# Patient Record
Sex: Male | Born: 1985 | Race: Asian | Hispanic: No | Marital: Single | Smoking: Former smoker
Health system: Southern US, Community
[De-identification: ages and names within clinical notes are randomized; demographics above are authoritative.]

## PROBLEM LIST (undated history)

## (undated) DIAGNOSIS — F32A Depression, unspecified: Secondary | ICD-10-CM

## (undated) DIAGNOSIS — R4182 Altered mental status, unspecified: Secondary | ICD-10-CM

## (undated) DIAGNOSIS — F329 Major depressive disorder, single episode, unspecified: Secondary | ICD-10-CM

## (undated) DIAGNOSIS — E119 Type 2 diabetes mellitus without complications: Secondary | ICD-10-CM

## (undated) DIAGNOSIS — F333 Major depressive disorder, recurrent, severe with psychotic symptoms: Secondary | ICD-10-CM

## (undated) DIAGNOSIS — R5381 Other malaise: Secondary | ICD-10-CM

## (undated) HISTORY — PX: EXPLORATORY LAPAROTOMY: SUR591

## (undated) HISTORY — PX: APPENDECTOMY: SHX54

---

## 2008-12-11 ENCOUNTER — Emergency Department (HOSPITAL_COMMUNITY): Admission: EM | Admit: 2008-12-11 | Discharge: 2008-12-11 | Payer: Self-pay | Admitting: Emergency Medicine

## 2008-12-11 ENCOUNTER — Emergency Department (HOSPITAL_COMMUNITY): Admission: EM | Admit: 2008-12-11 | Discharge: 2008-12-12 | Payer: Self-pay | Admitting: Emergency Medicine

## 2008-12-12 ENCOUNTER — Inpatient Hospital Stay (HOSPITAL_COMMUNITY): Admission: AD | Admit: 2008-12-12 | Discharge: 2008-12-31 | Payer: Self-pay | Admitting: *Deleted

## 2008-12-12 ENCOUNTER — Ambulatory Visit: Payer: Self-pay | Admitting: *Deleted

## 2010-07-08 LAB — DIFFERENTIAL
Eosinophils Relative: 2 % (ref 0–5)
Lymphocytes Relative: 16 % (ref 12–46)
Monocytes Absolute: 0.6 10*3/uL (ref 0.1–1.0)
Monocytes Relative: 6 % (ref 3–12)
Neutro Abs: 8 10*3/uL — ABNORMAL HIGH (ref 1.7–7.7)

## 2010-07-08 LAB — CBC
HCT: 36.3 % — ABNORMAL LOW (ref 39.0–52.0)
HCT: 42.4 % (ref 39.0–52.0)
Hemoglobin: 12.4 g/dL — ABNORMAL LOW (ref 13.0–17.0)
Hemoglobin: 14.4 g/dL (ref 13.0–17.0)
MCHC: 33.9 g/dL (ref 30.0–36.0)
RBC: 3.97 MIL/uL — ABNORMAL LOW (ref 4.22–5.81)
RBC: 4.64 MIL/uL (ref 4.22–5.81)
RDW: 12 % (ref 11.5–15.5)
RDW: 12.4 % (ref 11.5–15.5)
WBC: 7.2 10*3/uL (ref 4.0–10.5)

## 2010-07-08 LAB — POCT I-STAT, CHEM 8
BUN: 11 mg/dL (ref 6–23)
Calcium, Ion: 1.21 mmol/L (ref 1.12–1.32)
Chloride: 104 mEq/L (ref 96–112)
Glucose, Bld: 68 mg/dL — ABNORMAL LOW (ref 70–99)
TCO2: 21 mmol/L (ref 0–100)

## 2010-07-08 LAB — RAPID URINE DRUG SCREEN, HOSP PERFORMED
Barbiturates: NOT DETECTED
Benzodiazepines: NOT DETECTED
Cocaine: NOT DETECTED

## 2010-07-08 LAB — TRICYCLICS SCREEN, URINE: TCA Scrn: NOT DETECTED

## 2010-07-08 LAB — TSH: TSH: 1.999 u[IU]/mL (ref 0.350–4.500)

## 2010-07-08 LAB — BASIC METABOLIC PANEL
Calcium: 8.6 mg/dL (ref 8.4–10.5)
GFR calc Af Amer: >60 mL/min (ref >60)
GFR calc non Af Amer: >60 mL/min (ref >60)
Glucose, Bld: 96 mg/dL (ref 70–99)
Potassium: 3.6 mEq/L (ref 3.5–5.1)
Sodium: 136 mEq/L (ref 135–145)

## 2010-07-08 LAB — T4, FREE: Free T4: 1.18 ng/dL (ref 0.80–1.80)

## 2010-07-08 LAB — ETHANOL: Alcohol, Ethyl (B): <5 mg/dL (ref 0–10)

## 2017-06-18 ENCOUNTER — Emergency Department (HOSPITAL_COMMUNITY)
Admission: EM | Admit: 2017-06-18 | Discharge: 2017-06-20 | Disposition: A | Discharge status: Other Acute Inpatient Hospital | Payer: Self-pay | Attending: Physician Assistant | Admitting: Physician Assistant

## 2017-06-18 ENCOUNTER — Encounter (HOSPITAL_COMMUNITY): Payer: Self-pay | Admitting: Emergency Medicine

## 2017-06-18 ENCOUNTER — Ambulatory Visit (HOSPITAL_COMMUNITY)
Admission: RE | Admit: 2017-06-18 | Discharge: 2017-06-18 | Disposition: A | Discharge status: Home / Self Care | Payer: Self-pay | Attending: Psychiatry | Admitting: Psychiatry

## 2017-06-18 ENCOUNTER — Inpatient Hospital Stay
Admission: EM | Admit: 2017-06-18 | Discharge: 2017-06-18 | Discharge status: Absent without leave | Payer: Self-pay | Source: Home / Self Care

## 2017-06-18 DIAGNOSIS — F333 Major depressive disorder, recurrent, severe with psychotic symptoms: Secondary | ICD-10-CM | POA: Diagnosis present

## 2017-06-18 DIAGNOSIS — F332 Major depressive disorder, recurrent severe without psychotic features: Secondary | ICD-10-CM | POA: Insufficient documentation

## 2017-06-18 DIAGNOSIS — R45851 Suicidal ideations: Secondary | ICD-10-CM | POA: Insufficient documentation

## 2017-06-18 DIAGNOSIS — Z008 Encounter for other general examination: Secondary | ICD-10-CM

## 2017-06-18 HISTORY — DX: Major depressive disorder, single episode, unspecified: F32.9

## 2017-06-18 HISTORY — DX: Depression, unspecified: F32.A

## 2017-06-18 LAB — CBC
HCT: 36.8 % — ABNORMAL LOW (ref 39.0–52.0)
Hemoglobin: 11.9 g/dL — ABNORMAL LOW (ref 13.0–17.0)
MCH: 30.9 pg (ref 26.0–34.0)
MCHC: 32.3 g/dL (ref 30.0–36.0)
MCV: 95.6 fL (ref 78.0–100.0)
Platelets: 203 10*3/uL (ref 150–400)
RBC: 3.85 MIL/uL — ABNORMAL LOW (ref 4.22–5.81)
RDW: 12 % (ref 11.5–15.5)
WBC: 6 10*3/uL (ref 4.0–10.5)

## 2017-06-18 LAB — COMPREHENSIVE METABOLIC PANEL
ALK PHOS: 69 U/L (ref 38–126)
ALT: 57 U/L (ref 17–63)
ANION GAP: 9 (ref 5–15)
AST: 39 U/L (ref 15–41)
Albumin: 3.7 g/dL (ref 3.5–5.0)
BILIRUBIN TOTAL: 0.2 mg/dL — AB (ref 0.3–1.2)
BUN: 9 mg/dL (ref 6–20)
CALCIUM: 9.3 mg/dL (ref 8.9–10.3)
CO2: 29 mmol/L (ref 22–32)
Chloride: 102 mmol/L (ref 101–111)
Creatinine, Ser: 0.77 mg/dL (ref 0.61–1.24)
GFR calc non Af Amer: >60 mL/min (ref >60)
Glucose, Bld: 95 mg/dL (ref 65–99)
Potassium: 3.7 mmol/L (ref 3.5–5.1)
Sodium: 140 mmol/L (ref 135–145)
TOTAL PROTEIN: 7.8 g/dL (ref 6.5–8.1)

## 2017-06-18 LAB — SALICYLATE LEVEL

## 2017-06-18 LAB — RAPID URINE DRUG SCREEN, HOSP PERFORMED
Amphetamines: NOT DETECTED
BARBITURATES: NOT DETECTED
Benzodiazepines: NOT DETECTED
COCAINE: NOT DETECTED
OPIATES: NOT DETECTED
Tetrahydrocannabinol: NOT DETECTED

## 2017-06-18 LAB — ACETAMINOPHEN LEVEL

## 2017-06-18 LAB — ETHANOL

## 2017-06-18 NOTE — ED Notes (Signed)
Patient requested fluid/food via English speaking family member. Also provided requested urine for labs.  Report to Vivi FernsAshley Strader RN.

## 2017-06-18 NOTE — BH Assessment (Signed)
Assessment Note  Garrett [name] is an 32 y.o. male present to [facility] as a walk-in accompanied by his pastor for worsening depression and auditory hallucinations. Due to language barrier information obtained is limited, patient primary language Garrett [name]. Patient spoke soft and with limited understanding of English and limited responses to questions. Tele-machine translator used during assessment. Patient terminated from his job with Garrett [name] Feb. 21, 2019. Since that time patient has displayed worsening depressive symptoms such as not decreased self care (not showering, body odor present) and increased sleep. Patient report "hearing voices in my head." On the assessment sheet patient circled suicidal thoughts and anxiety, however, when asked if he was suicidal patient denied. Patient denies homicidal ideation and visual hallucinations.   Per Garrett Kaufmann, NP, patient meets criteria for inpatient admission due to severe depressive symptoms and decreased ability to function normally.    Patient admitted inpatient [facility] 11/2008 with similar complaints. Patient is not on medications.   Diagnosis:  F33.2     Major depressive disorder, Recurrent episode, Severe  Past Medical History: No past medical history on file.   Family History: No family history on file.  Social History:  has no tobacco, alcohol, and drug history on file.  Additional Social History:  Alcohol / Drug Use Pain Medications: see MAR Prescriptions: see MAR Over the Counter: see MAR History of alcohol / drug use?: No history of alcohol / drug abuse  CIWA: CIWA-Ar BP: 106/78 Pulse Rate: 88 COWS:    Allergies: Allergies not on file  Home Medications:  (Not in a hospital admission)  OB/GYN Status:  No LMP for male patient.  General Assessment Data Location of Assessment: [facility] TTS Assessment: In system Is this a Tele or Face-to-Face Assessment?: Face-to-Face Is this an Initial Assessment or a Re-assessment for this  encounter?: Initial Assessment Marital status: Single Living Arrangements: Non-relatives/Friends(live with friends) Can pt return to current living arrangement?: Yes Admission Status: Voluntary Is patient capable of signing voluntary admission?: Yes Referral Source: Self/Family/Friend Insurance type: self-pay  Medical Screening Exam [facility] Walk-in ONLY) Medical Exam completed: Yes  Crisis Care Plan Living Arrangements: Non-relatives/Friends(live with friends) Legal Guardian: Other:(self) Name of Psychiatrist: none report Name of Therapist: none report  Education Status Is patient currently in school?: No Is the patient employed, unemployed or receiving disability?: Unemployed(pt terminated from employment Feb. 21, 2019 ([employer]))  Risk to self with the past 6 months Suicidal Ideation: Yes-Currently Present Has patient been a risk to self within the past 6 months prior to admission? : No Suicidal Intent: No Has patient had any suicidal intent within the past 6 months prior to admission? : No Is patient at risk for suicide?: No Suicidal Plan?: No Has patient had any suicidal plan within the past 6 months prior to admission? : No Access to Means: No What has been your use of drugs/alcohol within the last 12 months?: denies Previous Attempts/Gestures: No How many times?: 0 Other Self Harm Risks: none report Triggers for Past Attempts: Unknown Intentional Self Injurious Behavior: None Family Suicide History: Unable to assess Recent stressful life event(s): (terminated from employment 05/24/2017) Persecutory voices/beliefs?: Yes Depression: Yes Depression Symptoms: Despondent, Loss of interest in usual pleasures, Insomnia Substance abuse history and/or treatment for substance abuse?: Yes Suicide prevention information given to non-admitted patients: Not applicable  Risk to Others within the past 6 months Homicidal Ideation: No Does patient have any lifetime risk of violence  toward others beyond the six months prior to admission? :  No Thoughts of Harm to Others: No Current Homicidal Intent: No Current Homicidal Plan: No Access to Homicidal Means: No Identified Victim: n/ History of harm to others?: No Assessment of Violence: None Noted Violent Behavior Description: none noted Does patient have access to weapons?: No Criminal Charges Pending?: No Does patient have a court date: No Is patient on probation?: No  Psychosis Hallucinations: Auditory Delusions: None noted  Mental Status Report Appearance/Hygiene: Disheveled Eye Contact: Poor Motor Activity: Freedom of movement Speech: Soft(language barrier) Level of Consciousness: Alert Mood: Depressed(sad affect) Affect: Depressed Anxiety Level: None Thought Processes: Unable to Assess Judgement: Unable to Assess Orientation: Person, Place Obsessive Compulsive Thoughts/Behaviors: Unable to Assess  Cognitive Functioning Concentration: Unable to Assess Memory: Unable to Assess Is patient IDD: No Is patient DD?: No Insight: Poor Impulse Control: Unable to Assess Appetite: Poor Have you had any weight changes? : No Change Sleep: Increased Vegetative Symptoms: Staying in bed  ADLScreening [facility] Assessment Services) Patient's cognitive ability adequate to safely complete daily activities?: Yes Patient able to express need for assistance with ADLs?: Yes Independently performs ADLs?: Yes (appropriate for developmental age)  Prior Inpatient Therapy Prior Inpatient Therapy: Yes Prior Therapy Dates: 11/2008 [facility]  Reason for Treatment: depression, SI   Prior Outpatient Therapy Prior Outpatient Therapy: No Does patient have an ACCT team?: No Does patient have Intensive In-House Services?  : No Does patient have [facility] services? : No Does patient have P4CC services?: No  ADL Screening (condition at time of admission) Patient's cognitive ability  adequate to safely complete daily activities?: Yes Is the patient deaf or have difficulty hearing?: No Does the patient have difficulty seeing, even when wearing glasses/contacts?: No Does the patient have difficulty concentrating, remembering, or making decisions?: No Patient able to express need for assistance with ADLs?: Yes Does the patient have difficulty dressing or bathing?: No Independently performs ADLs?: Yes (appropriate for developmental age) Does the patient have difficulty walking or climbing stairs?: No       Abuse/Neglect Assessment (Assessment to be complete while patient is alone) Abuse/Neglect Assessment Can Be Completed: Unable to assess, patient is non-responsive or altered mental status     Advance Directives (For Healthcare) Does Patient Have a Medical Advance Directive?: No Would patient like information on creating a medical advance directive?: No - Patient declined    Additional Information 1:1 In Past 12 Months?: No CIRT Risk: No Elopement Risk: No Does patient have medical clearance?: No     Disposition:  Disposition Initial Assessment Completed for this Encounter: Yes Disposition of Patient: Admit, Transfer(Davis, NP, [facility] tx[facility] Type of inpatient treatment program: Adult Patient refused recommended treatment: No Mode of transportation if patient is discharged?: Car  On Site Evaluation by:   Reviewed with Physician:    Dian Situelvondria Twila Rappa 06/18/2017 12:21 PM

## 2017-06-18 NOTE — ED Triage Notes (Signed)
**  Must use [employer] [name] language interpreter. Pt brought in by EMS, pt here for medical clearance, states he wants medication but unable to state which meds he is seeking. Pt unable to recall last time he has eaten, unsure of meds, unsure why he is here and difficulty obtaining information.

## 2017-06-18 NOTE — ED Notes (Addendum)
Per chart review, [facility]  Psych NP determined Pt meets IP criteria.  Notes state Pt has increasing depression since getting fired in February and reports visual and auditory hallucinations.

## 2017-06-18 NOTE — ED Notes (Signed)
Pt admitted to room #40. Wallee Burmese interpretor used for assessment. Pt reports "I'm not well, I'm very anxious." Pt denies SI/HI. Pt reports hearing voices "sometimes" pt reports he currently is not prescribed medication. Pt informs this nurse food preference is rice.  Encouragement and support provided. Special checks q 15 mins in place for safety, video monitoring in place. Will continue to monitor.

## 2017-06-18 NOTE — ED Notes (Signed)
SBAR Report received from previous nurse that pt speaks some english but when he is having an episode, he regresses back to [place] . Pt received calm and awake in bed conversing with beings unseen. Pt gave no meaningful answers to assessment questions related to current SI/ HI, A/V H, depression, anxiety, or pain at this time, but appears otherwise stable and free of distress. Pt reminded of camera surveillance, q 15 min rounds, and rules of the milieu. Will continue to assess.

## 2017-06-18 NOTE — H&P (Signed)
Behavioral Health Medical Screening Exam  Garrett [name] is an 32 y.o. male who presented as a walk in with his Garrett [name] due to severe depressive symptoms. Patient quit his job a month ago. He has not been showering or taking care of his hygiene. Patient reports that he has been "hearing voices in my head." Patient appears severely depressed with body odor that is present. He meets criteria for inpatient admission due to severe depressive symptoms and decreased ability to function normally. He was sent to the ED for medical clearance.   Total Time spent with patient: 20 minutes  Psychiatric Specialty Exam: Physical Exam  ROS  Blood pressure 106/78, pulse 88, temperature 98.3 F (36.8 C), SpO2 100 %.There is no height or weight on file to calculate BMI.  General Appearance: Disheveled  Eye Contact:  Minimal  Speech:  Blocked  Volume:  Decreased  Mood:  Dysphoric  Affect:  Constricted  Thought Process:  Coherent  Orientation:  Full (Time, Place, and Person)  Thought Content:  Hallucinations: Auditory  Suicidal Thoughts:  Yes.  with intent/plan  Homicidal Thoughts:  No  Memory:  Immediate;   Fair Recent;   Poor Remote;   Poor  Judgement:  Poor  Insight:  Lacking  Psychomotor Activity:  Decreased  Concentration: Concentration: Poor and Attention Span: Poor  Recall:  Poor  Fund of Knowledge:Fair  Language: Fair  Akathisia:  No  Handed:  Right  AIMS (if indicated):     Assets:  Communication Skills Desire for Improvement Physical Health Resilience Social Support  Sleep:       Musculoskeletal: Strength & Muscle Tone: within normal limits Gait & Station: normal Patient leans: N/A  Blood pressure 106/78, pulse 88, temperature 98.3 F (36.8 C), SpO2 100 %.  Recommendations:  Based on my evaluation the patient does not appear to have an emergency medical condition.  Fransisca KaufmannAVIS, Diego Delancey, NP 06/18/2017, 12:06 PM

## 2017-06-18 NOTE — ED Provider Notes (Signed)
[facility]-EMERGENCY DEPT Provider Note   CSN: [id number] Arrival date & time: 06/18/17  1308     History   Chief Complaint Chief Complaint  Patient presents with  . Medical Clearance   History is obtained using [employer] interpreter in [name] language  HPI Garrett [name] is a 32 y.o. male with a past medical history significant for depression who was sent over to the emergency department today for medical clearance.  Patient is very soft-spoken and does not provide thorough history.  He presented to behavioral health today for depression and auditory hallucinations.  He notes that he has had depressive symptoms over the last 1 month as well as decreased self-care and increased sleep.  He reports that he has not showered and is very self-conscious of his body odor.  Patient reported the circle of suicidal thoughts and anxiety on the assessment sheet of behavioral health denies these verbally to myself.  He denies any homicidal ideation.  Patient denies any daily medications, alcohol or drug use.  Patient was admitted to behavioral health in 2010 with similar complaints.  He denies any physical complaints at this time.  HPI  Past Medical History:  Diagnosis Date  . Depression     There are no active problems to display for this patient.   History reviewed. No pertinent surgical history.     Home Medications    Prior to Admission medications   Not on File    Family History History reviewed. No pertinent family history.  Social History Social History   Tobacco Use  . Smoking status: Not on file  Substance Use Topics  . Alcohol use: Not on file  . Drug use: Not on file     Allergies   Patient has no known allergies.   Review of Systems Review of Systems  All other systems reviewed and are negative.    Physical Exam Updated Vital Signs BP 117/89 (BP Location: Right Arm)   Pulse 86   Temp 98.8 F (37.1 C) (Oral)   Resp 16   Ht 5\' 2"  (1.575  m)   Wt 59 kg (130 lb)   SpO2 100%   BMI 23.78 kg/m   Physical Exam  Constitutional: He appears well-developed and well-nourished.  HENT:  Head: Normocephalic and atraumatic.  Right Ear: External ear normal.  Left Ear: External ear normal.  Nose: Nose normal.  Mouth/Throat: Uvula is midline, oropharynx is clear and moist and mucous membranes are normal. No tonsillar exudate.  Eyes: Pupils are equal, round, and reactive to light. Right eye exhibits no discharge. Left eye exhibits no discharge. No scleral icterus.  Neck: Trachea normal. Neck supple. No spinous process tenderness present. No neck rigidity. Normal range of motion present.  Cardiovascular: Normal rate, regular rhythm and intact distal pulses.  No murmur heard. Pulses:      Radial pulses are 2+ on the right side, and 2+ on the left side.       Dorsalis pedis pulses are 2+ on the right side, and 2+ on the left side.       Posterior tibial pulses are 2+ on the right side, and 2+ on the left side.  No lower extremity swelling or edema. Calves symmetric in size bilaterally.  Pulmonary/Chest: Effort normal and breath sounds normal. He exhibits no tenderness.  Abdominal: Soft. Bowel sounds are normal. He exhibits no distension. There is no tenderness. There is no rebound and no guarding.  Musculoskeletal: He exhibits no edema.  Lymphadenopathy:  He has no cervical adenopathy.  Neurological: He is alert.  Skin: Skin is warm and dry. No rash noted. He is not diaphoretic.  Psychiatric: He has a normal mood and affect.  Nursing note and vitals reviewed.    ED Treatments / Results  Labs (all labs ordered are listed, but only abnormal results are displayed) Labs Reviewed  COMPREHENSIVE METABOLIC PANEL - Abnormal; Notable for the following components:      Result Value   Total Bilirubin 0.2 (*)    All other components within normal limits  CBC - Abnormal; Notable for the following components:   RBC 3.85 (*)    Hemoglobin  11.9 (*)    HCT 36.8 (*)    All other components within normal limits  ETHANOL  SALICYLATE LEVEL  ACETAMINOPHEN LEVEL  RAPID URINE DRUG SCREEN, HOSP PERFORMED    EKG  EKG Interpretation None       Radiology No results found.  Procedures Procedures (including critical care time)  Medications Ordered in ED Medications - No data to display   Initial Impression / Assessment and Plan / ED Course  I have reviewed the triage vital signs and the nursing notes.  Pertinent labs & imaging results that were available during my care of the patient were reviewed by me and considered in my medical decision making (see chart for details).     Pt presents to the ED for medical clearance.  He was seen at behavioral health today with reported suicidal thoughts circled on assessment she.  He denies this to myself.  No homicidal ideation.  Patient has not only daily medications, and denies alcohol or drug use.  The patients demeanor is dysphoric. Admits difficulty sleeping, loss of interests, and increased sadness.  The patient currently does not have any acute physical complaints and is in no acute distress. Patient will be placed in psych hold. Screening labs ordered. They are reassuring. He is medically cleared. No home medications to re-order.   Final Clinical Impressions(s) / ED Diagnoses   Final diagnoses:  Encounter for medical clearance for patient hold    ED Discharge Orders    None       Jacinto HalimMaczis, Zacheriah Stumpe M, Cordelia Poche-C 06/18/17 1710    Abelino DerrickMackuen, Courteney Lyn, MD 06/24/17 [phone]

## 2017-06-18 NOTE — ED Notes (Signed)
Bed: W[facility]40 Expected date:  Expected time:  Means of arrival:  Comments: Hold for triage 4 

## 2017-06-19 DIAGNOSIS — F333 Major depressive disorder, recurrent, severe with psychotic symptoms: Secondary | ICD-10-CM | POA: Diagnosis present

## 2017-06-19 NOTE — Consult Note (Addendum)
[facility] Face-to-Face Psychiatry Consult   Reason for Consult:  Severe depression and psychosis Referring Physician:  EDP Patient Identification: Zyren Sevigny MRN:  [id number] Principal Diagnosis: MDD (major depressive disorder), recurrent, severe, with psychosis ([place]) Diagnosis:   Patient Active Problem List   Diagnosis Date Noted  . MDD (major depressive disorder), recurrent, severe, with psychosis ([place]) [F33.3] 06/19/2017    Total Time spent with patient: 45 minutes  Subjective:   Georgio Nordby is a 32 y.o. male patient admitted with severe depression and psychosis.   HPI: Assessment performed with Wall-E, Pt speaks [name] and is from [place]. Pt was seen and chart reviewed with treatment team and Dr Mariea Clonts. Pt denies suicidal/homicidal ideation, denies auditory/visual hallucinations. Pt is responding to internal stimuli. Pt presented to [facility] as a walk in due to increased depression and anxiety since losing his job on 05-24-17. Pt answers most questions with "I don't remember." Pt is guarded with thought blocking. Pt has been unable to take care of his personal hygiene and is hypersomnolent. Pt would benefit from an inpatient psychiatric admission for crisis stabilization and medication management.   Past Psychiatric History: As above  Risk to Self: Is patient at risk for suicide?: No, but patient needs Medical Clearance Risk to Others:  None. Denies HI.  Prior Inpatient Therapy:  Reports that he is unsure if previously hospitalized.  Prior Outpatient Therapy:  Denies   Past Medical History:  Past Medical History:  Diagnosis Date  . Depression    History reviewed. No pertinent surgical history. Family History: History reviewed. No pertinent family history. Family Psychiatric  History: Denies Social History:  Social History   Substance and Sexual Activity  Alcohol Use Not on file     Social History   Substance and Sexual Activity  Drug Use Not on file    Social History   Socioeconomic  History  . Marital status: Single    Spouse name: None  . Number of children: None  . Years of education: None  . Highest education level: None  Social Needs  . Financial resource strain: None  . Food insecurity - worry: None  . Food insecurity - inability: None  . Transportation needs - medical: None  . Transportation needs - non-medical: None  Occupational History  . None  Tobacco Use  . Smoking status: None  Substance and Sexual Activity  . Alcohol use: None  . Drug use: None  . Sexual activity: None  Other Topics Concern  . None  Social History Narrative  . None   Additional Social History: N/A    Allergies:  No Known Allergies  Labs:  Results for orders placed or performed during the hospital encounter of 06/18/17 (from the past 48 hour(s))  Comprehensive metabolic panel     Status: Abnormal   Collection Time: 06/18/17  2:30 PM  Result Value Ref Range   Sodium 140 135 - 145 mmol/L   Potassium 3.7 3.5 - 5.1 mmol/L   Chloride 102 101 - 111 mmol/L   CO2 29 22 - 32 mmol/L   Glucose, Bld 95 65 - 99 mg/dL   BUN 9 6 - 20 mg/dL   Creatinine, Ser 0.77 0.61 - 1.24 mg/dL   Calcium 9.3 8.9 - 10.3 mg/dL   Total Protein 7.8 6.5 - 8.1 g/dL   Albumin 3.7 3.5 - 5.0 g/dL   AST 39 15 - 41 U/L   ALT 57 17 - 63 U/L   Alkaline Phosphatase 69 38 - 126 U/L  Total Bilirubin 0.2 (L) 0.3 - 1.2 mg/dL   GFR calc non Af Amer >60 >60 mL/min   GFR calc Af Amer >60 >60 mL/min    Comment: (NOTE) The eGFR has been calculated using the CKD EPI equation. This calculation has not been validated in all clinical situations. eGFR's persistently <60 mL/min signify possible Chronic Kidney Disease.    Anion gap 9 5 - 15    Comment: Performed at [facility], [place] [address] [place], [place] [address]  Ethanol     Status: None   Collection Time: 06/18/17  2:30 PM  Result Value Ref Range   Alcohol, Ethyl (B) <10 <10 mg/dL    Comment:        LOWEST DETECTABLE LIMIT  FOR SERUM ALCOHOL IS 10 mg/dL FOR MEDICAL PURPOSES ONLY Performed at [facility], [place] [address], [place], [place] [address]   Salicylate level     Status: None   Collection Time: 06/18/17  2:30 PM  Result Value Ref Range   Salicylate Lvl <6.5 2.8 - 30.0 mg/dL    Comment: Performed at [facility], [place] [address] [place], [place] [address]  Acetaminophen level     Status: Abnormal   Collection Time: 06/18/17  2:30 PM  Result Value Ref Range   Acetaminophen (Tylenol), Serum <10 (L) 10 - 30 ug/mL    Comment:        THERAPEUTIC CONCENTRATIONS VARY SIGNIFICANTLY. A RANGE OF 10-30 ug/mL MAY BE AN EFFECTIVE CONCENTRATION FOR MANY PATIENTS. HOWEVER, SOME ARE BEST TREATED AT CONCENTRATIONS OUTSIDE THIS RANGE. ACETAMINOPHEN CONCENTRATIONS >150 ug/mL AT 4 HOURS AFTER INGESTION AND >50 ug/mL AT 12 HOURS AFTER INGESTION ARE OFTEN ASSOCIATED WITH TOXIC REACTIONS. Performed at [facility], [place] [address] [place], [place] [address]   cbc     Status: Abnormal   Collection Time: 06/18/17  2:30 PM  Result Value Ref Range   WBC 6.0 4.0 - 10.5 K/uL   RBC 3.85 (L) 4.22 - 5.81 MIL/uL   Hemoglobin 11.9 (L) 13.0 - 17.0 g/dL   HCT 36.8 (L) 39.0 - 52.0 %   MCV 95.6 78.0 - 100.0 fL   MCH 30.9 26.0 - 34.0 pg   MCHC 32.3 30.0 - 36.0 g/dL   RDW 12.0 11.5 - 15.5 %   Platelets 203 150 - 400 K/uL    Comment: Performed at [facility], [place] [address] [place], [place] [address]  Rapid urine drug screen (hospital performed)     Status: None   Collection Time: 06/18/17  4:43 PM  Result Value Ref Range   Opiates NONE DETECTED NONE DETECTED   Cocaine NONE DETECTED NONE DETECTED   Benzodiazepines NONE DETECTED NONE DETECTED   Amphetamines NONE DETECTED NONE DETECTED   Tetrahydrocannabinol NONE DETECTED NONE DETECTED   Barbiturates NONE DETECTED NONE DETECTED    Comment: (NOTE) DRUG SCREEN FOR MEDICAL PURPOSES ONLY.  IF  CONFIRMATION IS NEEDED FOR ANY PURPOSE, NOTIFY LAB WITHIN 5 DAYS. LOWEST DETECTABLE LIMITS FOR URINE DRUG SCREEN Drug Class                     Cutoff (ng/mL) Amphetamine and metabolites    1000 Barbiturate and metabolites    200 Benzodiazepine                 920 Tricyclics and metabolites     300 Opiates and metabolites        300 Cocaine  and metabolites        300 THC                            50 Performed at [facility], [place] [address] [place], [place] [address]     No current facility-administered medications for this encounter.    No current outpatient medications on file.    Musculoskeletal: Strength & Muscle Tone: within normal limits Gait & Station: normal Patient leans: N/A  Psychiatric Specialty Exam: Physical Exam  Nursing note and vitals reviewed. Constitutional: He appears well-developed and well-nourished.  HENT:  Head: Normocephalic.  Neck: Normal range of motion.  Respiratory: Effort normal.  Musculoskeletal: Normal range of motion.  Neurological: He is alert.  Psychiatric: His speech is normal and behavior is normal. Judgment and thought content normal. Cognition and memory are impaired. He exhibits a depressed mood.    Review of Systems  Psychiatric/Behavioral: Positive for depression. Negative for hallucinations, substance abuse and suicidal ideas.  All other systems reviewed and are negative.   Blood pressure 94/63, pulse 76, temperature 97.6 F (36.4 C), resp. rate 16, height _0  (1.575 m), weight 59 kg (130 lb), SpO2 100 %.Body mass index is 23.78 kg/m.  General Appearance: Casual  Eye Contact:  Fair  Speech:  speaks [name] Burmese  Volume:  Decreased  Mood:  Depressed and Dysphoric  Affect:  Congruent, Depressed and Flat  Thought Process:  Coherent  Orientation:  Full (Time, Place, and Person)  Thought Content:  Rumination  Suicidal Thoughts:  No  Homicidal Thoughts:  No  Memory:  Immediate;   Fair Recent;    Fair Remote;   Poor  Judgement:  Impaired  Insight:  Shallow  Psychomotor Activity:  Decreased  Concentration:  Concentration: Fair and Attention Span: Fair  Recall:  [employer] of Knowledge:  Fair  Language:  speaks Burmese  Akathisia:  No  Handed:  Right  AIMS (if indicated):   N/A  Assets:  Chief Executive Officer Social Support  ADL's:  Intact  Cognition:  WNL  Sleep:   Okay     Treatment Plan Summary: Daily contact with patient to assess and evaluate symptoms and progress in treatment and Medication management (see MAR)  Disposition: Recommend psychiatric Inpatient admission when medically cleared. TTS to seek placement  Ethelene Hal, NP 06/19/2017 2:42 PM   Patient seen face-to-face for psychiatric evaluation, chart reviewed and case discussed with the physician extender and developed treatment plan. Reviewed the information documented and agree with the treatment plan.  Buford Dresser, DO 06/19/17 10:42 PM

## 2017-06-19 NOTE — ED Notes (Signed)
Patient ate 40% of lunch.

## 2017-06-19 NOTE — ED Notes (Signed)
Informed by Tarri Glennheressa MHT pt ate 75% of breakfast

## 2017-06-19 NOTE — Progress Notes (Signed)
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 Pt affect is sad. Pt asked if he neded anything or if there was anything the nurse can do for him. Pt responded, "No." Pt's dinner a bedside at 1800, but pt has not eaten anything at the entry of this note. Pt denies any pain, SI, HI and AVH. Q15 minute safety checks in effect for safety of pt.

## 2017-06-19 NOTE — Progress Notes (Signed)
Wall-E language interpreter used (Burmese). Pt reported, "I'm getting better." Pt denied SI, and endorsed auditory hallucinations. Odor noted from patient in room. Pt offered a shower and clean scrubs. Pt stated that he is not sure if he wants to take a shower yet. 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 Orange juice provided for pt. Encouragement provided. Waiting for Doctor and Psych team to talk with pt. Q15 minute safety checks in effect for pt's safety.

## 2017-06-19 NOTE — Progress Notes (Signed)
Student nurse in room with pt, doctor, and psych team. Pt's affect is sad. Wall-E interpretor used (Burmese). Pt responded, "I don't know," and "I don't remember" to majority of the questions asked by the doctor. Q15 minute safety checks in effect for pt's safety.

## 2017-06-20 ENCOUNTER — Encounter (HOSPITAL_COMMUNITY): Payer: Self-pay | Admitting: *Deleted

## 2017-06-20 ENCOUNTER — Inpatient Hospital Stay (HOSPITAL_COMMUNITY)
Admission: AD | Admit: 2017-06-20 | Discharge: 2017-07-05 | DRG: 885 | Disposition: A | Discharge status: Home / Self Care | Payer: No Typology Code available for payment source | Source: Intra-hospital | Attending: Psychiatry | Admitting: Psychiatry

## 2017-06-20 ENCOUNTER — Other Ambulatory Visit: Payer: Self-pay

## 2017-06-20 DIAGNOSIS — G4700 Insomnia, unspecified: Secondary | ICD-10-CM | POA: Diagnosis not present

## 2017-06-20 DIAGNOSIS — R51 Headache: Secondary | ICD-10-CM | POA: Diagnosis not present

## 2017-06-20 DIAGNOSIS — F332 Major depressive disorder, recurrent severe without psychotic features: Secondary | ICD-10-CM

## 2017-06-20 DIAGNOSIS — R45851 Suicidal ideations: Secondary | ICD-10-CM

## 2017-06-20 DIAGNOSIS — R450 Nervousness: Secondary | ICD-10-CM | POA: Diagnosis not present

## 2017-06-20 DIAGNOSIS — F333 Major depressive disorder, recurrent, severe with psychotic symptoms: Secondary | ICD-10-CM | POA: Diagnosis present

## 2017-06-20 DIAGNOSIS — M545 Low back pain: Secondary | ICD-10-CM | POA: Diagnosis not present

## 2017-06-20 DIAGNOSIS — M549 Dorsalgia, unspecified: Secondary | ICD-10-CM | POA: Diagnosis not present

## 2017-06-20 DIAGNOSIS — F419 Anxiety disorder, unspecified: Secondary | ICD-10-CM | POA: Diagnosis present

## 2017-06-20 HISTORY — DX: Major depressive disorder, recurrent severe without psychotic features: F33.2

## 2017-06-20 MED ORDER — RISPERIDONE 0.25 MG PO TABS
0.2500 mg | ORAL_TABLET | Freq: Two times a day (BID) | ORAL | Status: DC
  Administered 2017-06-20 – 2017-06-21 (×2): 0.25 mg via ORAL
  Filled 2017-06-20 (×6): qty 1

## 2017-06-20 MED ORDER — ACETAMINOPHEN 325 MG PO TABS
650.0000 mg | ORAL_TABLET | Freq: Four times a day (QID) | ORAL | Status: DC | PRN
  Administered 2017-06-22: 650 mg via ORAL
  Filled 2017-06-20: qty 2

## 2017-06-20 MED ORDER — LORAZEPAM 1 MG PO TABS
1.0000 mg | ORAL_TABLET | Freq: Once | ORAL | Status: AC
  Administered 2017-06-20: 1 mg via ORAL
  Filled 2017-06-20: qty 1

## 2017-06-20 MED ORDER — RISPERIDONE 0.5 MG PO TABS
0.2500 mg | ORAL_TABLET | Freq: Two times a day (BID) | ORAL | Status: DC
  Administered 2017-06-20: 0.25 mg via ORAL
  Filled 2017-06-20: qty 1

## 2017-06-20 MED ORDER — ALUM & MAG HYDROXIDE-SIMETH 200-200-20 MG/5ML PO SUSP: 30.0000 mL | ORAL | Status: DC | PRN

## 2017-06-20 MED ORDER — MAGNESIUM HYDROXIDE 400 MG/5ML PO SUSP: 30.0000 mL | Freq: Every day | ORAL | Status: DC | PRN

## 2017-06-20 MED ORDER — ESCITALOPRAM OXALATE 5 MG PO TABS
5.0000 mg | ORAL_TABLET | Freq: Every day | ORAL | Status: DC
  Administered 2017-06-21 – 2017-06-23 (×3): 5 mg via ORAL
  Filled 2017-06-20 (×5): qty 1

## 2017-06-20 MED ORDER — ESCITALOPRAM OXALATE 10 MG PO TABS
5.0000 mg | ORAL_TABLET | Freq: Every day | ORAL | Status: DC
  Administered 2017-06-20: 5 mg via ORAL
  Filled 2017-06-20: qty 1

## 2017-06-20 NOTE — ED Notes (Signed)
Patient continues to be minimally verbal and isolative to room.  Compliant with scheduled medications and too fluids with encouragement.  Support offered and 15' checks cont for safety.

## 2017-06-20 NOTE — ED Notes (Signed)
Patient has not interacted with staff during the entire shift. Patient has slept the entire shift. Patient has only be awake during initial shift assessment and during the collection of vital signs.

## 2017-06-20 NOTE — Tx Team (Signed)
Initial Treatment Plan 06/20/2017 8:33 PM Garrett [name] ZOX:[id number]    PATIENT STRESSORS: Occupational concerns   PATIENT STRENGTHS: Capable of independent living Motivation for treatment/growth Supportive family/friends (pastor)   PATIENT IDENTIFIED PROBLEMS: Alteration in thought process "I hear the voices".  Alteration in mood (depression)  Alteration in sleep pattern (increased sleep)                 DISCHARGE CRITERIA:  Improved stabilization in mood, thinking, and/or behavior Motivation to continue treatment in a less acute level of care Safe-care adequate arrangements made  PRELIMINARY DISCHARGE PLAN: Outpatient therapy Return to previous living arrangement  PATIENT/FAMILY INVOLVEMENT: This treatment plan has been presented to and reviewed with the patient, Garrett [name]. The patient have been given the opportunity to ask questions and make suggestions.  Sherryl MangesWesseh, [name], RN 06/20/2017, 8:33 PM

## 2017-06-20 NOTE — Consult Note (Addendum)
[facility] Face-to-Face Psychiatry Consult   Reason for Consult:  Depression with psychosis Referring Physician:  EDP Patient Identification: Garrett [name] MRN:  [id number] Principal Diagnosis: MDD (major depressive disorder), recurrent, severe, with psychosis ([place]) Diagnosis:   Patient Active Problem List   Diagnosis Date Noted  . MDD (major depressive disorder), recurrent, severe, with psychosis ([place]) [F33.3] 06/19/2017    Priority: High    Total Time spent with patient: 30 minutes  Subjective:   Garrett [name] is a 32 y.o. male patient admitted with depression with psychosis.  HPI:  32 yo male presented with depression and hallucinations. He recently lost his job and his depression started.  He has not been bathing or eating much, increase in sleep. Initially he was hearing voices but today he denies this but does continue to endorse suicidal ideations. No homicidal ideations or substance abuse.  Past Psychiatric History: depression  Risk to Self: Is patient at risk for suicide?: No, but patient needs Medical Clearance Risk to Others:  None. Denies HI.  Prior Inpatient Therapy:  Patient unsure if he has been previously hospitalized.  Prior Outpatient Therapy:  Denies   Past Medical History:  Past Medical History:  Diagnosis Date  . Depression    History reviewed. No pertinent surgical history. Family History: History reviewed. No pertinent family history. Family Psychiatric  History: none Social History:  Social History   Substance and Sexual Activity  Alcohol Use Not on file     Social History   Substance and Sexual Activity  Drug Use Not on file    Social History   Socioeconomic History  . Marital status: Single    Spouse name: None  . Number of children: None  . Years of education: None  . Highest education level: None  Social Needs  . Financial resource strain: None  . Food insecurity - worry: None  . Food insecurity - inability: None  . Transportation needs - medical:  None  . Transportation needs - non-medical: None  Occupational History  . None  Tobacco Use  . Smoking status: None  Substance and Sexual Activity  . Alcohol use: None  . Drug use: None  . Sexual activity: None  Other Topics Concern  . None  Social History Narrative  . None   Additional Social History: N/A    Allergies:  No Known Allergies  Labs:  Results for orders placed or performed during the hospital encounter of 06/18/17 (from the past 48 hour(s))  Comprehensive metabolic panel     Status: Abnormal   Collection Time: 06/18/17  2:30 PM  Result Value Ref Range   Sodium 140 135 - 145 mmol/L   Potassium 3.7 3.5 - 5.1 mmol/L   Chloride 102 101 - 111 mmol/L   CO2 29 22 - 32 mmol/L   Glucose, Bld 95 65 - 99 mg/dL   BUN 9 6 - 20 mg/dL   Creatinine, Ser 0.77 0.61 - 1.24 mg/dL   Calcium 9.3 8.9 - 10.3 mg/dL   Total Protein 7.8 6.5 - 8.1 g/dL   Albumin 3.7 3.5 - 5.0 g/dL   AST 39 15 - 41 U/L   ALT 57 17 - 63 U/L   Alkaline Phosphatase 69 38 - 126 U/L   Total Bilirubin 0.2 (L) 0.3 - 1.2 mg/dL   GFR calc non Af Amer >60 >60 mL/min   GFR calc Af Amer >60 >60 mL/min    Comment: (NOTE) The eGFR has been calculated using the CKD EPI equation. This  calculation has not been validated in all clinical situations. eGFR's persistently <60 mL/min signify possible Chronic Kidney Disease.    Anion gap 9 5 - 15    Comment: Performed at [facility], [place] [address], [place], [place] [address]  Ethanol     Status: None   Collection Time: 06/18/17  2:30 PM  Result Value Ref Range   Alcohol, Ethyl (B) <10 <10 mg/dL    Comment:        LOWEST DETECTABLE LIMIT FOR SERUM ALCOHOL IS 10 mg/dL FOR MEDICAL PURPOSES ONLY Performed at [facility], [place] [address] [place], [place] [address]   Salicylate level     Status: None   Collection Time: 06/18/17  2:30 PM  Result Value Ref Range   Salicylate Lvl <0.3 2.8 - 30.0 mg/dL    Comment: Performed at  [facility], [place] [address], [place], [place] [address]  Acetaminophen level     Status: Abnormal   Collection Time: 06/18/17  2:30 PM  Result Value Ref Range   Acetaminophen (Tylenol), Serum <10 (L) 10 - 30 ug/mL    Comment:        THERAPEUTIC CONCENTRATIONS VARY SIGNIFICANTLY. A RANGE OF 10-30 ug/mL MAY BE AN EFFECTIVE CONCENTRATION FOR MANY PATIENTS. HOWEVER, SOME ARE BEST TREATED AT CONCENTRATIONS OUTSIDE THIS RANGE. ACETAMINOPHEN CONCENTRATIONS >150 ug/mL AT 4 HOURS AFTER INGESTION AND >50 ug/mL AT 12 HOURS AFTER INGESTION ARE OFTEN ASSOCIATED WITH TOXIC REACTIONS. Performed at [facility], [place] [address], [place], [place] [address]   cbc     Status: Abnormal   Collection Time: 06/18/17  2:30 PM  Result Value Ref Range   WBC 6.0 4.0 - 10.5 K/uL   RBC 3.85 (L) 4.22 - 5.81 MIL/uL   Hemoglobin 11.9 (L) 13.0 - 17.0 g/dL   HCT 36.8 (L) 39.0 - 52.0 %   MCV 95.6 78.0 - 100.0 fL   MCH 30.9 26.0 - 34.0 pg   MCHC 32.3 30.0 - 36.0 g/dL   RDW 12.0 11.5 - 15.5 %   Platelets 203 150 - 400 K/uL    Comment: Performed at [facility], [place] [address], [place], [place] [address]  Rapid urine drug screen (hospital performed)     Status: None   Collection Time: 06/18/17  4:43 PM  Result Value Ref Range   Opiates NONE DETECTED NONE DETECTED   Cocaine NONE DETECTED NONE DETECTED   Benzodiazepines NONE DETECTED NONE DETECTED   Amphetamines NONE DETECTED NONE DETECTED   Tetrahydrocannabinol NONE DETECTED NONE DETECTED   Barbiturates NONE DETECTED NONE DETECTED    Comment: (NOTE) DRUG SCREEN FOR MEDICAL PURPOSES ONLY.  IF CONFIRMATION IS NEEDED FOR ANY PURPOSE, NOTIFY LAB WITHIN 5 DAYS. LOWEST DETECTABLE LIMITS FOR URINE DRUG SCREEN Drug Class                     Cutoff (ng/mL) Amphetamine and metabolites    1000 Barbiturate and metabolites    200 Benzodiazepine                 967 Tricyclics and metabolites     300 Opiates  and metabolites        300 Cocaine and metabolites        300 THC                            50 Performed at [facility]  [facility], [place] [address] [place], [place] [address]     Current Facility-Administered Medications  Medication Dose Route Frequency Provider Last Rate Last Dose  . escitalopram (LEXAPRO) tablet 5 mg  5 mg Oral Daily Faythe Dingwall, DO   5 mg at 06/20/17 1135  . risperiDONE (RISPERDAL) tablet 0.25 mg  0.25 mg Oral BID Faythe Dingwall, DO   0.25 mg at 06/20/17 1134   No current outpatient medications on file.    Musculoskeletal: Strength & Muscle Tone: within normal limits Gait & Station: normal Patient leans: N/A  Psychiatric Specialty Exam: Physical Exam  Nursing note and vitals reviewed. Constitutional: He is oriented to person, place, and time. He appears well-developed and well-nourished.  HENT:  Head: Normocephalic and atraumatic.  Neck: Normal range of motion.  Respiratory: Effort normal.  Musculoskeletal: Normal range of motion.  Neurological: He is alert and oriented to person, place, and time.  Psychiatric: His speech is normal and behavior is normal. Judgment normal. Cognition and memory are impaired. He exhibits a depressed mood. He expresses suicidal ideation. He expresses suicidal plans.    Review of Systems  Psychiatric/Behavioral: Positive for depression and suicidal ideas.  All other systems reviewed and are negative.   Blood pressure 100/62, pulse 79, temperature (!) 97.5 F (36.4 C), temperature source Oral, resp. rate 16, height 5' 2"  (1.575 m), weight 59 kg (130 lb), SpO2 99 %.Body mass index is 23.78 kg/m.  General Appearance: Disheveled  Eye Contact:  Minimal  Speech:  Normal Rate  Volume:  Decreased  Mood:  Depressed  Affect:  Congruent  Thought Process:  Coherent and Descriptions of Associations: Intact  Orientation:  Full (Time, Place, and Person)  Thought Content:  Rumination  Suicidal Thoughts:  Yes.   with intent/plan  Homicidal Thoughts:  No  Memory:  Immediate;   Fair Recent;   Fair Remote;   Fair  Judgement:  Impaired  Insight:  Fair  Psychomotor Activity:  Decreased  Concentration:  Concentration: Fair and Attention Span: Fair  Recall:  [employer] of Knowledge:  Fair  Language:  Fair  Akathisia:  No  Handed:  Right  AIMS (if indicated):   N/A  Assets:  Leisure Time Physical Health Resilience Social Support  ADL's:  Impaired  Cognition:  Impaired,  Mild  Sleep:   N/A     Treatment Plan Summary: Daily contact with patient to assess and evaluate symptoms and progress in treatment, Medication management and Plan major depressive disorder, recurrent, severe without psychosis:  -Crisis stabilization -Medication management:  Started Lexapro 5 mg daily for depression and Risperdal 0.25 mg BID for psychosis -Individual counseling  Disposition: Recommend psychiatric Inpatient admission when medically cleared.  Waylan Boga, NP 06/20/2017 12:28 PM   Patient seen face-to-face for psychiatric evaluation, chart reviewed and case discussed with the physician extender and developed treatment plan. Reviewed the information documented and agree with the treatment plan.  Buford Dresser, DO 06/20/17 4:01 PM

## 2017-06-20 NOTE — BH Assessment (Signed)
[facility]  Per Juanetta BeetsJacqueline Norman, DO, this pt requires psychiatric hospitalization at this time.  Malva LimesLinsey Strader, RN, [facility]; [facility] will be ready to receive pt at 17:00.  This Clinical research associatewriter spoke to pt about disposition, using the Wall-E interpreter device.  Pt has signed Voluntary Admission and Consent for Treatment, but did not respond to questions regarding Consent to Release Information.  [facility].  Pt's nurse, Carlisle BeersLuann, has been notified, and agrees to send original paperwork along with pt via Juel [place], and to call report to [phone].  Doylene Canninghomas Anab Vivar, [place] Behavioral Health Coordinator [phone]

## 2017-06-20 NOTE — Progress Notes (Addendum)
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. Per nursing report, pt recently lost his job at [employer], became depressed with increased sleep and +AH. Pt resents with flat affect, blank stare and thought blocking on interaction. Answered via head node or one word "yes, no" even with interpreter video. Denies SI, HI, VH, h/o abuse (sexual, physical & emotional) and pain "no, no". Endorsed +AH "yes" and would not elaborate. Introduced self to pt. Skin assessessment done and belongings searched as per protocol. Pt's skin is dry and intact. No contraband found in pt's belongings. Emotional support and availability provided to pt. Encouraged pt to voice concerns. Unit orientation done, routines discussed and care plan reviewed with pt. Writer unable to complete admission assessment due to pt being electively mute. Q 15 minutes safety checks initiated without self harm gestures or outburst thus far this shift.

## 2017-06-20 NOTE — ED Notes (Signed)
Patient given bottle water and encouraged to drink. Patient took a few sips of water and laid back down.

## 2017-06-21 MED ORDER — RISPERIDONE 1 MG PO TABS
1.0000 mg | ORAL_TABLET | Freq: Every day | ORAL | Status: DC
  Administered 2017-06-21: 1 mg via ORAL
  Filled 2017-06-21 (×3): qty 1

## 2017-06-21 MED ORDER — ENSURE ENLIVE PO LIQD
237.0000 mL | Freq: Two times a day (BID) | ORAL | Status: DC
  Administered 2017-06-21 – 2017-07-05 (×26): 237 mL via ORAL

## 2017-06-21 MED ORDER — LORAZEPAM 1 MG PO TABS
1.0000 mg | ORAL_TABLET | Freq: Three times a day (TID) | ORAL | Status: DC
  Administered 2017-06-21 – 2017-06-25 (×12): 1 mg via ORAL
  Filled 2017-06-21 (×12): qty 1

## 2017-06-21 MED ORDER — ADULT MULTIVITAMIN W/MINERALS CH
1.0000 | ORAL_TABLET | Freq: Every day | ORAL | Status: DC
  Administered 2017-06-21 – 2017-07-05 (×15): 1 via ORAL
  Filled 2017-06-21 (×18): qty 1

## 2017-06-21 NOTE — BHH Counselor (Signed)
Adult Comprehensive Assessment  Patient ID: Shirl HarrisHtee Aho, male   DOB: June 04, 1985, 32 y.o.   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, quantityinancial / Lack of resources (include bankruptcy): No income currently Housing / Lack of housing: lives with roomates  Living/Environment/Situation:  How long has patient lived in current situation?: many years What is atmosphere in current home: Comfortable  Family History:  Does patient have children?: No  Childhood History:  Additional childhood history information: Spent undetermined amount of time in refugee camp in [place] before coming to the [place]  Education:     Employment/Work Situation:   Employment situation: Unemployed Patient's job has been impacted by current illness: Yes Describe how patient's job has been impacted: was fired when he did not show up for work due to mental health symptoms What is the longest time patient has a held a job?: has had three jobs since his last hospitalization-has a high level of functionality when on meds Where was the patient employed at that time?: 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 was most recent Has patient ever been in the [employer] and Companymilitary?: No Are There Guns or Other Weapons in Your Home?: No  Financial Resources:   Financial resources: No income Does patient have a Lawyerrepresentative payee or guardian?: No  Alcohol/Substance Abuse:   What has been your use of drugs/alcohol within the last 12 months?: "Word on the street is that he does drink alcohol" Alcohol/Substance Abuse Treatment Hx: Denies past history Has alcohol/substance abuse ever caused legal problems?: No  Social Support System:   Conservation officer, natureatient's Community Support System: Good Describe Community Support System: Clydie BraunKaren community, including pastor, church family Type of  faith/religion: Ephriam KnucklesChristian How does patient's faith help to cope with current illness?: N/A  Leisure/Recreation:      Strengths/Needs:      Discharge Plan:   Does patient have access to transportation?: Yes Will patient be returning to same living situation after discharge?: Yes Currently receiving community mental health services: No If no, would patient like referral for services when discharged?: Yes (What county?)([place])  Summary/Recommendations:   Summary and Recommendations (to be completed by the evaluator): Phuc Magdalene PatriciaShee is a 32 YO [place] immigrant diagnosed with MDD, severe, recurrent with psychosis.  He presents voluntarily with disorganization, paranoia and selective mutism.  Leopold was hospitalized here 9 years ago with a similar presentation.  At d/c. he will return home and [facility]  While here, Khyrin can benefit from crises stabilization, medication management, therapeutic milieu and referral for services.  Ida Rogueodney B Gizzelle Lacomb. 06/21/2017

## 2017-06-21 NOTE — [facility] Suicide Risk Assessment (Signed)
[facility]   Nursing information obtained from:    Demographic factors:    Current Mental Status:    Loss Factors:    Historical Factors:    Risk Reduction Factors:     Total Time spent with patient: 1 hour Principal Problem: MDD (major depressive disorder), recurrent, severe, with psychosis (HCC) Diagnosis:   Patient Active Problem List   Diagnosis Date Noted  . Major depressive disorder, recurrent severe without psychotic features (HCC) [F33.2] 06/20/2017  . MDD (major depressive disorder), recurrent, severe, with psychosis (HCC) [F33.3] 06/19/2017   Subjective Data: See H&P for details  Continued Clinical Symptoms:  Alcohol Use Disorder Identification Test Final Score (AUDIT): 0 The "Alcohol Use Disorders Identification Test", Guidelines for Use in Primary Care, Second Edition.  World Science writerHealth Organization [facility](WHO). Score between 0-7:  no or low risk or alcohol related problems. Score between 8-15:  moderate risk of alcohol related problems. Score between 16-19:  high risk of alcohol related problems. Score 20 or above:  warrants further diagnostic evaluation for alcohol dependence and treatment.   CLINICAL FACTORS:   Severe Anxiety and/or Agitation Depression:   Severe     Psychiatric Specialty Exam: Physical Exam  Nursing note and vitals reviewed.   ROS - see H&P for details  Blood pressure 108/61, pulse 91, temperature 98.2 F (36.8 C), temperature source Oral, resp. rate 16, height 5\' 2"  (1.575 m), weight 53.1 kg (117 lb), SpO2 98 %.Body mass index is 21.4 kg/m.     COGNITIVE FEATURES THAT CONTRIBUTE TO RISK:  None    SUICIDE RISK:   Minimal: No identifiable suicidal ideation.  Patients presenting with no risk factors but with morbid ruminations; may be classified as minimal risk based on the severity of the depressive symptoms  PLAN OF CARE: See H&P for details  I certify that inpatient services furnished can reasonably be expected  to improve the patient's condition.   Micheal Likenshristopher T Treylin Burtch, MD 06/21/2017, 12:58 PM

## 2017-06-21 NOTE — Progress Notes (Signed)
D: Pt awake, visible in hall on initial presentation. Assessment done with via interpreter. Pt denies SI, HI, AVH and pain. Appears to be thought blocking, delayed responses. Gait is steady but slow. Pt went to group briefly but left. A: Introduced self and reviewed unit routines with pt. Emotional support provided. Encouraged pt to voice concerns. Scheduled medications given per MD's orders with verbal education and effects monitor. Safety checks continues at Q 15 minutes intervals without self harm gestures or outburst at this time.  R: Pt is compliant with medications, denies adverse drug reactions. Off unit for meals and activities; return to unit without issues. Tolerates all PO intake well. POC remains effective for mood stability and safety.

## 2017-06-21 NOTE — Plan of Care (Signed)
  Problem: Nutritional: Goal: Ability to achieve adequate nutritional intake will improve Outcome: Progressing Note:  Pt reports he has been eating well today and he ate several snacks tonight.

## 2017-06-21 NOTE — Plan of Care (Signed)
  Problem: Health Behavior/Discharge Planning: Goal: Compliance with prescribed medication regimen will improve Outcome: Progressing Note:  Pt was compliant with medications tonight.

## 2017-06-21 NOTE — [facility] Group Notes (Signed)
[facility] LCSW Group Therapy 06/21/2017 1:15pm  Type of Therapy: Group Therapy- Feelings Around Discharge & Establishing a Supportive Framework  Participation Level:  Minimal  Description of Group:   What is a supportive framework? What does it look like feel like and how do I discern it from and unhealthy non-supportive network? Learn how to cope when supports are not helpful and don't support you. Discuss what to do when your family/friends are not supportive.  Summary of Patient Progress  Garrett [name] came to group but slept in his chair for the first half of the group.  The doctor woke him up and called him out of the room.  When he returned to group he sat in the back of the room and listened quietly.  When asked to share about his recovery story he chose not to share.   Therapeutic Modalities:   Cognitive Behavioral Therapy Person-Centered Therapy Motivational Interviewing   Aram Beechamngel M Jacqulene Huntley, Student-Social Work 06/21/2017 2:01 PM

## 2017-06-21 NOTE — Progress Notes (Signed)
NUTRITION ASSESSMENT  Pt identified as at risk on the Malnutrition Screen Tool  INTERVENTION: 1. Educated patient on the importance of nutrition and encouraged intake of food and beverages. 2. Discussed weight goals. 3. Supplements:  - will order Ensure Enlive BID, each supplement provides 350 kcal and 20 grams of protein. - will order daily multivitamin with minerals.   NUTRITION DIAGNOSIS: Unintentional weight loss related to sub-optimal intake as evidenced by pt report.   Goal: Pt to meet >/= 90% of their estimated nutrition needs.  Monitor:  PO intake  Assessment:  Pt admitted for recurrent, severe MDD. He has been depressed and experiencing hallucinations. Notes indicate pt recently lost his jobs and has not been bathing or eating much and has been sleeping more.   No PTA weight hx available. Will order items as outlined above. Continue to encourage PO intakes of meals, supplements, and snacks.     32 y.o. male  Height: Ht Readings from Last 1 Encounters:  06/20/17 5\' 2"  (1.575 m)    Weight: Wt Readings from Last 1 Encounters:  06/20/17 117 lb (53.1 kg)    Weight Hx: Wt Readings from Last 10 Encounters:  06/20/17 117 lb (53.1 kg)  06/18/17 130 lb (59 kg)    BMI:  Body mass index is 21.4 kg/m. Pt meets criteria for normal weight based on current BMI.  Estimated Nutritional Needs: Kcal: 25-30 kcal/kg Protein: > 1 gram protein/kg Fluid: 1 ml/kcal  Diet Order: No Roommate Pt is also offered choice of unit snacks mid-morning and mid-afternoon.  Pt is eating as desired.   Lab results and medications reviewed.      Trenton GammonJessica Jazir Newey, MS, RD, LDN, [facility] Inpatient Clinical Dietitian Pager # [phone] After hours/weekend pager # [phone]

## 2017-06-21 NOTE — Progress Notes (Addendum)
D: Pt was in bed in his room upon initial approach.  Pt presents with preoccupied, anxious affect and mood.  He is electively mute and shakes or nods his head or provides one word responses to writer's questions during assessment.  Pt denies SI/HI, denies hallucinations, denies pain.  Pt has been isolative to his room for the majority of the night.  He did not attend evening group.  A: Introduced self to pt.  Support and encouragement offered. Medications administered per order.  On-site provider notified of pt's behavior and Ativan 1 mg POx1 was ordered and administered.  Q15 minute safety checks maintained.  R: Pt is safe on the unit.  Pt is compliant with medications.  Pt nods his head when asked if he will notify staff of any thoughts of SI or HI.  Will continue to monitor and assess.

## 2017-06-21 NOTE — Progress Notes (Signed)
D: Pt was in bed in his room upon initial approach.  Pt presents with depressed affect and mood.  He states his day was "good."  When asked what his goal was today, he nodded and then looked around the room.  Communicated with Clinical research associatewriter using interpreter and pt was more verbal.  He denies SI/HI, hallucinations, and pain.  He reports he has been eating well today and he has had several snacks tonight.  Pt has been visible in milieu at times with no peer interaction.  He did not attend evening group.  A: Introduced self to pt.  Educated pt related to his HS medication via interpreter. Medication administered per order.  Q15 minute safety checks maintained.  R: Pt is safe on the unit.  Pt is compliant with medication.  Pt verbally contracts for safety.  Will continue to monitor and assess.

## 2017-06-21 NOTE — Progress Notes (Signed)
Recreation Therapy Notes   Date: 3.21.19 Time: 10:00 am  Location: [address] Dayroom   Group Topic: Decision Making   Goal Area(s) Addresses:  Goal 1.1 To improve decision making  - Group will identify the importance of decision making  - Group will identify how choices influence decision making - Group will understand the importance of communication   Behavioral Response: Appropriate   Intervention: Game  Activity: Patients were given a scenario where they had to decide who will receive a new heart from a transplant list. Patients had to individually decided their rankings as well as make a decision as a group   Education: Universal HealthDecison Making, Communication, Critical Thinking   Education Outcome: Acknowledges Education  Clinical Observations/Feedback: Patient arrived towards the end of Recreation Therapy group tx. Upon arrival, patient actively listened to group discussion with the help of interpreter    Sheryle HailDarian Maddalyn Lutze, Recreation Therapy Intern  Sheryle HailDarian Rush Salce 06/21/2017 9:09 AM

## 2017-06-21 NOTE — Progress Notes (Signed)
Adult Psychoeducational Group Note  Date:  06/21/2017 Time:  8:21 PM  Group Topic/Focus:  Wrap-Up Group:   The focus of this group is to help patients review their daily goal of treatment and discuss progress on daily workbooks.  Participation Level:  Did Not Attend  Participation Quality:  Did not attend  Affect:  Did not attend  Cognitive:  Did not attend  Insight: None  Engagement in Group:  Did not attend  Modes of Intervention:  Did not attend  Additional Comments:  Patient did not attend wrap up group tonight.  Machelle Raybon L Julyan Gales 06/21/2017, 8:21 PM

## 2017-06-21 NOTE — H&P (Signed)
Psychiatric Admission Assessment Adult  Patient Identification: Garrett HarrisHtee [name] MRN:  [id number] Date of Evaluation:  06/21/2017 Chief Complaint:  MDD, SEVERE WITH PSYCHOSIS Principal Diagnosis: MDD (major depressive disorder), recurrent, severe, with psychosis (HCC) Diagnosis:   Patient Active Problem List   Diagnosis Date Noted  . Major depressive disorder, recurrent severe without psychotic features (HCC) [F33.2] 06/20/2017  . MDD (major depressive disorder), recurrent, severe, with psychosis (HCC) [F33.3] 06/19/2017   History of Present Illness:   Garrett [name] (Prounounced "Garrett [name]") is a 32 y/o M originally from [place] (primary language is Garrett [name]) with previous psychiatric history of MDD with psychotic features who presented as a voluntary walk in to [facility] accompanied by his pastor with worsening symptoms of psychosis and disorganization. Pt was responding to internal stimuli and he reported increased depression and anxiety in context of losing his job on 05/24/17. In ED pt was guarded with thought blocking, but he was agreeable to inpatient hospitalization. He was started on low dose of risperdal and [facility]  Upon initial presentation, pt was interviewed with assistance of Zada FindersKaren Interpreter [facility](Garrett [name]). Pt provides vague responses and is guarded with his history despite multiple prompts. When asked why he came to the hospital, pt shares, "I'm sick." Pt was asked to provide details of what was bothering him and he replied, "I have head aches and I'm dizzy." Pt responds "I don't know" to majority of questions. He is able to note that he went to his pastor to ask for help, but he does not know what was bothering him at the time he went to his pastor. He denies SI/HI/AH/VH. He denies depression and anxiety. He was asked directly about his job, and pt does not indicate that he lost his job, but rather suggests that he is still employed working as a Garment/textile technologistchicken processor. He reports that he has been eating  well and his sleep is normal. He denies symptoms of depression, mania, OCD, and PTSD. He endorses use of alcohol but he cannot quantify how much, and he denies all other illicit substance use.  Discussed with patient about treatment options. He indicates he has not been taking any medications at home recently. [facility] with [facility] presentation at which time he was stabilized on haldol and celexa after a 20-day admission. In ED pt was started on low dose of risperdal and lexapro, and he is in agreement to continue those medications with increase in dose of risperdal. 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 He had no further questions, comments, or concerns.   Associated Signs/Symptoms: Depression Symptoms:  depressed mood, anxiety, (Hypo) Manic Symptoms:  NA Anxiety Symptoms:  Excessive Worry, Psychotic Symptoms:  responding to internal stimuli, catatonic features PTSD Symptoms: NA Total Time spent with patient: 1 hour  Past Psychiatric History:  - Previous dx of MDD with psychotic features vs psychotic disorder unspecified - Pt is unaware of previous inpatient stays, but as per chart review [facility] - He has no current outpatient provider - Denies history of suicide attempt  Is the patient at risk to self? Yes.    Has the patient been a risk to self in the past 6 months? Yes.    Has the patient been a risk  to self within the distant past? Yes.    Is the patient a risk to others? No.  Has the patient been a risk to others in the past 6 months? No.  Has the patient been a risk to others within the distant past? No.   Prior Inpatient Therapy:   Prior Outpatient Therapy:    Alcohol Screening: Patient refused  Alcohol Screening Tool: Yes 1. How often do you have a drink containing alcohol?: Never 2. How many drinks containing alcohol do you have on a typical day when you are drinking?: 1 or 2 3. How often do you have six or more drinks on one occasion?: Never AUDIT-C Score: 0 4. How often during the last year have you found that you were not able to stop drinking once you had started?: Never 5. How often during the last year have you failed to do what was normally expected from you becasue of drinking?: Never 6. How often during the last year have you needed a first drink in the morning to get yourself going after a heavy drinking session?: Never 7. How often during the last year have you had a feeling of guilt of remorse after drinking?: Never 8. How often during the last year have you been unable to remember what happened the night before because you had been drinking?: Never 9. Have you or someone else been injured as a result of your drinking?: No 10. Has a relative or friend or a doctor or another health worker been concerned about your drinking or suggested you cut down?: No Alcohol Use Disorder Identification Test Final Score (AUDIT): 0 Intervention/Follow-up: AUDIT Score <7 follow-up not indicated Substance Abuse History in the last 12 months:  Yes.   Consequences of Substance Abuse: NA Previous Psychotropic Medications: Yes  Psychological Evaluations: Yes  Past Medical History:  Past Medical History:  Diagnosis Date  . Depression    History reviewed. No pertinent surgical history. Family History: History reviewed. No pertinent family history. Family Psychiatric  History: pt denies family psychiatric history Tobacco Screening: Have you used any form of tobacco in the last 30 days? (Cigarettes, Smokeless Tobacco, Cigars, and/or Pipes): No Social History: Pt was born in [place] and he has been living in [place] since 2010. He lives with a few roommates. He is not married. He has no  children. 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 He denies legal and trauma history. Social History   Substance and Sexual Activity  Alcohol Use Not on file     Social History   Substance and Sexual Activity  Drug Use Not on file    Additional Social History: Does patient have children?: No                         Allergies:  No Known Allergies Lab Results: No results found for this or any previous visit (from the past 48 hour(s)).  Blood Alcohol level:  Lab Results  Component Value Date   ETH <10 06/18/2017   [facility]  12/11/2008    <5        LOWEST DETECTABLE LIMIT FOR SERUM ALCOHOL IS 5 mg/dL FOR MEDICAL PURPOSES ONLY    Metabolic Disorder Labs:  No results found for: HGBA1C, MPG No results found for: PROLACTIN No results found for: CHOL, TRIG, HDL, CHOLHDL, VLDL, LDLCALC  Current Medications: Current Facility-Administered Medications  Medication Dose Route Frequency Provider Last Rate Last Dose  . acetaminophen (TYLENOL) tablet  650 mg  650 mg Oral Q6H PRN Charm Rings, NP      . alum & mag hydroxide-simeth (MAALOX/MYLANTA) 200-200-20 MG/5ML suspension 30 mL  30 mL Oral Q4H PRN Charm Rings, NP      . escitalopram (LEXAPRO) tablet 5 mg  5 mg Oral Daily Charm Rings, NP   5 mg at 06/21/17 1610  . feeding supplement (ENSURE ENLIVE) (ENSURE ENLIVE) liquid 237 mL  237 mL Oral BID BM Micheal Likens, MD   237 mL at 06/21/17 1016  . LORazepam (ATIVAN) tablet 1 mg  1 mg Oral TID Micheal Likens, MD      . magnesium hydroxide (MILK OF MAGNESIA) suspension 30 mL  30 mL Oral Daily PRN Charm Rings, NP      . multivitamin with minerals tablet 1 tablet  1 tablet Oral Daily Micheal Likens, MD   1 tablet at 06/21/17 1017  . risperiDONE (RISPERDAL) tablet 1 mg  1 mg Oral QHS Micheal Likens, MD       PTA Medications: No medications prior to admission.    Musculoskeletal: Strength & Muscle Tone: within  normal limits Gait & Station: normal, slowed Patient leans: N/A  Psychiatric Specialty Exam: Physical Exam  Nursing note and vitals reviewed.   Review of Systems  Constitutional: Negative for chills and fever.  Respiratory: Negative for cough and shortness of breath.   Cardiovascular: Negative for chest pain and palpitations.  Gastrointestinal: Negative for heartburn and nausea.  Neurological: Positive for headaches.  Psychiatric/Behavioral: Negative for depression, hallucinations and suicidal ideas. The patient is nervous/anxious. The patient does not have insomnia.     Blood pressure 108/61, pulse 91, temperature 98.2 F (36.8 C), temperature source Oral, resp. rate 16, height 5\' 2"  (1.575 m), weight 53.1 kg (117 lb), SpO2 98 %.Body mass index is 21.4 kg/m.  General Appearance: Casual and Fairly Groomed  Eye Contact:  Fair  Speech:  Blocked and Clear and Coherent  Volume:  Normal  Mood:  Anxious and Depressed  Affect:  Flat  Thought Process:  Goal Directed and Descriptions of Associations: Tangential  Orientation:  Full (Time, Place, and Person)  Thought Content:  Hallucinations: responding to internal stimuli  Suicidal Thoughts:  No  Homicidal Thoughts:  No  Memory:  Immediate;   Fair Recent;   Fair Remote;   Fair  Judgement:  Fair  Insight:  Lacking  Psychomotor Activity:  Psychomotor Retardation  Concentration:  Concentration: Fair  Recall:  Poor  Fund of Knowledge:  Fair  Language:  Fair  Akathisia:  No  Handed:    AIMS (if indicated):     Assets:  Desire for Improvement Resilience  ADL's:  Intact  Cognition:  WNL  Sleep:  Number of Hours: 6.25    Treatment Plan Summary: Daily contact with patient to assess and evaluate symptoms and progress in treatment and Medication management  Observation Level/Precautions:  15 minute checks  Laboratory:  CBC Chemistry Profile HbAIC UDS UA  Psychotherapy:  Encourage participation in groups and therapeutic milieu    Medications:  Change risperdal 0.25 bid to risperdal 1mg  qhs, continue lexapro 5mg  po qday, start ativan 1mg  po TID  Consultations:  none  Discharge Concerns:    Estimated LOS: 5-7 days  Other:     Physician Treatment Plan for Primary Diagnosis: MDD (major depressive disorder), recurrent, severe, with psychosis (HCC) Long Term Goal(s): Improvement in symptoms so as ready for discharge  Short Term Goals: Ability  to identify changes in lifestyle to reduce recurrence of condition will improve  Physician Treatment Plan for Secondary Diagnosis: Principal Problem:   MDD (major depressive disorder), recurrent, severe, with psychosis (HCC)  Long Term Goal(s): Improvement in symptoms so as ready for discharge  Short Term Goals: Ability to identify and develop effective coping behaviors will improve  I certify that inpatient services furnished can reasonably be expected to improve the patient's condition.    Micheal Likens, MD 3/21/201912:40 PM

## 2017-06-22 DIAGNOSIS — G4700 Insomnia, unspecified: Secondary | ICD-10-CM

## 2017-06-22 DIAGNOSIS — M549 Dorsalgia, unspecified: Secondary | ICD-10-CM

## 2017-06-22 DIAGNOSIS — R51 Headache: Secondary | ICD-10-CM

## 2017-06-22 DIAGNOSIS — F333 Major depressive disorder, recurrent, severe with psychotic symptoms: Principal | ICD-10-CM

## 2017-06-22 DIAGNOSIS — F061 Catatonic disorder due to known physiological condition: Secondary | ICD-10-CM

## 2017-06-22 LAB — LIPID PANEL
CHOL/HDL RATIO: 4.6 ratio
Cholesterol: 199 mg/dL (ref 0–200)
HDL: 43 mg/dL (ref >40)
LDL CALC: 135 mg/dL — AB (ref 0–99)
Triglycerides: 107 mg/dL (ref <150)
VLDL: 21 mg/dL (ref 0–40)

## 2017-06-22 LAB — HEMOGLOBIN A1C
HEMOGLOBIN A1C: 5 % (ref 4.8–5.6)
Mean Plasma Glucose: 96.8 mg/dL

## 2017-06-22 LAB — TSH: TSH: 3.252 u[IU]/mL (ref 0.350–4.500)

## 2017-06-22 MED ORDER — TRAZODONE HCL 100 MG PO TABS
100.0000 mg | ORAL_TABLET | Freq: Every evening | ORAL | Status: DC | PRN
  Administered 2017-06-22 – 2017-07-04 (×12): 100 mg via ORAL
  Filled 2017-06-22 (×14): qty 1

## 2017-06-22 MED ORDER — HYDROXYZINE HCL 50 MG PO TABS
50.0000 mg | ORAL_TABLET | Freq: Once | ORAL | Status: AC
  Administered 2017-06-22: 50 mg via ORAL
  Filled 2017-06-22 (×2): qty 1

## 2017-06-22 MED ORDER — TRAZODONE HCL 50 MG PO TABS
50.0000 mg | ORAL_TABLET | Freq: Every evening | ORAL | Status: DC | PRN
  Filled 2017-06-22: qty 1

## 2017-06-22 NOTE — [facility] Suicide Risk Assessment (Signed)
[facility] INPATIENT:  Family/Significant Other Suicide Prevention Education  Suicide Prevention Education:  Education Completed; No one has been identified by the patient as the family member/significant other with whom the patient will be residing, and identified as the person(s) who will aid the patient in the event of a mental health crisis (suicidal ideations/suicide attempt).  With written consent from the patient, the family member/significant other has been provided the following suicide prevention education, prior to the and/or following the discharge of the patient.  The suicide prevention education provided includes the following:  Suicide risk factors  Suicide prevention and interventions  National Suicide Hotline telephone number  [facility] assessment telephone number  [facility] Emergency Assistance 911  [facility] and/or [facility]idential Mobile Crisis [facility] telephone number  Request made of family/significant other to:  Remove weapons (e.g., guns, rifles, knives), all items previously/currently identified as safety concern.    Remove drugs/medications (over-the-counter, prescriptions, illicit drugs), all items previously/currently identified as a safety concern.  The family member/significant other verbalizes understanding of the suicide prevention education information provided.  The family member/significant other agrees to remove the items of safety concern listed above. The patient did not endorse SI at the time of admission, nor did the patient c/o SI during the stay here.  SPE not required.   Garrett [name] 06/22/2017, 3:25 PM

## 2017-06-22 NOTE — Progress Notes (Signed)
Pt information per interpretor D: Pt denies SI/HI/VH. +ve AH- better Pt is pleasant and cooperative. Pt stated he felt alone on the unit. Pt does not feel like he has anything to do, this has to do with language/ cultural barriers. Pt stated he wanted to know if there was a way to change his brain, pt was informed the medications should help pt to start feeling better.   A: Pt was offered support and encouragement. Pt was given scheduled medications. Pt was encourage to attend groups. Q 15 minute checks were done for safety.   R: Pt is taking medication. Pt has no complaints.Pt receptive to treatment and safety maintained on unit.

## 2017-06-22 NOTE — [facility] Group Notes (Signed)
[facility] LCSW Group Therapy  06/22/2017  1:05 PM  Type of Therapy:  Group therapy  Participation Level:  Active  Participation Quality:  Attentive  Affect:  Flat  Cognitive:  Oriented  Insight:  Limited  Engagement in Therapy:  Limited  Modes of Intervention:  Discussion, Socialization  Summary of Progress/Problems:  Chaplain was here to lead a group on themes of hope and courage. Declined to share when invited.  Later,  Selected a nature scene for his picture and stated it gave him hope because it reminded him of home in [place]. Garrett [name], Garrett [name] 06/22/2017 10:59 AM

## 2017-06-22 NOTE — Plan of Care (Signed)
  Problem: Nutritional: Goal: Ability to achieve adequate nutritional intake will improve Outcome: Progressing Note:  Pt reports he has been eating well today and he ate several snacks tonight.    

## 2017-06-22 NOTE — Progress Notes (Signed)
Adult Psychoeducational Group Note  Date:  06/22/2017 Time:  8:21 PM  Group Topic/Focus:  Wrap-Up Group:   The focus of this group is to help patients review their daily goal of treatment and discuss progress on daily workbooks.  Participation Level:  Active  Participation Quality:  Appropriate  Affect:  Appropriate  Cognitive:  Appropriate  Insight: Appropriate  Engagement in Group:  Engaged  Modes of Intervention:  Discussion  Additional Comments: The patient expressed that he rates today a 9.The patient also said that he attended group.  [name], Keilen Kahl Lee 06/22/2017, 8:21 PM

## 2017-06-22 NOTE — Progress Notes (Signed)
[facility] MD Progress Note  06/22/2017 1:39 PM Garrett [name]  MRN:  [id number] Subjective:    Garrett [name] (Prounounced "Tee See") is a 32 y/o M originally from [place] (primary language is Garrett [name]) with previous psychiatric history of MDD with psychotic features who presented as a voluntary walk in to [facility] accompanied by his pastor with worsening symptoms of psychosis and disorganization. Pt was responding to internal stimuli and he reported increased depression and anxiety in context of losing his job on 05/24/17. In ED pt was guarded with thought blocking, but he was agreeable to inpatient hospitalization. He was started on low dose of risperdal and transferred to [facility]. Pt was started on scheduled ativan due to symptoms of catatonia and poor intake, and he had some improvement of his presenting symptoms.   Today upon interview, pt was interviewed with assistance of Garrett [name] Interpreter (Garrett [name]). When asked if he has any concerns today, pt replies, "Headache and back pain." Pt was reminded that he has PRN of acetaminophen available which he had not utilized at time of interview. He reports that he slept well, but RN staff only recorded 1.5 hours of sleep. He endorses AH of "hearing somebody arguing" and he denies SI/HI/VH. He is tolerating his medications well, and he thinks they have been helpful. He is vague in reporting what has improved referring to feeling that his "health" is better. Pt asks if he will be able to return to work after his discharge, and discussed with patient that he should be able to work once stabilized in the hospital. Pt will be started on PRN trazodone to help with insomnia. He was in agreement with the above plan, and he had no further questions, comments, or concerns.  Principal Problem: MDD (major depressive disorder), recurrent, severe, with psychosis (HCC) Diagnosis:   Patient Active Problem List   Diagnosis Date Noted  . Major depressive disorder, recurrent severe without psychotic  features (HCC) [F33.2] 06/20/2017  . MDD (major depressive disorder), recurrent, severe, with psychosis (HCC) [F33.3] 06/19/2017   Total Time spent with patient: 30 minutes  Past Psychiatric History: see H&P  Past Medical History:  Past Medical History:  Diagnosis Date  . Depression    History reviewed. No pertinent surgical history. Family History: History reviewed. No pertinent family history. Family Psychiatric  History: see H&P Social History:  Social History   Substance and Sexual Activity  Alcohol Use Not on file     Social History   Substance and Sexual Activity  Drug Use Not on file    Social History   Socioeconomic History  . Marital status: Single    Spouse name: Not on file  . Number of children: Not on file  . Years of education: Not on file  . Highest education level: Not on file  Occupational History  . Not on file  Social Needs  . Financial resource strain: Not on file  . Food insecurity:    Worry: Not on file    Inability: Not on file  . Transportation needs:    Medical: Not on file    Non-medical: Not on file  Tobacco Use  . Smoking status: Never Smoker  . Smokeless tobacco: Never Used  Substance and Sexual Activity  . Alcohol use: Not on file  . Drug use: Not on file  . Sexual activity: Not on file  Lifestyle  . Physical activity:    Days per week: Not on file    Minutes per session: Not on  file  . Stress: Not on file  Relationships  . Social connections:    Talks on phone: Not on file    Gets together: Not on file    Attends religious service: Not on file    Active member of club or organization: Not on file    Attends meetings of clubs or organizations: Not on file    Relationship status: Not on file  Other Topics Concern  . Not on file  Social History Narrative  . Not on file   Additional Social History:                         Sleep: Poor  Appetite:  Good  Current Medications: Current Facility-Administered  Medications  Medication Dose Route Frequency Provider Last Rate Last Dose  . acetaminophen (TYLENOL) tablet 650 mg  650 mg Oral Q6H PRN Charm RingsLord, Jamison Y, NP      . alum & mag hydroxide-simeth (MAALOX/MYLANTA) 200-200-20 MG/5ML suspension 30 mL  30 mL Oral Q4H PRN Charm RingsLord, Jamison Y, NP      . escitalopram (LEXAPRO) tablet 5 mg  5 mg Oral Daily Charm RingsLord, Jamison Y, NP   5 mg at 06/22/17 [id number]  . feeding supplement (ENSURE ENLIVE) (ENSURE ENLIVE) liquid 237 mL  237 mL Oral BID BM Micheal Likensainville, Jhalil Silvera T, MD   237 mL at 06/22/17 1316  . LORazepam (ATIVAN) tablet 1 mg  1 mg Oral TID Micheal Likens[place], Anavi Branscum T, MD   1 mg at 06/22/17 1317  . magnesium hydroxide (MILK OF MAGNESIA) suspension 30 mL  30 mL Oral Daily PRN Charm RingsLord, Jamison Y, NP      . multivitamin with minerals tablet 1 tablet  1 tablet Oral Daily Micheal Likensainville, Atley Scarboro T, MD   1 tablet at 06/22/17 [phone]  . risperiDONE (RISPERDAL) tablet 1 mg  1 mg Oral QHS Micheal Likensainville, Natasia Sanko T, MD   1 mg at 06/21/17 2103    Lab Results:  Results for orders placed or performed during the hospital encounter of 06/20/17 (from the past 48 hour(s))  TSH     Status: None   Collection Time: 06/22/17  6:41 AM  Result Value Ref Range   TSH 3.252 0.350 - 4.500 uIU/mL    Comment: Performed by a 3rd Generation assay with a functional sensitivity of <=0.01 uIU/mL. Performed at [facility], [address] [place], [place] [address]   Lipid panel     Status: Abnormal   Collection Time: 06/22/17  6:41 AM  Result Value Ref Range   Cholesterol 199 0 - 200 mg/dL   Triglycerides 119107 <147<150 mg/dL   HDL 43 >82>40 mg/dL   Total CHOL/HDL Ratio 4.6 RATIO   VLDL 21 0 - 40 mg/dL   LDL Cholesterol 956135 (H) 0 - 99 mg/dL    Comment:        Total Cholesterol/HDL:CHD Risk Coronary Heart Disease Risk Table                     Men   Women  1/2 Average Risk   3.4   3.3  Average Risk       5.0   4.4  2 X Average Risk   9.6   7.1  3 X Average Risk  23.4   11.0         Use the calculated Patient Ratio above and the CHD Risk Table to determine the patient's CHD Risk.  ATP III CLASSIFICATION (LDL):  <100     mg/dL   Optimal  409-811  mg/dL   Near or Above                    Optimal  130-159  mg/dL   Borderline  914-782  mg/dL   High  >956     mg/dL   Very High Performed at [facility], [address] [place], [place] [address]   Hemoglobin A1c     Status: None   Collection Time: 06/22/17  6:41 AM  Result Value Ref Range   Hgb A1c MFr Bld 5.0 4.8 - 5.6 %    Comment: (NOTE) Pre diabetes:          5.7%-6.4% Diabetes:              >6.4% Glycemic control for   <7.0% adults with diabetes    Mean Plasma Glucose 96.8 mg/dL    Comment: Performed at [facility], [address] [place], [place] [address]    Blood Alcohol level:  Lab Results  Component Value Date   ETH <10 06/18/2017   ETH  12/11/2008    <5        LOWEST DETECTABLE LIMIT FOR SERUM ALCOHOL IS 5 mg/dL FOR MEDICAL PURPOSES ONLY    Metabolic Disorder Labs: Lab Results  Component Value Date   HGBA1C 5.0 06/22/2017   MPG 96.8 06/22/2017   No results found for: PROLACTIN Lab Results  Component Value Date   CHOL 199 06/22/2017   TRIG 107 06/22/2017   HDL 43 06/22/2017   CHOLHDL 4.6 06/22/2017   VLDL 21 06/22/2017   LDLCALC 135 (H) 06/22/2017    Physical Findings: AIMS: Facial and Oral Movements Muscles of Facial Expression: None, normal Lips and Perioral Area: None, normal Jaw: None, normal Tongue: None, normal,Extremity Movements Upper (arms, wrists, hands, fingers): None, normal Lower (legs, knees, ankles, toes): None, normal, Trunk Movements Neck, shoulders, hips: None, normal, Overall Severity Severity of abnormal movements (highest score from questions above): None, normal Incapacitation due to abnormal movements: None, normal Patient's awareness of abnormal movements (rate only patient's report): No Awareness, Dental  Status Current problems with teeth and/or dentures?: No Does patient usually wear dentures?: No  CIWA:    COWS:     Musculoskeletal: Strength & Muscle Tone: within normal limits Gait & Station: normal Patient leans: N/A  Psychiatric Specialty Exam: Physical Exam  Nursing note and vitals reviewed.   Review of Systems  Constitutional: Negative for chills and fever.  Respiratory: Negative for cough and shortness of breath.   Cardiovascular: Negative for chest pain.  Gastrointestinal: Negative for abdominal pain, heartburn, nausea and vomiting.  Musculoskeletal: Positive for back pain.  Neurological: Positive for headaches.  Psychiatric/Behavioral: Positive for hallucinations. Negative for depression and suicidal ideas. The patient has insomnia. The patient is not nervous/anxious.     Blood pressure 113/74, pulse 90, temperature 97.9 F (36.6 C), temperature source Oral, resp. rate 16, height 5\' 2"  (1.575 m), weight 53.1 kg (117 lb), SpO2 98 %.Body mass index is 21.4 kg/m.  General Appearance: Casual and Fairly Groomed  Eye Contact:  Good  Speech:  Clear and Coherent and Normal Rate (as per interpreter's perspective)  Volume:  Normal  Mood:  Euthymic  Affect:  Flat  Thought Process:  Coherent and Goal Directed  Orientation:  Full (Time, Place, and Person)  Thought Content:  Hallucinations: Auditory  Suicidal Thoughts:  No  Homicidal Thoughts:  No  Memory:  Immediate;   Fair Recent;   Fair Remote;   Fair  Judgement:  Fair  Insight:  Fair  Psychomotor Activity:  Normal  Concentration:  Concentration: Fair  Recall:  [employer] of Knowledge:  Fair  Language:  Fair  Akathisia:  No  Handed:    AIMS (if indicated):     Assets:  Resilience Social Support  ADL's:  Intact  Cognition:  WNL  Sleep:  Number of Hours: 1.5   Treatment Plan Summary: Daily contact with patient to assess and evaluate symptoms and progress in treatment and Medication management   -Continue  inpatient hospitalization  - MDD with psychotic features   - Continue lexapro 5mg  po qDay   - Continue risperdal 1mg  po qhs  - Catatonic features   - Continue ativan 1mg  po TID  - Poor nutritional intake   - Continue Ensure BID between meals  - Insomnia   - Start trazodone 50mg  po qhs prn insomnia (may repeat x1)  -Encourage participation in groups and therapeutic milieu  -Disposition planning will be ongoing  Micheal Likens, MD 06/22/2017, 1:39 PM

## 2017-06-22 NOTE — Plan of Care (Signed)
  Problem: Safety: Goal: Ability to disclose and discuss suicidal ideas will improve Outcome: Progressing Note:  Pt safe on the unit at this time

## 2017-06-22 NOTE — Plan of Care (Signed)
  Problem: Activity: Goal: Sleeping patterns will improve Outcome: Not Progressing Note:  Pt slept 1.5 hours last night.

## 2017-06-22 NOTE — Progress Notes (Signed)
Pt has been awake for the majority of the night.  He has asked multiple staff members for coffee multiple times and has been told that coffee will be available at 0600 when the dayroom is open.

## 2017-06-22 NOTE — Tx Team (Signed)
Interdisciplinary Treatment and Diagnostic Plan Update  06/22/2017 Time of Session: 9:47 AM  Randolf Sansoucie MRN: [id number]  Principal Diagnosis: MDD (major depressive disorder), recurrent, severe, with psychosis ([place])  Secondary Diagnoses: Principal Problem:   MDD (major depressive disorder), recurrent, severe, with psychosis ([place])   Current Medications:  Current Facility-Administered Medications  Medication Dose Route Frequency Provider Last Rate Last Dose  . acetaminophen (TYLENOL) tablet 650 mg  650 mg Oral Q6H PRN Patrecia Pour, NP      . alum & mag hydroxide-simeth (MAALOX/MYLANTA) 200-200-20 MG/5ML suspension 30 mL  30 mL Oral Q4H PRN Patrecia Pour, NP      . escitalopram (LEXAPRO) tablet 5 mg  5 mg Oral Daily Patrecia Pour, NP   5 mg at 06/22/17 0086  . feeding supplement (ENSURE ENLIVE) (ENSURE ENLIVE) liquid 237 mL  237 mL Oral BID BM Pennelope Bracken, MD   237 mL at 06/21/17 1408  . LORazepam (ATIVAN) tablet 1 mg  1 mg Oral TID Pennelope Bracken, MD   1 mg at 06/22/17 7619  . magnesium hydroxide (MILK OF MAGNESIA) suspension 30 mL  30 mL Oral Daily PRN Patrecia Pour, NP      . multivitamin with minerals tablet 1 tablet  1 tablet Oral Daily Pennelope Bracken, MD   1 tablet at 06/22/17 [phone]  . risperiDONE (RISPERDAL) tablet 1 mg  1 mg Oral QHS Pennelope Bracken, MD   1 mg at 06/21/17 2103    PTA Medications: No medications prior to admission.    Patient Stressors: Occupational concerns  Patient Strengths: Capable of independent living Motivation for treatment/growth Supportive family/friends  Treatment Modalities: Medication Management, Group therapy, Case management,  1 to 1 session with clinician, Psychoeducation, Recreational therapy.   Physician Treatment Plan for Primary Diagnosis: MDD (major depressive disorder), recurrent, severe, with psychosis ([place]) Long Term Goal(s): Improvement in symptoms so as ready for discharge  Short  Term Goals: Ability to identify changes in lifestyle to reduce recurrence of condition will improve Ability to identify and develop effective coping behaviors will improve  Medication Management: Evaluate patient's response, side effects, and tolerance of medication regimen.  Therapeutic Interventions: 1 to 1 sessions, Unit Group sessions and Medication administration.  Evaluation of Outcomes: Progressing  Physician Treatment Plan for Secondary Diagnosis: Principal Problem:   MDD (major depressive disorder), recurrent, severe, with psychosis ([place])   Long Term Goal(s): Improvement in symptoms so as ready for discharge  Short Term Goals: Ability to identify changes in lifestyle to reduce recurrence of condition will improve Ability to identify and develop effective coping behaviors will improve  Medication Management: Evaluate patient's response, side effects, and tolerance of medication regimen.  Therapeutic Interventions: 1 to 1 sessions, Unit Group sessions and Medication administration.  Evaluation of Outcomes: Progressing   RN Treatment Plan for Primary Diagnosis: MDD (major depressive disorder), recurrent, severe, with psychosis ([place]) Long Term Goal(s): Knowledge of disease and therapeutic regimen to maintain health will improve  Short Term Goals: Ability to identify and develop effective coping behaviors will improve and Compliance with prescribed medications will improve  Medication Management: RN will administer medications as ordered by provider, will assess and evaluate patient's response and provide education to patient for prescribed medication. RN will report any adverse and/or side effects to prescribing provider.  Therapeutic Interventions: 1 on 1 counseling sessions, Psychoeducation, Medication administration, Evaluate responses to treatment, Monitor vital signs and CBGs as ordered, Perform/monitor CIWA, COWS, AIMS and Fall Risk screenings  as ordered, Perform wound care  treatments as ordered.  Evaluation of Outcomes: Progressing   LCSW Treatment Plan for Primary Diagnosis: MDD (major depressive disorder), recurrent, severe, with psychosis ([place]) Long Term Goal(s): Safe transition to appropriate next level of care at discharge, Engage patient in therapeutic group addressing interpersonal concerns.  Short Term Goals: Engage patient in aftercare planning with referrals and resources  Therapeutic Interventions: Assess for all discharge needs, 1 to 1 time with Social worker, Explore available resources and support systems, Assess for adequacy in community support network, Educate family and significant other(s) on suicide prevention, Complete Psychosocial Assessment, Interpersonal group therapy.  Evaluation of Outcomes: Met  Return home, follow up outp at [facility].   Progress in Treatment: Attending groups: Yes Participating in groups:Minimally Taking medication as prescribed: Yes Toleration medication: Yes, no side effects reported at this time Family/Significant other contact made: Yes-pastor Patient understands diagnosis: No  Limited insight Discussing patient identified problems/goals with staff: Yes Medical problems stabilized or resolved: Yes Denies suicidal/homicidal ideation: Yes Issues/concerns per patient self-inventory: None Other: N/A  New problem(s) identified: None identified at this time.   New Short Term/Long Term Goal(s): "I want to get better, and take care of my health."  Discharge Plan or Barriers:   Reason for Continuation of Hospitalization: Depression Psychosis Medication stabilization   Estimated Length of Stay: 3/25  Attendees: Patient: Garrett [name] 06/22/2017  9:47 AM  Physician: Maris Berger, MD 06/22/2017  9:47 AM  Nursing: Elesa Massed RN 06/22/2017  9:47 AM  RN Care Manager: Lars Pinks, RN 06/22/2017  9:47 AM  Social Worker: Ripley Fraise 06/22/2017  9:47 AM  Recreational Therapist: Winfield Cunas 06/22/2017   9:47 AM  Other: Norberto Sorenson 06/22/2017  9:47 AM  Other:  06/22/2017  9:47 AM    Scribe for Treatment Team:  Roque Lias LCSW 06/22/2017 9:47 AM

## 2017-06-22 NOTE — Progress Notes (Addendum)
Recreation Therapy Notes   Date: 3.22.19 Time: 10:00 a.m. Location: [address] Dayroom   Group Topic: Healthy Proofreaderupport Systems   Goal Area(s) Addresses:  Goal 1.1: To increase awareness of a healthy support system - Group will identify the importance of a healthy support system - Group will identify their own support system  - Group will identify ways on how to improve their support system Goal 2.1: To improve communication  - Group will participate in opening discussion  - Group will communicate with peers during team building activity  - Group will participate in final discussion   Behavioral Response: Appropriate, Engaged   Intervention: STEM  Activity: Straw Tower: Patients must construct a tower using twenty-five straws and masking tape. Recreation Therapy Intern tested the stability of their tower by placing a book on top of their tower. Afterwards, Recreation Therapy Intern begun processing with group about building a healthy support system, and how communication is needed throughout the process.   Education: PharmacologistHealthy Support Systems, Communication   Education Outcome: Acknowledges Education  Clinical Observations/Feedback: Patient attended and participated appropriately during Recreation Therapy group session. Patient actively listened during opening and closing discussion with the help of interpreter.   Sheryle Hailarian Eann Cleland, Recreation Therapy Intern   Sheryle HailDarian Ventura H[place]beck 06/22/2017 12:36 PM

## 2017-06-22 NOTE — Progress Notes (Signed)
Patient ID: Garrett HarrisHtee [name], male   DOB: 03-24-1986, 32 y.o.   MRN: [id number]  D: Patient is alert, primary speaks the Clydie Braun[place]aren" language.  Nursing assessments completed with the assistance of this language interpreter at pt's bedside. Patient reports +AH-states that he hears nonspecific voices which say random things, and denies SI/HI/VH.  Pt reports that his sleep quality last night was good, reports that his appetite is good, reports his energy level as good, reports his concentration level as good.  Pt appears guarded, seems preoccupied as if responding to some kind of internal stimuli, but is calm and cooperative with care.  A: All meds being given as ordered, pt maintained on Q15 minute staff monitoring for safety, and language line interpreter available on unit to assist with language translation.    R: Pt denies any current concerns, will continue to monitor for safety.

## 2017-06-22 NOTE — Progress Notes (Signed)
Recreation Therapy Notes  INPATIENT RECREATION THERAPY ASSESSMENT  Patient Details Name: Garrett HarrisHtee Bee MRN: [id number] DOB: 1985/12/19 Today's Date: 06/22/2017       Information Obtained From: Use of Interpreter  Able to Participate in Assessment/Interview: Yes  Patient Presentation: Responsive  Reason for Admission (Per Patient): Patient reports being admitted into the hospital because he was feeling unwell   Patient Stressors: Friends  Coping Skills:   TV, Music, Substance Abuse, Read, Dance  Leisure Interests (2+):  Playing the guitar, Resting   Frequency of Recreation/Participation: Weekly  Awareness of Community Resources:  Yes  Community Resources:  [employer], Research scientist (physical sciences)Movie Theaters  Current Use: No  If no, Barriers?: Transportation  Expressed Interest in [employer] CorporationCommunity Resource Information: No  [place] of Residence[place]  [place]   Patient Main Form of Transportation: Car-Friends   Patient Strengths:  Patient was not able to identify any strengths   Patient Identified Areas of Improvement:  Patient was not able to identify any areas of improvements   Patient Goal for Hospitalization:  Patient was not able to identify a goal  Current SI (including self-harm):  No  Current HI:  No  Current AVH: No  Staff Intervention Plan: Group Attendance, Collaborate with Interdisciplinary Treatment Team  Consent to Intern Participation: Yes  Sheryle Hailarian Jadd Gasior, Recreation Therapy Intern   Sheryle HailDarian Savhanna Sliva 06/22/2017, 1:01 PM

## 2017-06-23 DIAGNOSIS — F419 Anxiety disorder, unspecified: Secondary | ICD-10-CM

## 2017-06-23 DIAGNOSIS — R450 Nervousness: Secondary | ICD-10-CM

## 2017-06-23 LAB — PROLACTIN: PROLACTIN: 32.9 ng/mL — AB (ref 4.0–15.2)

## 2017-06-23 MED ORDER — ESCITALOPRAM OXALATE 10 MG PO TABS
10.0000 mg | ORAL_TABLET | Freq: Every day | ORAL | Status: DC
  Administered 2017-06-24 – 2017-07-05 (×12): 10 mg via ORAL
  Filled 2017-06-23 (×14): qty 1

## 2017-06-23 MED ORDER — RISPERIDONE 2 MG PO TABS
2.0000 mg | ORAL_TABLET | Freq: Every day | ORAL | Status: DC
Start: 2017-06-23 — End: 2017-06-25
  Administered 2017-06-23 – 2017-06-24 (×2): 2 mg via ORAL
  Filled 2017-06-23 (×3): qty 1

## 2017-06-23 NOTE — Progress Notes (Addendum)
[facility]  06/23/2017 10:51 AM Shirl HarrisHtee Boyde  MRN:  [id number]   Subjective: Garrett [name] reports "I have a heaviness all over my body"   Objective: Garrett [name] 32 y.o male presents with a flat guarded affect.  Was seen with a vietnamese translator ( [employer]) for [employer] language.  Patient denies any suicidal or homicidal ideations.  Denies paranoia, visual or auditory hallucinations with short abrupt responses.  Reports he was transported to the hospital via his pastor, due to experiencing a "heaviness" Pointing to legs, chest and back. denies pain or pain radiation.  Patient appears to be thought blocking, fidget  and paranoid during this assessment.  Reports he is taking medications as prescribed and tolerating them well.  Denies medication side effects to include nausea, vomiting diarrhea. Discussed medication adjustment patient was agreeable (headnodding) Reports a good appetite and states he is sleeping well.  Support and encouragement reassurance was provided.   Per HPI: Garrett [name] (Prounounced "Tee See") is a 32 y/o M originally from [place] (primary language is [place]) with previous psychiatric history of MDD with psychotic features who presented as a voluntary walk in to [facility] accompanied by his pastor with worsening symptoms of psychosis and disorganization. Pt was responding to internal stimuli and he reported increased depression and anxiety in context of losing his job on 05/24/17. In ED pt was guarded with thought blocking, but he was agreeable to inpatient hospitalization. He was started on low dose of risperdal and [facility]   Principal Problem: MDD (major depressive disorder), recurrent, severe, with psychosis (HCC) Diagnosis:   Patient Active Problem List   Diagnosis Date Noted  . Major depressive disorder, recurrent severe without psychotic features (HCC) [F33.2] 06/20/2017  . MDD (major depressive disorder), recurrent, severe, with psychosis (HCC) [F33.3] 06/19/2017   Total Time  spent with patient: 30 minutes  Past Psychiatric History:   Past Medical History:  Past Medical History:  Diagnosis Date  . Depression    History reviewed. No pertinent surgical history. Family History: History reviewed. No pertinent family history. Family Psychiatric  History:  Social History:  Social History   Substance and Sexual Activity  Alcohol Use Not on file     Social History   Substance and Sexual Activity  Drug Use Not on file    Social History   Socioeconomic History  . Marital status: Single    Spouse name: Not on file  . Number of children: Not on file  . Years of education: Not on file  . Highest education level: Not on file  Occupational History  . Not on file  Social Needs  . Financial resource strain: Not on file  . Food insecurity:    Worry: Not on file    Inability: Not on file  . Transportation needs:    Medical: Not on file    Non-medical: Not on file  Tobacco Use  . Smoking status: Never Smoker  . Smokeless tobacco: Never Used  Substance and Sexual Activity  . Alcohol use: Not on file  . Drug use: Not on file  . Sexual activity: Not on file  Lifestyle  . Physical activity:    Days per week: Not on file    Minutes per session: Not on file  . Stress: Not on file  Relationships  . Social connections:    Talks on phone: Not on file    Gets together: Not on file    Attends religious service: Not on file  Active member of club or organization: Not on file    Attends meetings of clubs or organizations: Not on file    Relationship status: Not on file  Other Topics Concern  . Not on file  Social History Narrative  . Not on file   Additional Social History:                         Sleep: Fair  Appetite:  Fair  Current Medications: Current Facility-Administered Medications  Medication Dose Route Frequency Provider Last Rate Last Dose  . acetaminophen (TYLENOL) tablet 650 mg  650 mg Oral Q6H PRN Charm Rings, NP    650 mg at 06/22/17 1427  . alum & mag hydroxide-simeth (MAALOX/MYLANTA) 200-200-20 MG/5ML suspension 30 mL  30 mL Oral Q4H PRN Charm Rings, NP      . escitalopram (LEXAPRO) tablet 5 mg  5 mg Oral Daily Charm Rings, NP   5 mg at 06/23/17 0805  . feeding supplement (ENSURE ENLIVE) (ENSURE ENLIVE) liquid 237 mL  237 mL Oral BID BM Micheal Likens, MD   237 mL at 06/23/17 0858  . LORazepam (ATIVAN) tablet 1 mg  1 mg Oral TID Micheal Likens, MD   1 mg at 06/23/17 0805  . magnesium hydroxide (MILK OF MAGNESIA) suspension 30 mL  30 mL Oral Daily PRN Charm Rings, NP      . multivitamin with minerals tablet 1 tablet  1 tablet Oral Daily Micheal Likens, MD   1 tablet at 06/23/17 0805  . risperiDONE (RISPERDAL) tablet 1 mg  1 mg Oral QHS Micheal Likens, MD   1 mg at 06/21/17 2103  . traZODone (DESYREL) tablet 100 mg  100 mg Oral QHS PRN,MR X 1 Nira Conn A, NP   100 mg at 06/22/17 2059    Lab Results:  Results for orders placed or performed during the hospital encounter of 06/20/17 (from the past 48 hour(s))  TSH     Status: None   Collection Time: 06/22/17  6:41 AM  Result Value Ref Range   TSH 3.252 0.350 - 4.500 uIU/mL    Comment: Performed by a 3rd Generation assay with a functional sensitivity of <=0.01 uIU/mL. Performed at [facility], [address] [place], [place] [address]   Prolactin     Status: Abnormal   Collection Time: 06/22/17  6:41 AM  Result Value Ref Range   Prolactin 32.9 (H) 4.0 - 15.2 ng/mL    Comment: (NOTE) Performed At: [facility] [address] [place], [place] [id number] Jolene Schimke MD [id number] Performed at [facility], [address], [place], [place] [address]   Lipid panel     Status: Abnormal   Collection Time: 06/22/17  6:41 AM  Result Value Ref Range   Cholesterol 199 0 - 200 mg/dL   Triglycerides 259 <563 mg/dL   HDL 43 >87 mg/dL   Total CHOL/HDL  Ratio 4.6 RATIO   VLDL 21 0 - 40 mg/dL   LDL Cholesterol 564 (H) 0 - 99 mg/dL    Comment:        Total Cholesterol/HDL:CHD Risk Coronary Heart Disease Risk Table                     Men   Women  1/2 Average Risk   3.4   3.3  Average Risk       5.0  4.4  2 X Average Risk   9.6   7.1  3 X Average Risk  23.4   11.0        Use the calculated Patient Ratio above and the CHD Risk Table to determine the patient's CHD Risk.        ATP III CLASSIFICATION (LDL):  <100     mg/dL   Optimal  956-213  mg/dL   Near or Above                    Optimal  130-159  mg/dL   Borderline  086-578  mg/dL   High  >469     mg/dL   Very High Performed at [facility], [address], [place], [place] [address]   Hemoglobin A1c     Status: None   Collection Time: 06/22/17  6:41 AM  Result Value Ref Range   Hgb A1c MFr Bld 5.0 4.8 - 5.6 %    Comment: (NOTE) Pre diabetes:          5.7%-6.4% Diabetes:              >6.4% Glycemic control for   <7.0% adults with diabetes    Mean Plasma Glucose 96.8 mg/dL    Comment: Performed at [facility], [address] [place], [place] [address]    Blood Alcohol level:  Lab Results  Component Value Date   ETH <10 06/18/2017   ETH  12/11/2008    <5        LOWEST DETECTABLE LIMIT FOR SERUM ALCOHOL IS 5 mg/dL FOR MEDICAL PURPOSES ONLY    Metabolic Disorder Labs: Lab Results  Component Value Date   HGBA1C 5.0 06/22/2017   MPG 96.8 06/22/2017   Lab Results  Component Value Date   PROLACTIN 32.9 (H) 06/22/2017   Lab Results  Component Value Date   CHOL 199 06/22/2017   TRIG 107 06/22/2017   HDL 43 06/22/2017   CHOLHDL 4.6 06/22/2017   VLDL 21 06/22/2017   LDLCALC 135 (H) 06/22/2017    Physical Findings: AIMS: Facial and Oral Movements Muscles of Facial Expression: None, normal Lips and Perioral Area: None, normal Jaw: None, normal Tongue: None, normal,Extremity Movements Upper (arms, wrists, hands, fingers): None,  normal Lower (legs, knees, ankles, toes): None, normal, Trunk Movements Neck, shoulders, hips: None, normal, Overall Severity Severity of abnormal movements (highest score from questions above): None, normal Incapacitation due to abnormal movements: None, normal Patient's awareness of abnormal movements (rate only patient's report): No Awareness, Dental Status Current problems with teeth and/or dentures?: No Does patient usually wear dentures?: No  CIWA:    COWS:     Musculoskeletal: Strength & Muscle Tone: within normal limits Gait & Station: normal Patient leans: N/A  Psychiatric Specialty Exam: Physical Exam  Vitals reviewed. Constitutional: He appears well-developed.  Neurological: He is alert.  Psychiatric: He has a normal mood and affect. His behavior is normal.    Review of Systems  Psychiatric/Behavioral: Negative for depression. The patient is nervous/anxious.   All other systems reviewed and are negative.   Blood pressure 113/74, pulse 90, temperature 97.9 F (36.6 C), temperature source Oral, resp. rate 16, height 5\' 2"  (1.575 m), weight 53.1 kg (117 lb), SpO2 98 %.Body mass index is 21.4 kg/m.  General Appearance: Bizarre and Guarded  Eye Contact:  Minimal  Speech:  Clear and Coherent short and abrupt responses    Volume:  Normal  Mood:  Dysphoric  and Irritable  Affect:  Constricted, Flat and Restricted  Thought Process:    Orientation:  Full (Time, Place, and Person)  Thought Content:  Paranoid Ideation  Suicidal Thoughts:  No  Homicidal Thoughts:  No  Memory:  Immediate;   Fair Recent;   Fair Remote;   Fair  Judgement:  Intact  Insight:  Lacking  Psychomotor Activity:  Restlessness  Concentration:  Concentration: Fair  Recall:  Fair  Fund of Knowledge:  Poor  Language:  Poor  Akathisia:  No  Handed:  Right  AIMS (if indicated):     Assets:  Desire for Improvement Resilience Social Support  ADL's:  Intact  Cognition:  WNL  Sleep:  Number of  Hours: 6.75     Treatment Plan Summary: Daily contact with patient to assess and evaluate symptoms and progress in treatment and Medication management   Continue with current treatment plan on 06/23/2017 expect where noted.  MDD with psychosis:   Increased with Lexapro 5 mg  to 10 mg  Increased Risperdal 1 mg QHS to 2 mg   Insomnia   Continue Trazodone 100 mg    Will continue to monitor vitals ,medication compliance and treatment side effects while patient is here.  Reviewed labs prolactin 32.9 elevated ,BAL - , UDS - CSW will start working on disposition.  Patient to participate in therapeutic milieu   Oneta Rack, NP 06/23/2017, 10:51 AM   Agree with NP Progress Note

## 2017-06-23 NOTE — [facility] Group Notes (Signed)
[facility] Group Notes: (Clinical Social Work)   06/23/2017      Type of Therapy:  Group Therapy   Participation Level:  Did Not Attend despite MHT prompting   Emilija Bohman Grossman-Orr, LCSW 06/23/2017, 1:57 PM     

## 2017-06-23 NOTE — [facility] Group Notes (Signed)
[facility] Group Notes:  (Nursing/MHT/Case Management/Adjunct)  Date:  06/23/2017  Time:  2:21 PM    Type of Therapy:  Psychoeducational Skills  Participation Level:  Active  Participation Quality:  Inattentive  Affect:  Anxious and Resistant  Cognitive:  Disorganized  Insight:  Lacking  Engagement in Group:  Lacking  Modes of Intervention:  Education  Summary of Progress/Problems: Pt attended Psychoeducational group with topic anger management.   [name], Gustin Zobrist Shanta 06/23/2017, 2:21 PM

## 2017-06-23 NOTE — [facility] Group Notes (Signed)
[facility] Group Notes:  (Nursing/MHT/Case Management/Adjunct)  Date:  06/23/2017  Time:  9:10 AM  Type of Therapy:  Orientation/Goals group  Participation Level:  Did Not Attend  Participation Quality:  Did Not Attend  Affect:  Did Not Attend  Cognitive:  Did Not Attend  Insight:  None  Engagement in Group:  Did Not Attend  Modes of Intervention:  Did Not Attend  Summary of Progress/Problems: Pt did not attend patient self inventory group/orientation group.   [name], Nickalous Stingley Shanta 06/23/2017, 9:10 AM

## 2017-06-23 NOTE — Progress Notes (Signed)
Patient ID: Garrett HarrisHtee Cheetham, male   DOB: [birth year], 32 y.o.   MRN: [id number]   D: Pt has been very flat and depressed on the unit today, when asked how he felt he told the interpretor he ws unsure on how he felt. When the interpretor asked was he depressed, hopeless, or anxious he reported that he was unsure. The interpretor helped pt with patient self inventory sheet, he reported that he had no goal for today. Pt also reported that his depression was a 5, his hopelessness was a 5, and his anxiety was a 5. Pt reported being negative SI/HI, no AH/VH noted. A: 15 min checks continued for patient safety. R: Pt safety maintained.

## 2017-06-23 NOTE — Progress Notes (Signed)
Adult Psychoeducational Group Note  Date:  06/23/2017 Time:  8:08 PM  Group Topic/Focus:  Wrap-Up Group:   The focus of this group is to help patients review their daily goal of treatment and discuss progress on daily workbooks.  Participation Level:  Active  Participation Quality:  Appropriate  Affect:  Appropriate  Cognitive:  Appropriate  Insight: Appropriate  Engagement in Group:  Engaged  Modes of Intervention:  Discussion  Additional Comments:  The patient expressed that he rates today as well.The patient also said that he attended group.  [name], Valerye Kobus Lee 06/23/2017, 8:08 PM

## 2017-06-23 NOTE — [facility] Group Notes (Signed)
[facility] Group Notes:  (Nursing/MHT/Case Management/Adjunct)  Date:  06/23/2017  Time:  10:19 AM  Type of Therapy:  Psycho-ED  Participation Level:  Active  Participation Quality:  Inattentive  Affect:  Anxious and Resistant  Cognitive:  Disorganized  Insight:  Lacking  Engagement in Group:  Lacking  Modes of Intervention:  Education  Summary of Progress/Problems: Pt did attend anger management group.   [name], Avarose Mervine Shanta 06/23/2017, 10:19 AM

## 2017-06-23 NOTE — Progress Notes (Signed)
D.  Pt presents with interpreter, denies complaints at this time.  Pt attended group, interpreter present.  Pt appears suspicious, possibly paranoid at times.  Pt did take medications without issue.  Denies SI/HI/AVH at this time.  A.  Support and encouragement offered, instructed interpreter to ask Pt if he has any questions for nurse or staff before she left.  Pt denied questions.  R.  Pt remains safe on the unit, will continue to monitor.

## 2017-06-24 MED ORDER — NICOTINE POLACRILEX 2 MG MT GUM
CHEWING_GUM | OROMUCOSAL | Status: AC
  Filled 2017-06-24: qty 1

## 2017-06-24 NOTE — [facility] Group Notes (Signed)
[facility] Group Notes:  (Nursing/MHT/Case Management/Adjunct)  Date:  06/24/2017  Time:  10:11 AM  Type of Therapy:  Psychoeducational Skills  Participation Level:  Active  Participation Quality:  Appropriate  Affect:  Appropriate  Cognitive:  Appropriate  Insight:  Appropriate  Engagement in Group:  Engaged  Modes of Intervention:  Problem-solving  Summary of Progress/Problems: Pt attended Psychoeducational group with top topic healthy support systems.   [name], Meagon Duskin Shanta 06/24/2017, 10:11 AM

## 2017-06-24 NOTE — Progress Notes (Signed)
D.  Pt with interpreter upon approach, denies complaints or concerns at this time.  Pt has stated that he feels isolated on the unit due to language barrier when interpreter not here.  Pt was positive for evening wrap up group with interpreter present.  Pt denies SI/HI/AVH per interpreter.  A.  Support and encouragement offered, encouraged questions and concerns to be voiced while interpreter was present.  R.  Pt remains safe on the unit, will continue to monitor.

## 2017-06-24 NOTE — [facility] Group Notes (Signed)
[facility]  Date/Time:  06/24/2017  11:00AM-12:00PM  Type of Therapy and Topic:  Group Therapy:  Music and Mood  Participation Level:  Active   Description of Group: In this process group, members listened to a variety of genres of music and identified that different types of music evoke different responses.  Patients were encouraged to identify music that was soothing for them and music that was energizing for them.  Patients discussed how this knowledge can help with wellness and recovery in various ways including managing depression and anxiety as well as encouraging healthy sleep habits.    Therapeutic Goals: 1. Patients will explore the impact of different varieties of music on mood 2. Patients will verbalize the thoughts they have when listening to different types of music 3. Patients will identify music that is soothing to them as well as music that is energizing to them 4. Patients will discuss how to use this knowledge to assist in maintaining wellness and recovery 5. Patients will explore the use of music as a coping skill  Summary of Patient Progress:  At the beginning of group, patient expressed that he felt "good" and at the end felt "good" as well.  He did not talk much during group, even with the assistance of his interpreter.  Therapeutic Modalities: Solution Focused Brief Therapy Activity   Ambrose MantleMareida Grossman-Orr, LCSW

## 2017-06-24 NOTE — Progress Notes (Addendum)
[facility] MD Progress Note  06/24/2017 11:00 AM Garrett [name]  MRN:  [id number]   Subjective: Was seen with a vietnamese translator ( [place]) for [employer]. Reports patient is requesting to be "discharge."    Objective: At[place] 32 y.o male continues to present a flat guarded affect.  During this assessment patient seen watching the doors and starting out the windows. When asked about paranoia ideations, Marte denies thoughts. Reports auditory "whispers" states he is unable to understand the whispers.   Continue to present with  thought blocking, fidget  and paranoid during this assessment.  Reports he is taking medications as prescribed and tolerating them well. Increased Risperdal to 2 mg QHS.and is tolerating medications well. Reports a good appetite and states he is sleeping well with medications. Support and encouragement reassurance was provided.   Per HPI assessment note: Garrett [name] (Pronounced "Tee See") is a 32 y/o M originally from [place] (primary language is [name]) with previous psychiatric history of MDD with psychotic features who presented as a voluntary walk in to [facility] accompanied by his pastor with worsening symptoms of psychosis and disorganization. Pt was responding to internal stimuli and he reported increased depression and anxiety in context of losing his job on 05/24/17. In ED pt was guarded with thought blocking, but he was agreeable to inpatient hospitalization. He was started on low dose of risperdal and transferred to [facility]   Principal Problem: MDD (major depressive disorder), recurrent, severe, with psychosis (HCC) Diagnosis:   Patient Active Problem List   Diagnosis Date Noted  . Major depressive disorder, recurrent severe without psychotic features (HCC) [F33.2] 06/20/2017  . MDD (major depressive disorder), recurrent, severe, with psychosis (HCC) [F33.3] 06/19/2017   Total Time spent with patient: 30 minutes  Past Psychiatric History:   Past Medical History:  Past Medical  History:  Diagnosis Date  . Depression    History reviewed. No pertinent surgical history. Family History: History reviewed. No pertinent family history. Family Psychiatric  History:  Social History:  Social History   Substance and Sexual Activity  Alcohol Use Not on file     Social History   Substance and Sexual Activity  Drug Use Not on file    Social History   Socioeconomic History  . Marital status: Single    Spouse name: Not on file  . Number of children: Not on file  . Years of education: Not on file  . Highest education level: Not on file  Occupational History  . Not on file  Social Needs  . Financial resource strain: Not on file  . Food insecurity:    Worry: Not on file    Inability: Not on file  . Transportation needs:    Medical: Not on file    Non-medical: Not on file  Tobacco Use  . Smoking status: Never Smoker  . Smokeless tobacco: Never Used  Substance and Sexual Activity  . Alcohol use: Not on file  . Drug use: Not on file  . Sexual activity: Not on file  Lifestyle  . Physical activity:    Days per week: Not on file    Minutes per session: Not on file  . Stress: Not on file  Relationships  . Social connections:    Talks on phone: Not on file    Gets together: Not on file    Attends religious service: Not on file    Active member of club or organization: Not on file    Attends meetings of clubs  or organizations: Not on file    Relationship status: Not on file  Other Topics Concern  . Not on file  Social History Narrative  . Not on file   Additional Social History:                         Sleep: Fair  Appetite:  Fair  Current Medications: Current Facility-Administered Medications  Medication Dose Route Frequency Provider Last Rate Last Dose  . nicotine polacrilex (NICORETTE) 2 MG gum           . acetaminophen (TYLENOL) tablet 650 mg  650 mg Oral Q6H PRN Charm Rings, NP   650 mg at 06/22/17 1427  . alum & mag  hydroxide-simeth (MAALOX/MYLANTA) 200-200-20 MG/5ML suspension 30 mL  30 mL Oral Q4H PRN Charm Rings, NP      . escitalopram (LEXAPRO) tablet 10 mg  10 mg Oral Daily Oneta Rack, NP   10 mg at 06/24/17 0803  . feeding supplement (ENSURE ENLIVE) (ENSURE ENLIVE) liquid 237 mL  237 mL Oral BID BM Micheal Likens, MD   237 mL at 06/24/17 0931  . LORazepam (ATIVAN) tablet 1 mg  1 mg Oral TID Micheal Likens, MD   1 mg at 06/24/17 0803  . magnesium hydroxide (MILK OF MAGNESIA) suspension 30 mL  30 mL Oral Daily PRN Charm Rings, NP      . multivitamin with minerals tablet 1 tablet  1 tablet Oral Daily Micheal Likens, MD   1 tablet at 06/24/17 0803  . risperiDONE (RISPERDAL) tablet 2 mg  2 mg Oral QHS Oneta Rack, NP   2 mg at 06/23/17 2101  . traZODone (DESYREL) tablet 100 mg  100 mg Oral QHS PRN,MR X 1 Nira Conn A, NP   100 mg at 06/23/17 2101    Lab Results:  No results found for this or any previous visit (from the past 48 hour(s)).  Blood Alcohol level:  Lab Results  Component Value Date   ETH <10 06/18/2017   ETH  12/11/2008    <5        LOWEST DETECTABLE LIMIT FOR SERUM ALCOHOL IS 5 mg/dL FOR MEDICAL PURPOSES ONLY    Metabolic Disorder Labs: Lab Results  Component Value Date   HGBA1C 5.0 06/22/2017   MPG 96.8 06/22/2017   Lab Results  Component Value Date   PROLACTIN 32.9 (H) 06/22/2017   Lab Results  Component Value Date   CHOL 199 06/22/2017   TRIG 107 06/22/2017   HDL 43 06/22/2017   CHOLHDL 4.6 06/22/2017   VLDL 21 06/22/2017   LDLCALC 135 (H) 06/22/2017    Physical Findings: AIMS: Facial and Oral Movements Muscles of Facial Expression: None, normal Lips and Perioral Area: None, normal Jaw: None, normal Tongue: None, normal,Extremity Movements Upper (arms, wrists, hands, fingers): None, normal Lower (legs, knees, ankles, toes): None, normal, Trunk Movements Neck, shoulders, hips: None, normal, Overall  Severity Severity of abnormal movements (highest score from questions above): None, normal Incapacitation due to abnormal movements: None, normal Patient's awareness of abnormal movements (rate only patient's report): No Awareness, Dental Status Current problems with teeth and/or dentures?: No Does patient usually wear dentures?: No  CIWA:    COWS:     Musculoskeletal: Strength & Muscle Tone: within normal limits Gait & Station: normal Patient leans: N/A  Psychiatric Specialty Exam: Physical Exam  Vitals reviewed. Constitutional: He appears well-developed.  Neurological: He is  alert.  Psychiatric: He has a normal mood and affect. His behavior is normal.    Review of Systems  Psychiatric/Behavioral: Negative for depression. The patient is nervous/anxious.   All other systems reviewed and are negative.   Blood pressure 102/60, pulse (!) 119, temperature 97.9 F (36.6 C), resp. rate 18, height 5\' 2"  (1.575 m), weight 53.1 kg (117 lb), SpO2 98 %.Body mass index is 21.4 kg/m.  General Appearance: Guarded paranoid   Eye Contact:  Minimal  Speech:  Clear and Coherent short and abrupt responses    Volume:  Normal  Mood:  Dysphoric and Irritable  Affect:  Constricted, Flat and Restricted  Thought Process:    Orientation:  Full (Time, Place, and Person)  Thought Content:  Paranoid Ideation  Suicidal Thoughts:  No  Homicidal Thoughts:  No  Memory:  Immediate;   Fair Recent;   Fair Remote;   Fair  Judgement:  Intact  Insight:  Lacking  Psychomotor Activity:  Restlessness  Concentration:  Concentration: Fair  Recall:  Fair  Fund of Knowledge:  Poor  Language:  Poor  Akathisia:  No  Handed:  Right  AIMS (if indicated):     Assets:  Desire for Improvement Resilience Social Support  ADL's:  Intact  Cognition:  WNL  Sleep:  Number of Hours: 6.25     Treatment Plan Summary: Daily contact with patient to assess and evaluate symptoms and progress in treatment and Medication  management   Continue with current treatment plan on 06/24/2017 expect where noted.  MDD with psychosis:   Continue  with Lexapro  to 10 mg  Continue Risperdal 1 mg QHS to 2 mg   Insomnia   Continue Trazodone 100 mg    Will continue to monitor vitals ,medication compliance and treatment side effects while patient is here.  Reviewed labs prolactin 32.9 elevated ,BAL - , UDS - CSW will start working on disposition.  Patient to participate in therapeutic milieu   Oneta Rackanika N Lewis, NP 06/24/2017, 11:00 AM Agree with NP Progress Note

## 2017-06-24 NOTE — Progress Notes (Signed)
Patient ID: Garrett [name], male   DOB: Aug 22, 1985, 32 y.o.   MRN: [id number]   D: Pt has been very flat and depressed on the unit today, when asked how he felt he told the interpretor he was alright. When the interpretor asked was he depressed, hopeless, or anxious he reported that he was alright. The interpretor helped pt with patient self inventory sheet, he reported that he had no goal for today. Pt also reported that his depression was a 0, his hopelessness was a 0, and his anxiety was a 0. Pt did attend all groups today and shared with the group who his healthy support system was. Pt reported being negative SI/HI, no AH/VH noted. A: 15 min checks continued for patient safety. R: Pt safety maintained.

## 2017-06-24 NOTE — [facility] Group Notes (Signed)
[facility] Group Notes:  (Nursing/MHT/Case Management/Adjunct)  Date:  06/24/2017  Time:  9:20 AM  Type of Therapy:  Patient self inventory group and goals group  Participation Level:  Active  Participation Quality:  Inattentive  Affect:  Anxious and Resistant  Cognitive:  Disorganized  Insight:  Lacking  Engagement in Group:  Lacking  Modes of Intervention:  Education  Summary of Progress/Problems: Pt did attend patient self inventory group and goals group.   [name], Madyx Delfin Shanta 06/24/2017, 9:20 AM

## 2017-06-25 MED ORDER — LORAZEPAM 0.5 MG PO TABS
0.5000 mg | ORAL_TABLET | Freq: Three times a day (TID) | ORAL | Status: DC
  Administered 2017-06-25 – 2017-06-26 (×2): 0.5 mg via ORAL
  Filled 2017-06-25 (×2): qty 1

## 2017-06-25 MED ORDER — RISPERIDONE 1 MG PO TABS
1.0000 mg | ORAL_TABLET | ORAL | Status: DC
  Administered 2017-06-26 – 2017-06-29 (×4): 1 mg via ORAL
  Filled 2017-06-25 (×5): qty 1

## 2017-06-25 MED ORDER — RISPERIDONE 2 MG PO TABS
2.0000 mg | ORAL_TABLET | Freq: Every day | ORAL | Status: DC
  Administered 2017-06-25 – 2017-06-28 (×3): 2 mg via ORAL
  Filled 2017-06-25 (×6): qty 1

## 2017-06-25 NOTE — Progress Notes (Addendum)
Recreation Therapy Notes  Date: 3.25.19 Time: 10:00 am Location: [address] Dayroom   Group Topic: Coping Skills   Goal Area(s) Addresses:  Goal 1.1: To improve coping skills  . Group will identify at least one new coping skill  . Group will increase awareness on coping skills  . Group will be able to identify how coping skills can improve their wellness  Behavioral Response: Appropriate   Intervention: Crafts    Activity: Coping Skills Vision Board- Patients created a coping skills vision board using the magazines provided. Patients were expected to find at least three new coping skills as well as identify three coping skills they already have. Once the activity was completed, patients shared their vision board with the group.   Education: PharmacologistCoping skills, Self-Esteem   Education Outcome: Acknowledges education  Clinical Observations/Feedback: Patient attended and participated appropriately during Recreation Therapy group treatment. Patient was able to identify three coping skills with the help of interpreter   Sheryle HailDarian Jhamir [name], Recreation Therapy Intern  Sheryle HailDarian Jakeem [name] 06/25/2017 8:49 AM

## 2017-06-25 NOTE — Progress Notes (Signed)
DAR NOTE: Patient presents with flat affect and depressed mood.  Denies suicidal thoughts, pain, auditory and visual hallucinations.  Rates depression at 0, hopelessness at 0, and anxiety at 0.  Maintained on routine safety checks.  Medications given as prescribed.  Support and encouragement offered as needed.  Attended group and participated. Patient visible in milieu for therapy.  No interaction with staff or peers due to language barrier.  Offered no complaint.

## 2017-06-25 NOTE — Progress Notes (Signed)
Adult Psychoeducational Group Note  Date:  06/25/2017 Time:  9:57 PM  Group Topic/Focus:  Wrap-Up Group:   The focus of this group is to help patients review their daily goal of treatment and discuss progress on daily workbooks.  Participation Level:  Minimal  Participation Quality:  Appropriate  Affect:  Flat  Cognitive:  Lacking  Insight: Limited  Engagement in Group:  Limited  Modes of Intervention:  Socialization and Support  Additional Comments:  Pt attended and participated in group tonight. He reports that today was good and was able to speak with the doctor. He went to meals and attended groups throughout the day. He reported that he interacted with peers.  Lita MainsFrancis, Rechy Bost [facility] LPDacosta 06/25/2017, 9:57 PM

## 2017-06-25 NOTE — Progress Notes (Signed)
[facility] MD Progress Note  06/25/2017 12:37 PM Garrett [name]  MRN:  [id number] Subjective:    Garrett [name] (Prounounced "Tee See") is [name] 32 y/o M originally from [place] (primary language is Garrett [name]) with previous psychiatric history of MDD with psychotic features who presented as [name] voluntary walk in to [facility] accompanied by his pastor with worsening symptoms of psychosis and disorganization. Pt was responding to internal stimuli and he reported increased depression and anxiety in context of losing his job on 05/24/17. In ED pt was guarded with thought blocking, but he was agreeable to inpatient hospitalization. He was started on low dose of risperdal and transferred to [facility], and he dose was titrated up during his stay. Pt was started on scheduled ativan due to symptoms of catatonia and poor intake, and he had some improvement of his presenting symptoms.   Today upon interview, pt was evaluated with assistance of Garrett [name] Interpreter (Garrett [name]). Pt was asked how is doing today, and he replied, "good." He has no specific concerns today. He denies any physical complaints. He is sleeping well and his appetite is good. He continues to endorse AH saying that they occur "sometimes" and he describes them as hearing "conversations of others" in the background. He does not feel they are overly bothersome or intrusive, and he is able to focus on whatever task is at hand. He endorses VH this AM of seeing "spirits passing by." He denies SI/HI. He feels mildly drowsy during the daytime. Discussed with patient about treatment options. He is in agreement to add [name] dose of risperdal in the AM to address symptoms of psychosis and to reduce dose of ativan throughout the day to reduce daytime sedation. He had no further questions, comments, or concerns.   Principal Problem: MDD (major depressive disorder), recurrent, severe, with psychosis (HCC) Diagnosis:   Patient Active Problem List   Diagnosis Date Noted  . Major depressive disorder,  recurrent severe without psychotic features (HCC) [F33.2] 06/20/2017  . MDD (major depressive disorder), recurrent, severe, with psychosis (HCC) [F33.3] 06/19/2017   Total Time spent with patient: 30 minutes  Past Psychiatric History: see H&P  Past Medical History:  Past Medical History:  Diagnosis Date  . Depression    History reviewed. No pertinent surgical history. Family History: History reviewed. No pertinent family history. Family Psychiatric  History: see H&P Social History:  Social History   Substance and Sexual Activity  Alcohol Use Not on file     Social History   Substance and Sexual Activity  Drug Use Not on file    Social History   Socioeconomic History  . Marital status: Single    Spouse name: Not on file  . Number of children: Not on file  . Years of education: Not on file  . Highest education level: Not on file  Occupational History  . Not on file  Social Needs  . Financial resource strain: Not on file  . Food insecurity:    Worry: Not on file    Inability: Not on file  . Transportation needs:    Medical: Not on file    Non-medical: Not on file  Tobacco Use  . Smoking status: Never Smoker  . Smokeless tobacco: Never Used  Substance and Sexual Activity  . Alcohol use: Not on file  . Drug use: Not on file  . Sexual activity: Not on file  Lifestyle  . Physical activity:    Days per week: Not on file    Minutes  per session: Not on file  . Stress: Not on file  Relationships  . Social connections:    Talks on phone: Not on file    Gets together: Not on file    Attends religious service: Not on file    Active member of club or organization: Not on file    Attends meetings of clubs or organizations: Not on file    Relationship status: Not on file  Other Topics Concern  . Not on file  Social History Narrative  . Not on file   Additional Social History:                         Sleep: Good  Appetite:  Good  Current  Medications: Current Facility-Administered Medications  Medication Dose Route Frequency Provider Last Rate Last Dose  . acetaminophen (TYLENOL) tablet 650 mg  650 mg Oral Q6H PRN Garrett Rings, NP   650 mg at 06/22/17 1427  . alum & mag hydroxide-simeth (MAALOX/MYLANTA) 200-200-20 MG/5ML suspension 30 mL  30 mL Oral Q4H PRN Garrett Rings, NP      . escitalopram (LEXAPRO) tablet 10 mg  10 mg Oral Daily Garrett Rack, NP   10 mg at 06/25/17 0931  . feeding supplement (ENSURE ENLIVE) (ENSURE ENLIVE) liquid 237 mL  237 mL Oral BID BM Garrett Likens, MD   237 mL at 06/25/17 0932  . LORazepam (ATIVAN) tablet 0.5 mg  0.5 mg Oral TID Garrett Loa T, MD      . magnesium hydroxide (MILK OF MAGNESIA) suspension 30 mL  30 mL Oral Daily PRN Garrett Rings, NP      . multivitamin with minerals tablet 1 tablet  1 tablet Oral Daily Garrett Likens, MD   1 tablet at 06/25/17 0931  . [START ON 06/26/2017] risperiDONE (RISPERDAL) tablet 1 mg  1 mg Oral BH-q7a Garrett Likens, MD       And  . risperiDONE (RISPERDAL) tablet 2 mg  2 mg Oral QHS Garrett Likens, MD      . traZODone (DESYREL) tablet 100 mg  100 mg Oral QHS PRN,MR X 1 Garrett Conn A, NP   100 mg at 06/24/17 2054    Lab Results: No results found for this or any previous visit (from the past 48 hour(s)).  Blood Alcohol level:  Lab Results  Component Value Date   ETH <10 06/18/2017   ETH  12/11/2008    <5        LOWEST DETECTABLE LIMIT FOR SERUM ALCOHOL IS 5 mg/dL FOR MEDICAL PURPOSES ONLY    Metabolic Disorder Labs: Lab Results  Component Value Date   HGBA1C 5.0 06/22/2017   MPG 96.8 06/22/2017   Lab Results  Component Value Date   PROLACTIN 32.9 (H) 06/22/2017   Lab Results  Component Value Date   CHOL 199 06/22/2017   TRIG 107 06/22/2017   HDL 43 06/22/2017   CHOLHDL 4.6 06/22/2017   VLDL 21 06/22/2017   LDLCALC 135 (H) 06/22/2017    Physical Findings: AIMS: Facial and  Oral Movements Muscles of Facial Expression: None, normal Lips and Perioral Area: None, normal Jaw: None, normal Tongue: None, normal,Extremity Movements Upper (arms, wrists, hands, fingers): None, normal Lower (legs, knees, ankles, toes): None, normal, Trunk Movements Neck, shoulders, hips: None, normal, Overall Severity Severity of abnormal movements (highest score from questions above): None, normal Incapacitation due to abnormal movements: None, normal Patient's awareness of abnormal  movements (rate only patient's report): No Awareness, Dental Status Current problems with teeth and/or dentures?: No Does patient usually wear dentures?: No  CIWA:    COWS:     Musculoskeletal: Strength & Muscle Tone: within normal limits Gait & Station: normal Patient leans: N/[name]  Psychiatric Specialty Exam: Physical Exam  Nursing note and vitals reviewed.   Review of Systems  Constitutional: Negative for chills and fever.  Respiratory: Negative for cough and shortness of breath.   Cardiovascular: Negative for chest pain.  Gastrointestinal: Negative for abdominal pain, heartburn, nausea and vomiting.  Psychiatric/Behavioral: Positive for hallucinations. Negative for depression and suicidal ideas. The patient is not nervous/anxious and does not have insomnia.     Blood pressure 102/60, pulse 90, temperature 97.9 F (36.6 C), resp. rate 18, height 5\' 2"  (1.575 m), weight 53.1 kg (117 lb), SpO2 98 %.Body mass index is 21.4 kg/m.  General Appearance: Casual and Disheveled  Eye Contact:  Good  Speech:  Clear and Coherent and Normal Rate (as per interpreter)  Volume:  Normal  Mood:  Euthymic  Affect:  Appropriate, Congruent and Flat  Thought Process:  Coherent and Goal Directed  Orientation:  Full (Time, Place, and Person)  Thought Content:  Hallucinations: Auditory Visual  Suicidal Thoughts:  No  Homicidal Thoughts:  No  Memory:  Immediate;   Fair Recent;   Fair Remote;   Fair  Judgement:   Fair  Insight:  Fair  Psychomotor Activity:  Normal  Concentration:  Concentration: Fair  Recall:  FiservFair  Fund of Knowledge:  Fair  Language:  Fair  Akathisia:  No  Handed:    AIMS (if indicated):     Assets:  Communication Skills Resilience Social Support  ADL's:  Intact  Cognition:  WNL  Sleep:  Number of Hours: 5.5    Treatment Plan Summary: Daily contact with patient to assess and evaluate symptoms and progress in treatment and Medication management    -Continue inpatient hospitalization  - MDD with psychotic features             - Continue lexapro 10mg  po qDay             - Change risperdal 2mg  po qhs to risperdal 1mg  po qAM + 2mg  po qhs  - Catatonic features             - Change ativan 1mg  po TID to ativan 0.5mg  po TID  - Poor nutritional intake             - Continue Ensure BID between meals  - Insomnia             - Continue trazodone 100mg  po qhs prn insomnia (may repeat x1)  -Encourage participation in groups and therapeutic milieu  -Disposition planning will be ongoing    Garrett Likenshristopher [name] Cloyce Blankenhorn, MD 06/25/2017, 12:37 PM

## 2017-06-25 NOTE — [facility] Group Notes (Signed)
  [facility]  Date/Time: 06/25/2017 2:41 PM  Type of Therapy and Topic:  Group Therapy:  Overcoming Obstacles  Participation Level:  Minimal  Description of Group:    In this group patients will be encouraged to explore what they see as obstacles to their own wellness and recovery. They will be guided to discuss their thoughts, feelings, and behaviors related to these obstacles. The group will process together ways to cope with barriers, with attention given to specific choices patients can make. Each patient will be challenged to identify changes they are motivated to make in order to overcome their obstacles. This group will be process-oriented, with patients participating in exploration of their own experiences as well as giving and receiving support and challenge from other group members.   Therapeutic Goals: 1. Patient will identify personal and current obstacles as they relate to admission. 2. Patient will identify barriers that currently interfere with their wellness or overcoming obstacles.  3. Patient will identify feelings, thought process and behaviors related to these barriers. 4. Patient will identify two changes they are willing to make to overcome these obstacles:    Summary of Patient Progress Group members participated in this activity by defining obstacles and exploring feelings related to obstacles. Group members discussed examples of positive and negative obstacles. Group members identified the obstacle they feel most related to their admission and processed what they could do to overcome and what motivates them to accomplish this goal. Patient stayed for the whole group. Patient participated appropriately, when he was addressed individually. Patient declined to elaborate further, aside from identifying his current obstacle of housing.     Therapeutic Modalities:   Cognitive Behavioral Therapy Solution Focused Therapy Motivational Interviewing Relapse  Prevention Therapy  Magdalene MollyPerri A Azarion Hove, LCSW

## 2017-06-26 NOTE — Progress Notes (Signed)
Psychoeducational Group Note  Date:  06/26/2017 Time:  2121  Group Topic/Focus:  Wrap-Up Group:   The focus of this group is to help patients review their daily goal of treatment and discuss progress on daily workbooks.  Participation Level: Did Not Attend  Participation Quality:  Not Applicable  Affect:  Not Applicable  Cognitive:  Not Applicable  Insight:  Not Applicable  Engagement in Group: Not Applicable  Additional Comments:  The patient did not attend group since he elected to go back to sleep.   Hazle CocaGOODMAN, Yasaman Kolek S 06/26/2017, 9:22 PM

## 2017-06-26 NOTE — Plan of Care (Signed)
  Problem: Safety: Goal: Ability to disclose and discuss suicidal ideas will improve Outcome: Progressing Note:  Pt denies SI at this time

## 2017-06-26 NOTE — Progress Notes (Signed)
DAR NOTE: Patient presents with flat affect and depressed mood.  Denies suicidal thoughts, pain, auditory and visual hallucinations.  Rates depression at 0, hopelessness at 0, and anxiety at 0.  Maintained on routine safety checks.  Medications given as prescribed.  Support and encouragement offered as needed.  Patient remained in his room most of this shift sleeping.  Offered no complaint.

## 2017-06-26 NOTE — Progress Notes (Signed)
Recreation Therapy Notes  Date: 3.26.19 Time: 10:00 a.m.  Location: [address] Dayroom  Group Topic: Social Skills   Goal Area(s) Addresses:  Goal 1.1: To improve social skills  - Patient will participate in Recreation Therapy group tx. - Patient will identify the importance of good social skills  - Patient will communicate with peers during group activity   Intervention: Game   Activity: "I have it": Group were handed all the cards from a deck. The first patient rolled the dice to try to match the number of any of the cards they have. If they have it, they say I've got it and throws in the center, and continues to roll, if they don't have it, then it goes around to the next player that does.   Education: Social Skills   Education Outcome: Acknowledges Education  Clinical Observations/Feedback: Patient did not attend   Sheryle HailDarian Kalil Woessner, Recreation Therapy Intern   Sheryle HailDarian Rome Schlauch 06/26/2017 8:47 AM

## 2017-06-26 NOTE — [facility] Group Notes (Signed)
LCSW Group Therapy Notes 06/26/2017 1:15pm Type of Therapy and Topic:  Group Therapy:  Communication Participation Level:  None  Description of Group: Patients will identify how individuals communicate with one another appropriately and inappropriately.  Patients will be guided to discuss their thoughts, feelings and behaviors related to barriers when communicating.  The group will process together ways to execute positive and appropriate communication with attention given to how one uses behavior, tone and body language.  Patients will be encouraged to reflect on a situation where they were successfully able to communicate and what made this example successful.  Group will identify specific changes they are motivated to make in order to overcome communication barriers with self, peers, authority, and parents.  This group will be process-oriented with patients participating in exploration of their own experiences, giving and receiving support, and challenging self and other group members.   Therapeutic Goals 1. Patient will identify how people communicate (body language, facial expression, and electronics).  Group will also discuss tone, voice and how these impact what is communicated and what is received. 2. Patient will identify feelings (such as fear or worry), thought process and behaviors related to why people internalize feelings rather than express self openly. 3. Patient will identify two changes they are willing to make to overcome communication barriers 4. Members will then practice through role play how to communicate using I statements, I feel statements, and acknowledging feelings rather than displacing feelings on others Summary of Patient Progress: Stayed the enitre time.  Declined to contribute when invited.  Therapeutic Modalities Cognitive Behavioral Therapy Motivational Interviewing Solution Focused Therapy  Garrett [name] B [name] [name], LCSW 06/26/2017 1:00 PM

## 2017-06-26 NOTE — Progress Notes (Signed)
[facility]  06/26/2017 12:24 PM Shirl HarrisHtee Driskill  MRN:  [id number] Subjective:    Garrett [name] (Prounounced "Tee See") is a 32 y/o M originally from [place] (primary language is Clydie BraunKaren) with previous psychiatric history of MDD with psychotic features who presented as a voluntary walk in to [facility] accompanied by his pastor with worsening symptoms of psychosis and disorganization. Pt was responding to internal stimuli and he reported increased depression and anxiety in context of losing his job on 05/24/17. In ED pt was guarded with thought blocking, but he was agreeable to inpatient hospitalization. He was started on low dose of risperdal and [facility], and he dose was titrated up during his stay.Pt was started on scheduled ativan due to symptoms of catatonia and poor intake, and he had some improvement of his presenting symptoms.RN staff have noted that pt has been participating in groups (to the best of his ability) and his oral intake of food/hydration has improved during his stay. Yesterday dose of ativan was reduced to address complaint of daytime sedation.  Today upon interview, pt was evaluated with assistance of Clydie BraunKaren Interpreter ([name]). Pt was asked how is doing today, and he replies, "good." He has no specific complaint, aside from still having some ongoing fatigue during the daytime. Today he denies SI/HI/AH/VH. Yesterday he had reported seeing "spirits" but he notes that has resolved today. He is sleeping well. His appetite is good. He is tolerating his current treatment regimen without difficulty or side effects. Discussed with patient about discontinuing ativan as he has not been demonstrating any signs/symptoms of catatonia in recent days, and pt was in agreement. He agrees to continue risperdal at the current dose. Pt had no further questions, comments, or concerns.  Principal Problem: MDD (major depressive disorder), recurrent, severe, with psychosis (HCC) Diagnosis:   Patient  Active Problem List   Diagnosis Date Noted  . Major depressive disorder, recurrent severe without psychotic features (HCC) [F33.2] 06/20/2017  . MDD (major depressive disorder), recurrent, severe, with psychosis (HCC) [F33.3] 06/19/2017   Total Time spent with patient: 30 minutes  Past Psychiatric History: see H&P  Past Medical History:  Past Medical History:  Diagnosis Date  . Depression    History reviewed. No pertinent surgical history. Family History: History reviewed. No pertinent family history. Family Psychiatric  History: see H&P Social History:  Social History   Substance and Sexual Activity  Alcohol Use Not on file     Social History   Substance and Sexual Activity  Drug Use Not on file    Social History   Socioeconomic History  . Marital status: Single    Spouse name: Not on file  . Number of children: Not on file  . Years of education: Not on file  . Highest education level: Not on file  Occupational History  . Not on file  Social Needs  . Financial resource strain: Not on file  . Food insecurity:    Worry: Not on file    Inability: Not on file  . Transportation needs:    Medical: Not on file    Non-medical: Not on file  Tobacco Use  . Smoking status: Never Smoker  . Smokeless tobacco: Never Used  Substance and Sexual Activity  . Alcohol use: Not on file  . Drug use: Not on file  . Sexual activity: Not on file  Lifestyle  . Physical activity:    Days per week: Not on file    Minutes  per session: Not on file  . Stress: Not on file  Relationships  . Social connections:    Talks on phone: Not on file    Gets together: Not on file    Attends religious service: Not on file    Active member of club or organization: Not on file    Attends meetings of clubs or organizations: Not on file    Relationship status: Not on file  Other Topics Concern  . Not on file  Social History Narrative  . Not on file   Additional Social History:                          Sleep: Good  Appetite:  Good  Current Medications: Current Facility-Administered Medications  Medication Dose Route Frequency Provider Last Rate Last Dose  . acetaminophen (TYLENOL) tablet 650 mg  650 mg Oral Q6H PRN Charm Rings, NP   650 mg at 06/22/17 1427  . alum & mag hydroxide-simeth (MAALOX/MYLANTA) 200-200-20 MG/5ML suspension 30 mL  30 mL Oral Q4H PRN Charm Rings, NP      . escitalopram (LEXAPRO) tablet 10 mg  10 mg Oral Daily Oneta Rack, NP   10 mg at 06/26/17 0729  . feeding supplement (ENSURE ENLIVE) (ENSURE ENLIVE) liquid 237 mL  237 mL Oral BID BM Micheal Likens, MD   237 mL at 06/26/17 1043  . magnesium hydroxide (MILK OF MAGNESIA) suspension 30 mL  30 mL Oral Daily PRN Charm Rings, NP      . multivitamin with minerals tablet 1 tablet  1 tablet Oral Daily Micheal Likens, MD   1 tablet at 06/26/17 0730  . risperiDONE (RISPERDAL) tablet 1 mg  1 mg Oral Veatrice Kells, MD   1 mg at 06/26/17 0615   And  . risperiDONE (RISPERDAL) tablet 2 mg  2 mg Oral QHS Micheal Likens, MD   2 mg at 06/25/17 2149  . traZODone (DESYREL) tablet 100 mg  100 mg Oral QHS PRN,MR X 1 Nira Conn A, NP   100 mg at 06/25/17 2149    Lab Results: No results found for this or any previous visit (from the past 48 hour(s)).  Blood Alcohol level:  Lab Results  Component Value Date   ETH <10 06/18/2017   ETH  12/11/2008    <5        LOWEST DETECTABLE LIMIT FOR SERUM ALCOHOL IS 5 mg/dL FOR MEDICAL PURPOSES ONLY    Metabolic Disorder Labs: Lab Results  Component Value Date   HGBA1C 5.0 06/22/2017   MPG 96.8 06/22/2017   Lab Results  Component Value Date   PROLACTIN 32.9 (H) 06/22/2017   Lab Results  Component Value Date   CHOL 199 06/22/2017   TRIG 107 06/22/2017   HDL 43 06/22/2017   CHOLHDL 4.6 06/22/2017   VLDL 21 06/22/2017   LDLCALC 135 (H) 06/22/2017    Physical Findings: AIMS: Facial and Oral  Movements Muscles of Facial Expression: None, normal Lips and Perioral Area: None, normal Jaw: None, normal Tongue: None, normal,Extremity Movements Upper (arms, wrists, hands, fingers): None, normal Lower (legs, knees, ankles, toes): None, normal, Trunk Movements Neck, shoulders, hips: None, normal, Overall Severity Severity of abnormal movements (highest score from questions above): None, normal Incapacitation due to abnormal movements: None, normal Patient's awareness of abnormal movements (rate only patient's report): No Awareness, Dental Status Current problems with teeth and/or dentures?: No Does  patient usually wear dentures?: No  CIWA:    COWS:     Musculoskeletal: Strength & Muscle Tone: within normal limits Gait & Station: normal Patient leans: N/A  Psychiatric Specialty Exam: Physical Exam  Nursing note and vitals reviewed.   Review of Systems  Constitutional: Negative for chills and fever.  Respiratory: Negative for cough and shortness of breath.   Cardiovascular: Negative for chest pain.  Gastrointestinal: Negative for abdominal pain, heartburn, nausea and vomiting.  Psychiatric/Behavioral: Negative for depression, hallucinations and suicidal ideas. The patient is not nervous/anxious.     Blood pressure (!) 98/56, pulse (!) 120, temperature 98 F (36.7 C), temperature source Oral, resp. rate 18, height 5\' 2"  (1.575 m), weight 53.1 kg (117 lb), SpO2 98 %.Body mass index is 21.4 kg/m.  General Appearance: Casual and Disheveled  Eye Contact:  Good  Speech:  Clear and Coherent and Normal Rate (as per interpreter)  Volume:  Normal  Mood:  Euthymic  Affect:  Appropriate, Congruent and Flat  Thought Process:  Coherent and Goal Directed  Orientation:  Full (Time, Place, and Person)  Thought Content:  Logical  Suicidal Thoughts:  No  Homicidal Thoughts:  No  Memory:  Immediate;   Fair Recent;   Fair Remote;   Fair  Judgement:  Fair  Insight:  Fair  Psychomotor  Activity:  Normal  Concentration:  Concentration: Fair  Recall:  [employer] of Knowledge:  Fair  Language:  Fair  Akathisia:  No  Handed:    AIMS (if indicated):     Assets:  Desire for Improvement Housing Resilience Social Support  ADL's:  Intact  Cognition:  WNL  Sleep:  Number of Hours: 6.75   Treatment Plan Summary: Daily contact with patient to assess and evaluate symptoms and progress in treatment and Medication management    -Continue inpatient hospitalization  - MDD with psychotic features - Continue lexapro 10mg  po qDay - Continue risperdal 1mg  po qAM + 2mg  po qhs  - Catatonic features - Discontinue ativan 0.5mg  po TID  - Poor nutritional intake - Continue Ensure BID between meals  - Insomnia - Continue trazodone 100mg  po qhs prn insomnia (may repeat x1)  -Encourage participation in groups and therapeutic milieu  -Disposition planning will be ongoing  Micheal Likens, MD 06/26/2017, 12:24 PM

## 2017-06-26 NOTE — Progress Notes (Signed)
D: Pt denies SI/HI/AVH. Pt is pleasant and cooperative.   A: Pt was offered support and encouragement. Pt was given scheduled medications. Pt was encourage to attend groups. Q 15 minute checks were done for safety.   R: safety maintained on unit.

## 2017-06-26 NOTE — Plan of Care (Signed)
  Problem: Activity: Goal: Sleeping patterns will improve Outcome: Progressing Note:  Pt slept over 5 hrs last night

## 2017-06-26 NOTE — Progress Notes (Signed)
D: Pt denies SI/HI/AVH. Pt is pleasant and cooperative. Pt stated he was doing better. Pt visible in dayroom and on the unit this evening.   A: Pt was offered support and encouragement. Pt was given scheduled medications. Pt was encourage to attend groups. Q 15 minute checks were done for safety.   R: safety maintained on unit.

## 2017-06-27 MED ORDER — LORAZEPAM 0.5 MG PO TABS
0.5000 mg | ORAL_TABLET | Freq: Three times a day (TID) | ORAL | Status: DC
  Administered 2017-06-27 – 2017-06-29 (×6): 0.5 mg via ORAL
  Filled 2017-06-27 (×6): qty 1

## 2017-06-27 NOTE — Progress Notes (Signed)
D: Pt denies SI/HI/AVH. Pt is pleasant and cooperative. Pt a little more vocal . Pt stated he was less dizzy this evening.   A: Pt was offered support and encouragement. Pt was given scheduled medications. Pt was encourage to attend groups. Q 15 minute checks were done for safety.   R:Pt attends groups and interacts well with peers and staff. Pt is taking medication. Pt receptive to treatment and safety maintained on unit.

## 2017-06-27 NOTE — Progress Notes (Signed)
[facility] MD Progress Note  06/27/2017 12:20 PM Jarret Torre  MRN:  [id number] Subjective:    Garrett [name] (Prounounced "Tee See") is a 32 y/o M originally from [place] (primary language is [name]) with previous psychiatric history of MDD with psychotic features who presented as a voluntary walk in to [facility] accompanied by his pastor with worsening symptoms of psychosis and disorganization. Pt was responding to internal stimuli and he reported increased depression and anxiety in context of losing his job on 05/24/17. In ED pt was guarded with thought blocking, but he was agreeable to inpatient hospitalization. He was started on low dose of risperdal and transferred to [facility], and he dose was titrated up during his stay.Pt was started on scheduled ativan due to symptoms of catatonia and poor intake, and he had some improvement of his presenting symptoms.RN staff have noted that pt has been participating in groups (to the best of his ability) and his oral intake of food/hydration has improved during his stay.Ativan was gradually reduced and then discontinued yesterday to address complaint of daytime sedation and because pt's catatonic features had begun to resolve.   As per RN report today, pt is more flat and withdrawn today. He did not come up to get his medications despite direction from RN staff and his intake of food and hydration have been poor.  Today upon interview, pt was evaluated with assistance of [name] Interpreter ([name]). He is lying in bed and staring blankly at the wall during interview. His responses were generally minimal and he mostly was able to respond with nods and shaking his head. When given open ended questions, pt remained silent. Pt was asked how is doing today, and he remained silent in response. He indicated by shaking his head that he has no specific concerns today and that he is not being bothered by anything. He denies SI/HI/AH/VH. Pt was encouraged to improve his oral intake of food and  water, and he nodded in understanding. Discussed with patient that we will restart previous medication of ativan to address his worsening catatonic features. Pt had no further questions, comments, or concerns.   Principal Problem: MDD (major depressive disorder), recurrent, severe, with psychosis (HCC) Diagnosis:   Patient Active Problem List   Diagnosis Date Noted  . Major depressive disorder, recurrent severe without psychotic features (HCC) [F33.2] 06/20/2017  . MDD (major depressive disorder), recurrent, severe, with psychosis (HCC) [F33.3] 06/19/2017   Total Time spent with patient: 30 minutes  Past Psychiatric History: see H&P  Past Medical History:  Past Medical History:  Diagnosis Date  . Depression    History reviewed. No pertinent surgical history. Family History: History reviewed. No pertinent family history. Family Psychiatric  History: see H&P Social History:  Social History   Substance and Sexual Activity  Alcohol Use Not on file     Social History   Substance and Sexual Activity  Drug Use Not on file    Social History   Socioeconomic History  . Marital status: Single    Spouse name: Not on file  . Number of children: Not on file  . Years of education: Not on file  . Highest education level: Not on file  Occupational History  . Not on file  Social Needs  . Financial resource strain: Not on file  . Food insecurity:    Worry: Not on file    Inability: Not on file  . Transportation needs:    Medical: Not on file  Non-medical: Not on file  Tobacco Use  . Smoking status: Never Smoker  . Smokeless tobacco: Never Used  Substance and Sexual Activity  . Alcohol use: Not on file  . Drug use: Not on file  . Sexual activity: Not on file  Lifestyle  . Physical activity:    Days per week: Not on file    Minutes per session: Not on file  . Stress: Not on file  Relationships  . Social connections:    Talks on phone: Not on file    Gets together: Not on  file    Attends religious service: Not on file    Active member of club or organization: Not on file    Attends meetings of clubs or organizations: Not on file    Relationship status: Not on file  Other Topics Concern  . Not on file  Social History Narrative  . Not on file   Additional Social History:                         Sleep: Good  Appetite:  Poor  Current Medications: Current Facility-Administered Medications  Medication Dose Route Frequency Provider Last Rate Last Dose  . acetaminophen (TYLENOL) tablet 650 mg  650 mg Oral Q6H PRN Charm Rings, NP   650 mg at 06/22/17 1427  . alum & mag hydroxide-simeth (MAALOX/MYLANTA) 200-200-20 MG/5ML suspension 30 mL  30 mL Oral Q4H PRN Charm Rings, NP      . escitalopram (LEXAPRO) tablet 10 mg  10 mg Oral Daily Oneta Rack, NP   10 mg at 06/27/17 0813  . feeding supplement (ENSURE ENLIVE) (ENSURE ENLIVE) liquid 237 mL  237 mL Oral BID BM Micheal Likens, MD   237 mL at 06/26/17 1043  . LORazepam (ATIVAN) tablet 0.5 mg  0.5 mg Oral TID Jolyne Loa T, MD      . magnesium hydroxide (MILK OF MAGNESIA) suspension 30 mL  30 mL Oral Daily PRN Charm Rings, NP      . multivitamin with minerals tablet 1 tablet  1 tablet Oral Daily Micheal Likens, MD   1 tablet at 06/27/17 0815  . risperiDONE (RISPERDAL) tablet 1 mg  1 mg Oral Veatrice Kells, MD   1 mg at 06/27/17 0815   And  . risperiDONE (RISPERDAL) tablet 2 mg  2 mg Oral QHS Micheal Likens, MD   2 mg at 06/25/17 2149  . traZODone (DESYREL) tablet 100 mg  100 mg Oral QHS PRN,MR X 1 Nira Conn A, NP   100 mg at 06/25/17 2149    Lab Results: No results found for this or any previous visit (from the past 48 hour(s)).  Blood Alcohol level:  Lab Results  Component Value Date   ETH <10 06/18/2017   ETH  12/11/2008    <5        LOWEST DETECTABLE LIMIT FOR SERUM ALCOHOL IS 5 mg/dL FOR MEDICAL PURPOSES ONLY     Metabolic Disorder Labs: Lab Results  Component Value Date   HGBA1C 5.0 06/22/2017   MPG 96.8 06/22/2017   Lab Results  Component Value Date   PROLACTIN 32.9 (H) 06/22/2017   Lab Results  Component Value Date   CHOL 199 06/22/2017   TRIG 107 06/22/2017   HDL 43 06/22/2017   CHOLHDL 4.6 06/22/2017   VLDL 21 06/22/2017   LDLCALC 135 (H) 06/22/2017    Physical Findings: AIMS: Facial  and Oral Movements Muscles of Facial Expression: None, normal Lips and Perioral Area: None, normal Jaw: None, normal Tongue: None, normal,Extremity Movements Upper (arms, wrists, hands, fingers): None, normal Lower (legs, knees, ankles, toes): None, normal, Trunk Movements Neck, shoulders, hips: None, normal, Overall Severity Severity of abnormal movements (highest score from questions above): None, normal Incapacitation due to abnormal movements: None, normal Patient's awareness of abnormal movements (rate only patient's report): No Awareness, Dental Status Current problems with teeth and/or dentures?: No Does patient usually wear dentures?: No  CIWA:    COWS:     Musculoskeletal: Strength & Muscle Tone: within normal limits Gait & Station: normal Patient leans: N/A  Psychiatric Specialty Exam: Physical Exam  Nursing note and vitals reviewed.   Review of Systems  Constitutional: Negative for chills and fever.  Respiratory: Negative for cough and shortness of breath.   Cardiovascular: Negative for chest pain.  Gastrointestinal: Negative for abdominal pain, heartburn, nausea and vomiting.  Psychiatric/Behavioral: Negative for depression, hallucinations and suicidal ideas. The patient is not nervous/anxious and does not have insomnia.     Blood pressure 107/63, pulse 90, temperature 98.6 F (37 C), temperature source Oral, resp. rate 16, height 5\' 2"  (1.575 m), weight 53.1 kg (117 lb), SpO2 98 %.Body mass index is 21.4 kg/m.  General Appearance: Casual and Disheveled  Eye Contact:   Minimal  Speech:  Blocked  Volume:  Decreased  Mood:  Depressed  Affect:  Flat  Thought Process:  Disorganized  Orientation:  Other:  unable to respond to orientation questions  Thought Content:  thought blocking and internally preoccupied  Suicidal Thoughts:  No  Homicidal Thoughts:  No  Memory:  unable to assess  Judgement:  Impaired  Insight:  Lacking  Psychomotor Activity:  Decreased  Concentration:  Concentration: Fair  Recall:  unable to assess  Fund of Knowledge:  Fair  Language:  unable to assess  Akathisia:  No  Handed:    AIMS (if indicated):     Assets:  Resilience  ADL's:  Impaired  Cognition:  WNL  Sleep:  Number of Hours: 6.75   Treatment Plan Summary: Daily contact with patient to assess and evaluate symptoms and progress in treatment and Medication management   -Continue inpatient hospitalization  - MDD with psychotic features - Continue lexapro10mg  po qDay - Continue risperdal 1mg  po qAM + 2mg  po qhs  - Catatonic features - Restart ativan 0.5mg  po TID  - Poor nutritional intake - Continue Ensure BID between meals  - Insomnia -Continuetrazodone 100mg  po qhs prn insomnia (may repeat x1)  -Encourage participation in groups and therapeutic milieu  -Disposition planning will be ongoing  Micheal Likenshristopher T Mary Hockey, MD 06/27/2017, 12:20 PM

## 2017-06-27 NOTE — Progress Notes (Signed)
Patient ID: Garrett HarrisHtee Simmer, male   DOB: 10-19-1985, 32 y.o.   MRN: [id number]  D: Patient was reclusive to his room through entire shift, stayed in his room for breakfast and lunch, was encouraged to eat, and ate minimal amounts of his breakfast and lunch and went to the cafeteria for dinner and ate all of his dinner.  This RN was unable to complete assessment regarding orientation to person/place/time, and to determine if he is hearing voices or not, as pt remained selectively mute, even when language interpreter attempted to assist with interpreting.   Affect is blunted, mood is depressed, and pt seems guarded and seems to be responding to internal stimuli.  A: Pt was maintained on Q15 minute safety checks and given all of his medications as scheduled.  R: Q15 minute safety checks maintained, will continue to monitor.

## 2017-06-27 NOTE — Progress Notes (Signed)
Recreation Therapy Notes  Date: 3.27.19 Time: 10:00 p.m. Location: [address] Day[address]   Group Topic: Self-Esteem   Goal Area(s) Addresses:  Goal 1.1: To increase self-esteem  - Group will improve mood through participation during Recreation Therapy tx.  - Group will identify at least one positive affirmation by the end of therapy session.   - Group will identify the importance of self-esteem     Intervention: Art   Activity: Patients were to think of a positive affirmation phrase or word to write on their poster. Patents were then given the opportunity to design their poster using the colored pencils and markers provided. Afterwards, Patients shared their positive affirmation to the group and explained how it can help improve their self-esteem.   Education: Self-Esteem   Education Outcome: Acknowledges Education  Clinical Observations/Feedback: Patient did not attend   Sheryle HailDarian Burwell Bethel, Recreation Therapy Intern  Sheryle HailDarian Doaa Kendzierski 06/27/2017 10:48 AM

## 2017-06-27 NOTE — [facility] Group Notes (Signed)
LCSW Group Therapy Note   06/27/2017 1:15pm   Type of Therapy and Topic:  Group Therapy:  Trust and Honesty  Participation Level:  Did Not Attend  Description of Group:    In this group patients will be asked to explore the value of being honest.  Patients will be guided to discuss their thoughts, feelings, and behaviors related to honesty and trusting in others. Patients will process together how trust and honesty relate to forming relationships with peers, family members, and self. Each patient will be challenged to identify and express feelings of being vulnerable. Patients will discuss reasons why people are dishonest and identify alternative outcomes if one was truthful (to self or others). This group will be process-oriented, with patients participating in exploration of their own experiences, giving and receiving support, and processing challenge from other group members.   Therapeutic Goals: 1. Patient will identify why honesty is important to relationships and how honesty overall affects relationships.  2. Patient will identify a situation where they lied or were lied too and the  feelings, thought process, and behaviors surrounding the situation 3. Patient will identify the meaning of being vulnerable, how that feels, and how that correlates to being honest with self and others. 4. Patient will identify situations where they could have told the truth, but instead lied and explain reasons of dishonesty.   Summary of Patient Progress    Therapeutic Modalities:   Cognitive Behavioral Therapy Solution Focused Therapy Motivational Interviewing Brief Therapy  Garrett [name] B [name], LCSW 06/27/2017 10:02 AM

## 2017-06-27 NOTE — Progress Notes (Signed)
Psychoeducational Group Note  Date:  06/27/2017 Time:  2050  Group Topic/Focus:  Wrap-Up Group:   The focus of this group is to help patients review their daily goal of treatment and discuss progress on daily workbooks.  Participation Level: Did Not Attend  Participation Quality:  Not Applicable  Affect:  Not Applicable  Cognitive:  Not Applicable  Insight:  Not Applicable  Engagement in Group: Not Applicable  Additional Comments: The patient did not attend group this evening since he elected to remain in his room.   Hazle CocaGOODMAN, Demaris Bousquet S 06/27/2017, 8:50 PM

## 2017-06-27 NOTE — Tx Team (Signed)
Interdisciplinary Treatment and Diagnostic Plan Update  06/27/2017 Time of Session: 9:02 AM  Quade Ramirez MRN: [id number]  Principal Diagnosis: MDD (major depressive disorder), recurrent, severe, with psychosis ([place])  Secondary Diagnoses: Principal Problem:   MDD (major depressive disorder), recurrent, severe, with psychosis ([place])   Current Medications:  Current Facility-Administered Medications  Medication Dose Route Frequency Provider Last Rate Last Dose  . acetaminophen (TYLENOL) tablet 650 mg  650 mg Oral Q6H PRN Patrecia Pour, NP   650 mg at 06/22/17 1427  . alum & mag hydroxide-simeth (MAALOX/MYLANTA) 200-200-20 MG/5ML suspension 30 mL  30 mL Oral Q4H PRN Patrecia Pour, NP      . escitalopram (LEXAPRO) tablet 10 mg  10 mg Oral Daily Derrill Center, NP   10 mg at 06/27/17 0813  . feeding supplement (ENSURE ENLIVE) (ENSURE ENLIVE) liquid 237 mL  237 mL Oral BID BM Pennelope Bracken, MD   237 mL at 06/26/17 1043  . magnesium hydroxide (MILK OF MAGNESIA) suspension 30 mL  30 mL Oral Daily PRN Patrecia Pour, NP      . multivitamin with minerals tablet 1 tablet  1 tablet Oral Daily Pennelope Bracken, MD   1 tablet at 06/27/17 0815  . risperiDONE (RISPERDAL) tablet 1 mg  1 mg Oral Rockne Menghini, MD   1 mg at 06/27/17 0815   And  . risperiDONE (RISPERDAL) tablet 2 mg  2 mg Oral QHS Pennelope Bracken, MD   2 mg at 06/25/17 2149  . traZODone (DESYREL) tablet 100 mg  100 mg Oral QHS PRN,MR X 1 Lindon Romp A, NP   100 mg at 06/25/17 2149    PTA Medications: No medications prior to admission.    Patient Stressors: Occupational concerns  Patient Strengths: Capable of independent living Motivation for treatment/growth Supportive family/friends  Treatment Modalities: Medication Management, Group therapy, Case management,  1 to 1 session with clinician, Psychoeducation, Recreational therapy.   Physician Treatment Plan for Primary Diagnosis: MDD  (major depressive disorder), recurrent, severe, with psychosis ([place]) Long Term Goal(s): Improvement in symptoms so as ready for discharge  Short Term Goals: Ability to identify changes in lifestyle to reduce recurrence of condition will improve Ability to identify and develop effective coping behaviors will improve  Medication Management: Evaluate patient's response, side effects, and tolerance of medication regimen.  Therapeutic Interventions: 1 to 1 sessions, Unit Group sessions and Medication administration.  Evaluation of Outcomes: Progressing  Physician Treatment Plan for Secondary Diagnosis: Principal Problem:   MDD (major depressive disorder), recurrent, severe, with psychosis ([place])   Long Term Goal(s): Improvement in symptoms so as ready for discharge  Short Term Goals: Ability to identify changes in lifestyle to reduce recurrence of condition will improve Ability to identify and develop effective coping behaviors will improve  Medication Management: Evaluate patient's response, side effects, and tolerance of medication regimen.  Therapeutic Interventions: 1 to 1 sessions, Unit Group sessions and Medication administration.  Evaluation of Outcomes: Progressing   3/27: Pt was encouraged to improve his oral intake of food and water, and he nodded in understanding. Discussed with patient that we will restart previous medication of ativan to address his worsening catatonic features   - MDD with psychotic features - Continue lexapro37m po qDay - Continuerisperdal 161mpo qAM + 11m66mo qhs  - Catatonic features - Restartativan 0.5mg68m TID    RN Treatment Plan for Primary Diagnosis: MDD (major depressive disorder), recurrent, severe, with psychosis (HCC)Defianceng  Term Goal(s): Knowledge of disease and therapeutic regimen to maintain health will improve  Short Term Goals: Ability to identify and develop effective coping behaviors will  improve and Compliance with prescribed medications will improve  Medication Management: RN will administer medications as ordered by provider, will assess and evaluate patient's response and provide education to patient for prescribed medication. RN will report any adverse and/or side effects to prescribing provider.  Therapeutic Interventions: 1 on 1 counseling sessions, Psychoeducation, Medication administration, Evaluate responses to treatment, Monitor vital signs and CBGs as ordered, Perform/monitor CIWA, COWS, AIMS and Fall Risk screenings as ordered, Perform wound care treatments as ordered.  Evaluation of Outcomes: Progressing   LCSW Treatment Plan for Primary Diagnosis: MDD (major depressive disorder), recurrent, severe, with psychosis ([place]) Long Term Goal(s): Safe transition to appropriate next level of care at discharge, Engage patient in therapeutic group addressing interpersonal concerns.  Short Term Goals: Engage patient in aftercare planning with referrals and resources  Therapeutic Interventions: Assess for all discharge needs, 1 to 1 time with Social worker, Explore available resources and support systems, Assess for adequacy in community support network, Educate family and significant other(s) on suicide prevention, Complete Psychosocial Assessment, Interpersonal group therapy.  Evaluation of Outcomes: Met  Return home, follow up outp at [facility].   Progress in Treatment: Attending groups: Yes Participating in groups:Minimally Taking medication as prescribed: Yes Toleration medication: Yes, no side effects reported at this time Family/Significant other contact made: Yes-pastor Patient understands diagnosis: No  Limited insight Discussing patient identified problems/goals with staff: Yes Medical problems stabilized or resolved: Yes Denies suicidal/homicidal ideation: Yes Issues/concerns per patient self-inventory: None Other: N/A  New problem(s) identified: None  identified at this time.   New Short Term/Long Term Goal(s): "I want to get better, and take care of my health."  Discharge Plan or Barriers:   Reason for Continuation of Hospitalization: Depression Psychosis Medication stabilization Catatonia  Estimated Length of Stay: 4/1  Attendees: Patient:  06/27/2017  9:02 AM  Physician: Maris Berger, MD 06/27/2017  9:02 AM  Nursing: Elesa Massed RN 06/27/2017  9:02 AM  RN Care Manager: Lars Pinks, RN 06/27/2017  9:02 AM  Social Worker: Ripley Fraise 06/27/2017  9:02 AM  Recreational Therapist: Winfield Cunas 06/27/2017  9:02 AM  Other: Norberto Sorenson 06/27/2017  9:02 AM  Other:  06/27/2017  9:02 AM    Scribe for Treatment Team:  Roque Lias LCSW 06/27/2017 9:02 AM

## 2017-06-27 NOTE — Plan of Care (Signed)
  Problem: Self-Concept: Goal: Ability to disclose and discuss suicidal ideas will improve Outcome: Progressing Note:  Pt denies SI at this time

## 2017-06-28 NOTE — Progress Notes (Signed)
Recreation Therapy Notes  Date: 3.28.19 Time: 10:00 a.m. Location: [address] Day[address]  Group Topic: Wellness   Goal Area(s) Addresses:  To promote physical health  -Patient will participate in recreation Therapy group tx. -Patient will identify the importance of physical  -Patient will instruct at least two dances/ exercise moves   Intervention: Exercise   Activity: Zumba: Patients contributed at least two freestyle dances/exercise moves towards group activity to increase physical activity    Education: Wellness   Education Outcome: Acknowledges Education  Clinical Observations/Feedback: Patient did not attend   Sheryle HailDarian Analeese Andreatta, Recreation Therapy Intern   Sheryle HailDarian Estill Llerena 06/28/2017 9:23 AM

## 2017-06-28 NOTE — Progress Notes (Signed)
[facility] MD Progress Note  06/28/2017 11:14 AM Garrett [name]  MRN:  [id number] Subjective:    Garrett [name] (Prounounced "Tee See") is a 32 y/o M originally from [place] (primary language is [name]) with previous psychiatric history of MDD with psychotic features who presented as a voluntary walk in to [facility] accompanied by his pastor with worsening symptoms of psychosis and disorganization. Pt was responding to internal stimuli and he reported increased depression and anxiety in context of losing his job on 05/24/17. In ED pt was guarded with thought blocking, but he was agreeable to inpatient hospitalization. He was started on low dose of risperdal and transferred to [facility], and he dose was titrated up during his stay.Pt was started on scheduled ativan due to symptoms of catatonia and poor intake, and he had some improvement of his presenting symptoms.RN staff have noted that pt has been participating in groups (to the best of his ability) and his oral intake of food/hydration has improved during his stay. Ativan was gradually reduced and then discontinued; however, pt had worsening of catatonic features so yesterday ativan was restarted.    Today upon interview, pt was evaluated with assistance of [name] Interpreter ([name]). Pt appears brighter, more alert, and he has decreased latency of his response as compared to yesterday's interview. He denies any specific concerns. He denies physical complaints. He is sleeping well. His appetite and oral intake have been good. He endorses AH which are minimal of "mumbling voices" but they are not bothersome or intrusive. He denies SI/HI/VH. Pt is in agreement to continue his current regimen without changes.  Principal Problem: MDD (major depressive disorder), recurrent, severe, with psychosis (HCC) Diagnosis:   Patient Active Problem List   Diagnosis Date Noted  . Major depressive disorder, recurrent severe without psychotic features (HCC) [F33.2] 06/20/2017  . MDD (major  depressive disorder), recurrent, severe, with psychosis (HCC) [F33.3] 06/19/2017   Total Time spent with patient: 30 minutes  Past Psychiatric History: see H&P  Past Medical History:  Past Medical History:  Diagnosis Date  . Depression    History reviewed. No pertinent surgical history. Family History: History reviewed. No pertinent family history. Family Psychiatric  History: see H&P Social History:  Social History   Substance and Sexual Activity  Alcohol Use Not on file     Social History   Substance and Sexual Activity  Drug Use Not on file    Social History   Socioeconomic History  . Marital status: Single    Spouse name: Not on file  . Number of children: Not on file  . Years of education: Not on file  . Highest education level: Not on file  Occupational History  . Not on file  Social Needs  . Financial resource strain: Not on file  . Food insecurity:    Worry: Not on file    Inability: Not on file  . Transportation needs:    Medical: Not on file    Non-medical: Not on file  Tobacco Use  . Smoking status: Never Smoker  . Smokeless tobacco: Never Used  Substance and Sexual Activity  . Alcohol use: Not on file  . Drug use: Not on file  . Sexual activity: Not on file  Lifestyle  . Physical activity:    Days per week: Not on file    Minutes per session: Not on file  . Stress: Not on file  Relationships  . Social connections:    Talks on phone: Not on file  Gets together: Not on file    Attends religious service: Not on file    Active member of club or organization: Not on file    Attends meetings of clubs or organizations: Not on file    Relationship status: Not on file  Other Topics Concern  . Not on file  Social History Narrative  . Not on file   Additional Social History:                         Sleep: Good  Appetite:  Good  Current Medications: Current Facility-Administered Medications  Medication Dose Route Frequency  Provider Last Rate Last Dose  . acetaminophen (TYLENOL) tablet 650 mg  650 mg Oral Q6H PRN Charm Rings, NP   650 mg at 06/22/17 1427  . alum & mag hydroxide-simeth (MAALOX/MYLANTA) 200-200-20 MG/5ML suspension 30 mL  30 mL Oral Q4H PRN Charm Rings, NP      . escitalopram (LEXAPRO) tablet 10 mg  10 mg Oral Daily Oneta Rack, NP   10 mg at 06/28/17 0806  . feeding supplement (ENSURE ENLIVE) (ENSURE ENLIVE) liquid 237 mL  237 mL Oral BID BM Micheal Likens, MD   237 mL at 06/28/17 0808  . LORazepam (ATIVAN) tablet 0.5 mg  0.5 mg Oral TID Micheal Likens, MD   0.5 mg at 06/28/17 0806  . magnesium hydroxide (MILK OF MAGNESIA) suspension 30 mL  30 mL Oral Daily PRN Charm Rings, NP      . multivitamin with minerals tablet 1 tablet  1 tablet Oral Daily Micheal Likens, MD   1 tablet at 06/28/17 0806  . risperiDONE (RISPERDAL) tablet 1 mg  1 mg Oral Veatrice Kells, MD   1 mg at 06/28/17 8657   And  . risperiDONE (RISPERDAL) tablet 2 mg  2 mg Oral QHS Micheal Likens, MD   2 mg at 06/27/17 2131  . traZODone (DESYREL) tablet 100 mg  100 mg Oral QHS PRN,MR X 1 Nira Conn A, NP   100 mg at 06/25/17 2149    Lab Results: No results found for this or any previous visit (from the past 48 hour(s)).  Blood Alcohol level:  Lab Results  Component Value Date   ETH <10 06/18/2017   ETH  12/11/2008    <5        LOWEST DETECTABLE LIMIT FOR SERUM ALCOHOL IS 5 mg/dL FOR MEDICAL PURPOSES ONLY    Metabolic Disorder Labs: Lab Results  Component Value Date   HGBA1C 5.0 06/22/2017   MPG 96.8 06/22/2017   Lab Results  Component Value Date   PROLACTIN 32.9 (H) 06/22/2017   Lab Results  Component Value Date   CHOL 199 06/22/2017   TRIG 107 06/22/2017   HDL 43 06/22/2017   CHOLHDL 4.6 06/22/2017   VLDL 21 06/22/2017   LDLCALC 135 (H) 06/22/2017    Physical Findings: AIMS: Facial and Oral Movements Muscles of Facial Expression: None,  normal Lips and Perioral Area: None, normal Jaw: None, normal Tongue: None, normal,Extremity Movements Upper (arms, wrists, hands, fingers): None, normal Lower (legs, knees, ankles, toes): None, normal, Trunk Movements Neck, shoulders, hips: None, normal, Overall Severity Severity of abnormal movements (highest score from questions above): None, normal Incapacitation due to abnormal movements: None, normal Patient's awareness of abnormal movements (rate only patient's report): No Awareness, Dental Status Current problems with teeth and/or dentures?: No Does patient usually wear dentures?: No  CIWA:    COWS:     Musculoskeletal: Strength & Muscle Tone: within normal limits Gait & Station: normal Patient leans: N/A  Psychiatric Specialty Exam: Physical Exam  Nursing note and vitals reviewed.   Review of Systems  Constitutional: Negative for chills and fever.  Respiratory: Negative for cough and shortness of breath.   Cardiovascular: Negative for chest pain.  Gastrointestinal: Negative for heartburn and nausea.  Psychiatric/Behavioral: Negative for depression, hallucinations and suicidal ideas. The patient is not nervous/anxious and does not have insomnia.     Blood pressure 107/63, pulse 90, temperature 98.6 F (37 C), temperature source Oral, resp. rate 16, height 5\' 2"  (1.575 m), weight 53.1 kg (117 lb), SpO2 98 %.Body mass index is 21.4 kg/m.  General Appearance: Casual  Eye Contact:  Good  Speech:  Clear and Coherent and Normal Rate  Volume:  Normal  Mood:  Euthymic  Affect:  Flat  Thought Process:  Coherent and Goal Directed  Orientation:  Full (Time, Place, and Person)  Thought Content:  Logical  Suicidal Thoughts:  No  Homicidal Thoughts:  No  Memory:  Immediate;   Fair Recent;   Fair Remote;   Fair  Judgement:  Fair  Insight:  Fair  Psychomotor Activity:  Normal  Concentration:  Concentration: Fair  Recall:  Good  Fund of Knowledge:  Fair  Language:  Fair   Akathisia:  No  Handed:    AIMS (if indicated):     Assets:  Communication Skills Resilience Social Support  ADL's:  Intact  Cognition:  WNL  Sleep:  Number of Hours: 5.5   Treatment Plan Summary: Daily contact with patient to assess and evaluate symptoms and progress in treatment and Medication management   -Continue inpatient hospitalization  - MDD with psychotic features - Continue lexapro10mg  po qDay - Continuerisperdal 1mg  po qAM + 2mg  po qhs  - Catatonic features - Continueativan 0.5mg  po TID  - Poor nutritional intake - Continue Ensure BID between meals  - Insomnia -Continuetrazodone 100mg  po qhs prn insomnia (may repeat x1)  -Encourage participation in groups and therapeutic milieu  -Disposition planning will be ongoing    Micheal Likenshristopher T Shunte Senseney, MD 06/28/2017, 11:14 AM

## 2017-06-28 NOTE — Progress Notes (Signed)
Adult Psychoeducational Group Note  Date:  06/28/2017 Time:  8:42 PM  Group Topic/Focus:  Wrap-Up Group:   The focus of this group is to help patients review their daily goal of treatment and discuss progress on daily workbooks.  Participation Level:  Active  Participation Quality:  Appropriate  Affect:  Appropriate  Cognitive:  Alert  Insight: Appropriate  Engagement in Group:  Engaged  Modes of Intervention:  Discussion  Additional Comments:  Patient stated having a good day. Patient's goal for today was to eat, stay hydrated, and rest.   Kingstyn Deruiter L Zakai Gonyea 06/28/2017, 8:42 PM

## 2017-06-28 NOTE — [facility] Group Notes (Signed)
LCSW Group Therapy 06/28/2017 1:15pm  Type of Therapy and Topic:  Group Therapy:  Change and Accountability  Participation Level:  Did Not Attend  Description of Group In this group, patients discussed power and accountability for change.  The group identified the challenges related to accountability and the difficulty of accepting the outcomes of negative behaviors.  Patients were encouraged to openly discuss a challenge/change they could take responsibility for.  Patients discussed the use of "change talk" and positive thinking as ways to support achievement of personal goals.  The group discussed ways to give support and empowerment to peers.  Therapeutic Goals: 1. Patients will state the relationship between personal power and accountability in the change process 2. Patients will identify the positive and negative consequences of a personal choice they have made 3. Patients will identify one challenge/choice they will take responsibility for making 4. Patients will discuss the role of "change talk" and the impact of positive thinking as it supports successful personal change 5. Patients will verbalize support and affirmation of change efforts in peers  Summary of Patient Progress:    Therapeutic Modalities Solution Focused Brief Therapy Motivational Interviewing Cognitive Behavioral Therapy  Garrett [name], Student-Social Work 06/28/2017 11:11 AM

## 2017-06-28 NOTE — Progress Notes (Signed)
Patient ID: Garrett [name], male   DOB: [birth year], 32 y.o.   MRN: [id number]  D: Patient with blunted affect, seemed preoccupied, did not answer questions regarding auditory/visual hallucinations, but seemed guarded as if responding to some kind of internal stimuli.  Language line interpreter currently assisting staff with translations.  Pt is disheveled and malodorous, and has been given positive reinforcements to take a shower and continues to refuse to take showers.  Patient ate minimal amounts of his breakfast, but was given an Ensure and drank a bottle. RN unable to assess suicidal thoughts, and orientation to person, place and time as patient is selectively mute when questions are asked. Pain assessed via the Faces scale, and pt does not seem to be in any pain/distress.  A: Patient given all of his medications as scheduled, given his self inventory form to complete, but refused to complete it.  Pt maintained on Q15 minute checks for safety.  R: Pt remains safe on the unit, no signs/symptoms of distress noted, will continue to monitor.

## 2017-06-29 MED ORDER — LORAZEPAM 0.5 MG PO TABS
0.5000 mg | ORAL_TABLET | Freq: Two times a day (BID) | ORAL | Status: DC
  Administered 2017-06-29 – 2017-07-05 (×13): 0.5 mg via ORAL
  Filled 2017-06-29 (×13): qty 1

## 2017-06-29 MED ORDER — RISPERIDONE 2 MG PO TABS
2.0000 mg | ORAL_TABLET | Freq: Two times a day (BID) | ORAL | Status: DC
  Administered 2017-06-29 – 2017-07-05 (×13): 2 mg via ORAL
  Filled 2017-06-29 (×16): qty 1

## 2017-06-29 NOTE — Progress Notes (Signed)
[facility]  06/29/2017 11:21 AM Garrett HarrisHtee Kos  MRN:  [id number] Subjective:    Garrett [name] (Prounounced "Tee See") is a 32 y/o M originally from [place] (primary language is Clydie BraunKaren) with previous psychiatric history of MDD with psychotic features who presented as a voluntary walk in to [facility] accompanied by his pastor with worsening symptoms of psychosis and disorganization. Pt was responding to internal stimuli and he reported increased depression and anxiety in context of losing his job on 05/24/17. In ED pt was guarded with thought blocking, but he was agreeable to inpatient hospitalization. He was started on low dose of risperdal and [facility], and he dose was titrated up during his stay.Pt was started on scheduled ativan due to symptoms of catatonia and poor intake, and he had some improvement of his presenting symptoms.RN staff have noted that pt has been participating in groups (to the best of his ability) and his oral intake of food/hydration has improved during his stay. Ativan wasgraduallyreduced and then discontinued; however, pt had worsening of catatonic features so ativan was restarted and pt had improvement of his catatonic symptoms.  Today upon interview, pt was evaluated with assistance of Clydie BraunKaren Interpreter ([name]). Pt was asked if he has any concerns today, and he replies, "No problems." He denies any physical complaints aside from some mild back pain for which he is aware that PRN acetaminophen is available (however, pt has not utilized this available medication, and he was encouraged to ask the nurses for it, if needed). He reports that he is sleeping well. He notes some mild daytime fatigue, and we discussed how ativan has been helpful for his other symptoms but may be causing him to feel slightly drowsy during the day. Discussed with patient about reducing dose of ativan to twice daily instead of three times per day, and pt was in agreement. Pt denies SI/HI/AH/VH. He  had been reporting AH and VH in the past few days; however, he is unsure when last AH or VH occurred when asked today. Discussed with patient about increasing dose of risperdal, and he was in agreement. Pt was in agreement with the above plan, and he had no further questions, comments, or concerns.  Principal Problem: MDD (major depressive disorder), recurrent, severe, with psychosis (HCC) Diagnosis:   Patient Active Problem List   Diagnosis Date Noted  . Major depressive disorder, recurrent severe without psychotic features (HCC) [F33.2] 06/20/2017  . MDD (major depressive disorder), recurrent, severe, with psychosis (HCC) [F33.3] 06/19/2017   Total Time spent with patient: 30 minutes  Past Psychiatric History: see H&P  Past Medical History:  Past Medical History:  Diagnosis Date  . Depression    History reviewed. No pertinent surgical history. Family History: History reviewed. No pertinent family history. Family Psychiatric  History: see H&P Social History:  Social History   Substance and Sexual Activity  Alcohol Use Not on file     Social History   Substance and Sexual Activity  Drug Use Not on file    Social History   Socioeconomic History  . Marital status: Single    Spouse name: Not on file  . Number of children: Not on file  . Years of education: Not on file  . Highest education level: Not on file  Occupational History  . Not on file  Social Needs  . Financial resource strain: Not on file  . Food insecurity:    Worry: Not on file    Inability: Not  on file  . Transportation needs:    Medical: Not on file    Non-medical: Not on file  Tobacco Use  . Smoking status: Never Smoker  . Smokeless tobacco: Never Used  Substance and Sexual Activity  . Alcohol use: Not on file  . Drug use: Not on file  . Sexual activity: Not on file  Lifestyle  . Physical activity:    Days per week: Not on file    Minutes per session: Not on file  . Stress: Not on file   Relationships  . Social connections:    Talks on phone: Not on file    Gets together: Not on file    Attends religious service: Not on file    Active member of club or organization: Not on file    Attends meetings of clubs or organizations: Not on file    Relationship status: Not on file  Other Topics Concern  . Not on file  Social History Narrative  . Not on file   Additional Social History:                         Sleep: Good  Appetite:  Good  Current Medications: Current Facility-Administered Medications  Medication Dose Route Frequency Provider Last Rate Last Dose  . acetaminophen (TYLENOL) tablet 650 mg  650 mg Oral Q6H PRN Charm Rings, NP   650 mg at 06/22/17 1427  . alum & mag hydroxide-simeth (MAALOX/MYLANTA) 200-200-20 MG/5ML suspension 30 mL  30 mL Oral Q4H PRN Charm Rings, NP      . escitalopram (LEXAPRO) tablet 10 mg  10 mg Oral Daily Oneta Rack, NP   10 mg at 06/29/17 0817  . feeding supplement (ENSURE ENLIVE) (ENSURE ENLIVE) liquid 237 mL  237 mL Oral BID BM Micheal Likens, MD   237 mL at 06/29/17 1043  . LORazepam (ATIVAN) tablet 0.5 mg  0.5 mg Oral TID Micheal Likens, MD   0.5 mg at 06/29/17 0816  . magnesium hydroxide (MILK OF MAGNESIA) suspension 30 mL  30 mL Oral Daily PRN Charm Rings, NP      . multivitamin with minerals tablet 1 tablet  1 tablet Oral Daily Micheal Likens, MD   1 tablet at 06/29/17 0817  . risperiDONE (RISPERDAL) tablet 2 mg  2 mg Oral BID Micheal Likens, MD      . traZODone (DESYREL) tablet 100 mg  100 mg Oral QHS PRN,MR X 1 Nira Conn A, NP   100 mg at 06/28/17 2218    Lab Results: No results found for this or any previous visit (from the past 48 hour(s)).  Blood Alcohol level:  Lab Results  Component Value Date   ETH <10 06/18/2017   ETH  12/11/2008    <5        LOWEST DETECTABLE LIMIT FOR SERUM ALCOHOL IS 5 mg/dL FOR MEDICAL PURPOSES ONLY    Metabolic Disorder  Labs: Lab Results  Component Value Date   HGBA1C 5.0 06/22/2017   MPG 96.8 06/22/2017   Lab Results  Component Value Date   PROLACTIN 32.9 (H) 06/22/2017   Lab Results  Component Value Date   CHOL 199 06/22/2017   TRIG 107 06/22/2017   HDL 43 06/22/2017   CHOLHDL 4.6 06/22/2017   VLDL 21 06/22/2017   LDLCALC 135 (H) 06/22/2017    Physical Findings: AIMS: Facial and Oral Movements Muscles of Facial Expression: None, normal Lips  and Perioral Area: None, normal Jaw: None, normal Tongue: None, normal,Extremity Movements Upper (arms, wrists, hands, fingers): None, normal Lower (legs, knees, ankles, toes): None, normal, Trunk Movements Neck, shoulders, hips: None, normal, Overall Severity Severity of abnormal movements (highest score from questions above): None, normal Incapacitation due to abnormal movements: None, normal Patient's awareness of abnormal movements (rate only patient's report): No Awareness, Dental Status Current problems with teeth and/or dentures?: No Does patient usually wear dentures?: No  CIWA:    COWS:     Musculoskeletal: Strength & Muscle Tone: within normal limits Gait & Station: normal Patient leans: N/A  Psychiatric Specialty Exam: Physical Exam  Nursing note and vitals reviewed.   Review of Systems  Constitutional: Negative for chills and fever.  Gastrointestinal: Negative for abdominal pain, heartburn, nausea and vomiting.  Musculoskeletal: Positive for back pain.    Blood pressure 95/61, pulse (!) 107, temperature 97.7 F (36.5 C), temperature source Oral, resp. rate 16, height 5\' 2"  (1.575 m), weight 53.1 kg (117 lb), SpO2 98 %.Body mass index is 21.4 kg/m.  General Appearance: Casual and Fairly Groomed  Eye Contact:  Good  Speech:  Clear and Coherent and Normal Rate (as per interpreter)  Volume:  Normal  Mood:  Euthymic  Affect:  Congruent and Flat  Thought Process:  Coherent and Goal Directed  Orientation:  Full (Time, Place, and  Person)  Thought Content:  Logical  Suicidal Thoughts:  No  Homicidal Thoughts:  No  Memory:  Immediate;   Fair Recent;   Fair Remote;   Fair  Judgement:  Fair  Insight:  Fair  Psychomotor Activity:  Normal  Concentration:  Concentration: Fair  Recall:  [employer] of Knowledge:  Fair  Language:  Fair  Akathisia:  No  Handed:    AIMS (if indicated):     Assets:  Communication Skills Resilience Social Support  ADL's:  Intact  Cognition:  WNL  Sleep:  Number of Hours: 6.5   Treatment Plan Summary: Daily contact with patient to assess and evaluate symptoms and progress in treatment and Medication management   -Continue inpatient hospitalization  - MDD with psychotic features - Continue lexapro10mg  po qDay - Changerisperdal 1mg  po qAM + 2mg  po qhs to Risperdal 2mg  po BID  - Catatonic features - Changeativan 0.5mg  po TID to ativan 0.5mg  po BID  - Poor nutritional intake - Continue Ensure BID between meals  - Insomnia -Continuetrazodone 100mg  po qhs prn insomnia (may repeat x1)  -Encourage participation in groups and therapeutic milieu  -Disposition planning will be ongoing  Micheal Likens, MD 06/29/2017, 11:21 AM

## 2017-06-29 NOTE — Progress Notes (Signed)
D:  Garrett [name] has been in his room much of the morning.  Minimal interaction with staff and peers.  Interpreter is present.  Poor eye contact.   He denies SI/HI or A/V hallucinations.  He denies any pain or discomfort and appears to be in no physical distress.  He has been going to the cafeteria for meals and is eating well.  He is accepting the ensures and medications without difficulty.  He voiced no questions. He is withdrawn and talks minimally with his interpreter.  He didn't attend groups this morning but is up and went outside for recreation time.  He didn't complete is self inventory even after much encouragement. A:  1:1 offered for support and encouragement.  Medications as ordered.  Q 15 minute checks maintained for safety.  Encouraged participation in group and unit activities.   D:  Garrett [name] remains safe on the unit.  We will continue to monitor the progress towards his goals.

## 2017-06-29 NOTE — Progress Notes (Signed)
Recreation Therapy Notes  Date: 06/29/17 Time: 1000 Location: 500 Hall Dayroom  Group Topic: Leisure Education  Goal Area(s) Addresses:  Patient will identify positive leisure activities.  Patient will identify one positive benefit of participation in leisure activities.   Intervention: White board, marker, eraser, names of leisure activities  Activity: Leisure Pictionary.  Each patient was had to pick a leisure activity from the container and draw it on the board.  The remaining group members were to guess what was being drawn.  The person that guessed the picture would get the next turn.  Education:  Leisure Education, Discharge Planning  Education Outcome: Acknowledges education/In group clarification offered/Needs additional education  Clinical Observations/Feedback: Pt did not attend group.    Chastidy Ranker, LRT/CTRS         Janaiyah Blackard A 06/29/2017 11:03 AM 

## 2017-06-29 NOTE — Progress Notes (Signed)
Writer used an Engineer, technical salesinterpretor to help with the assessment. However, writer wanted to understand how much [place] pt understood without the aid of the interpretor. Pt was able to understand and answer "pain question". However, he didn't appear to understand more than that.   Rest of assessment performed with interpretor.  When asked about his day the pt stated,"it was good". When asked about seeing things that he know aren't there pt stated, "I see stuff today". Explained that he saw both men and women outside his window walking down the sidewalk. Writer asked if it was real pt stated, "yes". Writer asked if pt saw anything that he "know wasn't real", pt stated, "no". Pt denied all hallucinations.    After the assessment writer spoke to the interpretor, who informed writer that pt had said things to her earlier today that "didn't make since". Pt informed her that he's been in [facility] before. It was determined that pt was admitted in the past, however, pt went on to say that he was in here as a little boy. Pt has no questions or concerns.    A:  Support and encouragement was offered. 15 min checks continued for safety.  R: Pt remains safe.

## 2017-06-29 NOTE — Plan of Care (Signed)
Minimal participation in group and unit activities.  He did choose to go outside for recreation time.  We will continue to participate in group and unit activities

## 2017-06-29 NOTE — [facility] Group Notes (Signed)
[facility] LCSW Group Therapy  06/29/2017  1:05 PM  Type of Therapy:  Group therapy  Participation Level:  Invited.  Chose to not attend.  Participation Quality:  Attentive  Affect:  Flat  Cognitive:  Oriented  Insight:  Limited  Engagement in Therapy:  Limited  Modes of Intervention:  Discussion, Socialization  Summary of Progress/Problems:  Chaplain was here to lead a group on themes of hope and courage.  Ida Rogueorth, Javan Gonzaga B 06/29/2017

## 2017-06-29 NOTE — Progress Notes (Signed)
Adult Psychoeducational Group Note  Date:  06/29/2017 Time:  8:29 PM  Group Topic/Focus:  Wrap-Up Group:   The focus of this group is to help patients review their daily goal of treatment and discuss progress on daily workbooks.  Participation Level:  Active  Participation Quality:  Appropriate  Affect:  Appropriate  Cognitive:  Appropriate  Insight: Appropriate  Engagement in Group:  Engaged  Modes of Intervention:  Discussion  Additional Comments: The patient expressed that he rates today a 8.The patient also said that he did not attend groups.  [name], Ansel Ferrall Lee 06/29/2017, 8:29 PM

## 2017-06-29 NOTE — Progress Notes (Signed)
  Assessment was conducted using an interpretor.  D: When asked about his day the pt stated, "good". Initially when asked if he had any questions or concerns pt shook his head "no". However, Clinical research associatewriter asked again and pt asked how much his bill was going to be. Writer apologized and informed that she doesn't know the bill amounts and that he would have to speak to the business office. Asked pt if there was anything else he wanted an answer to. Pt stated, "this thing is serious". Writer asked for clarification. Pt stated, "this depression is serious. My mind is going all kinds of ways". Stated, "that he needs his medication increased".  Pt has no other questions or concerns.    A:  Support and encouragement was offered. 15 min checks continued for safety.  R: Pt remains safe.

## 2017-06-30 MED ORDER — IBUPROFEN 600 MG PO TABS
600.0000 mg | ORAL_TABLET | Freq: Four times a day (QID) | ORAL | Status: DC | PRN
  Administered 2017-06-30 – 2017-07-02 (×2): 600 mg via ORAL
  Filled 2017-06-30 (×2): qty 1

## 2017-06-30 NOTE — Progress Notes (Signed)
Adult Psychoeducational Group Note  Date:  06/30/2017 Time:  8:51 PM  Group Topic/Focus:  Wrap-Up Group:   The focus of this group is to help patients review their daily goal of treatment and discuss progress on daily workbooks.  Participation Level:  Active  Participation Quality:  Appropriate  Affect:  Appropriate  Cognitive:  Appropriate  Insight: Appropriate  Engagement in Group:  Engaged  Modes of Intervention:  Discussion  Additional Comments:  The patient expressed that he rates today a 9.The patient also said that he sleep well today.  [name], [name] 06/30/2017, 8:51 PM

## 2017-06-30 NOTE — Progress Notes (Signed)
Patient ID: Garrett HarrisHtee Huegel, male   DOB: 1986-03-15, 32 y.o.   MRN: [id number]    D: Pt has been very flat and depressed on the unit today. Pt did not attend any groups nor did he engage in any treatment. Pt reported that he remained depressed and continued to have racing thoughts. Pt refused to fill out his patient self inventory sheet, he reported that he just wanted to rest. He did however report that he was still depressed and anxious. Pt reported being negative SI/HI, no AH/VH noted. A: 15 min checks continued for patient safety. R: Pt safety maintained.

## 2017-06-30 NOTE — [facility] Group Notes (Signed)
[facility] Group Notes:  (Nursing/MHT/Case Management/Adjunct)  Date:  06/30/2017  Time:  9:09 AM  Type of Therapy:  Orientation/Goals group  Participation Level:  Did Not Attend  Participation Quality:  Did Not Attend  Affect:  Did Not Attend  Cognitive:  Did Not Attend  Insight:  None  Engagement in Group:  Did Not Attend  Modes of Intervention:  Did Not Attend  Summary of Progress/Problems: Pt did not attend patient self inventory group/orientation group.   [name], [name] 06/30/2017, 9:09 AM

## 2017-06-30 NOTE — [facility] Group Notes (Signed)
[facility] Group Notes:  (Nursing/MHT/Case Management/Adjunct)  Date:  06/30/2017  Time:  9:12 AM  Type of Therapy:  Psychoeducational Skills  Participation Level:  Did Not Attend  Participation Quality:  Did not attend  Affect:  Did not attend  Cognitive:  Did not attend  Insight:  None  Engagement in Group:  Did not attend  Modes of Intervention:  Did not attend  Summary of Progress/Problems: Pt did not attend Psychoeducational group with topic anger management.   [name], Karoline Fleer Shanta 06/30/2017, 9:12 AM

## 2017-06-30 NOTE — Progress Notes (Signed)
[facility]  06/30/2017 11:42 AM Garrett HarrisHtee Edgar  MRN:  [id number]   Subjective:  Garrett [name] was seen with Garrett FindersKaren [name].  Patient is awake alert and oriented.  Seen resting in bed.  Continues to present with a flat and guared affect.  Patient reports headache and lower back pain.  Patient offered ibuprofen for pain. Garrett [name] reports taking his medications as prescribed and tolerating them well.  Reports he is sleeping well at night.  Denies suicidal or  homicidal ideations. Denies hearing voices or seeing things. (Auditory or visual hallucinations) patient seen pearing out the door during this assessment. Continues to deny paranoia or racing thoughts.  Patient is isolated to his room most of the day. Support reassurance and encouragement was provided    Principal Problem: MDD (major depressive disorder), recurrent, severe, with psychosis (HCC) Diagnosis:   Patient Active Problem List   Diagnosis Date Noted  . Major depressive disorder, recurrent severe without psychotic features (HCC) [F33.2] 06/20/2017  . MDD (major depressive disorder), recurrent, severe, with psychosis (HCC) [F33.3] 06/19/2017   Total Time spent with patient: 20 minutes  Past Psychiatric History:   Past Medical History:  Past Medical History:  Diagnosis Date  . Depression    History reviewed. No pertinent surgical history. Family History: History reviewed. No pertinent family history. Family Psychiatric  History:  Social History:  Social History   Substance and Sexual Activity  Alcohol Use Not on file     Social History   Substance and Sexual Activity  Drug Use Not on file    Social History   Socioeconomic History  . Marital status: Single    Spouse name: Not on file  . Number of children: Not on file  . Years of education: Not on file  . Highest education level: Not on file  Occupational History  . Not on file  Social Needs  . Financial resource strain: Not on file  . Food insecurity:    Worry:  Not on file    Inability: Not on file  . Transportation needs:    Medical: Not on file    Non-medical: Not on file  Tobacco Use  . Smoking status: Never Smoker  . Smokeless tobacco: Never Used  Substance and Sexual Activity  . Alcohol use: Not on file  . Drug use: Not on file  . Sexual activity: Not on file  Lifestyle  . Physical activity:    Days per week: Not on file    Minutes per session: Not on file  . Stress: Not on file  Relationships  . Social connections:    Talks on phone: Not on file    Gets together: Not on file    Attends religious service: Not on file    Active member of club or organization: Not on file    Attends meetings of clubs or organizations: Not on file    Relationship status: Not on file  Other Topics Concern  . Not on file  Social History Narrative  . Not on file   Additional Social History:                         Sleep: Fair  Appetite:  Fair  Current Medications: Current Facility-Administered Medications  Medication Dose Route Frequency Provider Last Rate Last Dose  . alum & mag hydroxide-simeth (MAALOX/MYLANTA) 200-200-20 MG/5ML suspension 30 mL  30 mL Oral Q4H PRN Charm RingsLord, Jamison Y, NP      .  escitalopram (LEXAPRO) tablet 10 mg  10 mg Oral Daily Oneta Rack, NP   10 mg at 06/30/17 1055  . feeding supplement (ENSURE ENLIVE) (ENSURE ENLIVE) liquid 237 mL  237 mL Oral BID BM Micheal Likens, MD   237 mL at 06/30/17 1055  . ibuprofen (ADVIL,MOTRIN) tablet 600 mg  600 mg Oral Q6H PRN Oneta Rack, NP   600 mg at 06/30/17 1055  . LORazepam (ATIVAN) tablet 0.5 mg  0.5 mg Oral BID Jolyne Loa T, MD   0.5 mg at 06/30/17 1055  . magnesium hydroxide (MILK OF MAGNESIA) suspension 30 mL  30 mL Oral Daily PRN Charm Rings, NP      . multivitamin with minerals tablet 1 tablet  1 tablet Oral Daily Micheal Likens, MD   1 tablet at 06/30/17 1055  . risperiDONE (RISPERDAL) tablet 2 mg  2 mg Oral BID Micheal Likens, MD   2 mg at 06/30/17 1055  . traZODone (DESYREL) tablet 100 mg  100 mg Oral QHS PRN,MR X 1 Nira Conn A, NP   100 mg at 06/29/17 2103    Lab Results: No results found for this or any previous visit (from the past 48 hour(s)).  Blood Alcohol level:  Lab Results  Component Value Date   ETH <10 06/18/2017   ETH  12/11/2008    <5        LOWEST DETECTABLE LIMIT FOR SERUM ALCOHOL IS 5 mg/dL FOR MEDICAL PURPOSES ONLY    Metabolic Disorder Labs: Lab Results  Component Value Date   HGBA1C 5.0 06/22/2017   MPG 96.8 06/22/2017   Lab Results  Component Value Date   PROLACTIN 32.9 (H) 06/22/2017   Lab Results  Component Value Date   CHOL 199 06/22/2017   TRIG 107 06/22/2017   HDL 43 06/22/2017   CHOLHDL 4.6 06/22/2017   VLDL 21 06/22/2017   LDLCALC 135 (H) 06/22/2017    Physical Findings: AIMS: Facial and Oral Movements Muscles of Facial Expression: None, normal Lips and Perioral Area: None, normal Jaw: None, normal Tongue: None, normal,Extremity Movements Upper (arms, wrists, hands, fingers): None, normal Lower (legs, knees, ankles, toes): None, normal, Trunk Movements Neck, shoulders, hips: None, normal, Overall Severity Severity of abnormal movements (highest score from questions above): None, normal Incapacitation due to abnormal movements: None, normal Patient's awareness of abnormal movements (rate only patient's report): No Awareness, Dental Status Current problems with teeth and/or dentures?: No Does patient usually wear dentures?: No  CIWA:    COWS:     Musculoskeletal: Strength & Muscle Tone: within normal limits Gait & Station: normal Patient leans: N/A  Psychiatric Specialty Exam: Physical Exam  Constitutional: He appears well-developed.  Neurological: He is alert.  Psychiatric: He has a normal mood and affect. His behavior is normal.    Review of Systems  Psychiatric/Behavioral: Positive for depression. Negative for hallucinations  and suicidal ideas. The patient is nervous/anxious.   All other systems reviewed and are negative.   Blood pressure (!) 106/56, pulse (!) 115, temperature 98.2 F (36.8 C), temperature source Oral, resp. rate 18, height 5\' 2"  (1.575 m), weight 53.1 kg (117 lb), SpO2 98 %.Body mass index is 21.4 kg/m.  General Appearance: Casual  Eye Contact:  Fair  Speech:  Clear and Coherent  Volume:  Normal  Mood:  Anxious and Depressed  Affect:  Blunt  Thought Process:  Coherent  Orientation:  Full (Time, Place, and Person)  Thought Content:  Hallucinations: Auditory  Suicidal Thoughts:  No  Homicidal Thoughts:  No  Memory:  Immediate;   Fair Remote;   Fair  Judgement:  Fair  Insight:  Fair  Psychomotor Activity:  Normal  Concentration:  Concentration: Fair  Recall:  Good  Fund of Knowledge:  Fair  Language:  Fair  Akathisia:  No  Handed:  Right  AIMS (if indicated):     Assets:  Communication Skills Desire for Improvement Resilience Social Support  ADL's:  Intact  Cognition:  WNL  Sleep:  Number of Hours: 6.75     Treatment Plan Summary: Daily contact with patient to assess and evaluate symptoms and progress in treatment and Medication management   Continue with current treatment plan 06/30/2017 except where noted  - MDD with psychotic features - Continue lexapro10mg  po qDay - Continuerisperdal 1mg  po qAM + 2mg  po qhs  - Catatonic features - Continueativan 0.5mg  po TID  - Poor nutritional intake - Continue Ensure BID between meals  - Insomnia -Continuetrazodone 100mg  po qhs prn insomnia (may repeat x1)  -Encourage participation in groups and therapeutic milieu -Disposition planning will be ongoing    Oneta Rack, NP 06/30/2017, 11:42 AM

## 2017-06-30 NOTE — [facility] Group Notes (Signed)
[facility] Group Notes: (Clinical Social Work)   06/30/2017      Type of Therapy:  Group Therapy   Participation Level:  Did Not Attend despite MHT prompting - said his head was hurting   Ambrose MantleMareida Grossman-Orr, LCSW 06/30/2017, 1:13 PM

## 2017-06-30 NOTE — Progress Notes (Signed)
D.  Pt in room on approach, interpreter present.  Pt denies complaints at this time.  Pt remains in room most of day due to language barrier.  Pt came out of room for snack but returned shortly thereafter.  Pt is taking medication as ordered.  Pt denies SI/HI/AVH at this time through interpreter.  Interpreter stayed for approximately one hour.  A.  Support and encouragement offered, medication given as ordered  R.  Pt remains safe on unit, will continue to monitor.

## 2017-07-01 NOTE — Plan of Care (Signed)
Nurse discussed anxiety, depression, coping skills with patient. 

## 2017-07-01 NOTE — Plan of Care (Signed)
Nurse discussed depression, anxiety, coping skills with patient.  

## 2017-07-01 NOTE — Progress Notes (Signed)
D:  Patient's self inventory sheet, patient sleeps good, sleep medication helpful.  .  Good appetite, low energy level, good concentration.  Denied SI, contracts for safety.  Physical problems, dizziness.  Physical pain.  Pain medication helpful.  No goal today.  No questions.  No discharge plans. A:  Medications administered per MD orders.  Emotional support and encouragement given patient. R:  Safety maintained with 15 minute checks. .Marland Kitchen

## 2017-07-01 NOTE — [facility] Group Notes (Signed)
[facility] LCSW Group Therapy Note  Date/Time:  07/01/2017  11:00AM-12:00PM  Type of Therapy and Topic:  Group Therapy:  Music and Mood  Participation Level:  Minimal   Description of Group: In this process group, members listened to a variety of genres of music and identified that different types of music evoke different responses.  Patients were encouraged to identify music that was soothing for them and music that was energizing for them.  Patients discussed how this knowledge can help with wellness and recovery in various ways including managing depression and anxiety as well as encouraging healthy sleep habits.    Therapeutic Goals: 1. Patients will explore the impact of different varieties of music on mood 2. Patients will verbalize the thoughts they have when listening to different types of music 3. Patients will identify music that is soothing to them as well as music that is energizing to them 4. Patients will discuss how to use this knowledge to assist in maintaining wellness and recovery 5. Patients will explore the use of music as a coping skill  Summary of Patient Progress:  At the beginning of group, patient expressed that he felt good but lonely.  He did not really respond to any of the music, even when CSW tried to play a song in his language.  Therapeutic Modalities: Solution Focused Brief Therapy Activity   Ambrose MantleMareida Grossman-Orr, LCSW

## 2017-07-01 NOTE — Progress Notes (Signed)
[facility] MD Progress Note  07/01/2017 1:45 PM Garrett [name]  MRN:  [id number]   Subjective:  [name] was seen with [name]. [name] seen resting on the floor eating breakfast.  Continues to present paranoid and internally preoccupied.  Patient is malodorous, hygiene was encouraged this morning.  Per interpreter patient had friends and family visit on Friday.  Reports visit went well.  Denies any concerns during this assessment. [name]  is medication compliant and tolerating medications well.  No side effects reported.  Denies insomnia nightmares or terrors.  CSW to continue working on discharge disposition.  Support reassurance and encouragement was provided  History: Per assessment note:Garrett [name] (Prounounced "Tee See") is a 32 y/o M originally from [place] (primary language is [name]) with previous psychiatric history of MDD with psychotic features who presented as a voluntary walk in to [facility] accompanied by his pastor with worsening symptoms of psychosis and disorganization. Pt was responding to internal stimuli and he reported increased depression and anxiety in context of losing his job on 05/24/17. In ED pt was guarded with thought blocking, but he was agreeable to inpatient hospitalization. He was started on low dose of risperdal and transferred to [facility]     Principal Problem: MDD (major depressive disorder), recurrent, severe, with psychosis (HCC) Diagnosis:   Patient Active Problem List   Diagnosis Date Noted  . Major depressive disorder, recurrent severe without psychotic features (HCC) [F33.2] 06/20/2017  . MDD (major depressive disorder), recurrent, severe, with psychosis (HCC) [F33.3] 06/19/2017   Total Time spent with patient: 20 minutes  Past Psychiatric History:   Past Medical History:  Past Medical History:  Diagnosis Date  . Depression    History reviewed. No pertinent surgical history. Family History: History reviewed. No pertinent family history. Family Psychiatric  History:   Social History:  Social History   Substance and Sexual Activity  Alcohol Use Not on file     Social History   Substance and Sexual Activity  Drug Use Not on file    Social History   Socioeconomic History  . Marital status: Single    Spouse name: Not on file  . Number of children: Not on file  . Years of education: Not on file  . Highest education level: Not on file  Occupational History  . Not on file  Social Needs  . Financial resource strain: Not on file  . Food insecurity:    Worry: Not on file    Inability: Not on file  . Transportation needs:    Medical: Not on file    Non-medical: Not on file  Tobacco Use  . Smoking status: Never Smoker  . Smokeless tobacco: Never Used  Substance and Sexual Activity  . Alcohol use: Not on file  . Drug use: Not on file  . Sexual activity: Not on file  Lifestyle  . Physical activity:    Days per week: Not on file    Minutes per session: Not on file  . Stress: Not on file  Relationships  . Social connections:    Talks on phone: Not on file    Gets together: Not on file    Attends religious service: Not on file    Active member of club or organization: Not on file    Attends meetings of clubs or organizations: Not on file    Relationship status: Not on file  Other Topics Concern  . Not on file  Social History Narrative  . Not on file  Additional Social History:                         Sleep: Fair  Appetite:  Fair  Current Medications: Current Facility-Administered Medications  Medication Dose Route Frequency Provider Last Rate Last Dose  . alum & mag hydroxide-simeth (MAALOX/MYLANTA) 200-200-20 MG/5ML suspension 30 mL  30 mL Oral Q4H PRN Charm Rings, NP      . escitalopram (LEXAPRO) tablet 10 mg  10 mg Oral Daily Oneta Rack, NP   10 mg at 07/01/17 0835  . feeding supplement (ENSURE ENLIVE) (ENSURE ENLIVE) liquid 237 mL  237 mL Oral BID BM Micheal Likens, MD   237 mL at 07/01/17 0921   . ibuprofen (ADVIL,MOTRIN) tablet 600 mg  600 mg Oral Q6H PRN Oneta Rack, NP   600 mg at 06/30/17 1055  . LORazepam (ATIVAN) tablet 0.5 mg  0.5 mg Oral BID Micheal Likens, MD   0.5 mg at 07/01/17 0835  . magnesium hydroxide (MILK OF MAGNESIA) suspension 30 mL  30 mL Oral Daily PRN Charm Rings, NP      . multivitamin with minerals tablet 1 tablet  1 tablet Oral Daily Micheal Likens, MD   1 tablet at 07/01/17 [phone]  . risperiDONE (RISPERDAL) tablet 2 mg  2 mg Oral BID Micheal Likens, MD   2 mg at 07/01/17 0835  . traZODone (DESYREL) tablet 100 mg  100 mg Oral QHS PRN,MR X 1 Nira Conn A, NP   100 mg at 06/30/17 2056    Lab Results: No results found for this or any previous visit (from the past 48 hour(s)).  Blood Alcohol level:  Lab Results  Component Value Date   ETH <10 06/18/2017   ETH  12/11/2008    <5        LOWEST DETECTABLE LIMIT FOR SERUM ALCOHOL IS 5 mg/dL FOR MEDICAL PURPOSES ONLY    Metabolic Disorder Labs: Lab Results  Component Value Date   HGBA1C 5.0 06/22/2017   MPG 96.8 06/22/2017   Lab Results  Component Value Date   PROLACTIN 32.9 (H) 06/22/2017   Lab Results  Component Value Date   CHOL 199 06/22/2017   TRIG 107 06/22/2017   HDL 43 06/22/2017   CHOLHDL 4.6 06/22/2017   VLDL 21 06/22/2017   LDLCALC 135 (H) 06/22/2017    Physical Findings: AIMS: Facial and Oral Movements Muscles of Facial Expression: None, normal Lips and Perioral Area: None, normal Jaw: None, normal Tongue: None, normal,Extremity Movements Upper (arms, wrists, hands, fingers): None, normal Lower (legs, knees, ankles, toes): None, normal, Trunk Movements Neck, shoulders, hips: None, normal, Overall Severity Severity of abnormal movements (highest score from questions above): None, normal Incapacitation due to abnormal movements: None, normal Patient's awareness of abnormal movements (rate only patient's report): No Awareness, Dental  Status Current problems with teeth and/or dentures?: No Does patient usually wear dentures?: No  CIWA:    COWS:     Musculoskeletal: Strength & Muscle Tone: within normal limits Gait & Station: normal Patient leans: N/A  Psychiatric Specialty Exam: Physical Exam  Constitutional: He appears well-developed.  Neurological: He is alert.  Psychiatric: He has a normal mood and affect. His behavior is normal.    Review of Systems  Psychiatric/Behavioral: Positive for depression. Negative for hallucinations and suicidal ideas. The patient is nervous/anxious.   All other systems reviewed and are negative.   Blood pressure (!) 106/56, pulse 87, temperature  97.7 F (36.5 C), temperature source Oral, resp. rate 16, height 5\' 2"  (1.575 m), weight 53.1 kg (117 lb), SpO2 98 %.Body mass index is 21.4 kg/m.  General Appearance: Disheveled  paper scrubs and malodorous   Eye Contact:  Fair  Speech:  Clear and Coherent  Volume:  Normal  Mood:  Anxious and Depressed  Affect:  Blunt  Thought Process:  Coherent  Orientation:  Full (Time, Place, and Person)  Thought Content:  Hallucinations: Auditory  Suicidal Thoughts:  No  Homicidal Thoughts:  No  Memory:  Immediate;   Fair Remote;   Fair  Judgement:  Fair  Insight:  Fair  Psychomotor Activity:  Normal  Concentration:  Concentration: Fair  Recall:  Good  Fund of Knowledge:  Fair  Language:  Fair  Akathisia:  No  Handed:  Right  AIMS (if indicated):     Assets:  Communication Skills Desire for Improvement Resilience Social Support  ADL's:  Intact  Cognition:  WNL  Sleep:  Number of Hours: 6.75     Treatment Plan Summary: Daily contact with patient to assess and evaluate symptoms and progress in treatment and Medication management   Continue with current treatment plan 07/01/2017 except where noted  - MDD with psychotic features - Continue lexapro10mg  po qDay - Continuerisperdal 1mg  po qAM + 2mg  po  qhs  - Catatonic features - Continueativan 0.5mg  po TID  - Poor nutritional intake - Continue Ensure BID between meals  - Insomnia -Continuetrazodone 100mg  po qhs prn insomnia (may repeat x1)  -Encourage participation in groups and therapeutic milieu -Disposition planning will be ongoing    Oneta Rackanika N Derrian Rodak, NP 07/01/2017, 1:45 PM

## 2017-07-01 NOTE — Progress Notes (Signed)
D: Pt denies SI/HI/AVH. Pt is pleasant and cooperative. Pt stated he was doing a little better.   A: Pt was offered support and encouragement. Pt was given scheduled medications. Pt was encourage to attend groups. Q 15 minute checks were done for safety.   R:Pt attends groups and interacts well with peers and staff. Pt is taking medication. Pt has no complaints.Pt receptive to treatment and safety maintained on unit.

## 2017-07-01 NOTE — [facility] Group Notes (Signed)
[facility] Group Notes:  (Nursing/MHT/Case Management/Adjunct)  Date:  07/01/2017  Time:  9:32 AM  Type of Therapy:  Orientation/Goals group  Participation Level:  Did Not Attend  Participation Quality:  Did Not Attend  Affect:  Did Not Attend  Cognitive:  Did Not Attend  Insight:  None  Engagement in Group:  Did Not Attend  Modes of Intervention:  Did Not Attend  Summary of Progress/Problems: Pt did not attend patient self inventory group/orientation group.    [name], Hridaan Bouse Shanta 07/01/2017, 9:32 AM

## 2017-07-01 NOTE — [facility] Group Notes (Signed)
[facility] Group Notes:  (Nursing/MHT/Case Management/Adjunct)  Date:  07/01/2017  Time:  4:05 PM  Type of Therapy:  Psychoeducational Skills  Participation Level:  Active  Participation Quality:  Appropriate  Affect:  Appropriate  Cognitive:  Appropriate  Insight:  Appropriate  Engagement in Group:  Engaged  Modes of Intervention:  Problem-solving  Summary of Progress/Problems: Pt attended Psychoeducational group with top topic healthy support systems.   V[place] Folden Shanta 07/01/2017, 4:05 PM 

## 2017-07-01 NOTE — Plan of Care (Signed)
  Problem: Coping: Goal: Ability to verbalize feelings will improve Outcome: Progressing   Problem: Safety: Goal: Ability to disclose and discuss suicidal ideas will improve Outcome: Progressing   Problem: Activity: Goal: Sleeping patterns will improve Outcome: Progressing

## 2017-07-02 MED ORDER — IBUPROFEN 600 MG PO TABS
600.0000 mg | ORAL_TABLET | Freq: Two times a day (BID) | ORAL | Status: DC
  Administered 2017-07-03 – 2017-07-05 (×5): 600 mg via ORAL
  Filled 2017-07-02 (×9): qty 1

## 2017-07-02 NOTE — Tx Team (Signed)
Interdisciplinary Treatment and Diagnostic Plan Update  07/02/2017 Time of Session: 3:16 PM  Morey Andonian MRN: [id number]  Principal Diagnosis: MDD (major depressive disorder), recurrent, severe, with psychosis ([place])  Secondary Diagnoses: Principal Problem:   MDD (major depressive disorder), recurrent, severe, with psychosis ([place])   Current Medications:  Current Facility-Administered Medications  Medication Dose Route Frequency Provider Last Rate Last Dose  . alum & mag hydroxide-simeth (MAALOX/MYLANTA) 200-200-20 MG/5ML suspension 30 mL  30 mL Oral Q4H PRN Patrecia Pour, NP      . escitalopram (LEXAPRO) tablet 10 mg  10 mg Oral Daily Derrill Center, NP   10 mg at 07/02/17 0749  . feeding supplement (ENSURE ENLIVE) (ENSURE ENLIVE) liquid 237 mL  237 mL Oral BID BM Pennelope Bracken, MD   237 mL at 07/02/17 1100  . [START ON 07/03/2017] ibuprofen (ADVIL,MOTRIN) tablet 600 mg  600 mg Oral BID Derrill Center, NP      . LORazepam (ATIVAN) tablet 0.5 mg  0.5 mg Oral BID Pennelope Bracken, MD   0.5 mg at 07/02/17 0749  . magnesium hydroxide (MILK OF MAGNESIA) suspension 30 mL  30 mL Oral Daily PRN Patrecia Pour, NP      . multivitamin with minerals tablet 1 tablet  1 tablet Oral Daily Pennelope Bracken, MD   1 tablet at 07/02/17 0749  . risperiDONE (RISPERDAL) tablet 2 mg  2 mg Oral BID Pennelope Bracken, MD   2 mg at 07/02/17 0750  . traZODone (DESYREL) tablet 100 mg  100 mg Oral QHS PRN,MR X 1 Lindon Romp A, NP   100 mg at 07/01/17 2052    PTA Medications: No medications prior to admission.    Patient Stressors: Occupational concerns  Patient Strengths: Capable of independent living Motivation for treatment/growth Supportive family/friends  Treatment Modalities: Medication Management, Group therapy, Case management,  1 to 1 session with clinician, Psychoeducation, Recreational therapy.   Physician Treatment Plan for Primary Diagnosis: MDD (major  depressive disorder), recurrent, severe, with psychosis ([place]) Long Term Goal(s): Improvement in symptoms so as ready for discharge  Short Term Goals: Ability to identify changes in lifestyle to reduce recurrence of condition will improve Ability to identify and develop effective coping behaviors will improve  Medication Management: Evaluate patient's response, side effects, and tolerance of medication regimen.  Therapeutic Interventions: 1 to 1 sessions, Unit Group sessions and Medication administration.  Evaluation of Outcomes: Progressing  Physician Treatment Plan for Secondary Diagnosis: Principal Problem:   MDD (major depressive disorder), recurrent, severe, with psychosis ([place])   Long Term Goal(s): Improvement in symptoms so as ready for discharge  Short Term Goals: Ability to identify changes in lifestyle to reduce recurrence of condition will improve Ability to identify and develop effective coping behaviors will improve  Medication Management: Evaluate patient's response, side effects, and tolerance of medication regimen.  Therapeutic Interventions: 1 to 1 sessions, Unit Group sessions and Medication administration.  Evaluation of Outcomes: Progressing   3/27: Pt was encouraged to improve his oral intake of food and water, and he nodded in understanding. Discussed with patient that we will restart previous medication of ativan to address his worsening catatonic features   - MDD with psychotic features - Continue lexapro5m po qDay - Continuerisperdal 157mpo qAM + 47m4mo qhs  - Catatonic features - Restartativan 0.5mg49m TID  4/1: Patient has concerns with generalized body aches and pains attributed to resting in bed.     - MDD with  psychotic features - Continue lexapro74m po qDay - Continuerisperdal 1420mpo qAM + 20m41mo qhs  - Catatonic features - Continueativan 0.5mg53m  TID     RN Treatment Plan for Primary Diagnosis: MDD (major depressive disorder), recurrent, severe, with psychosis ([facility])[place] Term Goal(s): Knowledge of disease and therapeutic regimen to maintain health will improve  Short Term Goals: Ability to identify and develop effective coping behaviors will improve and Compliance with prescribed medications will improve  Medication Management: RN will administer medications as ordered by provider, will assess and evaluate patient's response and provide education to patient for prescribed medication. RN will report any adverse and/or side effects to prescribing provider.  Therapeutic Interventions: 1 on 1 counseling sessions, Psychoeducation, Medication administration, Evaluate responses to treatment, Monitor vital signs and CBGs as ordered, Perform/monitor CIWA, COWS, AIMS and Fall Risk screenings as ordered, Perform wound care treatments as ordered.  Evaluation of Outcomes: Progressing   LCSW Treatment Plan for Primary Diagnosis: MDD (major depressive disorder), recurrent, severe, with psychosis (HCC)Pothng Term Goal(s): Safe transition to appropriate next level of care at discharge, Engage patient in therapeutic group addressing interpersonal concerns.  Short Term Goals: Engage patient in aftercare planning with referrals and resources  Therapeutic Interventions: Assess for all discharge needs, 1 to 1 time with Social worker, Explore available resources and support systems, Assess for adequacy in community support network, Educate family and significant other(s) on suicide prevention, Complete Psychosocial Assessment, Interpersonal group therapy.  Evaluation of Outcomes: Met  [facility]: Attending groups: Yes Participating in groups:Minimally Taking medication as prescribed: Yes Toleration medication: Yes, no side effects reported at this time Family/Significant other contact made:  Yes-pastor Patient understands diagnosis: No  Limited insight Discussing patient identified problems/goals with staff: Yes Medical problems stabilized or resolved: Yes Denies suicidal/homicidal ideation: Yes Issues/concerns per patient self-inventory: None Other: N/A  New problem(s) identified: None identified at this time.   New Short Term/Long Term Goal(s): "I want to get better, and take care of my health."  Discharge Plan or Barriers:   Reason for Continuation of Hospitalization: Depression Psychosis Medication stabilization   Estimated Length of Stay: 4/5  Attendees: Patient:  07/02/2017  3:16 PM  Physician: ChriMaris Berger 07/02/2017  3:16 PM  Nursing: ElizReal Cons4/04/2017  3:16 PM  RN Care Manager: JennLars Pinks 07/02/2017  3:16 PM  Social Worker: Rod Ripley Fraise/2019  3:16 PM  Recreational Therapist: MarjWinfield Cunas/2019  3:16 PM  Other: DeloNorberto Sorenson/2019  3:16 PM  Other:  07/02/2017  3:16 PM    Scribe for Treatment Team:  RodnRoque LiasW 07/02/2017 3:16 PM

## 2017-07-02 NOTE — Plan of Care (Signed)
Nurse discussed depression, anxiety, coping skills with patient.  Interpreter present with patient today.

## 2017-07-02 NOTE — [facility] Group Notes (Signed)
LCSW Group Therapy Note   07/02/2017 1:15pm   Type of Therapy and Topic:  Group Therapy:  Overcoming Obstacles   Participation Level:  Minimal   Description of Group:    In this group patients will be encouraged to explore what they see as obstacles to their own wellness and recovery. They will be guided to discuss their thoughts, feelings, and behaviors related to these obstacles. The group will process together ways to cope with barriers, with attention given to specific choices patients can make. Each patient will be challenged to identify changes they are motivated to make in order to overcome their obstacles. This group will be process-oriented, with patients participating in exploration of their own experiences as well as giving and receiving support and challenge from other group members.   Therapeutic Goals: 1. Patient will identify personal and current obstacles as they relate to admission. 2. Patient will identify barriers that currently interfere with their wellness or overcoming obstacles.  3. Patient will identify feelings, thought process and behaviors related to these barriers. 4. Patient will identify two changes they are willing to make to overcome these obstacles:      Summary of Patient Progress  Stayed the entire time, engaged throughout.  "My biggest obstacle is that I don't know where I will go when I leave."  Asked him if he was going back to him roomates.  "I haven't decided yet."  Nothing more to contribute.    Therapeutic Modalities:   Cognitive Behavioral Therapy Solution Focused Therapy Motivational Interviewing Relapse Prevention Therapy  Ida [name] B Louise Rawson, LCSW 07/02/2017 3:15 PM

## 2017-07-02 NOTE — Progress Notes (Addendum)
Recreation Therapy Notes  Date: 4.1.19 Time: 10:00 a.m Location: [address] Dayroom   Group Topic: Triggers   Goal Area(s) Addresses:  Goal 1.1: To identify triggers and coping skills  - Group will identify at least three triggers for anxiety  - Group will identify at least three coping skill for anxiety   - Group will identify at least three thoughts and physical symptoms for anxiety  - Group will participate in Recreation Therapy tx.   Intervention: Worksheet    Activity: Patients completed an anxiety worksheet, identifying triggers for anxiety, physical symptoms, thoughts, and ways to cope from triggers   Education: Triggers, Coping Skills   Education Outcome: Acknowledges Education  Clinical Observations/Feedback: Patient did not attend    Sheryle HailDarian Amyre Segundo, Recreation Therapy Intern   Sheryle HailDarian Anaika Santillano 07/02/2017 10:44 AM

## 2017-07-02 NOTE — Progress Notes (Signed)
D:    Patient denied SI and HI, contracts for safety.  Denied A/V hallucinations.  Denied pain. A:  Medications administered per MD orders.  Emotional support and encouragement given patient. R:  Safety maintained with 15 minute checks. Patient's interpreter was with him today.

## 2017-07-02 NOTE — Progress Notes (Addendum)
[facility] MD Progress Note  07/02/2017 12:55 PM Garrett [name]  MRN:  [id number]   Subjective:  Garrett [name].  Patient seen sitting in bed.  He is awake alert and oriented.  Patient was evaluated by NP and in MD. continues to present flat guarded however overall mood and behavior is improving.  Patient has concerns with generalized body aches and pains attributed to resting in bed.  Patient continues to deny auditory or visual hallucinations.  Denies seeing ghosts or spirits.  Denies any paranoid ideations.  Treatment team to make ibuprofen 600 mg  schedule twice daily.  (Due to language barrier) Garrett [name]  is medication compliant and tolerating medications well.  No side effects reported. MD to consider discharge early this week. Garrett [name] was encouraged to attend group session with participation.  Denies insomnia nightmares or terrors.  CSW to continue working on discharge disposition.  Support reassurance and encouragement was provided  History: Per assessment note:Garrett [name] (Garrett "Tee See") is a 32 y/o M originally from [place] (primary language is [name]) with previous psychiatric history of MDD with psychotic features who presented as a voluntary walk in to [facility] accompanied by his pastor with worsening symptoms of psychosis and disorganization. Pt was responding to internal stimuli and he reported increased depression and anxiety in context of losing his job on 05/24/17. In ED pt was guarded with thought blocking, but he was agreeable to inpatient hospitalization. He was started on low dose of risperdal and transferred to [facility]  Principal Problem: MDD (major depressive disorder), recurrent, severe, with psychosis (HCC) Diagnosis:   Patient Active Problem List   Diagnosis Date Noted  . Major depressive disorder, recurrent severe without psychotic features (HCC) [F33.2] 06/20/2017  . MDD (major depressive disorder), recurrent, severe, with psychosis ([facility]CC) [F33.3] 06/19/2017   Total Time spent  with patient: 20 minutes  Past Psychiatric History:   Past Medical History:  Past Medical History:  Diagnosis Date  . Depression    History reviewed. No pertinent surgical history. Family History: History reviewed. No pertinent family history. Family Psychiatric  History:  Social History:  Social History   Substance and Sexual Activity  Alcohol Use Not on file     Social History   Substance and Sexual Activity  Drug Use Not on file    Social History   Socioeconomic History  . Marital status: Single    Spouse name: Not on file  . Number of children: Not on file  . Years of education: Not on file  . Highest education level: Not on file  Occupational History  . Not on file  Social Needs  . Financial resource strain: Not on file  . Food insecurity:    Worry: Not on file    Inability: Not on file  . Transportation needs:    Medical: Not on file    Non-medical: Not on file  Tobacco Use  . Smoking status: Never Smoker  . Smokeless tobacco: Never Used  Substance and Sexual Activity  . Alcohol use: Not on file  . Drug use: Not on file  . Sexual activity: Not on file  Lifestyle  . Physical activity:    Days per week: Not on file    Minutes per session: Not on file  . Stress: Not on file  Relationships  . Social connections:    Talks on phone: Not on file    Gets together: Not on file    Attends religious service: Not on file  Active member of club or organization: Not on file    Attends meetings of clubs or organizations: Not on file    Relationship status: Not on file  Other Topics Concern  . Not on file  Social History Narrative  . Not on file   Additional Social History:                         Sleep: Fair  Appetite:  Fair  Current Medications: Current Facility-Administered Medications  Medication Dose Route Frequency Provider Last Rate Last Dose  . alum & mag hydroxide-simeth (MAALOX/MYLANTA) 200-200-20 MG/5ML suspension 30 mL  30 mL  Oral Q4H PRN Charm Rings, NP      . escitalopram (LEXAPRO) tablet 10 mg  10 mg Oral Daily Oneta Rack, NP   10 mg at 07/02/17 0749  . feeding supplement (ENSURE ENLIVE) (ENSURE ENLIVE) liquid 237 mL  237 mL Oral BID BM Garrett Likens, MD   237 mL at 07/02/17 1100  . [START ON 07/03/2017] ibuprofen (ADVIL,MOTRIN) tablet 600 mg  600 mg Oral BID Oneta Rack, NP      . LORazepam (ATIVAN) tablet 0.5 mg  0.5 mg Oral BID Garrett Likens, MD   0.5 mg at 07/02/17 0749  . magnesium hydroxide (MILK OF MAGNESIA) suspension 30 mL  30 mL Oral Daily PRN Charm Rings, NP      . multivitamin with minerals tablet 1 tablet  1 tablet Oral Daily Garrett Likens, MD   1 tablet at 07/02/17 0749  . risperiDONE (RISPERDAL) tablet 2 mg  2 mg Oral BID Garrett Likens, MD   2 mg at 07/02/17 0750  . traZODone (DESYREL) tablet 100 mg  100 mg Oral QHS PRN,MR X 1 Nira Conn A, NP   100 mg at 07/01/17 2052    Lab Results: No results found for this or any previous visit (from the past 48 hour(s)).  Blood Alcohol level:  Lab Results  Component Value Date   ETH <10 06/18/2017   ETH  12/11/2008    <5        LOWEST DETECTABLE LIMIT FOR SERUM ALCOHOL IS 5 mg/dL FOR MEDICAL PURPOSES ONLY    Metabolic Disorder Labs: Lab Results  Component Value Date   HGBA1C 5.0 06/22/2017   MPG 96.8 06/22/2017   Lab Results  Component Value Date   PROLACTIN 32.9 (H) 06/22/2017   Lab Results  Component Value Date   CHOL 199 06/22/2017   TRIG 107 06/22/2017   HDL 43 06/22/2017   CHOLHDL 4.6 06/22/2017   VLDL 21 06/22/2017   LDLCALC 135 (H) 06/22/2017    Physical Findings: AIMS: Facial and Oral Movements Muscles of Facial Expression: None, normal Lips and Perioral Area: None, normal Jaw: None, normal Tongue: None, normal,Extremity Movements Upper (arms, wrists, hands, fingers): None, normal Lower (legs, knees, ankles, toes): None, normal, Trunk Movements Neck, shoulders,  hips: None, normal, Overall Severity Severity of abnormal movements (highest score from questions above): None, normal Incapacitation due to abnormal movements: None, normal Patient's awareness of abnormal movements (rate only patient's report): No Awareness, Dental Status Current problems with teeth and/or dentures?: No Does patient usually wear dentures?: No  CIWA:  CIWA-Ar Total: 1 COWS:  COWS Total Score: 5  Musculoskeletal: Strength & Muscle Tone: within normal limits Gait & Station: normal Patient leans: N/A  Psychiatric Specialty Exam: Physical Exam  Vitals reviewed. Constitutional: He appears well-developed.  Neurological: He is  alert.  Psychiatric: He has a normal mood and affect. His behavior is normal.    Review of Systems  Musculoskeletal:       Neck, shoulder and Lower back pain  Psychiatric/Behavioral: Positive for depression. Negative for hallucinations and suicidal ideas. The patient is nervous/anxious.   All other systems reviewed and are negative.   Blood pressure (!) 104/46, pulse (!) 112, temperature 98.1 F (36.7 C), temperature source Oral, resp. rate 16, height 5\' 2"  (1.575 m), weight 53.1 kg (117 lb), SpO2 98 %.Body mass index is 21.4 kg/m.  General Appearance: Disheveled  paper scrubs, hygiene is improving   Eye Contact:  Fair  Speech:  Clear and Coherent  Volume:  Normal  Mood:  Anxious and Depressed  Affect:  Blunt  Thought Process:  Coherent  Orientation:  Full (Time, Place, and Person)  Thought Content:  Hallucinations: None was denied during this assessment.   Suicidal Thoughts:  No  Homicidal Thoughts:  No  Memory:  Immediate;   Fair Remote;   Fair  Judgement:  Fair  Insight:  Fair  Psychomotor Activity:  Normal  Concentration:  Concentration: Fair  Recall:  Good  Fund of Knowledge:  Fair  Language:  Fair  Akathisia:  No  Handed:  Right  AIMS (if indicated):     Assets:  Communication Skills Desire for  Improvement Resilience Social Support  ADL's:  Intact  Cognition:  WNL  Sleep:  Number of Hours: 6.75   Treatment Plan Summary: Daily contact with patient to assess and evaluate symptoms and progress in treatment and Medication management   Continue with current treatment plan 07/02/2017 except where noted  - MDD with psychotic features - Continue lexapro10mg  po qDay - Continuerisperdal 1mg  po qAM + 2mg  po qhs  - Catatonic features - Continueativan 0.5mg  po TID  - Poor nutritional intake - Continue Ensure BID between meals  - Insomnia -Continuetrazodone 100mg  po qhs prn insomnia (may repeat x1)  -Encourage participation in groups and therapeutic milieu -Disposition planning will be ongoing    Oneta Rackanika N Shataya Winkles, NP 07/02/2017, 12:55 PM

## 2017-07-03 NOTE — Plan of Care (Signed)
  Problem: Coping: Goal: Ability to cope will improve Outcome: Progressing   Problem: Safety: Goal: Ability to disclose and discuss suicidal ideas will improve Outcome: Progressing

## 2017-07-03 NOTE — Progress Notes (Signed)
D: Pt denies SI/HI/AVH. Pt is pleasant and cooperative. Pt visible on the unit at this time.   A: Pt was offered support and encouragement. Pt was given scheduled medications. Pt was encourage to attend groups. Q 15 minute checks were done for safety.   R: safety maintained on unit.

## 2017-07-03 NOTE — Plan of Care (Signed)
  Problem: Coping: Goal: Ability to cope will improve Outcome: Progressing   Problem: Safety: Goal: Ability to disclose and discuss suicidal ideas will improve Outcome: Progressing   Problem: Activity: Goal: Sleeping patterns will improve Outcome: Progressing   Problem: Self-Concept: Goal: Ability to disclose and discuss suicidal ideas will improve Outcome: Progressing

## 2017-07-03 NOTE — Progress Notes (Signed)
D: Pt denies SI/HI/VH, AH- not as bad. Pt is pleasant and cooperative. Pt worried he has no place to go on D/C , pt said he could possibly stay with a friend.   A: Pt was offered support and encouragement. Pt was given scheduled medications. Pt was encourage to attend groups. Q 15 minute checks were done for safety.   R:Pt attends groups and interacts well with peers and staff. Pt is taking medication. Pt has no complaints.Pt receptive to treatment and safety maintained on unit.

## 2017-07-03 NOTE — Progress Notes (Signed)
Adult Psychoeducational Group Note  Date:  07/03/2017 Time:  9:03 PM  Group Topic/Focus:  Wrap-Up Group:   The focus of this group is to help patients review their daily goal of treatment and discuss progress on daily workbooks.  Participation Level:  Active  Participation Quality:  Appropriate  Affect:  Appropriate  Cognitive:  Appropriate  Insight: Appropriate  Engagement in Group:  Engaged  Modes of Intervention:  Discussion  Additional Comments: The patient expressed that he rates today a 9.The patient also said that he attended all groups.  [name], Layni Kreamer Lee 07/03/2017, 9:03 PM

## 2017-07-03 NOTE — Progress Notes (Signed)
Recreation Therapy Notes  Date: 4.2.19 Time: 10:00 p.m. Location: [address] Dayroom   Group Topic: Self-Esteem   Goal Area(s) Addresses:  Goal 1.1: To increase self-esteem  - Group will improve mood through participation during Recreation Therapy tx.  - Group will identify at least one positive affirmation by the end of therapy session.   - Group will identify the importance of self-esteem    Behavioral Response: Passively engaged    Intervention: Art   Activity: Patients were to choose a positive affirmation template. Patents were then given the opportunity to design their poster using the colored pencils and markers provided. Afterwards, Patients shared their positive affirmation to the group and explained how it can help improve their self-esteem.   Education: Self-Esteem   Education Outcome: Acknowledges Education  Clinical Observations/Feedback: Patient attended and was passively engaged during Recreation Therapy group treatment. Patient provided minimum feedback. Patient was not able to identify how the positive affirmation he chose will help improve his self-esteem   Sheryle HailDarian Nikesh Teschner, Recreation Therapy Intern  Sheryle HailDarian Hevin Jeffcoat 07/03/2017 11:07 AM

## 2017-07-03 NOTE — Plan of Care (Signed)
Patient is safe and free from injury.  Routine safety checks maintained every 15 minutes.  Denies suicidal thoughts, auditory and visual hallucinations.  Visible in milieu for therapy and activities.

## 2017-07-03 NOTE — Progress Notes (Signed)
[facility] MD Progress Note  07/03/2017 12:11 PM Garrett [name]  MRN:  [id number] Subjective:    Garrett [name] (Prounounced "Tee See") is a 32 y/o M originally from [place] (primary language is [name]) with previous psychiatric history of MDD with psychotic features who presented as a voluntary walk in to [facility] accompanied by his pastor with worsening symptoms of psychosis and disorganization. Pt was responding to internal stimuli and he reported increased depression and anxiety in context of losing his job on 05/24/17. In ED pt was guarded with thought blocking, but he was agreeable to inpatient hospitalization. He was started on low dose of risperdal and transferred to [facility], and he dose was titrated up during his stay.Pt was started on scheduled ativan due to symptoms of catatonia and poor intake, and he had some improvement of his presenting symptoms.RN staff have noted that pt has been participating in groups (to the best of his ability) and his oral intake of food/hydration has improved during his stay. Ativan wasgraduallyreduced and then discontinued; however, pt had worsening of catatonic features so ativan was restarted and pt had improvement of his catatonic symptoms.  Today upon interview, pt was evaluated with assistance of [name] Interpreter ([name]). Pt was asked if he has any concerns today, and he replies, "I'm just a little sleepy during the day." He denies other physical complaints. He is eating well. He denies SI/HI/AH/VH. He has been able to participate in groups. He feels that his medications have been helpful, and he is in agreement to continue his current regimen without changes. He plans to return to staying with friends at time of discharge, but he has not finalized this plan with them yet, and pt was encouraged to make contact with the friends whom he plans to stay with after discharge. Pt was in agreement with the above plan, and he had no further questions, comments, or concerns.  Principal  Problem: MDD (major depressive disorder), recurrent, severe, with psychosis (HCC) Diagnosis:   Patient Active Problem List   Diagnosis Date Noted  . Major depressive disorder, recurrent severe without psychotic features (HCC) [F33.2] 06/20/2017  . MDD (major depressive disorder), recurrent, severe, with psychosis (HCC) [F33.3] 06/19/2017   Total Time spent with patient: 30 minutes  Past Psychiatric History: see H&P  Past Medical History:  Past Medical History:  Diagnosis Date  . Depression    History reviewed. No pertinent surgical history. Family History: History reviewed. No pertinent family history. Family Psychiatric  History: see H&P Social History:  Social History   Substance and Sexual Activity  Alcohol Use Not on file     Social History   Substance and Sexual Activity  Drug Use Not on file    Social History   Socioeconomic History  . Marital status: Single    Spouse name: Not on file  . Number of children: Not on file  . Years of education: Not on file  . Highest education level: Not on file  Occupational History  . Not on file  Social Needs  . Financial resource strain: Not on file  . Food insecurity:    Worry: Not on file    Inability: Not on file  . Transportation needs:    Medical: Not on file    Non-medical: Not on file  Tobacco Use  . Smoking status: Never Smoker  . Smokeless tobacco: Never Used  Substance and Sexual Activity  . Alcohol use: Not on file  . Drug use: Not on file  .  Sexual activity: Not on file  Lifestyle  . Physical activity:    Days per week: Not on file    Minutes per session: Not on file  . Stress: Not on file  Relationships  . Social connections:    Talks on phone: Not on file    Gets together: Not on file    Attends religious service: Not on file    Active member of club or organization: Not on file    Attends meetings of clubs or organizations: Not on file    Relationship status: Not on file  Other Topics Concern   . Not on file  Social History Narrative  . Not on file   Additional Social History:                         Sleep: Good  Appetite:  Good  Current Medications: Current Facility-Administered Medications  Medication Dose Route Frequency Provider Last Rate Last Dose  . alum & mag hydroxide-simeth (MAALOX/MYLANTA) 200-200-20 MG/5ML suspension 30 mL  30 mL Oral Q4H PRN Charm RingsLord, Jamison Y, NP      . escitalopram (LEXAPRO) tablet 10 mg  10 mg Oral Daily Oneta RackLewis, Tanika N, NP   10 mg at 07/03/17 [id number]  . feeding supplement (ENSURE ENLIVE) (ENSURE ENLIVE) liquid 237 mL  237 mL Oral BID BM Micheal Likensainville, Bobbie Valletta T, MD   237 mL at 07/03/17 0923  . ibuprofen (ADVIL,MOTRIN) tablet 600 mg  600 mg Oral BID Oneta RackLewis, Tanika N, NP   600 mg at 07/03/17 [id number]  . LORazepam (ATIVAN) tablet 0.5 mg  0.5 mg Oral BID Micheal Likensainville, Colen Eltzroth T, MD   0.5 mg at 07/03/17 0923  . magnesium hydroxide (MILK OF MAGNESIA) suspension 30 mL  30 mL Oral Daily PRN Charm RingsLord, Jamison Y, NP      . multivitamin with minerals tablet 1 tablet  1 tablet Oral Daily Micheal Likens[place], Carri Spillers T, MD   1 tablet at 07/03/17 [phone]  . risperiDONE (RISPERDAL) tablet 2 mg  2 mg Oral BID Micheal Likens[place]ville, Maylie Ashton T, MD   2 mg at 07/03/17 [id number]  . traZODone (DESYREL) tablet 100 mg  100 mg Oral QHS PRN,MR X 1 Nira ConnBerry, Jason A, NP   100 mg at 07/02/17 2138    Lab Results: No results found for this or any previous visit (from the past 48 hour(s)).  Blood Alcohol level:  Lab Results  Component Value Date   ETH <10 06/18/2017   ETH  12/11/2008    <5        LOWEST DETECTABLE LIMIT FOR SERUM ALCOHOL IS 5 mg/dL FOR MEDICAL PURPOSES ONLY    Metabolic Disorder Labs: Lab Results  Component Value Date   HGBA1C 5.0 06/22/2017   MPG 96.8 06/22/2017   Lab Results  Component Value Date   PROLACTIN 32.9 (H) 06/22/2017   Lab Results  Component Value Date   CHOL 199 06/22/2017   TRIG 107 06/22/2017   HDL 43 06/22/2017   CHOLHDL 4.6 06/22/2017   VLDL  21 06/22/2017   LDLCALC 135 (H) 06/22/2017    Physical Findings: AIMS: Facial and Oral Movements Muscles of Facial Expression: None, normal Lips and Perioral Area: None, normal Jaw: None, normal Tongue: None, normal,Extremity Movements Upper (arms, wrists, hands, fingers): None, normal Lower (legs, knees, ankles, toes): None, normal, Trunk Movements Neck, shoulders, hips: None, normal, Overall Severity Severity of abnormal movements (highest score from questions above): None, normal Incapacitation due to abnormal movements: None, normal Patient's  awareness of abnormal movements (rate only patient's report): No Awareness, Dental Status Current problems with teeth and/or dentures?: No Does patient usually wear dentures?: No  CIWA:  CIWA-Ar Total: 1 COWS:  COWS Total Score: 2  Musculoskeletal: Strength & Muscle Tone: within normal limits Gait & Station: normal Patient leans: N/A  Psychiatric Specialty Exam: Physical Exam  Nursing note and vitals reviewed.   Review of Systems  Constitutional: Positive for malaise/fatigue.  Respiratory: Negative for cough.   Cardiovascular: Negative for chest pain.  Gastrointestinal: Negative for heartburn and nausea.  Psychiatric/Behavioral: Negative for depression, hallucinations and suicidal ideas. The patient is not nervous/anxious.     Blood pressure (!) 101/53, pulse (!) 115, temperature 98.2 F (36.8 C), temperature source Oral, resp. rate 16, height 5\' 2"  (1.575 m), weight 53.1 kg (117 lb), SpO2 98 %.Body mass index is 21.4 kg/m.  General Appearance: Casual and Fairly Groomed  Eye Contact:  Good  Speech:  Clear and Coherent and Normal Rate  Volume:  Normal  Mood:  Euthymic  Affect:  Flat  Thought Process:  Coherent and Goal Directed  Orientation:  Full (Time, Place, and Person)  Thought Content:  Logical  Suicidal Thoughts:  No  Homicidal Thoughts:  No  Memory:  Immediate;   Fair Recent;   Fair Remote;   Fair  Judgement:  Fair   Insight:  Fair  Psychomotor Activity:  Normal  Concentration:  Concentration: Fair  Recall:  [employer] of Knowledge:  Fair  Language:  Fair  Akathisia:  No  Handed:    AIMS (if indicated):     Assets:  Communication Skills Resilience Social Support  ADL's:  Intact  Cognition:  WNL  Sleep:  Number of Hours: 6   Treatment Plan Summary: Daily contact with patient to assess and evaluate symptoms and progress in treatment and Medication management   -Continue inpatient hospitalization  - MDD with psychotic features - Continue lexapro10mg  po qDay - Continue Risperdal 2mg  po BID  - Catatonic features - Continue ativan 0.5mg  po BID  - Poor nutritional intake - Continue Ensure BID between meals  - Insomnia -Continuetrazodone 100mg  po qhs prn insomnia (may repeat x1)  -Encourage participation in groups and therapeutic milieu  -Disposition planning will be ongoing    Micheal Likens, MD 07/03/2017, 12:11 PM

## 2017-07-03 NOTE — [facility] Group Notes (Signed)
LCSW Group Therapy Note  07/03/2017 1:15pm  Type of Therapy/Topic:  Group Therapy:  Feelings about Diagnosis  Participation Level:  None   Description of Group:   This group will allow patients to explore their thoughts and feelings about diagnoses they have received. Patients will be guided to explore their level of understanding and acceptance of these diagnoses. Facilitator will encourage patients to process their thoughts and feelings about the reactions of others to their diagnosis and will guide patients in identifying ways to discuss their diagnosis with significant others in their lives. This group will be process-oriented, with patients participating in exploration of their own experiences, giving and receiving support, and processing challenge from other group members.   Therapeutic Goals: 1. Patient will demonstrate understanding of diagnosis as evidenced by identifying two or more symptoms of the disorder 2. Patient will be able to express two feelings regarding the diagnosis 3. Patient will demonstrate their ability to communicate their needs through discussion and/or role play  Summary of Patient Progress:   Stayed the entire time, engaged throughout.  Answered "I don't know" to all questions.    Therapeutic Modalities:   Cognitive Behavioral Therapy Brief Therapy Feelings Identification    Garrett [name] B Kasai Beltran, LCSW 07/03/2017 12:17 PM

## 2017-07-04 NOTE — [facility] Group Notes (Addendum)
LCSW Group Therapy Note 07/04/2017 1:15pm  Type of Therapy and Topic:  Group Therapy:  Setting Goals  Participation Level:  Minimal  Description of Group: In this process group, patients discussed using strengths to work toward goals and address challenges.  Patients identified two positive things about themselves and one goal they were working on.  Patients were given the opportunity to share openly and support each other's plan for self-empowerment.  The group discussed the value of gratitude and were encouraged to have a daily reflection of positive characteristics or circumstances.  Patients were encouraged to identify a plan to utilize their strengths to work on current challenges and goals.  Therapeutic Goals 1. Patient will verbalize personal strengths/positive qualities and relate how these can assist with achieving desired personal goals 2. Patients will verbalize affirmation of peers plans for personal change and goal setting 3. Patients will explore the value of gratitude and positive focus as related to successful achievement of goals 4. Patients will verbalize a plan for regular reinforcement of personal positive qualities and circumstances.  Summary of Patient Progress:   My goal is to take my medications every day."  Initially unable to identify any steps that will help him towards this goal. With some prompting, said he would ask his roomates to remind him.  If he messes up, stated he would get back on track the next day.    Therapeutic Modalities Cognitive Behavioral Therapy Motivational Interviewing    Ida [name] B Michelina [place], [place] 07/04/2017 1:12 PM

## 2017-07-04 NOTE — Progress Notes (Signed)
Adult Psychoeducational Group Note  Date:  07/04/2017 Time:  8:46 PM  Group Topic/Focus:  Wrap-Up Group:   The focus of this group is to help patients review their daily goal of treatment and discuss progress on daily workbooks.  Participation Level:  Active  Participation Quality:  Appropriate  Affect:  Appropriate  Cognitive:  Appropriate  Insight: Appropriate  Engagement in Group:  Engaged  Modes of Intervention:  Discussion  Additional Comments: The patient expressed that he attended groups.the patient also said that he rates today a 9 and is looking forward to discharge.  [name], Love Milbourne Lee 07/04/2017, 8:46 PM

## 2017-07-04 NOTE — Progress Notes (Signed)
Interpreter was present for the duration of this assessment.    DATA ACTION RESPONSE  Objective- Pt. is visible in the dayroom, seen eating  snack.Presents with an anxious/paranoid affect and mood. Pt states he is going home tomorrow. Pt states he will stay with a friend. No new c/o's.  Subjective- Denies having any SI/HI/AVH/Pain at this time. Is cooperative and remains safe on the unit.  1:1 interaction in private to establish rapport. Encouragement, education, & support given from staff.  PRN trazodone requested and will re-eval accordingly.   Safety maintained with Q 15 checks. Continue with POC.

## 2017-07-04 NOTE — Progress Notes (Signed)
DAR NOTE: Patient presents with anxious affect and depressed mood.  Pt continue to be isolative partly due to language barrier. Through interpretor, pt encouraged to voiced his concerns and ask for help if needed. Denies pain, auditory and visual hallucinations.  Rates depression at 9, hopelessness at 0, and anxiety at 0.  Maintained on routine safety checks.  Medications given as prescribed.  Support and encouragement offered as needed.  Attended group and participated.  States goal for today is " Go home." Will continue to monitor.

## 2017-07-04 NOTE — Progress Notes (Signed)
Recreation Therapy Notes  Date: 4.3.19 Time: 10:00 am Location: [address] Dayroom   Group Topic: Coping Skills   Goal Area(s) Addresses:  Goal 1.1: To improve coping skills  . Group will answer at least five questions on coping skills  . Group will increase awareness on coping skills  . Group will be able to identify how coping skills can improve their wellness  Behavioral Response: Appropriate   Intervention: Game   Activity: Coping Skills Jeopardy Board- Patients had the opportunity to play jeopardy using darts to pop the balloons to answer specific questions based on the category they chose. The patient who had the most points at the end won.   Education: Radiographer, therapeutic, Self-Esteem   Education Outcome: Acknowledges Education  Clinical Observations/Feedback: Patient attended and participated appropriately during Recreation Therapy group treatment. Patient actively engaged in group activity, successfully answering at least five questions on coping skills with the help of interpreter.  Patient met Goal 1.1 (see above).   Ranell Patrick, Recreation Therapy Intern   Ranell Patrick 07/04/2017 11:43 AM

## 2017-07-04 NOTE — Progress Notes (Signed)
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 Pt was responding to internal stimuli and he reported increased depression and anxiety in context of losing his job on 05/24/17. In ED pt was guarded with thought blocking, but he was agreeable to inpatient hospitalization. He was started on low dose of risperdal [facility] and he dose was titrated up during his stay.Pt was started on scheduled ativan due to symptoms of catatonia and poor intake, and he had some improvement of his presenting symptoms.RN staff have noted that pt has been participating in groups (to the best of his ability) and his oral intake of food/hydration has improved during his stay. Ativan wasgraduallyreduced and then discontinued; however, pt had worsening of catatonic features so ativan was restartedand pt had improvement of his catatonic symptoms and gradual improvement of his presenting psychotic and mood symptoms.  Today upon interview, pt was evaluated with assistance of Clydie BraunKaren Interpreter ([name]).Pt was asked if he has any concerns today, and he replies, "I'm doing good, no problems." He is sleeping well. His appetite is good. He notes some anxiety about not knowing where he will stay after discharge, but SW team has been in contact with patient's pastor whom indicated that pt has multiple options for people to stay with after discharge. Pt denies SI/HI/AH/VH. He is in agreement to continue his current treatment regimen without changes. He is in agreement with tentative plan for discharge to outpatient level of care tomorrow. He had no further questions, comments, or concerns.    Principal Problem: MDD (major depressive disorder), recurrent, severe, with psychosis (HCC) Diagnosis:   Patient Active Problem List   Diagnosis Date Noted  . Major depressive disorder, recurrent severe without psychotic features (HCC) [F33.2] 06/20/2017  . MDD (major depressive disorder), recurrent, severe, with psychosis (HCC) [F33.3] 06/19/2017   Total Time spent with patient: 30 minutes  Past Psychiatric History: [name] H&P  Past Medical History:  Past Medical History:  Diagnosis Date  . Depression    History reviewed. No pertinent surgical history. Family History: History reviewed. No pertinent family history. Family Psychiatric  History: [name] H&P Social History:  Social History   Substance and Sexual Activity  Alcohol Use Not on file     Social History   Substance and Sexual Activity  Drug Use Not on file    Social History   Socioeconomic History  . Marital status: Single    Spouse name: Not on file  . Number of children: Not on file  . Years of education: Not on file  . Highest education level: Not on file  Occupational History  . Not on file  Social Needs  . Financial resource strain: Not on file  . Food insecurity:    Worry: Not on file    Inability: Not on file  . Transportation needs:    Medical: Not on file    Non-medical: Not on file  Tobacco Use  . Smoking status: Never Smoker  . Smokeless tobacco: Never Used  Substance and Sexual Activity  . Alcohol use: Not on file  . Drug use: Not on file  . Sexual activity: Not on file  Lifestyle  .  Physical activity:    Days per week: Not on file    Minutes per session: Not on file  . Stress: Not on file  Relationships  . Social connections:    Talks on phone: Not on file    Gets together: Not on file    Attends religious service: Not on file    Active member of club or organization: Not on file    Attends meetings of clubs or organizations: Not on file    Relationship status: Not on file  Other  Topics Concern  . Not on file  Social History Narrative  . Not on file   Additional Social History:                         Sleep: Good  Appetite:  Good  Current Medications: Current Facility-Administered Medications  Medication Dose Route Frequency Provider Last Rate Last Dose  . alum & mag hydroxide-simeth (MAALOX/MYLANTA) 200-200-20 MG/5ML suspension 30 mL  30 mL Oral Q4H PRN Charm Rings, NP      . escitalopram (LEXAPRO) tablet 10 mg  10 mg Oral Daily Oneta Rack, NP   10 mg at 07/04/17 0818  . feeding supplement (ENSURE ENLIVE) (ENSURE ENLIVE) liquid 237 mL  237 mL Oral BID BM Micheal Likens, MD   237 mL at 07/04/17 0819  . ibuprofen (ADVIL,MOTRIN) tablet 600 mg  600 mg Oral BID Oneta Rack, NP   600 mg at 07/04/17 0818  . LORazepam (ATIVAN) tablet 0.5 mg  0.5 mg Oral BID Micheal Likens, MD   0.5 mg at 07/04/17 0818  . magnesium hydroxide (MILK OF MAGNESIA) suspension 30 mL  30 mL Oral Daily PRN Charm Rings, NP      . multivitamin with minerals tablet 1 tablet  1 tablet Oral Daily Micheal Likens, MD   1 tablet at 07/04/17 0818  . risperiDONE (RISPERDAL) tablet 2 mg  2 mg Oral BID Micheal Likens, MD   2 mg at 07/04/17 0818  . traZODone (DESYREL) tablet 100 mg  100 mg Oral QHS PRN,MR X 1 Nira Conn A, NP   100 mg at 07/03/17 2108    Lab Results: No results found for this or any previous visit (from the past 48 hour(s)).  Blood Alcohol level:  Lab Results  Component Value Date   ETH <10 06/18/2017   ETH  12/11/2008    <5        LOWEST DETECTABLE LIMIT FOR SERUM ALCOHOL IS 5 mg/dL FOR MEDICAL PURPOSES ONLY    Metabolic Disorder Labs: Lab Results  Component Value Date   HGBA1C 5.0 06/22/2017   MPG 96.8 06/22/2017   Lab Results  Component Value Date   PROLACTIN 32.9 (H) 06/22/2017   Lab Results  Component Value Date   CHOL 199 06/22/2017   TRIG 107 06/22/2017   HDL 43 06/22/2017   CHOLHDL 4.6  06/22/2017   VLDL 21 06/22/2017   LDLCALC 135 (H) 06/22/2017    Physical Findings: AIMS: Facial and Oral Movements Muscles of Facial Expression: None, normal Lips and Perioral Area: None, normal Jaw: None, normal Tongue: None, normal,Extremity Movements Upper (arms, wrists, hands, fingers): None, normal Lower (legs, knees, ankles, toes): None, normal, Trunk Movements Neck, shoulders, hips: None, normal, Overall Severity Severity of abnormal movements (highest score from questions above): None, normal Incapacitation due to abnormal movements: None, normal Patient's awareness of abnormal movements (rate only patient's report): No  Awareness, Dental Status Current problems with teeth and/or dentures?: No Does patient usually wear dentures?: No  CIWA:  CIWA-Ar Total: 1 COWS:  COWS Total Score: 2  Musculoskeletal: Strength & Muscle Tone: within normal limits Gait & Station: normal Patient leans: N/A  Psychiatric Specialty Exam: Physical Exam  Nursing note and vitals reviewed.   Review of Systems  Constitutional: Negative for chills and fever.  Respiratory: Negative for cough and shortness of breath.   Cardiovascular: Negative for chest pain.  Gastrointestinal: Negative for abdominal pain, heartburn, nausea and vomiting.  Psychiatric/Behavioral: Negative for depression, hallucinations and suicidal ideas. The patient is not nervous/anxious.     Blood pressure (!) 118/57, pulse (!) 117, temperature 98 F (36.7 C), temperature source Oral, resp. rate 18, height 5\' 2"  (1.575 m), weight 53.1 kg (117 lb), SpO2 98 %.Body mass index is 21.4 kg/m.  General Appearance: Casual and Fairly Groomed  Eye Contact:  Good  Speech:  Clear and Coherent and Normal Rate  Volume:  Normal  Mood:  Euthymic  Affect:  Appropriate, Congruent and Constricted  Thought Process:  Coherent and Goal Directed  Orientation:  Full (Time, Place, and Person)  Thought Content:  Logical  Suicidal Thoughts:  No   Homicidal Thoughts:  No  Memory:  Immediate;   Fair Recent;   Fair Remote;   Fair  Judgement:  Fair  Insight:  Fair  Psychomotor Activity:  Normal  Concentration:  Concentration: Fair  Recall:  Good  Fund of Knowledge:  Fair  Language:  Fair  Akathisia:  No  Handed:    AIMS (if indicated):     Assets:  Communication Skills Resilience Social Support  ADL's:  Intact  Cognition:  WNL  Sleep:  Number of Hours: 6.75    Treatment Plan Summary: Daily contact with patient to assess and evaluate symptoms and progress in treatment and Medication management   -Continue inpatient hospitalization  - MDD with psychotic features - Continue lexapro10mg  po qDay - Continue Risperdal 2mg  po BID  - Catatonic features - Continue ativan 0.5mg  po BID  - Poor nutritional intake - Continue Ensure BID between meals  - Insomnia -Continuetrazodone 100mg  po qhs prn insomnia (may repeat x1)  -Encourage participation in groups and therapeutic milieu  -Disposition planning will be ongoing   Micheal Likens, MD 07/04/2017, 12:10 PM

## 2017-07-05 MED ORDER — LORAZEPAM 0.5 MG PO TABS: 0.5000 mg | ORAL_TABLET | Freq: Two times a day (BID) | ORAL | 0 refills | Status: DC

## 2017-07-05 MED ORDER — ESCITALOPRAM OXALATE 10 MG PO TABS: 10.0000 mg | ORAL_TABLET | Freq: Every day | ORAL | 0 refills | Status: DC

## 2017-07-05 MED ORDER — RISPERIDONE 2 MG PO TABS: 2.0000 mg | ORAL_TABLET | Freq: Two times a day (BID) | ORAL | 0 refills | Status: DC

## 2017-07-05 MED ORDER — TRAZODONE HCL 100 MG PO TABS
100.0000 mg | ORAL_TABLET | Freq: Every evening | ORAL | Status: DC | PRN
  Filled 2017-07-05: qty 7

## 2017-07-05 MED ORDER — TRAZODONE HCL 100 MG PO TABS: 100.0000 mg | ORAL_TABLET | Freq: Every evening | ORAL | 0 refills | Status: DC | PRN

## 2017-07-05 NOTE — Progress Notes (Signed)
Patient discharged to lobby. Patient was stable and appreciative at that time. All papers and prescriptions were given and valuables returned. Verbal understanding expressed. Denies SI/HI and A/VH. Patient given opportunity to express concerns and ask questions.  

## 2017-07-05 NOTE — Tx Team (Signed)
Interdisciplinary Treatment and Diagnostic Plan Update  07/05/2017 Time of Session: 8:56 AM  Garrett [name] MRN: [id number]  Principal Diagnosis: MDD (major depressive disorder), recurrent, severe, with psychosis ([place])  Secondary Diagnoses: Principal Problem:   MDD (major depressive disorder), recurrent, severe, with psychosis ([place])   Current Medications:  Current Facility-Administered Medications  Medication Dose Route Frequency Provider Last Rate Last Dose  . alum & mag hydroxide-simeth (MAALOX/MYLANTA) 200-200-20 MG/5ML suspension 30 mL  30 mL Oral Q4H PRN Patrecia Pour, NP      . escitalopram (LEXAPRO) tablet 10 mg  10 mg Oral Daily Derrill Center, NP   10 mg at 07/04/17 0818  . feeding supplement (ENSURE ENLIVE) (ENSURE ENLIVE) liquid 237 mL  237 mL Oral BID BM Pennelope Bracken, MD   237 mL at 07/04/17 1335  . ibuprofen (ADVIL,MOTRIN) tablet 600 mg  600 mg Oral BID Derrill Center, NP   600 mg at 07/04/17 1818  . LORazepam (ATIVAN) tablet 0.5 mg  0.5 mg Oral BID Pennelope Bracken, MD   0.5 mg at 07/04/17 1819  . magnesium hydroxide (MILK OF MAGNESIA) suspension 30 mL  30 mL Oral Daily PRN Patrecia Pour, NP      . multivitamin with minerals tablet 1 tablet  1 tablet Oral Daily Pennelope Bracken, MD   1 tablet at 07/04/17 0818  . risperiDONE (RISPERDAL) tablet 2 mg  2 mg Oral BID Pennelope Bracken, MD   2 mg at 07/04/17 2054  . traZODone (DESYREL) tablet 100 mg  100 mg Oral QHS PRN,MR X 1 Lindon Romp A, NP   100 mg at 07/04/17 2054    PTA Medications: No medications prior to admission.    Patient Stressors: Occupational concerns  Patient Strengths: Capable of independent living Motivation for treatment/growth Supportive family/friends  Treatment Modalities: Medication Management, Group therapy, Case management,  1 to 1 session with clinician, Psychoeducation, Recreational therapy.   Physician Treatment Plan for Primary Diagnosis: MDD (major  depressive disorder), recurrent, severe, with psychosis ([place]) Long Term Goal(s): Improvement in symptoms so as ready for discharge  Short Term Goals: Ability to identify changes in lifestyle to reduce recurrence of condition will improve Ability to identify and develop effective coping behaviors will improve  Medication Management: Evaluate patient's response, side effects, and tolerance of medication regimen.  Therapeutic Interventions: 1 to 1 sessions, Unit Group sessions and Medication administration.  Evaluation of Outcomes: Adequate for Discharge  Physician Treatment Plan for Secondary Diagnosis: Principal Problem:   MDD (major depressive disorder), recurrent, severe, with psychosis ([place])   Long Term Goal(s): Improvement in symptoms so as ready for discharge  Short Term Goals: Ability to identify changes in lifestyle to reduce recurrence of condition will improve Ability to identify and develop effective coping behaviors will improve  Medication Management: Evaluate patient's response, side effects, and tolerance of medication regimen.  Therapeutic Interventions: 1 to 1 sessions, Unit Group sessions and Medication administration.  Evaluation of Outcomes: Adequate for Discharge   3/27: Pt was encouraged to improve his oral intake of food and water, and he nodded in understanding. Discussed with patient that we will restart previous medication of ativan to address his worsening catatonic features   - MDD with psychotic features - Continue lexapro17m po qDay - Continuerisperdal 11mpo qAM + 18m56mo qhs  - Catatonic features - Restartativan 0.5mg11m TID  4/1: Patient has concerns with generalized body aches and pains attributed to resting in bed.     -  MDD with psychotic features - Continue lexapro64m po qDay - Continuerisperdal 136mpo qAM + 21m44mo qhs  - Catatonic features - Continueativan  0.5mg10m TID     RN Treatment Plan for Primary Diagnosis: MDD (major depressive disorder), recurrent, severe, with psychosis (HCC)[place] Term Goal(s): Knowledge of disease and therapeutic regimen to maintain health will improve  Short Term Goals: Ability to identify and develop effective coping behaviors will improve and Compliance with prescribed medications will improve  Medication Management: RN will administer medications as ordered by provider, will assess and evaluate patient's response and provide education to patient for prescribed medication. RN will report any adverse and/or side effects to prescribing provider.  Therapeutic Interventions: 1 on 1 counseling sessions, Psychoeducation, Medication administration, Evaluate responses to treatment, Monitor vital signs and CBGs as ordered, Perform/monitor CIWA, COWS, AIMS and Fall Risk screenings as ordered, Perform wound care treatments as ordered.  Evaluation of Outcomes: Adequate for Discharge   LCSW Treatment Plan for Primary Diagnosis: MDD (major depressive disorder), recurrent, severe, with psychosis (HCC)[place] Term Goal(s): Safe transition to appropriate next level of care at discharge, Engage patient in therapeutic group addressing interpersonal concerns.  Short Term Goals: Engage patient in aftercare planning with referrals and resources  Therapeutic Interventions: Assess for all discharge needs, 1 to 1 time with Social worker, Explore available resources and support systems, Assess for adequacy in community support network, Educate family and significant other(s) on suicide prevention, Complete Psychosocial Assessment, Interpersonal group therapy.  Evaluation of Outcomes: Met  [facility] home[facility] Attending groups: Yes Participating in groups:Minimally Taking medication as prescribed: Yes Toleration medication: Yes, no side effects reported at this time Family/Significant other  contact made: Yes-pastor Patient understands diagnosis: No  Limited insight Discussing patient identified problems/goals with staff: Yes Medical problems stabilized or resolved: Yes Denies suicidal/homicidal ideation: Yes Issues/concerns per patient self-inventory: None Other: N/A  New problem(s) identified: None identified at this time.   New Short Term/Long Term Goal(s): "I want to get better, and take care of my health."  Discharge Plan or Barriers:   Reason for Continuation of Hospitalizatio   Estimated Length of Stay: D/C today  Attendees: Patient:  07/05/2017  8:56 AM  Physician: ChriMaris Berger 07/05/2017  8:56 AM  Nursing: ElizReal Cons4/07/2017  8:56 AM  RN Care Manager: JennLars Pinks 07/05/2017  8:56 AM  Social Worker: Rod Ripley Fraise/2019  8:56 AM  Recreational Therapist: MarjWinfield Cunas/2019  8:56 AM  Other: DeloNorberto Sorenson/2019  8:56 AM  Other:  07/05/2017  8:56 AM    Scribe for Treatment Team:  RodnRoque LiasW 07/05/2017 8:56 AM

## 2017-07-05 NOTE — [facility] Suicide Risk Assessment (Signed)
[facility] Discharge Suicide Risk Assessment   Principal Problem: MDD (major depressive disorder), recurrent, severe, with psychosis (HCC) Discharge Diagnoses:  Patient Active Problem List   Diagnosis Date Noted  . Major depressive disorder, recurrent severe without psychotic features (HCC) [F33.2] 06/20/2017  . MDD (major depressive disorder), recurrent, severe, with psychosis (HCC) [F33.3] 06/19/2017    Total Time spent with patient: 30 minutes  Musculoskeletal: Strength & Muscle Tone: within normal limits Gait & Station: normal Patient leans: N/A  Psychiatric Specialty Exam: Review of Systems  Constitutional: Negative for chills and fever.  Respiratory: Negative for cough and shortness of breath.   Cardiovascular: Negative for chest pain.  Gastrointestinal: Negative for abdominal pain, heartburn, nausea and vomiting.  Psychiatric/Behavioral: Negative for depression, hallucinations and suicidal ideas. The patient is not nervous/anxious.     Blood pressure (!) 118/57, pulse (!) 117, temperature 98 F (36.7 C), temperature source Oral, resp. rate 18, height 5\' 2"  (1.575 m), weight 53.1 kg (117 lb), SpO2 98 %.Body mass index is 21.4 kg/m.  General Appearance: Casual and Fairly Groomed  Patent attorney::  Good  Speech:  Clear and Coherent and Normal Rate  Volume:  Normal  Mood:  Euthymic  Affect:  Appropriate, Congruent and Constricted  Thought Process:  Coherent and Goal Directed  Orientation:  Full (Time, Place, and Person)  Thought Content:  Logical  Suicidal Thoughts:  No  Homicidal Thoughts:  No  Memory:  Immediate;   Fair Recent;   Fair Remote;   Fair  Judgement:  Fair  Insight:  Fair  Psychomotor Activity:  Normal  Concentration:  Fair  Recall:  [employer] of Knowledge:Fair  Language: Fair  Akathisia:  No  Handed:    AIMS (if indicated):     Assets:  Communication Skills Resilience Social Support  Sleep:  Number of Hours: 6.75  Cognition: WNL  ADL's:  Intact    Mental Status Per Nursing Assessment::   On Admission:     Demographic Factors:  Male, Low socioeconomic status and Unemployed  Loss Factors: Financial problems/change in socioeconomic status  Historical Factors: NA  Risk Reduction Factors:   Living with another person, especially a relative, Positive social support, Positive therapeutic relationship and Positive coping skills or problem solving skills  Continued Clinical Symptoms:  Depression:   Severe  Cognitive Features That Contribute To Risk:  None    Suicide Risk:  Minimal: No identifiable suicidal ideation.  Patients presenting with no risk factors but with morbid ruminations; may be classified as minimal risk based on the severity of the depressive symptoms  Follow-up Information    [facility] Follow up on 07/06/2017.   Why:  April 5th, 2019 at [facility] with Garrett [name] for registration and Garrett [name] for the assessment.  Bring along hospital d/c paperwork, social security card, prrof of any income, and your ID. Contact information: [address] [place] [address] [phone]         Subjective Data:  Garrett [name] (Prounounced "Rhae Hammock See") is a 32 y/o M originally from [place] (primary language is Garrett [name]) with previous psychiatric history of MDD with psychotic features who presented as a voluntary walk in to [facility] accompanied by his pastor with worsening symptoms of psychosis and disorganization. Pt was responding to internal stimuli and he reported increased depression and anxiety in context of losing his job on 05/24/17. In ED pt was guarded with thought blocking, but he was agreeable to inpatient hospitalization. He was started on low dose of risperdal and transferred to  [facility], and he dose was titrated up during his stay.Pt was started on scheduled ativan due to symptoms of catatonia and poor intake, and he had some improvement of his presenting symptoms.RN staff have noted that pt has been participating in groups (to the best of his  ability) and his oral intake of food/hydration has improved during his stay. Ativan wasgraduallyreduced and then discontinued; however, pt had worsening of catatonic features so ativan was restartedand pt had improvement of his catatonic symptoms and gradual improvement of his presenting psychotic and mood symptoms.  Today upon interview, pt was evaluated with assistance of Garrett BraunKaren Interpreter (Garrett [name]).Pt was asked if he has any concerns today, and he replies, "I'm good." He denies any specific concerns. He is sleeping well. His appetite is good. He denies SI/HI/AH/VH. He denies any side effects of his medications, and he is in agreement to continue his current regimen without changes. Discussed with patient that he will continue ativan outside the hospital for 1 week and then stop, and pt verbalized good understanding. [facility] He plans to be picked up by his pastor today and then work with his pastor in arranging a place to stay. He was able to engage in safety planning [facility] or contact emergency services if he feels unable to maintain his own safety or the safety of others. Pt had no further questions, comments, or concerns.   Plan Of Care/Follow-up recommendations:   -Discharge to outpatient level of care  - MDD with psychotic features - Continue lexapro10mg  po qDay - ContinueRisperdal 2mg  po BID  - Catatonic features - Continueativan 0.5mg  po BID  - Insomnia -Continuetrazodone 100mg  po qhs prn insomnia  Activity:  as tolerated Diet:  normal Tests:  NA Other:  see above for DC plan  Garrett Likenshristopher T Favour Aleshire, MD 07/05/2017, 8:33 AM

## 2017-07-05 NOTE — Progress Notes (Signed)
Recreation Therapy Notes  Date: 4.4.19 Time: 10:00 a.m Location: [address] Dayroom   Group Topic: Triggers   Goal Area(s) Addresses:  Goal 1.1: To identify triggers and coping skills  - Group will identify at least four triggers   - Group will identify at least eight coping skills   - Group will participate in Recreation Therapy group tx.   Intervention: Craft    Activity: Coping Strategies Fortune Teller: Patients received a Clinical biochemistCoping Strategies Fortune Teller. Patients had to identify four triggers and eight coping skills for anger. Patients shared their triggers and coping skills with peers    Education: Triggers, Coping Skills   Education Outcome: Acknowledges Education  Clinical Observations/Feedback: Patient did not attend   Sheryle HailDarian Tavien Chestnut, Recreation Therapy Intern   Sheryle HailDarian Laurren Lepkowski 07/05/2017 11:21 AM

## 2017-07-05 NOTE — Progress Notes (Signed)
  [facility] :  Will you be returning to the same living situation after discharge:  Yes,  home At discharge, do you have transportation home?: Yes,  Renato Gailsastor Do you have the ability to pay for your medications: Yes,  mental heralth  Release of information consent forms completed and in the chart;  Patient's signature needed at discharge.  Patient to Follow up at: Follow-up Information    [facility] Follow up on 07/06/2017.   Why:  April 5th, 2019 at [facility] with Tresa EndoKelly for registration and Tawanna Coolerodd for the assessment.  Bring along hospital d/c paperwork, social security card, prrof of any income, and your ID. Contact information: [address] [place] [id number] [phone]           Next level of care provider [facility] Link:no  Safety Planning and Suicide Prevention discussed: Yes,  yes  Have you used any form of tobacco in the last 30 days? (Cigarettes, Smokeless Tobacco, Cigars, and/or Pipes): No  Has patient been referred to the Quitline?: N/A patient is not a smoker  Patient has been referred for addiction treatment: N/A  Ida [name] B Lynnda Wiersma, LCSW 07/05/2017, 9:00 AM

## 2017-07-05 NOTE — Progress Notes (Signed)
Recreation Therapy Notes  INPATIENT RECREATION TR PLAN  Patient Details Name: Garrett [name] MRN: [id number] DOB: 1985/10/14 Today's Date: 07/05/2017  Rec Therapy Plan Is patient appropriate for Therapeutic Recreation?: Yes Treatment times per week: At least three  Estimated Length of Stay: 5-7 days  TR Treatment/Interventions: Group participation (Appropriate participation in Recreation Therapy group tx.)  Discharge Criteria Pt will be discharged from therapy if:: Discharged Treatment plan/goals/alternatives discussed and agreed upon by:: Patient/family  Discharge Summary Short term goals set: See Care Plan  Short term goals met: Adequate for discharge Progress toward goals comments: Groups attended Which groups?: Coping skills, Self-esteem, Healthy Support System, Decision Making Therapeutic equipment acquired: None  Reason patient discharged from therapy: Discharge from hospital Pt/family agrees with progress & goals achieved: Yes Date patient discharged from therapy: 07/05/17  Ranell Patrick, Recreation Therapy Intern   Ranell Patrick 07/05/2017, 1:33 PM

## 2017-07-05 NOTE — Discharge Summary (Addendum)
Physician Discharge Summary Note  Patient:  Garrett [name] is an 32 y.o., male MRN:  [id number] DOB:  1985-06-28 Patient phone:  [phone] (home)  Patient address:   [address] [place] [address] [place] [id number],  Total Time spent with patient: 20 minutes  Date of Admission:  06/20/2017 Date of Discharge: 07/05/17   Reason for Admission:  Worsening psychosis and disorganization  Principal Problem: MDD (major depressive disorder), recurrent, severe, with psychosis [facility](HCC) Discharge Diagnoses: Patient Active Problem List   Diagnosis Date Noted  . Major depressive disorder, recurrent severe without psychotic features (HCC) [F33.2] 06/20/2017  . MDD (major depressive disorder), recurrent, severe, with psychosis (HCC) [F33.3] 06/19/2017    Past Psychiatric History: - Previous dx of MDD with psychotic features vs psychotic disorder unspecified - Pt is unaware of previous inpatient stays, but as per chart review has one prior [facility] for similar presentation - He has no current outpatient provider - Denies history of suicide attempt  Past Medical History:  Past Medical History:  Diagnosis Date  . Depression    History reviewed. No pertinent surgical history. Family History: History reviewed. No pertinent family history. Family Psychiatric  History: pt denies family psychiatric history  Social History:  Social History   Substance and Sexual Activity  Alcohol Use Not on file     Social History   Substance and Sexual Activity  Drug Use Not on file    Social History   Socioeconomic History  . Marital status: Single    Spouse name: Not on file  . Number of children: Not on file  . Years of education: Not on file  . Highest education level: Not on file  Occupational History  . Not on file  Social Needs  . Financial resource strain: Not on file  . Food insecurity:    Worry: Not on file    Inability: Not on file  . Transportation needs:    Medical: Not on  file    Non-medical: Not on file  Tobacco Use  . Smoking status: Never Smoker  . Smokeless tobacco: Never Used  Substance and Sexual Activity  . Alcohol use: Not on file  . Drug use: Not on file  . Sexual activity: Not on file  Lifestyle  . Physical activity:    Days per week: Not on file    Minutes per session: Not on file  . Stress: Not on file  Relationships  . Social connections:    Talks on phone: Not on file    Gets together: Not on file    Attends religious service: Not on file    Active member of club or organization: Not on file    Attends meetings of clubs or organizations: Not on file    Relationship status: Not on file  Other Topics Concern  . Not on file  Social History Narrative  . Not on file    Hospital Course:   [facility] Assessment: Garrett [name] (Prounounced "Tee See") is a 32 y/o M originally from [place] (primary language is [place]) with previous psychiatric history of MDD with psychotic features who presented as a voluntary walk in to [facility] accompanied by his pastor with worsening symptoms of psychosis and disorganization. Pt was responding to internal stimuli and he reported increased depression and anxiety in context of losing his job on 05/24/17. In ED pt was guarded with thought blocking, but he was agreeable to inpatient hospitalization. He was started on low dose  of risperdal and transferred to [facility]. Upon initial presentation, pt was interviewed with assistance of [name] [facility]). Pt provides vague responses and is guarded with his history despite multiple prompts. When asked why he came to the hospital, pt shares, "I'm sick." Pt was asked to provide details of what was bothering him and he replied, "I have head aches and I'm dizzy." Pt responds "I don't know" to majority of questions. He is able to note that he went to his pastor to ask for help, but he does not know what was bothering him at the time he went to his pastor. He denies SI/HI/AH/VH. He  denies depression and anxiety. He was asked directly about his job, and pt does not indicate that he lost his job, but rather suggests that he is still employed working as a Garment/textile technologist. He reports that he has been eating well and his sleep is normal. He denies symptoms of depression, mania, OCD, and PTSD. He endorses use of alcohol but he cannot quantify how much, and he denies all other illicit substance use. Discussed with patient about treatment options. He indicates he has not been taking any medications at home recently. Pt has previous history of admission to [facility] in 2010 with similar presentation at which time he was stabilized on haldol and celexa after a 20-day admission. In ED pt was started on low dose of risperdal and lexapro, and he is in agreement to continue those medications with increase in dose of risperdal. 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 He had no further questions, comments, or concerns.   Patient remained on the [facility] unit for 14 days. The patient stabilized on medication and therapy. Patient was discharged on Lexapro 10 mg Daily, Ativan 0.5 BID, Risperidone 2 mg BID, and Trazodone 100 mg QHS PRN. Patient improved gradually with medications. Patient has shown improvement with improved mood, affect, sleep, appetite, and interaction. Patient has attended group and participated. Patient has been seen in the day room interacting with peers and staff appropriately. Patient denies any SI/HI/AVH and contracts for safety. Patient agrees to [facility] up at [facility]. Patient is provided with prescriptions for their medications upon discharge.   Physical Findings: AIMS: Facial and Oral Movements Muscles of Facial Expression: None, normal Lips and Perioral Area: None, normal Jaw: None, normal Tongue:  None, normal,Extremity Movements Upper (arms, wrists, hands, fingers): None, normal Lower (legs, knees, ankles, toes): None, normal, Trunk Movements Neck, shoulders, hips: None, normal, Overall Severity Severity of abnormal movements (highest score from questions above): None, normal Incapacitation due to abnormal movements: None, normal Patient's awareness of abnormal movements (rate only patient's report): No Awareness, Dental Status Current problems with teeth and/or dentures?: No Does patient usually wear dentures?: No  CIWA:  CIWA-Ar Total: 1 COWS:  COWS Total Score: 2  Musculoskeletal: Strength & Muscle Tone: within normal limits Gait & Station: normal Patient leans: N/A  Psychiatric Specialty Exam: Physical Exam  Nursing note and vitals reviewed. Constitutional: He is oriented to person, place, and time. He appears well-developed and well-nourished.  Respiratory: Effort normal.  Musculoskeletal: Normal range of motion.  Neurological: He is alert and oriented to person, place, and time.  Skin: Skin is warm.    Review of Systems  Constitutional: Negative.   HENT: Negative.   Eyes: Negative.   Respiratory: Negative.  Cardiovascular: Negative.   Gastrointestinal: Negative.   Genitourinary: Negative.   Musculoskeletal: Negative.   Skin: Negative.   Neurological: Negative.   Endo/Heme/Allergies: Negative.   Psychiatric/Behavioral: Negative.     Blood pressure (!) 118/57, pulse (!) 117, temperature 98 F (36.7 C), temperature source Oral, resp. rate 18, height 5\' 2"  (1.575 m), weight 53.1 kg (117 lb), SpO2 98 %.Body mass index is 21.4 kg/m.  General Appearance: Casual  Eye Contact:  Good  Speech:  Clear and Coherent and Normal Rate  Volume:  Normal  Mood:  Euthymic  Affect:  Congruent  Thought Process:  Goal Directed and Descriptions of Associations: Intact  Orientation:  Full (Time, Place, and Person)  Thought Content:  WDL  Suicidal Thoughts:  No  Homicidal  Thoughts:  No  Memory:  Immediate;   Good Recent;   Good Remote;   Good  Judgement:  Fair  Insight:  Fair  Psychomotor Activity:  Normal  Concentration:  Concentration: Good and Attention Span: Good  Recall:  Good  Fund of Knowledge:  Good  Language:  Good  Akathisia:  No  Handed:  Right  AIMS (if indicated):     Assets:  Communication Skills Desire for Improvement Resilience Social Support  ADL's:  Intact  Cognition:  WNL  Sleep:  Number of Hours: 6.75     Have you used any form of tobacco in the last 30 days? (Cigarettes, Smokeless Tobacco, Cigars, and/or Pipes): No  Has this patient used any form of tobacco in the last 30 days? (Cigarettes, Smokeless Tobacco, Cigars, and/or Pipes) Yes, No  Blood Alcohol level:  Lab Results  Component Value Date   ETH <10 06/18/2017   ETH  12/11/2008    <5        LOWEST DETECTABLE LIMIT FOR SERUM ALCOHOL IS 5 mg/dL FOR MEDICAL PURPOSES ONLY    Metabolic Disorder Labs:  Lab Results  Component Value Date   HGBA1C 5.0 06/22/2017   MPG 96.8 06/22/2017   Lab Results  Component Value Date   PROLACTIN 32.9 (H) 06/22/2017   Lab Results  Component Value Date   CHOL 199 06/22/2017   TRIG 107 06/22/2017   HDL 43 06/22/2017   CHOLHDL 4.6 06/22/2017   VLDL 21 06/22/2017   LDLCALC 135 (H) 06/22/2017    See Psychiatric Specialty Exam and Suicide Risk Assessment completed by Attending Physician prior to discharge.  Discharge destination:  Home  Is patient on multiple antipsychotic therapies at discharge:  No   Has Patient had three or more failed trials of antipsychotic monotherapy by history:  No  Recommended Plan for Multiple Antipsychotic Therapies: NA   Allergies as of 07/05/2017   No Known Allergies     Medication List    TAKE these medications     Indication  escitalopram 10 MG tablet Commonly known as:  LEXAPRO Take 1 tablet (10 mg total) by mouth daily. For mood control  Indication:  mood stability   LORazepam  0.5 MG tablet Commonly known as:  ATIVAN Take 1 tablet (0.5 mg total) by mouth 2 (two) times daily. For anxiety  Indication:  Feeling Anxious   risperiDONE 2 MG tablet Commonly known as:  RISPERDAL Take 1 tablet (2 mg total) by mouth 2 (two) times daily. For mood control  Indication:  mood stability   traZODone 100 MG tablet Commonly known as:  DESYREL Take 1 tablet (100 mg total) by mouth at bedtime as needed for sleep.  Indication:  Trouble Sleeping  Follow-up Information    [facility] Follow up on 07/06/2017.   Why:  April 5th, 2019 at [facility] with Tresa Endo for registration and Tawanna Cooler for the assessment.  Bring along hospital d/c paperwork, social security card, prrof of any income, and your ID. Contact information: [address] [place] [address] [phone]           Follow-up recommendations:  Continue activity as tolerated. Continue diet as recommended by your PCP. Ensure to keep all appointments with outpatient providers.  Comments:  Patient is instructed prior to discharge to: Take all medications as prescribed by his/her mental healthcare provider. Report any adverse effects and or reactions from the medicines to his/her outpatient provider promptly. Patient has been instructed & cautioned: To not engage in alcohol and or illegal drug use while on prescription medicines. In the event of worsening symptoms, patient is instructed to call the crisis hotline, 911 and or go to the nearest ED for appropriate evaluation and treatment of symptoms. To follow-up with his/her primary care provider for your other medical issues, concerns and or health care needs.    Signed: Gerlene Burdock Money, FNP 07/05/2017, 9:05 AM   Patient seen, Suicide Assessment Completed.  Disposition Plan Reviewed

## 2017-07-05 NOTE — Plan of Care (Signed)
4.4.19 Patient attended and participated appropriately during Recreation Therapy group tx., engaging in groups with a calm and appropriate mood at least 2x within 5 Recreation Therapy group sessions

## 2019-06-13 ENCOUNTER — Ambulatory Visit: Payer: Self-pay | Admitting: Nurse Practitioner

## 2020-03-09 ENCOUNTER — Inpatient Hospital Stay (HOSPITAL_COMMUNITY)
Admission: EM | Admit: 2020-03-09 | Discharge: 2020-03-17 | DRG: 637 | Disposition: A | Discharge status: Other Acute Inpatient Hospital | Payer: BC Managed Care – PPO | Attending: Internal Medicine | Admitting: Internal Medicine

## 2020-03-09 ENCOUNTER — Inpatient Hospital Stay (HOSPITAL_COMMUNITY): Payer: BC Managed Care – PPO

## 2020-03-09 ENCOUNTER — Emergency Department (HOSPITAL_COMMUNITY): Payer: BC Managed Care – PPO

## 2020-03-09 ENCOUNTER — Other Ambulatory Visit: Payer: Self-pay

## 2020-03-09 DIAGNOSIS — R8290 Unspecified abnormal findings in urine: Secondary | ICD-10-CM | POA: Diagnosis present

## 2020-03-09 DIAGNOSIS — E0810 Diabetes mellitus due to underlying condition with ketoacidosis without coma: Secondary | ICD-10-CM | POA: Diagnosis not present

## 2020-03-09 DIAGNOSIS — E119 Type 2 diabetes mellitus without complications: Secondary | ICD-10-CM | POA: Diagnosis not present

## 2020-03-09 DIAGNOSIS — E876 Hypokalemia: Secondary | ICD-10-CM | POA: Diagnosis present

## 2020-03-09 DIAGNOSIS — R1310 Dysphagia, unspecified: Secondary | ICD-10-CM | POA: Diagnosis present

## 2020-03-09 DIAGNOSIS — F332 Major depressive disorder, recurrent severe without psychotic features: Secondary | ICD-10-CM | POA: Diagnosis present

## 2020-03-09 DIAGNOSIS — R4182 Altered mental status, unspecified: Secondary | ICD-10-CM

## 2020-03-09 DIAGNOSIS — D649 Anemia, unspecified: Secondary | ICD-10-CM | POA: Diagnosis present

## 2020-03-09 DIAGNOSIS — E1110 Type 2 diabetes mellitus with ketoacidosis without coma: Secondary | ICD-10-CM | POA: Diagnosis present

## 2020-03-09 DIAGNOSIS — R7989 Other specified abnormal findings of blood chemistry: Secondary | ICD-10-CM | POA: Diagnosis not present

## 2020-03-09 DIAGNOSIS — F333 Major depressive disorder, recurrent, severe with psychotic symptoms: Secondary | ICD-10-CM | POA: Diagnosis present

## 2020-03-09 DIAGNOSIS — Z20822 Contact with and (suspected) exposure to covid-19: Secondary | ICD-10-CM | POA: Diagnosis present

## 2020-03-09 DIAGNOSIS — N3289 Other specified disorders of bladder: Secondary | ICD-10-CM | POA: Diagnosis present

## 2020-03-09 DIAGNOSIS — R809 Proteinuria, unspecified: Secondary | ICD-10-CM | POA: Diagnosis present

## 2020-03-09 DIAGNOSIS — Z79899 Other long term (current) drug therapy: Secondary | ICD-10-CM | POA: Diagnosis not present

## 2020-03-09 DIAGNOSIS — R030 Elevated blood-pressure reading, without diagnosis of hypertension: Secondary | ICD-10-CM | POA: Diagnosis present

## 2020-03-09 DIAGNOSIS — R339 Retention of urine, unspecified: Secondary | ICD-10-CM | POA: Diagnosis present

## 2020-03-09 DIAGNOSIS — R400 Somnolence: Secondary | ICD-10-CM | POA: Diagnosis not present

## 2020-03-09 DIAGNOSIS — G9341 Metabolic encephalopathy: Secondary | ICD-10-CM | POA: Diagnosis not present

## 2020-03-09 HISTORY — DX: Other specified abnormal findings of blood chemistry: R79.89

## 2020-03-09 HISTORY — DX: Metabolic encephalopathy: G93.41

## 2020-03-09 LAB — CBC
HCT: 42.8 % (ref 39.0–52.0)
Hemoglobin: 14.7 g/dL (ref 13.0–17.0)
MCH: 32.5 pg (ref 26.0–34.0)
MCHC: 34.3 g/dL (ref 30.0–36.0)
MCV: 94.5 fL (ref 80.0–100.0)
Platelets: 270 10*3/uL (ref 150–400)
RBC: 4.53 MIL/uL (ref 4.22–5.81)
RDW: 11.9 % (ref 11.5–15.5)
WBC: 7.6 10*3/uL (ref 4.0–10.5)
nRBC: 0 % (ref 0.0–0.2)

## 2020-03-09 LAB — I-STAT VENOUS BLOOD GAS, ED
Acid-base deficit: 4 mmol/L — ABNORMAL HIGH (ref 0.0–2.0)
Bicarbonate: 21.1 mmol/L (ref 20.0–28.0)
Calcium, Ion: 1.22 mmol/L (ref 1.15–1.40)
HCT: 42 % (ref 39.0–52.0)
Hemoglobin: 14.3 g/dL (ref 13.0–17.0)
O2 Saturation: 91 %
Potassium: 4.4 mmol/L (ref 3.5–5.1)
Sodium: 143 mmol/L (ref 135–145)
TCO2: 22 mmol/L (ref 22–32)
pCO2, Ven: 37.1 mmHg — ABNORMAL LOW (ref 44.0–60.0)
pH, Ven: 7.363 (ref 7.250–7.430)
pO2, Ven: 62 mmHg — ABNORMAL HIGH (ref 32.0–45.0)

## 2020-03-09 LAB — BASIC METABOLIC PANEL
Anion gap: 15 (ref 5–15)
BUN: 13 mg/dL (ref 6–20)
CO2: 22 mmol/L (ref 22–32)
Calcium: 9.8 mg/dL (ref 8.9–10.3)
Chloride: 104 mmol/L (ref 98–111)
Creatinine, Ser: 0.85 mg/dL (ref 0.61–1.24)
GFR, Estimated: >60 mL/min (ref >60)
Glucose, Bld: 216 mg/dL — ABNORMAL HIGH (ref 70–99)
Potassium: 3.7 mmol/L (ref 3.5–5.1)
Sodium: 141 mmol/L (ref 135–145)

## 2020-03-09 LAB — COMPREHENSIVE METABOLIC PANEL
ALT: 97 U/L — ABNORMAL HIGH (ref 0–44)
AST: 46 U/L — ABNORMAL HIGH (ref 15–41)
Albumin: 4.1 g/dL (ref 3.5–5.0)
Alkaline Phosphatase: 86 U/L (ref 38–126)
Anion gap: 21 — ABNORMAL HIGH (ref 5–15)
BUN: 12 mg/dL (ref 6–20)
CO2: 20 mmol/L — ABNORMAL LOW (ref 22–32)
Calcium: 10 mg/dL (ref 8.9–10.3)
Chloride: 98 mmol/L (ref 98–111)
Creatinine, Ser: 0.83 mg/dL (ref 0.61–1.24)
GFR, Estimated: >60 mL/min (ref >60)
Glucose, Bld: 343 mg/dL — ABNORMAL HIGH (ref 70–99)
Potassium: 4.3 mmol/L (ref 3.5–5.1)
Sodium: 139 mmol/L (ref 135–145)
Total Bilirubin: 1.7 mg/dL — ABNORMAL HIGH (ref 0.3–1.2)
Total Protein: 9.1 g/dL — ABNORMAL HIGH (ref 6.5–8.1)

## 2020-03-09 LAB — URINALYSIS, ROUTINE W REFLEX MICROSCOPIC
Bacteria, UA: NONE SEEN
Bilirubin Urine: NEGATIVE
Glucose, UA: >=500 mg/dL — AB
Hgb urine dipstick: NEGATIVE
Ketones, ur: 80 mg/dL — AB
Nitrite: NEGATIVE
Protein, ur: 30 mg/dL — AB
Specific Gravity, Urine: 1.036 — ABNORMAL HIGH (ref 1.005–1.030)
pH: 5 (ref 5.0–8.0)

## 2020-03-09 LAB — CBG MONITORING, ED
Glucose-Capillary: 282 mg/dL — ABNORMAL HIGH (ref 70–99)
Glucose-Capillary: 262 mg/dL — ABNORMAL HIGH (ref 70–99)
Glucose-Capillary: 191 mg/dL — ABNORMAL HIGH (ref 70–99)
Glucose-Capillary: 204 mg/dL — ABNORMAL HIGH (ref 70–99)
Glucose-Capillary: 173 mg/dL — ABNORMAL HIGH (ref 70–99)
Glucose-Capillary: 194 mg/dL — ABNORMAL HIGH (ref 70–99)
Glucose-Capillary: 186 mg/dL — ABNORMAL HIGH (ref 70–99)

## 2020-03-09 LAB — RAPID URINE DRUG SCREEN, HOSP PERFORMED
Amphetamines: NOT DETECTED
Barbiturates: NOT DETECTED
Benzodiazepines: NOT DETECTED
Cocaine: NOT DETECTED
Opiates: NOT DETECTED
Tetrahydrocannabinol: NOT DETECTED

## 2020-03-09 LAB — HEMOGLOBIN A1C
Hgb A1c MFr Bld: 13.1 % — ABNORMAL HIGH (ref 4.8–5.6)
Mean Plasma Glucose: 329.27 mg/dL

## 2020-03-09 LAB — ETHANOL: Alcohol, Ethyl (B): <10 mg/dL (ref <10)

## 2020-03-09 LAB — ACETAMINOPHEN LEVEL: Acetaminophen (Tylenol), Serum: <10 ug/mL — ABNORMAL LOW (ref 10–30)

## 2020-03-09 LAB — LACTIC ACID, PLASMA: Lactic Acid, Venous: 1.4 mmol/L (ref 0.5–1.9)

## 2020-03-09 LAB — BETA-HYDROXYBUTYRIC ACID: Beta-Hydroxybutyric Acid: >8 mmol/L — ABNORMAL HIGH (ref 0.05–0.27)

## 2020-03-09 LAB — RESP PANEL BY RT-PCR (FLU A&B, COVID) ARPGX2
Influenza A by PCR: NEGATIVE
Influenza B by PCR: NEGATIVE
SARS Coronavirus 2 by RT PCR: NEGATIVE

## 2020-03-09 LAB — AMMONIA: Ammonia: 18 umol/L (ref 9–35)

## 2020-03-09 MED ORDER — LACTATED RINGERS IV BOLUS
1000.0000 mL | Freq: Once | INTRAVENOUS | Status: AC
  Administered 2020-03-09: 1000 mL via INTRAVENOUS

## 2020-03-09 MED ORDER — LACTATED RINGERS IV BOLUS
500.0000 mL | Freq: Once | INTRAVENOUS | Status: AC
  Administered 2020-03-09: 500 mL via INTRAVENOUS

## 2020-03-09 MED ORDER — LACTATED RINGERS IV SOLN: INTRAVENOUS | Status: DC

## 2020-03-09 MED ORDER — INSULIN REGULAR(HUMAN) IN NACL 100-0.9 UT/100ML-% IV SOLN
INTRAVENOUS | Status: DC
  Administered 2020-03-09: 4.4 [IU]/h via INTRAVENOUS
  Filled 2020-03-09: qty 100

## 2020-03-09 MED ORDER — IOHEXOL 300 MG/ML  SOLN
100.0000 mL | Freq: Once | INTRAMUSCULAR | Status: AC | PRN
  Administered 2020-03-09: 100 mL via INTRAVENOUS

## 2020-03-09 MED ORDER — ENOXAPARIN SODIUM 40 MG/0.4ML ~~LOC~~ SOLN
40.0000 mg | SUBCUTANEOUS | Status: DC
  Administered 2020-03-09 – 2020-03-16 (×8): 40 mg via SUBCUTANEOUS
  Filled 2020-03-09 (×8): qty 0.4

## 2020-03-09 MED ORDER — DEXTROSE IN LACTATED RINGERS 5 % IV SOLN: INTRAVENOUS | Status: DC

## 2020-03-09 MED ORDER — DEXTROSE 50 % IV SOLN: 0.0000 mL | INTRAVENOUS | Status: DC | PRN

## 2020-03-09 NOTE — ED Notes (Signed)
Patient transported to CT 

## 2020-03-09 NOTE — ED Triage Notes (Signed)
Pt accompanied by son who reports that his father woke up altered this morning, is not communicating with the family per normal. Also reports high blood pressure. Pt unable to answer orientation questions appropriately. Pt speaks Garrett [name].

## 2020-03-09 NOTE — H&P (Signed)
History and Physical    Garrett [name] SEG:[id number] DOB: [birth year] DOA: 03/09/2020  PCP: Patient, No Pcp Per Patient coming from: Home  Chief Complaint: Altered mental status  HPI: Garrett [name] is a 34 y.o. 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  Per triage note, family reported that patient woke up altered this morning and has not been communicating as per usual.  Patient was not able to answer orientation questions appropriately at triage.  Interpreter services used to obtain history as patient speaks [name].  Patient appears confused and oriented to person and place only.  He is not sure why he is here and has no complaints.  Reports history of diabetes but does not know when he was diagnosed and states he is not on any medications.  Denies fevers, cough, shortness of breath, chest pain, nausea, vomiting, abdominal pain, or diarrhea.  No additional history could be obtained from him.  ED Course: Afebrile.  Tachycardic with heart rate up to 120s.  Not hypotensive.  Not hypoxic.  WBC 7.6, hemoglobin 14.7, hematocrit 42.8, platelet 270K.  Sodium 139, potassium 4.3, chloride 98, bicarb 20, anion gap 21, BUN 12, creatinine 0.8, glucose 343.  AST 46, ALT 97, T bili 1.7.  Alk phos normal.  Hemoglobin A1c 13.1.  SARS-CoV-2 PCR test and influenza panel both negative.  Blood culture x2 pending.  UDS negative.  Blood ethanol and acetaminophen levels undetectable.  Lactic acid within normal range.  Ammonia level within normal range.  UA with evidence of glucosuria, ketonuria, mild proteinuria, negative nitrite, trace leukocytes, 11-20 WBCs, and no bacteria.  Beta hydroxybutyric acid >8.0.  VBG with pH 7.36.  Chest x-ray negative for acute abnormality.  Head CT negative for acute intracranial abnormality.  CT abdomen pelvis showing severely  distended urinary bladder with mild bilateral collecting system dilatation but no other acute finding.  Patient voided spontaneously in the ED.  Patient was started on insulin infusion and IV fluids per DKA protocol.  Review of Systems:  All systems reviewed and apart from history of presenting illness, are negative.  Past Medical History:  Diagnosis Date  . Depression     No past surgical history on file.   reports that he has never smoked. He has never used smokeless tobacco. No history on file for alcohol use and drug use.  No Known Allergies  Family history: Unable to obtain from the patient at this time given his altered mental status.  Prior to Admission medications   Medication Sig Start Date End Date Taking? Authorizing Provider  escitalopram (LEXAPRO) 10 MG tablet Take 1 tablet (10 mg total) by mouth daily. For mood control 07/05/17   Money, Lowry Ram, FNP  LORazepam (ATIVAN) 0.5 MG tablet Take 1 tablet (0.5 mg total) by mouth 2 (two) times daily. For anxiety 07/05/17   Money, Lowry Ram, FNP  risperiDONE (RISPERDAL) 2 MG tablet Take 1 tablet (2 mg total) by mouth 2 (two) times daily. For mood control 07/05/17   Money, Lowry Ram, FNP  traZODone (DESYREL) 100 MG tablet Take 1 tablet (100 mg total) by mouth at bedtime as needed for sleep. 07/05/17   Money, Lowry Ram, FNP    Physical Exam: Vitals:   03/09/20 2015 03/09/20 2030 03/09/20 2100 03/09/20 2130  BP: (!) 123/96 (!) 117/91 121/87 (!) 128/98  Pulse: 99 97 95 Marland Kitchen)  102  Resp: _0 Temp:      TempSrc:      SpO2: 96% 98% 98% 98%    Physical Exam Constitutional:      General: He is not in acute distress. HENT:     Head: Normocephalic and atraumatic.     Mouth/Throat:     Mouth: Mucous membranes are moist.  Eyes:     Extraocular Movements: Extraocular movements intact.  Cardiovascular:     Rate and Rhythm: Normal rate and regular rhythm.     Pulses: Normal pulses.  Pulmonary:     Effort: Pulmonary effort is  normal. No respiratory distress.     Breath sounds: Normal breath sounds. No wheezing or rales.  Abdominal:     General: Bowel sounds are normal. There is no distension.     Palpations: Abdomen is soft.     Tenderness: There is no abdominal tenderness. There is no guarding or rebound.  Musculoskeletal:        General: No swelling or tenderness.     Cervical back: Normal range of motion and neck supple.  Skin:    General: Skin is warm and dry.  Neurological:     General: No focal deficit present.     Mental Status: He is alert.     Comments: Confused Oriented to person and place only     Labs on Admission: I have personally reviewed following labs and imaging studies  CBC: Recent Labs  Lab 03/09/20 1411 03/09/20 1702  WBC 7.6  --   HGB 14.7 14.3  HCT 42.8 42.0  MCV 94.5  --   PLT 270  --    Basic Metabolic Panel: Recent Labs  Lab 03/09/20 1411 03/09/20 1702 03/09/20 1945  NA 139 143 141  K 4.3 4.4 3.7  CL 98  --  104  CO2 20*  --  22  GLUCOSE 343*  --  216*  BUN 12  --  13  CREATININE 0.83  --  0.85  CALCIUM 10.0  --  9.8   GFR: CrCl cannot be calculated (Unknown ideal weight.). Liver Function Tests: Recent Labs  Lab 03/09/20 1411  AST 46*  ALT 97*  ALKPHOS 86  BILITOT 1.7*  PROT 9.1*  ALBUMIN 4.1   No results for input(s): LIPASE, AMYLASE in the last 168 hours. Recent Labs  Lab 03/09/20 1644  AMMONIA 18   Coagulation Profile: No results for input(s): INR, PROTIME in the last 168 hours. Cardiac Enzymes: No results for input(s): CKTOTAL, CKMB, CKMBINDEX, TROPONINI in the last 168 hours. BNP (last 3 results) No results for input(s): PROBNP in the last 8760 hours. HbA1C: Recent Labs    03/09/20 1411  HGBA1C 13.1*   CBG: Recent Labs  Lab 03/09/20 1721 03/09/20 1834 03/09/20 1933 03/09/20 2035 03/09/20 2135  GLUCAP 282* 262* 191* 204* 194*   Lipid Profile: No results for input(s): CHOL, HDL, LDLCALC, TRIG, CHOLHDL, LDLDIRECT in the  last 72 hours. Thyroid Function Tests: No results for input(s): TSH, T4TOTAL, FREET4, T3FREE, THYROIDAB in the last 72 hours. Anemia Panel: No results for input(s): VITAMINB12, FOLATE, FERRITIN, TIBC, IRON, RETICCTPCT in the last 72 hours. Urine analysis:    Component Value Date/Time   COLORURINE YELLOW 03/09/2020 1945   APPEARANCEUR CLEAR 03/09/2020 1945   LABSPEC 1.036 (H) 03/09/2020 1945   PHURINE 5.0 03/09/2020 1945   GLUCOSEU >=500 (A) 03/09/2020 1945   HGBUR NEGATIVE 03/09/2020 [place] NEGATIVE 03/09/2020 1945  KETONESUR 80 (A) 03/09/2020 1945   PROTEINUR 30 (A) 03/09/2020 1945   NITRITE NEGATIVE 03/09/2020 1945   LEUKOCYTESUR TRACE (A) 03/09/2020 1945    Radiological Exams on Admission: DG Chest 2 View  Result Date: 03/09/2020 CLINICAL DATA:  Chest pain.  Altered mental status. EXAM: CHEST - 2 VIEW COMPARISON:  None. FINDINGS: The heart size and mediastinal contours are within normal limits. Both lungs are clear. The visualized skeletal structures are unremarkable. IMPRESSION: Negative two view chest x-ray Electronically Signed   By: San Morelle M.D.   On: 03/09/2020 17:37   CT Head Wo Contrast  Result Date: 03/09/2020 CLINICAL DATA:  Mental status change, unknown cause. Patient not communicating as he normally does. EXAM: CT HEAD WITHOUT CONTRAST TECHNIQUE: Contiguous axial images were obtained from the base of the skull through the vertex without intravenous contrast. COMPARISON:  None. FINDINGS: Brain: No acute infarct, hemorrhage, or mass lesion is present. The ventricles are of normal size. No significant extraaxial fluid collection is present. The brainstem and cerebellum are within normal limits. Vascular: No hyperdense vessel or unexpected calcification. Skull: Calvarium is intact. No focal lytic or blastic lesions are present. No significant extracranial soft tissue lesion is present. Sinuses/Orbits: Posterior right ethmoid air cells are opacified.  There is scattered mucosal thickening otherwise. A fluid level is present the right maxillary sinus. There is some mucosal thickening in both maxillary sinuses. Mastoid air cells are clear. The globes and orbits are within normal limits. IMPRESSION: 1. Normal CT appearance of the brain. No acute or focal abnormality to explain mental status changes. 2. Right maxillary sinus disease. Electronically Signed   By: San Morelle M.D.   On: 03/09/2020 18:30   CT Abdomen Pelvis W Contrast  Result Date: 03/09/2020 CLINICAL DATA:  Right lower quadrant abdominal pain. EXAM: CT ABDOMEN AND PELVIS WITH CONTRAST TECHNIQUE: Multidetector CT imaging of the abdomen and pelvis was performed using the standard protocol following bolus administration of intravenous contrast. CONTRAST:  193m OMNIPAQUE IOHEXOL 300 MG/ML  SOLN COMPARISON:  None. FINDINGS: Lower chest: The lung bases are clear. The heart size is normal. Hepatobiliary: The liver is normal. Normal gallbladder.There is no biliary ductal dilation. Pancreas: Normal contours without ductal dilatation. No peripancreatic fluid collection. Spleen: Unremarkable. Adrenals/Urinary Tract: --Adrenal glands: Unremarkable. --Right kidney/ureter: There is mild collecting system dilatation to the level of the urinary bladder. --Left kidney/ureter: There is mild collecting system dilatation to the level of the urinary bladder. --Urinary bladder: The urinary bladder is severely distended. Stomach/Bowel: --Stomach/Duodenum: No hiatal hernia or other gastric abnormality. Normal duodenal course and caliber. --Small bowel: Unremarkable. --Colon: Large of the colon --Appendix: Normal. Vascular/Lymphatic: Atherosclerotic calcification is present within the non-aneurysmal abdominal aorta, without hemodynamically significant stenosis. --No retroperitoneal lymphadenopathy. --No mesenteric lymphadenopathy. --No pelvic or inguinal lymphadenopathy. Reproductive: Unremarkable Other: No  ascites or free air. The abdominal wall is normal. Musculoskeletal. No acute displaced fractures. IMPRESSION: 1. Normal appendix in the right lower quadrant. 2. Severely distended urinary bladder with mild bilateral collecting system dilatation to the level of the urinary bladder. 3. Atherosclerotic changes of the abdominal aorta, much greater than expected for a patient of this age. Aortic Atherosclerosis (ICD10-I70.0). Electronically Signed   By: CConstance HolsterM.D.   On: 03/09/2020 19:09    EKG: Independently reviewed.  Sinus tachycardia.  Assessment/Plan Principal Problem:   DKA (diabetic ketoacidosis) ([facility] Active Problems:   Major depressive disorder, recurrent severe without psychotic features ([facility]   Diabetes mellitus, new onset ([facility]  Acute metabolic encephalopathy   Elevated LFTs   New onset diabetes with DKA Patient reports having diabetes but currently confused and history may not be reliable.  No documented history of diabetes in the chart and last A1c was 5.0 in March 2019.  Presenting with altered mental status and hyperglycemia.  Labs in the ED showing blood glucose 343, bicarb 20, anion gap 21, ketones on urinalysis, and beta hydroxybutyric acid >8.0.  VBG with pH 7.36.  Repeat A1c significantly elevated at 13.1 consistent with diabetes. -Patient was started on IV insulin and fluids per DKA protocol in the ED, continue.  Keep n.p.o. Frequent CBG checks.  Monitor BMP every 4 hours.  Acute metabolic encephalopathy Suspect due to DKA.  Does have a history of MDD with psychotic features.  Head CT negative for acute intracranial abnormality.  No focal neuro deficit on exam.  Currently confused and oriented to person and place only.  Ammonia level within normal range.  UDS negative.  Blood ethanol level undetectable. -Continue management of DKA and monitor mental status  Mild elevation of LFTs AST 46, ALT 97, T bili 1.7.  Alk phos normal.  No hepatobiliary abnormality on  CT. -Hepatitis panel and right upper quadrant ultrasound  Abnormal urinalysis UA with negative nitrite, trace leukocytes, 11-20 WBCs, no bacteria.  No fever or leukocytosis. -Urine culture ordered  Distended bladder on CT CT abdomen pelvis showing severely distended urinary bladder with mild bilateral collecting system dilatation.  Patient voided spontaneously in the ED. -Bladder scans every 4 hours  MDD with psychotic features -Resume home meds after pharmacy med rec is done.  DVT prophylaxis: Lovenox Code Status: Full code Family Communication: No family available at this time. Disposition Plan: Status is: Inpatient  Remains inpatient appropriate because:Altered mental status, IV treatments appropriate due to intensity of illness or inability to take PO and Inpatient level of care appropriate due to severity of illness   Dispo: The patient is from: Home              Anticipated d/c is to: Home              Anticipated d/c date is: 2 days              Patient currently is not medically stable to d/c.  The medical decision making on this patient was of high complexity and the patient is at high risk for clinical deterioration, therefore this is a level 3 visit.  Shela Leff MD [facility]  If 7PM-7AM, please contact night-coverage [email or web address]  03/09/2020, 9:37 PM

## 2020-03-09 NOTE — ED Provider Notes (Signed)
[facility]   CSN: [id number] Arrival date & time: 03/09/20  1314     History Chief Complaint  Patient presents with  . Altered Mental Status    Garrett [name] is a 34 y.o. male with a past medical history of major depressive disorder with psychosis, chart review shows prior catatonic-like behaviors, who presents today for evaluation of altered mental status. Triage note reports that patient woke up altered this morning and is not communicating as per usual with high blood pressure.  Level 5 caveat for altered mental status  Patient speaks Clydie BraunKaren, using professional video Clydie BraunKaren speaking medical interpreter patient says that he feels like he is confused. He states that he has a history of diabetes and is not taking any medicine. He reports chest and abdominal pain, however he does not appear to be a reliable historian.    Chart review shows that his last hemoglobin A1c visible in the system was in 2019 which was normal. I do not see in the [facility] system a prior documented episode of hyperglycemia.  HPI     Past Medical History:  Diagnosis Date  . Depression     Patient Active Problem List   Diagnosis Date Noted  . DKA (diabetic ketoacidosis) (HCC) 03/09/2020  . Diabetes mellitus, new onset (HCC) 03/09/2020  . Acute metabolic encephalopathy 03/09/2020  . Elevated LFTs 03/09/2020  . Major depressive disorder, recurrent severe without psychotic features (HCC) 06/20/2017  . MDD (major depressive disorder), recurrent, severe, with psychosis (HCC) 06/19/2017    No past surgical history on file.     No family history on file.  Social History   Tobacco Use  . Smoking status: Never Smoker  . Smokeless tobacco: Never Used  Substance Use Topics  . Alcohol use: Not on file  . Drug use: Not on file    Home Medications Prior to Admission medications   Medication Sig Start Date End Date Taking? Authorizing Provider  escitalopram  (LEXAPRO) 10 MG tablet Take 1 tablet (10 mg total) by mouth daily. For mood control 07/05/17   Money, Gerlene Burdockravis B, FNP  LORazepam (ATIVAN) 0.5 MG tablet Take 1 tablet (0.5 mg total) by mouth 2 (two) times daily. For anxiety 07/05/17   Money, Gerlene Burdockravis B, FNP  risperiDONE (RISPERDAL) 2 MG tablet Take 1 tablet (2 mg total) by mouth 2 (two) times daily. For mood control 07/05/17   Money, Gerlene Burdockravis B, FNP  traZODone (DESYREL) 100 MG tablet Take 1 tablet (100 mg total) by mouth at bedtime as needed for sleep. 07/05/17   Money, Gerlene Burdockravis B, FNP    Allergies    Patient has no known allergies.  Review of Systems   Review of Systems  Unable to perform ROS: Mental status change    Physical Exam Updated Vital Signs BP (!) 128/98   Pulse (!) 102   Temp 99 F (37.2 C) (Rectal)   Resp 20   SpO2 98%   Physical Exam Vitals and nursing note reviewed.  Constitutional:      Appearance: He is well-developed. He is not diaphoretic.  HENT:     Head: Normocephalic and atraumatic.  Eyes:     General: No scleral icterus.       Right eye: No discharge.        Left eye: No discharge.     Conjunctiva/sclera: Conjunctivae normal.  Cardiovascular:     Rate and Rhythm: Regular rhythm. Tachycardia present.     Heart sounds: Normal  heart sounds.  Pulmonary:     Effort: Pulmonary effort is normal. No respiratory distress.     Breath sounds: Normal breath sounds. No stridor.  Abdominal:     General: There is no distension.     Tenderness: There is abdominal tenderness (Tenderness in bilateral lower quadrants, right significantly worse than left. With palpation of the right lower quadrant he flexes his legs up.).  Musculoskeletal:        General: No deformity.     Cervical back: Normal range of motion and neck supple. No rigidity.     Right lower leg: No edema.     Left lower leg: No edema.  Skin:    General: Skin is warm and dry.  Neurological:     Mental Status: He is alert.     Motor: No abnormal muscle tone.      Comments: Patient is awake, he is oriented to person and place does not know the year. Per interpreter he frequently answers with unintelligible responses. He is unable to repeat simple phrases in [name]. His speech is quiet however is not obviously slurred. 5/5 strength bilateral upper and lower extremities and follows commands however appears slow to do so and is slow to answer questions.  Psychiatric:     Comments: Unable to fully assess due to mental status. Patient is very slow to respond and is withdrawn.     ED Results / Procedures / Treatments   Labs (all labs ordered are listed, but only abnormal results are displayed) Labs Reviewed  COMPREHENSIVE METABOLIC PANEL - Abnormal; Notable for the following components:      Result Value   CO2 20 (*)    Glucose, Bld 343 (*)    Total Protein 9.1 (*)    AST 46 (*)    ALT 97 (*)    Total Bilirubin 1.7 (*)    Anion gap 21 (*)    All other components within normal limits  URINALYSIS, ROUTINE W REFLEX MICROSCOPIC - Abnormal; Notable for the following components:   Specific Gravity, Urine 1.036 (*)    Glucose, UA >=500 (*)    Ketones, ur 80 (*)    Protein, ur 30 (*)    Leukocytes,Ua TRACE (*)    All other components within normal limits  ACETAMINOPHEN LEVEL - Abnormal; Notable for the following components:   Acetaminophen (Tylenol), Serum <10 (*)    All other components within normal limits  BETA-HYDROXYBUTYRIC ACID - Abnormal; Notable for the following components:   Beta-Hydroxybutyric Acid >8.00 (*)    All other components within normal limits  HEMOGLOBIN A1C - Abnormal; Notable for the following components:   Hgb A1c MFr Bld 13.1 (*)    All other components within normal limits  BASIC METABOLIC PANEL - Abnormal; Notable for the following components:   Glucose, Bld 216 (*)    All other components within normal limits  CBG MONITORING, ED - Abnormal; Notable for the following components:   Glucose-Capillary 282 (*)    All other  components within normal limits  I-STAT VENOUS BLOOD GAS, ED - Abnormal; Notable for the following components:   pCO2, Ven 37.1 (*)    pO2, Ven 62.0 (*)    Acid-base deficit 4.0 (*)    All other components within normal limits  CBG MONITORING, ED - Abnormal; Notable for the following components:   Glucose-Capillary 262 (*)    All other components within normal limits  CBG MONITORING, ED - Abnormal; Notable for the following components:  Glucose-Capillary 191 (*)    All other components within normal limits  CBG MONITORING, ED - Abnormal; Notable for the following components:   Glucose-Capillary 204 (*)    All other components within normal limits  CBG MONITORING, ED - Abnormal; Notable for the following components:   Glucose-Capillary 194 (*)    All other components within normal limits  RESP PANEL BY RT-PCR (FLU A&B, COVID) ARPGX2  CULTURE, BLOOD (ROUTINE X 2)  CULTURE, BLOOD (ROUTINE X 2)  URINE CULTURE  CBC  RAPID URINE DRUG SCREEN, HOSP PERFORMED  ETHANOL  LACTIC ACID, PLASMA  AMMONIA  OSMOLALITY  HIV ANTIBODY (ROUTINE TESTING W REFLEX)  BASIC METABOLIC PANEL  BASIC METABOLIC PANEL  BETA-HYDROXYBUTYRIC ACID  HEPATITIS PANEL, ACUTE  CBG MONITORING, ED  CBG MONITORING, ED    EKG EKG Interpretation  Date/Time:  Tuesday March 09 2020 17:07:39 EST Ventricular Rate:  111 PR Interval:    QRS Duration: 79 QT Interval:  320 QTC Calculation: 435 R Axis:   74 Text Interpretation: Sinus tachycardia Probable left atrial enlargement Consider anterior infarct Since last tracing rate faster Confirmed by Richardean Canal [phone]) on 03/09/2020 6:24:01 PM   Radiology DG Chest 2 View  Result Date: 03/09/2020 CLINICAL DATA:  Chest pain.  Altered mental status. EXAM: CHEST - 2 VIEW COMPARISON:  None. FINDINGS: The heart size and mediastinal contours are within normal limits. Both lungs are clear. The visualized skeletal structures are unremarkable. IMPRESSION: Negative two view  chest x-ray Electronically Signed   By: Marin Roberts M.D.   On: 03/09/2020 17:37   CT Head Wo Contrast  Result Date: 03/09/2020 CLINICAL DATA:  Mental status change, unknown cause. Patient not communicating as he normally does. EXAM: CT HEAD WITHOUT CONTRAST TECHNIQUE: Contiguous axial images were obtained from the base of the skull through the vertex without intravenous contrast. COMPARISON:  None. FINDINGS: Brain: No acute infarct, hemorrhage, or mass lesion is present. The ventricles are of normal size. No significant extraaxial fluid collection is present. The brainstem and cerebellum are within normal limits. Vascular: No hyperdense vessel or unexpected calcification. Skull: Calvarium is intact. No focal lytic or blastic lesions are present. No significant extracranial soft tissue lesion is present. Sinuses/Orbits: Posterior right ethmoid air cells are opacified. There is scattered mucosal thickening otherwise. A fluid level is present the right maxillary sinus. There is some mucosal thickening in both maxillary sinuses. Mastoid air cells are clear. The globes and orbits are within normal limits. IMPRESSION: 1. Normal CT appearance of the brain. No acute or focal abnormality to explain mental status changes. 2. Right maxillary sinus disease. Electronically Signed   By: Marin Roberts M.D.   On: 03/09/2020 18:30   CT Abdomen Pelvis W Contrast  Result Date: 03/09/2020 CLINICAL DATA:  Right lower quadrant abdominal pain. EXAM: CT ABDOMEN AND PELVIS WITH CONTRAST TECHNIQUE: Multidetector CT imaging of the abdomen and pelvis was performed using the standard protocol following bolus administration of intravenous contrast. CONTRAST:  OMNIPAQUE IOHEXOL 300 MG/ML  SOLN COMPARISON:  None. FINDINGS: Lower chest: The lung bases are clear. The heart size is normal. Hepatobiliary: The liver is normal. Normal gallbladder.There is no biliary ductal dilation. Pancreas: Normal contours without ductal  dilatation. No peripancreatic fluid collection. Spleen: Unremarkable. Adrenals/Urinary Tract: --Adrenal glands: Unremarkable. --Right kidney/ureter: There is mild collecting system dilatation to the level of the urinary bladder. --Left kidney/ureter: There is mild collecting system dilatation to the level of the urinary bladder. --Urinary bladder: The urinary bladder is  severely distended. Stomach/Bowel: --Stomach/Duodenum: No hiatal hernia or other gastric abnormality. Normal duodenal course and caliber. --Small bowel: Unremarkable. --Colon: Large of the colon --Appendix: Normal. Vascular/Lymphatic: Atherosclerotic calcification is present within the non-aneurysmal abdominal aorta, without hemodynamically significant stenosis. --No retroperitoneal lymphadenopathy. --No mesenteric lymphadenopathy. --No pelvic or inguinal lymphadenopathy. Reproductive: Unremarkable Other: No ascites or free air. The abdominal wall is normal. Musculoskeletal. No acute displaced fractures. IMPRESSION: 1. Normal appendix in the right lower quadrant. 2. Severely distended urinary bladder with mild bilateral collecting system dilatation to the level of the urinary bladder. 3. Atherosclerotic changes of the abdominal aorta, much greater than expected for a patient of this age. Aortic Atherosclerosis (ICD10-I70.0). Electronically Signed   By: Katherine Mantle M.D.   On: 03/09/2020 19:09    Procedures .Critical Care Performed by: Cristina Gong, PA-C Authorized by: Cristina Gong, PA-C   Critical care provider statement:    Critical care time (minutes):  45   Critical care was time spent personally by me on the following activities:  Discussions with consultants, evaluation of patient's response to treatment, examination of patient, ordering and performing treatments and interventions, ordering and review of laboratory studies, ordering and review of radiographic studies, pulse oximetry, re-evaluation of patient's  condition, obtaining history from patient or surrogate and review of old charts   (including critical care time)  Medications Ordered in ED Medications  insulin regular, human (MYXREDLIN) 100 units/ 100 mL infusion (2.8 Units/hr Intravenous Rate/Dose Change 03/09/20 2138)  dextrose 5 % in lactated ringers infusion ( Intravenous New Bag/Given 03/09/20 1940)  dextrose 50 % solution 0-50 mL (has no administration in time range)  lactated ringers infusion (has no administration in time range)  enoxaparin (LOVENOX) injection 40 mg (has no administration in time range)  lactated ringers bolus 500 mL (0 mLs Intravenous Stopped 03/09/20 1824)  lactated ringers bolus 1,000 mL (0 mLs Intravenous Stopped 03/09/20 1940)  iohexol (OMNIPAQUE) 300 MG/ML solution 100 mL (100 mLs Intravenous Contrast Given 03/09/20 1858)    ED Course  I have reviewed the triage vital signs and the nursing notes.  Pertinent labs & imaging results that were available during my care of the patient were reviewed by me and considered in my medical decision making (see chart for details).    MDM Rules/Calculators/A&P                         Norah Titsworth is a 34 year old man who presents today for altered mental status.  Unclear history.  He is oriented to person and place, not to time.  Here he is hyperglycemic.  CBC is unremarkable.  CMP shows elevated total bilirubin, CO2 is slightly low at 20 with a glucose of 343 and an anion gap of 21.  VBG shows no significant pH alteration.  Beta hydroxybutyric acid is elevated over 8.  On small is ordered.  UDS is negative.  Given his anion gap, hyperglycemia, and tachycardia with altered mental status and anion gap he is started on glucose stabilizer including IV fluids and IV insulin.  CT head is obtained without acute abnormality.  A1c is significantly elevated at 13.1 suggestive of diabetes.  Ethanol and ammonia are not detected.  On exam he had tenderness in his lower abdomen primarily on  the right side, CT abdomen pelvis is obtained without appendicitis or other abnormality.  Did show that had significant urinary bladder volume and patient was able to urinate after CT scan  without difficulty.  Patient will require admission.  I spoke with Dr. Loney Loh who will see patient for admission.   Note: Portions of this report may have been transcribed using voice recognition software. Every effort was made to ensure accuracy; however, inadvertent computerized transcription errors may be present  Final Clinical Impression(s) / ED Diagnoses Final diagnoses:  Altered mental status, unspecified altered mental status type  Diabetes mellitus, new onset (HCC)  Acute metabolic encephalopathy    Rx / DC Orders ED Discharge Orders    None       Norman Clay 03/09/20 2220    Charlynne Pander, MD 03/09/20 2233

## 2020-03-10 DIAGNOSIS — R7989 Other specified abnormal findings of blood chemistry: Secondary | ICD-10-CM

## 2020-03-10 DIAGNOSIS — F332 Major depressive disorder, recurrent severe without psychotic features: Secondary | ICD-10-CM

## 2020-03-10 LAB — CBG MONITORING, ED
Glucose-Capillary: 135 mg/dL — ABNORMAL HIGH (ref 70–99)
Glucose-Capillary: 156 mg/dL — ABNORMAL HIGH (ref 70–99)
Glucose-Capillary: 158 mg/dL — ABNORMAL HIGH (ref 70–99)
Glucose-Capillary: 170 mg/dL — ABNORMAL HIGH (ref 70–99)
Glucose-Capillary: 207 mg/dL — ABNORMAL HIGH (ref 70–99)
Glucose-Capillary: 247 mg/dL — ABNORMAL HIGH (ref 70–99)

## 2020-03-10 LAB — OSMOLALITY: Osmolality: 312 mOsm/kg — ABNORMAL HIGH (ref 275–295)

## 2020-03-10 LAB — BASIC METABOLIC PANEL
Anion gap: 13 (ref 5–15)
Anion gap: 10 (ref 5–15)
BUN: 10 mg/dL (ref 6–20)
BUN: 10 mg/dL (ref 6–20)
CO2: 26 mmol/L (ref 22–32)
CO2: 28 mmol/L (ref 22–32)
Calcium: 9.9 mg/dL (ref 8.9–10.3)
Calcium: 9.7 mg/dL (ref 8.9–10.3)
Chloride: 105 mmol/L (ref 98–111)
Chloride: 106 mmol/L (ref 98–111)
Creatinine, Ser: 0.61 mg/dL (ref 0.61–1.24)
Creatinine, Ser: 0.56 mg/dL — ABNORMAL LOW (ref 0.61–1.24)
GFR, Estimated: >60 mL/min (ref >60)
GFR, Estimated: >60 mL/min (ref >60)
Glucose, Bld: 209 mg/dL — ABNORMAL HIGH (ref 70–99)
Glucose, Bld: 177 mg/dL — ABNORMAL HIGH (ref 70–99)
Potassium: 3.6 mmol/L (ref 3.5–5.1)
Potassium: 3.3 mmol/L — ABNORMAL LOW (ref 3.5–5.1)
Sodium: 144 mmol/L (ref 135–145)
Sodium: 144 mmol/L (ref 135–145)

## 2020-03-10 LAB — GLUCOSE, CAPILLARY
Glucose-Capillary: 291 mg/dL — ABNORMAL HIGH (ref 70–99)
Glucose-Capillary: 192 mg/dL — ABNORMAL HIGH (ref 70–99)

## 2020-03-10 LAB — BETA-HYDROXYBUTYRIC ACID: Beta-Hydroxybutyric Acid: 1.86 mmol/L — ABNORMAL HIGH (ref 0.05–0.27)

## 2020-03-10 LAB — HIV ANTIBODY (ROUTINE TESTING W REFLEX): HIV Screen 4th Generation wRfx: NONREACTIVE

## 2020-03-10 MED ORDER — POTASSIUM CHLORIDE CRYS ER 20 MEQ PO TBCR
40.0000 meq | EXTENDED_RELEASE_TABLET | Freq: Once | ORAL | Status: AC
  Administered 2020-03-10: 40 meq via ORAL
  Filled 2020-03-10: qty 2

## 2020-03-10 MED ORDER — TRAZODONE HCL 50 MG PO TABS
100.0000 mg | ORAL_TABLET | Freq: Every evening | ORAL | Status: DC | PRN
  Filled 2020-03-10: qty 2

## 2020-03-10 MED ORDER — INSULIN GLARGINE 100 UNIT/ML ~~LOC~~ SOLN
10.0000 [IU] | Freq: Every day | SUBCUTANEOUS | Status: DC
  Administered 2020-03-10 – 2020-03-11 (×2): 10 [IU] via SUBCUTANEOUS
  Filled 2020-03-10 (×2): qty 0.1

## 2020-03-10 MED ORDER — RISPERIDONE 0.5 MG PO TABS
2.0000 mg | ORAL_TABLET | Freq: Two times a day (BID) | ORAL | Status: DC
  Administered 2020-03-10 – 2020-03-17 (×12): 2 mg via ORAL
  Filled 2020-03-10 (×7): qty 4
  Filled 2020-03-10: qty 1
  Filled 2020-03-10 (×6): qty 4
  Filled 2020-03-10 (×2): qty 1

## 2020-03-10 MED ORDER — ESCITALOPRAM OXALATE 10 MG PO TABS
10.0000 mg | ORAL_TABLET | Freq: Every day | ORAL | Status: DC
  Administered 2020-03-10 – 2020-03-17 (×8): 10 mg via ORAL
  Filled 2020-03-10 (×9): qty 1

## 2020-03-10 MED ORDER — INSULIN ASPART 100 UNIT/ML ~~LOC~~ SOLN
0.0000 [IU] | Freq: Three times a day (TID) | SUBCUTANEOUS | Status: DC
  Administered 2020-03-10 (×2): 5 [IU] via SUBCUTANEOUS
  Administered 2020-03-10: 8 [IU] via SUBCUTANEOUS
  Administered 2020-03-11: 5 [IU] via SUBCUTANEOUS
  Administered 2020-03-11: 11 [IU] via SUBCUTANEOUS
  Administered 2020-03-11: 5 [IU] via SUBCUTANEOUS
  Administered 2020-03-12 – 2020-03-13 (×3): 3 [IU] via SUBCUTANEOUS
  Administered 2020-03-13: 12:00:00 11 [IU] via SUBCUTANEOUS
  Administered 2020-03-13: 08:00:00 3 [IU] via SUBCUTANEOUS
  Administered 2020-03-14: 08:00:00 2 [IU] via SUBCUTANEOUS
  Administered 2020-03-14: 17:00:00 11 [IU] via SUBCUTANEOUS
  Administered 2020-03-14: 13:00:00 8 [IU] via SUBCUTANEOUS
  Administered 2020-03-15: 08:00:00 5 [IU] via SUBCUTANEOUS
  Administered 2020-03-15: 13:00:00 8 [IU] via SUBCUTANEOUS
  Administered 2020-03-15: 19:00:00 3 [IU] via SUBCUTANEOUS
  Administered 2020-03-16: 12:00:00 5 [IU] via SUBCUTANEOUS
  Administered 2020-03-16: 09:00:00 11 [IU] via SUBCUTANEOUS
  Administered 2020-03-16: 18:00:00 5 [IU] via SUBCUTANEOUS
  Administered 2020-03-17: 13:00:00 8 [IU] via SUBCUTANEOUS
  Administered 2020-03-17: 08:00:00 5 [IU] via SUBCUTANEOUS

## 2020-03-10 MED FILL — Insulin Aspart Inj 100 Unit/ML: SUBCUTANEOUS | Qty: 0.05 | Status: AC

## 2020-03-10 NOTE — ED Notes (Signed)
Patient moved to hospital bed.

## 2020-03-10 NOTE — Progress Notes (Signed)
Patient arrived from ED with transporter in wheelchair.  He does not speak [place], only Burmese.  Used an interpreter:  Patient is alert to self and place (he knows he is in hospital).  He does not know that date or year.  He does not know the city of the state, only that he is in the [place].  He said that his friend brought him to the hospital.  He said that he does not have phone numbers for friends or family and does not want [place] to call anyone for him.  Took vitals, provided him urinal and he nodded he understood what it was.  He said that he was thirsty and would like water. Provided him water.  Through interpreter taught him use of call bell and explained use of bed buttons and shared the bed alarm was on, so he needs to call for assistance.  Patient resting comfortably in bed with call bell in reach.

## 2020-03-10 NOTE — ED Notes (Signed)
Ordered hospital bed--Remer Couse 

## 2020-03-10 NOTE — ED Notes (Signed)
RN gave report to Taylor, Charity fundraiser

## 2020-03-10 NOTE — ED Notes (Signed)
Pt's CBG result was 247. Informed Ladona Ridgel - RN.

## 2020-03-10 NOTE — ED Notes (Signed)
Pt now able to speak with [name] interpreter.  States he was born in [place] but that is it.  He states he does not have a family, does not know where he is.

## 2020-03-10 NOTE — Progress Notes (Signed)
PROGRESS NOTE    Garrett [name]  ZOX:[id number] DOB: [birth year] DOA: 03/09/2020 PCP: Patient, No Pcp Per   Brief Narrative:  HPI on 03/09/2020 by Dr. John Giovanni Jamaul Heist is a 34 y.o. 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  Per triage note, family reported that patient woke up altered this morning and has not been communicating as per usual.  Patient was not able to answer orientation questions appropriately at triage.  Interpreter services used to obtain history as patient speaks [name].  Patient appears confused and oriented to person and place only.  He is not sure why he is here and has no complaints.  Reports history of diabetes but does not know when he was diagnosed and states he is not on any medications.  Denies fevers, cough, shortness of breath, chest pain, nausea, vomiting, abdominal pain, or diarrhea.  No additional history could be obtained from him.  Assessment & Plan   New onset diabetes with DKA -No history of diabetes -Patient presented with altered mental status -Last hemoglobin A1c was 5.0 in March 2019 -On admission blood glucose was 343, bicarb of 20, anion gap of 21, ketones noted on urinalysis, beta hydroxybutyrate acid > 8, VBG showed a pH of 7.36 -Hemoglobin A1c 13.1 -Patient was started on IV insulin per DKA protocol -Has now been transitioned to NovoLog and Lantus  Acute metabolic encephalopathy -Possibly secondary to DKA -The gap is closed and patient has been transitioned to NovoLog and Lantus, he continues to be altered -Patient does speak [name].  Used translator however patient was not responsive to the translator.  Have called friends listed on facesheet to see if they can assist in communication. -CT head unremarkable for acute intercranial normality -Ammonia level within normal  limits -UDS unremarkable, blood alcohol level undetectable -UA showed trace leukocytes, 11-20 WBCs, no bacteria -will continue to monitor closely  Elevated LFTs -AST 46, ALT 97, total bilirubin 1.7 -Hepatitis panel unremarkable -Abdominal ultrasound was normal -Continue to monitor CMP  Abnormal urinalysis -UA showed trace leukocytes, 11-20 WBCs, no bacteria -Currently patient afebrile with no leukocytosis -Urine culture pending  Distended bladder on CT -CT abdomen pelvis showed severely distended urinary bladder with mild bilateral collecting system dilatation. -Patient did void spontaneously in the ED -Continue to monitor and do bladder scans every 4 hours  MDD with psychotic features -Will restart Lexapro, Risperdal, trazodone -Question if this is also adding to patient's acute metabolic encephalopathy as he appears to have ran out of his medications  Hypokalemia -Replaced, continue to monitor  DVT Prophylaxis  Lovenox  Code Status: Full  Family Communication: None at bedside.  Friend via phone  Disposition Plan:  Status is: Inpatient  Remains inpatient appropriate because:Altered mental status   Dispo: The patient is from: Home              Anticipated d/c is to: Home              Anticipated d/c date is: 2 days              Patient currently is not medically stable to d/c.   Consultants None  Procedures  Abd [place]  Antibiotics   Anti-infectives (From admission, onward)   None      Subjective:   Garrett [name] seen and examined today.  Continues to be altered.  Though translator was used, no communication was observed.   Objective:   Vitals:   03/10/20 0500 03/10/20 0600 03/10/20 0700 03/10/20 0800  BP: (!) 134/105 (!) 129/96 (!) 134/108 (!) 131/98  Pulse: 88 81 (!) 103 89  Resp: 16 16 13 13   Temp:      TempSrc:      SpO2: 98% 96% 100% 98%    Intake/Output Summary (Last 24 hours) at 03/10/2020 0906 Last data filed at 03/09/2020 1940 Gross per 24  hour  Intake 1008.33 ml  Output 1100 ml  Net -91.67 ml   There were no vitals filed for this visit.  Exam  General: Well developed, well nourished, NAD, appears stated age  HEENT: NCAT, mucous membranes moist.   Cardiovascular: S1 S2 auscultated, RRR, no murmur  Respiratory: Clear to auscultation bilaterally  Abdomen: Soft, nontender, nondistended, + bowel sounds  Extremities: warm dry without cyanosis clubbing or edema  Neuro: Awake and alert however cannot assess orientation.  Patient does wave hi and bye to me.  Is not speaking or following commands.  Psych: unable to assess   Data Reviewed: I have personally reviewed following labs and imaging studies  CBC: Recent Labs  Lab 03/09/20 1411 03/09/20 1702  WBC 7.6  --   HGB 14.7 14.3  HCT 42.8 42.0  MCV 94.5  --   PLT 270  --    Basic Metabolic Panel: Recent Labs  Lab 03/09/20 1411 03/09/20 1702 03/09/20 1945 03/09/20 2329 03/10/20 0307  NA 139 143 141 144 144  K 4.3 4.4 3.7 3.6 3.3*  CL 98  --  104 105 106  CO2 20*  --  22 26 28   GLUCOSE 343*  --  216* 209* 177*  BUN 12  --  13 10 10   CREATININE 0.83  --  0.85 0.61 0.56*  CALCIUM 10.0  --  9.8 9.9 9.7   GFR: CrCl cannot be calculated (Unknown ideal weight.). Liver Function Tests: Recent Labs  Lab 03/09/20 1411  AST 46*  ALT 97*  ALKPHOS 86  BILITOT 1.7*  PROT 9.1*  ALBUMIN 4.1   No results for input(s): LIPASE, AMYLASE in the last 168 hours. Recent Labs  Lab 03/09/20 1644  AMMONIA 18   Coagulation Profile: No results for input(s): INR, PROTIME in the last 168 hours. Cardiac Enzymes: No results for input(s): CKTOTAL, CKMB, CKMBINDEX, TROPONINI in the last 168 hours. BNP (last 3 results) No results for input(s): PROBNP in the last 8760 hours. HbA1C: Recent Labs    03/09/20 1411  HGBA1C 13.1*   CBG: Recent Labs  Lab 03/09/20 2309 03/10/20 0026 03/10/20 0159 03/10/20 0355 03/10/20 0500  GLUCAP 186* 158* 170* 135* 156*    Lipid Profile: No results for input(s): CHOL, HDL, LDLCALC, TRIG, CHOLHDL, LDLDIRECT in the last 72 hours. Thyroid Function Tests: No results for input(s): TSH, T4TOTAL, FREET4, T3FREE, THYROIDAB in the last 72 hours. Anemia Panel: No results for input(s): VITAMINB12, FOLATE, FERRITIN, TIBC, IRON, RETICCTPCT in the last 72 hours. Urine analysis:    Component Value Date/Time   COLORURINE YELLOW 03/09/2020 1945   APPEARANCEUR CLEAR 03/09/2020 1945   LABSPEC 1.036 (H) 03/09/2020 1945   PHURINE 5.0 03/09/2020 1945   GLUCOSEU >=500 (A) 03/09/2020 1945   HGBUR NEGATIVE 03/09/2020 1945   BILIRUBINUR NEGATIVE 03/09/2020 1945   KETONESUR 80 (A) 03/09/2020 1945   PROTEINUR 30 (A) 03/09/2020 1945   NITRITE NEGATIVE 03/09/2020 1945   LEUKOCYTESUR TRACE (A) 03/09/2020 1945   Sepsis Labs: @  LABRCNTIP(procalcitonin:4,lacticidven:4)  ) Recent Results (from the past 240 hour(s))  Resp Panel by RT-PCR (Flu A&B, Covid) Nasopharyngeal Swab     Status: None   Collection Time: 03/09/20  4:44 PM   Specimen: Nasopharyngeal Swab; Nasopharyngeal(NP) swabs in vial transport medium  Result Value Ref Range Status   SARS Coronavirus 2 by RT PCR NEGATIVE NEGATIVE Final    Comment: (NOTE) SARS-CoV-2 target nucleic acids are NOT DETECTED.  The SARS-CoV-2 RNA is generally detectable in upper respiratory specimens during the acute phase of infection. The lowest concentration of SARS-CoV-2 viral copies this assay can detect is 138 copies/mL. A negative result does not preclude SARS-Cov-2 infection and should not be used as the sole basis for treatment or other patient management decisions. A negative result may occur with  improper specimen collection/handling, submission of specimen other than nasopharyngeal swab, presence of viral mutation(s) within the areas targeted by this assay, and inadequate number of viral copies(<138 copies/mL). A negative result must be combined with clinical observations,  patient history, and epidemiological information. The expected result is Negative.  Fact Sheet for Patients:  BloggerCourse.com  Fact Sheet for Healthcare Providers:  SeriousBroker.it  This test is no t yet approved or cleared by the [place] FDA and  has been authorized for detection and/or diagnosis of SARS-CoV-2 by FDA under an Emergency Use Authorization (EUA). This EUA will remain  in effect (meaning this test can be used) for the duration of the COVID-19 declaration under Section 564(b)(1) of the Act, 21 U.S.C.section 360bbb-3(b)(1), unless the authorization is terminated  or revoked sooner.       Influenza A by PCR NEGATIVE NEGATIVE Final   Influenza B by PCR NEGATIVE NEGATIVE Final    Comment: (NOTE) The Xpert Xpress SARS-CoV-2/FLU/RSV plus assay is intended as an aid in the diagnosis of influenza from Nasopharyngeal swab specimens and should not be used as a sole basis for treatment. Nasal washings and aspirates are unacceptable for Xpert Xpress SARS-CoV-2/FLU/RSV testing.  Fact Sheet for Patients: BloggerCourse.com  Fact Sheet for Healthcare Providers: SeriousBroker.it  This test is not yet approved or cleared by the [place] FDA and has been authorized for detection and/or diagnosis of SARS-CoV-2 by FDA under an Emergency Use Authorization (EUA). This EUA will remain in effect (meaning this test can be used) for the duration of the COVID-19 declaration under Section 564(b)(1) of the Act, 21 U.S.C. section 360bbb-3(b)(1), unless the authorization is terminated or revoked.  Performed at [facility], [address], [place], [place] [address]   Culture, blood (routine x 2)     Status: None (Preliminary result)   Collection Time: 03/09/20  4:44 PM   Specimen: BLOOD  Result Value Ref Range Status   Specimen Description BLOOD SITE NOT SPECIFIED   Final   Special Requests   Final    BOTTLES DRAWN AEROBIC AND ANAEROBIC Blood Culture results may not be optimal due to an inadequate volume of blood received in culture bottles   Culture   Final    NO GROWTH < 24 HOURS Performed at [facility], [address] [place], [place] [address]    Report Status PENDING  Incomplete  Culture, blood (routine x 2)     Status: None (Preliminary result)   Collection Time: 03/09/20  4:55 PM   Specimen: BLOOD  Result Value Ref Range Status   Specimen Description BLOOD SITE NOT SPECIFIED  Final   Special Requests   Final    BOTTLES  DRAWN AEROBIC AND ANAEROBIC Blood Culture results may not be optimal due to an inadequate volume of blood received in culture bottles   Culture   Final    NO GROWTH < 24 HOURS Performed at [facility], [address] [place], [place] [address]    Report Status PENDING  Incomplete      Radiology Studies: DG Chest 2 View  Result Date: 03/09/2020 CLINICAL DATA:  Chest pain.  Altered mental status. EXAM: CHEST - 2 VIEW COMPARISON:  None. FINDINGS: The heart size and mediastinal contours are within normal limits. Both lungs are clear. The visualized skeletal structures are unremarkable. IMPRESSION: Negative two view chest x-ray Electronically Signed   By: Marin Roberts M.D.   On: 03/09/2020 17:37   CT Head Wo Contrast  Result Date: 03/09/2020 CLINICAL DATA:  Mental status change, unknown cause. Patient not communicating as he normally does. EXAM: CT HEAD WITHOUT CONTRAST TECHNIQUE: Contiguous axial images were obtained from the base of the skull through the vertex without intravenous contrast. COMPARISON:  None. FINDINGS: Brain: No acute infarct, hemorrhage, or mass lesion is present. The ventricles are of normal size. No significant extraaxial fluid collection is present. The brainstem and cerebellum are within normal limits. Vascular: No hyperdense vessel or unexpected calcification. Skull: Calvarium is  intact. No focal lytic or blastic lesions are present. No significant extracranial soft tissue lesion is present. Sinuses/Orbits: Posterior right ethmoid air cells are opacified. There is scattered mucosal thickening otherwise. A fluid level is present the right maxillary sinus. There is some mucosal thickening in both maxillary sinuses. Mastoid air cells are clear. The globes and orbits are within normal limits. IMPRESSION: 1. Normal CT appearance of the brain. No acute or focal abnormality to explain mental status changes. 2. Right maxillary sinus disease. Electronically Signed   By: Marin Roberts M.D.   On: 03/09/2020 18:30   CT Abdomen Pelvis W Contrast  Result Date: 03/09/2020 CLINICAL DATA:  Right lower quadrant abdominal pain. EXAM: CT ABDOMEN AND PELVIS WITH CONTRAST TECHNIQUE: Multidetector CT imaging of the abdomen and pelvis was performed using the standard protocol following bolus administration of intravenous contrast. CONTRAST:  OMNIPAQUE IOHEXOL 300 MG/ML  SOLN COMPARISON:  None. FINDINGS: Lower chest: The lung bases are clear. The heart size is normal. Hepatobiliary: The liver is normal. Normal gallbladder.There is no biliary ductal dilation. Pancreas: Normal contours without ductal dilatation. No peripancreatic fluid collection. Spleen: Unremarkable. Adrenals/Urinary Tract: --Adrenal glands: Unremarkable. --Right kidney/ureter: There is mild collecting system dilatation to the level of the urinary bladder. --Left kidney/ureter: There is mild collecting system dilatation to the level of the urinary bladder. --Urinary bladder: The urinary bladder is severely distended. Stomach/Bowel: --Stomach/Duodenum: No hiatal hernia or other gastric abnormality. Normal duodenal course and caliber. --Small bowel: Unremarkable. --Colon: Large of the colon --Appendix: Normal. Vascular/Lymphatic: Atherosclerotic calcification is present within the non-aneurysmal abdominal aorta, without  hemodynamically significant stenosis. --No retroperitoneal lymphadenopathy. --No mesenteric lymphadenopathy. --No pelvic or inguinal lymphadenopathy. Reproductive: Unremarkable Other: No ascites or free air. The abdominal wall is normal. Musculoskeletal. No acute displaced fractures. IMPRESSION: 1. Normal appendix in the right lower quadrant. 2. Severely distended urinary bladder with mild bilateral collecting system dilatation to the level of the urinary bladder. 3. Atherosclerotic changes of the abdominal aorta, much greater than expected for a patient of this age. Aortic Atherosclerosis (ICD10-I70.0). Electronically Signed   By: Katherine Mantle M.D.   On: 03/09/2020 19:09   US Abdomen Limited RUQ (LIVER/GB)  Result  Date: 03/09/2020 CLINICAL DATA:  Elevated LFTs EXAM: ULTRASOUND ABDOMEN LIMITED RIGHT UPPER QUADRANT COMPARISON:  CT earlier in the same day. FINDINGS: Gallbladder: No gallstones or wall thickening visualized. No sonographic Murphy sign noted by sonographer. Common bile duct: Diameter: 4 mm Liver: No focal lesion identified. Within normal limits in parenchymal echogenicity. Portal vein is patent on color Doppler imaging with normal direction of blood flow towards the liver. Other: None. IMPRESSION: Normal study. Electronically Signed   By: Katherine Mantle M.D.   On: 03/09/2020 22:48     Scheduled Meds: . enoxaparin (LOVENOX) injection  40 mg Subcutaneous Q24H  . insulin aspart  0-15 Units Subcutaneous TID WC  . insulin glargine  10 Units Subcutaneous Daily   Continuous Infusions:   LOS: 1 day   Time Spent in minutes   45 minutes  Kaynen Minner D.O. on 03/10/2020 at 9:06 AM  Between 7am to 7pm - Please see pager noted on [email or web address]  After 7pm go to [email or web address]  And look for the night coverage person covering for me after hours  [facility] Office  [phone]

## 2020-03-10 NOTE — Progress Notes (Signed)
   03/10/20 2200  Language Assistant  Interpreter Mode Telephonic Interpreter  Interpreter Name [phone]   Interpreter contacted for initial shift assessment.

## 2020-03-11 ENCOUNTER — Encounter (HOSPITAL_COMMUNITY): Payer: Self-pay | Admitting: Internal Medicine

## 2020-03-11 LAB — HEPATITIS PANEL, ACUTE
HCV Ab: NONREACTIVE
Hep A IgM: NONREACTIVE
Hep B C IgM: NONREACTIVE
Hepatitis B Surface Ag: REACTIVE — AB

## 2020-03-11 LAB — GLUCOSE, CAPILLARY
Glucose-Capillary: 244 mg/dL — ABNORMAL HIGH (ref 70–99)
Glucose-Capillary: 238 mg/dL — ABNORMAL HIGH (ref 70–99)
Glucose-Capillary: 386 mg/dL — ABNORMAL HIGH (ref 70–99)
Glucose-Capillary: 280 mg/dL — ABNORMAL HIGH (ref 70–99)

## 2020-03-11 LAB — COMPREHENSIVE METABOLIC PANEL
ALT: 90 U/L — ABNORMAL HIGH (ref 0–44)
AST: 70 U/L — ABNORMAL HIGH (ref 15–41)
Albumin: 3.3 g/dL — ABNORMAL LOW (ref 3.5–5.0)
Alkaline Phosphatase: 64 U/L (ref 38–126)
Anion gap: 11 (ref 5–15)
BUN: 11 mg/dL (ref 6–20)
CO2: 28 mmol/L (ref 22–32)
Calcium: 9.7 mg/dL (ref 8.9–10.3)
Chloride: 101 mmol/L (ref 98–111)
Creatinine, Ser: 0.75 mg/dL (ref 0.61–1.24)
GFR, Estimated: >60 mL/min (ref >60)
Glucose, Bld: 351 mg/dL — ABNORMAL HIGH (ref 70–99)
Potassium: 3.6 mmol/L (ref 3.5–5.1)
Sodium: 140 mmol/L (ref 135–145)
Total Bilirubin: 1.8 mg/dL — ABNORMAL HIGH (ref 0.3–1.2)
Total Protein: 7 g/dL (ref 6.5–8.1)

## 2020-03-11 LAB — CBC
HCT: 35.7 % — ABNORMAL LOW (ref 39.0–52.0)
Hemoglobin: 12.3 g/dL — ABNORMAL LOW (ref 13.0–17.0)
MCH: 32.8 pg (ref 26.0–34.0)
MCHC: 34.5 g/dL (ref 30.0–36.0)
MCV: 95.2 fL (ref 80.0–100.0)
Platelets: 164 10*3/uL (ref 150–400)
RBC: 3.75 MIL/uL — ABNORMAL LOW (ref 4.22–5.81)
RDW: 11.9 % (ref 11.5–15.5)
WBC: 6.2 10*3/uL (ref 4.0–10.5)
nRBC: 0 % (ref 0.0–0.2)

## 2020-03-11 LAB — HIV ANTIBODY (ROUTINE TESTING W REFLEX): HIV Screen 4th Generation wRfx: NONREACTIVE

## 2020-03-11 MED ORDER — ENSURE ENLIVE PO LIQD
237.0000 mL | Freq: Two times a day (BID) | ORAL | Status: DC
  Administered 2020-03-11 – 2020-03-15 (×9): 237 mL via ORAL

## 2020-03-11 MED ORDER — SODIUM CHLORIDE 0.9 % IV SOLN: INTRAVENOUS | Status: DC

## 2020-03-11 MED ORDER — INSULIN ASPART 100 UNIT/ML ~~LOC~~ SOLN: 3.0000 [IU] | Freq: Three times a day (TID) | SUBCUTANEOUS | Status: DC

## 2020-03-11 MED ORDER — INSULIN ASPART 100 UNIT/ML ~~LOC~~ SOLN
2.0000 [IU] | Freq: Three times a day (TID) | SUBCUTANEOUS | Status: DC
  Administered 2020-03-11 – 2020-03-17 (×16): 2 [IU] via SUBCUTANEOUS

## 2020-03-11 MED ORDER — INSULIN GLARGINE 100 UNIT/ML ~~LOC~~ SOLN
10.0000 [IU] | Freq: Two times a day (BID) | SUBCUTANEOUS | Status: DC
  Administered 2020-03-11 – 2020-03-16 (×11): 10 [IU] via SUBCUTANEOUS
  Filled 2020-03-11 (×12): qty 0.1

## 2020-03-11 MED ORDER — INSULIN GLARGINE 100 UNIT/ML ~~LOC~~ SOLN: 12.0000 [IU] | Freq: Every day | SUBCUTANEOUS | Status: DC

## 2020-03-11 MED ORDER — TAMSULOSIN HCL 0.4 MG PO CAPS
0.4000 mg | ORAL_CAPSULE | Freq: Every day | ORAL | Status: DC
  Administered 2020-03-11 – 2020-03-17 (×7): 0.4 mg via ORAL
  Filled 2020-03-11 (×7): qty 1

## 2020-03-11 NOTE — Progress Notes (Signed)
   03/10/20 2300  Language Assistant  Interpreter Mode Telephonic Interpreter  Interpreter Name [phone]   Interpreter contacted via iPad to explain intermittent catheterization procedure.

## 2020-03-11 NOTE — Progress Notes (Signed)
Patient was very withdrawn during shift.  Patient would not acknowledge RN or healthcare team when spoken to.  Patient would interact with interpreter using one or two word answers.  Patient would speak to interpreter in hushed whispers, often prompting the interpreter to ask him to repeat himself.  Patient observed looking around room, as if looking for something or watching something moving around room.  When asked by interpreter, "what are you looking at" and "did you looking for something in your room," patient refused to answer. Patient refused dinner, but did eat 1 wrapped bread roll when hand-offered by RN, and fruit cup when handed to him.

## 2020-03-11 NOTE — Progress Notes (Signed)
   03/11/20 1723  Assess: MEWS Score  Temp 97.7 F (36.5 C)  BP 112/77  Pulse Rate (!) 125  ECG Heart Rate (!) 123  Resp 12  SpO2 96 %  Assess: MEWS Score  MEWS Temp 0  MEWS Systolic 0  MEWS Pulse 2  MEWS RR 1  MEWS LOC 0  MEWS Score 3  MEWS Score Color Yellow  Assess: if the MEWS score is Yellow or Red  Were vital signs taken at a resting state? Yes  Focused Assessment No change from prior assessment  Early Detection of Sepsis Score *See Row Information* Low  MEWS guidelines implemented *See Row Information* Yes  Treat  MEWS Interventions Escalated (See documentation below)  Take Vital Signs  Increase Vital Sign Frequency  Yellow: Q 2hr X 2 then Q 4hr X 2, if remains yellow, continue Q 4hrs  Escalate  MEWS: Escalate Yellow: discuss with charge nurse/RN and consider discussing with provider and RRT  Notify: Charge Nurse/RN  Name of Charge Nurse/RN Notified sarah  Date Charge Nurse/RN Notified 03/11/20  Time Charge Nurse/RN Notified 1725  Document  Patient Outcome Stabilized after interventions  Progress note created (see row info) Yes

## 2020-03-11 NOTE — Progress Notes (Signed)
Inpatient Diabetes Program Recommendations  AACE/ADA: New Consensus Statement on Inpatient Glycemic Control (2015)  Target Ranges:  Prepandial:   less than 140 mg/dL      Peak postprandial:   less than 180 mg/dL (1-2 hours)      Critically ill patients:  140 - 180 mg/dL   Lab Results  Component Value Date   GLUCAP 238 (H) 03/11/2020   HGBA1C 13.1 (H) 03/09/2020    Review of Glycemic Control Results for Garrett [name], Garrett [name] (MRN [id number]) as of 03/11/2020 13:20  Ref. Range 03/10/2020 20:53 03/11/2020 07:37 03/11/2020 12:21  Glucose-Capillary Latest Ref Range: 70 - 99 mg/dL 213 (H) 086 (H) 578 (H)   Diabetes history: New onset Outpatient Diabetes medications: none Current orders for Inpatient glycemic control: Lantus 10 units QD, Novolog 0-15 units TID  Inpatient Diabetes Program Recommendations:    Consider increasing Lantus to 10 units BID and adding Novolog 2 units TID (assuming patient consuming >50% of meal).   At bedside using Garrett [name], interpreter. Patient not appropriate for education today. Will re attempt once mental status improves. Spoke with RN to verify patient's mental status. Per RN, patient is answering in one word phrases and responses. Difficult to ascertain comprehension. Plans to continue working with patient on self injection and checking CBGs as appropriate.   Following.   Thanks, Garrett Rave, MSN, RNC-OB Diabetes Coordinator [phone] (8a-5p)

## 2020-03-11 NOTE — Progress Notes (Signed)
Pt has had three straight caths due to urinary rentention over the last 24 hours. Message sent to Dr. Loney Loh to obtain order for foley. Will continue to monitor

## 2020-03-11 NOTE — Plan of Care (Signed)
  Problem: Education: Goal: Knowledge of General Education information will improve Description: Including pain rating scale, medication(s)/side effects and non-pharmacologic comfort measures Outcome: Not Progressing Note: Language barrier   Problem: Health Behavior/Discharge Planning: Goal: Ability to manage health-related needs will improve Outcome: Not Progressing Note: Language barrier; patient does not interact with healthcare staff    Problem: Clinical Measurements: Goal: Ability to maintain clinical measurements within normal limits will improve Outcome: Progressing Goal: Will remain free from infection Outcome: Not Progressing Note: Patient has current infection Goal: Diagnostic test results will improve Outcome: Progressing Goal: Respiratory complications will improve Outcome: Progressing Goal: Cardiovascular complication will be avoided Outcome: Progressing   Problem: Activity: Goal: Risk for activity intolerance will decrease Outcome: Not Progressing   Problem: Nutrition: Goal: Adequate nutrition will be maintained Outcome: Not Progressing Note: No progress being made towards goals; patient has poor intake.  Patient will eat if handed food items.   Problem: Coping: Goal: Level of anxiety will decrease Outcome: Progressing Note: Patient is very withdrawn.   Problem: Elimination: Goal: Will not experience complications related to bowel motility Outcome: Progressing Goal: Will not experience complications related to urinary retention Outcome: Not Progressing Note: Patient unable to void, patient was I&O cathed.   Problem: Pain Managment: Goal: General experience of comfort will improve Outcome: Progressing   Problem: Safety: Goal: Ability to remain free from injury will improve Outcome: Progressing   Problem: Skin Integrity: Goal: Risk for impaired skin integrity will decrease Outcome: Progressing

## 2020-03-11 NOTE — TOC Initial Note (Addendum)
Transition of Care [facility]) - Initial/Assessment Note    Patient Details  Name: Garrett [name] MRN: [id number] Date of Birth: 01-12-1986  Transition of Care [facility] CM/SW Contact:    Lockie Pares, RN Phone Number: 03/11/2020, 3:14 PM  Clinical Narrative:                  Patent admitted for encephalopathy, new dx of DM in DKA. Off insulin Drip, history of Psychiatric issues, lives with friends, unsure if family in the States, Has Medicaid Between.  Needs Psychiatric Workup once Medically cleared as still not responding appropriately. Looks like medically DC plan over weekend.If starting on insulin, will need to be taught and have a back up with friend. He had stated earlier that he didn't want anyone to get information. . If he did need Psychiatric intervention post medical   , a TTS consult would be of value to assess  Any  [facility] issues that need addressed if medically cleared and still having issues with AMS  1530 Chatted with RN , who feels the patent could do with a Psych eval as well, and suggested to MD the possibility of psych evaluation.    Expected Discharge Plan: Psychiatric Hospital?Home? Barriers to Discharge: Continued Medical Work up   Patient Goals and CMS Choice        Expected Discharge Plan and Services Expected Discharge Plan: Psychiatric Hospital       Living arrangements for the past 2 months: Apartment                                      Prior Living Arrangements/Services Living arrangements for the past 2 months: Apartment Lives with:: Friends Patient language and need for interpreter reviewed:: Yes (Burmeese)        Need for Family Participation in Patient Care: Yes (Comment) Care giver support system in place?:  (has friends)   Criminal Activity/Legal Involvement Pertinent to Current Situation/Hospitalization: No - Comment as needed  Activities of Daily Living Home Assistive Devices/Equipment: None ADL Screening (condition at time of  admission) Patient's cognitive ability adequate to safely complete daily activities?: Yes Is the patient deaf or have difficulty hearing?: No Does the patient have difficulty seeing, even when wearing glasses/contacts?: No Does the patient have difficulty concentrating, remembering, or making decisions?: Yes Patient able to express need for assistance with ADLs?: No Does the patient have difficulty dressing or bathing?: No Independently performs ADLs?: Yes (appropriate for developmental age) Does the patient have difficulty walking or climbing stairs?: No Weakness of Legs: None Weakness of Arms/Hands: None  Permission Sought/Granted                  Emotional Assessment       Orientation: : Oriented to Self,Fluctuating Orientation (Suspected and/or reported Sundowners) Alcohol / Substance Use: Not Applicable Psych Involvement: Yes (comment)  Admission diagnosis:  DKA (diabetic ketoacidosis) (HCC) [E11.10] Elevated LFTs [R79.89] Diabetes mellitus, new onset (HCC) [E11.9] Altered mental status, unspecified altered mental status type [R41.82] Acute metabolic encephalopathy [G93.41] Patient Active Problem List   Diagnosis Date Noted  . DKA (diabetic ketoacidosis) (HCC) 03/09/2020  . Diabetes mellitus, new onset (HCC) 03/09/2020  . Acute metabolic encephalopathy 03/09/2020  . Elevated LFTs 03/09/2020  . Major depressive disorder, recurrent severe without psychotic features (HCC) 06/20/2017  . MDD (major depressive disorder), recurrent, severe, with psychosis (HCC) 06/19/2017   PCP:  Patient, No  Pcp Per Pharmacy:   [id number] [place], [place] - [address] [place] [address] Phone: [phone] Fax: [phone]     Social Determinants of Health (SDOH) Interventions    Readmission Risk Interventions No flowsheet data found.

## 2020-03-11 NOTE — Progress Notes (Signed)
Initial Nutrition Assessment  DOCUMENTATION CODES:   Not applicable  INTERVENTION:  Continue Ensure Enlive po BID, each supplement provides 350 kcal and 20 grams of protein.  Encourage adequate PO intake.   NUTRITION DIAGNOSIS:   Inadequate oral intake related to  (AMS) as evidenced by meal completion < 25%.  GOAL:   Patient will meet greater than or equal to 90% of their needs  MONITOR:   PO intake,Supplement acceptance,Skin,Weight trends,Labs,I & O's  REASON FOR ASSESSMENT:   Malnutrition Screening Tool    ASSESSMENT:   34 year old male presents with AMS and found to have new onset diabetes with DKA.  Pt with altered mental status. RD unable to obtain pt nutrition history at this time. Per MD, mental status is slightly improving. Pt speaks Brewing technologist. Pt with poor po per RN. Meal completion 25%. Pt currently has Ensure ordered and has been consuming them. RD to continue with current orders to aid in caloric and protein needs. Pt encouraged to eat his food at meals and drink his supplements. Unable to complete Nutrition-Focused physical exam at this time.   Labs and medications reviewed.   Diet Order:   Diet Order            Diet Carb Modified Fluid consistency: Thin; Room service appropriate? Yes  Diet effective now                 EDUCATION NEEDS:   Not appropriate for education at this time  Skin:  Skin Assessment: Reviewed RN Assessment  Last BM:  Unknown  Height:   Ht Readings from Last 1 Encounters:  03/11/20 5\' 4"  (1.626 m)    Weight:   Wt Readings from Last 1 Encounters:  03/11/20 54.2 kg   BMI:  Body mass index is 20.51 kg/m.  Estimated Nutritional Needs:   Kcal:  1800-2000  Protein:  85-100 grams  Fluid:  >/= 1.8 L/day  14/09/21, MS, RD, LDN RD pager number/after hours weekend pager number on Amion.

## 2020-03-11 NOTE — Progress Notes (Signed)
   03/10/20 2357  Provider Notification  Provider Name/Title [facility]  Date Provider Notified 03/10/20  Time Provider Notified 2357  Notification Type Page  Notification Reason Other (Comment) (drained 1125 mL out of bladder)  Response See new orders   I&O cath performed.  Large amounts of white, thick, purulent drainage noted from urethra.  1125 mL drained with intermittent cath with some that spilled out of cath tray.  Catheter removed with additional urine remaining in bladder.  Provider has been notified.

## 2020-03-11 NOTE — Progress Notes (Signed)
PROGRESS NOTE    Garrett [name]  WUJ:[id number] DOB: 10/20/1985 DOA: 03/09/2020 PCP: Patient, No Pcp Per   Brief Narrative:  HPI on 03/09/2020 by Dr. John Giovanni Braden [name] is a 34 y.o. 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  Per triage note, family reported that patient woke up altered this morning and has not been communicating as per usual.  Patient was not able to answer orientation questions appropriately at triage.  Interpreter services used to obtain history as patient speaks [name].  Patient appears confused and oriented to person and place only.  He is not sure why he is here and has no complaints.  Reports history of diabetes but does not know when he was diagnosed and states he is not on any medications.  Denies fevers, cough, shortness of breath, chest pain, nausea, vomiting, abdominal pain, or diarrhea.  No additional history could be obtained from him.  Interim history Patient admitted with new onset diabetes found to be in DKA.  Has been transitioned off of insulin drip and onto Lantus and NovoLog.  Patient also with acute metabolic encephalopathy which appears to be improving slowly.  Assessment & Plan   New onset diabetes with DKA -No history of diabetes -Patient presented with altered mental status -Last hemoglobin A1c was 5.0 in March 2019 -On admission blood glucose was 343, bicarb of 20, anion gap of 21, ketones noted on urinalysis, beta hydroxybutyrate acid > 8, VBG showed a pH of 7.36 -Hemoglobin A1c 13.1 -Patient was started on IV insulin per DKA protocol -Has now been transitioned to NovoLog and Lantus -have tried to explain this to the patient using interpreter however patient had no questions  Acute metabolic encephalopathy -Possibly secondary to DKA -The gap is closed and patient has been  transitioned to NovoLog and Lantus, he continues to be altered -CT head unremarkable for acute intercranial normality -Ammonia level within normal limits -UDS unremarkable, blood alcohol level undetectable -UA showed trace leukocytes, 11-20 WBCs, no bacteria -Pending urine culture -Appears to be improving slightly, patient currently alert and oriented x3 -will continue to monitor closely  Elevated LFTs -AST 46, ALT 97, total bilirubin 1.7 on admission -Hepatitis panel unremarkable -Abdominal ultrasound was normal -Continue to monitor CMP  Abnormal urinalysis -UA showed trace leukocytes, 11-20 WBCs, no bacteria -Currently patient afebrile with no leukocytosis -Urine culture pending -Overnight, patient was noted to have purulent drainage from his urethra.  Have ordered GNC, HIV.   Urinary retention -Overnight, patient had issues with urinary retention noted on bladder scan, 581 cc -Continue in and out, if needed will place Foley catheter -will start patient on Flomax  Distended bladder on CT -CT abdomen pelvis showed severely distended urinary bladder with mild bilateral collecting system dilatation. -Patient did void spontaneously in the ED -Continue to monitor and do bladder scans every 4 hours  MDD with psychotic features -Continue Lexapro, Risperdal, trazodone -Question if this is also adding to patient's acute metabolic encephalopathy as he appears to have ran out of his medications  Hypokalemia -Resolved with replacement however currently 3.6 -We will give additional dose -Monitor BMP  DVT Prophylaxis  Lovenox  Code Status: Full  Family Communication: None at bedside.  Friend via phone  Disposition Plan:  Status is: Inpatient  Remains inpatient appropriate because:Altered mental status   Dispo: The patient is from: Home  Anticipated d/c is to: Home              Anticipated d/c date is: 2 days              Patient currently is not medically stable  to d/c.   Consultants None  Procedures  Abd [place]  Antibiotics   Anti-infectives (From admission, onward)   None      Subjective:   Dois [name] seen and examined today.  Minimally conversant.  Used Nurse, learning disability for communication.  Patient with no questions or complaints.  Denies chest pain or shortness of breath, abdominal pain.    Objective:   Vitals:   03/11/20 0400 03/11/20 0800 03/11/20 1050 03/11/20 1200  BP: (!) 120/94 (!) 121/96 (!) 121/96 (!) 119/96  Pulse: 97 94 94 (!) 102  Resp: 15 15 15 16   Temp: 97.9 F (36.6 C) 98.6 F (37 C) 98.6 F (37 C) 98 F (36.7 C)  TempSrc: Oral Oral Oral Oral  SpO2: 98% 97%  95%  Weight:   54.2 kg   Height:   5\' 4"  (1.626 m)     Intake/Output Summary (Last 24 hours) at 03/11/2020 1236 Last data filed at 03/11/2020 14/12/2019 Gross per 24 hour  Intake 45.74 ml  Output 2900 ml  Net -2854.26 ml   Filed Weights   03/11/20 1050  Weight: 54.2 kg   Exam  General: Well developed, chronically ill-appearing, NAD, older than stated age  HEENT: NCAT, mucous membranes moist.  Poor dentition  Cardiovascular: S1 S2 auscultated, RRR  Respiratory: Clear to auscultation bilaterally   Abdomen: Soft, nontender, nondistended, + bowel sounds  Extremities: warm dry without cyanosis clubbing or edema  Neuro: AAOx3 (self, place, year), nonfocal  Psych: flat   Data Reviewed: I have personally reviewed following labs and imaging studies  CBC: Recent Labs  Lab 03/09/20 1411 03/09/20 1702 03/11/20 0107  WBC 7.6  --  6.2  HGB 14.7 14.3 12.3*  HCT 42.8 42.0 35.7*  MCV 94.5  --  95.2  PLT 270  --  164   Basic Metabolic Panel: Recent Labs  Lab 03/09/20 1411 03/09/20 1702 03/09/20 1945 03/09/20 2329 03/10/20 0307 03/11/20 0107  NA 139 143 141 144 144 140  K 4.3 4.4 3.7 3.6 3.3* 3.6  CL 98  --  104 105 106 101  CO2 20*  --  22 26 28 28   GLUCOSE 343*  --  216* 209* 177* 351*  BUN 12  --  13 10 10 11   CREATININE 0.83  --  0.85 0.61  0.56* 0.75  CALCIUM 10.0  --  9.8 9.9 9.7 9.7   GFR: Estimated Creatinine Clearance: 97.9 mL/min (by C-G formula based on SCr of 0.75 mg/dL). Liver Function Tests: Recent Labs  Lab 03/09/20 1411 03/11/20 0107  AST 46* 70*  ALT 97* 90*  ALKPHOS 86 64  BILITOT 1.7* 1.8*  PROT 9.1* 7.0  ALBUMIN 4.1 3.3*   No results for input(s): LIPASE, AMYLASE in the last 168 hours. Recent Labs  Lab 03/09/20 1644  AMMONIA 18   Coagulation Profile: No results for input(s): INR, PROTIME in the last 168 hours. Cardiac Enzymes: No results for input(s): CKTOTAL, CKMB, CKMBINDEX, TROPONINI in the last 168 hours. BNP (last 3 results) No results for input(s): PROBNP in the last 8760 hours. HbA1C: Recent Labs    03/09/20 1411  HGBA1C 13.1*   CBG: Recent Labs  Lab 03/10/20 1234 03/10/20 1732 03/10/20 2053 03/11/20 0737 03/11/20 1221  GLUCAP 207* 291* 192* 244* 238*   Lipid Profile: No results for input(s): CHOL, HDL, LDLCALC, TRIG, CHOLHDL, LDLDIRECT in the last 72 hours. Thyroid Function Tests: No results for input(s): TSH, T4TOTAL, FREET4, T3FREE, THYROIDAB in the last 72 hours. Anemia Panel: No results for input(s): VITAMINB12, FOLATE, FERRITIN, TIBC, IRON, RETICCTPCT in the last 72 hours. Urine analysis:    Component Value Date/Time   COLORURINE YELLOW 03/09/2020 1945   APPEARANCEUR CLEAR 03/09/2020 1945   LABSPEC 1.036 (H) 03/09/2020 1945   PHURINE 5.0 03/09/2020 1945   GLUCOSEU >=500 (A) 03/09/2020 1945   HGBUR NEGATIVE 03/09/2020 1945   BILIRUBINUR NEGATIVE 03/09/2020 1945   KETONESUR 80 (A) 03/09/2020 1945   PROTEINUR 30 (A) 03/09/2020 1945   NITRITE NEGATIVE 03/09/2020 1945   LEUKOCYTESUR TRACE (A) 03/09/2020 1945   Sepsis Labs: @LABRCNTIP (procalcitonin:4,lacticidven:4)  ) Recent Results (from the past 240 hour(s))  Resp Panel by RT-PCR (Flu A&B, Covid) Nasopharyngeal Swab     Status: None   Collection Time: 03/09/20  4:44 PM   Specimen: Nasopharyngeal Swab;  Nasopharyngeal(NP) swabs in vial transport medium  Result Value Ref Range Status   SARS Coronavirus 2 by RT PCR NEGATIVE NEGATIVE Final    Comment: (NOTE) SARS-CoV-2 target nucleic acids are NOT DETECTED.  The SARS-CoV-2 RNA is generally detectable in upper respiratory specimens during the acute phase of infection. The lowest concentration of SARS-CoV-2 viral copies this assay can detect is 138 copies/mL. A negative result does not preclude SARS-Cov-2 infection and should not be used as the sole basis for treatment or other patient management decisions. A negative result may occur with  improper specimen collection/handling, submission of specimen other than nasopharyngeal swab, presence of viral mutation(s) within the areas targeted by this assay, and inadequate number of viral copies(<138 copies/mL). A negative result must be combined with clinical observations, patient history, and epidemiological information. The expected result is Negative.  Fact Sheet for Patients:  14/07/21  Fact Sheet for Healthcare Providers:  BloggerCourse.com  This test is no t yet approved or cleared by the [employer]ious[place]ker.[place] FDA and  has been authorized for detection and/or diagnosis of SARS-CoV-2 by FDA under an Emergency Use Authorization (EUA). This EUA will remain  in effect (meaning this test can be used) for the duration of the COVID-19 declaration under Section 564(b)(1) of the Act, 21 U.S.C.section 360bbb-3(b)(1), unless the authorization is terminated  or revoked sooner.       Influenza A by PCR NEGATIVE NEGATIVE Final   Influenza B by PCR NEGATIVE NEGATIVE Final    Comment: (NOTE) The Xpert Xpress SARS-CoV-2/FLU/RSV plus assay is intended as an aid in the diagnosis of influenza from Nasopharyngeal swab specimens and should not be used as a sole basis for treatment. Nasal washings and aspirates are unacceptable for Xpert Xpress  SARS-CoV-2/FLU/RSV testing.  Fact Sheet for Patients: [place]  Fact Sheet for Healthcare Providers: BloggerCourse.com  This test is not yet approved or cleared by the SeriousBroker.it FDA and has been authorized for detection and/or diagnosis of SARS-CoV-2 by FDA under an Emergency Use Authorization (EUA). This EUA will remain in effect (meaning this test can be used) for the duration of the COVID-19 declaration under Section 564(b)(1) of the Act, 21 U.S.C. section 360bbb-3(b)(1), unless the authorization is terminated or revoked.  Performed at [facility], [address] [place], [place]   Culture, blood (routine x 2)     Status: None (Preliminary result)   Collection Time: 03/09/20  4:44  PM   Specimen: BLOOD  Result Value Ref Range Status   Specimen Description BLOOD SITE NOT SPECIFIED  Final   Special Requests   Final    BOTTLES DRAWN AEROBIC AND ANAEROBIC Blood Culture results may not be optimal due to an inadequate volume of blood received in culture bottles   Culture   Final    NO GROWTH 2 DAYS Performed at [facility], [address] [place], [place] [address]    Report Status PENDING  Incomplete  Culture, blood (routine x 2)     Status: None (Preliminary result)   Collection Time: 03/09/20  4:55 PM   Specimen: BLOOD  Result Value Ref Range Status   Specimen Description BLOOD SITE NOT SPECIFIED  Final   Special Requests   Final    BOTTLES DRAWN AEROBIC AND ANAEROBIC Blood Culture results may not be optimal due to an inadequate volume of blood received in culture bottles   Culture   Final    NO GROWTH 2 DAYS Performed at [facility], [address] [place], [place] [address]    Report Status PENDING  Incomplete      Radiology Studies: DG Chest 2 View  Result Date: 03/09/2020 CLINICAL DATA:  Chest pain.  Altered mental status. EXAM: CHEST - 2 VIEW COMPARISON:  None.  FINDINGS: The heart size and mediastinal contours are within normal limits. Both lungs are clear. The visualized skeletal structures are unremarkable. IMPRESSION: Negative two view chest x-ray Electronically Signed   By: Marin Robertshristopher  Mattern M.D.   On: 03/09/2020 17:37   CT Head Wo Contrast  Result Date: 03/09/2020 CLINICAL DATA:  Mental status change, unknown cause. Patient not communicating as he normally does. EXAM: CT HEAD WITHOUT CONTRAST TECHNIQUE: Contiguous axial images were obtained from the base of the skull through the vertex without intravenous contrast. COMPARISON:  None. FINDINGS: Brain: No acute infarct, hemorrhage, or mass lesion is present. The ventricles are of normal size. No significant extraaxial fluid collection is present. The brainstem and cerebellum are within normal limits. Vascular: No hyperdense vessel or unexpected calcification. Skull: Calvarium is intact. No focal lytic or blastic lesions are present. No significant extracranial soft tissue lesion is present. Sinuses/Orbits: Posterior right ethmoid air cells are opacified. There is scattered mucosal thickening otherwise. A fluid level is present the right maxillary sinus. There is some mucosal thickening in both maxillary sinuses. Mastoid air cells are clear. The globes and orbits are within normal limits. IMPRESSION: 1. Normal CT appearance of the brain. No acute or focal abnormality to explain mental status changes. 2. Right maxillary sinus disease. Electronically Signed   By: Marin Robertshristopher  Mattern M.D.   On: 03/09/2020 18:30   CT Abdomen Pelvis W Contrast  Result Date: 03/09/2020 CLINICAL DATA:  Right lower quadrant abdominal pain. EXAM: CT ABDOMEN AND PELVIS WITH CONTRAST TECHNIQUE: Multidetector CT imaging of the abdomen and pelvis was performed using the standard protocol following bolus administration of intravenous contrast. CONTRAST:  100mL OMNIPAQUE IOHEXOL 300 MG/ML  SOLN COMPARISON:  None. FINDINGS: Lower chest: The  lung bases are clear. The heart size is normal. Hepatobiliary: The liver is normal. Normal gallbladder.There is no biliary ductal dilation. Pancreas: Normal contours without ductal dilatation. No peripancreatic fluid collection. Spleen: Unremarkable. Adrenals/Urinary Tract: --Adrenal glands: Unremarkable. --Right kidney/ureter: There is mild collecting system dilatation to the level of the urinary bladder. --Left kidney/ureter: There is mild collecting system dilatation to the level of the urinary bladder. --Urinary bladder: The urinary bladder  is severely distended. Stomach/Bowel: --Stomach/Duodenum: No hiatal hernia or other gastric abnormality. Normal duodenal course and caliber. --Small bowel: Unremarkable. --Colon: Large of the colon --Appendix: Normal. Vascular/Lymphatic: Atherosclerotic calcification is present within the non-aneurysmal abdominal aorta, without hemodynamically significant stenosis. --No retroperitoneal lymphadenopathy. --No mesenteric lymphadenopathy. --No pelvic or inguinal lymphadenopathy. Reproductive: Unremarkable Other: No ascites or free air. The abdominal wall is normal. Musculoskeletal. No acute displaced fractures. IMPRESSION: 1. Normal appendix in the right lower quadrant. 2. Severely distended urinary bladder with mild bilateral collecting system dilatation to the level of the urinary bladder. 3. Atherosclerotic changes of the abdominal aorta, much greater than expected for a patient of this age. Aortic Atherosclerosis (ICD10-I70.0). Electronically Signed   By: Katherine Mantle M.D.   On: 03/09/2020 19:09   US Abdomen Limited RUQ (LIVER/GB)  Result Date: 03/09/2020 CLINICAL DATA:  Elevated LFTs EXAM: ULTRASOUND ABDOMEN LIMITED RIGHT UPPER QUADRANT COMPARISON:  CT earlier in the same day. FINDINGS: Gallbladder: No gallstones or wall thickening visualized. No sonographic Murphy sign noted by sonographer. Common bile duct: Diameter: 4 mm Liver: No focal lesion identified. Within  normal limits in parenchymal echogenicity. Portal vein is patent on color Doppler imaging with normal direction of blood flow towards the liver. Other: None. IMPRESSION: Normal study. Electronically Signed   By: Katherine Mantle M.D.   On: 03/09/2020 22:48     Scheduled Meds: . enoxaparin (LOVENOX) injection  40 mg Subcutaneous Q24H  . escitalopram  10 mg Oral Daily  . feeding supplement  237 mL Oral BID BM  . insulin aspart  0-15 Units Subcutaneous TID WC  . insulin glargine  10 Units Subcutaneous Daily  . risperiDONE  2 mg Oral BID   Continuous Infusions:   LOS: 2 days   Time Spent in minutes   45 minutes  Debbe Crumble D.O. on 03/11/2020 at 12:36 PM  Between 7am to 7pm - Please see pager noted on [email or web address]  After 7pm go to [email or web address]  And look for the night coverage person covering for me after hours  [facility] Office  [phone]

## 2020-03-12 ENCOUNTER — Inpatient Hospital Stay (HOSPITAL_COMMUNITY): Payer: BC Managed Care – PPO

## 2020-03-12 DIAGNOSIS — G9341 Metabolic encephalopathy: Secondary | ICD-10-CM

## 2020-03-12 LAB — COMPREHENSIVE METABOLIC PANEL
ALT: 91 U/L — ABNORMAL HIGH (ref 0–44)
AST: 51 U/L — ABNORMAL HIGH (ref 15–41)
Albumin: 3.1 g/dL — ABNORMAL LOW (ref 3.5–5.0)
Alkaline Phosphatase: 61 U/L (ref 38–126)
Anion gap: 9 (ref 5–15)
BUN: 10 mg/dL (ref 6–20)
CO2: 28 mmol/L (ref 22–32)
Calcium: 9 mg/dL (ref 8.9–10.3)
Chloride: 106 mmol/L (ref 98–111)
Creatinine, Ser: 0.55 mg/dL — ABNORMAL LOW (ref 0.61–1.24)
GFR, Estimated: >60 mL/min (ref >60)
Glucose, Bld: 163 mg/dL — ABNORMAL HIGH (ref 70–99)
Potassium: 3.3 mmol/L — ABNORMAL LOW (ref 3.5–5.1)
Sodium: 143 mmol/L (ref 135–145)
Total Bilirubin: 1.4 mg/dL — ABNORMAL HIGH (ref 0.3–1.2)
Total Protein: 6.7 g/dL (ref 6.5–8.1)

## 2020-03-12 LAB — URINE CULTURE: Culture: NO GROWTH

## 2020-03-12 LAB — GLUCOSE, CAPILLARY
Glucose-Capillary: 160 mg/dL — ABNORMAL HIGH (ref 70–99)
Glucose-Capillary: 172 mg/dL — ABNORMAL HIGH (ref 70–99)
Glucose-Capillary: 188 mg/dL — ABNORMAL HIGH (ref 70–99)
Glucose-Capillary: 252 mg/dL — ABNORMAL HIGH (ref 70–99)
Glucose-Capillary: 97 mg/dL (ref 70–99)

## 2020-03-12 LAB — TSH: TSH: 2.181 u[IU]/mL (ref 0.350–4.500)

## 2020-03-12 LAB — VITAMIN B12: Vitamin B-12: 1187 pg/mL — ABNORMAL HIGH (ref 180–914)

## 2020-03-12 LAB — FOLATE: Folate: 21.1 ng/mL (ref >5.9)

## 2020-03-12 LAB — MAGNESIUM: Magnesium: 1.8 mg/dL (ref 1.7–2.4)

## 2020-03-12 MED ORDER — THIAMINE HCL 100 MG/ML IJ SOLN: 100.0000 mg | Freq: Every day | INTRAMUSCULAR | Status: DC

## 2020-03-12 MED ORDER — THIAMINE HCL 100 MG PO TABS
100.0000 mg | ORAL_TABLET | Freq: Every day | ORAL | Status: DC
  Administered 2020-03-13 – 2020-03-17 (×5): 100 mg via ORAL
  Filled 2020-03-12 (×6): qty 1

## 2020-03-12 MED ORDER — POTASSIUM CHLORIDE CRYS ER 20 MEQ PO TBCR
40.0000 meq | EXTENDED_RELEASE_TABLET | Freq: Once | ORAL | Status: DC
  Filled 2020-03-12: qty 2

## 2020-03-12 MED ORDER — LORAZEPAM 0.5 MG PO TABS
0.5000 mg | ORAL_TABLET | Freq: Two times a day (BID) | ORAL | Status: DC
  Administered 2020-03-13 – 2020-03-17 (×9): 0.5 mg via ORAL
  Filled 2020-03-12 (×10): qty 1

## 2020-03-12 MED ORDER — CHLORHEXIDINE GLUCONATE CLOTH 2 % EX PADS
6.0000 | MEDICATED_PAD | Freq: Every day | CUTANEOUS | Status: DC
  Administered 2020-03-12 – 2020-03-14 (×3): 6 via TOPICAL

## 2020-03-12 NOTE — Consult Note (Signed)
  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 Per triage note, family reported that patient woke up altered this morning and has not been communicating as per usual. Patient was not able to answer orientation questions appropriately at triage. Interpreter services used to obtain history as patient speaks [name].Patient appears confused and oriented to person and place only. He is not sure why he is here and has no complaints. Reports history of diabetes but does not know when he was diagnosed and states he is not on any medications. Denies fevers, cough, shortness of breath, chest pain, nausea, vomiting, abdominal pain, or diarrhea. No additional history could be obtained from him.  Psych consult place for paranoia.  Patient presented with altered mental status, and new onset DKA.  DKA has improved but patient is not very interactive.  He has been on many psych meds in the past.  Unable to complete assessment at this time as patient has been not communicating with staff in addition to interpreter.  Per chart review most recent admission in 2019 to [facility] he was diagnosed with depression with severe psychosis after an extended length of stay of about 15 days.  At that time of the admission he was treated with Lexapro 10 mg p.o. daily, lorazepam 0.5 mg p.o. twice daily for anxiety, risperidone 2 mg p.o. twice daily for mood and psychosis, and trazodone 100 mg p.o. nightly as needed for insomnia.  All of these medications which have been resumed, it is unclear if patient was compliant prior to this hospital admission.  Patient will likely taken additional time to respond to risperidone, however this is a good antipsychotic of choice for his current state of psychosis.  Although going forward patient  diagnosis of diabetes, will need to consider changing to a different antipsychotic to help manage tighter glucose control.  Will add Ativan 0.5 mg p.o. twice daily in the event patient is developing some catatonia.  Ultimately once he is medically stable he will require inpatient admission to a psychiatric hospital.  -We will continue home medications.  Will likely benefit from a different antipsychotic once he is stable, due to new diagnosis of diabetes. -We will start Ativan 0.5 mg p.o. twice daily -Will recommend inpatient admission to psychiatric hospital once patient is medically cleared. -Psychiatry to sign off at this time please reconsult once patient is able to participate in evaluation.

## 2020-03-12 NOTE — Progress Notes (Signed)
PROGRESS NOTE    Garrett [name]  NWG:[id number] DOB: 09-07-1985 DOA: 03/09/2020 PCP: Patient, No Pcp Per   Brief Narrative:  HPI on 03/09/2020 by Dr. John Giovanni Garrett [name] is a 34 y.o. 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  Per triage note, family reported that patient woke up altered this morning and has not been communicating as per usual.  Patient was not able to answer orientation questions appropriately at triage.  Interpreter services used to obtain history as patient speaks [name].  Patient appears confused and oriented to person and place only.  He is not sure why he is here and has no complaints.  Reports history of diabetes but does not know when he was diagnosed and states he is not on any medications.  Denies fevers, cough, shortness of breath, chest pain, nausea, vomiting, abdominal pain, or diarrhea.  No additional history could be obtained from him.  Interim history Patient admitted with new onset diabetes found to be in DKA.  Has been transitioned off of insulin drip and onto Lantus and NovoLog.  Patient also with acute metabolic encephalopathy. Not sure if his mental status changes may be due to MDD. Psych consulted.  Assessment & Plan   New onset diabetes with DKA -No history of diabetes -Patient presented with altered mental status -Last hemoglobin A1c was 5.0 in March 2019 -On admission blood glucose was 343, bicarb of 20, anion gap of 21, ketones noted on urinalysis, beta hydroxybutyrate acid > 8, VBG showed a pH of 7.36 -Hemoglobin A1c 13.1 -Patient was started on IV insulin per DKA protocol -Has now been transitioned to NovoLog and Lantus -On 12/9, tried to explain this to the patient using interpreter however patient had no questions  Acute metabolic encephalopathy -Possibly secondary to DKA -The  gap is closed and patient has been transitioned to NovoLog and Lantus, he continues to be altered -CT head unremarkable for acute intercranial normality -Ammonia level within normal limits -UDS unremarkable, blood alcohol level undetectable -UA showed trace leukocytes, 11-20 WBCs, no bacteria -Urine culture showed no growth  -CXR unremarkable  -will continue to monitor closely -Will obtain thiamine, B12, folate, TSH levels -Will start thiamine 100mg  daily after level is drawn -Will order MRI as well as EEG  Elevated LFTs -AST 46, ALT 97, total bilirubin 1.7 on admission -Hepatitis panel unremarkable -Abdominal ultrasound was normal -Continue to monitor CMP  Abnormal urinalysis -UA showed trace leukocytes, 11-20 WBCs, no bacteria -Currently patient afebrile with no leukocytosis -Urine culture showed no growth -Overnight 12/8, patient was noted to have purulent drainage from his urethra.  Have ordered G&C, HIV.  -HIV unremarkable  -G&C still pending  Acute Urinary retention -Continued to have urinary retention, patient had 3 in/outs -Foley catheter placed overnight -Continue Flomax  Distended bladder on CT -CT abdomen pelvis showed severely distended urinary bladder with mild bilateral collecting system dilatation. -Patient did void spontaneously in the ED -Continue to monitor and do bladder scans every 4 hours  MDD with psychotic features -Continue Lexapro, Risperdal, trazodone -Question if this is also adding to patient's acute metabolic encephalopathy as he appears to have ran out of his medications -Psychiatry consulted and appreciated -Today he was not responsive to me or the interpreter  Hypokalemia -Resolved with replacement however currently 3.6 -Continue monitor BMP  DVT Prophylaxis  Lovenox  Code Status:  Full  Family Communication: None at bedside.  Friend via phone  Disposition Plan:  Status is: Inpatient  Remains inpatient appropriate because:Altered  mental status   Dispo: The patient is from: Home              Anticipated d/c is to: Home              Anticipated d/c date is: 2 days              Patient currently is not medically stable to d/c.   Consultants None  Procedures  Abd [place]  Antibiotics   Anti-infectives (From admission, onward)   None      Subjective:   Garrett [name] seen and examined today.  Not interactive this morning.  Translator tried multiple times to ask him simple questions as what his name was where he was however patient stared off into space.    Objective:   Vitals:   03/11/20 2135 03/12/20 0100 03/12/20 0500 03/12/20 0800  BP: 113/87 (!) 114/93 93/71 124/90  Pulse: 99 98 96 90  Resp: 15 17 16 19   Temp: 98.3 F (36.8 C) 97.9 F (36.6 C) 98 F (36.7 C) 99 F (37.2 C)  TempSrc: Oral Oral Oral Oral  SpO2: 97% 98% 99% 97%  Weight:      Height:        Intake/Output Summary (Last 24 hours) at 03/12/2020 0958 Last data filed at 03/11/2020 2253 Gross per 24 hour  Intake 240 ml  Output 1250 ml  Net -1010 ml   Filed Weights   03/11/20 1050  Weight: 54.2 kg   Exam  General: Well developed, chronically ill-appearing, NAD  HEENT: NCAT, mucous membranes moist.  Poor dentition  Neck: Supple, no JVD, no masses  Cardiovascular: S1 S2 auscultated, RRR  Respiratory: Clear to auscultation bilaterally with equal chest rise  Abdomen: Soft, nontender, nondistended, + bowel sounds  Extremities: warm dry without cyanosis clubbing or edema  Neuro: Awake, not responding to questions or following commands  Psych: flat   Data Reviewed: I have personally reviewed following labs and imaging studies  CBC: Recent Labs  Lab 03/09/20 1411 03/09/20 1702 03/11/20 0107  WBC 7.6  --  6.2  HGB 14.7 14.3 12.3*  HCT 42.8 42.0 35.7*  MCV 94.5  --  95.2  PLT 270  --  164   Basic Metabolic Panel: Recent Labs  Lab 03/09/20 1411 03/09/20 1702 03/09/20 1945 03/09/20 2329 03/10/20 0307 03/11/20 0107   NA 139 143 141 144 144 140  K 4.3 4.4 3.7 3.6 3.3* 3.6  CL 98  --  104 105 106 101  CO2 20*  --  22 26 28 28   GLUCOSE 343*  --  216* 209* 177* 351*  BUN 12  --  13 10 10 11   CREATININE 0.83  --  0.85 0.61 0.56* 0.75  CALCIUM 10.0  --  9.8 9.9 9.7 9.7   GFR: Estimated Creatinine Clearance: 97.9 mL/min (by C-G formula based on SCr of 0.75 mg/dL). Liver Function Tests: Recent Labs  Lab 03/09/20 1411 03/11/20 0107  AST 46* 70*  ALT 97* 90*  ALKPHOS 86 64  BILITOT 1.7* 1.8*  PROT 9.1* 7.0  ALBUMIN 4.1 3.3*   No results for input(s): LIPASE, AMYLASE in the last 168 hours. Recent Labs  Lab 03/09/20 1644  AMMONIA 18   Coagulation Profile: No results for input(s): INR, PROTIME in the last 168 hours. Cardiac Enzymes: No results for input(s): CKTOTAL,  CKMB, CKMBINDEX, TROPONINI in the last 168 hours. BNP (last 3 results) No results for input(s): PROBNP in the last 8760 hours. HbA1C: Recent Labs    03/09/20 1411  HGBA1C 13.1*   CBG: Recent Labs  Lab 03/11/20 0737 03/11/20 1221 03/11/20 1636 03/11/20 2052 03/12/20 0730  GLUCAP 244* 238* 386* 280* 188*   Lipid Profile: No results for input(s): CHOL, HDL, LDLCALC, TRIG, CHOLHDL, LDLDIRECT in the last 72 hours. Thyroid Function Tests: No results for input(s): TSH, T4TOTAL, FREET4, T3FREE, THYROIDAB in the last 72 hours. Anemia Panel: No results for input(s): VITAMINB12, FOLATE, FERRITIN, TIBC, IRON, RETICCTPCT in the last 72 hours. Urine analysis:    Component Value Date/Time   COLORURINE YELLOW 03/09/2020 1945   APPEARANCEUR CLEAR 03/09/2020 1945   LABSPEC 1.036 (H) 03/09/2020 1945   PHURINE 5.0 03/09/2020 1945   GLUCOSEU >=500 (A) 03/09/2020 1945   HGBUR NEGATIVE 03/09/2020 1945   BILIRUBINUR NEGATIVE 03/09/2020 1945   KETONESUR 80 (A) 03/09/2020 1945   PROTEINUR 30 (A) 03/09/2020 1945   NITRITE NEGATIVE 03/09/2020 1945   LEUKOCYTESUR TRACE (A) 03/09/2020 1945   Sepsis  Labs: @LABRCNTIP (procalcitonin:4,lacticidven:4)  ) Recent Results (from the past 240 hour(s))  Resp Panel by RT-PCR (Flu A&B, Covid) Nasopharyngeal Swab     Status: None   Collection Time: 03/09/20  4:44 PM   Specimen: Nasopharyngeal Swab; Nasopharyngeal(NP) swabs in vial transport medium  Result Value Ref Range Status   SARS Coronavirus 2 by RT PCR NEGATIVE NEGATIVE Final    Comment: (NOTE) SARS-CoV-2 target nucleic acids are NOT DETECTED.  The SARS-CoV-2 RNA is generally detectable in upper respiratory specimens during the acute phase of infection. The lowest concentration of SARS-CoV-2 viral copies this assay can detect is 138 copies/mL. A negative result does not preclude SARS-Cov-2 infection and should not be used as the sole basis for treatment or other patient management decisions. A negative result may occur with  improper specimen collection/handling, submission of specimen other than nasopharyngeal swab, presence of viral mutation(s) within the areas targeted by this assay, and inadequate number of viral copies(<138 copies/mL). A negative result must be combined with clinical observations, patient history, and epidemiological information. The expected result is Negative.  Fact Sheet for Patients:  BloggerCourse.comhttps://www.fda.gov/media/152166/download  Fact Sheet for Healthcare Providers:  SeriousBroker.ithttps://www.fda.gov/media/152162/download  This test is no t yet approved or cleared by the [place] FDA and  has been authorized for detection and/or diagnosis of SARS-CoV-2 by FDA under an Emergency Use Authorization (EUA). This EUA will remain  in effect (meaning this test can be used) for the duration of the COVID-19 declaration under Section 564(b)(1) of the Act, 21 U.S.C.section 360bbb-3(b)(1), unless the authorization is terminated  or revoked sooner.       Influenza A by PCR NEGATIVE NEGATIVE Final   Influenza B by PCR NEGATIVE NEGATIVE Final    Comment: (NOTE) The Xpert  Xpress SARS-CoV-2/FLU/RSV plus assay is intended as an aid in the diagnosis of influenza from Nasopharyngeal swab specimens and should not be used as a sole basis for treatment. Nasal washings and aspirates are unacceptable for Xpert Xpress SARS-CoV-2/FLU/RSV testing.  Fact Sheet for Patients: BloggerCourse.comhttps://www.fda.gov/media/152166/download  Fact Sheet for Healthcare Providers: SeriousBroker.ithttps://www.fda.gov/media/152162/download  This test is not yet approved or cleared by the [place] FDA and has been authorized for detection and/or diagnosis of SARS-CoV-2 by FDA under an Emergency Use Authorization (EUA). This EUA will remain in effect (meaning this test can be used) for the duration of the COVID-19 declaration under Section  564(b)(1) of the Act, 21 U.S.C. section 360bbb-3(b)(1), unless the authorization is terminated or revoked.  Performed at [facility], [address] [place], [place] [address]   Culture, blood (routine x 2)     Status: None (Preliminary result)   Collection Time: 03/09/20  4:44 PM   Specimen: BLOOD  Result Value Ref Range Status   Specimen Description BLOOD SITE NOT SPECIFIED  Final   Special Requests   Final    BOTTLES DRAWN AEROBIC AND ANAEROBIC Blood Culture results may not be optimal due to an inadequate volume of blood received in culture bottles   Culture   Final    NO GROWTH 3 DAYS Performed at [facility], [address], [place], [place] [address]    Report Status PENDING  Incomplete  Culture, blood (routine x 2)     Status: None (Preliminary result)   Collection Time: 03/09/20  4:55 PM   Specimen: BLOOD  Result Value Ref Range Status   Specimen Description BLOOD SITE NOT SPECIFIED  Final   Special Requests   Final    BOTTLES DRAWN AEROBIC AND ANAEROBIC Blood Culture results may not be optimal due to an inadequate volume of blood received in culture bottles   Culture   Final    NO GROWTH 3 DAYS Performed at [facility], [address], [place], [place] [address]    Report Status PENDING  Incomplete  Culture, Urine     Status: None   Collection Time: 03/11/20  3:20 AM   Specimen: Urine, Random  Result Value Ref Range Status   Specimen Description URINE, RANDOM  Final   Special Requests NONE  Final   Culture   Final    NO GROWTH Performed at [facility], [address] [place], [place] [address]    Report Status 03/12/2020 FINAL  Final      Radiology Studies: No results found.   Scheduled Meds: . Chlorhexidine Gluconate Cloth  6 each Topical Daily  . enoxaparin (LOVENOX) injection  40 mg Subcutaneous Q24H  . escitalopram  10 mg Oral Daily  . feeding supplement  237 mL Oral BID BM  . insulin aspart  0-15 Units Subcutaneous TID WC  . insulin aspart  2 Units Subcutaneous TID WC  . insulin glargine  10 Units Subcutaneous BID  . risperiDONE  2 mg Oral BID  . tamsulosin  0.4 mg Oral Daily   Continuous Infusions: . sodium chloride 75 mL/hr at 03/11/20 1702     LOS: 3 days   Time Spent in minutes   45 minutes  Garrett [name] D.O. on 03/12/2020 at 9:58 AM  Between 7am to 7pm - Please see pager noted on [email or web address]  After 7pm go to [email or web address]  And look for the night coverage person covering for me after hours  [facility] Office  [phone]

## 2020-03-12 NOTE — Progress Notes (Signed)
EEG complete - results pending 

## 2020-03-12 NOTE — Procedures (Signed)
Patient Name: Garrett [name]  MRN: [id number]  Epilepsy Attending: Charlsie Quest  Referring Physician/Provider: Dr Edsel Petrin Date: 03/12/2020 Duration: 28.15 mins  Patient history: 34 year old male with altered mental status.  EEG to evaluate for seizures.  Level of alertness: Awake, asleep  AEDs during EEG study: None  Technical aspects: This EEG study was done with scalp electrodes positioned according to the 10-20 International system of electrode placement. Electrical activity was acquired at a sampling rate of 500Hz  and reviewed with a high frequency filter of 70Hz  and a low frequency filter of 1Hz . EEG data were recorded continuously and digitally stored.   Description: The posterior dominant rhythm consists of 9-10 Hz activity of moderate voltage (25-35 uV) seen predominantly in posterior head regions, symmetric and reactive to eye opening and eye closing. Sleep was characterized by vertex waves, sleep spindles (12 to 14 Hz), maximal frontocentral region.   Physiologic photic driving was not seen during photic stimulation.  Hyperventilation was not performed.     IMPRESSION: This study is within normal limits. No seizures or epileptiform discharges were seen throughout the recording.  Garrett [name] 

## 2020-03-13 LAB — GLUCOSE, CAPILLARY
Glucose-Capillary: 165 mg/dL — ABNORMAL HIGH (ref 70–99)
Glucose-Capillary: 326 mg/dL — ABNORMAL HIGH (ref 70–99)
Glucose-Capillary: 167 mg/dL — ABNORMAL HIGH (ref 70–99)
Glucose-Capillary: 120 mg/dL — ABNORMAL HIGH (ref 70–99)

## 2020-03-13 LAB — BASIC METABOLIC PANEL
Anion gap: 9 (ref 5–15)
BUN: 9 mg/dL (ref 6–20)
CO2: 26 mmol/L (ref 22–32)
Calcium: 8.8 mg/dL — ABNORMAL LOW (ref 8.9–10.3)
Chloride: 106 mmol/L (ref 98–111)
Creatinine, Ser: 0.47 mg/dL — ABNORMAL LOW (ref 0.61–1.24)
GFR, Estimated: >60 mL/min (ref >60)
Glucose, Bld: 172 mg/dL — ABNORMAL HIGH (ref 70–99)
Potassium: 3.4 mmol/L — ABNORMAL LOW (ref 3.5–5.1)
Sodium: 141 mmol/L (ref 135–145)

## 2020-03-13 MED ORDER — POTASSIUM CHLORIDE 10 MEQ/100ML IV SOLN
10.0000 meq | INTRAVENOUS | Status: AC
  Administered 2020-03-13 (×4): 10 meq via INTRAVENOUS
  Filled 2020-03-13 (×4): qty 100

## 2020-03-13 NOTE — Progress Notes (Signed)
PROGRESS NOTE    Garrett [name]  Garrett [name] DOB: 08-Dec-1985 DOA: 03/09/2020 PCP: Patient, No Pcp Per   Brief Narrative:  HPI on 03/09/2020 by Dr. John Giovanni Herron Garrett [name] is a 34 y.o. 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  Per triage note, family reported that patient woke up altered this morning and has not been communicating as per usual.  Patient was not able to answer orientation questions appropriately at triage.  Interpreter services used to obtain history as patient speaks [name].  Patient appears confused and oriented to person and place only.  He is not sure why he is here and has no complaints.  Reports history of diabetes but does not know when he was diagnosed and states he is not on any medications.  Denies fevers, cough, shortness of breath, chest pain, nausea, vomiting, abdominal pain, or diarrhea.  No additional history could be obtained from him.  Interim history Patient admitted with new onset diabetes found to be in DKA.  Has been transitioned off of insulin drip and onto Lantus and NovoLog.  Patient also with acute metabolic encephalopathy. Not sure if his mental status changes may be due to MDD. Psych consulted and patient will need inpatient psych admission when medically stable.   Assessment & Plan   New onset diabetes with DKA -No history of diabetes -Patient presented with altered mental status -Last hemoglobin A1c was 5.0 in March 2019 -On admission blood glucose was 343, bicarb of 20, anion gap of 21, ketones noted on urinalysis, beta hydroxybutyrate acid > 8, VBG showed a pH of 7.36 -Hemoglobin A1c 13.1 -Patient was started on IV insulin per DKA protocol -Has now been transitioned to NovoLog and Lantus -On 12/9, tried to explain this to the patient using interpreter however patient had no  questions  Acute metabolic encephalopathy -Possibly secondary to DKA vs psychosis -The gap is closed and patient has been transitioned to NovoLog and Lantus, he continues to be altered -CT head unremarkable for acute intercranial normality -Ammonia level within normal limits -UDS unremarkable, blood alcohol level undetectable -UA showed trace leukocytes, 11-20 WBCs, no bacteria -Urine culture showed no growth  -CXR unremarkable  -MRI brain: Rare foci of T2 hyperintensity within the white matter of cerebral hemispheres.  Mild para nasal sinus disease. -Discussed MRI findings with neurology, Dr. Amada Jupiter, MRI is unremarkable for a CVA.  Findings are nonspecific, no acute intervention needed. -EEG: Study within normal limits.  No seizure or left form discharges seen throughout recording. -Vitamin B12, folate, TSH levels within normal limits -Vitamin B1 level pending -Continue thiamine supplementation -Continue to monitor closely  Elevated LFTs -AST 46, ALT 97, total bilirubin 1.7 on admission -Hepatitis panel unremarkable -Abdominal ultrasound was normal -Continue to monitor CMP  Abnormal urinalysis -UA showed trace leukocytes, 11-20 WBCs, no bacteria -Currently patient afebrile with no leukocytosis -Urine culture showed no growth -Overnight 12/8, patient was noted to have purulent drainage from his urethra.  Have ordered G&C, HIV.  -HIV unremarkable  -G&C pending  Acute Urinary retention -Continued to have urinary retention, patient had 3 in/outs -Foley catheter placed overnight -Continue Flomax  Distended bladder on CT -CT abdomen pelvis showed severely distended urinary bladder with mild bilateral collecting system dilatation. -Patient did void spontaneously in the ED -Continue to monitor and do bladder scans every 4 hours  MDD with psychotic  features -Continue Lexapro, Risperdal, trazodone -Question if this is also adding to patient's acute metabolic encephalopathy as  he appears to have ran out of his medications -Patient remains alert, however, unresponsive at this time and just stares  -Psychiatry consulted and appreciated-recommended continuing home medications.  Patient may benefit from different anti-psychotic med given that risperidone can cause hyperglycemia/diabetes. Ativan 0.5mg  PO BID started. Inpatient psychiatric admission recommended once patient is medically cleared. Reconsult once patient is able to participate in evaluation.   Hypokalemia -potassium 3.4 -will supplement IV -Continue monitor BMP  Possible dysphagia -RN reports patient with coughing after swallowing -Speech therapy consulted for swallow eval   DVT Prophylaxis  Lovenox  Code Status: Full  Family Communication: None at bedside.    Disposition Plan:  Status is: Inpatient  Remains inpatient appropriate because:Altered mental status   Dispo: The patient is from: Home              Anticipated d/c is to: Home              Anticipated d/c date is: 2 days              Patient currently is not medically stable to d/c.   Consultants None  Procedures  Abd [place]  Antibiotics   Anti-infectives (From admission, onward)   None      Subjective:   Garrett [name] seen and examined today.  Not interactive this morning.    Objective:   Vitals:   03/13/20 0453 03/13/20 0800 03/13/20 0801 03/13/20 1201  BP: 117/85 113/83 113/83 109/72  Pulse: 88  88 100  Resp: 16 14 18 18   Temp: 98.2 F (36.8 C)  98.1 F (36.7 C) 97.9 F (36.6 C)  TempSrc: Axillary  Axillary Oral  SpO2: 95% 95% 93% 97%  Weight:      Height:        Intake/Output Summary (Last 24 hours) at 03/13/2020 1218 Last data filed at 03/13/2020 14/02/2020 Gross per 24 hour  Intake 2006.93 ml  Output 1400 ml  Net 606.93 ml   Filed Weights   03/11/20 1050  Weight: 54.2 kg   Exam  General: Well developed, Chronically ill-appearing, NAD  HEENT: NCAT, mucous membranes moist.  Poor  dentition  Cardiovascular: S1 S2 auscultated, RRR  Respiratory: Clear to auscultation bilaterally with equal chest rise  Abdomen: Soft, nontender, nondistended, + bowel sounds  Extremities: warm dry without cyanosis clubbing or edema  Neuro: Awake and alert however cannot assess orientation.  Data Reviewed: I have personally reviewed following labs and imaging studies  CBC: Recent Labs  Lab 03/09/20 1411 03/09/20 1702 03/11/20 0107  WBC 7.6  --  6.2  HGB 14.7 14.3 12.3*  HCT 42.8 42.0 35.7*  MCV 94.5  --  95.2  PLT 270  --  164   Basic Metabolic Panel: Recent Labs  Lab 03/09/20 2329 03/10/20 0307 03/11/20 0107 03/12/20 1032 03/12/20 1033 03/13/20 0746  NA 144 144 140  --  143 141  K 3.6 3.3* 3.6  --  3.3* 3.4*  CL 105 106 101  --  106 106  CO2 26 28 28   --  28 26  GLUCOSE 209* 177* 351*  --  163* 172*  BUN 10 10 11   --  10 9  CREATININE 0.61 0.56* 0.75  --  0.55* 0.47*  CALCIUM 9.9 9.7 9.7  --  9.0 8.8*  MG  --   --   --  1.8  --   --  GFR: Estimated Creatinine Clearance: 97.9 mL/min (A) (by C-G formula based on SCr of 0.47 mg/dL (L)). Liver Function Tests: Recent Labs  Lab 03/09/20 1411 03/11/20 0107 03/12/20 1033  AST 46* 70* 51*  ALT 97* 90* 91*  ALKPHOS 86 64 61  BILITOT 1.7* 1.8* 1.4*  PROT 9.1* 7.0 6.7  ALBUMIN 4.1 3.3* 3.1*   No results for input(s): LIPASE, AMYLASE in the last 168 hours. Recent Labs  Lab 03/09/20 1644  AMMONIA 18   Coagulation Profile: No results for input(s): INR, PROTIME in the last 168 hours. Cardiac Enzymes: No results for input(s): CKTOTAL, CKMB, CKMBINDEX, TROPONINI in the last 168 hours. BNP (last 3 results) No results for input(s): PROBNP in the last 8760 hours. HbA1C: No results for input(s): HGBA1C in the last 72 hours. CBG: Recent Labs  Lab 03/12/20 1625 03/12/20 2053 03/12/20 2334 03/13/20 0759 03/13/20 1203  GLUCAP 97 252* 172* 165* 326*   Lipid Profile: No results for input(s): CHOL, HDL,  LDLCALC, TRIG, CHOLHDL, LDLDIRECT in the last 72 hours. Thyroid Function Tests: Recent Labs    03/12/20 1033  TSH 2.181   Anemia Panel: Recent Labs    03/12/20 1033  VITAMINB12 1,187*  FOLATE 21.1   Urine analysis:    Component Value Date/Time   COLORURINE YELLOW 03/09/2020 1945   APPEARANCEUR CLEAR 03/09/2020 1945   LABSPEC 1.036 (H) 03/09/2020 1945   PHURINE 5.0 03/09/2020 1945   GLUCOSEU >=500 (A) 03/09/2020 1945   HGBUR NEGATIVE 03/09/2020 1945   BILIRUBINUR NEGATIVE 03/09/2020 1945   KETONESUR 80 (A) 03/09/2020 1945   PROTEINUR 30 (A) 03/09/2020 1945   NITRITE NEGATIVE 03/09/2020 1945   LEUKOCYTESUR TRACE (A) 03/09/2020 1945   Sepsis Labs: @LABRCNTIP (procalcitonin:4,lacticidven:4)  ) Recent Results (from the past 240 hour(s))  Resp Panel by RT-PCR (Flu A&B, Covid) Nasopharyngeal Swab     Status: None   Collection Time: 03/09/20  4:44 PM   Specimen: Nasopharyngeal Swab; Nasopharyngeal(NP) swabs in vial transport medium  Result Value Ref Range Status   SARS Coronavirus 2 by RT PCR NEGATIVE NEGATIVE Final    Comment: (NOTE) SARS-CoV-2 target nucleic acids are NOT DETECTED.  The SARS-CoV-2 RNA is generally detectable in upper respiratory specimens during the acute phase of infection. The lowest concentration of SARS-CoV-2 viral copies this assay can detect is 138 copies/mL. A negative result does not preclude SARS-Cov-2 infection and should not be used as the sole basis for treatment or other patient management decisions. A negative result may occur with  improper specimen collection/handling, submission of specimen other than nasopharyngeal swab, presence of viral mutation(s) within the areas targeted by this assay, and inadequate number of viral copies(<138 copies/mL). A negative result must be combined with clinical observations, patient history, and epidemiological information. The expected result is Negative.  Fact Sheet for Patients:   14/07/21  Fact Sheet for Healthcare Providers:  BloggerCourse.com  This test is no t yet approved or cleared by the [place] FDA and  has been authorized for detection and/or diagnosis of SARS-CoV-2 by FDA under an Emergency Use Authorization (EUA). This EUA will remain  in effect (meaning this test can be used) for the duration of the COVID-19 declaration under Section 564(b)(1) of the Act, 21 U.S.C.section 360bbb-3(b)(1), unless the authorization is terminated  or revoked sooner.       Influenza A by PCR NEGATIVE NEGATIVE Final   Influenza B by PCR NEGATIVE NEGATIVE Final    Comment: (NOTE) The Xpert Xpress SARS-CoV-2/FLU/RSV plus assay is intended  as an aid in the diagnosis of influenza from Nasopharyngeal swab specimens and should not be used as a sole basis for treatment. Nasal washings and aspirates are unacceptable for Xpert Xpress SARS-CoV-2/FLU/RSV testing.  Fact Sheet for Patients: BloggerCourse.com  Fact Sheet for Healthcare Providers: SeriousBroker.it  This test is not yet approved or cleared by the [place] FDA and has been authorized for detection and/or diagnosis of SARS-CoV-2 by FDA under an Emergency Use Authorization (EUA). This EUA will remain in effect (meaning this test can be used) for the duration of the COVID-19 declaration under Section 564(b)(1) of the Act, 21 U.S.C. section 360bbb-3(b)(1), unless the authorization is terminated or revoked.  Performed at [facility], [address], [place], [place] [address]   Culture, blood (routine x 2)     Status: None (Preliminary result)   Collection Time: 03/09/20  4:44 PM   Specimen: BLOOD  Result Value Ref Range Status   Specimen Description BLOOD SITE NOT SPECIFIED  Final   Special Requests   Final    BOTTLES DRAWN AEROBIC AND ANAEROBIC Blood Culture results may not be optimal  due to an inadequate volume of blood received in culture bottles   Culture   Final    NO GROWTH 4 DAYS Performed at [facility], [address], [place], [place] [address]    Report Status PENDING  Incomplete  Culture, blood (routine x 2)     Status: None (Preliminary result)   Collection Time: 03/09/20  4:55 PM   Specimen: BLOOD  Result Value Ref Range Status   Specimen Description BLOOD SITE NOT SPECIFIED  Final   Special Requests   Final    BOTTLES DRAWN AEROBIC AND ANAEROBIC Blood Culture results may not be optimal due to an inadequate volume of blood received in culture bottles   Culture   Final    NO GROWTH 4 DAYS Performed at [facility], [address], [place], [place] [address]    Report Status PENDING  Incomplete  Culture, Urine     Status: None   Collection Time: 03/11/20  3:20 AM   Specimen: Urine, Random  Result Value Ref Range Status   Specimen Description URINE, RANDOM  Final   Special Requests NONE  Final   Culture   Final    NO GROWTH Performed at [facility], [address], [place], [place] [address]    Report Status 03/12/2020 FINAL  Final      Radiology Studies: MR BRAIN WO CONTRAST  Result Date: 03/12/2020 CLINICAL DATA:  Mental status changes of unknown cause EXAM: MRI HEAD WITHOUT CONTRAST TECHNIQUE: Multiplanar, multiecho pulse sequences of the brain and surrounding structures were obtained without intravenous contrast. COMPARISON:  Head CT March 09, 2020. FINDINGS: Brain: No acute infarction, hemorrhage, hydrocephalus, extra-axial collection or mass lesion. Rare foci of T2 hyperintensity are seen within the white matter of the cerebral hemispheres, nonspecific. Vascular: Normal flow voids. Skull and upper cervical spine: Normal marrow signal. Sinuses/Orbits: Mild mucosal thickening throughout the paranasal sinuses. The orbits are maintained. Other: None. IMPRESSION: 1. Rare foci of T2 hyperintensity within the white matter of the  cerebral hemispheres are nonspecific but may be seen in the setting of chronic small vessel ischemic disease, migraine headaches and post inflammatory/infectious process. 2. Mild paranasal sinus disease. Electronically Signed   By: Baldemar Lenis M.D.   On: 03/12/2020 14:38   EEG adult  Result Date: 03/12/2020 Charlsie Quest,  MD     03/12/2020 12:57 PM Patient Name: Shirl HarrisHtee Holsinger MRN: [id number] Epilepsy Attending: Charlsie QuestPriyanka O Yadav Referring Physician/Provider: Dr Edsel PetrinMaryann Emryn Flanery Date: 03/12/2020 Duration: 28.15 mins Patient history: 34 year old male with altered mental status.  EEG to evaluate for seizures. Level of alertness: Awake, asleep AEDs during EEG study: None Technical aspects: This EEG study was done with scalp electrodes positioned according to the 10-20 International system of electrode placement. Electrical activity was acquired at a sampling rate of 500Hz  and reviewed with a high frequency filter of 70Hz  and a low frequency filter of 1Hz . EEG data were recorded continuously and digitally stored. Description: The posterior dominant rhythm consists of 9-10 Hz activity of moderate voltage (25-35 uV) seen predominantly in posterior head regions, symmetric and reactive to eye opening and eye closing. Sleep was characterized by vertex waves, sleep spindles (12 to 14 Hz), maximal frontocentral region.   Physiologic photic driving was not seen during photic stimulation.  Hyperventilation was not performed.   IMPRESSION: This study is within normal limits. No seizures or epileptiform discharges were seen throughout the recording. Priyanka Annabelle Harman Yadav     Scheduled Meds: . Chlorhexidine Gluconate Cloth  6 each Topical Daily  . enoxaparin (LOVENOX) injection  40 mg Subcutaneous Q24H  . escitalopram  10 mg Oral Daily  . feeding supplement  237 mL Oral BID BM  . insulin aspart  0-15 Units Subcutaneous TID WC  . insulin aspart  2 Units Subcutaneous TID WC  . insulin glargine  10 Units  Subcutaneous BID  . LORazepam  0.5 mg Oral BID  . risperiDONE  2 mg Oral BID  . tamsulosin  0.4 mg Oral Daily  . thiamine  100 mg Oral Daily   Continuous Infusions: . sodium chloride 75 mL/hr at 03/13/20 1214  . potassium chloride 10 mEq (03/13/20 1216)     LOS: 4 days   Time Spent in minutes   45 minutes  Lulabelle Desta D.O. on 03/13/2020 at 12:18 PM  Between 7am to 7pm - Please see pager noted on [email or web address]  After 7pm go to [email or web address]  And look for the night coverage person covering for me after hours  [facility] Office  [phone]

## 2020-03-14 LAB — CBC
HCT: 27.1 % — ABNORMAL LOW (ref 39.0–52.0)
Hemoglobin: 9.4 g/dL — ABNORMAL LOW (ref 13.0–17.0)
MCH: 33.5 pg (ref 26.0–34.0)
MCHC: 34.7 g/dL (ref 30.0–36.0)
MCV: 96.4 fL (ref 80.0–100.0)
Platelets: 124 10*3/uL — ABNORMAL LOW (ref 150–400)
RBC: 2.81 MIL/uL — ABNORMAL LOW (ref 4.22–5.81)
RDW: 11.5 % (ref 11.5–15.5)
WBC: 6.9 10*3/uL (ref 4.0–10.5)
nRBC: 0 % (ref 0.0–0.2)

## 2020-03-14 LAB — IRON AND TIBC
Iron: 163 ug/dL (ref 45–182)
Saturation Ratios: 64 % — ABNORMAL HIGH (ref 17.9–39.5)
TIBC: 256 ug/dL (ref 250–450)
UIBC: 93 ug/dL

## 2020-03-14 LAB — COMPREHENSIVE METABOLIC PANEL
ALT: 65 U/L — ABNORMAL HIGH (ref 0–44)
AST: 43 U/L — ABNORMAL HIGH (ref 15–41)
Albumin: 2.6 g/dL — ABNORMAL LOW (ref 3.5–5.0)
Alkaline Phosphatase: 48 U/L (ref 38–126)
Anion gap: 9 (ref 5–15)
BUN: 9 mg/dL (ref 6–20)
CO2: 25 mmol/L (ref 22–32)
Calcium: 8.5 mg/dL — ABNORMAL LOW (ref 8.9–10.3)
Chloride: 105 mmol/L (ref 98–111)
Creatinine, Ser: 0.56 mg/dL — ABNORMAL LOW (ref 0.61–1.24)
GFR, Estimated: >60 mL/min (ref >60)
Glucose, Bld: 139 mg/dL — ABNORMAL HIGH (ref 70–99)
Potassium: 3.5 mmol/L (ref 3.5–5.1)
Sodium: 139 mmol/L (ref 135–145)
Total Bilirubin: 1.3 mg/dL — ABNORMAL HIGH (ref 0.3–1.2)
Total Protein: 5.6 g/dL — ABNORMAL LOW (ref 6.5–8.1)

## 2020-03-14 LAB — GLUCOSE, CAPILLARY
Glucose-Capillary: 136 mg/dL — ABNORMAL HIGH (ref 70–99)
Glucose-Capillary: 277 mg/dL — ABNORMAL HIGH (ref 70–99)
Glucose-Capillary: 348 mg/dL — ABNORMAL HIGH (ref 70–99)
Glucose-Capillary: 262 mg/dL — ABNORMAL HIGH (ref 70–99)

## 2020-03-14 LAB — FERRITIN: Ferritin: 445 ng/mL — ABNORMAL HIGH (ref 24–336)

## 2020-03-14 LAB — CULTURE, BLOOD (ROUTINE X 2)
Culture: NO GROWTH
Culture: NO GROWTH

## 2020-03-14 LAB — FOLATE: Folate: 16.5 ng/mL (ref >5.9)

## 2020-03-14 LAB — RETICULOCYTES
Immature Retic Fract: 10.3 % (ref 2.3–15.9)
RBC.: 2.81 MIL/uL — ABNORMAL LOW (ref 4.22–5.81)
Retic Count, Absolute: 97.2 10*3/uL (ref 19.0–186.0)
Retic Ct Pct: 3.5 % — ABNORMAL HIGH (ref 0.4–3.1)

## 2020-03-14 LAB — VITAMIN B12: Vitamin B-12: 750 pg/mL (ref 180–914)

## 2020-03-14 MED ORDER — POTASSIUM CHLORIDE CRYS ER 20 MEQ PO TBCR
40.0000 meq | EXTENDED_RELEASE_TABLET | Freq: Once | ORAL | Status: AC
  Administered 2020-03-14: 11:00:00 40 meq via ORAL
  Filled 2020-03-14: qty 2

## 2020-03-14 MED ORDER — IBUPROFEN 400 MG PO TABS: 200.0000 mg | ORAL_TABLET | Freq: Four times a day (QID) | ORAL | Status: DC | PRN

## 2020-03-14 MED ORDER — LIDOCAINE 5 % EX PTCH
1.0000 | MEDICATED_PATCH | Freq: Every day | CUTANEOUS | Status: DC
  Administered 2020-03-14 – 2020-03-16 (×3): 1 via TRANSDERMAL
  Filled 2020-03-14 (×3): qty 1

## 2020-03-14 MED ORDER — IBUPROFEN 400 MG PO TABS
400.0000 mg | ORAL_TABLET | Freq: Three times a day (TID) | ORAL | Status: DC | PRN
  Administered 2020-03-14: 400 mg via ORAL
  Filled 2020-03-14: qty 1

## 2020-03-14 NOTE — Progress Notes (Signed)
PROGRESS NOTE    Garrett [name]  ZOX:[id number] DOB: Oct 25, 1985 DOA: 03/09/2020 PCP: Patient, No Pcp Per   Brief Narrative:  HPI on 03/09/2020 by Dr. John Giovanni Jewel [name] is a 34 y.o. 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  Per triage note, family reported that patient woke up altered this morning and has not been communicating as per usual.  Patient was not able to answer orientation questions appropriately at triage.  Interpreter services used to obtain history as patient speaks Garrett [name].  Patient appears confused and oriented to person and place only.  He is not sure why he is here and has no complaints.  Reports history of diabetes but does not know when he was diagnosed and states he is not on any medications.  Denies fevers, cough, shortness of breath, chest pain, nausea, vomiting, abdominal pain, or diarrhea.  No additional history could be obtained from him.  Interim history Patient admitted with new onset diabetes found to be in DKA.  Has been transitioned off of insulin drip and onto Lantus and NovoLog.  Patient also with acute metabolic encephalopathy. Not sure if his mental status changes may be due to MDD. Psych consulted and patient will need inpatient psych admission when medically stable.   Assessment & Plan   New onset diabetes with DKA -No history of diabetes -Patient presented with altered mental status -Last hemoglobin A1c was 5.0 in March 2019 -On admission blood glucose was 343, bicarb of 20, anion gap of 21, ketones noted on urinalysis, beta hydroxybutyrate acid > 8, VBG showed a pH of 7.36 -Hemoglobin A1c 13.1 -Patient was started on IV insulin per DKA protocol -Has now been transitioned to NovoLog and Lantus -On 12/9, tried to explain this to the patient using interpreter however patient had no  questions  Acute metabolic encephalopathy -Possibly secondary to DKA vs psychosis -The gap is closed and patient has been transitioned to NovoLog and Lantus, he continues to be altered -CT head unremarkable for acute intercranial normality -Ammonia level within normal limits -UDS unremarkable, blood alcohol level undetectable -UA showed trace leukocytes, 11-20 WBCs, no bacteria -Urine culture showed no growth  -CXR unremarkable  -MRI brain: Rare foci of T2 hyperintensity within the white matter of cerebral hemispheres.  Mild para nasal sinus disease. -Discussed MRI findings with neurology, Dr. Amada Jupiter, MRI is unremarkable for a CVA.  Findings are nonspecific, no acute intervention needed. -EEG: Study within normal limits.  No seizure or left form discharges seen throughout recording. -Vitamin B12, folate, TSH levels within normal limits -Vitamin B1 level pending -Continue thiamine supplementation -Continue to monitor closely -Improved, patient currently alert and oriented x4.  Appears to be at baseline. -Discussed with patient's cousin on 03/13/2020, it appears the patient has had similar episodes of being nonverbal and altered in the past approximately 3-4 times and has even had to be admitted to inpatient psych.  Elevated LFTs -AST 46, ALT 97, total bilirubin 1.7 on admission- trending downward -Hepatitis panel unremarkable -Abdominal ultrasound was normal -Continue to monitor CMP  Abnormal urinalysis -UA showed trace leukocytes, 11-20 WBCs, no bacteria -Currently patient afebrile with no leukocytosis -Urine culture showed no growth -Overnight 12/8, patient was noted to have purulent drainage from his urethra.  Have ordered G&C, HIV.  -HIV unremarkable  -G&C pending  Acute Urinary retention -Continued to have urinary retention,  patient had 3 in/outs -Foley catheter placed overnight- will attempt voiding trial -Continue Flomax  Distended bladder on CT -CT abdomen pelvis  showed severely distended urinary bladder with mild bilateral collecting system dilatation. -Patient did void spontaneously in the ED -Continue to monitor and do bladder scans every 4 hours  MDD with psychotic features -Continue Lexapro, Risperdal, trazodone -Question if this is also adding to patient's acute metabolic encephalopathy as he appears to have ran out of his medications -Patient remains alert, however, unresponsive at this time and just stares  -Psychiatry consulted and appreciated-recommended continuing home medications.  Patient may benefit from different anti-psychotic med given that risperidone can cause hyperglycemia/diabetes. Ativan 0.5mg  PO BID started. Inpatient psychiatric admission recommended once patient is medically cleared. Reconsult once patient is able to participate in evaluation.  -As above, discussed with patient's cousin.  This has occurred in the past and patient has required inpatient psych admission  Hypokalemia -resolved with replacement -will give  -will supplement IV -Continue monitor BMP  Possible dysphagia -RN reports patient with coughing after swallowing -Speech therapy consulted for swallow eval  Normocytic anemia -Hemoglobin on admission was 14 however down to 9.4 today -Possibly dilutional given IV fluids -Anemia panel obtained, patient does have adequate iron as well as storage -FOBT pending -Continue to monitor CBC   DVT Prophylaxis  Lovenox  Code Status: Full  Family Communication: None at bedside.    Disposition Plan:  Status is: Inpatient  Remains inpatient appropriate because:Altered mental status-will need inpatient psychiatric admission   Dispo: The patient is from: Home              Anticipated d/c is to: Inpatient psych              Anticipated d/c date is: 2 days              Patient currently is not medically stable to d/c.   Consultants None  Procedures  Abd US  Antibiotics   Anti-infectives (From  admission, onward)   None      Subjective:   Garrett [name] seen and examined today.  Patient has no complaints of chest pain, shortness breath, abdominal pain, nausea or vomiting, diarrhea or constipation.    Objective:   Vitals:   03/13/20 2000 03/14/20 0000 03/14/20 0400 03/14/20 0759  BP: 99/67 109/76 97/64 100/63  Pulse: 97 86 98   Resp: 16 13 16 18   Temp: 98.4 F (36.9 C) 97.7 F (36.5 C) 98 F (36.7 C) 98.8 F (37.1 C)  TempSrc: Axillary Axillary Axillary Oral  SpO2: 99% 100% 100%   Weight:      Height:        Intake/Output Summary (Last 24 hours) at 03/14/2020 0946 Last data filed at 03/14/2020 [id number] Gross per 24 hour  Intake 1740 ml  Output 800 ml  Net 940 ml   Filed Weights   03/11/20 1050  Weight: 54.2 kg   Exam  General: Well developed, chronically ill-appearing, NAD  HEENT: NCAT, mucous membranes moist.  Poor dentition  Cardiovascular: S1 S2 auscultated, RRR  Respiratory: Clear to auscultation bilaterally  Abdomen: Soft, nontender, nondistended, + bowel sounds  Extremities: warm dry without cyanosis clubbing or edema  Neuro: AAOx3, nonfocal, able to respond to questions appropriately and follow commands  Psych: appropriate mood and affect  Data Reviewed: I have personally reviewed following labs and imaging studies  CBC: Recent Labs  Lab 03/09/20 1411 03/09/20 1702 03/11/20 0107 03/14/20 0422  WBC 7.6  --  6.2 6.9  HGB 14.7 14.3 12.3* 9.4*  HCT 42.8 42.0 35.7* 27.1*  MCV 94.5  --  95.2 96.4  PLT 270  --  164 124*   Basic Metabolic Panel: Recent Labs  Lab 03/10/20 0307 03/11/20 0107 03/12/20 1032 03/12/20 1033 03/13/20 0746 03/14/20 0422  NA 144 140  --  143 141 139  K 3.3* 3.6  --  3.3* 3.4* 3.5  CL 106 101  --  106 106 105  CO2 28 28  --  GLUCOSE 177* 351*  --  163* 172* 139*  BUN 10 11  --  CREATININE 0.56* 0.75  --  0.55* 0.47* 0.56*  CALCIUM 9.7 9.7  --  9.0 8.8* 8.5*  MG  --   --  1.8  --   --   --     GFR: Estimated Creatinine Clearance: 97.9 mL/min (A) (by C-G formula based on SCr of 0.56 mg/dL (L)). Liver Function Tests: Recent Labs  Lab 03/09/20 1411 03/11/20 0107 03/12/20 1033 03/14/20 0422  AST 46* 70* 51* 43*  ALT 97* 90* 91* 65*  ALKPHOS 86 64 61 48  BILITOT 1.7* 1.8* 1.4* 1.3*  PROT 9.1* 7.0 6.7 5.6*  ALBUMIN 4.1 3.3* 3.1* 2.6*   No results for input(s): LIPASE, AMYLASE in the last 168 hours. Recent Labs  Lab 03/09/20 1644  AMMONIA 18   Coagulation Profile: No results for input(s): INR, PROTIME in the last 168 hours. Cardiac Enzymes: No results for input(s): CKTOTAL, CKMB, CKMBINDEX, TROPONINI in the last 168 hours. BNP (last 3 results) No results for input(s): PROBNP in the last 8760 hours. HbA1C: No results for input(s): HGBA1C in the last 72 hours. CBG: Recent Labs  Lab 03/13/20 0759 03/13/20 1203 03/13/20 1602 03/13/20 2052 03/14/20 0721  GLUCAP 165* 326* 167* 120* 136*   Lipid Profile: No results for input(s): CHOL, HDL, LDLCALC, TRIG, CHOLHDL, LDLDIRECT in the last 72 hours. Thyroid Function Tests: Recent Labs    03/12/20 1033  TSH 2.181   Anemia Panel: Recent Labs    03/12/20 1033 03/14/20 0718  VITAMINB12 1,187* 750  FOLATE 21.1 16.5  FERRITIN  --  445*  TIBC  --  256  IRON  --  163  RETICCTPCT  --  3.5*   Urine analysis:    Component Value Date/Time   COLORURINE YELLOW 03/09/2020 1945   APPEARANCEUR CLEAR 03/09/2020 1945   LABSPEC 1.036 (H) 03/09/2020 1945   PHURINE 5.0 03/09/2020 1945   GLUCOSEU >=500 (A) 03/09/2020 1945   HGBUR NEGATIVE 03/09/2020 1945   BILIRUBINUR NEGATIVE 03/09/2020 1945   KETONESUR 80 (A) 03/09/2020 1945   PROTEINUR 30 (A) 03/09/2020 1945   NITRITE NEGATIVE 03/09/2020 1945   LEUKOCYTESUR TRACE (A) 03/09/2020 1945   Sepsis Labs: (procalcitonin:4,lacticidven:4)  ) Recent Results (from the past 240 hour(s))  Resp Panel by RT-PCR (Flu A&B, Covid) Nasopharyngeal Swab     Status: None    Collection Time: 03/09/20  4:44 PM   Specimen: Nasopharyngeal Swab; Nasopharyngeal(NP) swabs in vial transport medium  Result Value Ref Range Status   SARS Coronavirus 2 by RT PCR NEGATIVE NEGATIVE Final    Comment: (NOTE) SARS-CoV-2 target nucleic acids are NOT DETECTED.  The SARS-CoV-2 RNA is generally detectable in upper respiratory specimens during the acute phase of infection. The lowest concentration of SARS-CoV-2 viral copies this assay can detect is 138 copies/mL. A negative result does not preclude SARS-Cov-2 infection and should not be used  as the sole basis for treatment or other patient management decisions. A negative result may occur with  improper specimen collection/handling, submission of specimen other than nasopharyngeal swab, presence of viral mutation(s) within the areas targeted by this assay, and inadequate number of viral copies(<138 copies/mL). A negative result must be combined with clinical observations, patient history, and epidemiological information. The expected result is Negative.  Fact Sheet for Patients:  BloggerCourse.com  Fact Sheet for Healthcare Providers:  SeriousBroker.it  This test is no t yet approved or cleared by the [place] FDA and  has been authorized for detection and/or diagnosis of SARS-CoV-2 by FDA under an Emergency Use Authorization (EUA). This EUA will remain  in effect (meaning this test can be used) for the duration of the COVID-19 declaration under Section 564(b)(1) of the Act, 21 U.S.C.section 360bbb-3(b)(1), unless the authorization is terminated  or revoked sooner.       Influenza A by PCR NEGATIVE NEGATIVE Final   Influenza B by PCR NEGATIVE NEGATIVE Final    Comment: (NOTE) The Xpert Xpress SARS-CoV-2/FLU/RSV plus assay is intended as an aid in the diagnosis of influenza from Nasopharyngeal swab specimens and should not be used as a sole basis for treatment.  Nasal washings and aspirates are unacceptable for Xpert Xpress SARS-CoV-2/FLU/RSV testing.  Fact Sheet for Patients: BloggerCourse.com  Fact Sheet for Healthcare Providers: SeriousBroker.it  This test is not yet approved or cleared by the [place] FDA and has been authorized for detection and/or diagnosis of SARS-CoV-2 by FDA under an Emergency Use Authorization (EUA). This EUA will remain in effect (meaning this test can be used) for the duration of the COVID-19 declaration under Section 564(b)(1) of the Act, 21 U.S.C. section 360bbb-3(b)(1), unless the authorization is terminated or revoked.  Performed at [facility], [address] [place], [place] [address]   Culture, blood (routine x 2)     Status: None (Preliminary result)   Collection Time: 03/09/20  4:44 PM   Specimen: BLOOD  Result Value Ref Range Status   Specimen Description BLOOD SITE NOT SPECIFIED  Final   Special Requests   Final    BOTTLES DRAWN AEROBIC AND ANAEROBIC Blood Culture results may not be optimal due to an inadequate volume of blood received in culture bottles   Culture   Final    NO GROWTH 4 DAYS Performed at [facility], [address] [place], [place] [address]    Report Status PENDING  Incomplete  Culture, blood (routine x 2)     Status: None (Preliminary result)   Collection Time: 03/09/20  4:55 PM   Specimen: BLOOD  Result Value Ref Range Status   Specimen Description BLOOD SITE NOT SPECIFIED  Final   Special Requests   Final    BOTTLES DRAWN AEROBIC AND ANAEROBIC Blood Culture results may not be optimal due to an inadequate volume of blood received in culture bottles   Culture   Final    NO GROWTH 4 DAYS Performed at [facility], [address], [place], [place] [address]    Report Status PENDING  Incomplete  Culture, Urine     Status: None   Collection Time: 03/11/20  3:20 AM   Specimen: Urine, Random  Result  Value Ref Range Status   Specimen Description URINE, RANDOM  Final   Special Requests NONE  Final   Culture   Final    NO GROWTH Performed at [facility], [address], [place], [place] [address]    Report Status 03/12/2020 FINAL  Final      Radiology Studies: MR BRAIN WO CONTRAST  Result Date: 03/12/2020 CLINICAL DATA:  Mental status changes of unknown cause EXAM: MRI HEAD WITHOUT CONTRAST TECHNIQUE: Multiplanar, multiecho pulse sequences of the brain and surrounding structures were obtained without intravenous contrast. COMPARISON:  Head CT March 09, 2020. FINDINGS: Brain: No acute infarction, hemorrhage, hydrocephalus, extra-axial collection or mass lesion. Rare foci of T2 hyperintensity are seen within the white matter of the cerebral hemispheres, nonspecific. Vascular: Normal flow voids. Skull and upper cervical spine: Normal marrow signal. Sinuses/Orbits: Mild mucosal thickening throughout the paranasal sinuses. The orbits are maintained. Other: None. IMPRESSION: 1. Rare foci of T2 hyperintensity within the white matter of the cerebral hemispheres are nonspecific but may be seen in the setting of chronic small vessel ischemic disease, migraine headaches and post inflammatory/infectious process. 2. Mild paranasal sinus disease. Electronically Signed   By: Baldemar Lenis M.D.   On: 03/12/2020 14:38   EEG adult  Result Date: 03/12/2020 Charlsie Quest, MD     03/12/2020 12:57 PM Patient Name: Garvis Downum MRN: [id number] Epilepsy Attending: Charlsie Quest Referring Physician/Provider: Dr Edsel Petrin Date: 03/12/2020 Duration: 28.15 mins Patient history: 34 year old male with altered mental status.  EEG to evaluate for seizures. Level of alertness: Awake, asleep AEDs during EEG study: None Technical aspects: This EEG study was done with scalp electrodes positioned according to the 10-20 International system of electrode placement. Electrical activity was acquired  at a sampling rate of 500Hz  and reviewed with a high frequency filter of 70Hz  and a low frequency filter of 1Hz . EEG data were recorded continuously and digitally stored. Description: The posterior dominant rhythm consists of 9-10 Hz activity of moderate voltage (25-35 uV) seen predominantly in posterior head regions, symmetric and reactive to eye opening and eye closing. Sleep was characterized by vertex waves, sleep spindles (12 to 14 Hz), maximal frontocentral region.   Physiologic photic driving was not seen during photic stimulation.  Hyperventilation was not performed.   IMPRESSION: This study is within normal limits. No seizures or epileptiform discharges were seen throughout the recording. Priyanka     Scheduled Meds: . Chlorhexidine Gluconate Cloth  6 each Topical Daily  . enoxaparin (LOVENOX) injection  40 mg Subcutaneous Q24H  . escitalopram  10 mg Oral Daily  . feeding supplement  237 mL Oral BID BM  . insulin aspart  0-15 Units Subcutaneous TID WC  . insulin aspart  2 Units Subcutaneous TID WC  . insulin glargine  10 Units Subcutaneous BID  . LORazepam  0.5 mg Oral BID  . risperiDONE  2 mg Oral BID  . tamsulosin  0.4 mg Oral Daily  . thiamine  100 mg Oral Daily   Continuous Infusions: . sodium chloride 100 mL/hr at 03/13/20 1407     LOS: 5 days   Time Spent in minutes   45 minutes  Tashawn Greff D.O. on 03/14/2020 at 9:46 AM  Between 7am to 7pm - Please see pager noted on [email or web address]  After 7pm go to [email or web address]  And look for the night coverage person covering for me after hours  [facility] Office  [phone]

## 2020-03-14 NOTE — Evaluation (Signed)
Clinical/Bedside Swallow Evaluation Patient Details  Name: Garrett [name] MRN: [id number] Date of Birth: January 09, 1986  Today's Date: 03/14/2020 Time: SLP Start Time (ACUTE ONLY): 1015 SLP Stop Time (ACUTE ONLY): 1035 SLP Time Calculation (min) (ACUTE ONLY): 20 min  Past Medical History:  Past Medical History:  Diagnosis Date  . Depression    Past Surgical History: History reviewed. No pertinent surgical history. HPI:  Pt is a 34 y.o. 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  MRI brain: Rare foci of T2 hyperintensity within the white matter of the cerebral hemispheres; nonspecific but may be seen in the setting of chronic small vessel ischemic disease, migraine headaches and post inflammatory/infectious process. EEG normal. Psych was consulted and recommened inpatient admission to psych hospital.   Assessment / Plan / Recommendation Clinical Impression  Pt was seen for bedside swallow evaluation. [employer] [name] interpreter, Myin (ID# [phone]), was used for translation and interpretation. Pt denied a history of dysphagia or any acute changes in his swallow function. Oral mechanism exam was [facility]. Dentition was reduced with multiple with some absent molars. He tolerated all solids and liquids without signs or symptoms of oropharyngeal dysphagia. Mastication was [facility] despite reduced dentition. It is recommended that a regular texture diet with thin liquids be continued at this time and further skilled SLP services are not clinically indicated for swallowing. SLP Visit Diagnosis: Dysphagia, unspecified (R13.10)    Aspiration Risk  No limitations    Diet Recommendation Regular;Thin liquid   Liquid Administration via: Straw;Cup Medication Administration: Whole meds with liquid Supervision: Patient able to self feed    Other   Recommendations Oral Care Recommendations: Oral care BID   Follow up Recommendations None      Frequency and Duration            Prognosis        Swallow Study   General Date of Onset: 03/13/20 HPI: Pt is a 34 y.o. 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  MRI brain: Rare foci of T2 hyperintensity within the white matter of the cerebral hemispheres; nonspecific but may be seen in the setting of chronic small vessel ischemic disease, migraine headaches and post inflammatory/infectious process. EEG normal. Psych was consulted and recommened inpatient admission to psych hospital. Type of Study: Bedside Swallow Evaluation Previous Swallow Assessment: None Diet Prior to this Study: Thin liquids;Regular Temperature Spikes Noted: No Respiratory Status: Room air History of Recent Intubation: No Behavior/Cognition: Alert;Cooperative;Pleasant mood Oral Cavity Assessment: Within Functional Limits Oral Care Completed by SLP: No Oral Cavity - Dentition: Adequate natural dentition Vision: Functional for self-feeding Self-Feeding Abilities: Able to feed self Patient Positioning: Upright in bed;Postural control adequate for testing Baseline Vocal Quality: Normal Volitional Cough: Strong Volitional Swallow: Able to elicit    Oral/Motor/Sensory Function Overall Oral Motor/Sensory Function: Within functional limits   Ice Chips Ice chips: Within functional limits Presentation: Spoon   Thin Liquid Thin Liquid: Within functional limits Presentation: Straw;Cup    Nectar Thick Nectar Thick Liquid: Not tested   Honey Thick Honey Thick Liquid: Not tested   Puree Puree: Within functional limits Presentation: Spoon   Solid     Solid: Within functional limits Presentation: Self Fed  Samiyah Stupka I. Vear Clock, MS, CCC-SLP Acute  Rehabilitation Services Office number [phone] Pager [phone]   Scheryl Marten 03/14/2020,10:36 AM

## 2020-03-15 DIAGNOSIS — E119 Type 2 diabetes mellitus without complications: Secondary | ICD-10-CM

## 2020-03-15 DIAGNOSIS — R4182 Altered mental status, unspecified: Secondary | ICD-10-CM

## 2020-03-15 DIAGNOSIS — E1110 Type 2 diabetes mellitus with ketoacidosis without coma: Principal | ICD-10-CM

## 2020-03-15 LAB — COMPREHENSIVE METABOLIC PANEL
ALT: 80 U/L — ABNORMAL HIGH (ref 0–44)
AST: 68 U/L — ABNORMAL HIGH (ref 15–41)
Albumin: 2.6 g/dL — ABNORMAL LOW (ref 3.5–5.0)
Alkaline Phosphatase: 58 U/L (ref 38–126)
Anion gap: 7 (ref 5–15)
BUN: 14 mg/dL (ref 6–20)
CO2: 27 mmol/L (ref 22–32)
Calcium: 8.6 mg/dL — ABNORMAL LOW (ref 8.9–10.3)
Chloride: 102 mmol/L (ref 98–111)
Creatinine, Ser: 0.65 mg/dL (ref 0.61–1.24)
GFR, Estimated: >60 mL/min (ref >60)
Glucose, Bld: 257 mg/dL — ABNORMAL HIGH (ref 70–99)
Potassium: 4 mmol/L (ref 3.5–5.1)
Sodium: 136 mmol/L (ref 135–145)
Total Bilirubin: 1 mg/dL (ref 0.3–1.2)
Total Protein: 6 g/dL — ABNORMAL LOW (ref 6.5–8.1)

## 2020-03-15 LAB — GC/CHLAMYDIA PROBE AMP (~~LOC~~) NOT AT ARMC
Chlamydia: NEGATIVE
Comment: NEGATIVE
Comment: NORMAL
Neisseria Gonorrhea: NEGATIVE

## 2020-03-15 LAB — HEMOGLOBIN AND HEMATOCRIT, BLOOD
HCT: 24.9 % — ABNORMAL LOW (ref 39.0–52.0)
Hemoglobin: 8.7 g/dL — ABNORMAL LOW (ref 13.0–17.0)

## 2020-03-15 LAB — GLUCOSE, CAPILLARY
Glucose-Capillary: 245 mg/dL — ABNORMAL HIGH (ref 70–99)
Glucose-Capillary: 261 mg/dL — ABNORMAL HIGH (ref 70–99)
Glucose-Capillary: 157 mg/dL — ABNORMAL HIGH (ref 70–99)
Glucose-Capillary: 226 mg/dL — ABNORMAL HIGH (ref 70–99)

## 2020-03-15 MED ORDER — SODIUM CHLORIDE 0.9 % IV SOLN: INTRAVENOUS | Status: DC | PRN

## 2020-03-15 NOTE — Progress Notes (Addendum)
PROGRESS NOTE    Pershing Skidmore  HYQ:[id number] DOB: 1985/12/09 DOA: 03/09/2020 PCP: Patient, No Pcp Per   Brief Narrative:  HPI on 03/09/2020 by Dr. John Giovanni Dewell Monnier is a 34 y.o. 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  Per triage note, family reported that patient woke up altered this morning and has not been communicating as per usual.  Patient was not able to answer orientation questions appropriately at triage.  Interpreter services used to obtain history as patient speaks [name].  Patient appears confused and oriented to person and place only.  He is not sure why he is here and has no complaints.  Reports history of diabetes but does not know when he was diagnosed and states he is not on any medications.  Denies fevers, cough, shortness of breath, chest pain, nausea, vomiting, abdominal pain, or diarrhea.  No additional history could be obtained from him.  Interim history Patient admitted with new onset diabetes found to be in DKA.  Has been transitioned off of insulin drip and onto Lantus and NovoLog.  Patient also with acute metabolic encephalopathy. Not sure if his mental status changes may be due to MDD. Psych consulted and patient will need inpatient psych admission when medically stable.   Assessment & Plan   DKA/ Uncontrolled-untreated diabetes mellitus, type II -Discussed with patient and cousin- he was diagnosed with diabetes over a year ago and was supposed to be on medications but unsure of which medications -Patient presented with altered mental status -Last hemoglobin A1c was 5.0 in March 2019 -On admission blood glucose was 343, bicarb of 20, anion gap of 21, ketones noted on urinalysis, beta hydroxybutyrate acid > 8, VBG showed a pH of 7.36 -Hemoglobin A1c 13.1 -Patient was started on IV insulin per  DKA protocol -Has now been transitioned to NovoLog and Lantus -On 12/9, tried to explain this to the patient using interpreter however patient had no questions  Acute metabolic encephalopathy -Possibly secondary to DKA vs psychosis -The gap is closed and patient has been transitioned to NovoLog and Lantus, he continues to be altered -CT head unremarkable for acute intercranial normality -Ammonia level within normal limits -UDS unremarkable, blood alcohol level undetectable -UA showed trace leukocytes, 11-20 WBCs, no bacteria -Urine culture showed no growth  -CXR unremarkable  -MRI brain: Rare foci of T2 hyperintensity within the white matter of cerebral hemispheres.  Mild para nasal sinus disease. -Discussed MRI findings with neurology, Dr. Amada Jupiter, MRI is unremarkable for a CVA.  Findings are nonspecific, no acute intervention needed. -EEG: Study within normal limits.  No seizure or left form discharges seen throughout recording. -Vitamin B12, folate, TSH levels within normal limits -Vitamin B1 level pending -Continue thiamine supplementation -Continue to monitor closely -Improved, patient currently alert and oriented x4.  Appears to be at baseline. -Discussed with patient's cousin on 03/13/2020, it appears the patient has had similar episodes of being nonverbal and altered in the past approximately 3-4 times and has even had to be admitted to inpatient psych. -Psych reconsulted and appreciated  Elevated LFTs -AST 46, ALT 97, total bilirubin 1.7 on admission- trending downward -Hepatitis panel unremarkable -Abdominal ultrasound was normal -Continue to monitor CMP  Abnormal urinalysis -UA showed trace leukocytes, 11-20 WBCs, no bacteria -Currently patient afebrile with no leukocytosis -Urine culture showed no growth -Overnight 12/8, patient was noted  to have purulent drainage from his urethra.  Have ordered G&C, HIV.  -HIV unremarkable  -G&C pending  Acute Urinary  retention -Continued to have urinary retention, patient had 3 in/outs -Foley catheter was placed, however voiding trial was successful on 12/12, catheter removed.  -Continue Flomax  Distended bladder on CT -CT abdomen pelvis showed severely distended urinary bladder with mild bilateral collecting system dilatation. -Patient did void spontaneously in the ED -Continue to monitor and do bladder scans every 4 hours  MDD with psychotic features -Continue Lexapro, Risperdal, trazodone -Question if this is also adding to patient's acute metabolic encephalopathy as he appears to have ran out of his medications -Patient remains alert, however, unresponsive at this time and just stares  -Psychiatry consulted and appreciated-recommended continuing home medications.  Patient may benefit from different anti-psychotic med given that risperidone can cause hyperglycemia/diabetes. Ativan 0.5mg  PO BID started. Inpatient psychiatric admission recommended once patient is medically cleared. Reconsult once patient is able to participate in evaluation.  -As above, discussed with patient's cousin.  This has occurred in the past and patient has required inpatient psych admission -have reconsulted psychiatry  Hypokalemia -resolved with replacement -will give  -will supplement IV -Continue monitor BMP  Possible dysphagia -RN reports patient with coughing after swallowing -Speech therapy consulted for swallow eval -Currently on carb modified diet and tolerating well  Normocytic anemia -Hemoglobin on admission was 14 however down to 8.7 today -Possibly dilutional given IV fluids -Anemia panel obtained, patient does have adequate iron as well as storage -FOBT pending -Discontinued IVF -Continue to monitor CBC  Back pain -Patient states he is now having left sided lower back pain -Continue lidocaine patch, ibuprofen -Will also order Voltaren gel and oxycodone as needed  DVT Prophylaxis  Lovenox  Code  Status: Full  Family Communication: None at bedside.    Disposition Plan:  Status is: Inpatient  Remains inpatient appropriate because:Altered mental status-will need inpatient psychiatric admission   Dispo: The patient is from: Home              Anticipated d/c is to: Inpatient psych              Anticipated d/c date is: 2 days              Patient currently is not medically stable to d/c.   Consultants None  Procedures  Abd [place]  Antibiotics   Anti-infectives (From admission, onward)   None      Subjective:   Jodeci Heiny seen and examined today.  Complains of lower back pain on the left side.  Feels some relief with lidocaine patch and ibuprofen.  Denies current chest pain or shortness of breath, abdominal pain, nausea vomiting, dizziness or headache.  Wonders if he will be in the hospital for a long time.  Objective:   Vitals:   03/14/20 1930 03/14/20 2050 03/14/20 2215 03/15/20 0453  BP:  117/80  102/65  Pulse:  90  90  Resp: 18 18 15 16   Temp:  98.2 F (36.8 C)  97.7 F (36.5 C)  TempSrc:  Axillary  Axillary  SpO2:  99% 99% 100%  Weight:      Height:        Intake/Output Summary (Last 24 hours) at 03/15/2020 0920 Last data filed at 03/15/2020 0756 Gross per 24 hour  Intake 4578.67 ml  Output 3000 ml  Net 1578.67 ml   Filed Weights   03/11/20 1050  Weight: 54.2 kg   Exam  General: Well developed, chronically ill-appearing, NAD  HEENT: NCAT, mucous membranes moist.  Poor dentition  Cardiovascular: S1 S2 auscultated, RRR  Respiratory: Clear to auscultation bilaterally, no wheezing  Abdomen: Soft, nontender, nondistended, + bowel sounds  Extremities: warm dry without cyanosis clubbing or edema  Neuro: AAOx3, nonfocal  Psych: appropriate mood and affect  Data Reviewed: I have personally reviewed following labs and imaging studies  CBC: Recent Labs  Lab 03/09/20 1411 03/09/20 1702 03/11/20 0107 03/14/20 0422 03/15/20 0310  WBC 7.6  --   6.2 6.9  --   HGB 14.7 14.3 12.3* 9.4* 8.7*  HCT 42.8 42.0 35.7* 27.1* 24.9*  MCV 94.5  --  95.2 96.4  --   PLT 270  --  164 124*  --    Basic Metabolic Panel: Recent Labs  Lab 03/11/20 0107 03/12/20 1032 03/12/20 1033 03/13/20 0746 03/14/20 0422 03/15/20 0310  NA 140  --  143 141 139 136  K 3.6  --  3.3* 3.4* 3.5 4.0  CL 101  --  106 106 105 102  CO2 28  --  GLUCOSE 351*  --  163* 172* 139* 257*  BUN 11  --  CREATININE 0.75  --  0.55* 0.47* 0.56* 0.65  CALCIUM 9.7  --  9.0 8.8* 8.5* 8.6*  MG  --  1.8  --   --   --   --    GFR: Estimated Creatinine Clearance: 97.9 mL/min (by C-G formula based on SCr of 0.65 mg/dL). Liver Function Tests: Recent Labs  Lab 03/09/20 1411 03/11/20 0107 03/12/20 1033 03/14/20 0422 03/15/20 0310  AST 46* 70* 51* 43* 68*  ALT 97* 90* 91* 65* 80*  ALKPHOS 86 64 61 48 58  BILITOT 1.7* 1.8* 1.4* 1.3* 1.0  PROT 9.1* 7.0 6.7 5.6* 6.0*  ALBUMIN 4.1 3.3* 3.1* 2.6* 2.6*   No results for input(s): LIPASE, AMYLASE in the last 168 hours. Recent Labs  Lab 03/09/20 1644  AMMONIA 18   Coagulation Profile: No results for input(s): INR, PROTIME in the last 168 hours. Cardiac Enzymes: No results for input(s): CKTOTAL, CKMB, CKMBINDEX, TROPONINI in the last 168 hours. BNP (last 3 results) No results for input(s): PROBNP in the last 8760 hours. HbA1C: No results for input(s): HGBA1C in the last 72 hours. CBG: Recent Labs  Lab 03/14/20 0721 03/14/20 1157 03/14/20 1609 03/14/20 2054 03/15/20 0730  GLUCAP 136* 277* 348* 262* 245*   Lipid Profile: No results for input(s): CHOL, HDL, LDLCALC, TRIG, CHOLHDL, LDLDIRECT in the last 72 hours. Thyroid Function Tests: Recent Labs    03/12/20 1033  TSH 2.181   Anemia Panel: Recent Labs    03/12/20 1033 03/14/20 0718  VITAMINB12 1,187* 750  FOLATE 21.1 16.5  FERRITIN  --  445*  TIBC  --  256  IRON  --  163  RETICCTPCT  --  3.5*   Urine analysis:    Component  Value Date/Time   COLORURINE YELLOW 03/09/2020 1945   APPEARANCEUR CLEAR 03/09/2020 1945   LABSPEC 1.036 (H) 03/09/2020 1945   PHURINE 5.0 03/09/2020 1945   GLUCOSEU >=500 (A) 03/09/2020 1945   HGBUR NEGATIVE 03/09/2020 1945   BILIRUBINUR NEGATIVE 03/09/2020 1945   KETONESUR 80 (A) 03/09/2020 1945   PROTEINUR 30 (A) 03/09/2020 1945   NITRITE NEGATIVE 03/09/2020 1945   LEUKOCYTESUR TRACE (A) 03/09/2020 1945   Sepsis Labs: (procalcitonin:4,lacticidven:4)  ) Recent Results (from the past 240 hour(s))  Resp Panel by RT-PCR (Flu A&B, Covid) Nasopharyngeal Swab     Status: None   Collection Time: 03/09/20  4:44 PM   Specimen: Nasopharyngeal Swab; Nasopharyngeal(NP) swabs in vial transport medium  Result Value Ref Range Status   SARS Coronavirus 2 by RT PCR NEGATIVE NEGATIVE Final    Comment: (NOTE) SARS-CoV-2 target nucleic acids are NOT DETECTED.  The SARS-CoV-2 RNA is generally detectable in upper respiratory specimens during the acute phase of infection. The lowest concentration of SARS-CoV-2 viral copies this assay can detect is 138 copies/mL. A negative result does not preclude SARS-Cov-2 infection and should not be used as the sole basis for treatment or other patient management decisions. A negative result may occur with  improper specimen collection/handling, submission of specimen other than nasopharyngeal swab, presence of viral mutation(s) within the areas targeted by this assay, and inadequate number of viral copies(<138 copies/mL). A negative result must be combined with clinical observations, patient history, and epidemiological information. The expected result is Negative.  Fact Sheet for Patients:  BloggerCourse.comhttps://www.fda.gov/media/152166/download  Fact Sheet for Healthcare Providers:  SeriousBroker.ithttps://www.fda.gov/media/152162/download  This test is no t yet approved or cleared by the [place] FDA and  has been authorized for detection and/or diagnosis of  SARS-CoV-2 by FDA under an Emergency Use Authorization (EUA). This EUA will remain  in effect (meaning this test can be used) for the duration of the COVID-19 declaration under Section 564(b)(1) of the Act, 21 U.S.C.section 360bbb-3(b)(1), unless the authorization is terminated  or revoked sooner.       Influenza A by PCR NEGATIVE NEGATIVE Final   Influenza B by PCR NEGATIVE NEGATIVE Final    Comment: (NOTE) The Xpert Xpress SARS-CoV-2/FLU/RSV plus assay is intended as an aid in the diagnosis of influenza from Nasopharyngeal swab specimens and should not be used as a sole basis for treatment. Nasal washings and aspirates are unacceptable for Xpert Xpress SARS-CoV-2/FLU/RSV testing.  Fact Sheet for Patients: BloggerCourse.comhttps://www.fda.gov/media/152166/download  Fact Sheet for Healthcare Providers: SeriousBroker.ithttps://www.fda.gov/media/152162/download  This test is not yet approved or cleared by the [place] FDA and has been authorized for detection and/or diagnosis of SARS-CoV-2 by FDA under an Emergency Use Authorization (EUA). This EUA will remain in effect (meaning this test can be used) for the duration of the COVID-19 declaration under Section 564(b)(1) of the Act, 21 U.S.C. section 360bbb-3(b)(1), unless the authorization is terminated or revoked.  Performed at [facility], [address] [place], [place] [address]   Culture, blood (routine x 2)     Status: None   Collection Time: 03/09/20  4:44 PM   Specimen: BLOOD  Result Value Ref Range Status   Specimen Description BLOOD SITE NOT SPECIFIED  Final   Special Requests   Final    BOTTLES DRAWN AEROBIC AND ANAEROBIC Blood Culture results may not be optimal due to an inadequate volume of blood received in culture bottles   Culture   Final    NO GROWTH 5 DAYS Performed at [facility], [address] [place], [place] [address]    Report Status 03/14/2020 FINAL  Final  Culture, blood (routine x 2)     Status: None    Collection Time: 03/09/20  4:55 PM   Specimen: BLOOD  Result Value Ref Range Status   Specimen Description BLOOD SITE NOT SPECIFIED  Final   Special Requests   Final    BOTTLES DRAWN AEROBIC AND ANAEROBIC Blood Culture results may not be optimal due to an inadequate  volume of blood received in culture bottles   Culture   Final    NO GROWTH 5 DAYS Performed at [facility], [address], [place], [place] [address]    Report Status 03/14/2020 FINAL  Final  Culture, Urine     Status: None   Collection Time: 03/11/20  3:20 AM   Specimen: Urine, Random  Result Value Ref Range Status   Specimen Description URINE, RANDOM  Final   Special Requests NONE  Final   Culture   Final    NO GROWTH Performed at [facility], [address], [place], [place] [address]    Report Status 03/12/2020 FINAL  Final      Radiology Studies: No results found.   Scheduled Meds: . enoxaparin (LOVENOX) injection  40 mg Subcutaneous Q24H  . escitalopram  10 mg Oral Daily  . feeding supplement  237 mL Oral BID BM  . insulin aspart  0-15 Units Subcutaneous TID WC  . insulin aspart  2 Units Subcutaneous TID WC  . insulin glargine  10 Units Subcutaneous BID  . lidocaine  1 patch Transdermal QHS  . LORazepam  0.5 mg Oral BID  . risperiDONE  2 mg Oral BID  . tamsulosin  0.4 mg Oral Daily  . thiamine  100 mg Oral Daily   Continuous Infusions: . sodium chloride       LOS: 6 days   Time Spent in minutes   30 minutes  Cleo Villamizar D.O. on 03/15/2020 at 9:20 AM  Between 7am to 7pm - Please see pager noted on [email or web address]  After 7pm go to [email or web address]  And look for the night coverage person covering for me after hours  [facility] Office  [phone]

## 2020-03-15 NOTE — Consult Note (Signed)
[facility] Face-to-Face Psychiatry Consult   Reason for Consult:  Depression with psychosis Referring Physician:  Dr. Catha Gosselin Patient Identification: Garrett [name] MRN:  [id number] Principal Diagnosis: DKA (diabetic ketoacidosis) (HCC) Diagnosis:   Patient Active Problem List   Diagnosis Date Noted  . DKA (diabetic ketoacidosis) (HCC) [E11.10] 03/09/2020  . Diabetes mellitus, new onset (HCC) [E11.9] 03/09/2020  . Acute metabolic encephalopathy [G93.41] 03/09/2020  . Elevated LFTs [R79.89] 03/09/2020  . Major depressive disorder, recurrent severe without psychotic features (HCC) [F33.2] 06/20/2017  . MDD (major depressive disorder), recurrent, severe, with psychosis (HCC) [F33.3] 06/19/2017    Total Time spent with patient: 30 minutes  Subjective:   Garrett [name] is a 34 y.o. male patient admitted with depression with psychosis. Communication is used through interpreter services. Patient reports discontinuation of his medication because he forgot. He does admit to psychosis and disorientation at times. Today on evaluation he is alert and oriented x 3, calm and cooperative. He denies suicidal ideations, homicidal ideations and or hallucinations.   SWF:UXNA Garrett [name] is a 34 y.o. 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  Per triage note, family reported that patient woke up altered this morning and has not been communicating as per usual.  Patient was not able to answer orientation questions appropriately at triage.  Interpreter services used to obtain history as patient speaks [name].  Patient appears confused and oriented to person and place only.  He is not sure why he is here and has no complaints.  Reports history of diabetes but does not know when he was diagnosed and states he is not on any medications.  Denies fevers, cough, shortness of  breath, chest pain, nausea, vomiting, abdominal pain, or diarrhea.  No additional history could be obtained from him.    [age] year old male who presented with altered mental status after discontinuing his psychotropic medications. He is unable to recall the last time he took them. He states he forgot and never took them. He has improved some since his admission 6 days, with resumption of his psychotropic medications. This consult was completed by means of telepsychitriaty so unable to complete AIMS. Initially he was hearing voices but denies this today. He minimizes any additional depressive symptoms at this time. He denies suicidal ideation, homicidal ideation, or hallucination. hallucinations. No homicidal ideations or substance abuse.  Past Psychiatric History: depression with psychosis. Previously taking risperdal 2mg  po BID, lexapro 10mg  po daily.   Risk to Self:   Risk to Others:  None. Denies HI.  Prior Inpatient Therapy:   [facility] 2019 Prior Outpatient Therapy:  Denies   Past Medical History:  Past Medical History:  Diagnosis Date  . Depression    History reviewed. No pertinent surgical history. Family History: History reviewed. No pertinent family history. Family Psychiatric  History: none Social History:  Social History   Substance and Sexual Activity  Alcohol Use None     Social History   Substance and Sexual Activity  Drug Use Not on file    Social History   Socioeconomic History  . Marital status: Single    Spouse name: Not on file  . Number of children: Not on file  . Years of education: Not on file  . Highest education level: Not on file  Occupational History  . Not on file  Tobacco Use  . Smoking  status: Never Smoker  . Smokeless tobacco: Never Used  Substance and Sexual Activity  . Alcohol use: Not on file  . Drug use: Not on file  . Sexual activity: Not on file  Other Topics Concern  . Not on file  Social History Narrative  . Not on file   Social  Determinants of Health   Financial Resource Strain: Not on file  Food Insecurity: Not on file  Transportation Needs: Not on file  Physical Activity: Not on file  Stress: Not on file  Social Connections: Not on file   Additional Social History: N/A    Allergies:  No Known Allergies  Labs:  Results for orders placed or performed during the hospital encounter of 03/09/20 (from the past 48 hour(s))  Glucose, capillary     Status: Abnormal   Collection Time: 03/13/20  8:52 PM  Result Value Ref Range   Glucose-Capillary 120 (H) 70 - 99 mg/dL    Comment: Glucose reference range applies only to samples taken after fasting for at least 8 hours.  Comprehensive metabolic panel     Status: Abnormal   Collection Time: 03/14/20  4:22 AM  Result Value Ref Range   Sodium 139 135 - 145 mmol/L   Potassium 3.5 3.5 - 5.1 mmol/L   Chloride 105 98 - 111 mmol/L   CO2 25 22 - 32 mmol/L   Glucose, Bld 139 (H) 70 - 99 mg/dL    Comment: Glucose reference range applies only to samples taken after fasting for at least 8 hours.   BUN 9 6 - 20 mg/dL   Creatinine, Ser 1.610.56 (L) 0.61 - 1.24 mg/dL   Calcium 8.5 (L) 8.9 - 10.3 mg/dL   Total Protein 5.6 (L) 6.5 - 8.1 g/dL   Albumin 2.6 (L) 3.5 - 5.0 g/dL   AST 43 (H) 15 - 41 U/L   ALT 65 (H) 0 - 44 U/L   Alkaline Phosphatase 48 38 - 126 U/L   Total Bilirubin 1.3 (H) 0.3 - 1.2 mg/dL   GFR, Estimated >09>60 >60>60 mL/min    Comment: (NOTE) Calculated using the CKD-EPI Creatinine Equation (2021)    Anion gap 9 5 - 15    Comment: Performed at [facility], [address] [place], [place] [address]  CBC     Status: Abnormal   Collection Time: 03/14/20  4:22 AM  Result Value Ref Range   WBC 6.9 4.0 - 10.5 K/uL   RBC 2.81 (L) 4.22 - 5.81 MIL/uL   Hemoglobin 9.4 (L) 13.0 - 17.0 g/dL   HCT 81.127.1 (L) 91.439.0 - 78.252.0 %   MCV 96.4 80.0 - 100.0 fL   MCH 33.5 26.0 - 34.0 pg   MCHC 34.7 30.0 - 36.0 g/dL   RDW 95.611.5 21.311.5 - 08.615.5 %   Platelets 124 (L) 150 - 400 K/uL    nRBC 0.0 0.0 - 0.2 %    Comment: Performed at [facility], [address] [place], [place] [address]  Vitamin B12     Status: None   Collection Time: 03/14/20  7:18 AM  Result Value Ref Range   Vitamin B-12 750 180 - 914 pg/mL    Comment: (NOTE) This assay is not validated for testing neonatal or myeloproliferative syndrome specimens for Vitamin B12 levels. Performed at [facility], [address] [place], [place] [address]   Folate     Status: None   Collection Time: 03/14/20  7:18 AM  Result  Value Ref Range   Folate 16.5 >5.9 ng/mL    Comment: Performed at [facility], [address], [place], [place] [address]  Iron and TIBC     Status: Abnormal   Collection Time: 03/14/20  7:18 AM  Result Value Ref Range   Iron 163 45 - 182 ug/dL   TIBC 671 245 - 809 ug/dL   Saturation Ratios 64 (H) 17.9 - 39.5 %   UIBC 93 ug/dL    Comment: Performed at [facility], [address], [place], [place] [address]  Ferritin     Status: Abnormal   Collection Time: 03/14/20  7:18 AM  Result Value Ref Range   Ferritin 445 (H) 24 - 336 ng/mL    Comment: Performed at [facility], [address] [place], [place] [address]  Reticulocytes     Status: Abnormal   Collection Time: 03/14/20  7:18 AM  Result Value Ref Range   Retic Ct Pct 3.5 (H) 0.4 - 3.1 %   RBC. 2.81 (L) 4.22 - 5.81 MIL/uL   Retic Count, Absolute 97.2 19.0 - 186.0 K/uL   Immature Retic Fract 10.3 2.3 - 15.9 %    Comment: Performed at [facility], [address] [place], [place] [address]  Glucose, capillary     Status: Abnormal   Collection Time: 03/14/20  7:21 AM  Result Value Ref Range   Glucose-Capillary 136 (H) 70 - 99 mg/dL    Comment: Glucose reference range applies only to samples taken after fasting for at least 8 hours.  Glucose, capillary     Status: Abnormal   Collection Time: 03/14/20 11:57 AM  Result Value Ref Range   Glucose-Capillary 277 (H) 70 - 99 mg/dL    Comment:  Glucose reference range applies only to samples taken after fasting for at least 8 hours.  Glucose, capillary     Status: Abnormal   Collection Time: 03/14/20  4:09 PM  Result Value Ref Range   Glucose-Capillary 348 (H) 70 - 99 mg/dL    Comment: Glucose reference range applies only to samples taken after fasting for at least 8 hours.  Glucose, capillary     Status: Abnormal   Collection Time: 03/14/20  8:54 PM  Result Value Ref Range   Glucose-Capillary 262 (H) 70 - 99 mg/dL    Comment: Glucose reference range applies only to samples taken after fasting for at least 8 hours.  Comprehensive metabolic panel     Status: Abnormal   Collection Time: 03/15/20  3:10 AM  Result Value Ref Range   Sodium 136 135 - 145 mmol/L   Potassium 4.0 3.5 - 5.1 mmol/L   Chloride 102 98 - 111 mmol/L   CO2 27 22 - 32 mmol/L   Glucose, Bld 257 (H) 70 - 99 mg/dL    Comment: Glucose reference range applies only to samples taken after fasting for at least 8 hours.   BUN 14 6 - 20 mg/dL   Creatinine, Ser 4.19 0.61 - 1.24 mg/dL   Calcium 8.6 (L) 8.9 - 10.3 mg/dL   Total Protein 6.0 (L) 6.5 - 8.1 g/dL   Albumin 2.6 (L) 3.5 - 5.0 g/dL   AST 68 (H) 15 - 41 U/L   ALT 80 (H) 0 - 44 U/L   Alkaline Phosphatase 58 38 - 126 U/L   Total Bilirubin 1.0 0.3 - 1.2 mg/dL   GFR, Estimated >37 >90 mL/min    Comment: (NOTE) Calculated  using the CKD-EPI Creatinine Equation (2021)    Anion gap 7 5 - 15    Comment: Performed at [facility], [address] [place], [place] [address]  Hemoglobin and hematocrit, blood     Status: Abnormal   Collection Time: 03/15/20  3:10 AM  Result Value Ref Range   Hemoglobin 8.7 (L) 13.0 - 17.0 g/dL   HCT 60.4 (L) 54.0 - 98.1 %    Comment: Performed at [facility], [address] [place], [place] [address]  Glucose, capillary     Status: Abnormal   Collection Time: 03/15/20  7:30 AM  Result Value Ref Range   Glucose-Capillary 245 (H) 70 - 99 mg/dL    Comment: Glucose  reference range applies only to samples taken after fasting for at least 8 hours.  Glucose, capillary     Status: Abnormal   Collection Time: 03/15/20 12:29 PM  Result Value Ref Range   Glucose-Capillary 261 (H) 70 - 99 mg/dL    Comment: Glucose reference range applies only to samples taken after fasting for at least 8 hours.  Glucose, capillary     Status: Abnormal   Collection Time: 03/15/20  5:00 PM  Result Value Ref Range   Glucose-Capillary 157 (H) 70 - 99 mg/dL    Comment: Glucose reference range applies only to samples taken after fasting for at least 8 hours.    Current Facility-Administered Medications  Medication Dose Route Frequency Provider Last Rate Last Admin  . 0.9 %  sodium chloride infusion   Intravenous PRN Mikhail, Maryann, DO      . dextrose 50 % solution 0-50 mL  0-50 mL Intravenous PRN John Giovanni, MD      . enoxaparin (LOVENOX) injection 40 mg  40 mg Subcutaneous Q24H John Giovanni, MD   40 mg at 03/14/20 2222  . escitalopram (LEXAPRO) tablet 10 mg  10 mg Oral Daily Edsel Petrin, DO   10 mg at 03/15/20 0800  . feeding supplement (ENSURE ENLIVE / ENSURE PLUS) liquid 237 mL  237 mL Oral BID BM Mikhail, Maryann, DO   237 mL at 03/15/20 1500  . ibuprofen (ADVIL) tablet 400 mg  400 mg Oral Q8H PRN John Giovanni, MD   400 mg at 03/14/20 2335  . insulin aspart (novoLOG) injection 0-15 Units  0-15 Units Subcutaneous TID WC John Giovanni, MD   3 Units at 03/15/20 1841  . insulin aspart (novoLOG) injection 2 Units  2 Units Subcutaneous TID WC Edsel Petrin, DO   2 Units at 03/15/20 1842  . insulin glargine (LANTUS) injection 10 Units  10 Units Subcutaneous BID Edsel Petrin, DO   10 Units at 03/15/20 0800  . lidocaine (LIDODERM) 5 % 1 patch  1 patch Transdermal QHS John Giovanni, MD   1 patch at 03/14/20 2335  . LORazepam (ATIVAN) tablet 0.5 mg  0.5 mg Oral BID Maryagnes Amos, FNP   0.5 mg at 03/15/20 0800  . risperiDONE (RISPERDAL)  tablet 2 mg  2 mg Oral BID Edsel Petrin, DO   2 mg at 03/15/20 0800  . tamsulosin (FLOMAX) capsule 0.4 mg  0.4 mg Oral Daily Edsel Petrin, DO   0.4 mg at 03/15/20 0800  . thiamine tablet 100 mg  100 mg Oral Daily Edsel Petrin, DO   100 mg at 03/15/20 0800  . traZODone (DESYREL) tablet 100 mg  100 mg Oral QHS PRN Edsel Petrin, DO        Musculoskeletal: Strength &  Muscle Tone: within normal limits Gait & Station: normal Patient leans: N/A  Psychiatric Specialty Exam: Physical Exam Vitals and nursing note reviewed.  Constitutional:      Appearance: He is well-developed.  HENT:     Head: Normocephalic and atraumatic.  Pulmonary:     Effort: Pulmonary effort is normal.  Musculoskeletal:        General: Normal range of motion.     Cervical back: Normal range of motion.  Neurological:     Mental Status: He is alert and oriented to person, place, and time.  Psychiatric:        Mood and Affect: Mood is depressed.        Speech: Speech normal.        Behavior: Behavior normal.        Thought Content: Thought content includes suicidal ideation. Thought content includes suicidal plan.        Cognition and Memory: Memory is impaired.        Judgment: Judgment normal.     Review of Systems  Psychiatric/Behavioral: Positive for depression and suicidal ideas.  All other systems reviewed and are negative.   Blood pressure 108/60, pulse 85, temperature 99 F (37.2 C), temperature source Oral, resp. rate 17, height 5\' 4"  (1.626 m), weight 54.2 kg, SpO2 95 %.Body mass index is 20.51 kg/m.  General Appearance: Disheveled  Eye Contact:  Minimal  Speech:  Normal Rate  Volume:  Decreased  Mood:  Depressed  Affect:  Congruent  Thought Process:  Coherent and Descriptions of Associations: Intact  Orientation:  Full (Time, Place, and Person)  Thought Content:  Rumination  Suicidal Thoughts:  No  Homicidal Thoughts:  No  Memory:  Immediate;   Fair Recent;   Fair Remote;    Fair  Judgement:  Impaired  Insight:  Fair  Psychomotor Activity:  Decreased  Concentration:  Concentration: Fair and Attention Span: Fair  Recall:  of Knowledge:  Fair  Language:  Fair  Akathisia:  No  Handed:  Right  AIMS (if indicated):   N/A  Assets:  Leisure Time Physical Health Resilience Social Support  ADL's:  Impaired  Cognition:  Impaired,  Mild  Sleep:   N/A     Treatment Plan Summary: Plan Recommend inpatient for crisis stabilization and medication management.   -Crisis stabilization -Medication management:  Continue Lexapro 10 mg daily for depression and Risperdal 02 mg BID for psychosis -Individual counseling -Recommend working with SW for inpatient admission to pyschiatirc hospital.  Disposition: Recommend psychiatric Inpatient admission when medically cleared.  [employer], FNP 03/15/2020 6:46 PM

## 2020-03-16 LAB — COMPREHENSIVE METABOLIC PANEL
ALT: 79 U/L — ABNORMAL HIGH (ref 0–44)
AST: 51 U/L — ABNORMAL HIGH (ref 15–41)
Albumin: 2.7 g/dL — ABNORMAL LOW (ref 3.5–5.0)
Alkaline Phosphatase: 59 U/L (ref 38–126)
Anion gap: 8 (ref 5–15)
BUN: 12 mg/dL (ref 6–20)
CO2: 27 mmol/L (ref 22–32)
Calcium: 9 mg/dL (ref 8.9–10.3)
Chloride: 100 mmol/L (ref 98–111)
Creatinine, Ser: 0.72 mg/dL (ref 0.61–1.24)
GFR, Estimated: >60 mL/min (ref >60)
Glucose, Bld: 286 mg/dL — ABNORMAL HIGH (ref 70–99)
Potassium: 3.8 mmol/L (ref 3.5–5.1)
Sodium: 135 mmol/L (ref 135–145)
Total Bilirubin: 0.6 mg/dL (ref 0.3–1.2)
Total Protein: 6.1 g/dL — ABNORMAL LOW (ref 6.5–8.1)

## 2020-03-16 LAB — HEMOGLOBIN AND HEMATOCRIT, BLOOD
HCT: 24.5 % — ABNORMAL LOW (ref 39.0–52.0)
Hemoglobin: 8.6 g/dL — ABNORMAL LOW (ref 13.0–17.0)

## 2020-03-16 LAB — GLUCOSE, CAPILLARY
Glucose-Capillary: 308 mg/dL — ABNORMAL HIGH (ref 70–99)
Glucose-Capillary: 249 mg/dL — ABNORMAL HIGH (ref 70–99)
Glucose-Capillary: 220 mg/dL — ABNORMAL HIGH (ref 70–99)
Glucose-Capillary: 299 mg/dL — ABNORMAL HIGH (ref 70–99)

## 2020-03-16 LAB — VITAMIN B1: Vitamin B1 (Thiamine): 101.2 nmol/L (ref 66.5–200.0)

## 2020-03-16 MED ORDER — INSULIN DETEMIR 100 UNIT/ML ~~LOC~~ SOLN
14.0000 [IU] | Freq: Two times a day (BID) | SUBCUTANEOUS | Status: DC
  Administered 2020-03-16 – 2020-03-17 (×3): 14 [IU] via SUBCUTANEOUS
  Filled 2020-03-16 (×5): qty 0.14

## 2020-03-16 MED ORDER — INSULIN STARTER KIT- PEN NEEDLES (ENGLISH)
1.0000 | Freq: Once | Status: AC
  Administered 2020-03-16: 16:00:00 1
  Filled 2020-03-16: qty 1

## 2020-03-16 NOTE — Progress Notes (Signed)
Inpatient Diabetes Program Recommendations  AACE/ADA: New Consensus Statement on Inpatient Glycemic Control (2015)  Target Ranges:  Prepandial:   less than 140 mg/dL      Peak postprandial:   less than 180 mg/dL (1-2 hours)      Critically ill patients:  140 - 180 mg/dL   Lab Results  Component Value Date   GLUCAP 308 (H) 03/16/2020   HGBA1C 13.1 (H) 03/09/2020    Review of Glycemic Control  Inpatient Diabetes Program Recommendations:    Noted patient to transfer to [facility] today. -Change Lantus to Levemir 14 units bid -Novolog 4 units tid meal coverage Discussed  Dr. Catha Gosselin. Will speak with patient with interpretor services [employer].  Spoke with patient with [employer] interpretor [phone]. Spoke with pt about new diagnosis. Noted however, patient has past hx diabetes. Discussed A1C results with them and explained what an A1C is, basic pathophysiology of DM Type 2, basic home care, basic diabetes diet nutrition principles, importance of checking CBGs and maintaining good CBG control to prevent long-term and short-term complications. Reviewed signs and symptoms of hyperglycemia and hypoglycemia and how to treat hypoglycemia at home. Also reviewed blood sugar goals at home.  RNs to provide ongoing basic DM education at bedside with this patient. Educated patient on insulin pen use at home. Reviewed all steps if insulin pen including attachment of needle, 2-unit air shot, dialing up dose, giving injection, removing needle, disposal of sharps, storage of unused insulin, disposal of insulin etc. Patient able to provide successful return demonstration except had difficulty with taking needle off. Patient was able to take the pen needle off successfully . Also reviewed troubleshooting with insulin pen.  RN to continue to review patient with insulin syringe and reviewed with RN Elisa.  Thank you, Billy Fischer. Romyn Boswell, RN, MSN, CDE  Diabetes Coordinator Inpatient Glycemic Control Team Team Pager  [phone] (8am-5pm) 03/16/2020 11:35 AM

## 2020-03-16 NOTE — Progress Notes (Signed)
PROGRESS NOTE    Garrett [name]  XBM:[id number] DOB: 12-20-1985 DOA: 03/09/2020 PCP: Patient, No Pcp Per   Brief Narrative:  HPI on 03/09/2020 by Dr. Shela Leff Jamis Kryder is a 34 y.o. 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  Per triage note, family reported that patient woke up altered this morning and has not been communicating as per usual.  Patient was not able to answer orientation questions appropriately at triage.  Interpreter services used to obtain history as patient speaks [name].  Patient appears confused and oriented to person and place only.  He is not sure why he is here and has no complaints.  Reports history of diabetes but does not know when he was diagnosed and states he is not on any medications.  Denies fevers, cough, shortness of breath, chest pain, nausea, vomiting, abdominal pain, or diarrhea.  No additional history could be obtained from him.  Interim history Patient admitted with new onset diabetes found to be in DKA.  Has been transitioned off of insulin drip and onto Lantus and NovoLog.  Patient also with acute metabolic encephalopathy. Not sure if his mental status changes may be due to MDD. Psych consulted and patient will need inpatient psych admission when medically stable.  CBGs remain uncontrolled.  Have changed him over to Levemir twice daily along with sliding scale and premeal coverage.  Assessment & Plan   DKA/ Uncontrolled-untreated diabetes mellitus, type II -Discussed with patient and cousin- he was diagnosed with diabetes over a year ago and was supposed to be on medications but unsure of which medications -Patient presented with altered mental status -Last hemoglobin A1c was 5.0 in March 2019 -On admission blood glucose was 343, bicarb of 20, anion gap of 21, ketones noted on urinalysis,  beta hydroxybutyrate acid > 8, VBG showed a pH of 7.36 -Hemoglobin A1c 13.1 -Patient was started on IV insulin per DKA protocol -Blood sugars remain uncontrolled, will switch over to Levemir 14 units twice daily -Continue insulin sliding scale with Premeal insulin -Diabetes coordinator following and appreciated  Acute metabolic encephalopathy -Resolved, AAOx4 -Possibly secondary to DKA vs psychosis -The gap is closed and patient has been transitioned to NovoLog and Lantus, he continues to be altered -CT head unremarkable for acute intercranial normality -Ammonia level within normal limits -UDS unremarkable, blood alcohol level undetectable -UA showed trace leukocytes, 11-20 WBCs, no bacteria -Urine culture showed no growth  -CXR unremarkable  -MRI brain: Rare foci of T2 hyperintensity within the white matter of cerebral hemispheres.  Mild para nasal sinus disease. -Discussed MRI findings with neurology, Dr. Leonel Ramsay, MRI is unremarkable for a CVA.  Findings are nonspecific, no acute intervention needed. -EEG: Study within normal limits.  No seizure or left form discharges seen throughout recording. -Vitamin B12, folate, TSH levels within normal limits -Vitamin B1 level pending -Continue thiamine supplementation -Continue to monitor closely -Improved, patient currently alert and oriented x4.  Appears to be at baseline. -Discussed with patient's cousin on 03/13/2020, it appears the patient has had similar episodes of being nonverbal and altered in the past approximately 3-4 times and has even had to be admitted to inpatient psych. -Psych reconsulted and appreciated and recommend inpatient psych placement  Elevated LFTs -AST 46, ALT 97, total bilirubin 1.7 on admission- trending downward -Hepatitis panel unremarkable -Abdominal ultrasound was normal -Continue to monitor CMP  Abnormal urinalysis -UA showed trace leukocytes, 11-20 WBCs, no bacteria -Currently patient afebrile with  no leukocytosis -Urine culture showed no growth -Overnight 12/8, patient was noted to have purulent drainage from his urethra.  Have ordered G&C, HIV.  -HIV unremarkable  -G&C negative  Acute Urinary retention -Continued to have urinary retention, patient had 3 in/outs -Foley catheter was placed, however voiding trial was successful on 12/12, catheter removed.  -Continue Flomax  Distended bladder on CT -CT abdomen pelvis showed severely distended urinary bladder with mild bilateral collecting system dilatation. -Patient did void spontaneously in the ED -Continue to monitor and do bladder scans every 4 hours  MDD with psychotic features -Continue Lexapro, Risperdal, trazodone -Question if this is also adding to patient's acute metabolic encephalopathy as he appears to have ran out of his medications -Patient remains alert, however, unresponsive at this time and just stares  -Psychiatry consulted and appreciated-recommended continuing home medications.  Patient may benefit from different anti-psychotic med given that risperidone can cause hyperglycemia/diabetes. Ativan 0.3m PO BID started. Inpatient psychiatric admission recommended once patient is medically cleared. Reconsult once patient is able to participate in evaluation.  -As above, discussed with patient's cousin.  This has occurred in the past and patient has required inpatient psych admission -Psychiatry reconsulted, recommended inpt psych.  [facility] to [facility]  Hypokalemia -resolved with replacement -Continue to monitor   Possible dysphagia -RN reports patient with coughing after swallowing -Speech therapy consulted for swallow eval -Currently on carb modified diet and tolerating well  Normocytic anemia -Hemoglobin on admission was 14 however down to 8.6 today -Possibly dilutional given IV fluids -Anemia panel obtained, patient does have adequate iron as well as  storage -FOBT pending -Discontinued IVF -Continue to monitor CBC  Back pain -Improved -Patient states he is now having left sided lower back pain -Continue lidocaine patch, ibuprofen, voltaren gel, oxycodone  DVT Prophylaxis  Lovenox  Code Status: Full  Family Communication: None at bedside.    Disposition Plan:  Status is: Inpatient  Remains inpatient appropriate because:Altered mental status-will need inpatient psychiatric admission   Dispo: The patient is from: Home              Anticipated d/c is to: Inpatient psych              Anticipated d/c date is: 1 days              Patient currently is not medically stable to d/c.   Consultants None  Procedures  Abd [place] Antibiotics   Anti-infectives (From admission, onward)   None      Subjective:   Steffan Giammona seen and examined today.  Used tOptometrist  Feels back pain has improved.  Denies current chest pain or shortness of breath, abdominal pain, nausea vomiting, diarrhea constipation, dizziness or headache.  Understands that he has to go to a psychiatric hospital for his depression.    Objective:   Vitals:   03/15/20 1300 03/15/20 2021 03/16/20 0438 03/16/20 0745  BP: 108/60 90/63 (!) 88/57 95/63  Pulse: 85 94 99 89  Resp: 17 19 15 18   Temp: 99 F (37.2 C) 98.1 F (36.7 C) 97.9 F (36.6 C) 98 F (36.7 C)  TempSrc: Oral Oral Axillary Oral  SpO2: 95% 95% 100% 97%  Weight:   50.6 kg   Height:        Intake/Output Summary (Last 24 hours) at 03/16/2020 1320 Last data filed at  03/16/2020 1024 Gross per 24 hour  Intake 840 ml  Output 3100 ml  Net -2260 ml   Filed Weights   03/11/20 1050 03/16/20 0438  Weight: 54.2 kg 50.6 kg   Exam  General: Well developed, chronically ill-appearing, NAD  HEENT: NCAT, mucous membranes moist.  Poor dentition  Cardiovascular: S1 S2 auscultated, RRR  Respiratory: Clear to auscultation bilaterally, no wheezing  Abdomen: Soft, nontender, nondistended, + bowel  sounds  Extremities: warm dry without cyanosis clubbing or edema  Neuro: AAOx3, nonfocal  Psych: flat  Data Reviewed: I have personally reviewed following labs and imaging studies  CBC: Recent Labs  Lab 03/09/20 1411 03/09/20 1702 03/11/20 0107 03/14/20 0422 03/15/20 0310 03/16/20 0028  WBC 7.6  --  6.2 6.9  --   --   HGB 14.7 14.3 12.3* 9.4* 8.7* 8.6*  HCT 42.8 42.0 35.7* 27.1* 24.9* 24.5*  MCV 94.5  --  95.2 96.4  --   --   PLT 270  --  164 124*  --   --    Basic Metabolic Panel: Recent Labs  Lab 03/12/20 1032 03/12/20 1033 03/13/20 0746 03/14/20 0422 03/15/20 0310 03/16/20 0028  NA  --  143 141 139 136 135  K  --  3.3* 3.4* 3.5 4.0 3.8  CL  --  106 106 105 102 100  CO2  --  28 26 25 27 27   GLUCOSE  --  163* 172* 139* 257* 286*  BUN  --  10 9 9 14 12   CREATININE  --  0.55* 0.47* 0.56* 0.65 0.72  CALCIUM  --  9.0 8.8* 8.5* 8.6* 9.0  MG 1.8  --   --   --   --   --    GFR: Estimated Creatinine Clearance: 91.4 mL/min (by C-G formula based on SCr of 0.72 mg/dL). Liver Function Tests: Recent Labs  Lab 03/11/20 0107 03/12/20 1033 03/14/20 0422 03/15/20 0310 03/16/20 0028  AST 70* 51* 43* 68* 51*  ALT 90* 91* 65* 80* 79*  ALKPHOS 64 61 48 58 59  BILITOT 1.8* 1.4* 1.3* 1.0 0.6  PROT 7.0 6.7 5.6* 6.0* 6.1*  ALBUMIN 3.3* 3.1* 2.6* 2.6* 2.7*   No results for input(s): LIPASE, AMYLASE in the last 168 hours. Recent Labs  Lab 03/09/20 1644  AMMONIA 18   Coagulation Profile: No results for input(s): INR, PROTIME in the last 168 hours. Cardiac Enzymes: No results for input(s): CKTOTAL, CKMB, CKMBINDEX, TROPONINI in the last 168 hours. BNP (last 3 results) No results for input(s): PROBNP in the last 8760 hours. HbA1C: No results for input(s): HGBA1C in the last 72 hours. CBG: Recent Labs  Lab 03/15/20 1229 03/15/20 1700 03/15/20 2020 03/16/20 0742 03/16/20 1137  GLUCAP 261* 157* 226* 308* 249*   Lipid Profile: No results for input(s): CHOL, HDL,  LDLCALC, TRIG, CHOLHDL, LDLDIRECT in the last 72 hours. Thyroid Function Tests: No results for input(s): TSH, T4TOTAL, FREET4, T3FREE, THYROIDAB in the last 72 hours. Anemia Panel: Recent Labs    03/14/20 0718  VITAMINB12 750  FOLATE 16.5  FERRITIN 445*  TIBC 256  IRON 163  RETICCTPCT 3.5*   Urine analysis:    Component Value Date/Time   COLORURINE YELLOW 03/09/2020 1945   APPEARANCEUR CLEAR 03/09/2020 1945   LABSPEC 1.036 (H) 03/09/2020 1945   PHURINE 5.0 03/09/2020 1945   GLUCOSEU >=500 (A) 03/09/2020 1945   HGBUR NEGATIVE 03/09/2020 [place] NEGATIVE 03/09/2020 1945   KETONESUR 80 (A) 03/09/2020 1945  PROTEINUR 30 (A) 03/09/2020 1945   NITRITE NEGATIVE 03/09/2020 1945   LEUKOCYTESUR TRACE (A) 03/09/2020 1945   Sepsis Labs: @LABRCNTIP (procalcitonin:4,lacticidven:4)  ) Recent Results (from the past 240 hour(s))  Resp Panel by RT-PCR (Flu A&B, Covid) Nasopharyngeal Swab     Status: None   Collection Time: 03/09/20  4:44 PM   Specimen: Nasopharyngeal Swab; Nasopharyngeal(NP) swabs in vial transport medium  Result Value Ref Range Status   SARS Coronavirus 2 by RT PCR NEGATIVE NEGATIVE Final    Comment: (NOTE) SARS-CoV-2 target nucleic acids are NOT DETECTED.  The SARS-CoV-2 RNA is generally detectable in upper respiratory specimens during the acute phase of infection. The lowest concentration of SARS-CoV-2 viral copies this assay can detect is 138 copies/mL. A negative result does not preclude SARS-Cov-2 infection and should not be used as the sole basis for treatment or other patient management decisions. A negative result may occur with  improper specimen collection/handling, submission of specimen other than nasopharyngeal swab, presence of viral mutation(s) within the areas targeted by this assay, and inadequate number of viral copies(<138 copies/mL). A negative result must be combined with clinical observations, patient history, and  epidemiological information. The expected result is Negative.  Fact Sheet for Patients:  EntrepreneurPulse.com.au  Fact Sheet for Healthcare Providers:  IncredibleEmployment.be  This test is no t yet approved or cleared by the [place] FDA and  has been authorized for detection and/or diagnosis of SARS-CoV-2 by FDA under an Emergency Use Authorization (EUA). This EUA will remain  in effect (meaning this test can be used) for the duration of the COVID-19 declaration under Section 564(b)(1) of the Act, 21 U.S.C.section 360bbb-3(b)(1), unless the authorization is terminated  or revoked sooner.       Influenza A by PCR NEGATIVE NEGATIVE Final   Influenza B by PCR NEGATIVE NEGATIVE Final    Comment: (NOTE) The Xpert Xpress SARS-CoV-2/FLU/RSV plus assay is intended as an aid in the diagnosis of influenza from Nasopharyngeal swab specimens and should not be used as a sole basis for treatment. Nasal washings and aspirates are unacceptable for Xpert Xpress SARS-CoV-2/FLU/RSV testing.  Fact Sheet for Patients: EntrepreneurPulse.com.au  Fact Sheet for Healthcare Providers: IncredibleEmployment.be  This test is not yet approved or cleared by the [place] FDA and has been authorized for detection and/or diagnosis of SARS-CoV-2 by FDA under an Emergency Use Authorization (EUA). This EUA will remain in effect (meaning this test can be used) for the duration of the COVID-19 declaration under Section 564(b)(1) of the Act, 21 U.S.C. section 360bbb-3(b)(1), unless the authorization is terminated or revoked.  Performed at [facility], [place] [address], [place], [place] [address]   Culture, blood (routine x 2)     Status: None   Collection Time: 03/09/20  4:44 PM   Specimen: BLOOD  Result Value Ref Range Status   Specimen Description BLOOD SITE NOT SPECIFIED  Final   Special Requests   Final    BOTTLES  DRAWN AEROBIC AND ANAEROBIC Blood Culture results may not be optimal due to an inadequate volume of blood received in culture bottles   Culture   Final    NO GROWTH 5 DAYS Performed at [facility], [place] [address] [place], [place] [address]    Report Status 03/14/2020 FINAL  Final  Culture, blood (routine x 2)     Status: None   Collection Time: 03/09/20  4:55 PM   Specimen: BLOOD  Result Value Ref Range Status   Specimen  Description BLOOD SITE NOT SPECIFIED  Final   Special Requests   Final    BOTTLES DRAWN AEROBIC AND ANAEROBIC Blood Culture results may not be optimal due to an inadequate volume of blood received in culture bottles   Culture   Final    NO GROWTH 5 DAYS Performed at [facility], [place] [address] [place], [place] [address]    Report Status 03/14/2020 FINAL  Final  Culture, Urine     Status: None   Collection Time: 03/11/20  3:20 AM   Specimen: Urine, Random  Result Value Ref Range Status   Specimen Description URINE, RANDOM  Final   Special Requests NONE  Final   Culture   Final    NO GROWTH Performed at [facility], [place] [address] [place],  [address]    Report Status 03/12/2020 FINAL  Final      Radiology Studies: No results found.   Scheduled Meds:  enoxaparin (LOVENOX) injection  40 mg Subcutaneous Q24H   escitalopram  10 mg Oral Daily   feeding supplement  237 mL Oral BID BM   insulin aspart  0-15 Units Subcutaneous TID WC   insulin aspart  2 Units Subcutaneous TID WC   insulin detemir  14 Units Subcutaneous BID   insulin starter kit- pen needles  1 kit Other Once   lidocaine  1 patch Transdermal QHS   LORazepam  0.5 mg Oral BID   risperiDONE  2 mg Oral BID   tamsulosin  0.4 mg Oral Daily   thiamine  100 mg Oral Daily   Continuous Infusions:  sodium chloride       LOS: 7 days   Time Spent in minutes   30 minutes  Daily Crate D.O. on 03/16/2020 at 1:20 PM  Between 7am to 7pm - Please see pager  noted on [email or web address]  After 7pm go to [email or web address]  And look for the night coverage person covering for me after hours  [facility] Office  [phone]

## 2020-03-16 NOTE — Progress Notes (Addendum)
9am-CSW received consult regarding inpatient psych placement. Per MD, patient does not require IVC. CSW sent referral to [facility] for review. Unable to reach [facility] (tried three different numbers). Faxed referral to [facility], [facility], and [facility].   10am-[facility] able to accept patient today. Will notify patient. [facility] reported being very busy so will have to accept the first available bed.  Accepting MD: Estill Cotta Report: [phone]  1pm-CSW confirmed [facility] can do a sliding scale insulin but they requested patient's blood sugar be below 200 as he will have to wait for a period of time upon arrival before he is admitted into their unit. Per MD, will work on blood sugar today for discharge tomorrow. CSW will continue to try to reach [facility] to make them aware (waited on hold 30 mins).   2pm-Finally able to reach admissions; they reported they will hold the patient's bed until tomorrow and RN can call report then.   Lashona Schaaf LCSW

## 2020-03-17 DIAGNOSIS — E0810 Diabetes mellitus due to underlying condition with ketoacidosis without coma: Secondary | ICD-10-CM

## 2020-03-17 DIAGNOSIS — R400 Somnolence: Secondary | ICD-10-CM

## 2020-03-17 LAB — COMPREHENSIVE METABOLIC PANEL
ALT: 75 U/L — ABNORMAL HIGH (ref 0–44)
AST: 46 U/L — ABNORMAL HIGH (ref 15–41)
Albumin: 2.8 g/dL — ABNORMAL LOW (ref 3.5–5.0)
Alkaline Phosphatase: 63 U/L (ref 38–126)
Anion gap: 9 (ref 5–15)
BUN: 15 mg/dL (ref 6–20)
CO2: 28 mmol/L (ref 22–32)
Calcium: 9 mg/dL (ref 8.9–10.3)
Chloride: 98 mmol/L (ref 98–111)
Creatinine, Ser: 0.71 mg/dL (ref 0.61–1.24)
GFR, Estimated: >60 mL/min (ref >60)
Glucose, Bld: 282 mg/dL — ABNORMAL HIGH (ref 70–99)
Potassium: 3.9 mmol/L (ref 3.5–5.1)
Sodium: 135 mmol/L (ref 135–145)
Total Bilirubin: 0.6 mg/dL (ref 0.3–1.2)
Total Protein: 6.4 g/dL — ABNORMAL LOW (ref 6.5–8.1)

## 2020-03-17 LAB — GLUCOSE, CAPILLARY
Glucose-Capillary: 249 mg/dL — ABNORMAL HIGH (ref 70–99)
Glucose-Capillary: 261 mg/dL — ABNORMAL HIGH (ref 70–99)

## 2020-03-17 LAB — HEMOGLOBIN AND HEMATOCRIT, BLOOD
HCT: 25.2 % — ABNORMAL LOW (ref 39.0–52.0)
Hemoglobin: 8.8 g/dL — ABNORMAL LOW (ref 13.0–17.0)

## 2020-03-17 MED ORDER — INSULIN DETEMIR 100 UNIT/ML ~~LOC~~ SOLN
3.0000 [IU] | Freq: Once | SUBCUTANEOUS | Status: AC
  Administered 2020-03-17: 13:00:00 3 [IU] via SUBCUTANEOUS
  Filled 2020-03-17: qty 0.03

## 2020-03-17 MED ORDER — ENSURE ENLIVE PO LIQD: 237.0000 mL | Freq: Two times a day (BID) | ORAL | 12 refills | Status: DC

## 2020-03-17 MED ORDER — THIAMINE HCL 100 MG PO TABS: 100.0000 mg | ORAL_TABLET | Freq: Every day | ORAL | Status: DC

## 2020-03-17 MED ORDER — INSULIN ASPART 100 UNIT/ML ~~LOC~~ SOLN
4.0000 [IU] | Freq: Three times a day (TID) | SUBCUTANEOUS | Status: DC
  Administered 2020-03-17: 13:00:00 4 [IU] via SUBCUTANEOUS

## 2020-03-17 MED ORDER — INSULIN ASPART 100 UNIT/ML ~~LOC~~ SOLN: 0.0000 [IU] | Freq: Three times a day (TID) | SUBCUTANEOUS | 11 refills | Status: DC

## 2020-03-17 MED ORDER — INSULIN ASPART 100 UNIT/ML ~~LOC~~ SOLN: 3.0000 [IU] | Freq: Three times a day (TID) | SUBCUTANEOUS | 11 refills | Status: DC

## 2020-03-17 MED ORDER — INSULIN DETEMIR 100 UNIT/ML ~~LOC~~ SOLN: 17.0000 [IU] | Freq: Two times a day (BID) | SUBCUTANEOUS | 11 refills | Status: DC

## 2020-03-17 MED ORDER — IBUPROFEN 400 MG PO TABS: 400.0000 mg | ORAL_TABLET | Freq: Three times a day (TID) | ORAL | 0 refills | Status: DC | PRN

## 2020-03-17 MED ORDER — TAMSULOSIN HCL 0.4 MG PO CAPS: 0.4000 mg | ORAL_CAPSULE | Freq: Every day | ORAL | 3 refills | Status: DC

## 2020-03-17 MED ORDER — INSULIN DETEMIR 100 UNIT/ML ~~LOC~~ SOLN
17.0000 [IU] | Freq: Two times a day (BID) | SUBCUTANEOUS | Status: DC
  Filled 2020-03-17: qty 0.17

## 2020-03-17 NOTE — Progress Notes (Signed)
   03/17/20 0501  Assess: MEWS Score  Temp 98.5 F (36.9 C)  BP 100/61  Pulse Rate (!) 103  ECG Heart Rate (!) 102  Resp 14  SpO2 100 %  Assess: MEWS Score  MEWS Temp 0  MEWS Systolic 1  MEWS Pulse 1  MEWS RR 0  MEWS LOC 0  MEWS Score 2  MEWS Score Color Yellow  Assess: if the MEWS score is Yellow or Red  Were vital signs taken at a resting state? Yes  Focused Assessment No change from prior assessment  Early Detection of Sepsis Score *See Row Information* Low  MEWS guidelines implemented *See Row Information* No, previously yellow, continue vital signs every 4 hours  Treat  MEWS Interventions Other (Comment) (HR goes up to the 100's)  Pain Scale 0-10  Pain Score 0  Take Vital Signs  Increase Vital Sign Frequency   (Q4)  Escalate  MEWS: Escalate Yellow: discuss with charge nurse/RN and consider discussing with provider and RRT  Notify: Charge Nurse/RN  Name of Charge Nurse/RN Notified Anna,RN  Date Charge Nurse/RN Notified 03/17/20  Time Charge Nurse/RN Notified [id number]  Notify: Provider  Provider Name/Title NA  Notify: Rapid Response  Name of Rapid Response RN Notified N/A

## 2020-03-17 NOTE — TOC Transition Note (Signed)
Transition of Care [facility]) - CM/SW Discharge Note   Patient Details  Name: Garrett [name] MRN: [id number] Date of Birth: 03-Jul-1985  [facility] CM/SW Contact:  Mearl Latin, LCSW Phone Number: 03/17/2020, 2:42 PM   Clinical Narrative:    Patient will DC to: [facility] Anticipated DC date: 03/17/20 Family notified: Pt declined stating no family Transport by: [facility] after 4pm (they will call the nursing station upon arrival at the [facility])  RN attempted to call for report but has been unsuccessful. CSW also attempted to get someone on the phone with no success. CSW faxed facility Rn's phone number if they would like to call for report.  Accepting MD: Estill Cotta   Per MD patient ready for voluntary DC to [facility]. RN to call report prior to discharge [phone]). RN, patient, and facility notified of DC. Discharge Summary faxed to facility. Transport requested.  CSW will sign off for now as social work intervention is no longer needed. Please consult [place] again if new needs arise.      Final next level of care: [facility] Barriers to Discharge: No Barriers Identified   Patient Goals and CMS Choice        Discharge Placement                Patient to be transferred to facility by: [facility] transportation Name of family member notified: Declined Patient and family notified of of transfer: 03/17/20  Discharge Plan and Services                                     Social Determinants of Health (SDOH) Interventions     Readmission Risk Interventions No flowsheet data found.

## 2020-03-17 NOTE — Discharge Summary (Signed)
Physician Discharge Summary   Patient ID: Garrett [name] MRN: [id number] DOB/AGE: [birth year] 34 y.o.  Admit date: 03/09/2020 Discharge date: 03/17/2020  Primary Care Physician:  Patient, No Pcp Per   Recommendations for Outpatient Follow-up:  1. Follow up with PCP in 1-2 weeks 2. Continue Levemir 17 units twice a day, NovoLog meal coverage 3 units 3 times daily AC and sliding scale insulin if needed.  Please titrate insulin regimen according to CBG readings and fasting blood sugars  Home Health: Equipment/Devices:   Discharge Condition: Stable CODE STATUS: FULL  Diet recommendation: Carb modified diet   Discharge Diagnoses:    . DKA (diabetic ketoacidosis) (HCC) Untreated, new onset diabetes mellitus type 2 Acute metabolic encephalopathy, resolved . Major depressive disorder, recurrent severe with psychotic features (HCC) Transaminitis Acute urinary retention Hypokalemia Normocytic anemia Back pain resolved    Consults: Psychiatry    Allergies:  No Known Allergies   DISCHARGE MEDICATIONS: Allergies as of 03/17/2020   No Known Allergies     Medication List    STOP taking these medications   hydrOXYzine 25 MG tablet Commonly known as: ATARAX/VISTARIL     TAKE these medications   escitalopram 10 MG tablet Commonly known as: LEXAPRO Take 1 tablet (10 mg total) by mouth daily. For mood control What changed: additional instructions   feeding supplement Liqd Take 237 mLs by mouth 2 (two) times daily between meals.   ibuprofen 400 MG tablet Commonly known as: ADVIL Take 1 tablet (400 mg total) by mouth every 8 (eight) hours as needed for mild pain or moderate pain.   insulin aspart 100 UNIT/ML injection Commonly known as: novoLOG Inject 3 Units into the skin 3 (three) times daily with meals.   insulin aspart 100 UNIT/ML injection Commonly known as: novoLOG Inject 0-15 Units into the skin 3 (three) times daily with meals. SLIDING SCALE   CBG 70 - 120: 0  units: CBG 121 - 150: 2 units; CBG 151 - 200: 3 units; CBG 201 - 250: 5 units; CBG 251 - 300: 8 units;CBG 301 - 350: 11 units; CBG 351 - 400: 15 units; CBG > 400 : 15 units and notify MD   insulin detemir 100 UNIT/ML injection Commonly known as: LEVEMIR Inject 0.17 mLs (17 Units total) into the skin 2 (two) times daily.   LORazepam 0.5 MG tablet Commonly known as: ATIVAN Take 1 tablet (0.5 mg total) by mouth 2 (two) times daily. For anxiety   risperiDONE 2 MG tablet Commonly known as: RISPERDAL Take 1 tablet (2 mg total) by mouth 2 (two) times daily. For mood control   tamsulosin 0.4 MG Caps capsule Commonly known as: FLOMAX Take 1 capsule (0.4 mg total) by mouth daily. Start taking on: March 18, 2020   thiamine 100 MG tablet Take 1 tablet (100 mg total) by mouth daily.   traZODone 100 MG tablet Commonly known as: DESYREL Take 1 tablet (100 mg total) by mouth at bedtime as needed for sleep.        Brief H and P: For complete details please refer to admission H and P, but in brief HPI on 03/09/2020 by Dr. Darreld Mclean a 34 y.o.malewith medical history significant ofmajor depressive disorder recurrent and severe with psychotic features being brought to the ED by his son for evaluation of altered mental status and high blood pressure. Per triage note, family reported that patient woke up altered this morning and has not been communicating as per usual. Patient was not able  to answer orientation questions appropriately at triage. Interpreter services used to obtain history as patient speaks [name].Patient appears confused and oriented to person and place only. He is not sure why he is here and has no complaints. Reports history of diabetes but does not know when he was diagnosed and states he is not on any medications. Denies fevers, cough, shortness of breath, chest pain, nausea, vomiting, abdominal pain, or diarrhea. No additional history could be obtained  from him.  Interim history Patient was admitted with new onset diabetes found to be in DKA, now has been transitioned off of insulin drip and onto basal insulin with meal coverage.  Patient also with acute metabolic encephalopathy. Not sure if his mental status changes may be due to MDD. Psych consulted and patient will need inpatient psych admission.    Hospital Course:   DKA/ Uncontrolled-untreated diabetes mellitus, type II -Per patient and family, he was diagnosed with diabetes over a year ago and was supposed to be on medications but unsure of which medications.  Patient presented with altered mental status, last hemoglobin A1c was 5.0 in 06/2017.  Hemoglobin A1c this admission 13.1 -Initially placed on IV insulin drip per DKA protocol, then transitioned over to Levemir and NovoLog meal coverage with sliding scale -Blood sugars remained uncontrolled hence Levemir titrated up to 17 units twice a day, meal coverage 3 units 3 times daily AC, continue sliding scale insulin -Insulin regimen will need to be titrated with blood sugars  Acute metabolic encephalopathy -Resolved, AAOx4,  Possibly secondary to DKA vs psychosis -CT head unremarkable for acute intercranial normality.  Ammonia level within normal limits.  UDS unremarkable, blood alcohol level undetectable. -UA showed trace leukocytes, 11-20 WBCs, no bacteria, urine culture showed no growth.  Chest x-ray unremarkable. -MRI brain: Rare foci of T2 hyperintensity within the white matter of cerebral hemispheres.  Mild para nasal sinus disease. -Dr. Catha Gosselin discussed MRI findings with neurology, Dr. Amada Jupiter, MRI is unremarkable for a CVA.  Findings are nonspecific, no acute intervention needed. -EEG: Study within normal limits.  No seizure or left form discharges seen throughout recording. -Vitamin B12, folate, TSH levels within normal limits -Vitamin B 101.2, within normal limits -Continue thiamine supplementation, currently alert and  oriented x4, appears to be at baseline Dr Catha Gosselin discussed with patient's cousin on 03/13/2020, it appears the patient has had similar episodes of being nonverbal and altered in the past approximately 3-4 times and has even had to be admitted to inpatient psych. -Psych was consulted and recommend inpatient psych placement  Elevated LFTs -AST 46, ALT 97, total bilirubin 1.7 on admission- trending downward -Hepatitis panel unremarkable -Abdominal ultrasound was normal   Abnormal urinalysis -UA showed trace leukocytes, 11-20 WBCs, no bacteria, urine culture showed no growth -Overnight 12/8, patient was noted to have purulent drainage from his urethra.  Have ordered G&C, HIV. HIV nonreactive -G&C negative  Acute Urinary retention -During hospitalization patient continued to have urinary retention, patient had 3 in/outs -Foley catheter was placed, however voiding trial was successful on 12/12, catheter removed.  -CT abdomen pelvis showed severely distended urinary bladder with mild bilateral collecting system dilatation. Patient did void spontaneously in the ED -Continue Flomax  MDD with psychotic features -Continue Lexapro, Risperdal, trazodone -Question if this is also adding to patient's acute metabolic encephalopathy as he appears to have ran out of his medications -Patient remains alert with flat affect -Psychiatry consulted and appreciated-recommended continuing home medications.  Patient may benefit from different anti-psychotic med given that  risperidone can cause hyperglycemia/diabetes. Ativan 0.5mg  PO BID started. Inpatient psychiatric admission recommended once patient is medically cleared. Reconsult once patient is able to participate in evaluation.  --Psychiatry reconsulted, recommended inpt psych.  -patient accepted to [facility]   Hypokalemia -resolved with replacement -Continue to monitor   Possible dysphagia -RN reports patient with coughing after  swallowing -Speech therapy consulted for swallow eval -Currently on carb modified diet and tolerating well  Normocytic anemia -Hemoglobin on admission was 14, likely hemoconcentrated.  Patient received IV fluid hydration.  Hemoglobin at the time of discharge 8.8. -Anemia panel obtained, patient does have adequate iron as well as storage No history of bleeding, hematochezia or melena.   Back pain -Improved  Day of Discharge S: No acute complaints, alert awake, flat affect   BP 95/67 (BP Location: Right Arm)   Pulse 96   Temp 97.6 F (36.4 C) (Oral)   Resp 20   Ht 5\' 4"  (1.626 m)   Wt 50.6 kg   SpO2 100%   BMI 19.15 kg/m   Physical Exam: General: Alert and awake, not in any acute distress. HEENT: anicteric sclera, pupils reactive to light and accommodation CVS: S1-S2 clear no murmur rubs or gallops Chest: clear to auscultation bilaterally, no wheezing rales or rhonchi Abdomen: soft nontender, nondistended, normal bowel sounds Extremities: no cyanosis, clubbing or edema noted bilaterally    Get Medicines reviewed and adjusted: Please take all your medications with you for your next visit with your Primary MD  Please request your Primary MD to go over all hospital tests and procedure/radiological results at the follow up. Please ask your Primary MD to get all Hospital records sent to his/her office.  If you experience worsening of your admission symptoms, develop shortness of breath, life threatening emergency, suicidal or homicidal thoughts you must seek medical attention immediately by calling 911 or calling your MD immediately  if symptoms less severe.  You must read complete instructions/literature along with all the possible adverse reactions/side effects for all the Medicines you take and that have been prescribed to you. Take any new Medicines after you have completely understood and accept all the possible adverse reactions/side effects.   Do not drive when taking  pain medications.   Do not take more than prescribed Pain, Sleep and Anxiety Medications  Special Instructions: If you have smoked or chewed Tobacco  in the last 2 yrs please stop smoking, stop any regular Alcohol  and or any Recreational drug use.  Wear Seat belts while driving.  Please note  You were cared for by a hospitalist during your hospital stay. Once you are discharged, your primary care physician will handle any further medical issues. Please note that NO REFILLS for any discharge medications will be authorized once you are discharged, as it is imperative that you return to your primary care physician (or establish a relationship with a primary care physician if you do not have one) for your aftercare needs so that they can reassess your need for medications and monitor your lab values.   The results of significant diagnostics from this hospitalization (including imaging, microbiology, ancillary and laboratory) are listed below for reference.      Procedures/Studies:  DG Chest 2 View  Result Date: 03/09/2020 CLINICAL DATA:  Chest pain.  Altered mental status. EXAM: CHEST - 2 VIEW COMPARISON:  None. FINDINGS: The heart size and mediastinal contours are within normal limits. Both lungs are clear. The visualized skeletal structures are unremarkable. IMPRESSION: Negative two view  chest x-ray Electronically Signed   By: Marin Roberts M.D.   On: 03/09/2020 17:37   CT Head Wo Contrast  Result Date: 03/09/2020 CLINICAL DATA:  Mental status change, unknown cause. Patient not communicating as he normally does. EXAM: CT HEAD WITHOUT CONTRAST TECHNIQUE: Contiguous axial images were obtained from the base of the skull through the vertex without intravenous contrast. COMPARISON:  None. FINDINGS: Brain: No acute infarct, hemorrhage, or mass lesion is present. The ventricles are of normal size. No significant extraaxial fluid collection is present. The brainstem and cerebellum are within  normal limits. Vascular: No hyperdense vessel or unexpected calcification. Skull: Calvarium is intact. No focal lytic or blastic lesions are present. No significant extracranial soft tissue lesion is present. Sinuses/Orbits: Posterior right ethmoid air cells are opacified. There is scattered mucosal thickening otherwise. A fluid level is present the right maxillary sinus. There is some mucosal thickening in both maxillary sinuses. Mastoid air cells are clear. The globes and orbits are within normal limits. IMPRESSION: 1. Normal CT appearance of the brain. No acute or focal abnormality to explain mental status changes. 2. Right maxillary sinus disease. Electronically Signed   By: Marin Roberts M.D.   On: 03/09/2020 18:30   MR BRAIN WO CONTRAST  Result Date: 03/12/2020 CLINICAL DATA:  Mental status changes of unknown cause EXAM: MRI HEAD WITHOUT CONTRAST TECHNIQUE: Multiplanar, multiecho pulse sequences of the brain and surrounding structures were obtained without intravenous contrast. COMPARISON:  Head CT March 09, 2020. FINDINGS: Brain: No acute infarction, hemorrhage, hydrocephalus, extra-axial collection or mass lesion. Rare foci of T2 hyperintensity are seen within the white matter of the cerebral hemispheres, nonspecific. Vascular: Normal flow voids. Skull and upper cervical spine: Normal marrow signal. Sinuses/Orbits: Mild mucosal thickening throughout the paranasal sinuses. The orbits are maintained. Other: None. IMPRESSION: 1. Rare foci of T2 hyperintensity within the white matter of the cerebral hemispheres are nonspecific but may be seen in the setting of chronic small vessel ischemic disease, migraine headaches and post inflammatory/infectious process. 2. Mild paranasal sinus disease. Electronically Signed   By: Baldemar Lenis M.D.   On: 03/12/2020 14:38   CT Abdomen Pelvis W Contrast  Result Date: 03/09/2020 CLINICAL DATA:  Right lower quadrant abdominal pain. EXAM: CT  ABDOMEN AND PELVIS WITH CONTRAST TECHNIQUE: Multidetector CT imaging of the abdomen and pelvis was performed using the standard protocol following bolus administration of intravenous contrast. CONTRAST:  OMNIPAQUE IOHEXOL 300 MG/ML  SOLN COMPARISON:  None. FINDINGS: Lower chest: The lung bases are clear. The heart size is normal. Hepatobiliary: The liver is normal. Normal gallbladder.There is no biliary ductal dilation. Pancreas: Normal contours without ductal dilatation. No peripancreatic fluid collection. Spleen: Unremarkable. Adrenals/Urinary Tract: --Adrenal glands: Unremarkable. --Right kidney/ureter: There is mild collecting system dilatation to the level of the urinary bladder. --Left kidney/ureter: There is mild collecting system dilatation to the level of the urinary bladder. --Urinary bladder: The urinary bladder is severely distended. Stomach/Bowel: --Stomach/Duodenum: No hiatal hernia or other gastric abnormality. Normal duodenal course and caliber. --Small bowel: Unremarkable. --Colon: Large of the colon --Appendix: Normal. Vascular/Lymphatic: Atherosclerotic calcification is present within the non-aneurysmal abdominal aorta, without hemodynamically significant stenosis. --No retroperitoneal lymphadenopathy. --No mesenteric lymphadenopathy. --No pelvic or inguinal lymphadenopathy. Reproductive: Unremarkable Other: No ascites or free air. The abdominal wall is normal. Musculoskeletal. No acute displaced fractures. IMPRESSION: 1. Normal appendix in the right lower quadrant. 2. Severely distended urinary bladder with mild bilateral collecting system dilatation to the level of the urinary  bladder. 3. Atherosclerotic changes of the abdominal aorta, much greater than expected for a patient of this age. Aortic Atherosclerosis (ICD10-I70.0). Electronically Signed   By: Katherine Mantlehristopher  Green M.D.   On: 03/09/2020 19:09   EEG adult  Result Date: 03/12/2020 Charlsie QuestYadav, Priyanka O, MD     03/12/2020 12:57 PM  Patient Name: Shirl HarrisHtee Alred MRN: [id number] Epilepsy Attending: Charlsie QuestPriyanka O Yadav Referring Physician/Provider: Dr Edsel PetrinMaryann Mikhail Date: 03/12/2020 Duration: 28.15 mins Patient history: 34 year old male with altered mental status.  EEG to evaluate for seizures. Level of alertness: Awake, asleep AEDs during EEG study: None Technical aspects: This EEG study was done with scalp electrodes positioned according to the 10-20 International system of electrode placement. Electrical activity was acquired at a sampling rate of 500Hz  and reviewed with a high frequency filter of 70Hz  and a low frequency filter of 1Hz . EEG data were recorded continuously and digitally stored. Description: The posterior dominant rhythm consists of 9-10 Hz activity of moderate voltage (25-35 uV) seen predominantly in posterior head regions, symmetric and reactive to eye opening and eye closing. Sleep was characterized by vertex waves, sleep spindles (12 to 14 Hz), maximal frontocentral region.   Physiologic photic driving was not seen during photic stimulation.  Hyperventilation was not performed.   IMPRESSION: This study is within normal limits. No seizures or epileptiform discharges were seen throughout the recording. Priyanka Annabelle Harman Yadav   US Abdomen Limited RUQ (LIVER/GB)  Result Date: 03/09/2020 CLINICAL DATA:  Elevated LFTs EXAM: ULTRASOUND ABDOMEN LIMITED RIGHT UPPER QUADRANT COMPARISON:  CT earlier in the same day. FINDINGS: Gallbladder: No gallstones or wall thickening visualized. No sonographic Murphy sign noted by sonographer. Common bile duct: Diameter: 4 mm Liver: No focal lesion identified. Within normal limits in parenchymal echogenicity. Portal vein is patent on color Doppler imaging with normal direction of blood flow towards the liver. Other: None. IMPRESSION: Normal study. Electronically Signed   By: Katherine Mantlehristopher  Green M.D.   On: 03/09/2020 22:48      LAB RESULTS: Basic Metabolic Panel: Recent Labs  Lab 03/12/20 1032  03/12/20 1033 03/16/20 0028 03/17/20 0128  NA  --    < > 135 135  K  --    < > 3.8 3.9  CL  --    < > 100 98  CO2  --    < > 27 28  GLUCOSE  --    < > 286* 282*  BUN  --    < > 12 15  CREATININE  --    < > 0.72 0.71  CALCIUM  --    < > 9.0 9.0  MG 1.8  --   --   --    < > = values in this interval not displayed.   Liver Function Tests: Recent Labs  Lab 03/16/20 0028 03/17/20 0128  AST 51* 46*  ALT 79* 75*  ALKPHOS 59 63  BILITOT 0.6 0.6  PROT 6.1* 6.4*  ALBUMIN 2.7* 2.8*   No results for input(s): LIPASE, AMYLASE in the last 168 hours. No results for input(s): AMMONIA in the last 168 hours. CBC: Recent Labs  Lab 03/11/20 0107 03/14/20 0422 03/15/20 0310 03/16/20 0028 03/17/20 0128  WBC 6.2 6.9  --   --   --   HGB 12.3* 9.4*   < > 8.6* 8.8*  HCT 35.7* 27.1*   < > 24.5* 25.2*  MCV 95.2 96.4  --   --   --   PLT 164 124*  --   --   --    < > =  values in this interval not displayed.   Cardiac Enzymes: No results for input(s): CKTOTAL, CKMB, CKMBINDEX, TROPONINI in the last 168 hours. BNP: Invalid input(s): POCBNP CBG: Recent Labs  Lab 03/16/20 2033 03/17/20 0757  GLUCAP 299* 249*       Disposition and Follow-up: Discharge Instructions    Diet Carb Modified   Complete by: As directed    Increase activity slowly   Complete by: As directed        DISPOSITION inpatient psych   DISCHARGE FOLLOW-UP  Follow-up Information    [facility] Follow up.   Specialty: Urgent Care Why: Follow up for mental health care.  Contact information: [phone] [place] [address] [phone]               Time coordinating discharge:    Signed:   Thad Ranger M.D. [facility] 03/17/2020, 12:05 PM

## 2020-03-17 NOTE — Progress Notes (Signed)
CSW spoke with [facility] RN. She reported that they will be able to accept patient today with his blood sugars in the 200s as they appear to be stable.   CSW spoke with patient using phone interpretor service (Burmese which patient reported understanding). He reported understanding of discharge to [facility] facility today and when asked if he wanted CSW to contact any family or friends, he stated that he did not have anyone to contact.  CSW will arrange voluntary transport through [employer].   Nhung Danko LCSW

## 2020-03-17 NOTE — Progress Notes (Signed)
Inpatient Diabetes Program Recommendations  AACE/ADA: New Consensus Statement on Inpatient Glycemic Control (2015)  Target Ranges:  Prepandial:   less than 140 mg/dL      Peak postprandial:   less than 180 mg/dL (1-2 hours)      Critically ill patients:  140 - 180 mg/dL   Lab Results  Component Value Date   GLUCAP 249 (H) 03/17/2020   HGBA1C 13.1 (H) 03/09/2020    Review of Glycemic Control Results for Garrett [name], Garrett [name] (MRN [id number]) as of 03/17/2020 10:00  Ref. Range 03/16/2020 07:42 03/16/2020 11:37 03/16/2020 17:20 03/16/2020 20:33 03/17/2020 07:57  Glucose-Capillary Latest Ref Range: 70 - 99 mg/dL 626 (H) 948 (H) 546 (H) 299 (H) 249 (H)   Inpatient Diabetes Program Recommendations:   Please consider: -Increase Levemir to 16 units bid  Thank you, Billy Fischer. Lyanna Blystone, RN, MSN, CDE  Diabetes Coordinator Inpatient Glycemic Control Team Team Pager [phone] (8am-5pm) 03/17/2020 10:01 AM

## 2020-05-26 ENCOUNTER — Other Ambulatory Visit: Payer: Self-pay

## 2020-05-26 ENCOUNTER — Ambulatory Visit: Payer: BC Managed Care – PPO | Attending: Family Medicine | Admitting: Family Medicine

## 2020-05-26 ENCOUNTER — Telehealth: Payer: Self-pay

## 2020-05-26 ENCOUNTER — Encounter: Payer: Self-pay | Admitting: Family Medicine

## 2020-05-26 VITALS — BP 100/66 | HR 103 | Ht 64.0 in | Wt 105.0 lb

## 2020-05-26 DIAGNOSIS — F333 Major depressive disorder, recurrent, severe with psychotic symptoms: Secondary | ICD-10-CM | POA: Diagnosis not present

## 2020-05-26 DIAGNOSIS — E119 Type 2 diabetes mellitus without complications: Secondary | ICD-10-CM | POA: Diagnosis not present

## 2020-05-26 LAB — POCT URINALYSIS DIP (CLINITEK)
Bilirubin, UA: NEGATIVE
Glucose, UA: >=1000 mg/dL — AB
Ketones, POC UA: NEGATIVE mg/dL
Nitrite, UA: NEGATIVE
POC PROTEIN,UA: NEGATIVE
Spec Grav, UA: 1.015 (ref 1.010–1.025)
Urobilinogen, UA: 0.2 E.U./dL
pH, UA: 5.5 (ref 5.0–8.0)

## 2020-05-26 LAB — POCT GLYCOSYLATED HEMOGLOBIN (HGB A1C): HbA1c, POC (controlled diabetic range): 12.9 % — AB (ref 0.0–7.0)

## 2020-05-26 LAB — GLUCOSE, POCT (MANUAL RESULT ENTRY)
POC Glucose: 488 mg/dl — AB (ref 70–99)
POC Glucose: 309 mg/dl — AB (ref 70–99)

## 2020-05-26 MED ORDER — LANTUS SOLOSTAR 100 UNIT/ML ~~LOC~~ SOPN: 20.0000 [IU] | PEN_INJECTOR | Freq: Every day | SUBCUTANEOUS | 3 refills | Status: DC

## 2020-05-26 MED ORDER — INSULIN ASPART 100 UNIT/ML ~~LOC~~ SOLN
20.0000 [IU] | Freq: Once | SUBCUTANEOUS | Status: AC
  Administered 2020-05-26: 20 [IU] via SUBCUTANEOUS

## 2020-05-26 NOTE — Progress Notes (Signed)
Subjective:  Patient ID: Garrett [name], male    DOB: [birth year]  Age: 35 y.o. MRN: [id number]  CC: New Patient (Initial Visit)   HPI Garrett [name] is a 34 year old male with a history of type 2 diabetes mellitus who presents to establish care.  In 03/2020 he was hospitalized for altered mental status secondary to DKA, major depression with psychotic features. He was commenced on Levemir and NovoLog and subsequently discharged to [facility]. He is accompanied by his friend today and is seen with the aid of an interpreter.  His medication list reveals Novolog sliding scale as well as Novolog 3 units 3 times daily and Levemir 17 units twice daily.  I am unable to obtain the exact information regarding NovoLog administration from his friend as he states he works in [place] and the patient is under the care of his parents while he is away.  He move the patient into stay with him 2 weeks ago to ensure compliance with his medications. The patient just utters sounds but is not really responsive to my questions. His friend states a few months ago he developed severe depression and would need to be prompted to take his medications, eat, shower. Blood sugar in the clinic is 488 and as per the patient's friend the patient received 17 units of Levemir in the morning and also had 10 units of NovoLog around noon.  I am unsure of how he kept about the 10 units of NovoLog.  Past Medical History:  Diagnosis Date  . Depression     No past surgical history on file.  No family history on file.  No Known Allergies  Outpatient Medications Prior to Visit  Medication Sig Dispense Refill  . escitalopram (LEXAPRO) 10 MG tablet Take 1 tablet (10 mg total) by mouth daily. For mood control (Patient taking differently: Take 10 mg by mouth daily.) 30 tablet 0  . feeding supplement (ENSURE ENLIVE / ENSURE PLUS) LIQD Take 237 mLs by mouth 2 (two) times daily between meals. 237 mL 12  . ibuprofen (ADVIL) 400 MG tablet Take  1 tablet (400 mg total) by mouth every 8 (eight) hours as needed for mild pain or moderate pain. 30 tablet 0  . insulin aspart (NOVOLOG) 100 UNIT/ML injection Inject 3 Units into the skin 3 (three) times daily with meals. 10 mL 11  . risperiDONE (RISPERDAL) 2 MG tablet Take 1 tablet (2 mg total) by mouth 2 (two) times daily. For mood control 60 tablet 0  . tamsulosin (FLOMAX) 0.4 MG CAPS capsule Take 1 capsule (0.4 mg total) by mouth daily. 30 capsule 3  . thiamine 100 MG tablet Take 1 tablet (100 mg total) by mouth daily.    . insulin aspart (NOVOLOG) 100 UNIT/ML injection Inject 0-15 Units into the skin 3 (three) times daily with meals. SLIDING SCALE   CBG 70 - 120: 0 units: CBG 121 - 150: 2 units; CBG 151 - 200: 3 units; CBG 201 - 250: 5 units; CBG 251 - 300: 8 units;CBG 301 - 350: 11 units; CBG 351 - 400: 15 units; CBG > 400 : 15 units and notify MD 10 mL 11  . insulin detemir (LEVEMIR) 100 UNIT/ML injection Inject 0.17 mLs (17 Units total) into the skin 2 (two) times daily. 10 mL 11  . LORazepam (ATIVAN) 0.5 MG tablet Take 1 tablet (0.5 mg total) by mouth 2 (two) times daily. For anxiety (Patient not taking: No sig reported) 14 tablet 0  . traZODone (DESYREL)  100 MG tablet Take 1 tablet (100 mg total) by mouth at bedtime as needed for sleep. (Patient not taking: Reported on 05/26/2020) 30 tablet 0   No facility-administered medications prior to visit.     ROS Review of Systems  Reason unable to perform ROS: Patient does not respond to my questions.    Objective:  BP 100/66   Pulse (!) 103   Ht 5\' 4"  (1.626 m)   Wt 105 lb (47.6 kg)   SpO2 99%   BMI 18.02 kg/m   BP/Weight 05/26/2020 03/17/2020 03/16/2020  Systolic BP 100 95 -  Diastolic BP 66 67 -  Wt. (Lbs) 105 - 111.55  BMI 18.02 - 19.15  Some encounter information is confidential and restricted. Go to Review Flowsheets activity to see all data.      Physical Exam Constitutional:      Appearance: He is well-developed.   Neck:     Vascular: No JVD.  Cardiovascular:     Rate and Rhythm: Normal rate.     Heart sounds: Normal heart sounds. No murmur heard.   Pulmonary:     Effort: Pulmonary effort is normal.     Breath sounds: Normal breath sounds. No wheezing or rales.  Chest:     Chest wall: No tenderness.  Abdominal:     General: Bowel sounds are normal. There is no distension.     Palpations: Abdomen is soft. There is no mass.     Tenderness: There is no abdominal tenderness.  Musculoskeletal:        General: Normal range of motion.     Right lower leg: No edema.     Left lower leg: No edema.  Neurological:     Mental Status: He is alert.  Psychiatric:        Behavior: Behavior is withdrawn.     Comments: Appears to be in a catatonic state.     CMP Latest Ref Rng & Units 03/17/2020 03/16/2020 03/15/2020  Glucose 70 - 99 mg/dL 03/17/2020) 654(Y) 503(T)  BUN 6 - 20 mg/dL 15 12 14   Creatinine 0.61 - 1.24 mg/dL 465(K 8.12  Sodium 135 - 145 mmol/L 135 135 136  Potassium 3.5 - 5.1 mmol/L 3.9 3.8 4.0  Chloride 98 - 111 mmol/L 98 100 102  CO2 22 - 32 mmol/L 28 27 27   Calcium 8.9 - 10.3 mg/dL 9.0 9.0 7.51)  Total Protein 6.5 - 8.1 g/dL 6.4(L) 6.1(L) 6.0(L)  Total Bilirubin 0.3 - 1.2 mg/dL 0.6 0.6 1.0  Alkaline Phos 38 - 126 U/L 63 59 58  AST 15 - 41 U/L 46(H) 51(H) 68(H)  ALT 0 - 44 U/L 75(H) 79(H) 80(H)    Lipid Panel     Component Value Date/Time   CHOL 199 06/22/2017 0641   TRIG 107 06/22/2017 0641   HDL 43 06/22/2017 0641   CHOLHDL 4.6 06/22/2017 0641   VLDL 21 06/22/2017 0641   LDLCALC 135 (H) 06/22/2017 0641    CBC    Component Value Date/Time   WBC 6.9 03/14/2020 0422   RBC 2.81 (L) 03/14/2020 0718   RBC 2.81 (L) 03/14/2020 0422   HGB 8.8 (L) 03/17/2020 0128   HCT 25.2 (L) 03/17/2020 0128   PLT 124 (L) 03/14/2020 0422   MCV 96.4 03/14/2020 0422   MCH 33.5 03/14/2020 0422   MCHC 34.7 03/14/2020 0422   RDW 11.5 03/14/2020 0422   LYMPHSABS 1.7 12/11/2008 1500    MONOABS 0.6 12/11/2008 1500   EOSABS 0.3  12/11/2008 1500   BASOSABS 0.1 12/11/2008 1500    Lab Results  Component Value Date   HGBA1C 12.9 (A) 05/26/2020    Assessment & Plan:  1. Diabetes mellitus, new onset (HCC) Uncontrolled with A1c of 12.9 CBG of 488 which puts him at a high risk of progression to DKA.   UA with glycosuria but no ketones NovoLog 20 units administered STAT, oral hydration provided Repeat CBG after 45 minutes was 409 Clinical pharmacist called into the clinic to provide education on medication administration I have discontinued Levemir and substituted with Lantus for ease of administration.  NovoLog sliding scale switch to fixed regimen He will follow up with the clinical pharmacist for titration of his medications I am concerned that compliance will be a major issue given his underlying severe depression.  Case discussed with case manager who will look into home health for this patient - POCT glucose (manual entry) - POCT glycosylated hemoglobin (Hb A1C) - insulin aspart (novoLOG) injection 20 Units - POCT glucose (manual entry) - insulin glargine (LANTUS SOLOSTAR) 100 UNIT/ML Solostar Pen; Inject 20 Units into the skin daily.  Dispense: 30 mL; Refill: 3 - POCT URINALYSIS DIP (CLINITEK) - Basic Metabolic Panel - CBC with Differential/Platelet  2. 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 insulin aspart (novoLOG) injection 20 Units  . insulin glargine (LANTUS SOLOSTAR) 100 UNIT/ML Solostar Pen    Sig: Inject 20 Units into the skin daily.    Dispense:  30 mL    Refill:  3    Discontinue Levemir    50 minutes of total face to face time  spent on education, discussing diagnosis, investigations, administering insulin, hydration and monitoring blood sugar, counseling, coordination of care.  Follow-up: Return in about 1 week (around 06/02/2020) for [facility] for blood sugar log reviewed; 1 month with PCP.       Hoy Register, MD, FAAFP. [facility] [place], [place] [phone]   05/26/2020, 5:41 PM

## 2020-05-26 NOTE — Patient Instructions (Signed)
Diabetes Mellitus and Nutrition, Adult When you have diabetes, or diabetes mellitus, it is very important to have healthy eating habits because your blood sugar (glucose) levels are greatly affected by what you eat and drink. Eating healthy foods in the right amounts, at about the same times every day, can help you:  Control your blood glucose.  Lower your risk of heart disease.  Improve your blood pressure.  Reach or maintain a healthy weight. What can affect my meal plan? Every person with diabetes is different, and each person has different needs for a meal plan. Your health care provider may recommend that you work with a dietitian to make a meal plan that is best for you. Your meal plan may vary depending on factors such as:  The calories you need.  The medicines you take.  Your weight.  Your blood glucose, blood pressure, and cholesterol levels.  Your activity level.  Other health conditions you have, such as heart or kidney disease. How do carbohydrates affect me? Carbohydrates, also called carbs, affect your blood glucose level more than any other type of food. Eating carbs naturally raises the amount of glucose in your blood. Carb counting is a method for keeping track of how many carbs you eat. Counting carbs is important to keep your blood glucose at a healthy level, especially if you use insulin or take certain oral diabetes medicines. It is important to know how many carbs you can safely have in each meal. This is different for every person. Your dietitian can help you calculate how many carbs you should have at each meal and for each snack. How does alcohol affect me? Alcohol can cause a sudden decrease in blood glucose (hypoglycemia), especially if you use insulin or take certain oral diabetes medicines. Hypoglycemia can be a life-threatening condition. Symptoms of hypoglycemia, such as sleepiness, dizziness, and confusion, are similar to symptoms of having too much  alcohol.  Do not drink alcohol if: ? Your health care provider tells you not to drink. ? You are pregnant, may be pregnant, or are planning to become pregnant.  If you drink alcohol: ? Do not drink on an empty stomach. ? Limit how much you use to:  0-1 drink a day for women.  0-2 drinks a day for men. ? Be aware of how much alcohol is in your drink. In the [place] one drink equals one 12 oz bottle of beer (355 mL), one 5 oz glass of wine (148 mL), or one 1 oz glass of hard liquor (44 mL). ? Keep yourself hydrated with water, diet soda, or unsweetened iced tea.  Keep in mind that regular soda, juice, and other mixers may contain a lot of sugar and must be counted as carbs. What are tips for following this plan? Reading food labels  Start by checking the serving size on the "Nutrition Facts" label of packaged foods and drinks. The amount of calories, carbs, fats, and other nutrients listed on the label is based on one serving of the item. Many items contain more than one serving per package.  Check the total grams (g) of carbs in one serving. You can calculate the number of servings of carbs in one serving by dividing the total carbs by 15. For example, if a food has 30 g of total carbs per serving, it would be equal to 2 servings of carbs.  Check the number of grams (g) of saturated fats and trans fats in one serving. Choose foods that have   a low amount or none of these fats.  Check the number of milligrams (mg) of salt (sodium) in one serving. Most people should limit total sodium intake to less than 2,300 mg per day.  Always check the nutrition information of foods labeled as "low-fat" or "nonfat." These foods may be higher in added sugar or refined carbs and should be avoided.  Talk to your dietitian to identify your daily goals for nutrients listed on the label. Shopping  Avoid buying canned, pre-made, or processed foods. These foods tend to be high in fat, sodium, and added  sugar.  Shop around the outside edge of the grocery store. This is where you will most often find fresh fruits and vegetables, bulk grains, fresh meats, and fresh dairy. Cooking  Use low-heat cooking methods, such as baking, instead of high-heat cooking methods like deep frying.  Cook using healthy oils, such as olive, canola, or sunflower oil.  Avoid cooking with butter, cream, or high-fat meats. Meal planning  Eat meals and snacks regularly, preferably at the same times every day. Avoid going long periods of time without eating.  Eat foods that are high in fiber, such as fresh fruits, vegetables, beans, and whole grains. Talk with your dietitian about how many servings of carbs you can eat at each meal.  Eat 4-6 oz (112-168 g) of lean protein each day, such as lean meat, chicken, fish, eggs, or tofu. One ounce (oz) of lean protein is equal to: ? 1 oz (28 g) of meat, chicken, or fish. ? 1 egg. ?  cup (62 g) of tofu.  Eat some foods each day that contain healthy fats, such as avocado, nuts, seeds, and fish.   What foods should I eat? Fruits Berries. Apples. Oranges. Peaches. Apricots. Plums. Grapes. Mango. Papaya. Pomegranate. Kiwi. Cherries. Vegetables Lettuce. Spinach. Leafy greens, including kale, chard, collard greens, and mustard greens. Beets. Cauliflower. Cabbage. Broccoli. Carrots. Green beans. Tomatoes. Peppers. Onions. Cucumbers. Brussels sprouts. Grains Whole grains, such as whole-wheat or whole-grain bread, crackers, tortillas, cereal, and pasta. Unsweetened oatmeal. Quinoa. Brown or wild rice. Meats and other proteins Seafood. Poultry without skin. Lean cuts of poultry and beef. Tofu. Nuts. Seeds. Dairy Low-fat or fat-free dairy products such as milk, yogurt, and cheese. The items listed above may not be a complete list of foods and beverages you can eat. Contact a dietitian for more information. What foods should I avoid? Fruits Fruits canned with  syrup. Vegetables Canned vegetables. Frozen vegetables with butter or cream sauce. Grains Refined white flour and flour products such as bread, pasta, snack foods, and cereals. Avoid all processed foods. Meats and other proteins Fatty cuts of meat. Poultry with skin. Breaded or fried meats. Processed meat. Avoid saturated fats. Dairy Full-fat yogurt, cheese, or milk. Beverages Sweetened drinks, such as soda or iced tea. The items listed above may not be a complete list of foods and beverages you should avoid. Contact a dietitian for more information. Questions to ask a health care provider  Do I need to meet with a diabetes educator?  Do I need to meet with a dietitian?  What number can I call if I have questions?  When are the best times to check my blood glucose? Where to find more information:  American Diabetes Association: diabetes.org  Academy of Nutrition and Dietetics: www.eatright.org  National Institute of Diabetes and Digestive and Kidney Diseases: www.niddk.nih.gov  Association of Diabetes Care and Education Specialists: www.diabeteseducator.org Summary  It is important to have healthy eating   habits because your blood sugar (glucose) levels are greatly affected by what you eat and drink.  A healthy meal plan will help you control your blood glucose and maintain a healthy lifestyle.  Your health care provider may recommend that you work with a dietitian to make a meal plan that is best for you.  Keep in mind that carbohydrates (carbs) and alcohol have immediate effects on your blood glucose levels. It is important to count carbs and to use alcohol carefully. This information is not intended to replace advice given to you by your health care provider. Make sure you discuss any questions you have with your health care provider. Document Revised: 02/25/2019 Document Reviewed: 02/25/2019 Elsevier Patient Education  2021 Elsevier Inc.  

## 2020-05-26 NOTE — Telephone Encounter (Signed)
At request of Dr Margarita Rana, met with the patient when he was in the clinic today .  He was accompanied by friend who speaks [name] and has been trying to assist the patient in the community.   [name] interpreter # [id number]/[email or web address] assisted with the communication.  Christa See, LCSW also present for the meeting.   The patient sat quietly and would not answer questions and would not speak to the interpreter.  His friend provided all of the information.   His friend explained that the patient had been staying with a family but when that family returned to working, the patient needed ongoing assistance/reminders to take medication, eat, bathe and they were no longer able to assist the patient.  The patient has now been living with his friend's family for the past 3-4 days.  However, the friend is only able to assist the patient 3-4 days a week and his family provides assistance the other days. The friend is looking for behavioral support for the patient and this includes inpatient treatment. He said that the patient has received inpatient treatment in the past and it has been very beneficial.  The patient is in agreement to this plan.   The patient's friend was encouraged to take the patient to [facility] as soon as possible.  His friend said he could not take him today but would take him tomorrow.  The address for [facility] was provided.  This CM explained that it is important for the patient to receive behavioral health support to address his severe depression so he will be able to benefit from in home services, such as home health care and personal care services.   This CM confirmed with patient's friend that his contact -  Sha Phla, the patient's pastor speaks English and his contact # [phone].

## 2020-05-26 NOTE — Progress Notes (Signed)
CBG-488 A1C-12.9

## 2020-05-27 LAB — CBC WITH DIFFERENTIAL/PLATELET
Basophils Absolute: 0 10*3/uL (ref 0.0–0.2)
Basos: 0 %
EOS (ABSOLUTE): 0.2 10*3/uL (ref 0.0–0.4)
Eos: 2 %
Hematocrit: 33.5 % — ABNORMAL LOW (ref 37.5–51.0)
Hemoglobin: 11 g/dL — ABNORMAL LOW (ref 13.0–17.7)
Immature Grans (Abs): 0 10*3/uL (ref 0.0–0.1)
Immature Granulocytes: 0 %
Lymphocytes Absolute: 3.4 10*3/uL — ABNORMAL HIGH (ref 0.7–3.1)
Lymphs: 44 %
MCH: 30.1 pg (ref 26.6–33.0)
MCHC: 32.8 g/dL (ref 31.5–35.7)
MCV: 92 fL (ref 79–97)
Monocytes Absolute: 0.6 10*3/uL (ref 0.1–0.9)
Monocytes: 7 %
Neutrophils Absolute: 3.5 10*3/uL (ref 1.4–7.0)
Neutrophils: 47 %
Platelets: 169 10*3/uL (ref 150–450)
RBC: 3.66 x10E6/uL — ABNORMAL LOW (ref 4.14–5.80)
RDW: 13.1 % (ref 11.6–15.4)
WBC: 7.6 10*3/uL (ref 3.4–10.8)

## 2020-05-27 LAB — BASIC METABOLIC PANEL
BUN/Creatinine Ratio: 15 (ref 9–20)
BUN: 11 mg/dL (ref 6–20)
CO2: 23 mmol/L (ref 20–29)
Calcium: 9.5 mg/dL (ref 8.7–10.2)
Chloride: 96 mmol/L (ref 96–106)
Creatinine, Ser: 0.75 mg/dL — ABNORMAL LOW (ref 0.76–1.27)
GFR calc Af Amer: 135 mL/min/{1.73_m2} (ref >59)
GFR calc non Af Amer: 117 mL/min/{1.73_m2} (ref >59)
Glucose: 294 mg/dL — ABNORMAL HIGH (ref 65–99)
Potassium: 3.5 mmol/L (ref 3.5–5.2)
Sodium: 137 mmol/L (ref 134–144)

## 2020-05-28 ENCOUNTER — Telehealth: Payer: Self-pay

## 2020-05-28 NOTE — Telephone Encounter (Signed)
-----   Message from Hoy Register, MD sent at 05/28/2020  2:38 PM EST ----- He is slightly anemic but this has improved compared to previous labs. Glucose is elevated but we had adjusted his regimen at his last visit and he will need to comply with a Diabetic diet

## 2020-05-28 NOTE — Telephone Encounter (Signed)
Patient name and DOB has been verified Patient was informed of lab results. Patient had no questions.  

## 2020-06-01 ENCOUNTER — Ambulatory Visit (HOSPITAL_COMMUNITY): Payer: Medicaid Other | Admitting: Licensed Clinical Social Worker

## 2020-06-01 ENCOUNTER — Other Ambulatory Visit: Payer: Self-pay

## 2020-06-01 DIAGNOSIS — Z711 Person with feared health complaint in whom no diagnosis is made: Secondary | ICD-10-CM

## 2020-06-02 NOTE — Progress Notes (Signed)
This date pt comes for walk in appt accompanied by a friend, [employer] planner, who speaks [place] and [name], pt's language. Pt reportedly speaks no [place]. Win has reportedly known pt ~ 5 yrs. LCSW did through chart review prior to session, noting recent hospitalizations and complications r/t DM. LCSW accessed interpreter via Language Kassie Mends, [id number]. LCSW made attempts to complete assessment. Pt maintained a blank stare most of the time and either did not answer questions or said "I don't know". After a number of attempts to assess pt LCSW advised pt of plan to get additional hx from his friend to address needs. Pt agrees per interpreter. Win states pt "does not talk much anymore". They were directed to [facility] from hosp. Pt reportedly lives with another friend and his fam. Win advises the family works. Pt is reportedly needing help with all ADL's and is incontinent at times. LCSW assessed for care goals/options including ALF placement. Win advises the fam pt is staying with does want to pursue ALF placement since they are not able to adequately meet pt's needs given their work schedules. LCSW provided education on ALF care and advise facility should also have contracted MH services to use prn. LCSW provided education on placement process and made referral to [facility] Pl[facility] at [facility]. No f/u at this facility planned at this time. Win states appreciation and will relay all info to the fam he is currently residing with.   Denton Meek, MSW, LCSW

## 2020-06-11 ENCOUNTER — Other Ambulatory Visit: Payer: Self-pay

## 2020-06-11 ENCOUNTER — Ambulatory Visit: Payer: BC Managed Care – PPO | Attending: Family Medicine | Admitting: Pharmacist

## 2020-06-11 ENCOUNTER — Encounter: Payer: Self-pay | Admitting: Pharmacist

## 2020-06-11 DIAGNOSIS — E119 Type 2 diabetes mellitus without complications: Secondary | ICD-10-CM | POA: Diagnosis not present

## 2020-06-11 MED ORDER — LANTUS SOLOSTAR 100 UNIT/ML ~~LOC~~ SOPN: 26.0000 [IU] | PEN_INJECTOR | Freq: Every day | SUBCUTANEOUS | 3 refills | Status: DC

## 2020-06-11 NOTE — Progress Notes (Signed)
   S:    PCP: Dr. Alvis Lemmings  No chief complaint on file.   Patient arrives in poor spirits. He suffers from mental illness and is non-verbal. I communicated mostly with his friend/caregiver today.  Presents for diabetes evaluation, education, and management. Patient was referred and last seen by Primary Care Provider on 05/26/2020.   I asked several questions and could not get answers. His caregiver attempted to aid where possible, but I was unable to obtain some information.   Family/Social History:  - FHx: none reported - Tobacco: never smoker  - Alcohol: no information given   Insurance coverage/medication affordability: BCBS  Medication adherence reported, per caretaker.   Current diabetes medications include: Lantus 20 units daily, Novolog 3u TID (pt is unable to take Novolog as his caretaker is only able to visit the house 1 time during the day)  Patient denies hypoglycemic events.  Patient reported dietary habits:  - No information given when asked   Patient-reported exercise habits:  - No information given when asked    No information given on hyper- or hypoglycemic symptoms.    O:  Lab Results  Component Value Date   HGBA1C 12.9 (A) 05/26/2020   There were no vitals filed for this visit.  Lipid Panel     Component Value Date/Time   CHOL 199 06/22/2017 0641   TRIG 107 06/22/2017 0641   HDL 43 06/22/2017 0641   CHOLHDL 4.6 06/22/2017 0641   VLDL 21 06/22/2017 0641   LDLCALC 135 (H) 06/22/2017 0641   Home fasting blood sugars: 231 - 380 (404)  2 hour post-meal/random blood sugars: 247 - 334 (2 outliers of 404, 506)  Clinical Atherosclerotic Cardiovascular Disease (ASCVD): No  The ASCVD Risk score Denman George DC Jr., et al., 2013) failed to calculate for the following reasons:   The 2013 ASCVD risk score is only valid for ages 53 to 92    A/P: Diabetes newly dx currently uncontrolled. Patient is able to verbalize appropriate hypoglycemia management plan. Medication  adherence is not optimal. Cannot titrate mealtime insulin as patient's caretaker in only available once daily.  -Increased dose of Lantus to 26 units daily.  -Extensively discussed pathophysiology of diabetes, recommended lifestyle interventions, dietary effects on blood sugar control -Counseled on s/sx of and management of hypoglycemia -Next A1C anticipated 08/2020.   Written patient instructions provided.  Total time in face to face counseling 30 minutes.   Follow up Pharmacist Clinic Visit in 2 weeks.   Butch Penny, PharmD, P[place]y [place], CPP Clinical Pharmacist [facility] [phone]

## 2020-06-25 ENCOUNTER — Ambulatory Visit (HOSPITAL_BASED_OUTPATIENT_CLINIC_OR_DEPARTMENT_OTHER): Payer: BC Managed Care – PPO | Admitting: Family Medicine

## 2020-06-25 ENCOUNTER — Other Ambulatory Visit: Payer: Self-pay

## 2020-06-25 ENCOUNTER — Ambulatory Visit: Payer: BC Managed Care – PPO | Attending: Family Medicine | Admitting: Pharmacist

## 2020-06-25 VITALS — BP 114/77 | HR 108

## 2020-06-25 DIAGNOSIS — E1165 Type 2 diabetes mellitus with hyperglycemia: Secondary | ICD-10-CM

## 2020-06-25 DIAGNOSIS — Z794 Long term (current) use of insulin: Secondary | ICD-10-CM | POA: Diagnosis not present

## 2020-06-25 DIAGNOSIS — E119 Type 2 diabetes mellitus without complications: Secondary | ICD-10-CM

## 2020-06-25 LAB — POCT URINALYSIS DIP (CLINITEK)
Bilirubin, UA: NEGATIVE
Glucose, UA: >=1000 mg/dL — AB
Ketones, POC UA: NEGATIVE mg/dL
Nitrite, UA: NEGATIVE
POC PROTEIN,UA: NEGATIVE
Spec Grav, UA: 1.015 (ref 1.010–1.025)
Urobilinogen, UA: 0.2 E.U./dL
pH, UA: 6.5 (ref 5.0–8.0)

## 2020-06-25 LAB — GLUCOSE, POCT (MANUAL RESULT ENTRY): POC Glucose: 426 mg/dl — AB (ref 70–99)

## 2020-06-25 MED ORDER — INSULIN ASPART 100 UNIT/ML ~~LOC~~ SOLN
15.0000 [IU] | Freq: Once | SUBCUTANEOUS | Status: AC
  Administered 2020-06-25: 15 [IU] via SUBCUTANEOUS

## 2020-06-25 NOTE — Progress Notes (Signed)
   S:    PCP: Dr. Alvis Lemmings  No chief complaint on file.  Patient arrives in poor spirits. He suffers from mental illness and is non-verbal. I communicated mostly with his friend/caregiver today.  Presents for diabetes evaluation, education, and management. Patient was referred and last seen by Primary Care Provider on 05/26/2020. I saw him on 06/11/2020 and increased Lantus to 26u. His caregiver today tells me that he did not make that dose increase.   He has eaten today (~30 minutes ago). Has not taken any insulin today. Per his care provider, he did have Lantus yesterday. In regards to his Humalog, He is unable to administer TID at home bc a caregiver can only visit his home once daily. I asked several questions and pt could not give answers. His caregiver attempted to aid where possible, but I was unable to obtain some information.   Family/Social History:  - FHx: none reported - Tobacco: never smoker  - Alcohol: no information given   Insurance coverage/medication affordability: BCBS  Medication adherence reported with Lantus.  Current diabetes medications include: Lantus 20 units daily, Novolog 3u TID (pt is unable to take Novolog as his caretaker is only able to visit the house 1 time during the day)  Patient denies hypoglycemic events.  Patient reported dietary habits:  - No information given when asked   Patient-reported exercise habits:  - No information given when asked    No information given on hyper- or hypoglycemic symptoms.    O:  POCT: 498 Lab Results  Component Value Date   HGBA1C 12.9 (A) 05/26/2020   There were no vitals filed for this visit.  Lipid Panel     Component Value Date/Time   CHOL 199 06/22/2017 0641   TRIG 107 06/22/2017 0641   HDL 43 06/22/2017 0641   CHOLHDL 4.6 06/22/2017 0641   VLDL 21 06/22/2017 0641   LDLCALC 135 (H) 06/22/2017 0641   Home fasting blood sugars: 234 - 332 2 hour post-meal/random blood sugars: 312 - 383  Clinical  Atherosclerotic Cardiovascular Disease (ASCVD): No  The ASCVD Risk score Denman George DC Jr., et al., 2013) failed to calculate for the following reasons:   The 2013 ASCVD risk score is only valid for ages 53 to 64    A/P: Diabetes newly dx currently uncontrolled. Patient is not able to verbalize appropriate hypoglycemia management plan. Medication adherence is not optimal. Based on CBG today will have Dr. Alvis Lemmings see him.   Written patient instructions provided.  Total time in face to face counseling 30 minutes.   Follow up Pharmacist Clinic Visit in 2 weeks.   Butch Penny, PharmD, [place]y [place], CPP Clinical Pharmacist [facility] [phone]

## 2020-06-25 NOTE — Progress Notes (Signed)
CBG-498 

## 2020-06-25 NOTE — Progress Notes (Signed)
Subjective:  Patient ID: Garrett [name], male    DOB: 01/03/1986  Age: 35 y.o. MRN: [id number]  CC: Diabetes   HPI Garrett [name]  is a 35 year old male with a history of major depression with psychosis, type 2 diabetes mellitus (A1c 12.9) who presents  today for pharmacy visit and was found to have a blood sugar of 498 in the clinic.  He had his breakfast about 30 minutes ago but did not administer NovoLog. Last night he administered 20 units of Lantus as opposed to 26 units which was prescribed.  His Lantus administration has to be supervised due to his mental health condition.  He currently resides with a friend of his who works in [place] and commutes between [place] and [place]. He is accompanied by a volunteer who helps as interpreter.  She informs me she had assist in adjusting the dose of his insulin pen but due to the frequency of NovoLog administration and lack of someone to supervise him this is not being administered. Fasting blood sugars range between 240 and upper 300s  Past Medical History:  Diagnosis Date  . Depression     No past surgical history on file.  No family history on file.  No Known Allergies  Outpatient Medications Prior to Visit  Medication Sig Dispense Refill  . escitalopram (LEXAPRO) 10 MG tablet Take 1 tablet (10 mg total) by mouth daily. For mood control (Patient taking differently: Take 10 mg by mouth daily.) 30 tablet 0  . feeding supplement (ENSURE ENLIVE / ENSURE PLUS) LIQD Take 237 mLs by mouth 2 (two) times daily between meals. 237 mL 12  . ibuprofen (ADVIL) 400 MG tablet Take 1 tablet (400 mg total) by mouth every 8 (eight) hours as needed for mild pain or moderate pain. 30 tablet 0  . insulin aspart (NOVOLOG) 100 UNIT/ML injection Inject 3 Units into the skin 3 (three) times daily with meals. 10 mL 11  . insulin glargine (LANTUS SOLOSTAR) 100 UNIT/ML Solostar Pen Inject 26 Units into the skin daily. 30 mL 3  . LORazepam (ATIVAN) 0.5 MG tablet Take  1 tablet (0.5 mg total) by mouth 2 (two) times daily. For anxiety (Patient not taking: No sig reported) 14 tablet 0  . risperiDONE (RISPERDAL) 2 MG tablet Take 1 tablet (2 mg total) by mouth 2 (two) times daily. For mood control 60 tablet 0  . tamsulosin (FLOMAX) 0.4 MG CAPS capsule Take 1 capsule (0.4 mg total) by mouth daily. 30 capsule 3  . thiamine 100 MG tablet Take 1 tablet (100 mg total) by mouth daily.    . traZODone (DESYREL) 100 MG tablet Take 1 tablet (100 mg total) by mouth at bedtime as needed for sleep. (Patient not taking: Reported on 05/26/2020) 30 tablet 0   No facility-administered medications prior to visit.     ROS Review of Systems  Unable to perform ROS: Other  Not responding to my questions  Objective:  BP 114/77   Pulse (!) 108   SpO2 98%   BP/Weight 06/25/2020 05/26/2020 03/17/2020  Systolic BP 114 100 95  Diastolic BP 77 66 67  Wt. (Lbs) - 105 -  BMI - 18.02 -  Some encounter information is confidential and restricted. Go to Review Flowsheets activity to see all data.      Physical Exam Constitutional:      Appearance: He is well-developed.  Neck:     Vascular: No JVD.  Cardiovascular:     Rate and Rhythm: Tachycardia present.  Heart sounds: Normal heart sounds. No murmur heard.   Pulmonary:     Effort: Pulmonary effort is normal.     Breath sounds: Normal breath sounds. No wheezing or rales.  Chest:     Chest wall: No tenderness.  Abdominal:     General: Bowel sounds are normal. There is no distension.     Palpations: Abdomen is soft. There is no mass.     Tenderness: There is no abdominal tenderness.  Musculoskeletal:        General: Normal range of motion.     Right lower leg: No edema.     Left lower leg: No edema.  Neurological:     Mental Status: He is alert and oriented to person, place, and time.  Psychiatric:        Mood and Affect: Mood normal.     CMP Latest Ref Rng & Units 05/26/2020 03/17/2020 03/16/2020  Glucose 65 -  99 mg/dL 694(W) 546(E) 703(J)  BUN 6 - 20 mg/dL 11 15 12   Creatinine 0.76 - 1.27 mg/dL ) 0.09(F 8.18  Sodium 134 - 144 mmol/L 137 135 135  Potassium 3.5 - 5.2 mmol/L 3.5 3.9 3.8  Chloride 96 - 106 mmol/L 96 98 100  CO2 20 - 29 mmol/L 23 28 27   Calcium 8.7 - 10.2 mg/dL 9.5 9.0 9.0  Total Protein 6.5 - 8.1 g/dL - 6.4(L) 6.1(L)  Total Bilirubin 0.3 - 1.2 mg/dL - 0.6 0.6  Alkaline Phos 38 - 126 U/L - 63 59  AST 15 - 41 U/L - 46(H) 51(H)  ALT 0 - 44 U/L - 75(H) 79(H)    Lipid Panel     Component Value Date/Time   CHOL 199 06/22/2017 0641   TRIG 107 06/22/2017 0641   HDL 43 06/22/2017 0641   CHOLHDL 4.6 06/22/2017 0641   VLDL 21 06/22/2017 0641   LDLCALC 135 (H) 06/22/2017 0641    CBC    Component Value Date/Time   WBC 7.6 05/26/2020 1619   WBC 6.9 03/14/2020 0422   RBC 3.66 (L) 05/26/2020 1619   RBC 2.81 (L) 03/14/2020 0718   RBC 2.81 (L) 03/14/2020 0422   HGB 11.0 (L) 05/26/2020 1619   HCT 33.5 (L) 05/26/2020 1619   PLT 169 05/26/2020 1619   MCV 92 05/26/2020 1619   MCH 30.1 05/26/2020 1619   MCH 33.5 03/14/2020 0422   MCHC 32.8 05/26/2020 1619   MCHC 34.7 03/14/2020 0422   RDW 13.1 05/26/2020 1619   LYMPHSABS 3.4 (H) 05/26/2020 1619   MONOABS 0.6 12/11/2008 1500   EOSABS 0.2 05/26/2020 1619   BASOSABS 0.0 05/26/2020 1619    Lab Results  Component Value Date   HGBA1C 12.9 (A) 05/26/2020    Assessment & Plan:  1. Type 2 diabetes mellitus with hyperglycemia, with long-term current use of insulin (HCC) Uncontrolled with A1c of 12.9 CBG of 498 in the clinic which places him at high risk of DKA Urinalysis checked stat-negative for ketones NovoLog 15 units administered, oral hydration provided Patient observed for 30 minutes and blood sugar repeated Increase Lantus from 20 units to 30 units We will hold off on NovoLog given this is not being administered due to lack of supervision He will follow up with the clinical pharmacist - POCT glucose (manual entry) -  POCT URINALYSIS DIP (CLINITEK) - insulin aspart (novoLOG) injection 15 Units    Meds ordered this encounter  Medications  . insulin aspart (novoLOG) injection 15 Units    Follow-up: Return  in about 1 week (around 07/02/2020) for Follow-up for blood sugar log with Franky Macho.  Keep previously scheduled appointment with me.     Hoy Register, MD, FAAFP. [facility] [place], [place] [phone]   06/25/2020, 9:55 AM

## 2020-07-01 ENCOUNTER — Telehealth: Payer: Self-pay

## 2020-07-01 ENCOUNTER — Other Ambulatory Visit: Payer: Self-pay

## 2020-07-01 ENCOUNTER — Ambulatory Visit: Payer: BC Managed Care – PPO | Attending: Family Medicine | Admitting: Family Medicine

## 2020-07-01 VITALS — BP 104/74 | HR 109 | Ht 62.5 in | Wt 108.0 lb

## 2020-07-01 DIAGNOSIS — E1165 Type 2 diabetes mellitus with hyperglycemia: Secondary | ICD-10-CM

## 2020-07-01 DIAGNOSIS — Z794 Long term (current) use of insulin: Secondary | ICD-10-CM | POA: Diagnosis not present

## 2020-07-01 DIAGNOSIS — F333 Major depressive disorder, recurrent, severe with psychotic symptoms: Secondary | ICD-10-CM | POA: Diagnosis not present

## 2020-07-01 LAB — GLUCOSE, POCT (MANUAL RESULT ENTRY)
POC Glucose: 494 mg/dl — AB (ref 70–99)
POC Glucose: 343 mg/dl — AB (ref 70–99)

## 2020-07-01 LAB — POCT URINALYSIS DIP (CLINITEK)
Bilirubin, UA: NEGATIVE
Blood, UA: NEGATIVE
Glucose, UA: >=1000 mg/dL — AB
Ketones, POC UA: NEGATIVE mg/dL
Leukocytes, UA: NEGATIVE
Nitrite, UA: NEGATIVE
POC PROTEIN,UA: NEGATIVE
Spec Grav, UA: 1.02 (ref 1.010–1.025)
Urobilinogen, UA: NEGATIVE E.U./dL — AB
pH, UA: 5.5 (ref 5.0–8.0)

## 2020-07-01 MED ORDER — INSULIN ASPART 100 UNIT/ML ~~LOC~~ SOLN
30.0000 [IU] | Freq: Once | SUBCUTANEOUS | Status: AC
Start: 2020-07-01 — End: 2020-07-01
  Administered 2020-07-01: 30 [IU] via SUBCUTANEOUS

## 2020-07-01 NOTE — Patient Instructions (Signed)
Diabetes Mellitus and Exercise Exercising regularly is important for overall health, especially for people who have diabetes mellitus. Exercising is not only about losing weight. It has many other health benefits, such as increasing muscle strength and bone density and reducing body fat and stress. This leads to improved fitness, flexibility, and endurance, all of which result in better overall health. What are the benefits of exercise if I have diabetes? Exercise has many benefits for people with diabetes. They include:  Helping to lower and control blood sugar (glucose).  Helping the body to respond better to the hormone insulin by improving insulin sensitivity.  Reducing how much insulin the body needs.  Lowering the risk for heart disease by: ? Lowering "bad" cholesterol and triglyceride levels. ? Increasing "good" cholesterol levels. ? Lowering blood pressure. ? Lowering blood glucose levels. What is my activity plan? Your health care provider or certified diabetes educator can help you make a plan for the type and frequency of exercise that works for you. This is called your activity plan. Be sure to:  Get at least 150 minutes of medium-intensity or high-intensity exercise each week. Exercises may include brisk walking, biking, or water aerobics.  Do stretching and strengthening exercises, such as yoga or weight lifting, at least 2 times a week.  Spread out your activity over at least 3 days of the week.  Get some form of physical activity each day. ? Do not go more than 2 days in a row without some kind of physical activity. ? Avoid being inactive for more than 90 minutes at a time. Take frequent breaks to walk or stretch.  Choose exercises or activities that you enjoy. Set realistic goals.  Start slowly and gradually increase your exercise intensity over time.   How do I manage my diabetes during exercise? Monitor your blood glucose  Check your blood glucose before and  after exercising. If your blood glucose is: ? 240 mg/dL (13.3 mmol/L) or higher before you exercise, check your urine for ketones. These are chemicals created by the liver. If you have ketones in your urine, do not exercise until your blood glucose returns to normal. ? 100 mg/dL (5.6 mmol/L) or lower, eat a snack containing 15-20 grams of carbohydrate. Check your blood glucose 15 minutes after the snack to make sure that your glucose level is above 100 mg/dL (5.6 mmol/L) before you start your exercise.  Know the symptoms of low blood glucose (hypoglycemia) and how to treat it. Your risk for hypoglycemia increases during and after exercise. Follow these tips and your health care provider's instructions  Keep a carbohydrate snack that is fast-acting for use before, during, and after exercise to help prevent or treat hypoglycemia.  Avoid injecting insulin into areas of the body that are going to be exercised. For example, avoid injecting insulin into: ? Your arms, when you are about to play tennis. ? Your legs, when you are about to go jogging.  Keep records of your exercise habits. Doing this can help you and your health care provider adjust your diabetes management plan as needed. Write down: ? Food that you eat before and after you exercise. ? Blood glucose levels before and after you exercise. ? The type and amount of exercise you have done.  Work with your health care provider when you start a new exercise or activity. He or she may need to: ? Make sure that the activity is safe for you. ? Adjust your insulin, other medicines, and food that   you eat.  Drink plenty of water while you exercise. This prevents loss of water (dehydration) and problems caused by a lot of heat in the body (heat stroke).   Where to find more information  American Diabetes Association: www.diabetes.org Summary  Exercising regularly is important for overall health, especially for people who have diabetes  mellitus.  Exercising has many health benefits. It increases muscle strength and bone density and reduces body fat and stress. It also lowers and controls blood glucose.  Your health care provider or certified diabetes educator can help you make an activity plan for the type and frequency of exercise that works for you.  Work with your health care provider to make sure any new activity is safe for you. Also work with your health care provider to adjust your insulin, other medicines, and the food you eat. This information is not intended to replace advice given to you by your health care provider. Make sure you discuss any questions you have with your health care provider. Document Revised: 12/16/2018 Document Reviewed: 12/16/2018 Elsevier Patient Education  2021 Elsevier Inc.  

## 2020-07-01 NOTE — Progress Notes (Signed)
Subjective:  Patient ID: Garrett [name], male    DOB: 1986/02/16  Age: 35 y.o. MRN: [id number]  CC: Diabetes   HPI Garrett [name]  is a 35 year old male with a history of major depression with psychosis, type 2 diabetes mellitus (A1c 12.9) here for follow-up visit.  His blood sugar in the clinic is 494 and he did not administer his Lantus last night. Accompanied by a friend Garrett [name] who goes to check on him on the weekends so there is no one to ensure he administers his Lantus.  He has also been closely followed by the clinical pharmacist.  He is not responsive to my questions and only nods his head in response.  I had referred him to behavioral health after his last visit due to his catatonic state.  He was seen by [facility] behavioral health and notes reviewed indicated they could not provide any further assistance for him and suggested assisted living facility placement.  He did not get to see psychiatry. Garrett [name] states they applied for medicaid as no one can care for him at home. Past Medical History:  Diagnosis Date  . Depression     No past surgical history on file.  No family history on file.  No Known Allergies  Outpatient Medications Prior to Visit  Medication Sig Dispense Refill  . insulin aspart (NOVOLOG) 100 UNIT/ML injection Inject 3 Units into the skin 3 (three) times daily with meals. 10 mL 11  . insulin glargine (LANTUS SOLOSTAR) 100 UNIT/ML Solostar Pen Inject 26 Units into the skin daily. 30 mL 3  . risperiDONE (RISPERDAL) 2 MG tablet Take 1 tablet (2 mg total) by mouth 2 (two) times daily. For mood control 60 tablet 0  . tamsulosin (FLOMAX) 0.4 MG CAPS capsule Take 1 capsule (0.4 mg total) by mouth daily. 30 capsule 3  . thiamine 100 MG tablet Take 1 tablet (100 mg total) by mouth daily.    Marland Kitchen escitalopram (LEXAPRO) 10 MG tablet Take 1 tablet (10 mg total) by mouth daily. For mood control (Patient taking differently: Take 10 mg by mouth daily.) 30 tablet 0  . feeding  supplement (ENSURE ENLIVE / ENSURE PLUS) LIQD Take 237 mLs by mouth 2 (two) times daily between meals. (Patient not taking: Reported on 07/01/2020) 237 mL 12  . ibuprofen (ADVIL) 400 MG tablet Take 1 tablet (400 mg total) by mouth every 8 (eight) hours as needed for mild pain or moderate pain. (Patient not taking: Reported on 07/01/2020) 30 tablet 0  . LORazepam (ATIVAN) 0.5 MG tablet Take 1 tablet (0.5 mg total) by mouth 2 (two) times daily. For anxiety (Patient not taking: No sig reported) 14 tablet 0  . traZODone (DESYREL) 100 MG tablet Take 1 tablet (100 mg total) by mouth at bedtime as needed for sleep. (Patient not taking: No sig reported) 30 tablet 0   No facility-administered medications prior to visit.     ROS Review of Systems  Reason unable to perform ROS: Patient is nonresponsive to my questions.    Objective:  BP 104/74   Pulse (!) 109   Ht 5' 2.5" (1.588 m)   Wt 108 lb (49 kg)   SpO2 100%   BMI 19.44 kg/m   BP/Weight 07/01/2020 06/25/2020 05/26/2020  Systolic BP 104 114 100  Diastolic BP 74 77 66  Wt. (Lbs) 108 - 105  BMI 19.44 - 18.02      Physical Exam Constitutional:      Appearance: He is well-developed.  Neck:     Vascular: No JVD.  Cardiovascular:     Rate and Rhythm: Normal rate.     Heart sounds: Normal heart sounds. No murmur heard.   Pulmonary:     Effort: Pulmonary effort is normal.     Breath sounds: Normal breath sounds. No wheezing or rales.  Chest:     Chest wall: No tenderness.  Abdominal:     General: Bowel sounds are normal. There is no distension.     Palpations: Abdomen is soft. There is no mass.     Tenderness: There is no abdominal tenderness.  Musculoskeletal:        General: Normal range of motion.     Right lower leg: No edema.     Left lower leg: No edema.  Neurological:     Mental Status: He is alert and oriented to person, place, and time.  Psychiatric:        Mood and Affect: Mood normal.     CMP Latest Ref Rng &  Units 05/26/2020 03/17/2020 03/16/2020  Glucose 65 - 99 mg/dL 428(J) 681(L) 572(I)  BUN 6 - 20 mg/dL 11 15 12   Creatinine 0.76 - 1.27 mg/dL ) 2.03(T 5.97  Sodium 134 - 144 mmol/L 137 135 135  Potassium 3.5 - 5.2 mmol/L 3.5 3.9 3.8  Chloride 96 - 106 mmol/L 96 98 100  CO2 20 - 29 mmol/L 23 28 27   Calcium 8.7 - 10.2 mg/dL 9.5 9.0 9.0  Total Protein 6.5 - 8.1 g/dL - 6.4(L) 6.1(L)  Total Bilirubin 0.3 - 1.2 mg/dL - 0.6 0.6  Alkaline Phos 38 - 126 U/L - 63 59  AST 15 - 41 U/L - 46(H) 51(H)  ALT 0 - 44 U/L - 75(H) 79(H)    Lipid Panel     Component Value Date/Time   CHOL 199 06/22/2017 0641   TRIG 107 06/22/2017 0641   HDL 43 06/22/2017 0641   CHOLHDL 4.6 06/22/2017 0641   VLDL 21 06/22/2017 0641   LDLCALC 135 (H) 06/22/2017 0641    CBC    Component Value Date/Time   WBC 7.6 05/26/2020 1619   WBC 6.9 03/14/2020 0422   RBC 3.66 (L) 05/26/2020 1619   RBC 2.81 (L) 03/14/2020 0718   RBC 2.81 (L) 03/14/2020 0422   HGB 11.0 (L) 05/26/2020 1619   HCT 33.5 (L) 05/26/2020 1619   PLT 169 05/26/2020 1619   MCV 92 05/26/2020 1619   MCH 30.1 05/26/2020 1619   MCH 33.5 03/14/2020 0422   MCHC 32.8 05/26/2020 1619   MCHC 34.7 03/14/2020 0422   RDW 13.1 05/26/2020 1619   LYMPHSABS 3.4 (H) 05/26/2020 1619   MONOABS 0.6 12/11/2008 1500   EOSABS 0.2 05/26/2020 1619   BASOSABS 0.0 05/26/2020 1619    Lab Results  Component Value Date   HGBA1C 12.9 (A) 05/26/2020    Assessment & Plan:   1. Type 2 diabetes mellitus with hyperglycemia, with long-term current use of insulin (HCC) Uncontrolled with A1c of 12.9, blood sugar of 494 in the clinic This places him at high risk for progression to DKA Urine negative for ketones NovoLog 30 units administered along with oral hydration and patient observed for 40 minutes after which blood sugar was repeated This is a very difficult situation with his compliance affected by his persistent catatonic state and severe depression.  He also has no  caregiver to ensure compliance His friend with him promises to work with him on administering insulin. I have advised  him to switch his Lantus to morning which will make it easier for him to ensure the patient receives his Lantus I would love to refer him to endocrine for consideration for an insulin pump however this will need the patient's cooperation which I am unsure which can get given his underlying mental health situation - POCT glucose (manual entry) - POCT URINALYSIS DIP (CLINITEK) - insulin aspart (novoLOG) injection 30 Units - POCT glucose (manual entry)  2. MDD (major depressive disorder), recurrent, severe, with psychosis (HCC) Uncontrolled Referred to [facility] behavioral health with no improvement I will refer him to [facility] I have had the case manager meet with him to discuss available options.  Skilled nursing facility might not be in his best interest given this patient is nonverbal and will not receive optimal treatment for his mental condition.  We will work on trying to get him into see psychiatry. - Ambulatory referral to Psychiatry   Meds ordered this encounter  Medications  . insulin aspart (novoLOG) injection 30 Units    Follow-up: Return in about 1 week (around 07/08/2020) for Follow-up for blood sugars with Franky Macho.  1 month with PCP.    45 minutes of total face to face time spent and greater than 50% of time spent on discussing diagnosis, investigations, treatment plan, counseling, coordination of care, monitoring patient after administering insulin    Hoy Register, MD, FAAFP. [facility] [place], [place] [phone]   07/01/2020, 2:39 PM

## 2020-07-01 NOTE — Telephone Encounter (Addendum)
Met with the patient and his friend, Win, who speaks [place].  She was inquiring about ALF for the patient as they are not able to provide sufficient care for him at home.  She is working with DSS to apply for special assistance medicaid for facility placement and has a case worker assigned to his case. Provided her a list of ALF in the area and explained that not all accept medicaid but she can call to obtain more information. The patient did interact and just sat and stared straight ahead  This CM explained that behavioral health support for the patient is essential and she agreed.  If he is followed by a behavioral health clinician, he may be eligible for supportive housing that would be more appropriate for him than ALF.she was in agreement to placing a behavioral health referral for him.   Darliss Cheney, case manager to complete referral for [place]

## 2020-07-05 ENCOUNTER — Telehealth: Payer: Self-pay

## 2020-07-05 NOTE — Telephone Encounter (Addendum)
Neuropsychiatric Referral has been faxed on behalf of the patient.

## 2020-07-07 ENCOUNTER — Telehealth: Payer: Self-pay

## 2020-07-07 NOTE — Telephone Encounter (Signed)
Reached out to the facility to check the status of a referral from [facility] faxed over on 07/01/20. LVM for the referral coordinator to contact Care Coordinator.

## 2020-07-11 ENCOUNTER — Other Ambulatory Visit: Payer: Self-pay

## 2020-07-11 ENCOUNTER — Ambulatory Visit (HOSPITAL_COMMUNITY)
Admission: EM | Admit: 2020-07-11 | Discharge: 2020-07-11 | Disposition: A | Discharge status: Home / Self Care | Payer: BC Managed Care – PPO | Attending: Family | Admitting: Family

## 2020-07-11 DIAGNOSIS — F333 Major depressive disorder, recurrent, severe with psychotic symptoms: Secondary | ICD-10-CM

## 2020-07-11 MED ORDER — ESCITALOPRAM OXALATE 10 MG PO TABS: 10.0000 mg | ORAL_TABLET | Freq: Every day | ORAL | 0 refills | Status: DC

## 2020-07-11 MED ORDER — RISPERIDONE 2 MG PO TABS: 2.0000 mg | ORAL_TABLET | Freq: Two times a day (BID) | ORAL | 0 refills | Status: DC

## 2020-07-11 MED ORDER — ESCITALOPRAM OXALATE 10 MG PO TABS: 10.0000 mg | ORAL_TABLET | Freq: Once | ORAL | 0 refills | Status: DC

## 2020-07-11 MED ORDER — RISPERIDONE 0.5 MG PO TABS: 0.5000 mg | ORAL_TABLET | Freq: Two times a day (BID) | ORAL | Status: DC

## 2020-07-11 MED ORDER — ESCITALOPRAM OXALATE 10 MG PO TABS: 10.0000 mg | ORAL_TABLET | Freq: Once | ORAL | Status: DC

## 2020-07-11 NOTE — ED Provider Notes (Signed)
Behavioral Health Urgent Care Medical Screening Exam  Patient Name: Garrett [name] MRN: [id number] Date of Evaluation: 07/11/20 Chief Complaint:   Diagnosis:  Final diagnoses:  MDD (major depressive disorder), recurrent, severe, with psychosis (HCC)    History of Present illness: Garrett [name] is a 35 y.o. male.  Presents accompanied with volunteer interpreter due to reported history of depression and decline in ADLs.  She reports patient has not been bathing and unsure when he was last taking his medication.  States patient resides with friends and stated that  He has any family support.  NP spoke to interpreter via language line for [facility] translation. [phone]) Castin denied suicidal or homicidal ideations.  Denies auditory or visual hallucinations.  Denies paranoia paranoid ideations.  Patient responses were short and abrupt.  He is awake alert and oriented to self and place only.  Does not report " headache."  Per volunteer interpreter patient is seeking assisted living to help with motivation and ADLs.  In person interpreted reported patient has a follow-up appointment next Thursday to manage his diabetes. NP offered to refill her medications to include Risperdal and Lexapro.  CSW to follow-up with patient for additional resources for housing and Medicaid.   Psychiatric Specialty Exam  Presentation  General Appearance:Casual  Eye Contact:Good  Speech:Clear and Coherent (short and abrut)  Speech Volume:Decreased  Handedness:Right   Mood and Affect  Mood:Depressed  Affect:Blunt   Thought Process  Thought Processes:Coherent  Descriptions of Associations:Intact  Orientation:Partial (Person and Place)  Thought Content:Logical    Hallucinations:None  Ideas of Reference:None  Suicidal Thoughts:No  Homicidal Thoughts:No   Sensorium  Memory:Immediate Fair; Recent Fair; Remote Fair  Judgment:Poor  Insight:Lacking   Executive Functions   Concentration:Poor  Attention Span:Poor  Recall:Poor  Fund of Knowledge:Poor  Language:Fair   Psychomotor Activity  Psychomotor Activity:Normal   Assets  Assets:Housing; Physical Health   Sleep  Sleep:Fair  Number of hours: No data recorded  Nutritional Assessment (For OBS and FBC admissions only) Has the patient had a weight loss or gain of 10 pounds or more in the last 3 months?: No Has the patient had a decrease in food intake/or appetite?: No Does the patient have dental problems?: No Does the patient have eating habits or behaviors that may be indicators of an eating disorder including binging or inducing vomiting?: No Has the patient recently lost weight without trying?: No Has the patient been eating poorly because of a decreased appetite?: No Malnutrition Screening Tool Score: 0    Physical Exam: Physical Exam Vitals reviewed.  HENT:     Head: Normocephalic.  Psychiatric:        Attention and Perception: Attention normal.        Mood and Affect: Mood normal.        Speech: Speech normal.        Behavior: Behavior normal.        Thought Content: Thought content normal.        Cognition and Memory: Cognition is impaired.        Judgment: Judgment normal.    Review of Systems  Neurological: Positive for headaches.  Psychiatric/Behavioral: Positive for depression. Negative for hallucinations and suicidal ideas. The patient is not nervous/anxious.   All other systems reviewed and are negative.  Blood pressure (!) 127/97, pulse 93, temperature 97.7 F (36.5 C), temperature source Tympanic, resp. rate 20, SpO2 100 %. There is no height or weight on file to calculate BMI.  Musculoskeletal: Strength & Muscle Tone:  within normal limits Gait & Station: normal Patient leans: N/A   [facility] MSE Discharge Disposition for Follow up and Recommendations: Based on my evaluation the patient does not appear to have an emergency medical condition and can be  discharged with resources and follow up care in outpatient services for Medication Management, Partial Hospitalization Program, Individual Therapy and Group Therapy   Oneta Rack, NP 07/11/2020, 11:59 AM

## 2020-07-11 NOTE — ED Provider Notes (Incomplete)
Behavioral Health Urgent Care Medical Screening Exam  Patient Name: Rontavious Albright MRN: [id number] Date of Evaluation: 07/11/20 Chief Complaint:   Diagnosis:  Final diagnoses:  None    History of Present illness: Chang Tiggs is a 35 y.o. male. [id number]   Psychiatric Specialty Exam  Presentation  General Appearance:Casual  Eye Contact:Good  Speech:No data recorded Speech Volume:No data recorded Handedness:No data recorded  Mood and Affect  Mood:Depressed; Dysphoric  Affect:Congruent   Thought Process  Thought Processes:No data recorded Descriptions of Associations:No data recorded Orientation:No data recorded Thought Content:No data recorded   Hallucinations:No data recorded Ideas of Reference:No data recorded Suicidal Thoughts:No data recorded Homicidal Thoughts:No data recorded  Sensorium  Memory:No data recorded Judgment:No data recorded Insight:No data recorded  Executive Functions  Concentration:No data recorded Attention Span:No data recorded Recall:No data recorded Fund of Knowledge:No data recorded Language:No data recorded  Psychomotor Activity  Psychomotor Activity:No data recorded  Assets  Assets:No data recorded  Sleep  Sleep:No data recorded Number of hours: No data recorded  No data recorded  Physical Exam: Physical Exam ROS Blood pressure (!) 127/97, pulse 93, temperature 97.7 F (36.5 C), temperature source Tympanic, resp. rate 20, SpO2 100 %. There is no height or weight on file to calculate BMI.  Musculoskeletal: Strength & Muscle Tone: {desc; muscle tone:32375} Gait & Station: {PE GAIT ED NATL:22525} Patient leans: {Patient Leans:21022755}   [facility] MSE Discharge Disposition for Follow up and Recommendations: {[facility] MSE Recommendations:24277}   Oneta Rack, NP 07/11/2020, 11:40 AM

## 2020-07-11 NOTE — Progress Notes (Signed)
Patient presents to the ED with a volunteer interpreter.  He has a history of depression and was being treated through the [facility].  Patient was last seenapproximately 5 weeks ago.  Patient has run out of his medication and his condition is worsening.  When he was last seen by his therapist, she felt like patient needed to be in a facility where he could be monitored 24 hours a day, but patient does not have Medicaid.  His friend states that htey have applied for it, but he needs documentation that he is metally ill and not able to care for himself.  She states that patient has not been talking, he has been staying in bed a lot and he has been incontinent.  Patient is not currently suicidal, not  homicidal and she states that he has not been experiencing any hallucinations.  She states that he has been eating well.  The main purpose for today's visit appears to be that they are seeking placement in an assisted living facility because she states that there is no one to assist him where he is living.

## 2020-07-11 NOTE — Discharge Instructions (Addendum)
Take all medications as prescribed. Keep all follow-up appointments as scheduled.  Do not consume alcohol or use illegal drugs while on prescription medications. Report any adverse effects from your medications to your primary care provider promptly.  In the event of recurrent symptoms or worsening symptoms, call 911, a crisis hotline, or go to the nearest emergency department for evaluation.   

## 2020-07-12 NOTE — Telephone Encounter (Signed)
Jessica [facility] called to let you know they are unable to take the pt at this time.   He needs a higher level of care than what they can provide.. Once he is talking and more stable, you can send a new referral.

## 2020-07-13 ENCOUNTER — Ambulatory Visit: Payer: BC Managed Care – PPO | Attending: Family Medicine | Admitting: Pharmacist

## 2020-07-13 ENCOUNTER — Other Ambulatory Visit: Payer: Self-pay

## 2020-07-13 DIAGNOSIS — E119 Type 2 diabetes mellitus without complications: Secondary | ICD-10-CM

## 2020-07-13 DIAGNOSIS — Z794 Long term (current) use of insulin: Secondary | ICD-10-CM

## 2020-07-13 DIAGNOSIS — E1165 Type 2 diabetes mellitus with hyperglycemia: Secondary | ICD-10-CM

## 2020-07-13 LAB — GLUCOSE, POCT (MANUAL RESULT ENTRY): POC Glucose: 252 mg/dl — AB (ref 70–99)

## 2020-07-13 MED ORDER — LANTUS SOLOSTAR 100 UNIT/ML ~~LOC~~ SOPN: 32.0000 [IU] | PEN_INJECTOR | Freq: Every day | SUBCUTANEOUS | 3 refills | Status: DC

## 2020-07-13 NOTE — Progress Notes (Signed)
   S:    PCP: Dr. Alvis Lemmings  No chief complaint on file.  Patient arrives in poor spirits. He suffers from mental illness and will not respond to most of my questions. When he does respond, he usually shakes or nods his head. I communicated mostly with his friend/caregiver today.  Presents for diabetes evaluation, education, and management. Patient was referred and last seen by Primary Care Provider on 07/01/2020.  He has not eaten today. Has not taken any insulin today as he is taking his Lantus qhs. Did take 26u as prescribed last night.  Family/Social History:  - FHx: none reported - Tobacco: never smoker  - Alcohol: no information given   Insurance coverage/medication affordability: BCBS  Medication adherence reported with Lantus.  Current diabetes medications include: Lantus 26 units daily  Patient denies hypoglycemic events.  Patient reported dietary habits:  - No information given when asked   Patient-reported exercise habits:  - No information given when asked    Pt endorses polyuria, neuropathy.  Denies blurred vision   O:  POCT: 252  Lab Results  Component Value Date   HGBA1C 12.9 (A) 05/26/2020   There were no vitals filed for this visit.  Lipid Panel     Component Value Date/Time   CHOL 199 06/22/2017 0641   TRIG 107 06/22/2017 0641   HDL 43 06/22/2017 0641   CHOLHDL 4.6 06/22/2017 0641   VLDL 21 06/22/2017 0641   LDLCALC 135 (H) 06/22/2017 0641   Home fasting blood sugars: (93 - 345)  2 hour post-meal/random blood sugars: (301 - 409)  Clinical Atherosclerotic Cardiovascular Disease (ASCVD): No  The ASCVD Risk score Denman George DC Jr., et al., 2013) failed to calculate for the following reasons:   The 2013 ASCVD risk score is only valid for ages 61 to 22   A/P: Diabetes newly dx currently uncontrolled. Today's CBG reveals some improvement since last visit (252 down from 343 last visit). Patient is not able to verbalize appropriate hypoglycemia management  plan. Medication adherence is improved, however, he is still taking insulin before bedtime.  -Increase dose of Lantus to 32 units daily.  -Start injecting insulin in the morning.  -Hypoglycemia management plan covered with his friend accompanying him today. She is the one who helps the patient administer his insulin.  -Counseled on lifestyle modifications including dietary and exercise modifications needed to achieve better control. Given patient's catatonic state, I am unsure if he is understanding the information. He has family at home who help him prepare meals so I covered this with his friend who was with him today.   Written patient instructions provided.  Total time in face to face counseling 30 minutes.   Follow up Pharmacist Clinic Visit in 2 weeks.   Butch Penny, PharmD, P[place]y [place], CPP Clinical Pharmacist [facility] [phone]

## 2020-07-14 ENCOUNTER — Encounter: Payer: Self-pay | Admitting: Pharmacist

## 2020-07-21 NOTE — Telephone Encounter (Signed)
Following up on the referral sent on 07/01/20 to [facility]   [facility]: Shanda Bumps [facility] called to let you know they are unable to take the pt at this time. He needs a higher level of care than what they can provide. Once he is talking and more stable, you can send a new referral

## 2020-07-21 NOTE — Telephone Encounter (Signed)
Thanks for the update.  Is it possible to give the urgent care at behavioral health a call to see if this patient will be a candidate for inpatient management?  Thank you.

## 2020-07-22 NOTE — Telephone Encounter (Signed)
After reaching out

## 2020-07-22 NOTE — Telephone Encounter (Signed)
After several attempts today to speak with a staff person from [facility], we were unsuccessful. Will check into some new resources next week. Thanks for all you do.

## 2020-07-28 ENCOUNTER — Encounter: Payer: Self-pay | Admitting: Pharmacist

## 2020-07-28 ENCOUNTER — Other Ambulatory Visit: Payer: Self-pay

## 2020-07-28 ENCOUNTER — Ambulatory Visit: Payer: BC Managed Care – PPO | Attending: Family Medicine | Admitting: Pharmacist

## 2020-07-28 DIAGNOSIS — Z794 Long term (current) use of insulin: Secondary | ICD-10-CM | POA: Diagnosis not present

## 2020-07-28 DIAGNOSIS — E1165 Type 2 diabetes mellitus with hyperglycemia: Secondary | ICD-10-CM | POA: Diagnosis not present

## 2020-07-28 DIAGNOSIS — E119 Type 2 diabetes mellitus without complications: Secondary | ICD-10-CM | POA: Diagnosis not present

## 2020-07-28 LAB — GLUCOSE, POCT (MANUAL RESULT ENTRY): POC Glucose: 332 mg/dl — AB (ref 70–99)

## 2020-07-28 MED ORDER — LANTUS SOLOSTAR 100 UNIT/ML ~~LOC~~ SOPN: 36.0000 [IU] | PEN_INJECTOR | Freq: Every day | SUBCUTANEOUS | 2 refills | Status: DC

## 2020-07-28 MED ORDER — METFORMIN HCL ER 500 MG PO TB24: 500.0000 mg | ORAL_TABLET | Freq: Every day | ORAL | 2 refills | Status: DC

## 2020-07-28 MED ORDER — CONTOUR NEXT TEST VI STRP: ORAL_STRIP | 2 refills | Status: DC

## 2020-07-28 NOTE — Progress Notes (Signed)
   S:    PCP: Dr. Alvis Lemmings  No chief complaint on file.  Patient arrives in poor spirits. He suffers from mental illness and will not respond to most of my questions. When he does respond, he usually shakes or nods his head. I communicated mostly with his friend/caregiver today.  Presents for diabetes evaluation, education, and management. Patient was referred and last seen by Primary Care Provider on 07/01/2020. Last seen by pharmacy on 07/13/2020 and at that visit lantus was increased and pt was instructed to start taking insulin in the morning.  Today, medication reported as poor. Endorses several missed doses in the past two weeks due to caregiver being busy with other things. However, caregiver reports that pt has started receiving insulin in the AM since last visit. Pt remains very quiet and does not engage much during the interaction.  Family/Social History:  - FHx: none reported - Tobacco: never smoker  - Alcohol: no information given   Insurance coverage/medication affordability: BCBS  Medication adherence reported with Lantus but has missed several doses since last seeing [place].  Current diabetes medications include: Lantus 32 units daily  Patient denies hypoglycemic events.  Patient reported dietary habits:  - No information given when asked   Patient-reported exercise habits:  - No information given when asked    Pt endorses blurred vision, neuropathy.  Denies polyuria.   O:  POCT: 332  Lab Results  Component Value Date   HGBA1C 12.9 (A) 05/26/2020   There were no vitals filed for this visit.  Lipid Panel     Component Value Date/Time   CHOL 199 06/22/2017 0641   TRIG 107 06/22/2017 0641   HDL 43 06/22/2017 0641   CHOLHDL 4.6 06/22/2017 0641   VLDL 21 06/22/2017 0641   LDLCALC 135 (H) 06/22/2017 0641   Home fasting blood sugars: (139-359)  2 hour post-meal/random blood sugars: 251-326)  Clinical Atherosclerotic Cardiovascular Disease (ASCVD): No  The ASCVD  Risk score Denman George DC Jr., et al., 2013) failed to calculate for the following reasons:   The 2013 ASCVD risk score is only valid for ages 56 to 39   A/P: Diabetes newly dx currently uncontrolled. Today's CBG was elevated this morning but pt did not receive insulin this morning. Patient is not able to verbalize appropriate hypoglycemia management plan. Medication adherence is poor, but caregiver said that she would do better. Did have a discussion about pt starting metformin and caregiver said she could give it at the same time as insulin. Counseled on metformin adverse effects and will increase insulin to further lower sugars. -Increase dose of Lantus to 36 units daily. -Start metformin 500 mg XR daily  -Hypoglycemia management plan covered with his friend accompanying him today. She is the one who helps the patient administer his insulin.  -Counseled on lifestyle modifications including dietary and exercise modifications needed to achieve better control. Given patient's catatonic state, I am unsure if he is understanding the information. He has family at home who help him prepare meals so I covered this with his friend who was with him today.   Written patient instructions provided.  Total time in face to face counseling 30 minutes.   Follow up Pharmacist Clinic Visit in 2 weeks.   Butch Penny, PharmD, P[place]y [place], CPP Clinical Pharmacist [facility] & [facility] [phone]

## 2020-07-28 NOTE — Patient Instructions (Signed)
Thank you for coming to see me today. Please do the following:  1. Increase Lantus to 36 units daily.  2. Start metformin. Take 1 tablet in the morning.  3. Continue checking blood sugars at home.  4. Continue making the lifestyle changes we've discussed together during our visit. Diet and exercise play a significant role in improving your blood sugars.  5. Follow-up with me in 2 weeks.    Hypoglycemia or low blood sugar:   Low blood sugar can happen quickly and may become an emergency if not treated right away.   While this shouldn't happen often, it can be brought upon if you skip a meal or do not eat enough. Also, if your insulin or other diabetes medications are dosed too high, this can cause your blood sugar to go to low.   Warning signs of low blood sugar include: 1. Feeling shaky or dizzy 2. Feeling weak or tired  3. Excessive hunger 4. Feeling anxious or upset  5. Sweating even when you aren't exercising  What to do if I experience low blood sugar? 1. Check your blood sugar with your meter. If lower than 70, proceed to step 2.  2. Treat with 3-4 glucose tablets or 3 packets of regular sugar. If these aren't around, you can try hard candy. Yet another option would be to drink 4 ounces of fruit juice or 6 ounces of REGULAR soda.  3. Re-check your sugar in 15 minutes. If it is still below 70, do what you did in step 2 again. If has come back up, go ahead and eat a snack or small meal at this time.

## 2020-07-29 ENCOUNTER — Telehealth: Payer: Self-pay

## 2020-07-29 NOTE — Telephone Encounter (Signed)
Reached out to [employer] [facility]) on behalf of the patient. Staff member shared this location does not see patients without insurance, they are open Monday-Friday from 8 am to 5pm, appointments are made by phone. No walk in hours.

## 2020-07-30 NOTE — Telephone Encounter (Signed)
He has [employer].

## 2020-08-02 ENCOUNTER — Telehealth: Payer: Self-pay

## 2020-08-02 NOTE — Telephone Encounter (Signed)
Reached out to Win (patient's contact) regarding the status of medicaid application. She did shared the behavioral status of the patient seemed about the same although he is actively taking his medication. She shared the patient has been approved for Medicaid. CM shared patient behavioral health resources, encouraged them to follow up as well as contact Case Managers here are the [facility] for assistance.

## 2020-08-02 NOTE — Telephone Encounter (Signed)
Reached out to Win (patient's contact) regarding the status of medicaid application. She shared the patient has been approved for Medicaid. She did shared the behavioral status of the patient seemed about the same although he is actively taking his medication. CM shared information for [facility], encouraged them to follow up as well as contact Case Managers here are the [facility] for assistance.   Win wasn't sure if the BCBS is still active.

## 2020-08-13 ENCOUNTER — Ambulatory Visit: Payer: BC Managed Care – PPO | Admitting: Pharmacist

## 2020-08-16 ENCOUNTER — Ambulatory Visit: Payer: BC Managed Care – PPO | Attending: Family Medicine | Admitting: Pharmacist

## 2020-08-16 ENCOUNTER — Other Ambulatory Visit: Payer: Self-pay

## 2020-08-16 DIAGNOSIS — E119 Type 2 diabetes mellitus without complications: Secondary | ICD-10-CM

## 2020-08-16 DIAGNOSIS — Z794 Long term (current) use of insulin: Secondary | ICD-10-CM

## 2020-08-16 DIAGNOSIS — E1165 Type 2 diabetes mellitus with hyperglycemia: Secondary | ICD-10-CM

## 2020-08-16 LAB — GLUCOSE, POCT (MANUAL RESULT ENTRY): POC Glucose: 297 mg/dl — AB (ref 70–99)

## 2020-08-16 MED ORDER — LANTUS SOLOSTAR 100 UNIT/ML ~~LOC~~ SOPN: 40.0000 [IU] | PEN_INJECTOR | Freq: Every day | SUBCUTANEOUS | 2 refills | Status: DC

## 2020-08-16 MED ORDER — METFORMIN HCL ER 500 MG PO TB24: 1000.0000 mg | ORAL_TABLET | Freq: Every day | ORAL | 2 refills | Status: DC

## 2020-08-16 NOTE — Progress Notes (Signed)
S:    PCP: Dr. Alvis Lemmings  No chief complaint on file.  Patient arrives in poor spirits. He suffers from mental illness and will not respond to most of my questions. When he does respond, he usually shakes or nods his head. I communicated mostly with his friend/caregiver today.  Presents for diabetes evaluation, education, and management. Patient was referred and last seen by Primary Care Provider on 07/01/2020. Last seen by pharmacy on 07/28/2020 and at that visit lantus was increased and pt was instructed to start metformin.   Today, medication adherence reported as poor. Endorses several missed doses in the past two weeks due to caregiver not feeling well. Pt remains unengaged with conversation.  Family/Social History:  - FHx: none reported - Tobacco: never smoker  - Alcohol: no information given   Insurance coverage/medication affordability: BCBS  Medication adherence reported with Lantus but has missed several doses since last seeing [place]. His caretaker tells me that she was unable to visit the patient last week.  Current diabetes medications include: Lantus 36 units daily, metformin 500 mg XR  Patient denies hypoglycemic events.  Patient reported dietary habits:  - No information given when asked  - Caretaker reports rice and vegetables   Patient-reported exercise habits:  - No information given when asked    Pt endorses blurred vision, neuropathy.  Denies polyuria.   O:  POCT: 297 (fasting)  Lab Results  Component Value Date   HGBA1C 12.9 (A) 05/26/2020   There were no vitals filed for this visit.  Lipid Panel     Component Value Date/Time   CHOL 199 06/22/2017 0641   TRIG 107 06/22/2017 0641   HDL 43 06/22/2017 0641   CHOLHDL 4.6 06/22/2017 0641   VLDL 21 06/22/2017 0641   LDLCALC 135 (H) 06/22/2017 0641   Home fasting blood sugars: 286 - 433 2 hour post-meal/random blood sugars: 319 - 454  Clinical Atherosclerotic Cardiovascular Disease (ASCVD): No  The  ASCVD Risk score Denman George DC Jr., et al., 2013) failed to calculate for the following reasons:   The 2013 ASCVD risk score is only valid for ages 33 to 52   A/P: Diabetes newly dx currently uncontrolled. Patient is not able to verbalize appropriate hypoglycemia management plan. Medication adherence is poor and this is evidenced in his sugar readings. We could consider a weekly GLP-1 RA in this patient for compliance reasons, but I will hold off given his borderline underweight BMI. Multiple daily injections not an option at this point with limited home care. Ideally, we would use basal/bolus regimen. Did have a discussion about pt titrating metformin and caregiver said she could give it at the same time as insulin. Counseled on metformin adverse effects and will increase insulin to further lower sugars. -Increase dose of Lantus to 40 units daily. -Increase metformin to 1000 mg XR daily. After 1 week, increase to 2000 mg daily if tolerable.  -Hypoglycemia management plan covered with his friend accompanying him today. She is the one who helps the patient administer his insulin.  -Counseled on lifestyle modifications including dietary and exercise modifications needed to achieve better control. Given patient's catatonic state, I am unsure if he is understanding the information. He has family at home who help him prepare meals so I covered this with his friend who was with him today.   Written patient instructions provided.  Total time in face to face counseling 30 minutes.   Follow up PCP Clinic Visit in 3 weeks.   Franky Macho  Lois Huxley, PharmD, BCACP, CPP Clinical Pharmacist [facility] & [facility] [phone]

## 2020-08-16 NOTE — Patient Instructions (Signed)
Thank you for coming to see me today. Please do the following:  1. Increase Lantus to 40 units a day.  2. Start taking 2 tablets of metformin every day. After 1 week, if he is tolerating this increase to 4 tablets once a day.  3. Continue checking blood sugars at home.  4. Continue making the lifestyle changes we've discussed together during our visit. Diet and exercise play a significant role in improving your blood sugars.  5. Follow-up with PCP in June.    Hypoglycemia or low blood sugar:   Low blood sugar can happen quickly and may become an emergency if not treated right away.   While this shouldn't happen often, it can be brought upon if you skip a meal or do not eat enough. Also, if your insulin or other diabetes medications are dosed too high, this can cause your blood sugar to go to low.   Warning signs of low blood sugar include: 1. Feeling shaky or dizzy 2. Feeling weak or tired  3. Excessive hunger 4. Feeling anxious or upset  5. Sweating even when you aren't exercising  What to do if I experience low blood sugar? 1. Check your blood sugar with your meter. If lower than 70, proceed to step 2.  2. Treat with 3-4 glucose tablets or 3 packets of regular sugar. If these aren't around, you can try hard candy. Yet another option would be to drink 4 ounces of fruit juice or 6 ounces of REGULAR soda.  3. Re-check your sugar in 15 minutes. If it is still below 70, do what you did in step 2 again. If has come back up, go ahead and eat a snack or small meal at this time.

## 2020-09-03 ENCOUNTER — Telehealth: Payer: Self-pay

## 2020-09-03 MED ORDER — BASAGLAR KWIKPEN 100 UNIT/ML ~~LOC~~ SOPN: 40.0000 [IU] | PEN_INJECTOR | Freq: Every day | SUBCUTANEOUS | 2 refills | Status: DC

## 2020-09-03 NOTE — Telephone Encounter (Signed)
Rx sent for Basaglar. 

## 2020-09-03 NOTE — Telephone Encounter (Signed)
Basaglar is preferred.  If appropriate, can you resend Lantus as Hospital doctor to [employer]?

## 2020-09-07 ENCOUNTER — Other Ambulatory Visit: Payer: Self-pay

## 2020-09-07 ENCOUNTER — Ambulatory Visit: Payer: Medicaid Other | Attending: Family Medicine | Admitting: Family Medicine

## 2021-03-04 ENCOUNTER — Inpatient Hospital Stay (HOSPITAL_COMMUNITY)
Admission: EM | Admit: 2021-03-04 | Discharge: 2021-07-12 | DRG: 442 | Disposition: A | Discharge status: Nursing Home (Includes ICF) | Payer: Medicaid Other | Source: Ambulatory Visit | Attending: Internal Medicine | Admitting: Internal Medicine

## 2021-03-04 DIAGNOSIS — R768 Other specified abnormal immunological findings in serum: Secondary | ICD-10-CM | POA: Diagnosis not present

## 2021-03-04 DIAGNOSIS — E1142 Type 2 diabetes mellitus with diabetic polyneuropathy: Secondary | ICD-10-CM | POA: Diagnosis present

## 2021-03-04 DIAGNOSIS — R197 Diarrhea, unspecified: Secondary | ICD-10-CM | POA: Diagnosis present

## 2021-03-04 DIAGNOSIS — I10 Essential (primary) hypertension: Secondary | ICD-10-CM | POA: Diagnosis present

## 2021-03-04 DIAGNOSIS — E119 Type 2 diabetes mellitus without complications: Secondary | ICD-10-CM

## 2021-03-04 DIAGNOSIS — F333 Major depressive disorder, recurrent, severe with psychotic symptoms: Secondary | ICD-10-CM | POA: Diagnosis present

## 2021-03-04 DIAGNOSIS — E1165 Type 2 diabetes mellitus with hyperglycemia: Secondary | ICD-10-CM | POA: Diagnosis present

## 2021-03-04 DIAGNOSIS — R7401 Elevation of levels of liver transaminase levels: Secondary | ICD-10-CM | POA: Diagnosis present

## 2021-03-04 DIAGNOSIS — I951 Orthostatic hypotension: Secondary | ICD-10-CM | POA: Diagnosis present

## 2021-03-04 DIAGNOSIS — Z7984 Long term (current) use of oral hypoglycemic drugs: Secondary | ICD-10-CM

## 2021-03-04 DIAGNOSIS — I700 Atherosclerosis of aorta: Secondary | ICD-10-CM | POA: Diagnosis present

## 2021-03-04 DIAGNOSIS — R338 Other retention of urine: Secondary | ICD-10-CM | POA: Diagnosis present

## 2021-03-04 DIAGNOSIS — B181 Chronic viral hepatitis B without delta-agent: Principal | ICD-10-CM | POA: Diagnosis present

## 2021-03-04 DIAGNOSIS — N3289 Other specified disorders of bladder: Secondary | ICD-10-CM

## 2021-03-04 DIAGNOSIS — E11649 Type 2 diabetes mellitus with hypoglycemia without coma: Secondary | ICD-10-CM | POA: Diagnosis not present

## 2021-03-04 DIAGNOSIS — S0101XA Laceration without foreign body of scalp, initial encounter: Secondary | ICD-10-CM | POA: Diagnosis present

## 2021-03-04 DIAGNOSIS — R296 Repeated falls: Secondary | ICD-10-CM | POA: Diagnosis present

## 2021-03-04 DIAGNOSIS — E1143 Type 2 diabetes mellitus with diabetic autonomic (poly)neuropathy: Secondary | ICD-10-CM | POA: Diagnosis present

## 2021-03-04 DIAGNOSIS — M545 Low back pain, unspecified: Secondary | ICD-10-CM

## 2021-03-04 DIAGNOSIS — D649 Anemia, unspecified: Secondary | ICD-10-CM

## 2021-03-04 DIAGNOSIS — R5381 Other malaise: Secondary | ICD-10-CM | POA: Diagnosis present

## 2021-03-04 DIAGNOSIS — T43595A Adverse effect of other antipsychotics and neuroleptics, initial encounter: Secondary | ICD-10-CM | POA: Diagnosis present

## 2021-03-04 DIAGNOSIS — Z20822 Contact with and (suspected) exposure to covid-19: Secondary | ICD-10-CM | POA: Diagnosis present

## 2021-03-04 DIAGNOSIS — W1830XA Fall on same level, unspecified, initial encounter: Secondary | ICD-10-CM | POA: Diagnosis present

## 2021-03-04 DIAGNOSIS — Z8639 Personal history of other endocrine, nutritional and metabolic disease: Secondary | ICD-10-CM

## 2021-03-04 DIAGNOSIS — D638 Anemia in other chronic diseases classified elsewhere: Secondary | ICD-10-CM | POA: Diagnosis present

## 2021-03-04 DIAGNOSIS — R339 Retention of urine, unspecified: Secondary | ICD-10-CM | POA: Diagnosis not present

## 2021-03-04 DIAGNOSIS — Z91128 Patient's intentional underdosing of medication regimen for other reason: Secondary | ICD-10-CM

## 2021-03-04 DIAGNOSIS — Z681 Body mass index (BMI) 19 or less, adult: Secondary | ICD-10-CM

## 2021-03-04 DIAGNOSIS — B1910 Unspecified viral hepatitis B without hepatic coma: Secondary | ICD-10-CM

## 2021-03-04 DIAGNOSIS — K719 Toxic liver disease, unspecified: Secondary | ICD-10-CM | POA: Diagnosis present

## 2021-03-04 DIAGNOSIS — K5900 Constipation, unspecified: Secondary | ICD-10-CM

## 2021-03-04 DIAGNOSIS — G629 Polyneuropathy, unspecified: Secondary | ICD-10-CM

## 2021-03-04 DIAGNOSIS — D696 Thrombocytopenia, unspecified: Secondary | ICD-10-CM | POA: Diagnosis not present

## 2021-03-04 DIAGNOSIS — R7989 Other specified abnormal findings of blood chemistry: Secondary | ICD-10-CM

## 2021-03-04 DIAGNOSIS — R5383 Other fatigue: Secondary | ICD-10-CM | POA: Diagnosis not present

## 2021-03-04 DIAGNOSIS — E860 Dehydration: Secondary | ICD-10-CM | POA: Diagnosis present

## 2021-03-04 DIAGNOSIS — L210 Seborrhea capitis: Secondary | ICD-10-CM | POA: Diagnosis present

## 2021-03-04 DIAGNOSIS — Z79899 Other long term (current) drug therapy: Secondary | ICD-10-CM

## 2021-03-04 DIAGNOSIS — Z794 Long term (current) use of insulin: Secondary | ICD-10-CM

## 2021-03-04 DIAGNOSIS — R531 Weakness: Secondary | ICD-10-CM | POA: Diagnosis present

## 2021-03-04 DIAGNOSIS — Z87891 Personal history of nicotine dependence: Secondary | ICD-10-CM

## 2021-03-04 HISTORY — DX: Type 2 diabetes mellitus without complications: E11.9

## 2021-03-04 NOTE — ED Triage Notes (Signed)
Patient's friend requesting psychiatric evaluation for patient , reports signs of feeling sad , with poor appetite , non verbal, no interest and sluggish for the past several days .

## 2021-03-04 NOTE — ED Provider Notes (Signed)
Emergency Medicine Provider Triage Evaluation Note  Garrett [name] , a 35 y.o. male  was evaluated in triage.  Pt is accompanied by friend who provides history. Friend's contact information is on the chart name is Garrett [name], Garrett [name], Garrett [name].  Friend reports that patient stays with friends parents here in [place].  Friend reports that patient has not been eating.  He has not been taking any of his diabetes medications and has been essentially nonverbal and noninteractive.  He describes multiple behaviors that sound catatonic.  Review of Systems  Positive: None eating, not taking care of hygiene, minimally verbal Negative: Sickness  Physical Exam  BP (!) 130/97 (BP Location: Left Arm)   Pulse 94   Temp 98 F (36.7 C)   Resp 20   SpO2 100%  Gen:   Awake, no distress   Resp:  Normal effort  MSK:   Moves extremities without difficulty  Other:  Patient is able to tell me his name however speaks very quietly.  He answers [facility].  He does not otherwise interact or answer questions.  Medical Decision Making  Medically screening exam initiated at 11:29 PM.  Appropriate orders placed.  Garrett [name] was informed that the remainder of the evaluation will be completed by another provider, this initial triage assessment does not replace that evaluation, and the importance of remaining in the ED until their evaluation is complete.  Patient brought in by his friend who describes what sounds like questionable catatonic behavior.  Patient does have significant history of diabetes that is previously been uncontrolled and required admission.  Since like he is not taking any of his medication.  Will require medical clearance.   Cristina Gong, PA-C 03/04/21 2332    Derwood Kaplan, MD 03/05/21 [phone]

## 2021-03-05 ENCOUNTER — Encounter (HOSPITAL_COMMUNITY): Payer: Self-pay

## 2021-03-05 ENCOUNTER — Other Ambulatory Visit: Payer: Self-pay

## 2021-03-05 LAB — CBC WITH DIFFERENTIAL/PLATELET
Abs Immature Granulocytes: 0.01 10*3/uL (ref 0.00–0.07)
Basophils Absolute: 0 10*3/uL (ref 0.0–0.1)
Basophils Relative: 1 %
Eosinophils Absolute: 0.2 10*3/uL (ref 0.0–0.5)
Eosinophils Relative: 3 %
HCT: 35 % — ABNORMAL LOW (ref 39.0–52.0)
Hemoglobin: 12 g/dL — ABNORMAL LOW (ref 13.0–17.0)
Immature Granulocytes: 0 %
Lymphocytes Relative: 47 %
Lymphs Abs: 2.9 10*3/uL (ref 0.7–4.0)
MCH: 30.9 pg (ref 26.0–34.0)
MCHC: 34.3 g/dL (ref 30.0–36.0)
MCV: 90.2 fL (ref 80.0–100.0)
Monocytes Absolute: 0.5 10*3/uL (ref 0.1–1.0)
Monocytes Relative: 8 %
Neutro Abs: 2.5 10*3/uL (ref 1.7–7.7)
Neutrophils Relative %: 41 %
Platelets: 166 10*3/uL (ref 150–400)
RBC: 3.88 MIL/uL — ABNORMAL LOW (ref 4.22–5.81)
RDW: 11.9 % (ref 11.5–15.5)
WBC: 6.2 10*3/uL (ref 4.0–10.5)
nRBC: 0 % (ref 0.0–0.2)

## 2021-03-05 LAB — CBG MONITORING, ED
Glucose-Capillary: 175 mg/dL — ABNORMAL HIGH (ref 70–99)
Glucose-Capillary: 307 mg/dL — ABNORMAL HIGH (ref 70–99)

## 2021-03-05 LAB — URINALYSIS, ROUTINE W REFLEX MICROSCOPIC
Bilirubin Urine: NEGATIVE
Glucose, UA: >=500 mg/dL — AB
Ketones, ur: NEGATIVE mg/dL
Nitrite: NEGATIVE
Protein, ur: NEGATIVE mg/dL
Specific Gravity, Urine: 1.039 — ABNORMAL HIGH (ref 1.005–1.030)
pH: 6 (ref 5.0–8.0)

## 2021-03-05 LAB — COMPREHENSIVE METABOLIC PANEL
ALT: 86 U/L — ABNORMAL HIGH (ref 0–44)
AST: 60 U/L — ABNORMAL HIGH (ref 15–41)
Albumin: 3.6 g/dL (ref 3.5–5.0)
Alkaline Phosphatase: 86 U/L (ref 38–126)
Anion gap: 7 (ref 5–15)
BUN: 15 mg/dL (ref 6–20)
CO2: 29 mmol/L (ref 22–32)
Calcium: 9.2 mg/dL (ref 8.9–10.3)
Chloride: 100 mmol/L (ref 98–111)
Creatinine, Ser: 0.81 mg/dL (ref 0.61–1.24)
GFR, Estimated: >60 mL/min (ref >60)
Glucose, Bld: 503 mg/dL (ref 70–99)
Potassium: 3.6 mmol/L (ref 3.5–5.1)
Sodium: 136 mmol/L (ref 135–145)
Total Bilirubin: 0.2 mg/dL — ABNORMAL LOW (ref 0.3–1.2)
Total Protein: 7.5 g/dL (ref 6.5–8.1)

## 2021-03-05 LAB — I-STAT VENOUS BLOOD GAS, ED
Acid-Base Excess: 4 mmol/L — ABNORMAL HIGH (ref 0.0–2.0)
Bicarbonate: 31.3 mmol/L — ABNORMAL HIGH (ref 20.0–28.0)
Calcium, Ion: 1.22 mmol/L (ref 1.15–1.40)
HCT: 37 % — ABNORMAL LOW (ref 39.0–52.0)
Hemoglobin: 12.6 g/dL — ABNORMAL LOW (ref 13.0–17.0)
O2 Saturation: 66 %
Potassium: 3.6 mmol/L (ref 3.5–5.1)
Sodium: 139 mmol/L (ref 135–145)
TCO2: 33 mmol/L — ABNORMAL HIGH (ref 22–32)
pCO2, Ven: 60.6 mmHg — ABNORMAL HIGH (ref 44.0–60.0)
pH, Ven: 7.322 (ref 7.250–7.430)
pO2, Ven: 38 mmHg (ref 32.0–45.0)

## 2021-03-05 LAB — AMMONIA: Ammonia: 19 umol/L (ref 9–35)

## 2021-03-05 LAB — ETHANOL: Alcohol, Ethyl (B): <10 mg/dL (ref <10)

## 2021-03-05 LAB — RAPID URINE DRUG SCREEN, HOSP PERFORMED
Amphetamines: NOT DETECTED
Barbiturates: NOT DETECTED
Benzodiazepines: NOT DETECTED
Cocaine: NOT DETECTED
Opiates: NOT DETECTED
Tetrahydrocannabinol: NOT DETECTED

## 2021-03-05 LAB — ACETAMINOPHEN LEVEL: Acetaminophen (Tylenol), Serum: <10 ug/mL — ABNORMAL LOW (ref 10–30)

## 2021-03-05 LAB — RESP PANEL BY RT-PCR (FLU A&B, COVID) ARPGX2
Influenza A by PCR: NEGATIVE
Influenza B by PCR: NEGATIVE
SARS Coronavirus 2 by RT PCR: NEGATIVE

## 2021-03-05 LAB — SALICYLATE LEVEL: Salicylate Lvl: <7 mg/dL — ABNORMAL LOW (ref 7.0–30.0)

## 2021-03-05 MED ORDER — LACTATED RINGERS IV BOLUS
1000.0000 mL | Freq: Once | INTRAVENOUS | Status: AC
  Administered 2021-03-05: 1000 mL via INTRAVENOUS

## 2021-03-05 MED ORDER — ESCITALOPRAM OXALATE 10 MG PO TABS
10.0000 mg | ORAL_TABLET | Freq: Every day | ORAL | Status: DC
  Administered 2021-03-05 – 2021-07-12 (×129): 10 mg via ORAL
  Filled 2021-03-05 (×130): qty 1

## 2021-03-05 MED ORDER — RISPERIDONE 2 MG PO TABS
2.0000 mg | ORAL_TABLET | Freq: Two times a day (BID) | ORAL | Status: DC
  Administered 2021-03-05 – 2021-05-05 (×124): 2 mg via ORAL
  Filled 2021-03-05 (×12): qty 2
  Filled 2021-03-05: qty 1
  Filled 2021-03-05 (×15): qty 2
  Filled 2021-03-05: qty 1
  Filled 2021-03-05 (×5): qty 2
  Filled 2021-03-05: qty 1
  Filled 2021-03-05 (×3): qty 2
  Filled 2021-03-05: qty 1
  Filled 2021-03-05 (×16): qty 2
  Filled 2021-03-05: qty 1
  Filled 2021-03-05 (×13): qty 2
  Filled 2021-03-05: qty 1
  Filled 2021-03-05 (×31): qty 2
  Filled 2021-03-05: qty 1
  Filled 2021-03-05 (×14): qty 2
  Filled 2021-03-05: qty 1
  Filled 2021-03-05 (×10): qty 2

## 2021-03-05 MED ORDER — INSULIN ASPART 100 UNIT/ML IJ SOLN
10.0000 [IU] | Freq: Once | INTRAMUSCULAR | Status: AC
  Administered 2021-03-05: 10 [IU] via SUBCUTANEOUS

## 2021-03-05 MED ORDER — THIAMINE HCL 100 MG PO TABS
100.0000 mg | ORAL_TABLET | Freq: Every day | ORAL | Status: DC
  Administered 2021-03-05 – 2021-07-12 (×129): 100 mg via ORAL
  Filled 2021-03-05 (×130): qty 1

## 2021-03-05 MED ORDER — LOPERAMIDE HCL 2 MG PO CAPS
4.0000 mg | ORAL_CAPSULE | ORAL | Status: DC | PRN
  Administered 2021-03-05 – 2021-05-01 (×23): 4 mg via ORAL
  Filled 2021-03-05 (×23): qty 2

## 2021-03-05 MED ORDER — TAMSULOSIN HCL 0.4 MG PO CAPS
0.4000 mg | ORAL_CAPSULE | Freq: Every day | ORAL | Status: DC
  Administered 2021-03-05 – 2021-04-14 (×41): 0.4 mg via ORAL
  Filled 2021-03-05 (×48): qty 1

## 2021-03-05 MED ORDER — LORAZEPAM 0.5 MG PO TABS
0.5000 mg | ORAL_TABLET | Freq: Two times a day (BID) | ORAL | Status: DC
  Administered 2021-03-05 – 2021-03-09 (×10): 0.5 mg via ORAL
  Filled 2021-03-05 (×10): qty 1

## 2021-03-05 MED ORDER — IBUPROFEN 400 MG PO TABS
400.0000 mg | ORAL_TABLET | Freq: Three times a day (TID) | ORAL | Status: DC | PRN
  Administered 2021-03-10 – 2021-05-06 (×4): 400 mg via ORAL
  Filled 2021-03-05 (×4): qty 1

## 2021-03-05 MED ORDER — INSULIN GLARGINE-YFGN 100 UNIT/ML ~~LOC~~ SOLN
40.0000 [IU] | Freq: Every day | SUBCUTANEOUS | Status: DC
  Administered 2021-03-05 – 2021-04-13 (×39): 40 [IU] via SUBCUTANEOUS
  Filled 2021-03-05 (×47): qty 0.4

## 2021-03-05 MED ORDER — HALOPERIDOL 5 MG PO TABS
5.0000 mg | ORAL_TABLET | Freq: Once | ORAL | Status: AC
  Administered 2021-03-05: 5 mg via ORAL
  Filled 2021-03-05: qty 1

## 2021-03-05 MED ORDER — METFORMIN HCL ER 500 MG PO TB24
1000.0000 mg | ORAL_TABLET | Freq: Every day | ORAL | Status: DC
  Administered 2021-03-05 – 2021-04-14 (×40): 1000 mg via ORAL
  Filled 2021-03-05 (×42): qty 2

## 2021-03-05 NOTE — ED Notes (Signed)
Small diarrhea stool, skin and peri care done, linens changed. Patient assisted with rolling side to side in bed.

## 2021-03-05 NOTE — ED Provider Notes (Signed)
[facility] EMERGENCY DEPARTMENT Provider Note   CSN: [id number] Arrival date & time: 03/04/21  2216     History Chief Complaint  Patient presents with   Psychiatric Evaluation     Glucose=503    Garrett [name] is a 35 y.o. male.  35 year old male the presents emerged from today with a friend of his secondary to psychiatric complaints.  Son that the patient has intermittent episodes of schizophrenia with negative symptoms.  He has been hospitalized multiple times in the past.  He will do okay for a year or 2 but then slowly get worse when he stops his medications.  Sounds like over the last few months he is progressively gotten worse with weight loss, decreased eating decreased activity, decreased interaction and just not acting himself so his friend brings him in here.  Patient will not interact with me but is able to look around and make eye contact but will not answer questions       Past Medical History:  Diagnosis Date   Depression     Patient Active Problem List   Diagnosis Date Noted   Altered mental status    DKA (diabetic ketoacidosis) ([place]) 03/09/2020   Diabetes mellitus, new onset ([place]) 99991111   Acute metabolic encephalopathy 99991111   Elevated LFTs 03/09/2020   Major depressive disorder, recurrent severe without psychotic features ([place]) 06/20/2017   MDD (major depressive disorder), recurrent, severe, with psychosis ([place]) 06/19/2017    History reviewed. No pertinent surgical history.     History reviewed. No pertinent family history.  Social History   Tobacco Use   Smoking status: Never   Smokeless tobacco: Never    Home Medications Prior to Admission medications   Medication Sig Start Date End Date Taking? Authorizing Provider  escitalopram (LEXAPRO) 10 MG tablet Take 1 tablet (10 mg total) by mouth daily. For mood control 07/11/20   Derrill Center, NP  feeding supplement (ENSURE ENLIVE / ENSURE PLUS) LIQD Take 237 mLs by mouth 2  (two) times daily between meals. Patient not taking: Reported on 07/01/2020 03/17/20   Rai, Vernelle Emerald, MD  glucose blood (CONTOUR NEXT TEST) test strip Use to check blood sugar up to TID. 07/28/20   Charlott Rakes, MD  ibuprofen (ADVIL) 400 MG tablet Take 1 tablet (400 mg total) by mouth every 8 (eight) hours as needed for mild pain or moderate pain. Patient not taking: Reported on 07/01/2020 03/17/20   Rai, Vernelle Emerald, MD  Insulin Glargine [facility] KWIKPEN) 100 UNIT/ML Inject 40 Units into the skin daily. 09/03/20   Charlott Rakes, MD  LORazepam (ATIVAN) 0.5 MG tablet Take 1 tablet (0.5 mg total) by mouth 2 (two) times daily. For anxiety Patient not taking: No sig reported 07/05/17   Money, Lowry Ram, FNP  metFORMIN (GLUCOPHAGE-XR) 500 MG 24 hr tablet Take 2 tablets (1,000 mg total) by mouth daily with breakfast. After 1 week, increase to 4 tablets by mouth daily if tolerable. 08/16/20   Charlott Rakes, MD  risperiDONE (RISPERDAL) 2 MG tablet Take 1 tablet (2 mg total) by mouth 2 (two) times daily. For mood control 07/11/20   Derrill Center, NP  tamsulosin (FLOMAX) 0.4 MG CAPS capsule Take 1 capsule (0.4 mg total) by mouth daily. 03/18/20   Rai, Vernelle Emerald, MD  thiamine 100 MG tablet Take 1 tablet (100 mg total) by mouth daily. 03/17/20   Mendel Corning, MD    Allergies    Patient has no known allergies.  Review of Systems   Review of Systems  Unable to perform ROS: Psychiatric disorder   Physical Exam Updated Vital Signs BP 90/63 (BP Location: Left Arm)   Pulse 94   Temp 97.8 F (36.6 C) (Oral)   Resp 18   SpO2 98%   Physical Exam Vitals and nursing note reviewed.  Constitutional:      Appearance: He is well-developed.  HENT:     Head: Normocephalic and atraumatic.     Nose: Nose normal. No congestion or rhinorrhea.     Mouth/Throat:     Mouth: Mucous membranes are moist.     Pharynx: Oropharynx is clear.  Eyes:     Conjunctiva/sclera: Conjunctivae normal.     Pupils: Pupils  are equal, round, and reactive to light.  Cardiovascular:     Rate and Rhythm: Normal rate.  Pulmonary:     Effort: Pulmonary effort is normal. No respiratory distress.  Abdominal:     General: Abdomen is flat. There is no distension.  Musculoskeletal:        General: No swelling or tenderness. Normal range of motion.     Cervical back: Normal range of motion.  Skin:    General: Skin is warm and dry.     Coloration: Skin is not jaundiced or pale.  Neurological:     General: No focal deficit present.     Mental Status: He is alert.    ED Results / Procedures / Treatments   Labs (all labs ordered are listed, but only abnormal results are displayed) Labs Reviewed  COMPREHENSIVE METABOLIC PANEL - Abnormal; Notable for the following components:      Result Value   Glucose, Bld 503 (*)    AST 60 (*)    ALT 86 (*)    Total Bilirubin 0.2 (*)    All other components within normal limits  CBC WITH DIFFERENTIAL/PLATELET - Abnormal; Notable for the following components:   RBC 3.88 (*)    Hemoglobin 12.0 (*)    HCT 35.0 (*)    All other components within normal limits  ACETAMINOPHEN LEVEL - Abnormal; Notable for the following components:   Acetaminophen (Tylenol), Serum <10 (*)    All other components within normal limits  SALICYLATE LEVEL - Abnormal; Notable for the following components:   Salicylate Lvl <7.0 (*)    All other components within normal limits  URINALYSIS, ROUTINE W REFLEX MICROSCOPIC - Abnormal; Notable for the following components:   Color, Urine STRAW (*)    Specific Gravity, Urine 1.039 (*)    Glucose, UA >=500 (*)    Hgb urine dipstick SMALL (*)    Leukocytes,Ua SMALL (*)    Bacteria, UA RARE (*)    All other components within normal limits  I-STAT VENOUS BLOOD GAS, ED - Abnormal; Notable for the following components:   pCO2, Ven 60.6 (*)    Bicarbonate 31.3 (*)    TCO2 33 (*)    Acid-Base Excess 4.0 (*)    HCT 37.0 (*)    Hemoglobin 12.6 (*)    All other  components within normal limits  CBG MONITORING, ED - Abnormal; Notable for the following components:   Glucose-Capillary 175 (*)    All other components within normal limits  CBG MONITORING, ED - Abnormal; Notable for the following components:   Glucose-Capillary 307 (*)    All other components within normal limits  RESP PANEL BY RT-PCR (FLU A&B, COVID) ARPGX2  AMMONIA  ETHANOL  RAPID URINE DRUG SCREEN,  HOSP PERFORMED    EKG EKG Interpretation  Date/Time:  Friday March 04 2021 23:48:16 EST Ventricular Rate:  88 PR Interval:  144 QRS Duration: 78 QT Interval:  356 QTC Calculation: 430 R Axis:   73 Text Interpretation: Normal sinus rhythm Normal ECG When compared to prior, similar appearance. NO STEMI Confirmed by Antony Blackbird [phone]) on 03/05/2021 4:31:55 PM  Radiology No results found.  Procedures .Critical Care Performed by: Merrily Pew, MD Authorized by: Merrily Pew, MD   Critical care provider statement:    Critical care time (minutes):  32   Critical care time was exclusive of:  Separately billable procedures and treating other patients and teaching time   Critical care was necessary to treat or prevent imminent or life-threatening deterioration of the following conditions:  Metabolic crisis   Critical care was time spent personally by me on the following activities:  Development of treatment plan with patient or surrogate, evaluation of patient's response to treatment, examination of patient, obtaining history from patient or surrogate, review of old charts, re-evaluation of patient's condition, pulse oximetry, ordering and performing treatments and interventions and ordering and review of laboratory studies   Medications Ordered in ED Medications  escitalopram (LEXAPRO) tablet 10 mg (10 mg Oral Given 03/05/21 0935)  ibuprofen (ADVIL) tablet 400 mg (has no administration in time range)  insulin glargine-yfgn (SEMGLEE) injection 40 Units (40 Units Subcutaneous  Given 03/05/21 1029)  LORazepam (ATIVAN) tablet 0.5 mg (0.5 mg Oral Given 03/05/21 2122)  metFORMIN (GLUCOPHAGE-XR) 24 hr tablet 1,000 mg (1,000 mg Oral Given 03/05/21 0935)  risperiDONE (RISPERDAL) tablet 2 mg (2 mg Oral Given 03/05/21 2202)  thiamine tablet 100 mg (100 mg Oral Given 03/05/21 0935)  tamsulosin (FLOMAX) capsule 0.4 mg (0.4 mg Oral Given 03/05/21 0935)  loperamide (IMODIUM) capsule 4 mg (4 mg Oral Given 03/05/21 2121)  insulin aspart (novoLOG) injection 10 Units (10 Units Subcutaneous Given 03/05/21 0409)  lactated ringers bolus 1,000 mL (0 mLs Intravenous Stopped 03/05/21 0539)  haloperidol (HALDOL) tablet 5 mg (5 mg Oral Given 03/05/21 1636)    ED Course  I have reviewed the triage vital signs and the nursing notes.  Pertinent labs & imaging results that were available during my care of the patient were reviewed by me and considered in my medical decision making (see chart for details).  Clinical Course as of 03/06/21 0304  Sat Mar 05, 2021  0705 Schizophrenia, negative symptoms, +/- catatonia  [MK]    Clinical Course User Index [MK] Kommor, [place], MD   MDM Rules/Calculators/A&P                         Patient does not appear to be catatonic necessarily however does seem to have acute psychosis with negative symptoms.  His glucose was high but not in DKA so went ahead and got that under control.  Once that was improved consult TTS for evaluation and further management.  I ordered his home medications however he is refusing take medicines so not sure how helpful that will be.   Final Clinical Impression(s) / ED Diagnoses Final diagnoses:  None    Rx / DC Orders ED Discharge Orders     None        Nyla Creason, Corene Cornea, MD 03/06/21 [phone]

## 2021-03-05 NOTE — ED Provider Notes (Signed)
While waiting for TTS evaluation, I was informed by nursing that patient started rubbing feces all over his body and face and was pacing around the room.  Patient will be given a dose of Imodium to help with the diarrhea that he is having and will also give a dose of Haldol to help him calm down and help settle him from pacing around while covered in feces.     Garrett [name], Canary Brim, MD 03/05/21 [phone]

## 2021-03-05 NOTE — ED Notes (Signed)
Used BSC per request. Standing at bed side table and eating lunch.

## 2021-03-05 NOTE — ED Notes (Signed)
PT found out of bed in room washing his hands in bedpan full of feces and urine, Pt cleaned up and placed back in bed. Charge nurse notified of need for sitter, Ophelia Charter is moving from another room to come sit with Pt.

## 2021-03-05 NOTE — ED Notes (Signed)
Pt has had multiple large liquid/loose stools, has been given immodium 4 mg x's 2 will continue to monitor stool output.

## 2021-03-06 DIAGNOSIS — F333 Major depressive disorder, recurrent, severe with psychotic symptoms: Secondary | ICD-10-CM | POA: Diagnosis not present

## 2021-03-06 DIAGNOSIS — F209 Schizophrenia, unspecified: Secondary | ICD-10-CM | POA: Diagnosis not present

## 2021-03-06 NOTE — BH Assessment (Addendum)
Comprehensive Clinical Assessment (CCA) Note  03/06/2021 Zigmond Disch [id number]  Disposition:  Gave clinical report to S. Jerelene Redden, NP, who determined that Pt meets inpatient criteria.  The patient demonstrates the following risk factors for suicide: Chronic risk factors for suicide include: psychiatric disorder of Schizophrenia . Acute risk factors for suicide include: unemployment. Protective factors for this patient include: positive social support. Considering these factors, the overall suicide risk at this point appears to be low. Patient is not appropriate for outpatient follow up.   [place] ED from 03/04/2021 in [place] No Risk       Chief Complaint:  Chief Complaint  Patient presents with   Schizophrenia    Per report, Pt has a dx of schizophrenia.  Friends brought him to the ED out of concern because he had not been eating, speaking, or communicating for several weeks.   Visit Diagnosis: Schizophrenia   Narrative:  Pt is a 35 year old male who presented to [facility] on 03/05/2021 at request of friends due to vegetative disturbance.  Per hospital note, "34 year old male the presents emerged from today with a friend of his secondary to psychiatric complaints.  Son that the patient has intermittent episodes of schizophrenia with negative symptoms.  He has been hospitalized multiple times in the past.  He will do okay for a year or 2 but then slowly get worse when he stops his medications.  Sounds like over the last few months he is progressively gotten worse with weight loss, decreased eating decreased activity, decreased interaction and just not acting himself so his friend brings him in here."  On admission, Pt's glucose level was 503.  It was reported that on 03/05/2021, Pt was found in his hospital room rubbing feces on his body.  Pt is now medically cleared, and he was assessed today with the aid of an Technical brewer.  Pt  reported that he is at the hospital because he has diabetes and he has not been feeling well.  Pt also stated that he has a diagnosis of Schizophrenia, but he does not take medication to manage it since symptoms abated several months ago.  Pt endorsed chronic fatigue.  He admitted that he had not been eating, and he attributed lack of appetite to diabetes.  Author observed Pt eat his lunch.  Pt denied despondency, suicidal ideation, homicidal ideation, hallucination, substance use concerns, and self-injurious behavior.  Pt stated that he is compliant with diabetes medication.  Pt asked to be discharged.    During assessment, Pt presented as alert and oriented.  He had fair eye contact.  Demeanor was uninterested.  Pt's mood was euthymic, and affect was flat.  Pt's speech seemed normal in rate, rhythm, and volume.  Thought processes were within normal range, and thought content was logical.  There was no evidence of delusion.  Memory and concentration were fair.  Insight, judgment, and impulse control were fair.   CCA Screening, Triage and Referral (STR)  Patient Reported Information How did you hear about [place]? Family/Friend  What Is the Reason for Your Visit/Call Today? Flattened affect, vegetative disturbance (not eating, losing weight, not communicating, not going to work).  Pt is diabetic.  Per Pt's report, he has Schizophrenia  How Long Has This Been Causing You Problems? 1 wk - 1 month  What Do You Feel Would Help You the Most Today? Medication(s)   Have You Recently Had Any Thoughts About Hurting Yourself? No  Are You Planning to Commit Suicide/Harm Yourself At This time? No   Have you Recently Had Thoughts About [place]? No  Are You Planning to Harm Someone at This Time? No  Explanation: No data recorded  Have You Used Any Alcohol or Drugs in the Past 24 Hours? No  How Long Ago Did You Use Drugs or Alcohol? No data recorded What Did You Use and How Much? No data  recorded  Do You Currently Have a Therapist/Psychiatrist? No  Name of Therapist/Psychiatrist: No data recorded  Have You Been Recently Discharged From Any Office Practice or Programs? No  Explanation of Discharge From Practice/Program: No data recorded    CCA Screening Triage Referral Assessment Type of Contact: Tele-Assessment  Telemedicine Service Delivery: Telemedicine service delivery: This service was provided via telemedicine using a 2-way, interactive audio and video technology  Is this Initial or Reassessment? Initial Assessment  Date Telepsych consult ordered in [facility]:  03/06/21  Time Telepsych consult ordered in [facility]:  No data recorded Location of Assessment: [facility]  Provider Location: [facility] Assessment Services   Collateral Involvement: No data recorded  Does Patient Have a [place]? No data recorded Name and Contact of Legal Guardian: No data recorded If Minor and Not Living with Parent(s), Who has Custody? No data recorded Is CPS involved or ever been involved? Never  Is APS involved or ever been involved? Never   Patient Determined To Be At Risk for Harm To Self or Others Based on Review of Patient Reported Information or Presenting Complaint? No  Method: No data recorded Availability of Means: No data recorded Intent: No data recorded Notification Required: No data recorded Additional Information for Danger to Others Potential: No data recorded Additional Comments for Danger to Others Potential: No data recorded Are There Guns or Other Weapons in Your Home? No data recorded Types of Guns/Weapons: No data recorded Are These Weapons Safely Secured?                            No data recorded Who Could Verify You Are Able To Have These Secured: No data recorded Do You Have any Outstanding Charges, Pending Court Dates, Parole/Probation? No data recorded Contacted To Inform of Risk of Harm To Self or Others: No data recorded   Does  Patient Present under Involuntary Commitment? No  IVC Papers Initial File Date: No data recorded  [place] of Residence: [place]   Patient Currently Receiving the Following Services: Not Receiving Services   Determination of Need: Urgent (48 hours)   Options For Referral: Outpatient Therapy; Medication Management     CCA Biopsychosocial Patient Reported Schizophrenia/Schizoaffective Diagnosis in Past: Yes   Strengths: Supportive friends   Mental Health Symptoms Depression:   Change in energy/activity   Duration of Depressive symptoms:  Duration of Depressive Symptoms: Greater than two weeks   Mania:   None   Anxiety:    None   Psychosis:   Affective flattening/alogia/avolition   Duration of Psychotic symptoms:  Duration of Psychotic Symptoms: Less than six months   Trauma:   None   Obsessions:   None   Compulsions:   None   Inattention:   None   Hyperactivity/Impulsivity:   None   Oppositional/Defiant Behaviors:   None   Emotional Irregularity:   None   Other Mood/Personality Symptoms:  No data recorded   Mental Status Exam Appearance and self-care  Stature:   Average  Weight:   Average weight   Clothing:   Casual   Grooming:   Normal   Cosmetic use:   None   Posture/gait:   Normal   Motor activity:   Not Remarkable   Sensorium  Attention:   Normal   Concentration:   Normal   Orientation:   X5   Recall/memory:   Normal   Affect and Mood  Affect:   Appropriate   Mood:   Euthymic   Relating  Eye contact:   Fleeting   Facial expression:   Responsive   Attitude toward examiner:   Passive   Thought and Language  Speech flow:  Clear and Coherent   Thought content:   Appropriate to Mood and Circumstances   Preoccupation:   None   Hallucinations:   None   Organization:  No data recorded  [employer] of Knowledge:   Average   Intelligence:   Average   Abstraction:    Functional   Judgement:   Poor   Reality Testing:   Adequate   Insight:   Poor   Decision Making:   Only simple   Social Functioning  Social Maturity:   Isolates   Social Judgement:   Normal   Stress  Stressors:   Other (Comment) (not compliant with medication)   Coping Ability:   Deficient supports   Skill Deficits:   Self-care   Supports:   Support needed     Religion:    Leisure/Recreation:    Exercise/Diet: Exercise/Diet Have You Gained or Lost A Significant Amount of Weight in the Past Six Months?: Yes-Lost (Per friend's report; Pt is unsure) Do You Follow a Special Diet?: No Do You Have Any Trouble Sleeping?: No   CCA Employment/Education Employment/Work Situation: Employment / Work Situation Employment Situation: Unemployed Patient's Job has Been Impacted by Current Illness: No Has Patient ever Been in Equities trader?: No  Education: Education Is Patient Currently Attending School?: No   CCA Family/Childhood History Family and Relationship History: Family history Does patient have children?: No  Childhood History:     Child/Adolescent Assessment:     CCA Substance Use Alcohol/Drug Use: Alcohol / Drug Use Pain Medications: see MAR Prescriptions: see MAR Over the Counter: see MAR History of alcohol / drug use?: No history of alcohol / drug abuse                         ASAM's:  Six Dimensions of Multidimensional Assessment  Dimension 1:  Acute Intoxication and/or Withdrawal Potential:      Dimension 2:  Biomedical Conditions and Complications:      Dimension 3:  Emotional, Behavioral, or Cognitive Conditions and Complications:     Dimension 4:  Readiness to Change:     Dimension 5:  Relapse, Continued use, or Continued Problem Potential:     Dimension 6:  Recovery/Living Environment:     ASAM Severity Score:    ASAM Recommended Level of Treatment:     Substance use Disorder (SUD)    Recommendations for  Services/Supports/Treatments:    Discharge Disposition:    DSM5 Diagnoses: Patient Active Problem List   Diagnosis Date Noted   Altered mental status    DKA (diabetic ketoacidosis) (HCC) 03/09/2020   Diabetes mellitus, new onset (HCC) 03/09/2020   Acute metabolic encephalopathy 03/09/2020   Elevated LFTs 03/09/2020   Major depressive disorder, recurrent severe without psychotic features ([facility]) 06/20/2017   MDD (major depressive disorder), recurrent,  severe, with psychosis ([place]) 06/19/2017     Referrals to Alternative Service(s): Referred to Alternative Service(s):   Place:   Date:   Time:    Referred to Alternative Service(s):   Place:   Date:   Time:    Referred to Alternative Service(s):   Place:   Date:   Time:    Referred to Alternative Service(s):   Place:   Date:   Time:     Marlowe Aschoff, [facility]

## 2021-03-06 NOTE — Discharge Instructions (Addendum)
For your behavioral health needs you are advised to follow up with [facility] [facility] at your earliest opportunity: °     [facility] °     [address] °     [place], [place] [address] °     [phone] °     They offer psychiatry/medication management and therapy.  New patients are seen in their walk-in clinic.  Walk-in hours are Monday - Thursday from 8:00 am - 11:00 am for psychiatry, and Friday from 1:00 pm - 4:00 pm for therapy.  Walk-in patients are seen on a first come, first served basis, so try to arrive as early as possible for the best chance of being seen the same day.  Please note that to be eligible for services you must bring an ID or a piece of mail with your name and a [place] address. ° °

## 2021-03-06 NOTE — ED Notes (Addendum)
After taking medications, patient got out of bed and started pacing in room with no pants on and diaper hanging off. Patient sat down on the floor and laid down to sleep. This RN asked if the patient needed anything. The patient denied any needs. Patient instructed to sleep in the bed and not on the floor. Patient stood up and started pacing in room again.This RN assisted patient with applying diaper and new pants provided to patient to wear. Patient applied pants and returned to bed. Will continue to monitor.

## 2021-03-06 NOTE — ED Notes (Signed)
TTS in progress 

## 2021-03-06 NOTE — ED Notes (Signed)
Patient resting in bed with eyes closed at this time.

## 2021-03-06 NOTE — ED Notes (Signed)
Pt given a bagged lunch.   

## 2021-03-06 NOTE — ED Notes (Addendum)
Patient toileted using the BSC. urine present.

## 2021-03-06 NOTE — ED Provider Notes (Signed)
Emergency Medicine Observation Re-evaluation Note  Garrett [name] is a 35 y.o. male, seen on rounds today.  Pt initially presented to the ED for complaints of Psychiatric Evaluation  (Glucose=503) Currently, the patient is resting comfortably in bed.  Physical Exam  BP 102/70 (BP Location: Right Arm)   Pulse 88   Temp 98 F (36.7 C) (Oral)   Resp 14   SpO2 99%  Physical Exam General: Thin male appears to be in no acute distress Cardiac: Regular rate and rhythm Lungs: Clear to auscultation Psych: Awakens easily and is interactive  ED Course / MDM  EKG:EKG Interpretation  Date/Time:  Friday March 04 2021 23:48:16 EST Ventricular Rate:  88 PR Interval:  144 QRS Duration: 78 QT Interval:  356 QTC Calculation: 430 R Axis:   73 Text Interpretation: Normal sinus rhythm Normal ECG When compared to prior, similar appearance. NO STEMI Confirmed by Theda Belfast ([id number]) on 03/05/2021 4:31:55 PM  I have reviewed the labs performed to date as well as medications administered while in observation.  Recent changes in the last 24 hours include patient has been eating and drinking with no diarrhea.  Plan  Current plan is for psych to evaluate.  Garrett [name] is not under involuntary commitment.     Margarita Grizzle, MD 03/06/21 1225

## 2021-03-06 NOTE — Progress Notes (Signed)
Inpatient Behavioral Health Placement  Pt meets inpatient criteria per Roselie Skinner, NP.  There are no appropriate beds at [facility] per [facility] Tommy Medal, RN. Referral was sent to the following facilities;   Destination Service Provider Address Phone Fax  [facility]  [address] [place] [address] [phone]  [facility]  [address] [place], [place] [address] [phone]  [facility]  [address] [place], [place] [address] [phone]  [facility]  [address] [place] [address] [phone]  [facility]  [phone] [address] [place] [address] [phone]  [facility]  [address] [place] [address] [phone]  [facility]  [address], [place] [address] [phone]  [facility]  [address], [place] [address] [phone]  [facility]  [address], [place] [address] [phone]  [facility]  [address] [place] [address] [phone]  [facility]  [address], [place] [address] [phone]  [facility]  [address] [place] [address] [phone]  [facility]  [address] [place], [place] [address] [phone]  [facility]  [address] [place] [address] [phone]  [facility] [address]  [address] [place] [address] [phone]     Situation ongoing,  CSW will follow up.   Maryjean Ka, MSW, LCSWA 03/06/2021  @ 11:17 PM

## 2021-03-06 NOTE — BH Assessment (Signed)
Requested cart. 

## 2021-03-07 ENCOUNTER — Encounter (HOSPITAL_COMMUNITY): Payer: Self-pay | Admitting: Registered Nurse

## 2021-03-07 DIAGNOSIS — F333 Major depressive disorder, recurrent, severe with psychotic symptoms: Secondary | ICD-10-CM

## 2021-03-07 NOTE — ED Notes (Signed)
Patient received evening snack and drink

## 2021-03-07 NOTE — Progress Notes (Signed)
Patient has been denied by [facility] due to no beds available. Patient has been faxed out and meets [facility] inpatient criteria per Ophelia Shoulder, NP. Patient has been faxed out to the following facilities:   [facility]  [address] [place] [address] [phone]  [facility]  [address] [place], [place] [address] [phone]  [facility]  [address] [place], [place] [address] [phone]  [facility]  [address] [place] [address] [phone]  [facility]  [phone] [address] [place] [address] [phone]  [facility]  [address] [place] [address] [phone]  [facility]  [address], [place] [address] [phone]  [facility]  [address] [place] [address] [phone]  [facility]  [address], [place] [address] [phone]  [facility]  [address] [place] [address] [phone]  [facility]  [address] [place] [address] [phone]  [facility]  [address], [place] [address] [phone]  [facility]  [address] [place], [place] [address] [phone]  [facility]  [address], [place] [address] [phone]  [facility] [address]  [address] [place] [address] [phone]   Damita Dunnings, MSW, LCSW-A  10:54 AM 03/07/2021

## 2021-03-07 NOTE — Consult Note (Signed)
Telepsych Consultation   Reason for Consult:  Altered mental status and depression Referring Physician:  Marily Memos, MD Location of Patient: [facility] ED Location of Provider: Other: [facility]  Patient Identification: Garrett [name] MRN:  [id number] Principal Diagnosis: MDD (major depressive disorder), recurrent, severe, with psychosis (HCC) Diagnosis:  Principal Problem:   MDD (major depressive disorder), recurrent, severe, with psychosis (HCC)   Total Time spent with patient: 30 minutes  Subjective:   Garrett [name] is a 35 y.o. male patient admitted to [facility] ED after presenting with complaints that patient hasn't been eating or taking his medications, isolation, and nonverbal.  HPI:  Garrett [name], 35 y.o., male patient seen via tele health by this provider, consulted with Dr. Nelly Rout; and chart reviewed on 03/07/21.  On evaluation Garrett [name] reports he feels fine.  Patient states he is hearing voices but doesn't know what the voices are saying.  He denies visual hallucinations.  Patient states he lives in [place] with friends and that he is able to perform his own ADLs without assistance.States he is eating and sleeping without any difficulty.  Patient denies suicidal/self-harm/homicidal ideation, and paranoia. Patient also states he has been taking medications while in hospital without any adverse reaction.  Patient unable to answer question as to why he was brought to the hospital.  He is aware of himself and where he is but not of current date.  Patient liner with information only giving short answers. Patient appears to understand conversation and is speaking in [place].  During evaluation Garrett [name] is laying in bed drinking from cup; he doesn't appear to be in any acute distress.  He is alert, oriented to self and place.  He is calm and cooperative.  His mood is depressed and congruent affect.  He does not appear to be responding to internal/external stimuli or delusional thoughts; However he continues  to endorse auditory hallucinations.  Patient denies suicidal/self-harm/homicidal ideation, and paranoia.    Spoke to patients' nurse  Orlie Pollen, RN who reports since she has had patient he has been doing fine.  States she hasn't noticed patient responding to internal/external stimuli.  Patient has been sleeping and has had no complaints or behavioral outburst.    Past Psychiatric History: Major depressive disorder recurrent sever with and without psychosis  Risk to Self:  Denies Risk to Others:  Denies Prior Inpatient Therapy:  Yes Prior Outpatient Therapy:  Denies  Past Medical History:  Past Medical History:  Diagnosis Date   Depression    History reviewed. No pertinent surgical history. Family History: History reviewed. No pertinent family history. Family Psychiatric  History: Unaware Social History:  Social History   Substance and Sexual Activity  Alcohol Use None     Social History   Substance and Sexual Activity  Drug Use Not on file    Social History   Socioeconomic History   Marital status: Single    Spouse name: Not on file   Number of children: Not on file   Years of education: Not on file   Highest education level: Not on file  Occupational History   Not on file  Tobacco Use   Smoking status: Never   Smokeless tobacco: Never  Substance and Sexual Activity   Alcohol use: Not on file   Drug use: Not on file   Sexual activity: Not on file  Other Topics Concern   Not on file  Social History Narrative   Not on file   Social Determinants of  Health   Financial Resource Strain: Not on file  Food Insecurity: Not on file  Transportation Needs: Not on file  Physical Activity: Not on file  Stress: Not on file  Social Connections: Not on file   Additional Social History:    Allergies:  No Known Allergies  Labs: No results found for this or any previous visit (from the past 48 hour(s)).  Medications:  Current Facility-Administered Medications   Medication Dose Route Frequency Provider Last Rate Last Admin   escitalopram (LEXAPRO) tablet 10 mg  10 mg Oral Daily Mesner, Jason, MD   10 mg at 03/07/21 6295   ibuprofen (ADVIL) tablet 400 mg  400 mg Oral Q8H PRN Mesner, Barbara Cower, MD       insulin glargine-yfgn (SEMGLEE) injection 40 Units  40 Units Subcutaneous Daily Mesner, Jason, MD   40 Units at 03/07/21 0911   loperamide (IMODIUM) capsule 4 mg  4 mg Oral PRN Tegeler, Canary Brim, MD   4 mg at 03/05/21 2121   LORazepam (ATIVAN) tablet 0.5 mg  0.5 mg Oral BID Mesner, Barbara Cower, MD   0.5 mg at 03/07/21 2841   metFORMIN (GLUCOPHAGE-XR) 24 hr tablet 1,000 mg  1,000 mg Oral Q breakfast Mesner, Barbara Cower, MD   1,000 mg at 03/07/21 3244   risperiDONE (RISPERDAL) tablet 2 mg  2 mg Oral BID Mesner, Barbara Cower, MD   2 mg at 03/07/21 0102   tamsulosin (FLOMAX) capsule 0.4 mg  0.4 mg Oral Daily Mesner, Barbara Cower, MD   0.4 mg at 03/07/21 7253   thiamine tablet 100 mg  100 mg Oral Daily Mesner, Jason, MD   100 mg at 03/07/21 0906   Current Outpatient Medications  Medication Sig Dispense Refill   escitalopram (LEXAPRO) 10 MG tablet Take 1 tablet (10 mg total) by mouth daily. For mood control 30 tablet 0   feeding supplement (ENSURE ENLIVE / ENSURE PLUS) LIQD Take 237 mLs by mouth 2 (two) times daily between meals. (Patient not taking: Reported on 07/01/2020) 237 mL 12   glucose blood (CONTOUR NEXT TEST) test strip Use to check blood sugar up to TID. 100 each 2   ibuprofen (ADVIL) 400 MG tablet Take 1 tablet (400 mg total) by mouth every 8 (eight) hours as needed for mild pain or moderate pain. (Patient not taking: Reported on 07/01/2020) 30 tablet 0   Insulin Glargine (BASAGLAR KWIKPEN) 100 UNIT/ML Inject 40 Units into the skin daily. 15 mL 2   LORazepam (ATIVAN) 0.5 MG tablet Take 1 tablet (0.5 mg total) by mouth 2 (two) times daily. For anxiety (Patient not taking: No sig reported) 14 tablet 0   metFORMIN (GLUCOPHAGE-XR) 500 MG 24 hr tablet Take 2 tablets (1,000 mg  total) by mouth daily with breakfast. After 1 week, increase to 4 tablets by mouth daily if tolerable. 120 tablet 2   risperiDONE (RISPERDAL) 2 MG tablet Take 1 tablet (2 mg total) by mouth 2 (two) times daily. For mood control 60 tablet 0   tamsulosin (FLOMAX) 0.4 MG CAPS capsule Take 1 capsule (0.4 mg total) by mouth daily. 30 capsule 3   thiamine 100 MG tablet Take 1 tablet (100 mg total) by mouth daily.      Musculoskeletal: Strength & Muscle Tone: within normal limits Gait & Station:  Did not see patient ambulate.  Reports he is able to ambulate without assistance Patient leans: N/A          Psychiatric Specialty Exam:  Presentation  General Appearance: Appropriate for Environment  Eye Contact:Good  Speech:Clear and Coherent; Normal Rate  Speech Volume:Normal  Handedness:Right   Mood and Affect  Mood:Depressed  Affect:Congruent   Thought Process  Thought Processes:Coherent; Linear  Descriptions of Associations:Intact  Orientation:Partial (person and place)  Thought Content:Logical  History of Schizophrenia/Schizoaffective disorder:Yes  Duration of Psychotic Symptoms:Less than six months  Hallucinations:Hallucinations: Auditory Description of Auditory Hallucinations: Reports he is hearing voices but states he doesn't know what the voices are saying  Ideas of Reference:None  Suicidal Thoughts:Suicidal Thoughts: No  Homicidal Thoughts:Homicidal Thoughts: No   Sensorium  Memory:Immediate Good; Recent Good  Judgment:Intact  Insight:Present   Executive Functions  Concentration:Good  Attention Span:Good  [place]   Psychomotor Activity  Psychomotor Activity:Psychomotor Activity: Normal   Assets  Assets:Communication Skills; Desire for Improvement; Housing   Sleep  Sleep:Sleep: Good    Physical Exam: Physical Exam Vitals and nursing note reviewed. Exam conducted with a chaperone  present.  Constitutional:      General: He is not in acute distress.    Appearance: Normal appearance. He is not ill-appearing.  Cardiovascular:     Rate and Rhythm: Normal rate.     Comments: Hypotensive  Pulmonary:     Effort: Pulmonary effort is normal.  Neurological:     Mental Status: He is alert and oriented to person, place, and time.   Review of Systems  Constitutional:  Negative for chills and fever.  Respiratory:  Negative for cough and shortness of breath.   Cardiovascular:  Negative for chest pain.  Gastrointestinal: Negative.  Negative for vomiting.  Musculoskeletal: Negative.   Psychiatric/Behavioral:  Positive for depression. Hallucinations: States he is hearing voices but doesn't know what the voices are saying. Substance abuse: Denies. Suicidal ideas: Denies.The patient is nervous/anxious. The patient does not have insomnia.        Patient denies visual hallucinations, suicidal and homicidal ideation, and paranoia  Blood pressure (!) 88/55, pulse 92, temperature 98 F (36.7 C), temperature source Oral, resp. rate 20, SpO2 100 %. There is no height or weight on file to calculate BMI.  Treatment Plan Summary: Daily contact with patient to assess and evaluate symptoms and progress in treatment, Medication management, and Plan Inpatient psychiatric treatment  Disposition: Recommend psychiatric Inpatient admission when medically cleared.  This service was provided via telemedicine using a 2-way, interactive audio and video technology.  Names of all persons participating in this telemedicine service and their role in this encounter. Name: Earleen Newport Role: NP  Name: Dr. Hampton Abbot Role: Psychiatrist  Name: Garrett [name] Role: Patient  Name:  Role:    Secure Message sent to patients' nurse Donah Driver, RN informing:  Psychiatric consult complete.  Patient continues meet criteria for inpatient psychiatric treatment.  History of MDD with psychosis.  Doing better and  participated in assessment; speaking English and able to answer questions but still liner with response and continues to endorse auditory hallucinations.  Please inform MD only default listed.  If no available bed at [facility] social work to fax to surrounding facilities for appropriate bed.     Luevenia Mcavoy, NP 03/07/2021 10:57 AM

## 2021-03-07 NOTE — ED Notes (Signed)
Breakfast orders placed 

## 2021-03-07 NOTE — ED Notes (Signed)
Patient up eating lunch.

## 2021-03-08 LAB — CBG MONITORING, ED: Glucose-Capillary: 276 mg/dL — ABNORMAL HIGH (ref 70–99)

## 2021-03-08 NOTE — Progress Notes (Signed)
Patient has been denied by [facility] due to no beds available. Patient meets [facility] inpatient criteria per Ophelia Shoulder, NP. Patient has been faxed out to the following facilities:   [facility]  [address] [place] [address] [phone] --  [facility]  [address] [place], [place] [address] [phone] --  [facility]  [address] [place], [place] [address] [phone] --  [facility]  [address] [place] [address] [phone] --  [facility]  [phone] [address] [place] [address] [phone] --  [facility]  [address] [place] [address] [phone] --  [facility]  [address] [place] [address] [phone] --  [facility]  [address] [place] [address] [phone] --  [facility]  [address], [place] [address] [phone] --  [facility]  [address] [place] [address] [phone] --  [facility]  [address] [place] [address] [phone] --  [facility]  [address], [place] [address] [phone] --  [facility]  [address] [place], [place] [address] [phone] --  [facility]  [address], [place] [address] [phone] --  [facility] [address]  [address] [place] [address] [phone] --   Damita Dunnings, MSW, LCSW-A  10:55 AM 03/08/2021

## 2021-03-08 NOTE — ED Provider Notes (Signed)
Emergency Medicine Observation Re-evaluation Note  Garrett [name] is a 35 y.o. male, seen on rounds today.  Pt initially presented to the ED for complaints of Schizophrenia (Per report, Pt has a dx of schizophrenia.  Friends brought him to the ED out of concern because he had not been eating, speaking, or communicating for several weeks.) Currently, the patient is resting  Physical Exam  BP 91/66   Pulse (!) 107   Temp 98 F (36.7 C) (Oral)   Resp 16   SpO2 99%  Physical Exam General: NAD Cardiac: Regular rate Lungs: No respiratory distress Psych: Calm  ED Course / MDM  EKG:EKG Interpretation  Date/Time:  Friday March 04 2021 23:48:16 EST Ventricular Rate:  88 PR Interval:  144 QRS Duration: 78 QT Interval:  356 QTC Calculation: 430 R Axis:   73 Text Interpretation: Normal sinus rhythm Normal ECG When compared to prior, similar appearance. NO STEMI Confirmed by Theda Belfast ([id number]) on 03/05/2021 4:31:55 PM  I have reviewed the labs performed to date as well as medications administered while in observation.   Plan  Current plan is for inpatient psychiatry placement. BS still high, improving from intake.  Patient now on insulin.  Garrett [name] is not under involuntary commitment.   Garrett Sleeper, MD 03/08/21 [phone]

## 2021-03-08 NOTE — ED Notes (Addendum)
The patient ate 100 percent of his breakfast

## 2021-03-08 NOTE — ED Notes (Signed)
Pt given crackers and water per request. No acute changes noted. Will continue to monitor.

## 2021-03-09 ENCOUNTER — Encounter (HOSPITAL_COMMUNITY): Payer: Self-pay | Admitting: Registered Nurse

## 2021-03-09 DIAGNOSIS — F333 Major depressive disorder, recurrent, severe with psychotic symptoms: Secondary | ICD-10-CM | POA: Diagnosis not present

## 2021-03-09 DIAGNOSIS — F209 Schizophrenia, unspecified: Secondary | ICD-10-CM | POA: Diagnosis not present

## 2021-03-09 MED ORDER — LORAZEPAM 0.5 MG PO TABS
0.5000 mg | ORAL_TABLET | Freq: Every day | ORAL | Status: DC
  Administered 2021-03-10 – 2021-07-11 (×124): 0.5 mg via ORAL
  Filled 2021-03-09 (×124): qty 1

## 2021-03-09 MED ORDER — BENZTROPINE MESYLATE 1 MG PO TABS
0.5000 mg | ORAL_TABLET | Freq: Two times a day (BID) | ORAL | Status: DC
  Administered 2021-03-09 – 2021-05-04 (×114): 0.5 mg via ORAL
  Filled 2021-03-09 (×114): qty 1

## 2021-03-09 NOTE — ED Notes (Signed)
Pt up with assistance to RR.

## 2021-03-09 NOTE — Care Management (Signed)
[facility] ED RNCM forwarded message to the PCP Care Management team to follow up  with patient post ED discharge.

## 2021-03-09 NOTE — Consult Note (Signed)
Telepsych Consultation   Reason for Consult:  Altered mental status and depression Referring Physician:  Merrily Pew, MD Location of Patient: [facility] Location of Provider: Other: [facility]  Patient Identification: Garrett [name] MRN:  [id number] Principal Diagnosis: MDD (major depressive disorder), recurrent, severe, with psychosis ([place]) Diagnosis:  Principal Problem:   MDD (major depressive disorder), recurrent, severe, with psychosis ([place])   Total Time spent with patient: 30 minutes  Subjective:   Garrett [name] is a 35 y.o. male patient admitted to [facility] ED after presenting with complaints that patient hasn't been eating or taking his medications, isolation, and nonverbal.  HPI:  Garrett [name], 35 y.o., male patient seen via tele health for psychiatric reassessment by this provider, consulted with Dr. Hampton Abbot; and chart reviewed on 03/09/21.  On evaluation Hansen Arons reports he feels good.  Patient stating he was brought to the hospital because of his diabetes.  Reports he does have a history of mental health diagnosis but unsure or what diagnosis is "I'm not really sure. I don't really know to much; but I do have thoughts."  Stating he has had thoughts of suicidal ideation at times but denies currently.  Patient denies suicidal/self-harm/homicidal ideation, psychosis, and paranoia.  Patient states he lives with a friend but he is able to care for himself (performs his own ADLs).  Patient states he doesn't current have outpatient psychiatric services.  Patient reports he is tolerating medications without adverse reaction and sleeping without difficulty.  Patient states he has also been eating without difficulty.  During evaluation Garrett [name] is laying in bed in any acute distress.  He is alert, oriented to x 4.  Patient was able to give the correct answers to questions: Year, Month, Place, [place], [place], [place],  and Age.  He is calm and cooperative.  His mood is depressed and congruent affect.  He does not appear  to be responding to internal/external stimuli or delusional thoughts; he denies suicidal/self-harm/homicidal ideation, psychosis, and paranoia.  Patient answered questions appropriately.  Patient gave permission to speak to his friend listed on his emergency contact list Garrett [name] at [place]  Collateral Information:   Spoke to patients' nurse Martinique Boney, RN who reports she hasn't notice patient responding to internal/externa stimuli.  States he isn't talking much only shaking head yes or no.  States patient has denied suicidal/homicidal ideation, and psychosis.    Gardiner Fanti Bu Paung at [phone] (friend):  Out of town unsure when will return  Spoke with Win Chow [phone]:  States that she is no longer the care giver for [facility] and that he lives with Garrett [name] who is out of town, and she doesn't know when he is supposed to be back.  States that patient is unable to cook for himself and not sure if he is able to stay alone in home without any assistance.  States she will inform him that patient has been psychiatrically cleared and will not need to be admitted to psychiatric hospital.    Past Psychiatric History: Major depressive disorder recurrent sever with and without psychosis  Risk to Self:  Denies Risk to Others:  Denies Prior Inpatient Therapy:  Yes Prior Outpatient Therapy:  Denies  Past Medical History:  Past Medical History:  Diagnosis Date   Depression    History reviewed. No pertinent surgical history. Family History: History reviewed. No pertinent family history. Family Psychiatric  History: Unaware Social History:  Social History   Substance and  Sexual Activity  Alcohol Use None     Social History   Substance and Sexual Activity  Drug Use Not on file    Social History   Socioeconomic History   Marital status: Single    Spouse name: Not on file   Number of children: Not on file   Years of education: Not on file   Highest  education level: Not on file  Occupational History   Not on file  Tobacco Use   Smoking status: Never   Smokeless tobacco: Never  Substance and Sexual Activity   Alcohol use: Not on file   Drug use: Not on file   Sexual activity: Not on file  Other Topics Concern   Not on file  Social History Narrative   Not on file   Social Determinants of Health   Financial Resource Strain: Not on file  Food Insecurity: Not on file  Transportation Needs: Not on file  Physical Activity: Not on file  Stress: Not on file  Social Connections: Not on file   Additional Social History:    Allergies:  No Known Allergies  Labs:  Results for orders placed or performed during the hospital encounter of 03/04/21 (from the past 48 hour(s))  CBG monitoring, ED     Status: Abnormal   Collection Time: 03/08/21  8:51 AM  Result Value Ref Range   Glucose-Capillary 276 (H) 70 - 99 mg/dL    Comment: Glucose reference range applies only to samples taken after fasting for at least 8 hours.   Comment 1 Notify RN    Comment 2 Document in Chart     Medications:  Current Facility-Administered Medications  Medication Dose Route Frequency Provider Last Rate Last Admin   benztropine (COGENTIN) tablet 0.5 mg  0.5 mg Oral BID Lynne Takemoto B, NP   0.5 mg at 03/09/21 1043   escitalopram (LEXAPRO) tablet 10 mg  10 mg Oral Daily Mesner, Jason, MD   10 mg at 03/09/21 0946   ibuprofen (ADVIL) tablet 400 mg  400 mg Oral Q8H PRN Mesner, Corene Cornea, MD       insulin glargine-yfgn (SEMGLEE) injection 40 Units  40 Units Subcutaneous Daily Mesner, Jason, MD   40 Units at 03/09/21 0946   loperamide (IMODIUM) capsule 4 mg  4 mg Oral PRN Tegeler, Gwenyth Allegra, MD   4 mg at 03/05/21 2121   LORazepam (ATIVAN) tablet 0.5 mg  0.5 mg Oral BID Mesner, Corene Cornea, MD   0.5 mg at 03/09/21 0946   metFORMIN (GLUCOPHAGE-XR) 24 hr tablet 1,000 mg  1,000 mg Oral Q breakfast Mesner, Corene Cornea, MD   1,000 mg at 03/09/21 0809   risperiDONE (RISPERDAL)  tablet 2 mg  2 mg Oral BID Mesner, Corene Cornea, MD   2 mg at 03/09/21 0946   tamsulosin (FLOMAX) capsule 0.4 mg  0.4 mg Oral Daily Mesner, Corene Cornea, MD   0.4 mg at 03/09/21 0945   thiamine tablet 100 mg  100 mg Oral Daily Mesner, Jason, MD   100 mg at 03/09/21 0945   Current Outpatient Medications  Medication Sig Dispense Refill   escitalopram (LEXAPRO) 10 MG tablet Take 1 tablet (10 mg total) by mouth daily. For mood control 30 tablet 0   feeding supplement (ENSURE ENLIVE / ENSURE PLUS) LIQD Take 237 mLs by mouth 2 (two) times daily between meals. (Patient not taking: Reported on 07/01/2020) 237 mL 12   glucose blood (CONTOUR NEXT TEST) test strip Use to check blood sugar up to TID. 100  each 2   ibuprofen (ADVIL) 400 MG tablet Take 1 tablet (400 mg total) by mouth every 8 (eight) hours as needed for mild pain or moderate pain. (Patient not taking: Reported on 07/01/2020) 30 tablet 0   Insulin Glargine (BASAGLAR KWIKPEN) 100 UNIT/ML Inject 40 Units into the skin daily. 15 mL 2   LORazepam (ATIVAN) 0.5 MG tablet Take 1 tablet (0.5 mg total) by mouth 2 (two) times daily. For anxiety (Patient not taking: No sig reported) 14 tablet 0   metFORMIN (GLUCOPHAGE-XR) 500 MG 24 hr tablet Take 2 tablets (1,000 mg total) by mouth daily with breakfast. After 1 week, increase to 4 tablets by mouth daily if tolerable. 120 tablet 2   risperiDONE (RISPERDAL) 2 MG tablet Take 1 tablet (2 mg total) by mouth 2 (two) times daily. For mood control 60 tablet 0   tamsulosin (FLOMAX) 0.4 MG CAPS capsule Take 1 capsule (0.4 mg total) by mouth daily. 30 capsule 3   thiamine 100 MG tablet Take 1 tablet (100 mg total) by mouth daily.      Musculoskeletal: Strength & Muscle Tone: within normal limits Gait & Station:  Did not see patient ambulate.  Reports he is able to ambulate without assistance Patient leans: N/A  Psychiatric Specialty Exam:  Presentation  General Appearance: Appropriate for Environment  Eye  Contact:Good  Speech:Clear and Coherent; Normal Rate  Speech Volume:Decreased  Handedness:Right   Mood and Affect  Mood:Depressed  Affect:Congruent   Thought Process  Thought Processes:Coherent; Goal Directed  Descriptions of Associations:Intact  Orientation:Full (Time, Place and Person)  Thought Content:Logical  History of Schizophrenia/Schizoaffective disorder:Yes  Duration of Psychotic Symptoms:Less than six months  Hallucinations:Hallucinations: None  Ideas of Reference:None  Suicidal Thoughts:Suicidal Thoughts: No  Homicidal Thoughts:Homicidal Thoughts: No   Sensorium  Memory:Immediate Good; Recent Good  Judgment:Intact  Insight:Fair; Present   Executive Functions  Concentration:Good  Attention Span:Good  Recall:Good  Fund of Knowledge:Fair  Language:Fair (Speaks very little English.  Response better when use interpretor during assessment.  Speaks [place])   Psychomotor Activity  Psychomotor Activity:Psychomotor Activity: Normal   Assets  Assets:Communication Skills; Desire for Improvement; Housing; Social Support   Sleep  Sleep:Sleep: Good    Physical Exam: Physical Exam Vitals and nursing note reviewed. Exam conducted with a chaperone present.  Constitutional:      General: He is not in acute distress.    Appearance: Normal appearance. He is not ill-appearing.  Cardiovascular:     Rate and Rhythm: Normal rate.     Comments: Hypotensive  Pulmonary:     Effort: Pulmonary effort is normal.  Neurological:     Mental Status: He is alert and oriented to person, place, and time.  Psychiatric:        Attention and Perception: Attention and perception normal. He does not perceive auditory or visual hallucinations.        Mood and Affect: Affect normal. Mood is depressed.        Speech: Speech normal.        Behavior: Behavior normal. Behavior is cooperative.        Thought Content: Thought content normal. Thought content is not  paranoid or delusional. Thought content does not include homicidal or suicidal ideation.        Cognition and Memory: Cognition normal.        Judgment: Judgment normal.   Review of Systems  Constitutional:  Negative for chills and fever.  Respiratory:  Negative for cough and shortness of breath.  Cardiovascular:  Negative for chest pain.  Gastrointestinal: Negative.  Negative for vomiting.  Musculoskeletal: Negative.   Psychiatric/Behavioral:  Positive for depression (Stable). Hallucinations: Denies hearing voices at this time. Substance abuse: Denies. Suicidal ideas: Denies.The patient does not have insomnia. Nervous/anxious: Stable.       Patient denies visual hallucinations, suicidal and homicidal ideation, and paranoia  Blood pressure (!) 89/57, pulse 81, temperature 98 F (36.7 C), temperature source Oral, resp. rate 20, SpO2 98 %. There is no height or weight on file to calculate BMI.  Treatment Plan Summary: Plan Psychiatrically clear to follow up with outpatient psychiatric services.    Patient denies suicidal/self-harm/homicidal ideation, psychosis, and paranoia.  Patient eating and sleeping without difficulty and tolerating medications without adverse reaction.  Patient will need prescriptions for medications and set up with out patient psychiatric services.  Patients care giver is on vacation and no one knows when he will return patient unable to stay at home on his own.  Patient not imminent risk to self or others and doesn't require psychiatric hospitalization.   Disposition: No evidence of imminent risk to self or others at present.   Patient does not meet criteria for psychiatric inpatient admission. Supportive therapy provided about ongoing stressors. Discussed crisis plan, support from social network, calling 911, coming to the Emergency Department, and calling Suicide Hotline.  This service was provided via telemedicine using a 2-way, interactive audio and video  technology.  Names of all persons participating in this telemedicine service and their role in this encounter. Name: Earleen Newport Role: NP  Name: Dr. Hampton Abbot Role: Psychiatrist  Name: [name] Role: Patient  Name:  Role:    Secure Message sent to patients' nurse Martinique Boney, RN informing:  Psychiatric reassessment completed and patient psychiatrically cleared.   Will order social work/TOC consult related to patient care giver being out of town and not sure when coming back.  Patient unable to care for himself at home alone.  Patient is able to communicate an talk better when interpreter used speaks [place]; understands very little [place].  Once patient is discharged, he will need prescriptions for psychotropic medications (Cogentin, Lexapro, Risperdal, and Trazodone).  Ativan decreased to 0.5 mg Q HS; may want to discontinue; wasn't taking prior to ED visit.  Please inform MD only default listed.     Langley Flatley, NP 03/09/2021 1:40 PM

## 2021-03-09 NOTE — BH Assessment (Signed)
[facility] Assessment Progress Note   Per Shuvon Rankin, NP, this pt does not require psychiatric hospitalization at this time.  Pt is psychiatrically cleared.  Discharge instructions include referral information for [facility].  Pt's nurse, [place], has been notified.  Doylene Canning, MA Triage Specialist [phone]

## 2021-03-09 NOTE — ED Notes (Signed)
Breakfast orders placed 

## 2021-03-09 NOTE — ED Notes (Signed)
Pt showering at this time. Lines Changed by sitter.

## 2021-03-09 NOTE — Progress Notes (Signed)
Per Shuvon Rankin, NP, this pt does not require psychiatric hospitalization at this time. This CSW will now remove pt from [facility] shift report and TOC team will follow-up and assist with pt's discharge needs.   Maryjean Ka, MSW, [facility] 03/09/2021 10:28 PM

## 2021-03-09 NOTE — ED Provider Notes (Signed)
Emergency Medicine Observation Re-evaluation Note  Garrett [name] is a 34 y.o. male, seen on rounds today.  Pt initially presented to the ED for complaints of Schizophrenia (Per report, Pt has a dx of schizophrenia.  Friends brought him to the ED out of concern because he had not been eating, speaking, or communicating for several weeks.) Currently, the patient is resting.  Physical Exam  BP (!) 89/57 (BP Location: Right Arm)   Pulse 81   Temp 98 F (36.7 C) (Oral)   Resp 20   SpO2 98%  Physical Exam General: NAD Lungs: normal work of breathing, equal chest rise Psych: resting, calm  ED Course / MDM  EKG:EKG Interpretation  Date/Time:  Friday March 04 2021 23:48:16 EST Ventricular Rate:  88 PR Interval:  144 QRS Duration: 78 QT Interval:  356 QTC Calculation: 430 R Axis:   73 Text Interpretation: Normal sinus rhythm Normal ECG When compared to prior, similar appearance. NO STEMI Confirmed by Theda Belfast ([id number]) on 03/05/2021 4:31:55 PM  I have reviewed the labs performed to date as well as medications administered while in observation.  Recent changes in the last 24 hours include patient was able to shower and eat breakfast. BG fluctuating but improved overall. UA w/o ketones on arrival.   Plan  Current plan is for placement.  Garrett [name] is not under involuntary commitment.     Sloan Leiter, DO 03/09/21 [phone]

## 2021-03-09 NOTE — ED Notes (Signed)
Psych NP doing TTS at this time 

## 2021-03-09 NOTE — ED Notes (Signed)
Spoke with Psych NP. States that she plans on clearing the pt from psych standpoint. Reports that she will attempt calling emergency contact to discuss tx plan.

## 2021-03-09 NOTE — Progress Notes (Signed)
03/08/21 @ 4:53pm CSW received a phone call the [facility] [place] denied pt. CSW and Disposition team will continue to assist with inpatient behavioral health placement.    Maryjean Ka, MSW, LCSWA 03/09/2021 12:26 AM

## 2021-03-09 NOTE — ED Notes (Signed)
Social Worker speaking with pt at this time

## 2021-03-09 NOTE — ED Notes (Signed)
Pt incont BM at this time. Pt given a shower and linens changed

## 2021-03-09 NOTE — Social Work (Signed)
CSW met with Pt at bedside with the use of [place] interpreter [phone].  Pt is a 35 y/o male who appears older than stated age who remained mostly self-absorbed during interview and presented with a flat affect.   Pt is A&Ox4 answering all orientation questions quickly and correctly. Pt reports that he has 2 roommates, Da Bu and [employer]. When asked the whereabouts of his roommates, Pt reports that they are at home.  Pt states that he does not work and does not remember when he last worked. Pt reports that he does not pay rent due to not working. Pt reports that roommates allow him to stay without paying rent.  Pt states he does not pay any bills because he does not have any money.  Pt states that he does not cook but that he does microwave. Pt reports that his roommates give him food. Pt states that he usde to get food stamps, but, "that was before" Pt reports that he does not grocery shop because he does not know how to get there. States that he used to ride bus but "that was before, not any more".    Pt reports that he used to be involved in church activities but again states, "that was before, a long time ago".  CSW asked Pt if he showers himself and Pt responded Yes.

## 2021-03-09 NOTE — Progress Notes (Signed)
CSW received call from [facility] Officer Suzie Portela in response to a welfare check to the address listed in chart. Per officer Suzie Portela there is an older man and woman at the residence who do not speak English so communication was difficult.   Officer Suzie Portela did ask about the name in the chart, Garrett [name] and was informed that Garrett [name] is currently in [place].

## 2021-03-10 LAB — CBG MONITORING, ED: Glucose-Capillary: 303 mg/dL — ABNORMAL HIGH (ref 70–99)

## 2021-03-10 NOTE — ED Provider Notes (Signed)
Emergency Medicine Observation Re-evaluation Note  Garrett [name] is a 34 y.o. male, seen on rounds today.  Pt initially presented to the ED for complaints of Schizophrenia (Per report, Pt has a dx of schizophrenia.  Friends brought him to the ED out of concern because he had not been eating, speaking, or communicating for several weeks.) Currently, the patient is resting.  Physical Exam  BP 95/69   Pulse 95   Temp 98.2 F (36.8 C) (Oral)   Resp 20   SpO2 96%  Physical Exam  General: calm Cardiac: warm and well perfused Lungs: even, unlabored Psych: calm  ED Course / MDM  EKG:EKG Interpretation  Date/Time:  Friday March 04 2021 23:48:16 EST Ventricular Rate:  88 PR Interval:  144 QRS Duration: 78 QT Interval:  356 QTC Calculation: 430 R Axis:   73 Text Interpretation: Normal sinus rhythm Normal ECG When compared to prior, similar appearance. NO STEMI Confirmed by Theda Belfast ([id number]) on 03/05/2021 4:31:55 PM  I have reviewed the labs performed to date as well as medications administered while in observation.  Recent changes in the last 24 hours include psych cleared, TOC asked to help with discharge planning.  Plan  Current plan is for discharge planning per TOC.  Garrett [name] is not under involuntary commitment.     Garrett Loll, MD 03/10/21 [phone]

## 2021-03-10 NOTE — Progress Notes (Signed)
CSW made contact with Dah Bu, former caregiver, via phone. Per Ward Givens, he is in [place] and is not expected to return until late January. CSW asked about Pt being able to return to residence to stay under the care of Dah Bu's parents, but Joylene John Bu states that his parents are not able to care for Pt because they are elderly and not able to administer medication nor to provide personal care for Pt.  CSW called [facility] to make report. Gave report to Darryl Lent of APS.  TOC leadership updated.

## 2021-03-10 NOTE — Progress Notes (Signed)
CSW followed-up with non emergency and they stated the officer has made several attempts today and no one is coming to the door or no one is home. CSW will have second shift CSW attempt in the evening.

## 2021-03-10 NOTE — Progress Notes (Signed)
Non emergency was contacted and CSW requested the police to contact CSW when they get to the home.

## 2021-03-11 NOTE — ED Notes (Signed)
Walking PT to the bathroom , PT appeared to buckle at the knees to give the appearance of falling , PT did not fall.

## 2021-03-11 NOTE — ED Provider Notes (Signed)
Emergency Medicine Observation Re-evaluation Note  Garrett [name] is a 35 y.o. male, seen on rounds today.  Pt initially presented to the ED for complaints of Schizophrenia (Per report, Pt has a dx of schizophrenia.  Friends brought him to the ED out of concern because he had not been eating, speaking, or communicating for several weeks.) Currently, the patient is sleeping in bed.  Physical Exam  BP 94/64 (BP Location: Right Arm)   Pulse 87   Temp 98.1 F (36.7 C) (Oral)   Resp 15   SpO2 97%  Physical Exam General: Sleeping in bed, calm Cardiac: RRR, warm well perfused Lungs: CTAB Psych: Sleeping  ED Course / MDM  EKG:EKG Interpretation  Date/Time:  Friday March 04 2021 23:48:16 EST Ventricular Rate:  88 PR Interval:  144 QRS Duration: 78 QT Interval:  356 QTC Calculation: 430 R Axis:   73 Text Interpretation: Normal sinus rhythm Normal ECG When compared to prior, similar appearance. NO STEMI Confirmed by Theda Belfast ([id number]) on 03/05/2021 4:31:55 PM  I have reviewed the labs performed to date as well as medications administered while in observation.  Recent changes in the last 24 hours include awaiting adult protective services consult.  Plan  Current plan is for APS.  Garrett [name] is not under involuntary commitment.     Al Decant, PA-C 03/11/21 1223    Terald Sleeper, MD 03/11/21 1705

## 2021-03-11 NOTE — Progress Notes (Signed)
APS report accepted.

## 2021-03-11 NOTE — ED Notes (Addendum)
Pt walked to the restroom with Dwana Curd, ED tech. Pt still very unsteady on his feet.  Pt urinated in the toilet at this time.

## 2021-03-11 NOTE — ED Notes (Signed)
Interpreter used to speak with pt at this time. Pt is AxOx4. He denies SI/HI, hallucinations, or delusions. Pt denies any needs at this time.

## 2021-03-11 NOTE — ED Notes (Signed)
Pt resting in room, eating dinner without assistance.

## 2021-03-11 NOTE — ED Notes (Signed)
Pt bed found to be soiled. Pt led to shower by staff, movements notably slow, but pt follows instruction without expression. Pt showered with staff instructions and bedding changed. Pt now returned to room.

## 2021-03-11 NOTE — ED Notes (Signed)
[place] Child psychotherapist speaking with pt at this time.

## 2021-03-11 NOTE — Progress Notes (Signed)
CSW called [facility] [employer] left message requesting call back.  CSW called [employer] left message requesting call back.

## 2021-03-12 LAB — CBG MONITORING, ED: Glucose-Capillary: 133 mg/dL — ABNORMAL HIGH (ref 70–99)

## 2021-03-12 NOTE — ED Notes (Signed)
Pt breakfast tray delivered at this time. Pt resting.

## 2021-03-12 NOTE — ED Notes (Signed)
Patient taken to the restroom and encouraged to wash up.

## 2021-03-13 LAB — CBG MONITORING, ED: Glucose-Capillary: 159 mg/dL — ABNORMAL HIGH (ref 70–99)

## 2021-03-13 NOTE — ED Notes (Signed)
Pt brief checked with another episode of incontinence - pt changed into clean brief

## 2021-03-13 NOTE — ED Notes (Signed)
Pt needs reminders for toileting Pt found to have had BM on self - pt taken to shower and showered off with this RN and sitter - pt unsteady on feet  Scrubs changed and brief placed on patient

## 2021-03-13 NOTE — ED Notes (Signed)
Pt up to bathroom with no difficulty.  

## 2021-03-14 LAB — CBG MONITORING, ED: Glucose-Capillary: 264 mg/dL — ABNORMAL HIGH (ref 70–99)

## 2021-03-14 NOTE — ED Notes (Signed)
Pt noted to have extremely unsteady gait when attempting to ambulate to restroom and attempted to sit down on the floor.  Pt assisted to bedside commode by this Clinical research associate and 2 sitters.   Pt was able to ambulate to restroom x 2 w/ sitters standby assist earlier.

## 2021-03-14 NOTE — ED Notes (Signed)
Breakfast order placed ?

## 2021-03-14 NOTE — Progress Notes (Signed)
CSW contacted APS to get an update on patient.

## 2021-03-14 NOTE — ED Notes (Signed)
Pt calm and cooperative.  Able to verbalize birthday prior to med pass.  Pt noted to ambulate to restroom w/ sitter's assistance earlier.

## 2021-03-14 NOTE — ED Notes (Signed)
Bedside table placed next to bed and drinks place within arms length.  Pt remains calm and cooperative.

## 2021-03-14 NOTE — ED Notes (Signed)
Pt in bed at this time. Pt has been up and down to restroom with assistance of sitter. Medication administration completed without issue. Sitter at bedside.

## 2021-03-15 LAB — CBG MONITORING, ED: Glucose-Capillary: 194 mg/dL — ABNORMAL HIGH (ref 70–99)

## 2021-03-15 NOTE — ED Notes (Signed)
Pt has had multiple loose stools, PRN imodium to be given

## 2021-03-15 NOTE — ED Notes (Signed)
Pt resting in room, sitter present

## 2021-03-16 NOTE — ED Notes (Signed)
Patient able to take shower. Patient calm and cooperative at this time

## 2021-03-16 NOTE — Progress Notes (Signed)
CSW received update from [facility] APS Case worker Ignacia Bayley who states that she has been unable to reach [employer]. Ms. Kyla Balzarine also states that at this time case has yet to be staffed with supervisor due to scheduling difficulties.   CSW informed Ms. Kyla Balzarine that this CSW is in possession of Pt's important documents such as Magazine features editor, Herbalist.

## 2021-03-16 NOTE — ED Notes (Signed)
Pt attempted to ambulate to the restroom with tech. While standing at the counter waiting to use the restroom pt started squatting and bending over. Pt was escorted back to bed at this time with this RN and the tech that is sitting with the pt. Pt was placed back in the room and in the bed at this time

## 2021-03-16 NOTE — Progress Notes (Signed)
CSW contacted APS, Ignacia Bayley to get an update on patient and if she staffed with her supervisor in regards to the next steps. Hansel Starling is currently on a home visit and would let CSW know later today.

## 2021-03-16 NOTE — ED Notes (Signed)
Received verbal report from [place] G RN at this time ?

## 2021-03-17 NOTE — ED Notes (Signed)
Patient is resting comfortably. 

## 2021-03-17 NOTE — ED Notes (Signed)
Noted foul odor in room. When asked the pt if he had a bm on himself he said yes. Discussed with pt that if he needed to use the restroom urinate or have a BM then he needed to let the RN staff or sitter know and we would escort him to the restroom for him to do so.

## 2021-03-17 NOTE — Progress Notes (Signed)
CSW received a call from Ignacia Bayley with APS stating that they will not be filing guardianship for patient. CSW was told they do not know what to do for patient and that he has no payor source for a group home.  CSW will send an e-mail to [facility] leadership for guidance.

## 2021-03-17 NOTE — ED Notes (Signed)
Snack and drink was placed at patient bedside.

## 2021-03-17 NOTE — ED Notes (Signed)
Advised by sitter pt is having loose stools

## 2021-03-17 NOTE — ED Notes (Signed)
Patient is resting comfortably. Sleeping at this time. Sitter at bedside

## 2021-03-18 LAB — CBG MONITORING, ED
Glucose-Capillary: 63 mg/dL — ABNORMAL LOW (ref 70–99)
Glucose-Capillary: 164 mg/dL — ABNORMAL HIGH (ref 70–99)

## 2021-03-18 NOTE — ED Notes (Signed)
Patient CBG was 63, the Nurse was informed.

## 2021-03-18 NOTE — Progress Notes (Signed)
CSW contacted [place]. Spoke to Chad and made a Care Coordination referral.

## 2021-03-18 NOTE — ED Notes (Signed)
Pt ate full dinner tray. Pt calm and cooperative.

## 2021-03-18 NOTE — ED Notes (Signed)
Staff ambulated with pt to bathroom and back without incident.

## 2021-03-18 NOTE — ED Notes (Signed)
Received verbal report from Cortney M RN at this time 

## 2021-03-18 NOTE — Progress Notes (Signed)
Per [place] Must, Pt was working with [facility] on disability claim in 2017. CSW called [facility] in an effort to gather any possible information about state of case. Left HIPAA compliant voicemail requesting call back.

## 2021-03-18 NOTE — ED Notes (Signed)
Pt was given orange juice cup and ate 80% of breakfast.

## 2021-03-19 LAB — CBG MONITORING, ED
Glucose-Capillary: 59 mg/dL — ABNORMAL LOW (ref 70–99)
Glucose-Capillary: 68 mg/dL — ABNORMAL LOW (ref 70–99)
Glucose-Capillary: 80 mg/dL (ref 70–99)
Glucose-Capillary: 218 mg/dL — ABNORMAL HIGH (ref 70–99)
Glucose-Capillary: 223 mg/dL — ABNORMAL HIGH (ref 70–99)

## 2021-03-19 NOTE — ED Notes (Signed)
Pt provided graham crackers at this time.  

## 2021-03-19 NOTE — ED Notes (Signed)
This Clinical research associate brought pt his breakfast. Pt stated "I need to go to bathroom". This writer helped pt stand up, but pt then had a very loose stool on himself. RN notified that patient had a loose stool. Pt cleaned up, changed and sitting up to eat his breakfast. Will continue to monitor pt

## 2021-03-19 NOTE — ED Notes (Signed)
Pt's CBG taken, pt is in 218. RN notified.

## 2021-03-19 NOTE — ED Notes (Signed)
Pt's lunch tray has arrived. Sat on bedside table. Will continue to monitor pt

## 2021-03-19 NOTE — ED Notes (Signed)
Dinner arrived. 

## 2021-03-19 NOTE — ED Notes (Signed)
Pt's CBG 68. This Clinical research associate provided pt with an orange juice. RN notified. Will continue to monitor pt

## 2021-03-19 NOTE — ED Notes (Signed)
Patient resting quietly in his room

## 2021-03-19 NOTE — ED Notes (Addendum)
PT had a BM in his bed,  while cleaning PT this NT  asked , could tell the NT when he has to go to the bathroom PT nodded his head yes. PT was taken to the bathroom at start of shift PT at that time urinated . PT appears to only have a BM on self.

## 2021-03-19 NOTE — ED Notes (Signed)
Pt asked "bathroom". This Clinical research associate quickly stood pt up and walked him to the bathroom. Pt walked fine from room to BR and then back to room. Pt back on his bed. Will continue to monitor pt

## 2021-03-20 LAB — CBG MONITORING, ED: Glucose-Capillary: 191 mg/dL — ABNORMAL HIGH (ref 70–99)

## 2021-03-20 NOTE — ED Notes (Signed)
Pt cleaned, large amount of lose light brown stool noted. PRN imodium to be administered.

## 2021-03-20 NOTE — ED Notes (Signed)
Breakfast orders placed 

## 2021-03-21 LAB — CBG MONITORING, ED: Glucose-Capillary: 129 mg/dL — ABNORMAL HIGH (ref 70–99)

## 2021-03-21 NOTE — Progress Notes (Signed)
CSW called [facility] in an effort to find out more about case. Left message requesting call back.

## 2021-03-21 NOTE — ED Notes (Signed)
Breakfast orders placed 

## 2021-03-21 NOTE — Progress Notes (Signed)
CSW emailed [employer] in an effort to work towards obtaining disability benefits to assist in placement of Pt.

## 2021-03-21 NOTE — ED Provider Notes (Signed)
Emergency Medicine Observation Re-evaluation Note  Garrett [name] is a 35 y.o. male, seen on rounds today.  Pt initially presented to the ED for complaints of Schizophrenia (Per report, Pt has a dx of schizophrenia.  Friends brought him to the ED out of concern because he had not been eating, speaking, or communicating for several weeks.) Currently, the patient is resting in bed.  Physical Exam  BP 102/82 (BP Location: Right Arm)    Pulse 72    Temp 98.8 F (37.1 C) (Oral)    Resp 15    SpO2 97%  Physical Exam General: no distress Cardiac: well perfused Lungs: even and unlabored Psych: no agitation  ED Course / MDM  EKG:EKG Interpretation  Date/Time:  Friday March 04 2021 23:48:16 EST Ventricular Rate:  88 PR Interval:  144 QRS Duration: 78 QT Interval:  356 QTC Calculation: 430 R Axis:   73 Text Interpretation: Normal sinus rhythm Normal ECG When compared to prior, similar appearance. NO STEMI Confirmed by Theda Belfast ([id number]) on 03/05/2021 4:31:55 PM  I have reviewed the labs performed to date as well as medications administered while in observation.  Recent changes in the last 24 hours include none.  Plan  Current plan is for CSW working on placement.  Garrett [name] is not under involuntary commitment.     Garrett Avena, MD 03/21/21 1031

## 2021-03-22 LAB — CBG MONITORING, ED
Glucose-Capillary: 64 mg/dL — ABNORMAL LOW (ref 70–99)
Glucose-Capillary: 160 mg/dL — ABNORMAL HIGH (ref 70–99)
Glucose-Capillary: 273 mg/dL — ABNORMAL HIGH (ref 70–99)

## 2021-03-22 NOTE — ED Provider Notes (Signed)
Emergency Medicine Observation Re-evaluation Note  Garrett [name] is a 35 y.o. male, seen on rounds today.  Pt initially presented to the ED for complaints of Schizophrenia (Per report, Pt has a dx of schizophrenia.  Friends brought him to the ED out of concern because he had not been eating, speaking, or communicating for several weeks.) Currently, the patient is speaking via translator device with our nursing staff about his day.  Physical Exam  BP (!) 85/58 (BP Location: Right Arm)    Pulse 85    Temp 98.5 F (36.9 C) (Oral)    Resp 18    SpO2 96%  Physical Exam General: No distress, sitting upright, speaking clearly Cardiac: Regular rate and rhythm Lungs: No increased work of breathing Psych: Calm  ED Course / MDM  EKG:EKG Interpretation  Date/Time:  Friday March 04 2021 23:48:16 EST Ventricular Rate:  88 PR Interval:  144 QRS Duration: 78 QT Interval:  356 QTC Calculation: 430 R Axis:   73 Text Interpretation: Normal sinus rhythm Normal ECG When compared to prior, similar appearance. NO STEMI Confirmed by Theda Belfast ([id number]) on 03/05/2021 4:31:55 PM  I have reviewed the labs performed to date as well as medications administered while in observation.  Recent changes in the last 24 hours include none.  Plan  Current plan is for behavioral health placement.  Garrett [name] is not under involuntary commitment.     Gerhard Munch, MD 03/22/21 [phone]

## 2021-03-22 NOTE — ED Notes (Signed)
Pt said he speaks [name].Used [employer] Translator [phone] to ask pt questions. Pt responded to each question with one word responses yes, no, or okay. Pt denies SI, HI, and AVH. Pt says he his mood is okay. Pt is alert and oriented to self, situation, time, and place.

## 2021-03-22 NOTE — ED Notes (Signed)
During pt interaction pt is nonverbal. Was not reported by previous nurse. Pt is non-interactive. Has stayed in bed during shift. Nods and gestures appropriately. Appears to understand what is being spoke to him as he follows commands. For example during medication pass informed he needed to sit to which he stood up but had no verbal communication with nurse. Pt is cooperative.

## 2021-03-22 NOTE — Progress Notes (Signed)
Inpatient Diabetes Program Recommendations  AACE/ADA: New Consensus Statement on Inpatient Glycemic Control (2015)  Target Ranges:  Prepandial:   less than 140 mg/dL      Peak postprandial:   less than 180 mg/dL (1-2 hours)      Critically ill patients:  140 - 180 mg/dL   Lab Results  Component Value Date   GLUCAP 160 (H) 03/22/2021   HGBA1C 12.9 (A) 05/26/2020    Diabetes history: Type 2 DM Current orders for Inpatient glycemic control: metformin 1000 mg QD, Semglee 40 units QD  Inpatient Diabetes Program Recommendations:   Noted hypoglycemia this Am. Consider decreasing Semglee to 36 units qd  Thanks, Lujean Rave, MSN, RNC-OB Diabetes Coordinator [phone] (8a-5p)

## 2021-03-22 NOTE — Progress Notes (Signed)
CSW spoke with [name] at the [facility] who informed CSW that they have no record of patient. CSW was transferred to the director Alesha Price Blanks. CSW left a message requesting a call back.

## 2021-03-23 LAB — CBG MONITORING, ED
Glucose-Capillary: 98 mg/dL (ref 70–99)
Glucose-Capillary: 301 mg/dL — ABNORMAL HIGH (ref 70–99)
Glucose-Capillary: 154 mg/dL — ABNORMAL HIGH (ref 70–99)
Glucose-Capillary: 66 mg/dL — ABNORMAL LOW (ref 70–99)
Glucose-Capillary: 95 mg/dL (ref 70–99)

## 2021-03-23 NOTE — ED Notes (Addendum)
While walking to the bathroom PT appeared to sit down on the floor, when this NT checked PT CBG it was low (66) sitter gave PT Orange juice and short bread cookies as instructed my Charity fundraiser.

## 2021-03-23 NOTE — ED Notes (Signed)
Pt defecated on himself. Was assisted to the shower by sitter. Provided with clean scrubs. RN cleaned room. New lenin and bed sheets provided. Pt fall risk assessment updated due to noticing impaired gait and altered elimination.

## 2021-03-23 NOTE — ED Notes (Signed)
Info provided by  Staff . Pt was walking to BR and stopped to sit on floor. Pt 's CBG was checked and reading was 66. Pt alert and able to ambulate. OJ given by staff.

## 2021-03-23 NOTE — Progress Notes (Signed)
CSW contacted the [facility] after not hearing back from anyone yesterday. CSW left another voicemail with the director Alesha Price Blanks.

## 2021-03-23 NOTE — Progress Notes (Signed)
CSW received a call back from the director Voncille Lo from the [facility]. Helmut Muster stated that she can do a phone interview with patient to apply for disability on 03/29/21 at 12:00 PM. Paperwork will be sent to CSW for patients signature and returned by 03/29/21.

## 2021-03-23 NOTE — ED Notes (Addendum)
Retake PT CBG reads 95.

## 2021-03-23 NOTE — Progress Notes (Signed)
CSW met with Pt at bedside with assistance of AVN video interpreter # [phone].  CSW asked Pt if he remembered previously working with [facility] to apply for disability, Pt replied, "I think so."  CSW explained that [facility] would be starting a new application for disability on his behalf and that paperwork would allow [place] to gather his information. Pt verbally agreed and also signed and dated paperwork.  CSW sent signed paperwork back to [facility] via secure email.

## 2021-03-24 LAB — CBG MONITORING, ED
Glucose-Capillary: 142 mg/dL — ABNORMAL HIGH (ref 70–99)
Glucose-Capillary: 201 mg/dL — ABNORMAL HIGH (ref 70–99)
Glucose-Capillary: 134 mg/dL — ABNORMAL HIGH (ref 70–99)

## 2021-03-24 NOTE — ED Notes (Signed)
Concerns for pt have several days of watery diarrhea communicated with Dr Posey Rea. Informed pot has been receiving PRN imodium at least for the past 10 days. Pt denies abdominal pain. No tenderness of palpation. Pt communication is minimal but able to nod appropriately and sometimes verbally communicates with staff. Pt had another BM accident today. Pt cleaned. No new orders at this time.

## 2021-03-24 NOTE — ED Notes (Signed)
During pt interaction noted improvement today with interacting with staff. Pt has been cooperative with medications and VS. Pt gestures appropriately when asked questions. Was interactive during snack time but did not communicate with words. Pt noted to continue to have unsteady gait. Had 3 loose stools since beginning of shift and PRN imodium given. Communicated with EDP. Unable to obtain stool sample as pt had urgency. Due to pending stool results placed under enteric precautions. Door remains closed and concerns for fall risk placed bed alarm on hospital bed.

## 2021-03-24 NOTE — ED Provider Notes (Signed)
Emergency Medicine Observation Re-evaluation Note  Garrett [name] is a 35 y.o. male, seen on rounds today.  Pt initially presented to the ED for complaints of Schizophrenia (Per report, Pt has a dx of schizophrenia.  Friends brought him to the ED out of concern because he had not been eating, speaking, or communicating for several weeks.) Currently, the patient is resting.  Physical Exam  BP 101/71 (BP Location: Right Arm)    Pulse 88    Temp 98.2 F (36.8 C) (Oral)    Resp 16    SpO2 98%  Physical Exam General: resting comfortably, NAD Lungs: normal WOB Psych: currently calm and resting  ED Course / MDM  EKG:EKG Interpretation  Date/Time:  Friday March 04 2021 23:48:16 EST Ventricular Rate:  88 PR Interval:  144 QRS Duration: 78 QT Interval:  356 QTC Calculation: 430 R Axis:   73 Text Interpretation: Normal sinus rhythm Normal ECG When compared to prior, similar appearance. NO STEMI Confirmed by Theda Belfast ([id number]) on 03/05/2021 4:31:55 PM  I have reviewed the labs performed to date as well as medications administered while in observation.  Recent changes in the last 24 hours include none.  Plan  Current plan is for placement by behavioral health.  Garrett [name] is not under involuntary commitment.     Garrett [name], [place] 03/24/21 [id number]

## 2021-03-24 NOTE — ED Notes (Signed)
Pt woke requesting to use the restroom. Pt's sitter encouraged pt to take a shower and clean himself up. Pt given supplies for shower. Pt's bed linen was changed. Will continue to monitor.

## 2021-03-24 NOTE — ED Notes (Addendum)
Voiced concern for sitter assignment. Per staffing no sitter available for pt. Charge RN [employer] made aware

## 2021-03-24 NOTE — Progress Notes (Signed)
Inpatient Diabetes Program Recommendations  AACE/ADA: New Consensus Statement on Inpatient Glycemic Control   Target Ranges:  Prepandial:   less than 140 mg/dL      Peak postprandial:   less than 180 mg/dL (1-2 hours)      Critically ill patients:  140 - 180 mg/dL    Latest Reference Range & Units 03/23/21 07:28 03/23/21 09:02 03/23/21 11:11 03/23/21 16:25 03/23/21 17:00 03/24/21 07:27 03/24/21 10:55  Glucose-Capillary 70 - 99 mg/dL 98 176 (H) 160 (H) 66 (L) 95 142 (H) 201 (H)   Review of Glycemic Control  Current orders for Inpatient glycemic control: Semglee 40 units daily, Metformin XR 1000 mg QAM  Inpatient Diabetes Program Recommendations:    Insulin: Please decrease Semglee to 37 units daily.  NOTE: Glucose ranged from 66-301 on 03/23/21 and fasting glucose 142 mg/dl this morning. Recommend decreasing Semglee slightly to prevent hypoglycemia.   Thanks, Orlando Penner, RN, MSN, CDE Diabetes Coordinator Inpatient Diabetes Program [phone] (Team Pager from 8am to 5pm)

## 2021-03-25 LAB — CBG MONITORING, ED
Glucose-Capillary: 145 mg/dL — ABNORMAL HIGH (ref 70–99)
Glucose-Capillary: 186 mg/dL — ABNORMAL HIGH (ref 70–99)

## 2021-03-25 NOTE — ED Notes (Signed)
Dr. Glick at bedside.  

## 2021-03-25 NOTE — ED Notes (Signed)
Patient had bm on self.  Patient in shower at this time and new scrubs and diaper provided.

## 2021-03-25 NOTE — ED Provider Notes (Signed)
Emergency Medicine Observation Re-evaluation Note  Garrett [name] is a 35 y.o. male, seen on rounds today.  Pt initially presented to the ED for complaints of Schizophrenia (Per report, Pt has a dx of schizophrenia.  Friends brought him to the ED out of concern because he had not been eating, speaking, or communicating for several weeks.) Currently, the patient is awake and alert sitting upright.  Physical Exam  BP (!) 93/59 (BP Location: Left Arm)    Pulse 85    Temp 98.5 F (36.9 C) (Oral)    Resp 15    SpO2 98%  Physical Exam General: No distress Cardiac: Regular rate and rhythm Lungs: No increased work of breathing Psych: Calm  ED Course / MDM  EKG:EKG Interpretation  Date/Time:  Friday March 04 2021 23:48:16 EST Ventricular Rate:  88 PR Interval:  144 QRS Duration: 78 QT Interval:  356 QTC Calculation: 430 R Axis:   73 Text Interpretation: Normal sinus rhythm Normal ECG When compared to prior, similar appearance. NO STEMI Confirmed by Theda Belfast ([id number]) on 03/05/2021 4:31:55 PM  I have reviewed the labs performed to date as well as medications administered while in observation.  Recent changes in the last 24 hours include fall, without apparent injury.  Plan  Current plan is for placement.  Garrett [name] is not under involuntary commitment.     Gerhard Munch, MD 03/25/21 [phone]

## 2021-03-25 NOTE — ED Provider Notes (Signed)
Patient apparently stumbled and fell to the floor.  No apparent injury from the fall.  On exam, no evidence of injury, no need for imaging.   Dione Booze, MD 03/25/21 [phone]

## 2021-03-25 NOTE — ED Notes (Signed)
Pt was ambulating independently to bathroom. Has ambulated independently x2 prior without problem/assistance. Pt stumbled back and leaned against wall eased self to the floor. Pt was then assisted by sitter to bathroom. Pt did not sustain any injury from this fall. No hematoma to head noted. Denied back or head pain.  Due to concerns for impaired understanding pt palpated and no grimacing noted. Pt Fall Risk updated to high fall risk. Pt bed alarm in place. Dr Preston Fleeting informed of occurrence. No orders. Pt has no complaints.

## 2021-03-26 LAB — CBG MONITORING, ED
Glucose-Capillary: 109 mg/dL — ABNORMAL HIGH (ref 70–99)
Glucose-Capillary: 199 mg/dL — ABNORMAL HIGH (ref 70–99)
Glucose-Capillary: 207 mg/dL — ABNORMAL HIGH (ref 70–99)

## 2021-03-26 NOTE — ED Notes (Signed)
Patient showered.

## 2021-03-26 NOTE — ED Notes (Signed)
Breakfast tray ordered 

## 2021-03-27 LAB — CBG MONITORING, ED
Glucose-Capillary: 78 mg/dL (ref 70–99)
Glucose-Capillary: 103 mg/dL — ABNORMAL HIGH (ref 70–99)
Glucose-Capillary: 129 mg/dL — ABNORMAL HIGH (ref 70–99)
Glucose-Capillary: 210 mg/dL — ABNORMAL HIGH (ref 70–99)

## 2021-03-27 NOTE — ED Notes (Signed)
Pt walked to the bathroom without any difficulty. Pt returned to bed and requested a candy. This Clinical research associate allowed pt to have only 1 candy as promised. RN aware, will continue to monitor pt

## 2021-03-27 NOTE — ED Notes (Signed)
Pt stood up after eating entire breakfast tray and requested to go to the bathroom by saying "bathroom". This writer walked with pt to the bathroom. Pt when arrived to toilet, began to try to sit, slip and loose balance. This writer reminded pt to grab onto bathroom rail and to slowly sit down. Pt did so and did not ave difficulty anymore. Pt walked back to his room without any problems and even said "thank you so much". Pt now laying on bed, TV turned on. RN notified. Will continue to monitor pt and remind pt if needed too, to walk to the bathroom.

## 2021-03-27 NOTE — ED Notes (Signed)
Pt had small BM on self - brief changed - pt unsteady just standing at bedside with two person assist -

## 2021-03-27 NOTE — ED Notes (Signed)
Pt's CBG is 103. RN notified. Pt requested for 2 graham crackers, 1 apple sauce and orange juice

## 2021-03-27 NOTE — ED Notes (Signed)
Pt had a witnessed fall. Pt had asked to go to the bathroom by stating "bathroom". This writer went to grab her gloves and came back when the pt was ready to sit on the side of the bed. When the writer entered the room, the pt stood next to the bed and took one step forward. As this Clinical research associate was done putting her gloves on, the patient had fallen onto his bottom, this writer walked towards patient and grabbed his hands to help him stand up. Pt then swung his head backwards and hit his head onto the sink of his room. This Clinical research associate called out for the RN of the floor. Pt was then stood up and said "bathroom", RN did a quick evaluation on patient. This Clinical research associate and RN asked pt to sit down. Pt sat down and them laid down on his bed and closed his eyes. This Clinical research associate and RN asked pt to sit up and asked if he was dizzy, blood was noticed onto his bed. When checking on his head, pt had a small laceration on his head, RN proceeded to grab MD while 2 nurse tech sitters helped pt get his wound covered and vitals. MD came in and evaluated pt, pt asked to go to the bathroom, this writer walked with him to the bathroom. Pt remained in a calm state and walked well to the bathroom and back to his room. Pt now has 4 staples on his head, pt then said "I'm hungry". This Clinical research associate promised pt after we received his orthostatic vitals. Pt was compliant with this. Orthostatic vitals done, RN notified of his BP when lying, sitting, standing and standing after 3 minutes. Pt is now an orthostatic vital. CBG was also taken. This Clinical research associate then sat pt down on his bed. Pt asked for a shower by saying "I need shower", but due to his fall... this writer told pt that he must lay on his bed for a couple of hours. Pt was given applesauce, bed alarm on and all 4 bedside rails are up. Yellow band on pt, will continue to monitor pt

## 2021-03-27 NOTE — ED Notes (Signed)
Pt's lunch has arrived. This Clinical research associate sat him up and brought his lunch in front of him. Pt sitting up and eating his lunch now. Will continue to monitor pt

## 2021-03-27 NOTE — ED Notes (Signed)
Sitter at bedside.

## 2021-03-27 NOTE — ED Notes (Signed)
Pt's dinner has arrived. Pt sat up on bed, and all food is opened. Will continue to monitor pt

## 2021-03-27 NOTE — ED Provider Notes (Signed)
I was called to evaluate the patient as he had a low impact fall from standing, striking the back of his head on the ground.  There is a small linear laceration, approximately 2 cm, but large enough to warrant stapling.  Discussed with the patient and applied for staples.  These will need to be removed in 7 to 10 days (Jan 1st to 3rd, 2023)  .Marland KitchenLaceration Repair  Date/Time: 03/27/2021 5:07 PM Performed by: Terald Sleeper, MD Authorized by: Terald Sleeper, MD   Consent:    Consent obtained:  Verbal   Consent given by:  Patient Universal protocol:    Procedure explained and questions answered to patient or proxy's satisfaction: yes     Immediately prior to procedure, a time out was called: yes     Patient identity confirmed:  Arm band Laceration details:    Location:  Scalp Treatment:    Irrigation solution:  Sterile saline Skin repair:    Repair method:  Staples   Number of staples:  4 Approximation:    Approximation:  Close    Terald Sleeper, MD 03/27/21 [phone]

## 2021-03-27 NOTE — ED Notes (Signed)
Pt went to stand up to use restroom. Sitter Marcelino Duster was at doorway observing pt, asking him if he needed assistance. Pt then fell, hitting head on sink. Pt had 3cm lac on back of head. Dr Renaye Rakers made aware and placed 3 staples, to be removed in 7-10 days (1/1-1/4). Pt had no LOC. No complaints of pain. Orthostatic vitals performed. Pt does have orthostatic hypotension. Pt placed back in bed, alarm on for standby assistance when standing from sitter/tech/RN. Fall bracelet placed on wrist.

## 2021-03-27 NOTE — ED Notes (Signed)
Pt walked to the bathroom without any difficulty this time. Pt asked for his candy but due to him just having one from a tech, this writer told pt that he could have one later since he is a diabetic. Pt was complaint for this, will continue to monitor pt

## 2021-03-27 NOTE — ED Notes (Signed)
Pt's breakfast tray has arrived. This sitter sat pt up on bed and reminded him to speka out for bathroom. Pt nodded he understood. Will continue to monitor pt

## 2021-03-27 NOTE — ED Notes (Addendum)
Pt with large watery BM on self - pt cleaned up, brief changed, pt scrubs changed

## 2021-03-27 NOTE — ED Notes (Addendum)
Pt's CBG is 78. Night shift RN notified due to day shift RN not present yet. Edit: Day shift notified.   Also introduced to pt, pt had been asleep and clean this morning. Will continue to monitor pt

## 2021-03-27 NOTE — ED Notes (Addendum)
Pt checked for BM and brief was clean at this time. Pt was offered to have help going to the bathroom if he needed to, pt declined.  VS obtained, BP low and upon further investigation low BP is normal and BP upon medical clearance within the same range

## 2021-03-28 ENCOUNTER — Emergency Department (HOSPITAL_COMMUNITY): Payer: Medicaid Other

## 2021-03-28 LAB — CBC WITH DIFFERENTIAL/PLATELET
Abs Immature Granulocytes: 0.02 10*3/uL (ref 0.00–0.07)
Basophils Absolute: 0 10*3/uL (ref 0.0–0.1)
Basophils Relative: 1 %
Eosinophils Absolute: 0.3 10*3/uL (ref 0.0–0.5)
Eosinophils Relative: 4 %
HCT: 30.5 % — ABNORMAL LOW (ref 39.0–52.0)
Hemoglobin: 9.8 g/dL — ABNORMAL LOW (ref 13.0–17.0)
Immature Granulocytes: 0 %
Lymphocytes Relative: 47 %
Lymphs Abs: 3.7 10*3/uL (ref 0.7–4.0)
MCH: 31.8 pg (ref 26.0–34.0)
MCHC: 32.1 g/dL (ref 30.0–36.0)
MCV: 99 fL (ref 80.0–100.0)
Monocytes Absolute: 0.8 10*3/uL (ref 0.1–1.0)
Monocytes Relative: 10 %
Neutro Abs: 2.9 10*3/uL (ref 1.7–7.7)
Neutrophils Relative %: 38 %
Platelets: 204 10*3/uL (ref 150–400)
RBC: 3.08 MIL/uL — ABNORMAL LOW (ref 4.22–5.81)
RDW: 12.8 % (ref 11.5–15.5)
WBC: 7.8 10*3/uL (ref 4.0–10.5)
nRBC: 0 % (ref 0.0–0.2)

## 2021-03-28 LAB — COMPREHENSIVE METABOLIC PANEL
ALT: 49 U/L — ABNORMAL HIGH (ref 0–44)
AST: 34 U/L (ref 15–41)
Albumin: 2.7 g/dL — ABNORMAL LOW (ref 3.5–5.0)
Alkaline Phosphatase: 95 U/L (ref 38–126)
Anion gap: 6 (ref 5–15)
BUN: 14 mg/dL (ref 6–20)
CO2: 28 mmol/L (ref 22–32)
Calcium: 8.5 mg/dL — ABNORMAL LOW (ref 8.9–10.3)
Chloride: 106 mmol/L (ref 98–111)
Creatinine, Ser: 1.24 mg/dL (ref 0.61–1.24)
GFR, Estimated: >60 mL/min (ref >60)
Glucose, Bld: 97 mg/dL (ref 70–99)
Potassium: 3.8 mmol/L (ref 3.5–5.1)
Sodium: 140 mmol/L (ref 135–145)
Total Bilirubin: 0.4 mg/dL (ref 0.3–1.2)
Total Protein: 6.7 g/dL (ref 6.5–8.1)

## 2021-03-28 LAB — GASTROINTESTINAL PANEL BY PCR, STOOL (REPLACES STOOL CULTURE)

## 2021-03-28 LAB — URINALYSIS, ROUTINE W REFLEX MICROSCOPIC
Bilirubin Urine: NEGATIVE
Glucose, UA: NEGATIVE mg/dL
Ketones, ur: NEGATIVE mg/dL
Leukocytes,Ua: NEGATIVE
Nitrite: NEGATIVE
Protein, ur: NEGATIVE mg/dL
Specific Gravity, Urine: 1.015 (ref 1.005–1.030)
pH: 5.5 (ref 5.0–8.0)

## 2021-03-28 LAB — URINALYSIS, MICROSCOPIC (REFLEX)

## 2021-03-28 LAB — CBG MONITORING, ED
Glucose-Capillary: 180 mg/dL — ABNORMAL HIGH (ref 70–99)
Glucose-Capillary: 151 mg/dL — ABNORMAL HIGH (ref 70–99)
Glucose-Capillary: 120 mg/dL — ABNORMAL HIGH (ref 70–99)
Glucose-Capillary: 103 mg/dL — ABNORMAL HIGH (ref 70–99)

## 2021-03-28 MED ORDER — SODIUM CHLORIDE 0.9 % IV BOLUS
1000.0000 mL | Freq: Once | INTRAVENOUS | Status: AC
  Administered 2021-03-28: 03:00:00 1000 mL via INTRAVENOUS

## 2021-03-28 NOTE — ED Notes (Addendum)
Pt extremely unsteady on feet, showing difficulty maintaining balance on toilet. MD Pollina notified of findings as pt is 12 hours out past fall/head injury with positive orthostatics. Orders for CT scan, NS bolus, lab work, and urine obtained.

## 2021-03-28 NOTE — ED Notes (Signed)
Patient transported to CT 

## 2021-03-28 NOTE — ED Notes (Signed)
Pt ate breakfast and is resting comfortably in room. Interpreter used to make pt aware of need for stool sample and make sure all other needs were addressed.

## 2021-03-28 NOTE — ED Notes (Signed)
Breakfast orders placed 

## 2021-03-28 NOTE — ED Notes (Signed)
Patient is resting comfortably. Calm and cooperative at this time. Sitter at [facility]

## 2021-03-28 NOTE — ED Notes (Signed)
Pt given personal hygiene products and took a shower. °

## 2021-03-28 NOTE — ED Notes (Signed)
Pt was incontinent of BM, cleaned pt and applied a clean brief and bedding. I explained to pt w/assistance of interpreter that he needs to let us know when he has to go to the bathroom and we'll assist him to bathroom.

## 2021-03-29 LAB — CBG MONITORING, ED
Glucose-Capillary: 72 mg/dL (ref 70–99)
Glucose-Capillary: 126 mg/dL — ABNORMAL HIGH (ref 70–99)
Glucose-Capillary: 211 mg/dL — ABNORMAL HIGH (ref 70–99)

## 2021-03-29 NOTE — ED Notes (Signed)
BP rechecked with dinamap in right arm and is 86/56.  Pt asymptomatic and has run low BP's  prior.  Pt given something to drink and will recheck BP in 30 min.

## 2021-03-29 NOTE — Progress Notes (Signed)
Patient completed disability application interview with the servant center. CSW was told this process can take 3-6 months for approval or denial.

## 2021-03-29 NOTE — ED Notes (Signed)
Pt's BP rechecked. 79/51 with Dinamap and 78/50 manual in left arm.  Pt lying down in bed and seemingly asymptomatic.  He is responsive and drinking well. Dr. Preston Fleeting notified. Plan is to hold off on fluids for now and make sure he eats a good breakfast.  Cont to monitor and recheck BP after breakfast and then f/u with provider

## 2021-03-29 NOTE — ED Notes (Signed)
Patient given a snack and drink. 

## 2021-03-29 NOTE — ED Notes (Signed)
Lunch ordered 

## 2021-03-30 LAB — CBG MONITORING, ED
Glucose-Capillary: 114 mg/dL — ABNORMAL HIGH (ref 70–99)
Glucose-Capillary: 187 mg/dL — ABNORMAL HIGH (ref 70–99)
Glucose-Capillary: 176 mg/dL — ABNORMAL HIGH (ref 70–99)

## 2021-03-30 NOTE — ED Notes (Signed)
Lunch Ordered °

## 2021-03-31 LAB — CBG MONITORING, ED
Glucose-Capillary: 262 mg/dL — ABNORMAL HIGH (ref 70–99)
Glucose-Capillary: 147 mg/dL — ABNORMAL HIGH (ref 70–99)

## 2021-03-31 NOTE — ED Notes (Signed)
Lunch delivered. 

## 2021-04-01 ENCOUNTER — Encounter (HOSPITAL_COMMUNITY): Payer: Self-pay

## 2021-04-01 LAB — CBG MONITORING, ED
Glucose-Capillary: 183 mg/dL — ABNORMAL HIGH (ref 70–99)
Glucose-Capillary: 108 mg/dL — ABNORMAL HIGH (ref 70–99)

## 2021-04-01 NOTE — ED Notes (Signed)
Pt successfully ambulated to bathroom with assistance of sitter

## 2021-04-01 NOTE — ED Notes (Signed)
At nite patient needs to be toileted every two hours,keep from stooling ,urinating in bed

## 2021-04-01 NOTE — Progress Notes (Signed)
Patients claim for SSDI/SSI was filed and submitted by the servant center. CSW was told this could take 3-6 months to process. Patient needs disability to pay for placement.

## 2021-04-02 LAB — CBG MONITORING, ED
Glucose-Capillary: 74 mg/dL (ref 70–99)
Glucose-Capillary: 141 mg/dL — ABNORMAL HIGH (ref 70–99)
Glucose-Capillary: 148 mg/dL — ABNORMAL HIGH (ref 70–99)

## 2021-04-02 NOTE — ED Notes (Signed)
Pt's dinner has arrived. Pt sat up and eating his dinner. Will continue to monitor pt

## 2021-04-02 NOTE — ED Notes (Signed)
1110: Pt walked to the bathroom with Physical therapy and Clinical research associate. Pt walked to bathroom and walked around the unit. Pt asked for a snack, this writer provided pt graham crackers and apple juice.   1115: Pt's BG taken, 141. RN notified.

## 2021-04-02 NOTE — ED Notes (Signed)
Pt is high fall risk with no sitter present, bed alarms utilized.

## 2021-04-02 NOTE — ED Notes (Signed)
1235: Pt's lunch has arrived. Pt sat up and eating. Will continue to monitor pt

## 2021-04-02 NOTE — ED Notes (Signed)
Pt walked to and from BR to his room. Pt now on his bed, will continue to monitor pt

## 2021-04-02 NOTE — Evaluation (Signed)
Physical Therapy Evaluation Patient Details Name: Garrett [name] MRN: [id number] DOB: 1985-11-09 Today's Date: 04/02/2021  History of Present Illness  35 yo male with dx of schizophrenia had onset of disturbance that was observed by friends, was brought to ED on 12/2, noted withdrawal from friends, lack of intake, not himself with possibly catatonic behavior.  Pt has apparrently not been on meds, BS is 503 at admission.  Per nsg pt sustained a fall with unsteady knees early on in his admission, now referred to PT for mobility eval as SW is working on coverage for placement to [facility] but is cleared for outpatient services.  Current issue of hypotension, symptomatic.  PMHx:  schizophrenia, DM, medication non compliance, AMS, DKA, acute metabolic encephalopathy, MDD  Clinical Impression  Pt is willing to get up to walk to bathroom, has strong LOB to L side at times, but is not demonstrating a protective extension to this event.  Pt has low BP when checked in sitting of 84/69,  and return to standing was 75/49.  Reported to nursing to have this managed medically, as well as lack of availability of translation today until later on.   Despite the issues of BP and with communication, have no indications from chart that pt has help 24/7 for home as well as medical issues that must be addressed.  May not need to be supported for gait with controlled BP, so will recommend SNF for now with his restrictions and for frequency to be higher to manage him in case he is going home.  If SNF is approved can downgrade frequency, and recommend BP checks to ensure his safety is maintained.  The interpreter schedule should be hopefully more unrestricted after the weekend holiday.       Recommendations for follow up therapy are one component of a multi-disciplinary discharge planning process, led by the attending physician.  Recommendations may be updated based on patient status, additional functional criteria and insurance  authorization.  Follow Up Recommendations Skilled nursing-short term rehab (<3 hours/day)    Assistance Recommended at Discharge Frequent or constant Supervision/Assistance  Functional Status Assessment Patient has had a recent decline in their functional status and demonstrates the ability to make significant improvements in function in a reasonable and predictable amount of time.  Equipment Recommendations  None recommended by PT    Recommendations for Other Services       Precautions / Restrictions Precautions Precautions: Fall Precaution Comments: hypotensive, unstable and falling to the L Restrictions Weight Bearing Restrictions: No      Mobility  Bed Mobility Overal bed mobility: Needs Assistance Bed Mobility: Supine to Sit;Sit to Supine     Supine to sit: Supervision Sit to supine: Supervision   General bed mobility comments: Supervised for safety since pt has a fall history    Transfers Overall transfer level: Needs assistance Equipment used: 1 person hand held assist Transfers: Sit to/from Stand Sit to Stand: Min guard           General transfer comment: min guard for safety and BP taken with pt unsteady to walk, hypotensive standing    Ambulation/Gait Ambulation/Gait assistance: Min guard;Min assist Gait Distance (Feet): 50 Feet Assistive device: 1 person hand held assist Gait Pattern/deviations: Step-through pattern;Decreased stride length;Staggering left Gait velocity: reduced Gait velocity interpretation: <1.31 ft/sec, indicative of household ambulator Pre-gait activities: BP readings were taken, to assess the balance changes observed by staff General Gait Details: consistent drift to L side with pt demonstrating level posture in standing,  no leg length difference observed  Stairs            Wheelchair Mobility    Modified Rankin (Stroke Patients Only)       Balance Overall balance assessment: Needs assistance;History of  Falls Sitting-balance support: Feet supported Sitting balance-Leahy Scale: Good     Standing balance support: Single extremity supported Standing balance-Leahy Scale: Fair Standing balance comment: less than fair dynamically                             Pertinent Vitals/Pain Pain Assessment: Faces Faces Pain Scale: No hurt    Home Living Family/patient expects to be discharged to:: Unsure                   Additional Comments: has a friend who is his roommate but not available for questions    Prior Function Prior Level of Function : Independent/Modified Independent             Mobility Comments: not on AD at the time of admission       Hand Dominance        Extremity/Trunk Assessment   Upper Extremity Assessment Upper Extremity Assessment: Overall WFL for tasks assessed    Lower Extremity Assessment Lower Extremity Assessment: Generalized weakness (Pt is in the bed the majority of the time)    Cervical / Trunk Assessment Cervical / Trunk Assessment: Normal  Communication   Communication: Prefers language other than English (Burmese, Karen dialect)  Cognition Arousal/Alertness: Awake/alert Behavior During Therapy: Flat affect Overall Cognitive Status: No family/caregiver present to determine baseline cognitive functioning                                 General Comments: pt is seen with no translation due to lack of availability until later today, and will need to have this next session        General Comments General comments (skin integrity, edema, etc.): pt is up to walk with PT and nursing initially to BR, then to walk and note the instability.  Had sitting BP post gait of 84/69, and stood to get 75/49    Exercises     Assessment/Plan    PT Assessment Patient needs continued PT services  PT Problem List Decreased strength;Decreased activity tolerance;Decreased balance;Cardiopulmonary status limiting activity        PT Treatment Interventions DME instruction;Gait training;Stair training;Functional mobility training;Therapeutic activities;Therapeutic exercise;Balance training;Neuromuscular re-education;Patient/family education    PT Goals (Current goals can be found in the Care Plan section)  Acute Rehab PT Goals Patient Stated Goal: none reported, limited English PT Goal Formulation: Patient unable to participate in goal setting Potential to Achieve Goals: Fair    Frequency Min 3X/week   Barriers to discharge Decreased caregiver support has roommate but no committment to getting 24/7 assistance    Co-evaluation               AM-PAC PT "6 Clicks" Mobility  Outcome Measure Help needed turning from your back to your side while in a flat bed without using bedrails?: None Help needed moving from lying on your back to sitting on the side of a flat bed without using bedrails?: None Help needed moving to and from a bed to a chair (including a wheelchair)?: A Little Help needed standing up from a chair using your arms (e.g., wheelchair or bedside chair)?:  A Little Help needed to walk in hospital room?: A Little Help needed climbing 3-5 steps with a railing? : A Little 6 Click Score: 20    End of Session Equipment Utilized During Treatment: Gait belt Activity Tolerance: Treatment limited secondary to medical complications (Comment) Patient left: in bed;with call bell/phone within reach;with bed alarm set;with nursing/sitter in room Nurse Communication: Mobility status;Other (comment) (hypotension with standing) PT Visit Diagnosis: Unsteadiness on feet (R26.81);Muscle weakness (generalized) (M62.81);Dizziness and giddiness (R42)    Time: 0623-7628 PT Time Calculation (min) (ACUTE ONLY): 21 min   Charges:   PT Evaluation $PT Eval Moderate Complexity: 1 Mod         Ivar Drape 04/02/2021, 12:28 PM  Samul Dada, PT PhD Acute Rehab Dept. Number: [facility] [id number] and [facility] [phone]

## 2021-04-02 NOTE — ED Notes (Signed)
Pt stated "bathroom". This writer slowly got pt up from bed and headed to the bathroom, pt's knees would buckle and pt would lose his balance. This writer grabbed the nearest chair and sat pt down for a couple of seconds. Pt stood back up and continued to walk to the bathroom. While in the bathroom, pt stated "when shower?". This writer told him "you can shower now". NT on floor set up shower for pt, this writer was in the shower room with pt. Pt had a chair in the shower room and was mostly able to wash himself. Pt back on his bed, after Eleen NT changed his linens. Will continue to monitor pt

## 2021-04-02 NOTE — ED Notes (Signed)
Pt's CBG was 148, RN notified.

## 2021-04-02 NOTE — ED Notes (Signed)
PT at bedside with patient

## 2021-04-02 NOTE — ED Notes (Addendum)
0740:Pt's breakfast tray has arrived. Pt sat up on the side of the bed, eating his breakfast. Will continue to monitor pt  0805: Pt finished all of his breakfast and asked to go to the bathroom. This Clinical research associate was notified by previous sitter about pt's bedside commode. Pt was encouraged to use it, this writer remained in pt's room while he walked to [facility] and back to bed. Pt did not void. Will continue to monitor pt

## 2021-04-02 NOTE — ED Notes (Signed)
Pt walked to the bathroom, and walked back to his bedroom. Pt asked for another ice cream cup but this writer was advised to hold off on sweets due to his diabetes. Pt made aware, will continue to monitor pt

## 2021-04-03 LAB — CBG MONITORING, ED
Glucose-Capillary: 142 mg/dL — ABNORMAL HIGH (ref 70–99)
Glucose-Capillary: 110 mg/dL — ABNORMAL HIGH (ref 70–99)
Glucose-Capillary: 50 mg/dL — ABNORMAL LOW (ref 70–99)
Glucose-Capillary: 53 mg/dL — ABNORMAL LOW (ref 70–99)
Glucose-Capillary: 174 mg/dL — ABNORMAL HIGH (ref 70–99)
Glucose-Capillary: 125 mg/dL — ABNORMAL HIGH (ref 70–99)

## 2021-04-03 NOTE — ED Notes (Signed)
Patient given 2 containers of orange juice.

## 2021-04-03 NOTE — ED Notes (Signed)
Pt up to restroom with assistance. Pt now back in bed, provided drink. Continues to be unsteady on feet.

## 2021-04-03 NOTE — ED Notes (Signed)
Pt given breakfast tray

## 2021-04-04 ENCOUNTER — Encounter (HOSPITAL_COMMUNITY): Payer: Self-pay | Admitting: Pediatrics

## 2021-04-04 LAB — CBG MONITORING, ED
Glucose-Capillary: 122 mg/dL — ABNORMAL HIGH (ref 70–99)
Glucose-Capillary: 141 mg/dL — ABNORMAL HIGH (ref 70–99)
Glucose-Capillary: 200 mg/dL — ABNORMAL HIGH (ref 70–99)
Glucose-Capillary: 103 mg/dL — ABNORMAL HIGH (ref 70–99)

## 2021-04-04 NOTE — Progress Notes (Signed)
Disability application is pending. Patient needs secondary income to be accepted into a group home/family care home.

## 2021-04-04 NOTE — ED Provider Notes (Signed)
Emergency Medicine Observation Re-evaluation Note  Garrett [name] is a 36 y.o. male, seen on rounds today.  Pt initially presented to the ED for complaints of Schizophrenia Patient presented to the ED on 12/3 for decompensated schizophrenia.  He was evaluated by psychiatry and he meets inpatient criteria.  He will need disability to pay for placement.  Currently, the patient is resting on his bed.  Physical Exam  BP 97/69 (BP Location: Left Arm)    Pulse 86    Temp 98.4 F (36.9 C) (Oral)    Resp 16    SpO2 100%  Physical Exam General: Awake, alert, nondistressed Cardiac: Extremities well perfused Lungs: Breathing is even and unlabored Psych: Does not appear to be responding to internal stimuli  ED Course / MDM  EKG:EKG Interpretation  Date/Time:  Friday March 04 2021 23:48:16 EST Ventricular Rate:  88 PR Interval:  144 QRS Duration: 78 QT Interval:  356 QTC Calculation: 430 R Axis:   73 Text Interpretation: Normal sinus rhythm Normal ECG When compared to prior, similar appearance. NO STEMI Confirmed by Theda Belfast ([id number]) on 03/05/2021 4:31:55 PM  I have reviewed the labs performed to date as well as medications administered while in observation.  Recent changes in the last 24 hours include none.  Plan  Current plan is for inpatient psychiatric admission.  Garrett [name] is not under involuntary commitment.     Garrett Manchester, MD 04/05/21 [phone]

## 2021-04-04 NOTE — ED Notes (Signed)
Breakfast Orders Placed °

## 2021-04-04 NOTE — ED Notes (Signed)
Pt sleeping at this time. Will obtain vitals when he wakes up.

## 2021-04-05 LAB — CBG MONITORING, ED
Glucose-Capillary: 115 mg/dL — ABNORMAL HIGH (ref 70–99)
Glucose-Capillary: 139 mg/dL — ABNORMAL HIGH (ref 70–99)
Glucose-Capillary: 150 mg/dL — ABNORMAL HIGH (ref 70–99)
Glucose-Capillary: 83 mg/dL (ref 70–99)

## 2021-04-05 NOTE — Progress Notes (Signed)
Physical Therapy Treatment Patient Details Name: Garrett [name] MRN: [id number] DOB: [birth year] Today's Date: 04/05/2021  History of Present Illness  36 yo male with dx of schizophrenia had onset of disturbance that was observed by friends, was brought to ED on 12/2, noted withdrawal from friends, lack of intake, not himself with possibly catatonic behavior.  Pt has apparrently not been on meds, BS is 503 at admission.  Per nsg pt sustained a fall with unsteady knees early on in his admission, now referred to PT for mobility eval as SW is working on coverage for placement to [facility] but is cleared for outpatient services.  Current issue of hypotension, symptomatic.  PMHx:  schizophrenia, DM, medication non compliance, AMS, DKA, acute metabolic encephalopathy, MDD  Clinical Impression  Pt was seen for therapy with measures of BP hindering safety with walk. Supine BP 105/70, sitting 85/59 and standing 76/50.  Pt is dizzy with all standing tasks, unstable to stand alone.  Pt was seen with translation for his language, reports he is not normally having any falls, no previous issues of dizziness.  Follow acutely with him and will need management of BP to work on gait, and will be better served to be up in a chair instead of in bed at all times.  Pt is motivated to sit on side of bed with therapy, as well as asking to stay there afterward.  Progress as vitals permit.     Recommendations for follow up therapy are one component of a multi-disciplinary discharge planning process, led by the attending physician.  Recommendations may be updated based on patient status, additional functional criteria and insurance authorization.  Follow Up Recommendations Skilled nursing-short term rehab (<3 hours/day)    Assistance Recommended at Discharge Frequent or constant Supervision/Assistance  Patient can return home with the following       Equipment Recommendations None recommended by PT  Recommendations for Other  Services       Functional Status Assessment       Precautions / Restrictions Precautions Precautions: Fall Precaution Comments: Hypotensive, off balance Restrictions Weight Bearing Restrictions: No      Mobility  Bed Mobility Overal bed mobility: Needs Assistance Bed Mobility: Supine to Sit     Supine to sit: Supervision     General bed mobility comments: asked to remain on side of bed and sitter agreed    Transfers Overall transfer level: Needs assistance Equipment used: 1 person hand held assist Transfers: Sit to/from Stand Sit to Stand: Min guard           General transfer comment: min guard with cues for posture of arm receiving BP measures    Ambulation/Gait             Pre-gait activities: BP was Hypotensive for all but supine General Gait Details: deferred due to BP  Stairs            Wheelchair Mobility    Modified Rankin (Stroke Patients Only)       Balance Overall balance assessment: Needs assistance Sitting-balance support: Feet supported Sitting balance-Leahy Scale: Good     Standing balance support: No upper extremity supported Standing balance-Leahy Scale: Fair Standing balance comment: less than fair dynamically                             Pertinent Vitals/Pain Pain Assessment: Faces Faces Pain Scale: No hurt    Home Living Family/patient expects to be discharged to::  Unsure                   Additional Comments: has roommate in level entrance home, no AD owned and level home.    Prior Function Prior Level of Function : Independent/Modified Independent             Mobility Comments: no AD owned or previously used       Journalist, newspaper        Extremity/Trunk Assessment   Upper Extremity Assessment Upper Extremity Assessment: Overall WFL for tasks assessed    Lower Extremity Assessment Lower Extremity Assessment: Generalized weakness    Cervical / Trunk Assessment Cervical /  Trunk Assessment: Normal  Communication   Communication: Prefers language other than English  Cognition Arousal/Alertness: Awake/alert Behavior During Therapy: Flat affect Overall Cognitive Status: No family/caregiver present to determine baseline cognitive functioning                                 General Comments: pt looks very down during session        General Comments General comments (skin integrity, edema, etc.): BP supine was 105/70, sitting 86/59, standing 76/50    Exercises     Assessment/Plan    PT Assessment    PT Problem List         PT Treatment Interventions      PT Goals (Current goals can be found in the Care Plan section)  Acute Rehab PT Goals Patient Stated Goal: none reported    Frequency Min 3X/week     Co-evaluation               AM-PAC PT "6 Clicks" Mobility  Outcome Measure Help needed turning from your back to your side while in a flat bed without using bedrails?: None Help needed moving from lying on your back to sitting on the side of a flat bed without using bedrails?: None Help needed moving to and from a bed to a chair (including a wheelchair)?: A Little Help needed standing up from a chair using your arms (e.g., wheelchair or bedside chair)?: A Little Help needed to walk in hospital room?: A Little Help needed climbing 3-5 steps with a railing? : A Little 6 Click Score: 20    End of Session Equipment Utilized During Treatment: Gait belt Activity Tolerance: Treatment limited secondary to medical complications (Comment) Patient left: in bed;with call bell/phone within reach;with bed alarm set;with nursing/sitter in room Nurse Communication: Mobility status;Other (comment) (BP values) PT Visit Diagnosis: Unsteadiness on feet (R26.81);Muscle weakness (generalized) (M62.81);Dizziness and giddiness (R42)    Time: [id number] PT Time Calculation (min) (ACUTE ONLY): 31 min   Charges:     PT Treatments $Therapeutic  Activity: 23-37 mins       Ramond Dial 04/05/2021, 2:49 PM  Mee Hives, PT PhD Acute Rehab Dept. Number: [facility]

## 2021-04-05 NOTE — ED Notes (Signed)
Pt has 1 incontinent BM episode, loose stools

## 2021-04-06 LAB — CBG MONITORING, ED
Glucose-Capillary: 82 mg/dL (ref 70–99)
Glucose-Capillary: 208 mg/dL — ABNORMAL HIGH (ref 70–99)
Glucose-Capillary: 212 mg/dL — ABNORMAL HIGH (ref 70–99)

## 2021-04-06 NOTE — ED Provider Notes (Signed)
Emergency Medicine Observation Re-evaluation Note  Garrett [name] is a 36 y.o. male, seen on rounds today.  Pt initially presented to the ED for complaints of Schizophrenia Currently, the patient is resting.  Physical Exam  BP 96/68 (BP Location: Left Arm)    Pulse 92    Temp 97.7 F (36.5 C) (Oral)    Resp 18    SpO2 98%  Physical Exam General: Calm and resting Cardiac: Warm and well-perfused Lungs: Even nonlabored Psych: Calm  ED Course / MDM  EKG:EKG Interpretation  Date/Time:  Friday March 04 2021 23:48:16 EST Ventricular Rate:  88 PR Interval:  144 QRS Duration: 78 QT Interval:  356 QTC Calculation: 430 R Axis:   73 Text Interpretation: Normal sinus rhythm Normal ECG When compared to prior, similar appearance. NO STEMI Confirmed by Theda Belfast ([id number]) on 03/05/2021 4:31:55 PM  I have reviewed the labs performed to date as well as medications administered while in observation.  Recent changes in the last 24 hours include no major events overnight, was evaluated by physical therapy yesterday.  Plan  Current plan is for placement by transitions of care.  Garrett [name] is not under involuntary commitment.     Milagros Loll, MD 04/06/21 [phone]

## 2021-04-07 LAB — CBG MONITORING, ED
Glucose-Capillary: 105 mg/dL — ABNORMAL HIGH (ref 70–99)
Glucose-Capillary: 167 mg/dL — ABNORMAL HIGH (ref 70–99)
Glucose-Capillary: 230 mg/dL — ABNORMAL HIGH (ref 70–99)
Glucose-Capillary: 71 mg/dL (ref 70–99)
Glucose-Capillary: 98 mg/dL (ref 70–99)

## 2021-04-07 NOTE — Progress Notes (Addendum)
Physical Therapy Treatment Patient Details Name: Garrett [name] MRN: [id number] DOB: 05/29/1985 Today's Date: 04/07/2021   History of Present Illness 36 yo male presenting to ED with  withdrawal from friends, lack of intake, not himself with possibly catatonic behavior; thought to be secondary to MDD.   Per nsg pt sustained a fall with unsteady knees early on in his admission. Current issue of hypotension, symptomatic.  PMHx: DM, medication non compliance, AMS, DKA, acute metabolic encephalopathy, MDD    PT Comments    Pt progressing towards goals, however,  continues to be limited secondary to low BP. On first stand, BP going down to 73/50. On second stand, BP at 83/55 and then 81/55, however, asymptomatic. Was able to ambulate short distance with min guard A and BP increased to 94/68. Anticipate pt will progress well as symptoms improve. Will continue to follow acutely.    Recommendations for follow up therapy are one component of a multi-disciplinary discharge planning process, led by the attending physician.  Recommendations may be updated based on patient status, additional functional criteria and insurance authorization.  Follow Up Recommendations  Other (comment) (TBD pending progression)     Assistance Recommended at Discharge Frequent or constant Supervision/Assistance  Patient can return home with the following A little help with walking and/or transfers;A little help with bathing/dressing/bathroom;Assistance with cooking/housework;Assistance with feeding;Help with stairs or ramp for entrance;Assist for transportation;Direct supervision/assist for financial management;Direct supervision/assist for medications management   Equipment Recommendations  None recommended by PT    Recommendations for Other Services       Precautions / Restrictions Precautions Precautions: Fall Precaution Comments: Watch BP Restrictions Weight Bearing Restrictions: No     Mobility  Bed  Mobility Overal bed mobility: Needs Assistance Bed Mobility: Supine to Sit     Supine to sit: Supervision          Transfers Overall transfer level: Needs assistance Equipment used: None Transfers: Sit to/from Stand Sit to Stand: Min guard           General transfer comment: Min guard for safety. On first stand, BP going down to 73/50. On second stand, BP at 83/55 and then 81/55, however, asymptomatic.    Ambulation/Gait Ambulation/Gait assistance: Min guard Gait Distance (Feet): 30 Feet Assistive device: None Gait Pattern/deviations: Step-through pattern Gait velocity: Decreased     General Gait Details: Min guard for safety. Mild unsteadiness noted, but no overt LOB noted. BP at 94/68 following short distance ambulation.   Stairs             Wheelchair Mobility    Modified Rankin (Stroke Patients Only)       Balance Overall balance assessment: Needs assistance Sitting-balance support: Feet supported Sitting balance-Leahy Scale: Good     Standing balance support: No upper extremity supported Standing balance-Leahy Scale: Fair                              Cognition Arousal/Alertness: Awake/alert Behavior During Therapy: Flat affect Overall Cognitive Status: No family/caregiver present to determine baseline cognitive functioning                                 General Comments: Very flat affect        Exercises      General Comments        Pertinent Vitals/Pain Pain Assessment: Faces Faces Pain Scale: No hurt  Home Living                          Prior Function            PT Goals (current goals can now be found in the care plan section) Acute Rehab PT Goals Patient Stated Goal: none reported PT Goal Formulation: With patient Potential to Achieve Goals: Fair Progress towards PT goals: Progressing toward goals    Frequency    Min 2X/week      PT Plan Current plan remains  appropriate    Co-evaluation              AM-PAC PT "6 Clicks" Mobility   Outcome Measure  Help needed turning from your back to your side while in a flat bed without using bedrails?: None Help needed moving from lying on your back to sitting on the side of a flat bed without using bedrails?: None Help needed moving to and from a bed to a chair (including a wheelchair)?: A Little Help needed standing up from a chair using your arms (e.g., wheelchair or bedside chair)?: A Little Help needed to walk in hospital room?: A Little Help needed climbing 3-5 steps with a railing? : A Little 6 Click Score: 20    End of Session Equipment Utilized During Treatment: Gait belt Activity Tolerance: Treatment limited secondary to medical complications (Comment) Patient left: in bed;with call bell/phone within reach;with bed alarm set;with nursing/sitter in room Nurse Communication: Mobility status;Other (comment) (BP) PT Visit Diagnosis: Unsteadiness on feet (R26.81);Muscle weakness (generalized) (M62.81);Dizziness and giddiness (R42)     Time: 1093-2355 PT Time Calculation (min) (ACUTE ONLY): 15 min  Charges:  $Therapeutic Activity: 8-22 mins                     Cindee Salt, DPT  Acute Rehabilitation Services  Pager: [phone] Office: [phone]    [name] 04/07/2021, 10:52 AM

## 2021-04-07 NOTE — ED Provider Notes (Signed)
Emergency Medicine Observation Re-evaluation Note  Monish Kalb is a 36 y.o. male, seen on rounds today.  Pt initially presented to the ED for complaints of Schizophrenia Currently, the patient is sitting in his room.  Physical Exam  BP 94/65 (BP Location: Right Arm)    Pulse 76    Temp 97.6 F (36.4 C) (Oral)    Resp 17    SpO2 97%  Physical Exam General: resting comfortably, NAD Lungs: normal WOB Psych: currently calm and resting  ED Course / MDM  EKG:EKG Interpretation  Date/Time:  Friday March 04 2021 23:48:16 EST Ventricular Rate:  88 PR Interval:  144 QRS Duration: 78 QT Interval:  356 QTC Calculation: 430 R Axis:   73 Text Interpretation: Normal sinus rhythm Normal ECG When compared to prior, similar appearance. NO STEMI Confirmed by Theda Belfast ([id number]) on 03/05/2021 4:31:55 PM  I have reviewed the labs performed to date as well as medications administered while in observation.  Recent changes in the last 24 hours include none.  Plan  Current plan is for placement per TOC.  Jamorion Bora is not under involuntary commitment.     Rozelle Logan, [place] 04/07/21 [phone]

## 2021-04-07 NOTE — ED Notes (Signed)
Pt was ambulated to bathroom and back without incident. Pt took nighttime meds pleasantly without incident.

## 2021-04-07 NOTE — Progress Notes (Signed)
Disability application still pending.

## 2021-04-08 LAB — CBG MONITORING, ED
Glucose-Capillary: 79 mg/dL (ref 70–99)
Glucose-Capillary: 130 mg/dL — ABNORMAL HIGH (ref 70–99)
Glucose-Capillary: 134 mg/dL — ABNORMAL HIGH (ref 70–99)

## 2021-04-09 LAB — CBG MONITORING, ED
Glucose-Capillary: 84 mg/dL (ref 70–99)
Glucose-Capillary: 151 mg/dL — ABNORMAL HIGH (ref 70–99)

## 2021-04-09 NOTE — ED Provider Notes (Signed)
Emergency Medicine Observation Re-evaluation Note  Garrett [name] is a 36 y.o. male, seen on rounds today.  Pt initially presented to the ED for complaints of Schizophrenia Currently, the patient is calm, no distress.  Physical Exam  BP 98/70 (BP Location: Right Arm)    Pulse 94    Temp 98.5 F (36.9 C) (Oral)    Resp 16    SpO2 100%  Physical Exam General: No distress Cardiac: Regular rate and rhythm Lungs: No increased work of breathing Psych: Calm  ED Course / MDM  EKG:EKG Interpretation  Date/Time:  Friday March 04 2021 23:48:16 EST Ventricular Rate:  88 PR Interval:  144 QRS Duration: 78 QT Interval:  356 QTC Calculation: 430 R Axis:   73 Text Interpretation: Normal sinus rhythm Normal ECG When compared to prior, similar appearance. NO STEMI Confirmed by Theda Belfast ([id number]) on 03/05/2021 4:31:55 PM  I have reviewed the labs performed to date as well as medications administered while in observation.  Recent changes in the last 24 hours include none.  Plan  Current plan is for assessment of disability and placement.  Garrett [name] is not under involuntary commitment.     Gerhard Munch, MD 04/09/21 1429

## 2021-04-10 LAB — CBG MONITORING, ED
Glucose-Capillary: 105 mg/dL — ABNORMAL HIGH (ref 70–99)
Glucose-Capillary: 163 mg/dL — ABNORMAL HIGH (ref 70–99)
Glucose-Capillary: 119 mg/dL — ABNORMAL HIGH (ref 70–99)
Glucose-Capillary: 142 mg/dL — ABNORMAL HIGH (ref 70–99)

## 2021-04-10 NOTE — ED Provider Notes (Signed)
Emergency Medicine Observation Re-evaluation Note  Bray Alcalde is a 36 y.o. male, seen on rounds today.  Pt initially presented to the ED for complaints of Schizophrenia Currently, the patient is resting comfortably.  Physical Exam  BP 95/72 (BP Location: Right Arm)    Pulse 94    Temp (!) 97.4 F (36.3 C) (Oral)    Resp 16    SpO2 100%  Physical Exam General: No distress Cardiac: Regular rate and rhythm Lungs: No increased work of breathing Psych: Calm  ED Course / MDM  EKG:EKG Interpretation  Date/Time:  Friday March 04 2021 23:48:16 EST Ventricular Rate:  88 PR Interval:  144 QRS Duration: 78 QT Interval:  356 QTC Calculation: 430 R Axis:   73 Text Interpretation: Normal sinus rhythm Normal ECG When compared to prior, similar appearance. NO STEMI Confirmed by Theda Belfast ([id number]) on 03/05/2021 4:31:55 PM  I have reviewed the labs performed to date as well as medications administered while in observation.  Recent changes in the last 24 hours include none.  Plan  Current plan is for behavioral placement.  Eilam Schwenke is not under involuntary commitment.     Gerhard Munch, MD 04/10/21 [phone]

## 2021-04-10 NOTE — ED Notes (Signed)
Lunch delivered. 

## 2021-04-10 NOTE — ED Notes (Signed)
Pt showered, cooperative with care

## 2021-04-10 NOTE — ED Notes (Signed)
Patient ambulated to bathroom.

## 2021-04-11 LAB — COMPREHENSIVE METABOLIC PANEL
ALT: 188 U/L — ABNORMAL HIGH (ref 0–44)
AST: 105 U/L — ABNORMAL HIGH (ref 15–41)
Albumin: 3.1 g/dL — ABNORMAL LOW (ref 3.5–5.0)
Alkaline Phosphatase: 102 U/L (ref 38–126)
Anion gap: 6 (ref 5–15)
BUN: 20 mg/dL (ref 6–20)
CO2: 25 mmol/L (ref 22–32)
Calcium: 8.6 mg/dL — ABNORMAL LOW (ref 8.9–10.3)
Chloride: 103 mmol/L (ref 98–111)
Creatinine, Ser: 0.86 mg/dL (ref 0.61–1.24)
GFR, Estimated: >60 mL/min (ref >60)
Glucose, Bld: 187 mg/dL — ABNORMAL HIGH (ref 70–99)
Potassium: 4.3 mmol/L (ref 3.5–5.1)
Sodium: 134 mmol/L — ABNORMAL LOW (ref 135–145)
Total Bilirubin: 0.2 mg/dL — ABNORMAL LOW (ref 0.3–1.2)
Total Protein: 7.2 g/dL (ref 6.5–8.1)

## 2021-04-11 LAB — CBC WITH DIFFERENTIAL/PLATELET
Abs Immature Granulocytes: 0.02 10*3/uL (ref 0.00–0.07)
Basophils Absolute: 0 10*3/uL (ref 0.0–0.1)
Basophils Relative: 1 %
Eosinophils Absolute: 0.3 10*3/uL (ref 0.0–0.5)
Eosinophils Relative: 5 %
HCT: 33.9 % — ABNORMAL LOW (ref 39.0–52.0)
Hemoglobin: 10.8 g/dL — ABNORMAL LOW (ref 13.0–17.0)
Immature Granulocytes: 0 %
Lymphocytes Relative: 39 %
Lymphs Abs: 2.4 10*3/uL (ref 0.7–4.0)
MCH: 31.1 pg (ref 26.0–34.0)
MCHC: 31.9 g/dL (ref 30.0–36.0)
MCV: 97.7 fL (ref 80.0–100.0)
Monocytes Absolute: 0.9 10*3/uL (ref 0.1–1.0)
Monocytes Relative: 15 %
Neutro Abs: 2.4 10*3/uL (ref 1.7–7.7)
Neutrophils Relative %: 40 %
Platelets: 166 10*3/uL (ref 150–400)
RBC: 3.47 MIL/uL — ABNORMAL LOW (ref 4.22–5.81)
RDW: 12 % (ref 11.5–15.5)
WBC: 6 10*3/uL (ref 4.0–10.5)
nRBC: 0 % (ref 0.0–0.2)

## 2021-04-11 LAB — CBG MONITORING, ED
Glucose-Capillary: 93 mg/dL (ref 70–99)
Glucose-Capillary: 150 mg/dL — ABNORMAL HIGH (ref 70–99)
Glucose-Capillary: 88 mg/dL (ref 70–99)

## 2021-04-11 LAB — RESP PANEL BY RT-PCR (FLU A&B, COVID) ARPGX2
Influenza A by PCR: NEGATIVE
Influenza B by PCR: NEGATIVE
SARS Coronavirus 2 by RT PCR: NEGATIVE

## 2021-04-11 LAB — MAGNESIUM: Magnesium: 2 mg/dL (ref 1.7–2.4)

## 2021-04-11 MED ORDER — LACTATED RINGERS IV BOLUS
1000.0000 mL | Freq: Once | INTRAVENOUS | Status: AC
  Administered 2021-04-11: 1000 mL via INTRAVENOUS

## 2021-04-11 MED ORDER — SODIUM CHLORIDE 0.9 % IV BOLUS
1000.0000 mL | Freq: Once | INTRAVENOUS | Status: AC
  Administered 2021-04-11: 1000 mL via INTRAVENOUS

## 2021-04-11 MED ORDER — SODIUM CHLORIDE 0.9 % IV BOLUS
500.0000 mL | Freq: Once | INTRAVENOUS | Status: AC
  Administered 2021-04-11: 500 mL via INTRAVENOUS

## 2021-04-11 NOTE — ED Notes (Signed)
Patient required labs and IV start, this RN started IV and drew labs using video translator. Patient remained calm throughout encounter and had no questions about plan of care. Patient reports a little lower back pain at this time but denied medication stating that it did not hurt that bad. Patient resting at this time.

## 2021-04-11 NOTE — ED Notes (Signed)
Pt given 120 ml of orange juice and graham crackers for CBG 88.

## 2021-04-11 NOTE — ED Provider Notes (Signed)
Emergency Medicine Observation Re-evaluation Note  Garrett [name] is a 36 y.o. male, seen on rounds today.  Pt initially presented to the ED for complaints of Schizophrenia Currently, the patient is awake, resting comfortably in bed.  Physical Exam  BP 107/72 (BP Location: Left Arm)    Pulse 83    Temp (!) 97.5 F (36.4 C) (Oral)    Resp 16    SpO2 97%  Physical Exam General: Awake Lungs: Respirations even and unlabored Psych: Calm and cooperative  ED Course / MDM  EKG:EKG Interpretation  Date/Time:  Friday March 04 2021 23:48:16 EST Ventricular Rate:  88 PR Interval:  144 QRS Duration: 78 QT Interval:  356 QTC Calculation: 430 R Axis:   73 Text Interpretation: Normal sinus rhythm Normal ECG When compared to prior, similar appearance. NO STEMI Confirmed by Theda Belfast ([id number]) on 03/05/2021 4:31:55 PM  I have reviewed the labs performed to date as well as medications administered while in observation.  Recent changes in the last 24 hours include pending placement.  Plan  Current plan is for reassess orthostatic vitals.  Chart review from January 3 and January 5 for patient was seen by PT and found to be orthostatic.  Joshual Lamorte is not under involuntary commitment.  Patient is ambulatory however noted to have BP 50s/30s with standing today. Discussed with Dr. Rodena Medin, ER attending, will order repeat labs and fluids as well as COVID test to further evaluate.   Care signed out to oncoming provider pending repeat labs, fluid bolus, repeat orthostatic VS, possible admission if BP remains low.     Jeannie Fend, PA-C 04/11/21 1503    Wynetta Fines, MD 04/12/21 [phone]

## 2021-04-11 NOTE — Progress Notes (Incomplete)
°   04/11/21 1352 04/11/21 1358  Orthostatic Lying   BP- Lying (!) 87/58 98/64  Orthostatic Sitting  BP- Sitting (!) 76/54 (!) 85/55  Orthostatic Standing at 0 minutes  BP- Standing at 0 minutes (!) 55/38 (!) 59/29

## 2021-04-11 NOTE — ED Notes (Signed)
Pt is ambulating well to the bathroom without assistance

## 2021-04-11 NOTE — ED Provider Notes (Signed)
°  Physical Exam  BP (!) 74/54 (BP Location: Left Arm)    Pulse 92    Temp 97.8 F (36.6 C) (Oral)    Resp 16    SpO2 (!) 87%   Physical Exam Vitals and nursing note reviewed.  Constitutional:      General: He is not in acute distress.    Appearance: He is well-developed.  HENT:     Head: Normocephalic and atraumatic.  Eyes:     Conjunctiva/sclera: Conjunctivae normal.  Cardiovascular:     Rate and Rhythm: Normal rate and regular rhythm.     Heart sounds: No murmur heard. Pulmonary:     Effort: Pulmonary effort is normal. No respiratory distress.     Breath sounds: Normal breath sounds.  Musculoskeletal:        General: No swelling.     Cervical back: Neck supple.  Skin:    General: Skin is warm and dry.     Capillary Refill: Capillary refill takes less than 2 seconds.  Neurological:     Mental Status: He is alert.  Psychiatric:        Mood and Affect: Mood normal.    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  CMP with mild hyponatremia (Na 134), otherwise  with no significant electrolyte abnormalities. Mild AST and ALT elevations that are slightly increased from prior.  Patient received 2 L LR with no significant improvement in orthostatic hypotension.  He continues to endorse dizziness when standing, but is asymptomatic at rest.  He is able to ambulate to the bathroom without difficulty despite his dizziness.  He denies any chest pain or shortness of breath.  Patient says he has been eating well, but his dinner is practically untouched.  He was also hypoglycemic earlier and the nurse gave him orange juice to help improve his blood sugar.  Poor p.o. intake and dehydration are likely contributing to patient's symptoms.  Given persistent  orthostatic hypotension, we will order an additional 500 cc of fluids and obtain echocardiogram in the morning to evaluate for any valvular disease that may be causing patient's symptoms. Handoff was given to overnight provider.    Janese Banks, MD 04/12/21 Jorje Guild    Blane Ohara, MD 04/15/21 1318

## 2021-04-11 NOTE — Progress Notes (Addendum)
Physical Therapy Treatment Patient Details Name: Garrett [name] MRN: [id number] DOB: 1986-03-24 Today's Date: 04/11/2021   History of Present Illness 36 yo male presenting to ED with  withdrawal from friends, lack of intake, not himself with possibly catatonic behavior; thought to be secondary to MDD.   Per nsg pt sustained a fall with unsteady knees early on in his admission. Current issue of hypotension, symptomatic.  PMHx: DM, medication non compliance, AMS, DKA, acute metabolic encephalopathy, MDD    PT Comments    Pt with slow progression towards goals. Limited this session secondary to positive orthostatics. Pt symptomatic throughout and demonstrating dizziness and bilateral LE weakness. Unable to attempt ambulation this session. Was able to perform supine HEP. Anticipate pt will progress well once BP improves. Will continue to follow acutely.    04/11/21 1352 04/11/21 1358  Orthostatic Lying   BP- Lying (!) 87/58 98/64  Orthostatic Sitting  BP- Sitting (!) 76/54 (!) 85/55  Orthostatic Standing at 0 minutes  BP- Standing at 0 minutes (!) 55/38 (!) 59/29      Recommendations for follow up therapy are one component of a multi-disciplinary discharge planning process, led by the attending physician.  Recommendations may be updated based on patient status, additional functional criteria and insurance authorization.  Follow Up Recommendations  Other (comment) (TBD pending progression)     Assistance Recommended at Discharge Frequent or constant Supervision/Assistance  Patient can return home with the following A little help with walking and/or transfers;A little help with bathing/dressing/bathroom;Assistance with cooking/housework;Assistance with feeding;Help with stairs or ramp for entrance;Assist for transportation;Direct supervision/assist for financial management;Direct supervision/assist for medications management   Equipment Recommendations  None recommended by PT    Recommendations  for Other Services       Precautions / Restrictions Precautions Precautions: Fall Precaution Comments: Watch BP Restrictions Weight Bearing Restrictions: No     Mobility  Bed Mobility Overal bed mobility: Needs Assistance Bed Mobility: Sit to Supine;Supine to Sit     Supine to sit: Supervision Sit to supine: Supervision   General bed mobility comments: Supervision for safety.    Transfers Overall transfer level: Needs assistance Equipment used: None Transfers: Sit to/from Stand Sit to Stand: Min guard           General transfer comment: Min guard for safety. Stood X2, however, pt became very symptomatic and beccame unsteady and required seated rest. Pt's BP dropping to 55/38 and then 59/29 following second stand. Further mobility deferred.    Ambulation/Gait                   Stairs             Wheelchair Mobility    Modified Rankin (Stroke Patients Only)       Balance Overall balance assessment: Needs assistance Sitting-balance support: Feet supported Sitting balance-Leahy Scale: Good     Standing balance support: No upper extremity supported Standing balance-Leahy Scale: Fair                              Cognition Arousal/Alertness: Awake/alert Behavior During Therapy: Flat affect Overall Cognitive Status: No family/caregiver present to determine baseline cognitive functioning                                 General Comments: Very flat affect        Exercises General Exercises -  Lower Extremity Ankle Circles/Pumps: AROM;Both;10 reps Heel Slides: AROM;Both;10 reps Straight Leg Raises: AROM;Both;5 reps    General Comments        Pertinent Vitals/Pain Pain Assessment: No/denies pain Faces Pain Scale: No hurt    Home Living                          Prior Function            PT Goals (current goals can now be found in the care plan section) Acute Rehab PT Goals Patient Stated  Goal: none reported PT Goal Formulation: With patient Potential to Achieve Goals: Fair Progress towards PT goals: Not progressing toward goals - comment (limited by BP)    Frequency    Min 2X/week      PT Plan Current plan remains appropriate    Co-evaluation              AM-PAC PT "6 Clicks" Mobility   Outcome Measure  Help needed turning from your back to your side while in a flat bed without using bedrails?: None Help needed moving from lying on your back to sitting on the side of a flat bed without using bedrails?: None Help needed moving to and from a bed to a chair (including a wheelchair)?: A Little Help needed standing up from a chair using your arms (e.g., wheelchair or bedside chair)?: A Little Help needed to walk in hospital room?: A Lot Help needed climbing 3-5 steps with a railing? : Total 6 Click Score: 17    End of Session Equipment Utilized During Treatment: Gait belt Activity Tolerance: Treatment limited secondary to medical complications (Comment) Patient left: in bed;with call bell/phone within reach;with bed alarm set;with nursing/sitter in room Nurse Communication: Mobility status;Other (comment) (BP) PT Visit Diagnosis: Unsteadiness on feet (R26.81);Muscle weakness (generalized) (M62.81);Dizziness and giddiness (R42)     Time: 0277-4128 PT Time Calculation (min) (ACUTE ONLY): 13 min  Charges:  $Therapeutic Activity: 8-22 mins                     Cindee Salt, DPT  Acute Rehabilitation Services  Pager: [phone] Office: [phone]    [name] 04/11/2021, 9:14 PM

## 2021-04-12 ENCOUNTER — Emergency Department (HOSPITAL_BASED_OUTPATIENT_CLINIC_OR_DEPARTMENT_OTHER): Payer: Medicaid Other

## 2021-04-12 DIAGNOSIS — R55 Syncope and collapse: Secondary | ICD-10-CM

## 2021-04-12 LAB — CBC WITH DIFFERENTIAL/PLATELET
Abs Immature Granulocytes: 0.02 10*3/uL (ref 0.00–0.07)
Basophils Absolute: 0 10*3/uL (ref 0.0–0.1)
Basophils Relative: 0 %
Eosinophils Absolute: 0.2 10*3/uL (ref 0.0–0.5)
Eosinophils Relative: 4 %
HCT: 33.4 % — ABNORMAL LOW (ref 39.0–52.0)
Hemoglobin: 10.8 g/dL — ABNORMAL LOW (ref 13.0–17.0)
Immature Granulocytes: 0 %
Lymphocytes Relative: 38 %
Lymphs Abs: 2.1 10*3/uL (ref 0.7–4.0)
MCH: 31.8 pg (ref 26.0–34.0)
MCHC: 32.3 g/dL (ref 30.0–36.0)
MCV: 98.2 fL (ref 80.0–100.0)
Monocytes Absolute: 0.7 10*3/uL (ref 0.1–1.0)
Monocytes Relative: 13 %
Neutro Abs: 2.4 10*3/uL (ref 1.7–7.7)
Neutrophils Relative %: 45 %
Platelets: 149 10*3/uL — ABNORMAL LOW (ref 150–400)
RBC: 3.4 MIL/uL — ABNORMAL LOW (ref 4.22–5.81)
RDW: 12 % (ref 11.5–15.5)
WBC: 5.6 10*3/uL (ref 4.0–10.5)
nRBC: 0 % (ref 0.0–0.2)

## 2021-04-12 LAB — CBG MONITORING, ED
Glucose-Capillary: 175 mg/dL — ABNORMAL HIGH (ref 70–99)
Glucose-Capillary: 106 mg/dL — ABNORMAL HIGH (ref 70–99)

## 2021-04-12 LAB — ECHOCARDIOGRAM COMPLETE
AV Mean grad: 3 mmHg
AV Peak grad: 4.4 mmHg
Ao pk vel: 1.05 m/s
Area-P 1/2: 4.63 cm2

## 2021-04-12 LAB — BASIC METABOLIC PANEL
Anion gap: 10 (ref 5–15)
BUN: 15 mg/dL (ref 6–20)
CO2: 26 mmol/L (ref 22–32)
Calcium: 8.4 mg/dL — ABNORMAL LOW (ref 8.9–10.3)
Chloride: 101 mmol/L (ref 98–111)
Creatinine, Ser: 0.92 mg/dL (ref 0.61–1.24)
GFR, Estimated: >60 mL/min (ref >60)
Glucose, Bld: 239 mg/dL — ABNORMAL HIGH (ref 70–99)
Potassium: 4.2 mmol/L (ref 3.5–5.1)
Sodium: 137 mmol/L (ref 135–145)

## 2021-04-12 MED ORDER — LACTATED RINGERS IV BOLUS
1000.0000 mL | Freq: Once | INTRAVENOUS | Status: AC
  Administered 2021-04-12: 1000 mL via INTRAVENOUS

## 2021-04-12 NOTE — ED Provider Notes (Addendum)
Emergency Medicine Observation Re-evaluation Note  Garrett [name] is a 36 y.o. male, seen on rounds today.  Pt initially presented to the ED for complaints of Schizophrenia Currently, the patient is calm, in no distress, Echocardiogram being performed echo.  Physical Exam  BP 107/69 (BP Location: Left Arm)    Pulse 83    Temp 98 F (36.7 C) (Oral)    Resp 15    SpO2 95%  Physical Exam General: No distress Cardiac: Regular rate and rhythm Lungs: No increased work of breathing Psych: Calm  ED Course / MDM  EKG:EKG Interpretation  Date/Time:  Friday March 04 2021 23:48:16 EST Ventricular Rate:  88 PR Interval:  144 QRS Duration: 78 QT Interval:  356 QTC Calculation: 430 R Axis:   73 Text Interpretation: Normal sinus rhythm Normal ECG When compared to prior, similar appearance. NO STEMI Confirmed by Theda Belfast ([id number]) on 03/05/2021 4:31:55 PM  I have reviewed the labs performed to date as well as medications administered while in observation.  Recent changes in the last 24 hours include well documented episode of hypotension, with orthostasis, fluid resuscitation without substantial symptomatic change, but with mild persistent orthostatic hypotension.  Ultrasound being performed, labs performed yesterday, essentially reassuring aside from mild elevation in LFT.  Plan  Current plan is for placement, and ongoing monitoring of his fluid status.  Garrett [name] is not under involuntary commitment.     Gerhard Munch, MD 04/12/21 1014  10:51 AM Patient stumbled slightly, had minimal contact with the door frame, but on evaluation, per nursing, crew the patient has staples in place from a prior accident.  These were removed by myself, after checking notes, saying that it is appropriate timing.  Please see independent documentation for staple removal.    Gerhard Munch, MD 04/12/21 1051

## 2021-04-12 NOTE — ED Notes (Addendum)
Staples noted to be in pt's head still. Notified EDP that per notes, they were supposed to have been removed on the 1st or 3rd

## 2021-04-12 NOTE — ED Notes (Signed)
Pt maintaining IV fluids, not messing with the line or tubing. Pt w/ sitter, no needs expressed at this time.

## 2021-04-12 NOTE — ED Provider Notes (Signed)
.  Suture Removal  Date/Time: 04/12/2021 10:52 AM Performed by: Gerhard Munch, MD Authorized by: Gerhard Munch, MD   Consent:    Consent obtained:  Verbal   Consent given by:  Patient   Risks, benefits, and alternatives were discussed: yes     Risks discussed:  Bleeding and pain Universal protocol:    Procedure explained and questions answered to patient or proxy's satisfaction: yes     Required blood products, implants, devices, and special equipment available: yes     Site/side marked: yes     Immediately prior to procedure, a time out was called: yes     Patient identity confirmed:  Verbally with patient Location:    Location:  Head/neck   Head/neck location:  Scalp Procedure details:    Wound appearance:  No signs of infection, good wound healing and clean   Number of staples removed:  4 Post-procedure details:    Post-removal:  No dressing applied   Procedure completion:  Julieanne Manson, MD 04/12/21 1052

## 2021-04-12 NOTE — ED Notes (Signed)
EDP removed staples; pt tolerated well.

## 2021-04-12 NOTE — Progress Notes (Signed)
Social security is requesting patients permanent residence card and will be sending paperwork to the hospital for patients signature. CSW provided the servant center with the hospital address.

## 2021-04-12 NOTE — ED Notes (Signed)
Pt hypotensive. BP 81/59 map - 61. Pt not symptomatic. Pt lying in bed resting comfortably. Dr. Stevie Kern notified.

## 2021-04-13 LAB — CBG MONITORING, ED
Glucose-Capillary: 57 mg/dL — ABNORMAL LOW (ref 70–99)
Glucose-Capillary: 146 mg/dL — ABNORMAL HIGH (ref 70–99)
Glucose-Capillary: 99 mg/dL (ref 70–99)
Glucose-Capillary: 159 mg/dL — ABNORMAL HIGH (ref 70–99)
Glucose-Capillary: 177 mg/dL — ABNORMAL HIGH (ref 70–99)

## 2021-04-13 NOTE — ED Notes (Signed)
CBG rechecked by NT

## 2021-04-13 NOTE — ED Notes (Signed)
Pt assisted to the bathroom. No new occurences of loose stools. Pt gait unsteady. Pt withdrawn and uninterested. Spends most shift in bed. He has been sleeping on and off throughout shift. Had an episode of low blood sugar, CBG rechecked 177 during this shift.

## 2021-04-13 NOTE — Progress Notes (Signed)
Inpatient Diabetes Program Recommendations  AACE/ADA: New Consensus Statement on Inpatient Glycemic Control (2015)  Target Ranges:  Prepandial:   less than 140 mg/dL      Peak postprandial:   less than 180 mg/dL (1-2 hours)      Critically ill patients:  140 - 180 mg/dL   Lab Results  Component Value Date   GLUCAP 146 (H) 04/13/2021   HGBA1C 12.9 (A) 05/26/2020    Latest Reference Range & Units 04/12/21 09:44 04/12/21 12:08 04/13/21 07:59 04/13/21 08:44  Glucose-Capillary 70 - 99 mg/dL 812 (H) 751 (H) 57 (L) 146 (H)  (H): Data is abnormally high (L): Data is abnormally low  Review of Glycemic Control   Current orders for Inpatient glycemic control:   Inpatient Diabetes Program Recommendations:   Noted that patient had a low fasting blood sugar this am. Recommend decreasing Semglee to 36 units daily. Continue other insulin orders.   Smith Mince RN BSN CDE Diabetes Coordinator Pager: [phone]  8am-5pm

## 2021-04-13 NOTE — ED Notes (Signed)
PT up to BR.  

## 2021-04-13 NOTE — ED Notes (Signed)
Pt BS low Pt given peanut butter ,graham crackers . Pt at 100% of meal.

## 2021-04-13 NOTE — ED Notes (Signed)
CBG 177, Nova meter does not show crossing over

## 2021-04-14 LAB — CBC WITH DIFFERENTIAL/PLATELET
Abs Immature Granulocytes: 0.01 10*3/uL (ref 0.00–0.07)
Basophils Absolute: 0 10*3/uL (ref 0.0–0.1)
Basophils Relative: 0 %
Eosinophils Absolute: 0.3 10*3/uL (ref 0.0–0.5)
Eosinophils Relative: 5 %
HCT: 34.6 % — ABNORMAL LOW (ref 39.0–52.0)
Hemoglobin: 11.2 g/dL — ABNORMAL LOW (ref 13.0–17.0)
Immature Granulocytes: 0 %
Lymphocytes Relative: 44 %
Lymphs Abs: 2.6 10*3/uL (ref 0.7–4.0)
MCH: 31.2 pg (ref 26.0–34.0)
MCHC: 32.4 g/dL (ref 30.0–36.0)
MCV: 96.4 fL (ref 80.0–100.0)
Monocytes Absolute: 0.6 10*3/uL (ref 0.1–1.0)
Monocytes Relative: 10 %
Neutro Abs: 2.5 10*3/uL (ref 1.7–7.7)
Neutrophils Relative %: 41 %
Platelets: 163 10*3/uL (ref 150–400)
RBC: 3.59 MIL/uL — ABNORMAL LOW (ref 4.22–5.81)
RDW: 11.9 % (ref 11.5–15.5)
WBC: 6 10*3/uL (ref 4.0–10.5)
nRBC: 0 % (ref 0.0–0.2)

## 2021-04-14 LAB — BASIC METABOLIC PANEL
Anion gap: 6 (ref 5–15)
BUN: 13 mg/dL (ref 6–20)
CO2: 27 mmol/L (ref 22–32)
Calcium: 8.9 mg/dL (ref 8.9–10.3)
Chloride: 104 mmol/L (ref 98–111)
Creatinine, Ser: 0.84 mg/dL (ref 0.61–1.24)
GFR, Estimated: >60 mL/min (ref >60)
Glucose, Bld: 88 mg/dL (ref 70–99)
Potassium: 4 mmol/L (ref 3.5–5.1)
Sodium: 137 mmol/L (ref 135–145)

## 2021-04-14 LAB — CBG MONITORING, ED
Glucose-Capillary: 79 mg/dL (ref 70–99)
Glucose-Capillary: 65 mg/dL — ABNORMAL LOW (ref 70–99)
Glucose-Capillary: 108 mg/dL — ABNORMAL HIGH (ref 70–99)
Glucose-Capillary: 89 mg/dL (ref 70–99)
Glucose-Capillary: 104 mg/dL — ABNORMAL HIGH (ref 70–99)
Glucose-Capillary: 132 mg/dL — ABNORMAL HIGH (ref 70–99)

## 2021-04-14 LAB — TSH: TSH: 2.262 u[IU]/mL (ref 0.350–4.500)

## 2021-04-14 LAB — C DIFFICILE QUICK SCREEN W PCR REFLEX
C Diff antigen: NEGATIVE
C Diff interpretation: NOT DETECTED
C Diff toxin: NEGATIVE

## 2021-04-14 LAB — CORTISOL: Cortisol, Plasma: 3.1 ug/dL

## 2021-04-14 LAB — LACTIC ACID, PLASMA: Lactic Acid, Venous: 1.3 mmol/L (ref 0.5–1.9)

## 2021-04-14 MED ORDER — LACTATED RINGERS IV BOLUS
1000.0000 mL | Freq: Once | INTRAVENOUS | Status: AC
  Administered 2021-04-14: 1000 mL via INTRAVENOUS

## 2021-04-14 MED ORDER — METFORMIN HCL ER 500 MG PO TB24
500.0000 mg | ORAL_TABLET | Freq: Every day | ORAL | Status: DC
  Administered 2021-04-15 – 2021-05-05 (×20): 500 mg via ORAL
  Filled 2021-04-14 (×20): qty 1

## 2021-04-14 MED ORDER — INSULIN GLARGINE-YFGN 100 UNIT/ML ~~LOC~~ SOLN
30.0000 [IU] | Freq: Every day | SUBCUTANEOUS | Status: DC
  Administered 2021-04-14 – 2021-04-19 (×5): 30 [IU] via SUBCUTANEOUS
  Filled 2021-04-14 (×6): qty 0.3

## 2021-04-14 MED ORDER — LACTATED RINGERS IV BOLUS
1000.0000 mL | Freq: Once | INTRAVENOUS | Status: AC
  Administered 2021-04-15: 1000 mL via INTRAVENOUS

## 2021-04-14 MED ORDER — SODIUM CHLORIDE 0.9 % IV BOLUS
1000.0000 mL | Freq: Once | INTRAVENOUS | Status: AC
  Administered 2021-04-14: 1000 mL via INTRAVENOUS

## 2021-04-14 NOTE — ED Notes (Signed)
Pt given 2 orange juice to help with blood sugar

## 2021-04-14 NOTE — ED Notes (Signed)
CBG now @ 108

## 2021-04-14 NOTE — ED Notes (Signed)
Pt assisted by this RN to the bathroom. Stepped away to get new clothing as pt had watery BM on self. Within moments heard a loud noise from bathroom. Rn and NT [place] responded. Sitter had stepped away. On arrival pt was slumped over on toilet. Noted pt was unresponsive. Did not respond to sternal rub. Pulse present but faint. Pt breathing. Episode lasted approx 2 min. Pt then become responsive. D/t HX hypotension and hypoglycemia stat VS and CBG obtaining. VS and CBG WDL. Pt was assisted back to room. Pt c/o ongoing dizziness. Pt very weak requiring two person assistance. Legs unsteady. NO chest pain no abdominal pain. Watery stool x2. EDP Preston Fleeting called to bedside. Ordered placed. Unable to send stool sample at this time. Labs sent and IV fluids started.

## 2021-04-14 NOTE — ED Provider Notes (Signed)
Chart reviewed: Patient with psychiatric diagnosis of depression with psychosis Diabetes Diarrhea Orthostatic hypotension  Labs reviewed and normal EKG reviewed and normal  Medications reviewed:  Flomax discontinued Metformin decreased from 1000qd to 500qd  Discussed with Dr. Roosevelt Locks- medicine consult appreciated Discussed with Social Work, Raina Mina, LCSW     Pattricia Boss, MD 04/14/21 [phone]

## 2021-04-14 NOTE — ED Notes (Signed)
Pt ambulatory to the bathroom and back to room. IVF complete. PIV remains in place, no redness or edema noted to site. Will continue to monitor. No acute changes noted.

## 2021-04-14 NOTE — Progress Notes (Addendum)
I was asked by ED physician to evaluate the patient for orthostatic hypotension.  Checked his blood pressure at bedside lying down 136/90 prior to sitting up 108/75 and patient does complain about feeling lightheadedness. Pulses remain the same 85 lying down and sitting up.  On physical exam, patient appears to signs of dehydration.  Patient received 2 L of IV fluid earlier, and blood pressure improved afterwards.  Plan to give another 1 L of IV bolus and then recheck orthostatic vital signs.  Lactic acid, TSH and random cortisol level sent.

## 2021-04-14 NOTE — ED Provider Notes (Signed)
He had a near syncopal episode while on the commode having diarrhea.  Nursing reports very thready pulse initially.  He is feeling better now, but was initially somewhat diaphoretic.  On exam, abdomen is soft and nontender.  His records during this hospital stay are reviewed, and it is noted that he has been having diarrhea treated with loperamide, and had a GI pathogen panel sent on 12/26.  C. difficile was not tested for, will send sample for testing.  In the meantime, will check CBC and basic metabolic panel, give IV fluids.  ECG was obtained and is unchanged from prior, no acute findings.  Hemoglobin is stable, WBC is normal.  Basic metabolic panel is normal.  Patient has maintained normal vital signs.  Tested him difficile PCR is pending.   EKG Interpretation  Date/Time:  Thursday April 14 2021 02:50:49 EST Ventricular Rate:  84 PR Interval:  148 QRS Duration: 80 QT Interval:  372 QTC Calculation: 439 R Axis:   99 Text Interpretation: Normal sinus rhythm Rightward axis Borderline ECG When compared with ECG of 12-Apr-2021 22:35, No significant change was found Confirmed by Dione Booze ([id number]) on 04/14/2021 2:55:48 AM          Dione Booze, MD 04/14/21 [phone]

## 2021-04-14 NOTE — Progress Notes (Addendum)
Physical Therapy Treatment Patient Details Name: Garrett [name] MRN: [id number] DOB: 1986-01-10 Today's Date: 04/14/2021   History of Present Illness 36 yo male presenting to ED with  withdrawal from friends, lack of intake, not himself with possibly catatonic behavior; thought to be secondary to MDD.   Per nsg pt sustained a fall with unsteady knees early on in his admission. Current issue of hypotension, symptomatic.  PMHx: DM, medication non compliance, AMS, DKA, acute metabolic encephalopathy, MDD    PT Comments    Pt progressing towards goals, however, continues to be limited secondary to + orthostatics. Pt unsteady and requiring min guard A for safety. Pt's BP in standing initially 83/54 and after marching decreased further to 80/53. Had pt return to sitting and BP at 96/68. Following initial bout of ambulation, pt became shaky and unable to tolerate BP in standing. BP in sitting at 104/64. BP after second bout of ambulation was 84/40. Pt with unresponsive episode earlier in the morning. Given current symptoms and increased risk for falls, recommending SNF level therapies at d/c to increase independence and safety; recommendations updated. Will continue to follow acutely.     04/14/21 1349 04/14/21 1351 04/14/21 1353  Orthostatic Sitting  BP- Sitting 95/67  --  104/64 (unable to tolerate standing following short distance ambulation)  Orthostatic Standing at 0 minutes  BP- Standing at 0 minutes (!) 83/54 (!) 80/53 (after marching)  --     04/14/21 1355 04/14/21 1358  Orthostatic Sitting  BP- Sitting  --  98/67  Orthostatic Standing at 0 minutes  BP- Standing at 0 minutes (!) 84/60 (Following ambulation)  --       Recommendations for follow up therapy are one component of a multi-disciplinary discharge planning process, led by the attending physician.  Recommendations may be updated based on patient status, additional functional criteria and insurance authorization.  Follow Up  Recommendations  Skilled nursing-short term rehab (<3 hours/day)     Assistance Recommended at Discharge Frequent or constant Supervision/Assistance  Patient can return home with the following A little help with walking and/or transfers;A little help with bathing/dressing/bathroom;Assistance with cooking/housework;Assistance with feeding;Help with stairs or ramp for entrance;Assist for transportation;Direct supervision/assist for financial management;Direct supervision/assist for medications management   Equipment Recommendations  None recommended by PT    Recommendations for Other Services       Precautions / Restrictions Precautions Precautions: Fall Precaution Comments: Watch BP Restrictions Weight Bearing Restrictions: No     Mobility  Bed Mobility Overal bed mobility: Modified Independent;Needs Assistance Bed Mobility: Sit to Supine;Supine to Sit     Supine to sit: Supervision Sit to supine: Supervision   General bed mobility comments: Supervision for safety.    Transfers Overall transfer level: Needs assistance Equipment used: None Transfers: Sit to/from Stand Sit to Stand: Min guard;Supervision           General transfer comment: Uppon standing patient felt a little dizzy but did not affect his balance.    Ambulation/Gait Ambulation/Gait assistance: Min guard Gait Distance (Feet): 20 Feet (X2) Assistive device: None Gait Pattern/deviations: Step-through pattern Gait velocity: Decreased Gait velocity interpretation: <1.31 ft/sec, indicative of household ambulator   General Gait Details: Min guard for safety. Unsteadiness noted, however, no overt LOB. Pt with lower BP after ambulation; see vitals flowsheet.   Stairs             Wheelchair Mobility    Modified Rankin (Stroke Patients Only)       Balance Overall balance assessment:  Needs assistance Sitting-balance support: Feet supported Sitting balance-Leahy Scale: Good     Standing  balance support: No upper extremity supported Standing balance-Leahy Scale: Fair                              Cognition Arousal/Alertness: Awake/alert Behavior During Therapy: Flat affect Overall Cognitive Status: No family/caregiver present to determine baseline cognitive functioning                                 General Comments: Very flat affect        Exercises General Exercises - Lower Extremity Long Arc Quad: AROM;Both;10 reps;Seated Hip Flexion/Marching: AROM;10 reps;Standing (Min guard A)    General Comments        Pertinent Vitals/Pain Pain Assessment: No/denies pain Breathing: normal Negative Vocalization: none Facial Expression: smiling or inexpressive Body Language: relaxed Consolability: no need to console PAINAD Score: 0    Home Living Family/patient expects to be discharged to:: Unsure                        Prior Function            PT Goals (current goals can now be found in the care plan section) Acute Rehab PT Goals Patient Stated Goal: none reported PT Goal Formulation: With patient Potential to Achieve Goals: Fair Progress towards PT goals: Progressing toward goals    Frequency    Min 2X/week      PT Plan Discharge plan needs to be updated    Co-evaluation              AM-PAC PT "6 Clicks" Mobility   Outcome Measure  Help needed turning from your back to your side while in a flat bed without using bedrails?: None Help needed moving from lying on your back to sitting on the side of a flat bed without using bedrails?: None Help needed moving to and from a bed to a chair (including a wheelchair)?: A Little Help needed standing up from a chair using your arms (e.g., wheelchair or bedside chair)?: A Little Help needed to walk in hospital room?: A Lot Help needed climbing 3-5 steps with a railing? : Total 6 Click Score: 17    End of Session Equipment Utilized During Treatment: Gait  belt Activity Tolerance: Treatment limited secondary to medical complications (Comment) (+ orthostatics) Patient left: in bed;with call bell/phone within reach (on bed in ED) Nurse Communication: Mobility status;Other (comment) (Change in BP with exercise) PT Visit Diagnosis: Unsteadiness on feet (R26.81);Muscle weakness (generalized) (M62.81);Dizziness and giddiness (R42)     Time: [id number] PT Time Calculation (min) (ACUTE ONLY): 13 min  Charges:  $Gait Training: 8-22 mins                     Reuel Derby, PT, DPT  Acute Rehabilitation Services  Pager: [phone] Office: [phone]    Rudean Hitt 04/14/2021, 4:30 PM

## 2021-04-15 ENCOUNTER — Encounter (HOSPITAL_COMMUNITY): Payer: Self-pay | Admitting: Emergency Medicine

## 2021-04-15 LAB — CBG MONITORING, ED
Glucose-Capillary: 158 mg/dL — ABNORMAL HIGH (ref 70–99)
Glucose-Capillary: 169 mg/dL — ABNORMAL HIGH (ref 70–99)

## 2021-04-15 NOTE — Progress Notes (Addendum)
CSW received a call from the director Liliane Shi [phone] with [facility] who stated that she received CSW's fax in regards to placement. Jayme stated she wants to know how the LOG would work and how the facility would get paid. Jayme stated that she is not offering a bed at this time until she gets the payment information from the hospital. CSW will reach out to [facility] management.

## 2021-04-15 NOTE — NC FL2 (Signed)
[place] MEDICAID FL2 LEVEL OF CARE SCREENING TOOL     IDENTIFICATION  Patient Name: Garrett [name] Birthdate: 1985/05/29 Sex: male Admission Date (Current Location): 03/04/2021  [facility] and [place] Number:  Herbalist and Address:  [facility] [place], [place] [address] [place], [place] [address]      Provider Number: [id number]  Attending Physician Name and Address:  Default, Provider, MD  Relative Name and Phone Number:       Current Level of Care: Hospital Recommended Level of Care: [place] Prior Approval Number:    Date Approved/Denied:   PASRR Number: Pending  Discharge Plan: SNF    Current Diagnoses: Patient Active Problem List   Diagnosis Date Noted   Altered mental status    DKA (diabetic ketoacidosis) ([place]) 03/09/2020   Diabetes mellitus, new onset ([place]) 99991111   Acute metabolic encephalopathy 99991111   Elevated LFTs 03/09/2020   Major depressive disorder, recurrent severe without psychotic features ([place]) 06/20/2017   MDD (major depressive disorder), recurrent, severe, with psychosis ([place]) 06/19/2017    Orientation RESPIRATION BLADDER Height & Weight     Self, Time, Place, Situation  Normal Continent Weight:   Height:     BEHAVIORAL SYMPTOMS/MOOD NEUROLOGICAL BOWEL NUTRITION STATUS      Incontinent Diet (Regular)  AMBULATORY STATUS COMMUNICATION OF NEEDS Skin   Extensive Assist Verbally Normal                       Personal Care Assistance Level of Assistance  Bathing, Feeding, Dressing Bathing Assistance: Limited assistance Feeding assistance: Independent Dressing Assistance: Limited assistance     Functional Limitations Info  Sight, Hearing, Speech Sight Info: Adequate Hearing Info: Adequate Speech Info: Adequate    SPECIAL CARE FACTORS FREQUENCY                       Contractures Contractures Info: Not present    Additional Factors Info  Code Status, Allergies Code Status  Info: Full Allergies Info: No known Allergies           Current Medications (04/15/2021):  This is the current hospital active medication list Current Facility-Administered Medications  Medication Dose Route Frequency Provider Last Rate Last Admin   benztropine (COGENTIN) tablet 0.5 mg  0.5 mg Oral BID Rankin, Shuvon B, NP   0.5 mg at 04/15/21 0839   escitalopram (LEXAPRO) tablet 10 mg  10 mg Oral Daily Mesner, Jason, MD   10 mg at 04/15/21 0838   ibuprofen (ADVIL) tablet 400 mg  400 mg Oral Q8H PRN Mesner, Corene Cornea, MD   400 mg at 03/10/21 1343   insulin glargine-yfgn (SEMGLEE) injection 30 Units  30 Units Subcutaneous Daily Carlisle Cater, PA-C   30 Units at 04/15/21 [id number]   lactated ringers bolus 1,000 mL  1,000 mL Intravenous Once Delora Fuel, MD   Held at 04/14/21 0251   loperamide (IMODIUM) capsule 4 mg  4 mg Oral PRN Tegeler, Gwenyth Allegra, MD   4 mg at 04/15/21 0019   LORazepam (ATIVAN) tablet 0.5 mg  0.5 mg Oral QHS Rankin, Shuvon B, NP   0.5 mg at 04/14/21 2122   metFORMIN (GLUCOPHAGE-XR) 24 hr tablet 500 mg  500 mg Oral Q breakfast Pattricia Boss, MD   500 mg at 04/15/21 [id number]   risperiDONE (RISPERDAL) tablet 2 mg  2 mg Oral BID Mesner, Corene Cornea, MD   2 mg at 04/15/21 0838   thiamine tablet 100  mg  100 mg Oral Daily Mesner, Jason, MD   100 mg at 04/15/21 [id number]   Current Outpatient Medications  Medication Sig Dispense Refill   escitalopram (LEXAPRO) 10 MG tablet Take 1 tablet (10 mg total) by mouth daily. For mood control 30 tablet 0   feeding supplement (ENSURE ENLIVE / ENSURE PLUS) LIQD Take 237 mLs by mouth 2 (two) times daily between meals. (Patient not taking: Reported on 07/01/2020) 237 mL 12   glucose blood (CONTOUR NEXT TEST) test strip Use to check blood sugar up to TID. 100 each 2   ibuprofen (ADVIL) 400 MG tablet Take 1 tablet (400 mg total) by mouth every 8 (eight) hours as needed for mild pain or moderate pain. (Patient not taking: Reported on 07/01/2020) 30 tablet 0   Insulin  Glargine (BASAGLAR KWIKPEN) 100 UNIT/ML Inject 40 Units into the skin daily. 15 mL 2   LORazepam (ATIVAN) 0.5 MG tablet Take 1 tablet (0.5 mg total) by mouth 2 (two) times daily. For anxiety (Patient not taking: No sig reported) 14 tablet 0   metFORMIN (GLUCOPHAGE-XR) 500 MG 24 hr tablet Take 2 tablets (1,000 mg total) by mouth daily with breakfast. After 1 week, increase to 4 tablets by mouth daily if tolerable. 120 tablet 2   risperiDONE (RISPERDAL) 2 MG tablet Take 1 tablet (2 mg total) by mouth 2 (two) times daily. For mood control 60 tablet 0   tamsulosin (FLOMAX) 0.4 MG CAPS capsule Take 1 capsule (0.4 mg total) by mouth daily. 30 capsule 3   thiamine 100 MG tablet Take 1 tablet (100 mg total) by mouth daily.       Discharge Medications: Please see discharge summary for a list of discharge medications.  Relevant Imaging Results:  Relevant Lab Results:   Additional Information SSN# [id number], Patient receives 30 units insulin daily  Raina Mina, LCSWA

## 2021-04-15 NOTE — ED Notes (Signed)
Dr. Wallace Cullens notified of orthostatics.  Agreed to administer 1L LR.

## 2021-04-15 NOTE — Progress Notes (Signed)
Patients PASRR number [id number] E, Level II: 30-day rehab authorization only

## 2021-04-16 LAB — CBG MONITORING, ED
Glucose-Capillary: 63 mg/dL — ABNORMAL LOW (ref 70–99)
Glucose-Capillary: 163 mg/dL — ABNORMAL HIGH (ref 70–99)
Glucose-Capillary: 105 mg/dL — ABNORMAL HIGH (ref 70–99)
Glucose-Capillary: 277 mg/dL — ABNORMAL HIGH (ref 70–99)
Glucose-Capillary: 225 mg/dL — ABNORMAL HIGH (ref 70–99)

## 2021-04-16 NOTE — ED Notes (Addendum)
CBG 63 results not being transferred.

## 2021-04-16 NOTE — ED Notes (Addendum)
CBG 277  

## 2021-04-16 NOTE — ED Provider Notes (Signed)
Emergency Medicine Observation Re-evaluation Note  Jabril Yera is a 36 y.o. male, seen on rounds today.  Pt initially presented to the ED for complaints of Weakness and Altered Mental Status Currently, the patient is resting comfortably.  Physical Exam  BP (!) 80/53 (BP Location: Right Arm) Comment: nurse notified, 2nd attemp   Pulse 94    Temp 98.8 F (37.1 C) (Oral)    Resp 18    SpO2 100%  Physical Exam General: NAD, cooperative Lungs: no respiratory distress Psych: calm, cooperative  ED Course / MDM  EKG:EKG Interpretation  Date/Time:  Thursday April 14 2021 02:50:49 EST Ventricular Rate:  84 PR Interval:  148 QRS Duration: 80 QT Interval:  372 QTC Calculation: 439 R Axis:   99 Text Interpretation: Normal sinus rhythm Rightward axis Borderline ECG When compared with ECG of 12-Apr-2021 22:35, No significant change was found Confirmed by Dione Booze ([id number]) on 04/14/2021 2:55:48 AM  I have reviewed the labs performed to date as well as medications administered while in observation.  Recent changes in the last 24 hours include reduced BP this morning prior to breakfast when waking up. Improved on repeat. HDS.   Plan  Current plan is for placement.  Carlosdaniel Latimore is not under involuntary commitment.     Tanda Rockers A, DO 04/16/21 1405

## 2021-04-16 NOTE — ED Notes (Signed)
Breakfast orders placed 

## 2021-04-16 NOTE — ED Notes (Signed)
Resting quietly since 2300 04/15/21 °

## 2021-04-17 LAB — CBG MONITORING, ED
Glucose-Capillary: 137 mg/dL — ABNORMAL HIGH (ref 70–99)
Glucose-Capillary: 192 mg/dL — ABNORMAL HIGH (ref 70–99)
Glucose-Capillary: 283 mg/dL — ABNORMAL HIGH (ref 70–99)
Glucose-Capillary: 212 mg/dL — ABNORMAL HIGH (ref 70–99)

## 2021-04-17 NOTE — ED Provider Notes (Signed)
Emergency Medicine Observation Re-evaluation Note  Garrett [name] is a 36 y.o. male, seen on rounds today.  Pt initially presented to the ED for complaints of Weakness and Altered Mental Status Currently, the patient is calm, eating a meal.  Physical Exam  BP 92/61 (BP Location: Right Arm)    Pulse 86    Temp 98.4 F (36.9 C) (Oral)    Resp 14    SpO2 95%  Physical Exam General: NAD Lungs: no respiratory distress, breathing comfortably Psych: calm, cooperative, no distress  ED Course / MDM  EKG:EKG Interpretation  Date/Time:  Thursday April 14 2021 02:50:49 EST Ventricular Rate:  84 PR Interval:  148 QRS Duration: 80 QT Interval:  372 QTC Calculation: 439 R Axis:   99 Text Interpretation: Normal sinus rhythm Rightward axis Borderline ECG When compared with ECG of 12-Apr-2021 22:35, No significant change was found Confirmed by Dione Booze ([id number]) on 04/14/2021 2:55:48 AM  I have reviewed the labs performed to date as well as medications administered while in observation.  Recent changes in the last 24 hours include none.  Plan  Current plan is for placement per SW.  Garrett [name] is not under involuntary commitment.     Tanda Rockers A, DO 04/17/21 1234

## 2021-04-17 NOTE — ED Notes (Signed)
Pt assessed for incontinence. Not incontinent at this time. Pt was offered warm blankets & declined.

## 2021-04-18 LAB — CBG MONITORING, ED
Glucose-Capillary: 156 mg/dL — ABNORMAL HIGH (ref 70–99)
Glucose-Capillary: 293 mg/dL — ABNORMAL HIGH (ref 70–99)
Glucose-Capillary: 292 mg/dL — ABNORMAL HIGH (ref 70–99)

## 2021-04-18 NOTE — Progress Notes (Signed)
CSW spoke with director Liliane Shi [phone] at [facility] assisted living. CSW explained the LOG process. Garrett [name] stated she would have to decline due to the possibility of patient being denied for disability and then they would be responsible for a disposition. Disability is still pending and this could take 3-6 months. Patient needs a payor source for long term care or assisted living.

## 2021-04-18 NOTE — ED Notes (Signed)
Patient resting. Even Resp. No distress noted at this time. Sitter at bedside. °

## 2021-04-18 NOTE — ED Provider Notes (Signed)
Emergency Medicine Observation Re-evaluation Note  Josia Eagon is a 36 y.o. male, seen on rounds today.  Pt initially presented to the ED for complaints of Weakness and Altered Mental Status Currently, the patient is resting comfortably.  Physical Exam  BP 107/64 (BP Location: Right Arm)    Pulse 83    Temp 98.1 F (36.7 C)    Resp 16    SpO2 99%  Physical Exam General: Nontoxic appearing Cardiac: Normal heart Lungs: Normal respiratory Psych: No evidence for internal responsiveness  ED Course / MDM  EKG:EKG Interpretation  Date/Time:  Thursday April 14 2021 02:50:49 EST Ventricular Rate:  84 PR Interval:  148 QRS Duration: 80 QT Interval:  372 QTC Calculation: 439 R Axis:   99 Text Interpretation: Normal sinus rhythm Rightward axis Borderline ECG When compared with ECG of 12-Apr-2021 22:35, No significant change was found Confirmed by Dione Booze ([id number]) on 04/14/2021 2:55:48 AM  I have reviewed the labs performed to date as well as medications administered while in observation.  Recent changes in the last 24 hours include cooperative, not having continue incontinence.  Plan  Current plan is for psychiatric placement.  Keena Hegel is not under IVC;  social work placement in a assisted living facility.     Mancel Bale, MD 04/18/21 [phone]

## 2021-04-18 NOTE — ED Notes (Signed)
Breakfast orders placed 

## 2021-04-18 NOTE — ED Notes (Signed)
Patient resting. Even Resp. No distress noted at this time. °

## 2021-04-19 LAB — CBG MONITORING, ED
Glucose-Capillary: 247 mg/dL — ABNORMAL HIGH (ref 70–99)
Glucose-Capillary: 288 mg/dL — ABNORMAL HIGH (ref 70–99)
Glucose-Capillary: 324 mg/dL — ABNORMAL HIGH (ref 70–99)
Glucose-Capillary: 241 mg/dL — ABNORMAL HIGH (ref 70–99)

## 2021-04-19 MED ORDER — INSULIN GLARGINE-YFGN 100 UNIT/ML ~~LOC~~ SOLN
35.0000 [IU] | Freq: Every day | SUBCUTANEOUS | Status: DC
  Administered 2021-04-20 – 2021-04-27 (×8): 35 [IU] via SUBCUTANEOUS
  Filled 2021-04-19 (×10): qty 0.35

## 2021-04-19 NOTE — ED Notes (Signed)
Lunch Ordered °

## 2021-04-19 NOTE — Progress Notes (Signed)
Inpatient Diabetes Program Recommendations  AACE/ADA: New Consensus Statement on Inpatient Glycemic Control (2015)  Target Ranges:  Prepandial:   less than 140 mg/dL      Peak postprandial:   less than 180 mg/dL (1-2 hours)      Critically ill patients:  140 - 180 mg/dL   Lab Results  Component Value Date   GLUCAP 247 (H) 04/19/2021   HGBA1C 12.9 (A) 05/26/2020    Review of Glycemic Control  Latest Reference Range & Units 04/18/21 12:40 04/18/21 18:25 04/19/21 07:45  Glucose-Capillary 70 - 99 mg/dL 494 (H) 496 (H) 759 (H)  (H): Data is abnormally high Diabetes history: Type 2 DM Outpatient Diabetes medications: Lantus 40 units QD, Metformin 1000 mg QD Current orders for Inpatient glycemic control: Semglee 30 units QD, Metformin 500 mg QD  Inpatient Diabetes Program Recommendations:    Consider adding Novolog 3 units TID (assuming patient is consuming >50% of meals).  Carb modified diet?   Thanks, Lujean Rave, MSN, RNC-OB Diabetes Coordinator [phone] (8a-5p)

## 2021-04-19 NOTE — Progress Notes (Signed)
CSW is still waiting for social security to mail important documents to the hospital for patient to sign. Disability application is still pending.

## 2021-04-19 NOTE — ED Notes (Signed)
Placed Breakfast Order 

## 2021-04-19 NOTE — ED Notes (Signed)
Patient ambulatory to the restroom.

## 2021-04-20 LAB — CBG MONITORING, ED
Glucose-Capillary: 109 mg/dL — ABNORMAL HIGH (ref 70–99)
Glucose-Capillary: 155 mg/dL — ABNORMAL HIGH (ref 70–99)
Glucose-Capillary: 453 mg/dL — ABNORMAL HIGH (ref 70–99)
Glucose-Capillary: 271 mg/dL — ABNORMAL HIGH (ref 70–99)

## 2021-04-20 NOTE — ED Notes (Signed)
Plan to recheck CBG after Pt eats to verify the 452 is accurate.

## 2021-04-20 NOTE — Progress Notes (Signed)
Physical Therapy Treatment Patient Details Name: Garrett [name] MRN: [id number] DOB: 1986/03/05 Today's Date: 04/20/2021   History of Present Illness 36 yo male presenting to ED with  withdrawal from friends, lack of intake, not himself with possibly catatonic behavior; thought to be secondary to MDD.   Per nsg pt sustained a fall with unsteady knees early on in his admission. Current issue of hypotension, symptomatic.  PMHx: DM, medication non compliance, AMS, DKA, acute metabolic encephalopathy, MDD    PT Comments    Pt progressing towards goals. Able to progress ambulation, but continues to be limited by orthostatic BPs. BP in sitting at 98/66; following first ambulation trial 75/48; returned to sitting at 93/65; following second ambulation trial at 83/53; after sitting 97/69. Question if pt may benefit from compression stockings to help with BP. Current recommendations for SNF appropriate as pt remains high fall risk. Will continue to follow acutely.    Recommendations for follow up therapy are one component of a multi-disciplinary discharge planning process, led by the attending physician.  Recommendations may be updated based on patient status, additional functional criteria and insurance authorization.  Follow Up Recommendations  Skilled nursing-short term rehab (<3 hours/day)     Assistance Recommended at Discharge Frequent or constant Supervision/Assistance  Patient can return home with the following A little help with walking and/or transfers;A little help with bathing/dressing/bathroom;Assistance with cooking/housework;Help with stairs or ramp for entrance;Assist for transportation;Direct supervision/assist for financial management;Direct supervision/assist for medications management   Equipment Recommendations  None recommended by PT    Recommendations for Other Services       Precautions / Restrictions Precautions Precautions: Fall Precaution Comments: Watch  BP Restrictions Weight Bearing Restrictions: No     Mobility  Bed Mobility Overal bed mobility: Modified Independent, Needs Assistance             General bed mobility comments: BP in sitting initially 98/66    Transfers Overall transfer level: Needs assistance Equipment used: None Transfers: Sit to/from Stand Sit to Stand: Min guard           General transfer comment: Min guard for safety. Increased sway following ambulation trials, likely secondary to hypotension.    Ambulation/Gait Ambulation/Gait assistance: Min guard Gait Distance (Feet): 40 Feet (X2) Assistive device: None Gait Pattern/deviations: Step-through pattern Gait velocity: Decreased     General Gait Details: Min guard for safety with mild unsteadiness. Performed 2 gait trials. Following first trial BP at 75/48 and after second trial BP at 83/53. RN notified.   Stairs             Wheelchair Mobility    Modified Rankin (Stroke Patients Only)       Balance Overall balance assessment: Needs assistance Sitting-balance support: Feet supported Sitting balance-Leahy Scale: Good     Standing balance support: No upper extremity supported Standing balance-Leahy Scale: Fair                              Cognition Arousal/Alertness: Awake/alert Behavior During Therapy: Flat affect Overall Cognitive Status: No family/caregiver present to determine baseline cognitive functioning                                 General Comments: Flat affect throughout        Exercises General Exercises - Lower Extremity Long Arc Quad: AROM, Both, 10 reps, Seated Other Exercises Other  Exercises: Sit<>stand X10 for LE strengthening.    General Comments General comments (skin integrity, edema, etc.): BP in sitting at 98/66; following first ambulation trial 75/48; returned to sitting at 93/65; following second ambulation trial at 83/53; after sitting 97/69.      Pertinent  Vitals/Pain Pain Assessment Pain Assessment: No/denies pain    Home Living                          Prior Function            PT Goals (current goals can now be found in the care plan section) Acute Rehab PT Goals Patient Stated Goal: none reported PT Goal Formulation: With patient Time For Goal Achievement: 05/04/21 Potential to Achieve Goals: Fair Progress towards PT goals: Progressing toward goals    Frequency    Min 1X/week      PT Plan Frequency needs to be updated    Co-evaluation              AM-PAC PT "6 Clicks" Mobility   Outcome Measure  Help needed turning from your back to your side while in a flat bed without using bedrails?: None Help needed moving from lying on your back to sitting on the side of a flat bed without using bedrails?: None Help needed moving to and from a bed to a chair (including a wheelchair)?: A Little Help needed standing up from a chair using your arms (e.g., wheelchair or bedside chair)?: A Little Help needed to walk in hospital room?: A Little Help needed climbing 3-5 steps with a railing? : Total 6 Click Score: 18    End of Session Equipment Utilized During Treatment: Gait belt Activity Tolerance: Treatment limited secondary to medical complications (Comment) (+ orthostatics) Patient left: in bed;with call bell/phone within reach;with nursing/sitter in room Nurse Communication: Mobility status;Other (comment) (+ orthostatics) PT Visit Diagnosis: Unsteadiness on feet (R26.81);Muscle weakness (generalized) (M62.81);Dizziness and giddiness (R42)     Time: 8177-1165 PT Time Calculation (min) (ACUTE ONLY): 12 min  Charges:  $Gait Training: 8-22 mins                     Cindee Salt, DPT  Acute Rehabilitation Services  Pager: [phone] Office: [phone]    [name] 04/20/2021, 11:57 AM

## 2021-04-20 NOTE — ED Notes (Signed)
Physical Therapy with pt ambulating pt .

## 2021-04-21 NOTE — Progress Notes (Signed)
Inpatient Diabetes Program Recommendations  AACE/ADA: New Consensus Statement on Inpatient Glycemic Control   Target Ranges:  Prepandial:   less than 140 mg/dL      Peak postprandial:   less than 180 mg/dL (1-2 hours)      Critically ill patients:  140 - 180 mg/dL    Latest Reference Range & Units 04/20/21 08:17 04/20/21 12:44 04/20/21 13:21 04/20/21 17:45  Glucose-Capillary 70 - 99 mg/dL 109 (H) 453 (H) 155 (H) 271 (H)   Review of Glycemic Control  Diabetes history: DM2 Outpatient Diabetes medications: Basaglar 40 units daily, Metformin XR 1000 mg QAM Current orders for Inpatient glycemic control: Semglee 35 units daily, Metformin XR 500 mg QAM  Inpatient Diabetes Program Recommendations:    Diet: Please discontinue Regular diet and order Carb Modified diet.  Insulin: Please consider ordering Novolog 3 units TID with meals for meal coverage if patient eats at least 50% of meals.  Thanks, Barnie Alderman, RN, MSN, CDE Diabetes Coordinator Inpatient Diabetes Program [phone] (Team Pager from 8am to 5pm)

## 2021-04-21 NOTE — ED Notes (Signed)
CBG 277  

## 2021-04-21 NOTE — Progress Notes (Signed)
Disability application is still pending

## 2021-04-21 NOTE — ED Provider Notes (Signed)
Emergency Medicine Observation Re-evaluation Note  Caleel Whittemore is a 36 y.o. male, seen on rounds today.  Pt initially presented to the ED for complaints of Weakness and Altered Mental Status Currently, the patient is resting.  Physical Exam  BP 104/71 (BP Location: Right Arm)    Pulse 88    Temp 98.9 F (37.2 C)    Resp 16    SpO2 99%  Physical Exam General: No distress Cardiac: Regular rate and rhythm Lungs: No increased work of breathing Psych: Calm  ED Course / MDM  EKG:EKG Interpretation  Date/Time:  Thursday April 14 2021 02:50:49 EST Ventricular Rate:  84 PR Interval:  148 QRS Duration: 80 QT Interval:  372 QTC Calculation: 439 R Axis:   99 Text Interpretation: Normal sinus rhythm Rightward axis Borderline ECG When compared with ECG of 12-Apr-2021 22:35, No significant change was found Confirmed by Delora Fuel ([id number]) on 04/14/2021 2:55:48 AM  I have reviewed the labs performed to date as well as medications administered while in observation.  Recent changes in the last 24 hours include glucose above normal, but decreased from prior.  Plan  Current plan is for placement.  Cache Hew is not under involuntary commitment.     Carmin Muskrat, MD 04/21/21 1501

## 2021-04-21 NOTE — ED Notes (Signed)
CBG 178 

## 2021-04-22 MED ORDER — INSULIN ASPART 100 UNIT/ML IJ SOLN
3.0000 [IU] | Freq: Three times a day (TID) | INTRAMUSCULAR | Status: DC
  Administered 2021-04-22 – 2021-04-27 (×10): 3 [IU] via SUBCUTANEOUS

## 2021-04-22 MED ORDER — INSULIN ASPART 100 UNIT/ML IJ SOLN
0.0000 [IU] | Freq: Three times a day (TID) | INTRAMUSCULAR | Status: DC
  Administered 2021-04-22: 3 [IU] via SUBCUTANEOUS
  Administered 2021-04-23 – 2021-04-25 (×2): 2 [IU] via SUBCUTANEOUS
  Administered 2021-04-25: 1 [IU] via SUBCUTANEOUS
  Administered 2021-04-26: 12:00:00 2 [IU] via SUBCUTANEOUS
  Administered 2021-04-28: 1 [IU] via SUBCUTANEOUS
  Administered 2021-04-29: 3 [IU] via SUBCUTANEOUS
  Administered 2021-04-29 (×2): 1 [IU] via SUBCUTANEOUS
  Administered 2021-04-30: 5 [IU] via SUBCUTANEOUS
  Administered 2021-04-30: 3 [IU] via SUBCUTANEOUS
  Administered 2021-04-30: 2 [IU] via SUBCUTANEOUS
  Administered 2021-05-01: 1 [IU] via SUBCUTANEOUS
  Administered 2021-05-01 – 2021-05-02 (×2): 2 [IU] via SUBCUTANEOUS
  Administered 2021-05-02: 1 [IU] via SUBCUTANEOUS
  Administered 2021-05-03: 2 [IU] via SUBCUTANEOUS
  Administered 2021-05-05 (×2): 1 [IU] via SUBCUTANEOUS
  Administered 2021-05-06: 2 [IU] via SUBCUTANEOUS
  Administered 2021-05-07: 3 [IU] via SUBCUTANEOUS
  Administered 2021-05-08: 2 [IU] via SUBCUTANEOUS
  Administered 2021-05-08: 1 [IU] via SUBCUTANEOUS
  Administered 2021-05-08: 2 [IU] via SUBCUTANEOUS
  Administered 2021-05-09: 3 [IU] via SUBCUTANEOUS
  Administered 2021-05-09: 2 [IU] via SUBCUTANEOUS
  Administered 2021-05-09 – 2021-05-10 (×2): 1 [IU] via SUBCUTANEOUS
  Administered 2021-05-10 – 2021-05-11 (×4): 2 [IU] via SUBCUTANEOUS
  Administered 2021-05-11 – 2021-05-12 (×2): 3 [IU] via SUBCUTANEOUS
  Administered 2021-05-12: 5 [IU] via SUBCUTANEOUS
  Administered 2021-05-12: 2 [IU] via SUBCUTANEOUS
  Administered 2021-05-13: 3 [IU] via SUBCUTANEOUS
  Administered 2021-05-13: 1 [IU] via SUBCUTANEOUS
  Administered 2021-05-13 – 2021-05-14 (×4): 2 [IU] via SUBCUTANEOUS
  Administered 2021-05-15: 1 [IU] via SUBCUTANEOUS
  Administered 2021-05-15: 2 [IU] via SUBCUTANEOUS
  Administered 2021-05-15: 1 [IU] via SUBCUTANEOUS
  Administered 2021-05-16 – 2021-05-17 (×4): 3 [IU] via SUBCUTANEOUS
  Administered 2021-05-17: 7 [IU] via SUBCUTANEOUS
  Administered 2021-05-18: 2 [IU] via SUBCUTANEOUS
  Administered 2021-05-18: 3 [IU] via SUBCUTANEOUS
  Administered 2021-05-18: 5 [IU] via SUBCUTANEOUS
  Administered 2021-05-19: 2 [IU] via SUBCUTANEOUS

## 2021-04-22 NOTE — ED Notes (Signed)
Blood glucose unable to crossover. Glucometer read 161.

## 2021-04-22 NOTE — ED Provider Notes (Signed)
Emergency Medicine Observation Re-evaluation Note  Samie Rothbauer is a 36 y.o. male, seen on rounds today.  Pt initially presented to the ED for complaints of Weakness and Altered Mental Status Currently, the patient is resting.  Physical Exam  BP 103/70 (BP Location: Left Arm)    Pulse 82    Temp 98 F (36.7 C) (Oral)    Resp 16    SpO2 97%  Physical Exam General: No distress Cardiac: Regular rate and rhythm Lungs: No increased work of breathing Psych: Calm  ED Course / MDM  EKG:EKG Interpretation  Date/Time:  Thursday April 14 2021 02:50:49 EST Ventricular Rate:  84 PR Interval:  148 QRS Duration: 80 QT Interval:  372 QTC Calculation: 439 R Axis:   99 Text Interpretation: Normal sinus rhythm Rightward axis Borderline ECG When compared with ECG of 12-Apr-2021 22:35, No significant change was found Confirmed by Dione Booze ([id number]) on 04/14/2021 2:55:48 AM  I have reviewed the labs performed to date as well as medications administered while in observation.  Recent changes in the last 24 hours include glucose above normal, but decreased from prior.  Plan  Current plan is for placement.  Keanen Mastrogiovanni is not under involuntary commitment.     Melene Plan, DO 04/22/21 [phone]

## 2021-04-23 NOTE — ED Notes (Addendum)
Blood sugar--94

## 2021-04-23 NOTE — ED Notes (Addendum)
CBG 119 

## 2021-04-23 NOTE — ED Notes (Addendum)
Blood sugar 192

## 2021-04-23 NOTE — ED Notes (Signed)
CBG 84 at this time.

## 2021-04-24 NOTE — ED Notes (Signed)
Blood sugar 83

## 2021-04-24 NOTE — ED Notes (Addendum)
Blood sugar 82.

## 2021-04-24 NOTE — ED Notes (Signed)
Bs 107, I tried to scan pt's wrist band, but it would not scan into the glucometer and neither would his stickers.

## 2021-04-24 NOTE — ED Notes (Signed)
Patient ate 20% of lunch

## 2021-04-24 NOTE — ED Notes (Signed)
Blood sugar 230

## 2021-04-25 LAB — CBG MONITORING, ED
Glucose-Capillary: 107 mg/dL — ABNORMAL HIGH (ref 70–99)
Glucose-Capillary: 119 mg/dL — ABNORMAL HIGH (ref 70–99)
Glucose-Capillary: 130 mg/dL — ABNORMAL HIGH (ref 70–99)
Glucose-Capillary: 135 mg/dL — ABNORMAL HIGH (ref 70–99)
Glucose-Capillary: 144 mg/dL — ABNORMAL HIGH (ref 70–99)
Glucose-Capillary: 146 mg/dL — ABNORMAL HIGH (ref 70–99)
Glucose-Capillary: 162 mg/dL — ABNORMAL HIGH (ref 70–99)
Glucose-Capillary: 178 mg/dL — ABNORMAL HIGH (ref 70–99)
Glucose-Capillary: 192 mg/dL — ABNORMAL HIGH (ref 70–99)
Glucose-Capillary: 219 mg/dL — ABNORMAL HIGH (ref 70–99)
Glucose-Capillary: 219 mg/dL — ABNORMAL HIGH (ref 70–99)
Glucose-Capillary: 230 mg/dL — ABNORMAL HIGH (ref 70–99)
Glucose-Capillary: 277 mg/dL — ABNORMAL HIGH (ref 70–99)
Glucose-Capillary: 78 mg/dL (ref 70–99)
Glucose-Capillary: 82 mg/dL (ref 70–99)
Glucose-Capillary: 83 mg/dL (ref 70–99)
Glucose-Capillary: 84 mg/dL (ref 70–99)
Glucose-Capillary: 94 mg/dL (ref 70–99)
Glucose-Capillary: 168 mg/dL — ABNORMAL HIGH (ref 70–99)

## 2021-04-25 LAB — HEMOGLOBIN A1C
Hgb A1c MFr Bld: 5.9 % — ABNORMAL HIGH (ref 4.8–5.6)
Mean Plasma Glucose: 123 mg/dL

## 2021-04-25 NOTE — ED Notes (Signed)
CBG 130 

## 2021-04-25 NOTE — ED Notes (Signed)
Breakfast order placed ?

## 2021-04-25 NOTE — ED Notes (Signed)
Pt NAD, a/o. States all needs being met at this time.

## 2021-04-25 NOTE — ED Notes (Signed)
Pt given meal tray.

## 2021-04-25 NOTE — ED Notes (Signed)
Pt resting in bed om back with eye close. Even and Equal RR noted. NAD noted. Care plan continued

## 2021-04-26 LAB — CBG MONITORING, ED
Glucose-Capillary: 67 mg/dL — ABNORMAL LOW (ref 70–99)
Glucose-Capillary: 80 mg/dL (ref 70–99)
Glucose-Capillary: 251 mg/dL — ABNORMAL HIGH (ref 70–99)
Glucose-Capillary: 165 mg/dL — ABNORMAL HIGH (ref 70–99)
Glucose-Capillary: 62 mg/dL — ABNORMAL LOW (ref 70–99)
Glucose-Capillary: 245 mg/dL — ABNORMAL HIGH (ref 70–99)

## 2021-04-26 NOTE — ED Notes (Signed)
Pt CBG 67,pt given 8 oz of juice. RN to recheck CBG.

## 2021-04-26 NOTE — ED Notes (Signed)
Breakfast orders placed 

## 2021-04-26 NOTE — ED Notes (Signed)
Pt CBG now 80. Pt now eating meal tray and given additional juice. RN to continue to monitor.

## 2021-04-27 LAB — CBG MONITORING, ED
Glucose-Capillary: 103 mg/dL — ABNORMAL HIGH (ref 70–99)
Glucose-Capillary: 73 mg/dL (ref 70–99)
Glucose-Capillary: 181 mg/dL — ABNORMAL HIGH (ref 70–99)

## 2021-04-27 NOTE — ED Provider Notes (Signed)
Emergency Medicine Observation Re-evaluation Note  Garrett [name] is a 36 y.o. male, seen on rounds today.  Pt initially presented to the ED for complaints of Weakness and Altered Mental Status Currently, the patient is watching TV.  Physical Exam  BP 104/67    Pulse 81    Temp 97.7 F (36.5 C) (Oral)    Resp 16    SpO2 99%  Physical Exam General: No distress Lungs: Even and unlabored Psych: calm and cooperative  ED Course / MDM  EKG:EKG Interpretation  Date/Time:  Thursday April 14 2021 02:50:49 EST Ventricular Rate:  84 PR Interval:  148 QRS Duration: 80 QT Interval:  372 QTC Calculation: 439 R Axis:   99 Text Interpretation: Normal sinus rhythm Rightward axis Borderline ECG When compared with ECG of 12-Apr-2021 22:35, No significant change was found Confirmed by Delora Fuel ([id number]) on 04/14/2021 2:55:48 AM  I have reviewed the labs performed to date as well as medications administered while in observation.  Recent changes in the last 24 hours include none.  Plan  Current plan is for placement or for family to get him if/when they return from [place].  Garrett [name] is not under involuntary commitment.     Truddie Hidden, MD 04/27/21 [phone]

## 2021-04-27 NOTE — Progress Notes (Signed)
Inpatient Diabetes Program Recommendations  AACE/ADA: New Consensus Statement on Inpatient Glycemic Control (2015)  Target Ranges:  Prepandial:   less than 140 mg/dL      Peak postprandial:   less than 180 mg/dL (1-2 hours)      Critically ill patients:  140 - 180 mg/dL   Lab Results  Component Value Date   GLUCAP 73 04/27/2021   HGBA1C 5.9 (H) 04/24/2021    Review of Glycemic Control  Diabetes history: DM2 Outpatient Diabetes medications: Basaglar 40 units QD, metformin 1000 mg BID Current orders for Inpatient glycemic control: Semglee 35 units QD, Novolog 0-9 units TID + 3 units TID, metformin 500 mg QD  HgbA1C - 5.9% FBSs trending on the low side. Had hypo of 62 yesterday before dinner meal  Inpatient Diabetes Program Recommendations:    Consider reducing Semglee to 33 units QD  Will continue to follow.  Thank you. Ailene Ards, RD, LDN, CDE Inpatient Diabetes Coordinator [phone]

## 2021-04-27 NOTE — ED Notes (Signed)
Dinner tray delivered to pt at this time.  °

## 2021-04-27 NOTE — ED Notes (Signed)
Patient ambulating in hall with PT. Tolerating well. 

## 2021-04-27 NOTE — Progress Notes (Signed)
CSW still has not received any paperwork from social security. CSW emailed Helmut Muster, Interior and spatial designer of disability at the [facility] to find out when the paperwork is going to be mailed to the ED for patient signatures.

## 2021-04-27 NOTE — Progress Notes (Signed)
Physical Therapy Treatment Patient Details Name: Garrett [name] MRN: [id number] DOB: 20-Apr-1985 Today's Date: 04/27/2021   History of Present Illness 36 yo male presenting to ED with  withdrawal from friends, lack of intake, not himself with possibly catatonic behavior; thought to be secondary to MDD.   Per nsg pt sustained a fall with unsteady knees early on in his admission. Current issue of hypotension, symptomatic.  PMHx: DM, medication non compliance, AMS, DKA, acute metabolic encephalopathy, MDD    PT Comments    Pt progressing towards goals. Pt requiring min guard up to min A for mobility tasks. Pt with LOB upon return to room initially and required min A. Pt orthostatic following each ambulation trial and reporting increased dizziness, so further mobility limited. Current recommendations appropriate. Will continue to follow acutely.    04/27/21 1435 04/27/21 1440  Orthostatic Sitting  BP- Sitting 95/71 94/70  Orthostatic Standing at 0 minutes  BP- Standing at 0 minutes (!) 64/49 (after ambulate) (!) 74/54 (after ambulation)      Recommendations for follow up therapy are one component of a multi-disciplinary discharge planning process, led by the attending physician.  Recommendations may be updated based on patient status, additional functional criteria and insurance authorization.  Follow Up Recommendations  Skilled nursing-short term rehab (<3 hours/day)     Assistance Recommended at Discharge Frequent or constant Supervision/Assistance  Patient can return home with the following A little help with walking and/or transfers;A little help with bathing/dressing/bathroom;Assistance with cooking/housework;Help with stairs or ramp for entrance;Assist for transportation;Direct supervision/assist for financial management;Direct supervision/assist for medications management   Equipment Recommendations  None recommended by PT    Recommendations for Other Services       Precautions /  Restrictions Precautions Precautions: Fall Precaution Comments: Watch BP Restrictions Weight Bearing Restrictions: No     Mobility  Bed Mobility Overal bed mobility: Modified Independent                  Transfers Overall transfer level: Needs assistance Equipment used: None Transfers: Sit to/from Stand Sit to Stand: Min guard           General transfer comment: Min guard for safety. Increased sway following ambulation trials and LOB X1 requiring min A, likely secondary to hypotension.    Ambulation/Gait Ambulation/Gait assistance: Min guard, Min assist   Assistive device: None Gait Pattern/deviations: Step-through pattern Gait velocity: Decreased     General Gait Details: Min guard for safety with increased instability. Pt with LOB upon return to room and requiring min A for steadying.   Stairs             Wheelchair Mobility    Modified Rankin (Stroke Patients Only)       Balance Overall balance assessment: Needs assistance Sitting-balance support: Feet supported Sitting balance-Leahy Scale: Good     Standing balance support: No upper extremity supported Standing balance-Leahy Scale: Fair                              Cognition Arousal/Alertness: Awake/alert Behavior During Therapy: Flat affect Overall Cognitive Status: No family/caregiver present to determine baseline cognitive functioning                                 General Comments: Flat affect throughout        Exercises      General Comments General comments (  skin integrity, edema, etc.): BP in sitting at 95/71; following first ambulation trial 64/49; returned to sitting at 94/70; following second ambulation trial at 74/54      Pertinent Vitals/Pain Pain Assessment Pain Assessment: No/denies pain    Home Living                          Prior Function            PT Goals (current goals can now be found in the care plan  section) Acute Rehab PT Goals Patient Stated Goal: none reported PT Goal Formulation: With patient Time For Goal Achievement: 05/04/21 Potential to Achieve Goals: Fair Progress towards PT goals: Progressing toward goals    Frequency    Min 1X/week      PT Plan Current plan remains appropriate    Co-evaluation              AM-PAC PT "6 Clicks" Mobility   Outcome Measure  Help needed turning from your back to your side while in a flat bed without using bedrails?: None Help needed moving from lying on your back to sitting on the side of a flat bed without using bedrails?: None Help needed moving to and from a bed to a chair (including a wheelchair)?: A Little Help needed standing up from a chair using your arms (e.g., wheelchair or bedside chair)?: A Little Help needed to walk in hospital room?: A Little Help needed climbing 3-5 steps with a railing? : Total 6 Click Score: 18    End of Session Equipment Utilized During Treatment: Gait belt Activity Tolerance: Treatment limited secondary to medical complications (Comment) (+ orthostatics) Patient left: in bed;with call bell/phone within reach;with nursing/sitter in room Nurse Communication: Mobility status;Other (comment) (+ orthostatics) PT Visit Diagnosis: Unsteadiness on feet (R26.81);Muscle weakness (generalized) (M62.81);Dizziness and giddiness (R42)     Time: [id number] PT Time Calculation (min) (ACUTE ONLY): 10 min  Charges:  $Gait Training: 8-22 mins                     Lou Miner, DPT  Acute Rehabilitation Services  Pager: [phone] Office: [phone]    Rudean Hitt 04/27/2021, 4:18 PM

## 2021-04-27 NOTE — ED Notes (Signed)
Lunch tray delivered to pt at this time.

## 2021-04-27 NOTE — ED Notes (Signed)
Pt given snack by sitter.

## 2021-04-28 LAB — CBG MONITORING, ED
Glucose-Capillary: 56 mg/dL — ABNORMAL LOW (ref 70–99)
Glucose-Capillary: 70 mg/dL (ref 70–99)
Glucose-Capillary: 90 mg/dL (ref 70–99)
Glucose-Capillary: 132 mg/dL — ABNORMAL HIGH (ref 70–99)
Glucose-Capillary: 145 mg/dL — ABNORMAL HIGH (ref 70–99)
Glucose-Capillary: 127 mg/dL — ABNORMAL HIGH (ref 70–99)

## 2021-04-28 MED ORDER — INSULIN GLARGINE-YFGN 100 UNIT/ML ~~LOC~~ SOLN
33.0000 [IU] | Freq: Every day | SUBCUTANEOUS | Status: DC
  Administered 2021-04-29 – 2021-05-03 (×5): 33 [IU] via SUBCUTANEOUS
  Filled 2021-04-28 (×6): qty 0.33

## 2021-04-28 NOTE — ED Notes (Signed)
Pt up to shower with sitter.

## 2021-04-28 NOTE — ED Notes (Signed)
Pt given a cup of cranberry juice. Will recheck CBG in about 15-20 mins. This RN will continue to monitor.

## 2021-04-29 LAB — CBG MONITORING, ED
Glucose-Capillary: 121 mg/dL — ABNORMAL HIGH (ref 70–99)
Glucose-Capillary: 144 mg/dL — ABNORMAL HIGH (ref 70–99)
Glucose-Capillary: 201 mg/dL — ABNORMAL HIGH (ref 70–99)
Glucose-Capillary: 204 mg/dL — ABNORMAL HIGH (ref 70–99)

## 2021-04-29 NOTE — Progress Notes (Signed)
CSW received an Armed forces technical officer back from [place], Catering manager of the [facility]. CSW was told that she is still waiting to get a response back from social security if patient has to have a face to face appearance.

## 2021-04-29 NOTE — ED Notes (Signed)
Pt requesting a snack, pt provided with crackers and peanut butter

## 2021-04-29 NOTE — ED Notes (Addendum)
Minor abrasion noted from pt scratching face. RN cleaned and applied bandage

## 2021-04-29 NOTE — ED Notes (Signed)
Pt laying  in bed. Pt A&Ox4. Tolieting offered

## 2021-04-29 NOTE — ED Notes (Signed)
Pt ambulated to desk to ask for snack.  Snack provided.

## 2021-04-30 LAB — CBG MONITORING, ED
Glucose-Capillary: 160 mg/dL — ABNORMAL HIGH (ref 70–99)
Glucose-Capillary: 221 mg/dL — ABNORMAL HIGH (ref 70–99)
Glucose-Capillary: 260 mg/dL — ABNORMAL HIGH (ref 70–99)
Glucose-Capillary: 252 mg/dL — ABNORMAL HIGH (ref 70–99)

## 2021-04-30 NOTE — ED Notes (Signed)
Pt ambulated to bathroom and returned to his room

## 2021-04-30 NOTE — ED Provider Notes (Signed)
Emergency Medicine Observation Re-evaluation Note  Garrett [name] is a 36 y.o. male, seen on rounds today.  Pt initially presented to the ED for complaints of Weakness and Altered Mental Status Currently, the patient is resting in bed.  Physical Exam  BP 114/76 (BP Location: Right Arm)    Pulse 85    Temp 98.2 F (36.8 C) (Oral)    Resp 18    SpO2 100%  Physical Exam General: No acute distress Cardiac: Well perfused Lungs: Nonlabored  ED Course / MDM  EKG:EKG Interpretation  Date/Time:  Thursday April 14 2021 02:50:49 EST Ventricular Rate:  84 PR Interval:  148 QRS Duration: 80 QT Interval:  372 QTC Calculation: 439 R Axis:   99 Text Interpretation: Normal sinus rhythm Rightward axis Borderline ECG When compared with ECG of 12-Apr-2021 22:35, No significant change was found Confirmed by Dione Booze ([id number]) on 04/14/2021 2:55:48 AM  I have reviewed the labs performed to date as well as medications administered while in observation.  Recent changes in the last 24 hours include social work working on placement.  Plan  Current plan is for placement.  Garrett [name] is not under involuntary commitment.     Terrilee Files, MD 04/30/21 1323

## 2021-04-30 NOTE — ED Notes (Signed)
Sitter arrived for patient.

## 2021-05-01 LAB — CBG MONITORING, ED
Glucose-Capillary: 87 mg/dL (ref 70–99)
Glucose-Capillary: 188 mg/dL — ABNORMAL HIGH (ref 70–99)
Glucose-Capillary: 139 mg/dL — ABNORMAL HIGH (ref 70–99)
Glucose-Capillary: 66 mg/dL — ABNORMAL LOW (ref 70–99)

## 2021-05-01 NOTE — ED Notes (Signed)
PT A&Ox4 and resting in bed. Pt provided with a blanket

## 2021-05-01 NOTE — ED Notes (Signed)
Pt resting in bed. Pt glucose was checked and pt received AM meds, breakfast. Pt ambulatory in room, steady gait. Reported episode of diarrhea, was given PRN imodium. Pt denied further needs. Resting in bed with eyes closed, no distress noted.

## 2021-05-01 NOTE — ED Notes (Signed)
Pt complain of his stomach hurting with frequent trips to the bathroom, when asked if he had diarrhea pt stated he did.

## 2021-05-01 NOTE — ED Notes (Signed)
Patient has not eaten his dinner. NT warming it up. Patient given 8 oz of orange juice

## 2021-05-01 NOTE — ED Provider Notes (Signed)
Emergency Medicine Observation Re-evaluation Note  Garrett [name] is a 36 y.o. male, seen on rounds today.  Pt initially presented to the ED for complaints of Weakness and Altered Mental Status Currently, the patient is resting in bed.  Physical Exam  BP 136/85 (BP Location: Right Arm)    Pulse 89    Temp 98 F (36.7 C) (Oral)    Resp 16    SpO2 98%  Physical Exam General: No acute distress Cardiac: Well perfused Lungs: Nonlabored Psych: Redirectable  ED Course / MDM  EKG:EKG Interpretation  Date/Time:  Thursday April 14 2021 02:50:49 EST Ventricular Rate:  84 PR Interval:  148 QRS Duration: 80 QT Interval:  372 QTC Calculation: 439 R Axis:   99 Text Interpretation: Normal sinus rhythm Rightward axis Borderline ECG When compared with ECG of 12-Apr-2021 22:35, No significant change was found Confirmed by Dione Booze ([id number]) on 04/14/2021 2:55:48 AM  I have reviewed the labs performed to date as well as medications administered while in observation.  Recent changes in the last 24 hours include none.  Plan  Current plan is for placement.  Garrett [name] is not under involuntary commitment.     Terrilee Files, MD 05/01/21 [phone]

## 2021-05-02 LAB — CBG MONITORING, ED
Glucose-Capillary: 96 mg/dL (ref 70–99)
Glucose-Capillary: 165 mg/dL — ABNORMAL HIGH (ref 70–99)
Glucose-Capillary: 153 mg/dL — ABNORMAL HIGH (ref 70–99)
Glucose-Capillary: 119 mg/dL — ABNORMAL HIGH (ref 70–99)

## 2021-05-02 NOTE — ED Notes (Signed)
Pt's lunch has arrived. Pt sat up and eating his lunch, will continue to monitor pt

## 2021-05-02 NOTE — ED Notes (Signed)
Pt's dinner has arrived. Pt sitting up and eating his dinner, will continue to monitor pt

## 2021-05-02 NOTE — ED Notes (Signed)
Pt walked to the BR without any difficulty. Pt back on his bed, snack offered and pt agreed to a snack. This Clinical research associate checked pt's CBG first then gave pt 1 graham cracker, 1 peanut butter and a diet sprite. RN notified of CBG, will continue to monitor pt

## 2021-05-02 NOTE — ED Provider Notes (Signed)
Emergency Medicine Observation Re-evaluation Note  Garrett [name] is a 36 y.o. male, seen on rounds today.  Pt initially presented to the ED for complaints of Weakness and Altered Mental Status Currently, the patient is resting.  Physical Exam  BP 92/60 (BP Location: Right Arm)    Pulse 86    Temp 98 F (36.7 C) (Oral)    Resp 17    SpO2 100%  Physical Exam General: NAD Cardiac: HDS stable Lungs: no resp distress Psych: calm cooperative  ED Course / MDM  EKG:EKG Interpretation  Date/Time:  Thursday April 14 2021 02:50:49 EST Ventricular Rate:  84 PR Interval:  148 QRS Duration: 80 QT Interval:  372 QTC Calculation: 439 R Axis:   99 Text Interpretation: Normal sinus rhythm Rightward axis Borderline ECG When compared with ECG of 12-Apr-2021 22:35, No significant change was found Confirmed by Dione Booze ([id number]) on 04/14/2021 2:55:48 AM  I have reviewed the labs performed to date as well as medications administered while in observation.  Recent changes in the last 24 hours include none.  Plan  Current plan is for placement .  Garrett [name] is not under involuntary commitment.     Sloan Leiter, DO 05/02/21 [phone]

## 2021-05-02 NOTE — ED Notes (Signed)
Pt walked to the BR without any difficulty. Pt went back to his room and asked for a 'snack'. This Clinical research associate was advised by RN to provide pt with a diet sprite and non-sugary snacks. Writer brought pt 1 diet sprite and saltine crackers. Will continue to monitor pt

## 2021-05-02 NOTE — ED Notes (Signed)
Breakfast orders placed 

## 2021-05-02 NOTE — ED Notes (Addendum)
0750:Pt stood up to walk towards BR to wash face. Pt had a small episode in which he lost a bit of balance. Pt would swing from left to right and his knees locked. This Clinical research associate was by pt's side and helped him reassure that there was someone next to him. Pt did well and is sitting up while eating his breakfast tray. Will continue to monitor pt. RN notified  [phone]: Pt finished with all of breakfast and requested to go to the BR again. Pt did much better walking to the BR and back. Will continue to monitor pt

## 2021-05-02 NOTE — Progress Notes (Signed)
CSW received a call from Nelma Rothman the director with the [facility] who stated that she heard back from social security about the face to face appearance. CSW was told that the social security liaison stated that Ms. Price could come to the hospital and pick up patients documents so they can verify and make copies of the documents. Ms. Samuella Cota stated she can come next Wednesday February 8th and meet with CSW and have patient sign paperwork.

## 2021-05-03 LAB — CBC WITH DIFFERENTIAL/PLATELET
Abs Immature Granulocytes: 0.03 10*3/uL (ref 0.00–0.07)
Basophils Absolute: 0 10*3/uL (ref 0.0–0.1)
Basophils Relative: 0 %
Eosinophils Absolute: 0.3 10*3/uL (ref 0.0–0.5)
Eosinophils Relative: 3 %
HCT: 35.9 % — ABNORMAL LOW (ref 39.0–52.0)
Hemoglobin: 11.8 g/dL — ABNORMAL LOW (ref 13.0–17.0)
Immature Granulocytes: 0 %
Lymphocytes Relative: 34 %
Lymphs Abs: 2.6 10*3/uL (ref 0.7–4.0)
MCH: 31 pg (ref 26.0–34.0)
MCHC: 32.9 g/dL (ref 30.0–36.0)
MCV: 94.2 fL (ref 80.0–100.0)
Monocytes Absolute: 0.8 10*3/uL (ref 0.1–1.0)
Monocytes Relative: 10 %
Neutro Abs: 4 10*3/uL (ref 1.7–7.7)
Neutrophils Relative %: 53 %
Platelets: 171 10*3/uL (ref 150–400)
RBC: 3.81 MIL/uL — ABNORMAL LOW (ref 4.22–5.81)
RDW: 12.7 % (ref 11.5–15.5)
WBC: 7.8 10*3/uL (ref 4.0–10.5)
nRBC: 0 % (ref 0.0–0.2)

## 2021-05-03 LAB — CBG MONITORING, ED
Glucose-Capillary: 69 mg/dL — ABNORMAL LOW (ref 70–99)
Glucose-Capillary: 78 mg/dL (ref 70–99)
Glucose-Capillary: 190 mg/dL — ABNORMAL HIGH (ref 70–99)

## 2021-05-03 LAB — BASIC METABOLIC PANEL
Anion gap: 6 (ref 5–15)
BUN: 23 mg/dL — ABNORMAL HIGH (ref 6–20)
CO2: 30 mmol/L (ref 22–32)
Calcium: 9.1 mg/dL (ref 8.9–10.3)
Chloride: 102 mmol/L (ref 98–111)
Creatinine, Ser: 0.87 mg/dL (ref 0.61–1.24)
GFR, Estimated: >60 mL/min (ref >60)
Glucose, Bld: 113 mg/dL — ABNORMAL HIGH (ref 70–99)
Potassium: 4 mmol/L (ref 3.5–5.1)
Sodium: 138 mmol/L (ref 135–145)

## 2021-05-03 MED ORDER — LACTATED RINGERS IV BOLUS
1000.0000 mL | Freq: Once | INTRAVENOUS | Status: AC
  Administered 2021-05-03: 1000 mL via INTRAVENOUS

## 2021-05-03 NOTE — Progress Notes (Signed)
Inpatient Diabetes Program Recommendations  AACE/ADA: New Consensus Statement on Inpatient Glycemic Control (2015)  Target Ranges:  Prepandial:   less than 140 mg/dL      Peak postprandial:   less than 180 mg/dL (1-2 hours)      Critically ill patients:  140 - 180 mg/dL   Lab Results  Component Value Date   GLUCAP 69 (L) 05/03/2021   HGBA1C 5.9 (H) 04/24/2021    Review of Glycemic Control  Latest Reference Range & Units 05/02/21 07:17 05/02/21 11:10 05/02/21 16:57 05/02/21 21:35 05/03/21 07:41  Glucose-Capillary 70 - 99 mg/dL 96 165 (H) 153 (H) 119 (H) 69 (L)  (H): Data is abnormally high (L): Data is abnormally low  Inpatient Diabetes Program Recommendations:   -Decrease Semglee to 30 units qd  Thank you, Nani Gasser. Annabella Elford, RN, MSN, CDE  Diabetes Coordinator Inpatient Glycemic Control Team Team Pager [phone] (8am-5pm) 05/03/2021 10:31 AM

## 2021-05-03 NOTE — ED Notes (Signed)
Full shower by NT .

## 2021-05-03 NOTE — Progress Notes (Signed)
Physical Therapy Treatment Patient Details Name: Garrett [name] MRN: [id number] DOB: [birth year] Today's Date: 05/03/2021   History of Present Illness 36 yo male presenting to ED with  withdrawal from friends, lack of intake, not himself with possibly catatonic behavior; thought to be secondary to MDD.   Per nsg pt sustained a fall with unsteady knees early on in his admission. Current issue of hypotension, symptomatic.  PMHx: DM, medication non compliance, AMS, DKA, acute metabolic encephalopathy, MDD    PT Comments    Pt continues to be limited secondary to + orthostatics. BP initially in sitting at 75/55; 62/47 in standing, 72/51 upon return to sitting, and 63/48 after taking steps to/from EOB. Had pt return to sitting and do UE pumps and LAQ and BP up to 82/59. Had pt take steps at EOB again and BP at 70/46 following. Required min guard for mobility tasks and noted increased sway in standing. Current recommendations appropriate. Will continue to follow acutely.     Recommendations for follow up therapy are one component of a multi-disciplinary discharge planning process, led by the attending physician.  Recommendations may be updated based on patient status, additional functional criteria and insurance authorization.  Follow Up Recommendations  Skilled nursing-short term rehab (<3 hours/day)     Assistance Recommended at Discharge Frequent or constant Supervision/Assistance  Patient can return home with the following A little help with walking and/or transfers;A little help with bathing/dressing/bathroom;Assistance with cooking/housework;Help with stairs or ramp for entrance;Assist for transportation;Direct supervision/assist for financial management;Direct supervision/assist for medications management   Equipment Recommendations  None recommended by PT    Recommendations for Other Services       Precautions / Restrictions Precautions Precautions: Fall Precaution Comments: Watch  BP Restrictions Weight Bearing Restrictions: No     Mobility  Bed Mobility Overal bed mobility: Modified Independent                  Transfers Overall transfer level: Needs assistance Equipment used: None Transfers: Sit to/from Stand Sit to Stand: Min guard           General transfer comment: Min guard for safety. Increased sway following ambulation trials.    Ambulation/Gait Ambulation/Gait assistance: Min assist   Assistive device: None Gait Pattern/deviations: Step-through pattern Gait velocity: Decreased     General Gait Details: Only able to take steps forwards and backwards at EOB this session as pt's BP was very limiting.   Stairs             Wheelchair Mobility    Modified Rankin (Stroke Patients Only)       Balance Overall balance assessment: Needs assistance Sitting-balance support: Feet supported Sitting balance-Leahy Scale: Good     Standing balance support: No upper extremity supported Standing balance-Leahy Scale: Fair                              Cognition Arousal/Alertness: Awake/alert Behavior During Therapy: Flat affect Overall Cognitive Status: No family/caregiver present to determine baseline cognitive functioning                                 General Comments: Flat affect throughout        Exercises General Exercises - Lower Extremity Long Arc Quad: AROM, Both, 10 reps, Seated Other Exercises Other Exercises: UE shoulder elevation with fist squeezes X10.    General Comments General  comments (skin integrity, edema, etc.): BP initially in sitting at 75/55; 62/47 in standing, 72/51 upon return to sitting, and 63/48 after taking steps to/from EOB. Had pt return to sitting and do UE pumps and LAQ and BP up to 82/59. Had pt take steps at EOB again and BP at 70/46 following. RN notified.      Pertinent Vitals/Pain Pain Assessment Pain Assessment: No/denies pain    Home Living                           Prior Function            PT Goals (current goals can now be found in the care plan section) Acute Rehab PT Goals Patient Stated Goal: none reported PT Goal Formulation: With patient Time For Goal Achievement: 05/04/21 Potential to Achieve Goals: Fair Progress towards PT goals: Progressing toward goals    Frequency    Min 1X/week      PT Plan Current plan remains appropriate    Co-evaluation              AM-PAC PT "6 Clicks" Mobility   Outcome Measure  Help needed turning from your back to your side while in a flat bed without using bedrails?: None Help needed moving from lying on your back to sitting on the side of a flat bed without using bedrails?: None Help needed moving to and from a bed to a chair (including a wheelchair)?: A Little Help needed standing up from a chair using your arms (e.g., wheelchair or bedside chair)?: A Little Help needed to walk in hospital room?: A Little Help needed climbing 3-5 steps with a railing? : Total 6 Click Score: 18    End of Session Equipment Utilized During Treatment: Gait belt Activity Tolerance: Treatment limited secondary to medical complications (Comment) (+ orthostatics) Patient left: in bed;with call bell/phone within reach;with nursing/sitter in room Nurse Communication: Mobility status;Other (comment) (+ orthostatics) PT Visit Diagnosis: Unsteadiness on feet (R26.81);Muscle weakness (generalized) (M62.81);Dizziness and giddiness (R42)     Time: 1610-9604 PT Time Calculation (min) (ACUTE ONLY): 12 min  Charges:  $Therapeutic Activity: 8-22 mins                     Garrett [name], DPT  Acute Rehabilitation Services  Pager: [phone] Office: [phone]    Garrett [name] 05/03/2021, 12:49 PM

## 2021-05-03 NOTE — ED Notes (Signed)
Pt sitting up having breakfast.  °

## 2021-05-03 NOTE — ED Provider Notes (Signed)
Emergency Medicine Observation Re-evaluation Note  Garrett [name] is a 36 y.o. male, seen on rounds today.  Pt initially presented to the ED for complaints of Weakness and Altered Mental Status Currently, the patient is resting in bed.  Physical Exam  BP 96/66    Pulse 72    Temp 98 F (36.7 C) (Oral)    Resp 16    SpO2 99%  Physical Exam General: Awake  Lungs: Resp even and unlabored Psych: Calm and cooperative  ED Course / MDM  EKG:EKG Interpretation  Date/Time:  Thursday April 14 2021 02:50:49 EST Ventricular Rate:  84 PR Interval:  148 QRS Duration: 80 QT Interval:  372 QTC Calculation: 439 R Axis:   99 Text Interpretation: Normal sinus rhythm Rightward axis Borderline ECG When compared with ECG of 12-Apr-2021 22:35, No significant change was found Confirmed by Dione Booze ([id number]) on 04/14/2021 2:55:48 AM  I have reviewed the labs performed to date as well as medications administered while in observation.  Recent changes in the last 24 hours include continued orthostatic BPs. He is a thin person in general, so unclear what his baseline BP is, but he has had troubles with Physical Therapy at times due to being orthostatic. Per RN, he has been eating and drinking well. Not on meds which should affect BP.   Plan  Current plan is for recheck labs and give IVF for orthostatics today. No other apparent indication for medical admission. Still working towards SNF placement.   Josedaniel Dibiasio is not under involuntary commitment.     Pollyann Savoy, MD 05/03/21 1350

## 2021-05-03 NOTE — ED Notes (Signed)
Pt sitting up in recliner 

## 2021-05-03 NOTE — ED Notes (Signed)
Breakfast orders placed 

## 2021-05-04 ENCOUNTER — Emergency Department (HOSPITAL_COMMUNITY): Payer: Medicaid Other

## 2021-05-04 LAB — CBG MONITORING, ED
Glucose-Capillary: 97 mg/dL (ref 70–99)
Glucose-Capillary: 75 mg/dL (ref 70–99)
Glucose-Capillary: 185 mg/dL — ABNORMAL HIGH (ref 70–99)
Glucose-Capillary: 75 mg/dL (ref 70–99)
Glucose-Capillary: 154 mg/dL — ABNORMAL HIGH (ref 70–99)

## 2021-05-04 MED ORDER — INSULIN GLARGINE-YFGN 100 UNIT/ML ~~LOC~~ SOLN
30.0000 [IU] | Freq: Every day | SUBCUTANEOUS | Status: DC
  Administered 2021-05-05: 30 [IU] via SUBCUTANEOUS
  Filled 2021-05-04 (×2): qty 0.3

## 2021-05-04 NOTE — ED Notes (Addendum)
Pt up to restroom, followed by RN, walking without assistance. Pt pulled down pants and sat on toilet with RN standing beside patient. Pt leaned forward then suddenly fell forward off the toilet, striking face on ground. RN rolled patient onto back, held cspine, radioed for assistance. Patient responsive, reporting neck pain, laceration noted to right eye. [place] J placed. Horton MD assessed patient. Patient transferred to striker. Vital signs and CBG obtained and within normal limits. Pt had non-clinical sitter with him at time of fall.

## 2021-05-04 NOTE — ED Provider Notes (Signed)
Called by nursing to assess the patient after he fell off the toilet.  He has an abrasion/superficial laceration over the right eyebrow approximately 3 cm in length.  No active bleeding.  He is awake and alert.  No vomiting.  C-collar was placed.  CT head and neck ordered.   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 ECG/medicine tests: ordered and independent interpretation performed.  Risk OTC drugs. Prescription drug management.   CT head and C-spine reviewed.  No acute injury.  C-collar was removed.  Patient at baseline.       Shon Baton, MD 05/04/21 [phone]

## 2021-05-04 NOTE — Progress Notes (Signed)
Patients disability application is still pending. [facility] will be here on February 8th to have patient sign documents. The disability process can take up to 3-6 months for a decision.

## 2021-05-04 NOTE — ED Notes (Signed)
Not administering pt morning insulin pt CBG have now been in the 70's consistently

## 2021-05-04 NOTE — ED Notes (Signed)
Pt back in room from CT, Cspine precautions maintained.

## 2021-05-04 NOTE — ED Provider Notes (Signed)
Emergency Medicine Observation Re-evaluation Note  Garrett [name] is a 36 y.o. male, seen on rounds today.  Pt initially presented to the ED for complaints of Weakness and Altered Mental Status Currently, the patient is resting.  Physical Exam  BP 102/81 (BP Location: Right Arm)    Pulse 75    Temp 98.6 F (37 C) (Oral)    Resp 16    SpO2 97%  Physical Exam General: NAD Cardiac: HDS Lungs: no respiratory distress Psych: calm cooperative  ED Course / MDM  EKG:EKG Interpretation  Date/Time:  Thursday April 14 2021 02:50:49 EST Ventricular Rate:  84 PR Interval:  148 QRS Duration: 80 QT Interval:  372 QTC Calculation: 439 R Axis:   99 Text Interpretation: Normal sinus rhythm Rightward axis Borderline ECG When compared with ECG of 12-Apr-2021 22:35, No significant change was found Confirmed by Dione Booze ([id number]) on 04/14/2021 2:55:48 AM  I have reviewed the labs performed to date as well as medications administered while in observation.  Recent changes in the last 24 hours include fall early this am around 0200 while sitting on the toilet. CT completed and cleared by overnight MD. Ongoing issues with activity 2/2 being orthostatic. He is tolerating PO. Labs were re-checked yesterday and was given IVF. BP stable this morning.   Plan  Current plan is for placement.  Reyce Sisk is not under involuntary commitment.     Sloan Leiter, DO 05/04/21 [phone]

## 2021-05-04 NOTE — ED Notes (Signed)
Upon this nurse's arrival on shift, pt has been laying calmly in bed. No needs or concerns identified at this time.

## 2021-05-04 NOTE — ED Notes (Signed)
C-collar removed per MD Horton

## 2021-05-05 ENCOUNTER — Emergency Department (HOSPITAL_COMMUNITY): Payer: Medicaid Other

## 2021-05-05 ENCOUNTER — Inpatient Hospital Stay (HOSPITAL_COMMUNITY): Payer: Medicaid Other

## 2021-05-05 DIAGNOSIS — R7989 Other specified abnormal findings of blood chemistry: Secondary | ICD-10-CM

## 2021-05-05 DIAGNOSIS — I10 Essential (primary) hypertension: Secondary | ICD-10-CM | POA: Diagnosis present

## 2021-05-05 DIAGNOSIS — E11649 Type 2 diabetes mellitus with hypoglycemia without coma: Secondary | ICD-10-CM | POA: Diagnosis not present

## 2021-05-05 DIAGNOSIS — R531 Weakness: Secondary | ICD-10-CM

## 2021-05-05 DIAGNOSIS — R5381 Other malaise: Secondary | ICD-10-CM | POA: Diagnosis not present

## 2021-05-05 DIAGNOSIS — I700 Atherosclerosis of aorta: Secondary | ICD-10-CM | POA: Diagnosis present

## 2021-05-05 DIAGNOSIS — R338 Other retention of urine: Secondary | ICD-10-CM | POA: Diagnosis not present

## 2021-05-05 DIAGNOSIS — E860 Dehydration: Secondary | ICD-10-CM | POA: Diagnosis present

## 2021-05-05 DIAGNOSIS — K5901 Slow transit constipation: Secondary | ICD-10-CM | POA: Diagnosis not present

## 2021-05-05 DIAGNOSIS — G6289 Other specified polyneuropathies: Secondary | ICD-10-CM | POA: Diagnosis not present

## 2021-05-05 DIAGNOSIS — D638 Anemia in other chronic diseases classified elsewhere: Secondary | ICD-10-CM | POA: Diagnosis present

## 2021-05-05 DIAGNOSIS — N3289 Other specified disorders of bladder: Secondary | ICD-10-CM | POA: Diagnosis not present

## 2021-05-05 DIAGNOSIS — R5383 Other fatigue: Secondary | ICD-10-CM | POA: Diagnosis not present

## 2021-05-05 DIAGNOSIS — G629 Polyneuropathy, unspecified: Secondary | ICD-10-CM | POA: Diagnosis not present

## 2021-05-05 DIAGNOSIS — Z794 Long term (current) use of insulin: Secondary | ICD-10-CM | POA: Diagnosis not present

## 2021-05-05 DIAGNOSIS — B1910 Unspecified viral hepatitis B without hepatic coma: Secondary | ICD-10-CM | POA: Diagnosis not present

## 2021-05-05 DIAGNOSIS — Z681 Body mass index (BMI) 19 or less, adult: Secondary | ICD-10-CM | POA: Diagnosis not present

## 2021-05-05 DIAGNOSIS — E1142 Type 2 diabetes mellitus with diabetic polyneuropathy: Secondary | ICD-10-CM | POA: Diagnosis present

## 2021-05-05 DIAGNOSIS — E119 Type 2 diabetes mellitus without complications: Secondary | ICD-10-CM | POA: Diagnosis not present

## 2021-05-05 DIAGNOSIS — M545 Low back pain, unspecified: Secondary | ICD-10-CM | POA: Diagnosis not present

## 2021-05-05 DIAGNOSIS — D649 Anemia, unspecified: Secondary | ICD-10-CM

## 2021-05-05 DIAGNOSIS — E1143 Type 2 diabetes mellitus with diabetic autonomic (poly)neuropathy: Secondary | ICD-10-CM | POA: Diagnosis present

## 2021-05-05 DIAGNOSIS — F29 Unspecified psychosis not due to a substance or known physiological condition: Secondary | ICD-10-CM | POA: Diagnosis not present

## 2021-05-05 DIAGNOSIS — Z20822 Contact with and (suspected) exposure to covid-19: Secondary | ICD-10-CM | POA: Diagnosis present

## 2021-05-05 DIAGNOSIS — K719 Toxic liver disease, unspecified: Secondary | ICD-10-CM | POA: Diagnosis present

## 2021-05-05 DIAGNOSIS — D696 Thrombocytopenia, unspecified: Secondary | ICD-10-CM | POA: Diagnosis present

## 2021-05-05 DIAGNOSIS — W1830XA Fall on same level, unspecified, initial encounter: Secondary | ICD-10-CM | POA: Diagnosis present

## 2021-05-05 DIAGNOSIS — E1165 Type 2 diabetes mellitus with hyperglycemia: Secondary | ICD-10-CM | POA: Diagnosis present

## 2021-05-05 DIAGNOSIS — I951 Orthostatic hypotension: Secondary | ICD-10-CM | POA: Diagnosis present

## 2021-05-05 DIAGNOSIS — S0101XA Laceration without foreign body of scalp, initial encounter: Secondary | ICD-10-CM | POA: Diagnosis present

## 2021-05-05 DIAGNOSIS — F333 Major depressive disorder, recurrent, severe with psychotic symptoms: Secondary | ICD-10-CM | POA: Diagnosis present

## 2021-05-05 DIAGNOSIS — L210 Seborrhea capitis: Secondary | ICD-10-CM | POA: Diagnosis present

## 2021-05-05 DIAGNOSIS — B181 Chronic viral hepatitis B without delta-agent: Secondary | ICD-10-CM | POA: Diagnosis not present

## 2021-05-05 HISTORY — DX: Anemia, unspecified: D64.9

## 2021-05-05 LAB — COMPREHENSIVE METABOLIC PANEL
ALT: 789 U/L — ABNORMAL HIGH (ref 0–44)
AST: 590 U/L — ABNORMAL HIGH (ref 15–41)
Albumin: 3 g/dL — ABNORMAL LOW (ref 3.5–5.0)
Alkaline Phosphatase: 91 U/L (ref 38–126)
Anion gap: 5 (ref 5–15)
BUN: 13 mg/dL (ref 6–20)
CO2: 30 mmol/L (ref 22–32)
Calcium: 8.8 mg/dL — ABNORMAL LOW (ref 8.9–10.3)
Chloride: 101 mmol/L (ref 98–111)
Creatinine, Ser: 0.78 mg/dL (ref 0.61–1.24)
GFR, Estimated: >60 mL/min (ref >60)
Glucose, Bld: 187 mg/dL — ABNORMAL HIGH (ref 70–99)
Potassium: 4.4 mmol/L (ref 3.5–5.1)
Sodium: 136 mmol/L (ref 135–145)
Total Bilirubin: 1 mg/dL (ref 0.3–1.2)
Total Protein: 7.9 g/dL (ref 6.5–8.1)

## 2021-05-05 LAB — URINALYSIS, ROUTINE W REFLEX MICROSCOPIC
Bilirubin Urine: NEGATIVE
Glucose, UA: NEGATIVE mg/dL
Hgb urine dipstick: NEGATIVE
Ketones, ur: NEGATIVE mg/dL
Leukocytes,Ua: NEGATIVE
Nitrite: NEGATIVE
Protein, ur: NEGATIVE mg/dL
Specific Gravity, Urine: 1.01 (ref 1.005–1.030)
pH: 7 (ref 5.0–8.0)

## 2021-05-05 LAB — GLUCOSE, CAPILLARY
Glucose-Capillary: 145 mg/dL — ABNORMAL HIGH (ref 70–99)
Glucose-Capillary: 150 mg/dL — ABNORMAL HIGH (ref 70–99)

## 2021-05-05 LAB — FERRITIN: Ferritin: 696 ng/mL — ABNORMAL HIGH (ref 24–336)

## 2021-05-05 LAB — CBC
HCT: 35.6 % — ABNORMAL LOW (ref 39.0–52.0)
Hemoglobin: 11.6 g/dL — ABNORMAL LOW (ref 13.0–17.0)
MCH: 30.8 pg (ref 26.0–34.0)
MCHC: 32.6 g/dL (ref 30.0–36.0)
MCV: 94.4 fL (ref 80.0–100.0)
Platelets: 149 10*3/uL — ABNORMAL LOW (ref 150–400)
RBC: 3.77 MIL/uL — ABNORMAL LOW (ref 4.22–5.81)
RDW: 12.5 % (ref 11.5–15.5)
WBC: 5.9 10*3/uL (ref 4.0–10.5)
nRBC: 0 % (ref 0.0–0.2)

## 2021-05-05 LAB — IRON AND TIBC
Iron: 120 ug/dL (ref 45–182)
Saturation Ratios: 36 % (ref 17.9–39.5)
TIBC: 329 ug/dL (ref 250–450)
UIBC: 209 ug/dL

## 2021-05-05 LAB — LACTATE DEHYDROGENASE: LDH: 303 U/L — ABNORMAL HIGH (ref 98–192)

## 2021-05-05 LAB — PROTIME-INR
INR: 1 (ref 0.8–1.2)
Prothrombin Time: 13.1 seconds (ref 11.4–15.2)

## 2021-05-05 LAB — HIV ANTIBODY (ROUTINE TESTING W REFLEX): HIV Screen 4th Generation wRfx: NONREACTIVE

## 2021-05-05 LAB — CBG MONITORING, ED
Glucose-Capillary: 120 mg/dL — ABNORMAL HIGH (ref 70–99)
Glucose-Capillary: 133 mg/dL — ABNORMAL HIGH (ref 70–99)

## 2021-05-05 MED ORDER — SODIUM CHLORIDE 0.9% FLUSH
3.0000 mL | Freq: Two times a day (BID) | INTRAVENOUS | Status: DC
  Administered 2021-05-05 – 2021-07-08 (×107): 3 mL via INTRAVENOUS

## 2021-05-05 MED ORDER — INSULIN GLARGINE-YFGN 100 UNIT/ML ~~LOC~~ SOLN
20.0000 [IU] | Freq: Every day | SUBCUTANEOUS | Status: DC
  Filled 2021-05-05: qty 0.2

## 2021-05-05 MED ORDER — SENNOSIDES-DOCUSATE SODIUM 8.6-50 MG PO TABS
1.0000 | ORAL_TABLET | Freq: Every day | ORAL | Status: DC
  Administered 2021-05-05 – 2021-05-15 (×8): 1 via ORAL
  Filled 2021-05-05 (×9): qty 1

## 2021-05-05 MED ORDER — SODIUM CHLORIDE 0.9 % IV SOLN: 250.0000 mL | INTRAVENOUS | Status: DC | PRN

## 2021-05-05 MED ORDER — IOHEXOL 300 MG/ML  SOLN
100.0000 mL | Freq: Once | INTRAMUSCULAR | Status: AC | PRN
  Administered 2021-05-05: 80 mL via INTRAVENOUS

## 2021-05-05 MED ORDER — SODIUM CHLORIDE 0.9% FLUSH: 3.0000 mL | INTRAVENOUS | Status: DC | PRN

## 2021-05-05 MED ORDER — BENZTROPINE MESYLATE 0.5 MG PO TABS
0.5000 mg | ORAL_TABLET | Freq: Two times a day (BID) | ORAL | Status: DC
  Administered 2021-05-05 – 2021-07-12 (×135): 0.5 mg via ORAL
  Filled 2021-05-05 (×144): qty 1

## 2021-05-05 MED ORDER — SODIUM CHLORIDE 0.9 % IV SOLN: INTRAVENOUS | Status: AC

## 2021-05-05 NOTE — Consult Note (Signed)
Consultation  Referring Provider:   Dr. Rogers Blocker Primary Care Physician:  Charlott Rakes, MD Primary Gastroenterologist:  Althia Forts       Reason for Consultation:     Elevated LFTs Language Levester Fresh [id number] interpretor used   Impression    Elevated LFTs- has history of mild LFT elevation pattern compatible with hepatocellular injury , possible DILI, orthostatics/hypotension, hepatocellular disease.  Patient has been having systolic pressure in the [id number] with orthostasis- ? Component of hypoperfusion. Potentially offending medications: rispirodone can happen 8 weeks after starting, lexapro has been associated with some LFTs  Metformin started 04/27 rare LFT issues.  AST 590 ALT 789 Alkphos 91 TBili 1.0 [place] is unremarkable There is not evidence of biliary obstruction based on CT or labs.   Normocytic anemia Hemoglobin 11.6-stable Iron ferritin 2021 with elevated ferritin, elevated percent saturation, normal iron- ? sidroblastic  Thrombocytopenia Very mild has had previously no splenomegaly.  Depression/psychosis Recent addition of medications Awaiting bed placement no support  Diabetes On MF low dose Continue insulin   Plan   -Will send for work-up for other causes of LFTs including labs for autoimmune hepatitis, primary biliary cholangitis, iron panel and ferritin, alpha-1 antitrypsin, ceruloplasmin, viral hepatitis.  Pending ceruloplasmin, ANA Pending HIV, acute hepatits panel Repeat anemia panel Get AMA, IgG, ASMA,  1 antitrypsin -no evidence of HE -Will order PT/INR to ensure no coagulopathy. -Will continue to trend LFTs -If work-up is negative can consider holding risperidone  Thank you for your kind consultation, we will continue to follow.         HPI:   Garrett [name] is a 36 y.o. male with past medical history significant for severe depression with psychosis, T2DM history of DKA and orthostatic hypotension who has been holding in ED for about 2 months time  waiting on placement.   Was being cared for by members in the community but began not to have care. Has been boarding in the ED waiting for placement 2 months. Patient states used to drink alcohol but obviously no alcohol in the past 2 months in the ER, has history of smoking no recent smoking or drug use. Has had some new medications for depression with psychosis.  Due to orthostasis patient had some labs checked and had new elevated transaminases. Patient denies nausea and vomiting. This will have some abdominal pain when he eats a lot, cannot point to where the pain was. Patient states he does not have a bowel movement daily, but has had some loose bowel movements. Denies fever or chills. He can't tell me any family history.  Lived in [place] until he came here in his 49s.    Patient does states he has a rash on his upper back that itches, denies any other rashes. Has history of yellowing of skin or eyes. He denies any fever/chills, vision changes or headaches, no chest pain or palpitations, no shortness of breath or cough, no N/V, no dysuria or difficulty urinating.  Denies any change to his urine. No leg swelling.   ED course:  hgb: 11.6 (9.8-12.6), AST: 590 (105 three weeks ago) ALT: 789 (188 three weeks ago  Previous GI work up: RUQ AB [place] 05/05/21 Pending report CT AB and pelvis with contrast 05/05/21 1. Distended urinary bladder, bladder measuring at least 17 cm. Correlate for urinary retention. 2. Large burden of stool throughout the colon and rectum. 3. Extensive, predominately noncalcific aortic atherosclerosis, significantly advanced for patient age.  Past Medical History:  Diagnosis Date   Depression    Diabetes mellitus ([place])     Surgical History:  He  has no past surgical history on file. Family History:  His family history is not on file. Social History:   reports that he has never smoked. He has never used smokeless tobacco. No history on file for  alcohol use and drug use.  Prior to Admission medications   Medication Sig Start Date End Date Taking? Authorizing Provider  escitalopram (LEXAPRO) 10 MG tablet Take 1 tablet (10 mg total) by mouth daily. For mood control 07/11/20   Derrill Center, NP  feeding supplement (ENSURE ENLIVE / ENSURE PLUS) LIQD Take 237 mLs by mouth 2 (two) times daily between meals. Patient not taking: Reported on 07/01/2020 03/17/20   Rai, Vernelle Emerald, MD  glucose blood (CONTOUR NEXT TEST) test strip Use to check blood sugar up to TID. 07/28/20   Charlott Rakes, MD  ibuprofen (ADVIL) 400 MG tablet Take 1 tablet (400 mg total) by mouth every 8 (eight) hours as needed for mild pain or moderate pain. Patient not taking: Reported on 07/01/2020 03/17/20   Rai, Vernelle Emerald, MD  Insulin Glargine [facility] KWIKPEN) 100 UNIT/ML Inject 40 Units into the skin daily. 09/03/20   Charlott Rakes, MD  LORazepam (ATIVAN) 0.5 MG tablet Take 1 tablet (0.5 mg total) by mouth 2 (two) times daily. For anxiety Patient not taking: No sig reported 07/05/17   Money, Lowry Ram, FNP  metFORMIN (GLUCOPHAGE-XR) 500 MG 24 hr tablet Take 2 tablets (1,000 mg total) by mouth daily with breakfast. After 1 week, increase to 4 tablets by mouth daily if tolerable. 08/16/20   Charlott Rakes, MD  risperiDONE (RISPERDAL) 2 MG tablet Take 1 tablet (2 mg total) by mouth 2 (two) times daily. For mood control 07/11/20   Derrill Center, NP  tamsulosin (FLOMAX) 0.4 MG CAPS capsule Take 1 capsule (0.4 mg total) by mouth daily. 03/18/20   Rai, Vernelle Emerald, MD  thiamine 100 MG tablet Take 1 tablet (100 mg total) by mouth daily. 03/17/20   Mendel Corning, MD    Current Facility-Administered Medications  Medication Dose Route Frequency Provider Last Rate Last Admin   0.9 %  sodium chloride infusion  250 mL Intravenous PRN Orma Flaming, MD       benztropine (COGENTIN) tablet 0.5 mg  0.5 mg Oral BID Pattricia Boss, MD       escitalopram (LEXAPRO) tablet 10 mg  10 mg Oral  Daily Mesner, Jason, MD   10 mg at 05/05/21 1034   ibuprofen (ADVIL) tablet 400 mg  400 mg Oral Q8H PRN Mesner, Corene Cornea, MD   400 mg at 04/28/21 0930   insulin aspart (novoLOG) injection 0-9 Units  0-9 Units Subcutaneous TID WC Deno Etienne, DO   1 Units at 05/05/21 1240   insulin glargine-yfgn (SEMGLEE) injection 30 Units  30 Units Subcutaneous Daily Wynona Dove A, DO   30 Units at 05/05/21 1035   loperamide (IMODIUM) capsule 4 mg  4 mg Oral PRN Tegeler, Gwenyth Allegra, MD   4 mg at 05/01/21 1713   LORazepam (ATIVAN) tablet 0.5 mg  0.5 mg Oral QHS Rankin, Shuvon B, NP   0.5 mg at 05/04/21 2158   metFORMIN (GLUCOPHAGE-XR) 24 hr tablet 500 mg  500 mg Oral Q breakfast Pattricia Boss, MD   500 mg at 05/05/21 1033   risperiDONE (RISPERDAL) tablet 2 mg  2 mg Oral BID Mesner, Corene Cornea, MD   2 mg  at 05/05/21 1034   senna-docusate (Senokot-S) tablet 1 tablet  1 tablet Oral QHS Orma Flaming, MD       sodium chloride flush (NS) 0.9 % injection 3 mL  3 mL Intravenous Q12H Orma Flaming, MD       sodium chloride flush (NS) 0.9 % injection 3 mL  3 mL Intravenous PRN Orma Flaming, MD       thiamine tablet 100 mg  100 mg Oral Daily Mesner, Jason, MD   100 mg at 05/05/21 1034   Current Outpatient Medications  Medication Sig Dispense Refill   escitalopram (LEXAPRO) 10 MG tablet Take 1 tablet (10 mg total) by mouth daily. For mood control 30 tablet 0   feeding supplement (ENSURE ENLIVE / ENSURE PLUS) LIQD Take 237 mLs by mouth 2 (two) times daily between meals. (Patient not taking: Reported on 07/01/2020) 237 mL 12   glucose blood (CONTOUR NEXT TEST) test strip Use to check blood sugar up to TID. 100 each 2   ibuprofen (ADVIL) 400 MG tablet Take 1 tablet (400 mg total) by mouth every 8 (eight) hours as needed for mild pain or moderate pain. (Patient not taking: Reported on 07/01/2020) 30 tablet 0   Insulin Glargine (BASAGLAR KWIKPEN) 100 UNIT/ML Inject 40 Units into the skin daily. 15 mL 2   LORazepam  (ATIVAN) 0.5 MG tablet Take 1 tablet (0.5 mg total) by mouth 2 (two) times daily. For anxiety (Patient not taking: No sig reported) 14 tablet 0   metFORMIN (GLUCOPHAGE-XR) 500 MG 24 hr tablet Take 2 tablets (1,000 mg total) by mouth daily with breakfast. After 1 week, increase to 4 tablets by mouth daily if tolerable. 120 tablet 2   risperiDONE (RISPERDAL) 2 MG tablet Take 1 tablet (2 mg total) by mouth 2 (two) times daily. For mood control 60 tablet 0   tamsulosin (FLOMAX) 0.4 MG CAPS capsule Take 1 capsule (0.4 mg total) by mouth daily. 30 capsule 3   thiamine 100 MG tablet Take 1 tablet (100 mg total) by mouth daily.      Allergies as of 03/04/2021   (No Known Allergies)    Review of Systems:    Constitutional: No weight loss, fever, chills, weakness or fatigue HEENT: Eyes: No change in vision               Ears, Nose, Throat:  No change in hearing or congestion Skin: No rash or itching Cardiovascular: No chest pain, chest pressure or palpitations   Respiratory: No SOB or cough Gastrointestinal: See HPI and otherwise negative Genitourinary: No dysuria or change in urinary frequency Neurological: No headache, dizziness or syncope Musculoskeletal: No new muscle or joint pain Hematologic: No bleeding or bruising Psychiatric: No history of depression or anxiety     Physical Exam:  Vital signs in last 24 hours: Temp:  [97.6 F (36.4 C)-98.1 F (36.7 C)] 97.6 F (36.4 C) (02/02 1437) Pulse Rate:  [73-84] 80 (02/02 1437) Resp:  [16-19] 19 (02/02 1437) BP: (94-138)/(73-96) 109/85 (02/02 1437) SpO2:  [99 %-100 %] 100 % (02/02 1437)    General:   Pleasant, well developed male in no acute distress Head:  Normocephalic and atraumatic. Eyes: sclerae anicteric,conjunctive pink  Heart:  regular rate and rhythm Pulm: Clear anteriorly; no wheezing Abdomen:  Soft, non distended,Normal bowel sounds.  no  tenderness Without guarding and Without rebound, without  hepatomegaly. Extremities:  Without edema. Msk:  Symmetrical without gross deformities. Peripheral pulses intact.  Neurologic:  Alert and  oriented x4;  grossly normal neurologically. Skin: Upper back with scaly hyperpigmented flat patches.  Psychiatric: Limited responses  LAB RESULTS: Recent Labs    05/03/21 1500 05/05/21 0850  WBC 7.8 5.9  HGB 11.8* 11.6*  HCT 35.9* 35.6*  PLT 171 149*   BMET Recent Labs    05/03/21 1500 05/05/21 0850  NA 138 136  K 4.0 4.4  CL 102 101  CO2 30 30  GLUCOSE 113* 187*  BUN 23* 13  CREATININE 0.87 0.78  CALCIUM 9.1 8.8*   LFT Recent Labs    05/05/21 0850  PROT 7.9  ALBUMIN 3.0*  AST 590*  ALT 789*  ALKPHOS 91  BILITOT 1.0   PT/INR No results for input(s): LABPROT, INR in the last 72 hours.  STUDIES: CT Head Wo Contrast  Result Date: 05/04/2021 CLINICAL DATA:  Recent fall EXAM: CT HEAD WITHOUT CONTRAST CT CERVICAL SPINE WITHOUT CONTRAST TECHNIQUE: Multidetector CT imaging of the head and cervical spine was performed following the standard protocol without intravenous contrast. Multiplanar CT image reconstructions of the cervical spine were also generated. RADIATION DOSE REDUCTION: This exam was performed according to the departmental dose-optimization program which includes automated exposure control, adjustment of the mA and/or kV according to patient size and/or use of iterative reconstruction technique. COMPARISON:  03/28/2021 FINDINGS: CT HEAD FINDINGS Brain: No evidence of acute infarction, hemorrhage, hydrocephalus, extra-axial collection or mass lesion/mass effect. Vascular: No hyperdense vessel or unexpected calcification. Skull: Normal. Negative for fracture or focal lesion. Sinuses/Orbits: Mucosal thickening is noted within the ethmoid and maxillary sinuses increased from the prior exam. Other: None CT CERVICAL SPINE FINDINGS Alignment: Within normal limits. Skull base and vertebrae: 7 cervical segments are well visualized.  Vertebral body height is well maintained. No acute fracture or acute facet abnormality is noted. The odontoid is within normal limits. Soft tissues and spinal canal: Surrounding soft tissue structures are within normal limits. Upper chest: Visualized lung apices are within normal limits. Other: None IMPRESSION: CT of the head: No acute intracranial abnormality is noted. Persistent mucosal thickening in the paranasal sinuses. CT of cervical spine: No acute abnormality noted. Electronically Signed   By: Inez Catalina M.D.   On: 05/04/2021 02:46   CT Cervical Spine Wo Contrast  Result Date: 05/04/2021 CLINICAL DATA:  Recent fall EXAM: CT HEAD WITHOUT CONTRAST CT CERVICAL SPINE WITHOUT CONTRAST TECHNIQUE: Multidetector CT imaging of the head and cervical spine was performed following the standard protocol without intravenous contrast. Multiplanar CT image reconstructions of the cervical spine were also generated. RADIATION DOSE REDUCTION: This exam was performed according to the departmental dose-optimization program which includes automated exposure control, adjustment of the mA and/or kV according to patient size and/or use of iterative reconstruction technique. COMPARISON:  03/28/2021 FINDINGS: CT HEAD FINDINGS Brain: No evidence of acute infarction, hemorrhage, hydrocephalus, extra-axial collection or mass lesion/mass effect. Vascular: No hyperdense vessel or unexpected calcification. Skull: Normal. Negative for fracture or focal lesion. Sinuses/Orbits: Mucosal thickening is noted within the ethmoid and maxillary sinuses increased from the prior exam. Other: None CT CERVICAL SPINE FINDINGS Alignment: Within normal limits. Skull base and vertebrae: 7 cervical segments are well visualized. Vertebral body height is well maintained. No acute fracture or acute facet abnormality is noted. The odontoid is within normal limits. Soft tissues and spinal canal: Surrounding soft tissue structures are within normal limits.  Upper chest: Visualized lung apices are within normal limits. Other: None IMPRESSION: CT of the head: No acute intracranial abnormality is noted. Persistent mucosal  thickening in the paranasal sinuses. CT of cervical spine: No acute abnormality noted. Electronically Signed   By: Inez Catalina M.D.   On: 05/04/2021 02:46   CT ABDOMEN PELVIS W CONTRAST  Result Date: 05/05/2021 CLINICAL DATA:  Abdominal pain, elevated transaminases, trauma, cholecystitis, other EXAM: CT ABDOMEN AND PELVIS WITH CONTRAST TECHNIQUE: Multidetector CT imaging of the abdomen and pelvis was performed using the standard protocol following bolus administration of intravenous contrast. RADIATION DOSE REDUCTION: This exam was performed according to the departmental dose-optimization program which includes automated exposure control, adjustment of the mA and/or kV according to patient size and/or use of iterative reconstruction technique. CONTRAST:  15mL OMNIPAQUE IOHEXOL 300 MG/ML  SOLN COMPARISON:  03/09/2020 FINDINGS: Lower chest: No acute abnormality. Hepatobiliary: No solid liver abnormality is seen. Contracted gallbladder. No gallstones, gallbladder wall thickening, or biliary dilatation. Pancreas: Unremarkable. No pancreatic ductal dilatation or surrounding inflammatory changes. Spleen: Normal in size without significant abnormality. Adrenals/Urinary Tract: Adrenal glands are unremarkable. Kidneys are normal, without renal calculi, solid lesion, or hydronephrosis. Distended urinary bladder, bladder measuring at least 17 cm. Stomach/Bowel: Stomach is within normal limits. Appendix appears normal. No evidence of bowel wall thickening, distention, or inflammatory changes. Large burden of stool throughout the colon and rectum. Vascular/Lymphatic: Extensive, predominately noncalcific aortic atherosclerosis. No enlarged abdominal or pelvic lymph nodes. Reproductive: No mass or other significant abnormality. Other: No abdominal wall hernia or  abnormality. No ascites. Musculoskeletal: No acute or significant osseous findings. IMPRESSION: 1. Distended urinary bladder, bladder measuring at least 17 cm. Correlate for urinary retention. 2. Large burden of stool throughout the colon and rectum. 3. Extensive, predominately noncalcific aortic atherosclerosis, significantly advanced for patient age. Aortic Atherosclerosis (ICD10-I70.0). Electronically Signed   By: Delanna Ahmadi M.D.   On: 05/05/2021 13:16     Vladimir Crofts  05/05/2021, 3:30 PM

## 2021-05-05 NOTE — Progress Notes (Signed)
Pt arrived to unit from ED   CCMD called ,CHG given.pt had fall this admission in ED Everlean Cherry, RN    05/05/21 1657  Vitals  Temp 97.6 F (36.4 C)  Temp Source Oral  BP (!) 148/107  MAP (mmHg) 120  BP Location Left Arm  BP Method Automatic  Patient Position (if appropriate) Lying  Pulse Rate 75  Pulse Rate Source Monitor  ECG Heart Rate 75  Resp 17  Level of Consciousness  Level of Consciousness Alert  Oxygen Therapy  SpO2 100 %  O2 Device Room Air  O2 Flow Rate (L/min) 0 L/min  Pain Assessment  Pain Scale 0-10  Pain Score 0  MEWS Score  MEWS Temp 0  MEWS Systolic 0  MEWS Pulse 0  MEWS RR 0  MEWS LOC 0  MEWS Score 0  MEWS Score Color [place] Si

## 2021-05-05 NOTE — Assessment & Plan Note (Addendum)
Appreciate psychiatry assistance Stable on lexapro and cogentin Risperidone discontinued 2/2 elevated LFTs and Invega initiated by the psychiatric team Social isolation due to language barrier remains a significant problem and is likely influencing his depression

## 2021-05-05 NOTE — ED Notes (Signed)
Pt transported to CT ?

## 2021-05-05 NOTE — Progress Notes (Signed)
MEDICATION RELATED CONSULT NOTE - INITIAL   Pharmacy Consult for medication review for side effects of orthostatic hypotension  No Known Allergies  Vital Signs: Temp: 98.1 F (36.7 C) (02/02 0636) Temp Source: Oral (02/02 0636) BP: 94/73 (02/02 0636) Pulse Rate: 84 (02/02 0636) Intake/Output from previous day: No intake/output data recorded. Intake/Output from this shift: No intake/output data recorded.  Labs: Recent Labs    05/03/21 1500  WBC 7.8  HGB 11.8*  HCT 35.9*  PLT 171  CREATININE 0.87   CrCl cannot be calculated (Unknown ideal weight.).   Microbiology: Recent Results (from the past 720 hour(s))  Resp Panel by RT-PCR (Flu A&B, Covid) Nasopharyngeal Swab     Status: None   Collection Time: 04/11/21  3:03 PM   Specimen: Nasopharyngeal Swab; Nasopharyngeal(NP) swabs in vial transport medium  Result Value Ref Range Status   SARS Coronavirus 2 by RT PCR NEGATIVE NEGATIVE Final    Comment: (NOTE) SARS-CoV-2 target nucleic acids are NOT DETECTED.  The SARS-CoV-2 RNA is generally detectable in upper respiratory specimens during the acute phase of infection. The lowest concentration of SARS-CoV-2 viral copies this assay can detect is 138 copies/mL. A negative result does not preclude SARS-Cov-2 infection and should not be used as the sole basis for treatment or other patient management decisions. A negative result may occur with  improper specimen collection/handling, submission of specimen other than nasopharyngeal swab, presence of viral mutation(s) within the areas targeted by this assay, and inadequate number of viral copies(<138 copies/mL). A negative result must be combined with clinical observations, patient history, and epidemiological information. The expected result is Negative.  Fact Sheet for Patients:  BloggerCourse.com  Fact Sheet for Healthcare Providers:  SeriousBroker.it  This test is no t yet  approved or cleared by the [place] FDA and  has been authorized for detection and/or diagnosis of SARS-CoV-2 by FDA under an Emergency Use Authorization (EUA). This EUA will remain  in effect (meaning this test can be used) for the duration of the COVID-19 declaration under Section 564(b)(1) of the Act, 21 U.S.C.section 360bbb-3(b)(1), unless the authorization is terminated  or revoked sooner.       Influenza A by PCR NEGATIVE NEGATIVE Final   Influenza B by PCR NEGATIVE NEGATIVE Final    Comment: (NOTE) The Xpert Xpress SARS-CoV-2/FLU/RSV plus assay is intended as an aid in the diagnosis of influenza from Nasopharyngeal swab specimens and should not be used as a sole basis for treatment. Nasal washings and aspirates are unacceptable for Xpert Xpress SARS-CoV-2/FLU/RSV testing.  Fact Sheet for Patients: BloggerCourse.com  Fact Sheet for Healthcare Providers: SeriousBroker.it  This test is not yet approved or cleared by the [place] FDA and has been authorized for detection and/or diagnosis of SARS-CoV-2 by FDA under an Emergency Use Authorization (EUA). This EUA will remain in effect (meaning this test can be used) for the duration of the COVID-19 declaration under Section 564(b)(1) of the Act, 21 U.S.C. section 360bbb-3(b)(1), unless the authorization is terminated or revoked.  Performed at [facility], [address], [place], [place] [address]   C Difficile Quick Screen w PCR reflex     Status: None   Collection Time: 04/14/21  2:51 AM   Specimen: STOOL  Result Value Ref Range Status   C Diff antigen NEGATIVE NEGATIVE Final   C Diff toxin NEGATIVE NEGATIVE Final   C Diff interpretation No C. difficile detected.  Final    Comment: Performed at [facility]  Lab, [address] [place], [place] [address]    Medical History: Past Medical History:  Diagnosis Date   Depression    Diabetes mellitus  (HCC)     Assessment: Patient has been receiving two anticholinergic drugs (benztropine and risperidone) at ~2200 every night. There could possibly be additive anticholinergic side effects when the medications are administered together. Patient was also experiencing some hypoglycemic events which have since resolved.  Plan:  Consider spacing the evening doses of risperidone and benztropine to prevent additive anticholinergic effects.   Valeda Malm, Pharm.D. PGY-1 Pharmacy Resident [phone] 05/05/2021 8:46 AM

## 2021-05-05 NOTE — Assessment & Plan Note (Addendum)
See Hepatitis B notation

## 2021-05-05 NOTE — Assessment & Plan Note (Addendum)
History difficult from patient, but has stated that he has no issues urinating.  , -bladder scans are showing approx 400cc urine, but he is able to pass urine.  Unclear when this occurred. -if he continues to have significant post void residual, will need to consider I and O and possibly foley catheter if persists

## 2021-05-05 NOTE — Assessment & Plan Note (Addendum)
Has had episodic orthostasis while in ED during the past 2 months and received intermittent IV fluids TSH normal, cortisol checked on 04/14/2021 low at 3.1 but cosyntropin stim test on 2/6 baseline cortisol 11 which ruled out adrenal insufficiency -continue TED hose -Cont Midodrine

## 2021-05-05 NOTE — ED Notes (Signed)
IV flushed

## 2021-05-05 NOTE — Assessment & Plan Note (Addendum)
Iron panel: Ferritin 696.  B12 in December 2021: 750 and folate 16.5. Hemoglobin stable in the 12 g range (3/11).  Outpatient follow-up

## 2021-05-05 NOTE — ED Provider Notes (Addendum)
Patient is difficult to evaluate due to language barrier. However he does appear to follow my instructions and seems to understand what I am asking him to do 36 year old male history of schizophrenia and catatonia has been boarding in the ED for approximately 2 months and has had recurrent falls.  During the first month, he had ongoing diarrhea.  His metformin was adjusted from 1 g down to 500 and antihypertensives were stopped.  The diarrhea improved and he is not reporting any diarrhea.  He has continued to have intermittent orthostatic hypotension with resultant falls.  He is a diabetic and had had hypoglycemic events.  With the decrease in his metformin, decrease in long-acting insulin, his hypoglycemic events have decreased. He again fell last night and appeared to be orthostatic.  He had an abrasion of the right eyelid that was 3 cm in length CT of the head and neck were ordered and no acute injury was noted. However, patient has continued to be orthostatic and blood pressures have been somewhat low this morning. In consult with pharmacy, his medications risperidone and Cogentin from being dosed at the same times at 10 and 10 to spared and staying at 10 AM 10 P and Cogentin being changed to [id number] to minimize the effects of orthostatic hypotension.  Physical Exam  BP 94/73    Pulse 84    Temp 98.1 F (36.7 C) (Oral)    Resp 16    SpO2 100%   Physical Exam Vitals reviewed.  HENT:     Head:     Comments: Healing wound right face    Mouth/Throat:     Mouth: Mucous membranes are moist.  Cardiovascular:     Rate and Rhythm: Normal rate and regular rhythm.  Pulmonary:     Effort: Pulmonary effort is normal.  Abdominal:     General: Abdomen is flat.     Tenderness: There is abdominal tenderness.     Comments: Patient with tenderness palpation right side of abdomen  Neurological:     Mental Status: He is alert.    Procedures  Procedures  ED Course / MDM   Clinical Course as of  05/05/21 1205  Sat Mar 05, 2021  0705 Schizophrenia, negative symptoms, +/- catatonia  [MK]    Clinical Course User Index [MK] Kommor, Madison, MD   Medical Decision Making Amount and/or Complexity of Data Reviewed Labs: ordered. Radiology: ordered. ECG/medicine tests: ordered and independent interpretation performed.  Risk OTC drugs. Prescription drug management. Decision regarding hospitalization.   Current plan- Continue iv fluids CT- patient with elevated transaminases-?medication side fx, vs trauma, vs cholecystitis, vs other Orthostatic hypotension with falls secondary to same- patient has had multiple iv fluids and medication changes, unclear etiology Plan consult to medicine for assistance with management  CT reviewed with ?urinary retention and large stool burden, ow no acute changes. Discussed care with Dr. Artis Flock who will see for admission      Margarita Grizzle, MD 05/05/21 1221    Margarita Grizzle, MD 05/05/21 [phone]

## 2021-05-05 NOTE — Assessment & Plan Note (Addendum)
Hga1c of 5.9 04/2021 HOME MEDS: Lantus 40 units daily and Metformin XR 500 mg daily Tightly controlled in setting of hypoglycemic events  Stopped metformin 2/2 diarrhea.   Continue Tradjenta and Glucotrol and Semglee CBGs remain well controlled  Patient requesting a snack before I left the room

## 2021-05-05 NOTE — ED Notes (Signed)
Blood work drawn and sent to lab.

## 2021-05-05 NOTE — H&P (Signed)
History and Physical    PatientElver [name] [id number] DOB: 11-07-1985 DOA: 03/04/2021 DOS: the patient was seen and examined on 05/05/2021 PCP: Charlott Rakes, MD  Patient coming from:  ER     Chief Complaint: elevated transaminases/orthostatic hypotension   HPI: Garrett [name] is a 36 y.o. male with medical history significant of severe depression with psychosis, T2DM and orthostatic hypotension who has been holding in ED for about 2 months time waiting on placement. He was being cared for by members in the community, but they have returned home and he no longer has care. He has been boarding in ED with some hypoglycemic episodes, diarrhea (resolved after metformin adjusted) and some orthostatic hypotension taht continues to persist despite stopping his antihypertensive medication as well as addition of SSRI/mood stabilizing drugs. Today Ed was rounding on him and due to orthostasis, checked labs and he now has a new transaminitis and right sided abdominal pain. [facility] was called to admit.   He is sitting in the bed comfortably. No complaints. Language translation service used for entire exam/history. He tells me he has had diarrhea a few times a day for 2-3 days with RUQ pain. He can't rate the pain but says it is sharp. No radiation. Not sure if worse with food. Has diarrhea after he eats. He denies any other symptoms. He can't tell me any family history. Lived in [place] until he came here in his 60s. Does have history of smoking and drinking alcohol. Can not tell me how much he drank. Denies drug use.   He denies any fever/chills, vision changes or headaches, no chest pain or palpitations, no shortness of breath or cough, no N/V, no dysuria or difficulty urinating. Denies any change to his urine. No leg swelling.    Vitals today:  Afebrile, bp: 94/73, HR: 84, RR: 16, oxygen: 100% RA Pertinent labs: hgb: 11.6 (9.8-12.6), AST: 590 (105 three weeks ago) ALT: 789 (188 three weeks ago) CT abdomen:     Review of Systems: As mentioned in the history of present illness. All other systems reviewed and are negative. Past Medical History:  Diagnosis Date   Depression    Diabetes mellitus ([place])    History reviewed. No pertinent surgical history. Social History:  reports that he has never smoked. He has never used smokeless tobacco. No history on file for alcohol use and drug use.  No Known Allergies  History reviewed. No pertinent family history.  Prior to Admission medications   Medication Sig Start Date End Date Taking? Authorizing Provider  escitalopram (LEXAPRO) 10 MG tablet Take 1 tablet (10 mg total) by mouth daily. For mood control 07/11/20   Derrill Center, NP  feeding supplement (ENSURE ENLIVE / ENSURE PLUS) LIQD Take 237 mLs by mouth 2 (two) times daily between meals. Patient not taking: Reported on 07/01/2020 03/17/20   Rai, Vernelle Emerald, MD  glucose blood (CONTOUR NEXT TEST) test strip Use to check blood sugar up to TID. 07/28/20   Charlott Rakes, MD  ibuprofen (ADVIL) 400 MG tablet Take 1 tablet (400 mg total) by mouth every 8 (eight) hours as needed for mild pain or moderate pain. Patient not taking: Reported on 07/01/2020 03/17/20   Rai, Vernelle Emerald, MD  Insulin Glargine [facility] KWIKPEN) 100 UNIT/ML Inject 40 Units into the skin daily. 09/03/20   Charlott Rakes, MD  LORazepam (ATIVAN) 0.5 MG tablet Take 1 tablet (0.5 mg total) by mouth 2 (two) times daily. For anxiety Patient not taking: No sig reported  07/05/17   Money, Lowry Ram, FNP  metFORMIN (GLUCOPHAGE-XR) 500 MG 24 hr tablet Take 2 tablets (1,000 mg total) by mouth daily with breakfast. After 1 week, increase to 4 tablets by mouth daily if tolerable. 08/16/20   Charlott Rakes, MD  risperiDONE (RISPERDAL) 2 MG tablet Take 1 tablet (2 mg total) by mouth 2 (two) times daily. For mood control 07/11/20   Derrill Center, NP  tamsulosin (FLOMAX) 0.4 MG CAPS capsule Take 1 capsule (0.4 mg total) by mouth daily. 03/18/20   Rai,  Vernelle Emerald, MD  thiamine 100 MG tablet Take 1 tablet (100 mg total) by mouth daily. 03/17/20   Mendel Corning, MD    Physical Exam: Vitals:   05/04/21 2013 05/05/21 0636 05/05/21 1437 05/05/21 1657  BP: 107/73 94/73 109/85 (!) 148/107  Pulse: 81 84 80 75  Resp: 16 16 19 17   Temp: 98 F (36.7 C) 98.1 F (36.7 C) 97.6 F (36.4 C) 97.6 F (36.4 C)  TempSrc: Oral Oral Oral Oral  SpO2: 100% 100% 100% 100%   General:  Appears calm and comfortable and is in NAD Eyes:  PERRL, EOMI, normal lids, iris.  ENT:  grossly normal hearing, lips & tongue, mmm; poor dentition, multiple missing teeth  Neck:  no LAD, masses or thyromegaly; no carotid bruits Cardiovascular:  RRR, no m/r/g. No LE edema.  Respiratory:   CTA bilaterally with no wheezes/rales/rhonchi.  Normal respiratory effort. Abdomen:  soft, NT, ND, NABS. Negative murphy sign. No hepatomegaly  Back:   normal alignment, no CVAT Skin:  no rash or induration seen on limited exam Musculoskeletal:  grossly normal tone BUE/BLE, good ROM, no bony abnormality Lower extremity:  No LE edema.  Limited foot exam with no ulcerations.  2+ distal pulses. Psychiatric:  grossly normal mood and affect, speech fluent and appropriate, AOx3 Neurologic:  CN 2-12 grossly intact, moves all extremities in coordinated fashion, sensation intact   Radiological Exams on Admission: Independently reviewed - see discussion in A/P where applicable  CT Head Wo Contrast  Result Date: 05/04/2021 CLINICAL DATA:  Recent fall EXAM: CT HEAD WITHOUT CONTRAST CT CERVICAL SPINE WITHOUT CONTRAST TECHNIQUE: Multidetector CT imaging of the head and cervical spine was performed following the standard protocol without intravenous contrast. Multiplanar CT image reconstructions of the cervical spine were also generated. RADIATION DOSE REDUCTION: This exam was performed according to the departmental dose-optimization program which includes automated exposure control, adjustment of the  mA and/or kV according to patient size and/or use of iterative reconstruction technique. COMPARISON:  03/28/2021 FINDINGS: CT HEAD FINDINGS Brain: No evidence of acute infarction, hemorrhage, hydrocephalus, extra-axial collection or mass lesion/mass effect. Vascular: No hyperdense vessel or unexpected calcification. Skull: Normal. Negative for fracture or focal lesion. Sinuses/Orbits: Mucosal thickening is noted within the ethmoid and maxillary sinuses increased from the prior exam. Other: None CT CERVICAL SPINE FINDINGS Alignment: Within normal limits. Skull base and vertebrae: 7 cervical segments are well visualized. Vertebral body height is well maintained. No acute fracture or acute facet abnormality is noted. The odontoid is within normal limits. Soft tissues and spinal canal: Surrounding soft tissue structures are within normal limits. Upper chest: Visualized lung apices are within normal limits. Other: None IMPRESSION: CT of the head: No acute intracranial abnormality is noted. Persistent mucosal thickening in the paranasal sinuses. CT of cervical spine: No acute abnormality noted. Electronically Signed   By: Inez Catalina M.D.   On: 05/04/2021 02:46   CT Cervical Spine Wo Contrast  Result Date: 05/04/2021 CLINICAL DATA:  Recent fall EXAM: CT HEAD WITHOUT CONTRAST CT CERVICAL SPINE WITHOUT CONTRAST TECHNIQUE: Multidetector CT imaging of the head and cervical spine was performed following the standard protocol without intravenous contrast. Multiplanar CT image reconstructions of the cervical spine were also generated. RADIATION DOSE REDUCTION: This exam was performed according to the departmental dose-optimization program which includes automated exposure control, adjustment of the mA and/or kV according to patient size and/or use of iterative reconstruction technique. COMPARISON:  03/28/2021 FINDINGS: CT HEAD FINDINGS Brain: No evidence of acute infarction, hemorrhage, hydrocephalus, extra-axial collection  or mass lesion/mass effect. Vascular: No hyperdense vessel or unexpected calcification. Skull: Normal. Negative for fracture or focal lesion. Sinuses/Orbits: Mucosal thickening is noted within the ethmoid and maxillary sinuses increased from the prior exam. Other: None CT CERVICAL SPINE FINDINGS Alignment: Within normal limits. Skull base and vertebrae: 7 cervical segments are well visualized. Vertebral body height is well maintained. No acute fracture or acute facet abnormality is noted. The odontoid is within normal limits. Soft tissues and spinal canal: Surrounding soft tissue structures are within normal limits. Upper chest: Visualized lung apices are within normal limits. Other: None IMPRESSION: CT of the head: No acute intracranial abnormality is noted. Persistent mucosal thickening in the paranasal sinuses. CT of cervical spine: No acute abnormality noted. Electronically Signed   By: Inez Catalina M.D.   On: 05/04/2021 02:46   CT ABDOMEN PELVIS W CONTRAST  Result Date: 05/05/2021 CLINICAL DATA:  Abdominal pain, elevated transaminases, trauma, cholecystitis, other EXAM: CT ABDOMEN AND PELVIS WITH CONTRAST TECHNIQUE: Multidetector CT imaging of the abdomen and pelvis was performed using the standard protocol following bolus administration of intravenous contrast. RADIATION DOSE REDUCTION: This exam was performed according to the departmental dose-optimization program which includes automated exposure control, adjustment of the mA and/or kV according to patient size and/or use of iterative reconstruction technique. CONTRAST:  26mL OMNIPAQUE IOHEXOL 300 MG/ML  SOLN COMPARISON:  03/09/2020 FINDINGS: Lower chest: No acute abnormality. Hepatobiliary: No solid liver abnormality is seen. Contracted gallbladder. No gallstones, gallbladder wall thickening, or biliary dilatation. Pancreas: Unremarkable. No pancreatic ductal dilatation or surrounding inflammatory changes. Spleen: Normal in size without significant  abnormality. Adrenals/Urinary Tract: Adrenal glands are unremarkable. Kidneys are normal, without renal calculi, solid lesion, or hydronephrosis. Distended urinary bladder, bladder measuring at least 17 cm. Stomach/Bowel: Stomach is within normal limits. Appendix appears normal. No evidence of bowel wall thickening, distention, or inflammatory changes. Large burden of stool throughout the colon and rectum. Vascular/Lymphatic: Extensive, predominately noncalcific aortic atherosclerosis. No enlarged abdominal or pelvic lymph nodes. Reproductive: No mass or other significant abnormality. Other: No abdominal wall hernia or abnormality. No ascites. Musculoskeletal: No acute or significant osseous findings. IMPRESSION: 1. Distended urinary bladder, bladder measuring at least 17 cm. Correlate for urinary retention. 2. Large burden of stool throughout the colon and rectum. 3. Extensive, predominately noncalcific aortic atherosclerosis, significantly advanced for patient age. Aortic Atherosclerosis (ICD10-I70.0). Electronically Signed   By: Delanna Ahmadi M.D.   On: 05/05/2021 13:16   US Abdomen Limited RUQ (LIVER/GB)  Result Date: 05/05/2021 CLINICAL DATA:  Elevated liver function tests EXAM: ULTRASOUND ABDOMEN LIMITED RIGHT UPPER QUADRANT COMPARISON:  05/05/2021 FINDINGS: Gallbladder: Gallbladder is decompressed. No evidence of cholelithiasis. No sonographic Murphy sign noted by sonographer. Common bile duct: Diameter: 3 mm Liver: No focal lesion identified. Within normal limits in parenchymal echogenicity. Portal vein is patent on color Doppler imaging with normal direction of blood flow towards the liver. Other: None. IMPRESSION: 1.  Unremarkable right upper quadrant ultrasound. Electronically Signed   By: Randa Ngo M.D.   On: 05/05/2021 15:36    EKG: pending for today 04/14/21: NSR with rate of 83    Labs on Admission: I have personally reviewed the available labs and imaging studies at the time of the  admission.  Pertinent labs:   hgb: 11.6 (9.8-12.6),  AST: 590 (105 three weeks ago)  ALT: 789 (188 three weeks ago)   Assessment and Plan: * Elevated LFTs- (present on admission) 36 year old patient who has been boarding in ED fro 2 months now with significantly elevated LFTs and RUQ pain -has history of elevated LFTs dating back to at least a year (AST: 46, ALT: 75). ? Drinking history -significantly worse today, no acute confusion  -CT with no acute findings, US abdomen ordered: wnl and no TTP on exam  -ast: 509 and alt 789 today -check labs: LDH, ceruloplasmin, ANA, hepatitis panel, INR, ferritin, iron panel.  -consult GI for further work up -ischemic (hypotensive episodes) vs. Drug induced vs. Autoimmune?  -continue to trend  Orthostatic hypotension- (present on admission) Has had episodic episodes while in ED past 2 months with intermittent IVF -am cortisol wnl and TSH wnl -will continue gentle IVF overnight  -holding flomax, SSRI could be contributing as well -? If autonomic neuropathy could also be contributing with hx of uncontrolled diabetes   Distended bladder on CT History difficult from patient, but states he has no issues urinating.  He is on flomax.  -check bladder scan -follow I/O  -hold flomax in setting of orthostasis  -cogentin can cause urinary retention, but will get scan and watch I/O and psychiatry input before stopping.   Diabetes mellitus, new onset ([place]) a1c of 5.9 04/2021 Tightly controlled in setting of hypoglycemic events  Stop metformin with diarrhea and decrease insulin down to 20 units  Continue to follow accuchecks and blood sugar for hypoglycemic events that could be contributing to his falls/dizziness   MDD (major depressive disorder), recurrent, severe, with psychosis ([place])- (present on admission) Continue lexapro for now Continue respirodol for now, follow GI recs.  Continue cogentin Psychiatry to follow (will consult  inpatient)  Anemia Stable, iron studies ordered     Advance Care Planning:   Code Status: Full Code   Consults: GI: [place]   DVT Prophylaxis: TED hose/waiting on INR   Family Communication: no family   Severity of Illness: The appropriate patient status for this patient is INPATIENT. Inpatient status is judged to be reasonable and necessary in order to provide the required intensity of service to ensure the patient's safety. The patient's presenting symptoms, physical exam findings, and initial radiographic and laboratory data in the context of their chronic comorbidities is felt to place them at high risk for further clinical deterioration. Furthermore, it is not anticipated that the patient will be medically stable for discharge from the hospital within 2 midnights of admission.   * I certify that at the point of admission it is my clinical judgment that the patient will require inpatient hospital care spanning beyond 2 midnights from the point of admission due to high intensity of service, high risk for further deterioration and high frequency of surveillance required.*  Author: Orma Flaming, MD 05/05/2021 5:11 PM  For on call review [email or web address].

## 2021-05-06 DIAGNOSIS — E119 Type 2 diabetes mellitus without complications: Secondary | ICD-10-CM | POA: Diagnosis not present

## 2021-05-06 DIAGNOSIS — R531 Weakness: Secondary | ICD-10-CM

## 2021-05-06 DIAGNOSIS — N3289 Other specified disorders of bladder: Secondary | ICD-10-CM

## 2021-05-06 DIAGNOSIS — F29 Unspecified psychosis not due to a substance or known physiological condition: Secondary | ICD-10-CM

## 2021-05-06 DIAGNOSIS — D649 Anemia, unspecified: Secondary | ICD-10-CM

## 2021-05-06 DIAGNOSIS — F333 Major depressive disorder, recurrent, severe with psychotic symptoms: Secondary | ICD-10-CM | POA: Diagnosis not present

## 2021-05-06 DIAGNOSIS — R5381 Other malaise: Secondary | ICD-10-CM | POA: Diagnosis present

## 2021-05-06 DIAGNOSIS — R7989 Other specified abnormal findings of blood chemistry: Secondary | ICD-10-CM | POA: Diagnosis not present

## 2021-05-06 LAB — COMPREHENSIVE METABOLIC PANEL
ALT: 680 U/L — ABNORMAL HIGH (ref 0–44)
AST: 464 U/L — ABNORMAL HIGH (ref 15–41)
Albumin: 2.8 g/dL — ABNORMAL LOW (ref 3.5–5.0)
Alkaline Phosphatase: 99 U/L (ref 38–126)
Anion gap: 7 (ref 5–15)
BUN: 14 mg/dL (ref 6–20)
CO2: 29 mmol/L (ref 22–32)
Calcium: 8.4 mg/dL — ABNORMAL LOW (ref 8.9–10.3)
Chloride: 103 mmol/L (ref 98–111)
Creatinine, Ser: 0.86 mg/dL (ref 0.61–1.24)
GFR, Estimated: >60 mL/min (ref >60)
Glucose, Bld: 72 mg/dL (ref 70–99)
Potassium: 3.6 mmol/L (ref 3.5–5.1)
Sodium: 139 mmol/L (ref 135–145)
Total Bilirubin: 0.8 mg/dL (ref 0.3–1.2)
Total Protein: 7.2 g/dL (ref 6.5–8.1)

## 2021-05-06 LAB — CBC
HCT: 33.9 % — ABNORMAL LOW (ref 39.0–52.0)
Hemoglobin: 11.2 g/dL — ABNORMAL LOW (ref 13.0–17.0)
MCH: 30.7 pg (ref 26.0–34.0)
MCHC: 33 g/dL (ref 30.0–36.0)
MCV: 92.9 fL (ref 80.0–100.0)
Platelets: 140 10*3/uL — ABNORMAL LOW (ref 150–400)
RBC: 3.65 MIL/uL — ABNORMAL LOW (ref 4.22–5.81)
RDW: 12.2 % (ref 11.5–15.5)
WBC: 5.2 10*3/uL (ref 4.0–10.5)
nRBC: 0 % (ref 0.0–0.2)

## 2021-05-06 LAB — GLUCOSE, CAPILLARY
Glucose-Capillary: 72 mg/dL (ref 70–99)
Glucose-Capillary: 102 mg/dL — ABNORMAL HIGH (ref 70–99)
Glucose-Capillary: 182 mg/dL — ABNORMAL HIGH (ref 70–99)
Glucose-Capillary: 107 mg/dL — ABNORMAL HIGH (ref 70–99)

## 2021-05-06 LAB — IGG: IgG (Immunoglobin G), Serum: 3070 mg/dL — ABNORMAL HIGH (ref 603–1613)

## 2021-05-06 LAB — ENA+DNA/DS+ANTICH+CENTRO+JO...
Anti JO-1: <0.2 AI (ref 0.0–0.9)
Centromere Ab Screen: <0.2 AI (ref 0.0–0.9)
Chromatin Ab SerPl-aCnc: <0.2 AI (ref 0.0–0.9)
ENA SM Ab Ser-aCnc: <0.2 AI (ref 0.0–0.9)
Ribonucleic Protein: <0.2 AI (ref 0.0–0.9)
SSA (Ro) (ENA) Antibody, IgG: <0.2 AI (ref 0.0–0.9)
SSB (La) (ENA) Antibody, IgG: <0.2 AI (ref 0.0–0.9)
Scleroderma (Scl-70) (ENA) Antibody, IgG: <0.2 AI (ref 0.0–0.9)
ds DNA Ab: 15 IU/mL — ABNORMAL HIGH (ref 0–9)

## 2021-05-06 LAB — HEPATITIS B CORE ANTIBODY, TOTAL: Hep B Core Total Ab: REACTIVE — AB

## 2021-05-06 LAB — ALPHA-1-ANTITRYPSIN: A-1 Antitrypsin, Ser: 140 mg/dL (ref 95–164)

## 2021-05-06 LAB — ANA W/REFLEX IF POSITIVE: Anti Nuclear Antibody (ANA): POSITIVE — AB

## 2021-05-06 LAB — CERULOPLASMIN: Ceruloplasmin: 25.7 mg/dL (ref 16.0–31.0)

## 2021-05-06 MED ORDER — POLYETHYLENE GLYCOL 3350 17 G PO PACK
17.0000 g | PACK | Freq: Every day | ORAL | Status: DC
  Administered 2021-05-06 – 2021-05-15 (×7): 17 g via ORAL
  Filled 2021-05-06 (×8): qty 1

## 2021-05-06 MED ORDER — TAMSULOSIN HCL 0.4 MG PO CAPS
0.4000 mg | ORAL_CAPSULE | Freq: Every day | ORAL | Status: DC
  Administered 2021-05-06 – 2021-05-07 (×2): 0.4 mg via ORAL
  Filled 2021-05-06 (×2): qty 1

## 2021-05-06 MED ORDER — SODIUM CHLORIDE 0.9 % IV SOLN: INTRAVENOUS | Status: DC

## 2021-05-06 MED ORDER — PALIPERIDONE ER 3 MG PO TB24
3.0000 mg | ORAL_TABLET | Freq: Every day | ORAL | Status: DC
  Administered 2021-05-06 – 2021-07-11 (×67): 3 mg via ORAL
  Filled 2021-05-06 (×70): qty 1

## 2021-05-06 NOTE — Consult Note (Signed)
[facility] Face-to-Face Psychiatry Consult   Reason for Consult: Depression/psychosis/?schizophrenia Referring Physician: Dr. Rogers Blocker Patient Identification: Garrett [name] MRN:  [id number] Principal Diagnosis: Elevated LFTs Diagnosis:  Principal Problem:   Elevated LFTs Active Problems:   MDD (major depressive disorder), recurrent, severe, with psychosis ([place])   Diabetes mellitus, new onset ([place])   Orthostatic hypotension   Distended bladder on CT   Anemia   Generalized weakness   Physical deconditioning   Total Time spent with patient: 45 minutes  HPI Assessment Garrett [name] is a 36 y.o. male patient with past psychiatric history of depression with psychosis, medical history of DM 2, orthostatic hypotension, anemia was holding in ED for 2 months waiting for placement.  He was boarding in ED with hypoglycemic episodes, diarrhea and orthostatic hypotension.  Patient now has elevated transaminases. psych consulted for depression/psychosis/?  Schizophrenia.  Chart review shows patient was admitted to [facility] for 14 days in 06/2017 for worsening psychosis and disorganization- he was discharged on Lexapro, Ativan 0.5 twice daily, risperidone 2 mg twice daily and trazodone.  Patient was also admitted to [facility] in 2010 for 20 days when he was discharged on Haldol and Celexa.  Patient current symptoms of minimal interaction (not speaking [place]), slow thought process with history of depression with psychosis are consistent with MDD.  Pt admitted in 2019 for 14 days with Dx MDD/psychotic features vs schizophrenia with similar presentation in 2010 also. 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 Notes from that time period describe pt being minimally verbal, isolative (again in setting of not speaking [place]). There was concern of catatonia in 2019 it does look like he relapsed into catatonia at some  point between 2019 and present as per IM progress notes  pt being nonverbal. CT head (most recent 05/06/20) has been without structural abnormalities. Less likely catatonia as patient scored  0 on [place] Francis catatonia scale.  Denies SI, HI, AVH.  Patient liver enzymes have increased from 1 month ago. Increased liver enzyme likely due to risperidone.  Unlikely due to Lexapro (uncommon).  We will switch risperidone to paliperidone which has less effect on liver enzymes.  Recommend trending liver enzymes to see improvement after stopping trazodone.  Plan to titrate paliperidone if patient tolerates.  Recommendations Safety Patient is not a danger to himself.  Labs reviewed:   AST 464, ALT 680, Hgb 11.2, ferritin 696, saturation ratio 64, ceruloplasmin 25.7, UA negative, HIV negative, LDH 303, U tox negative, alcohol less than 10, salicylate level less than 7.  Hepatitis B surface antigen reactive.  EKG QTc 444, CT abdomen and usg abdomen -wnl -Recommend to trend liver enzymes.  Medications: -Stop risperidone. -Start paliperidone 3 mg nightly.  Plan to titrate to 6 mg if patient tolerates.  -Continue Lexapro 10 mg daily -Recommend to trend liver enzymes.   Disposition: -Per primary Patient does not meet criteria for inpatient hospitalization.  Thank you for the psych consult.  Psychiatry will see patient on Monday to adjust medications.  Subjective:   Patient seen around 10:30; used interpreter ([place] [employer] [phone]) for interview. Discussed our role in his care. He has minimal spontaneous thought (paucity) vs thought blocking although this is difficult to assess through interpreter services. He is oriented to self and situation, does not know the month. He knows it is 2023. Has some difficulty with DOWB (very slow but accurate). He  has been taking these medications for about a month although was first prescribed in ? (2010 is earlierst record). He does not know how old he was when he first saw a  psychiatrist. Thought process is very concrete. He endorses general compliance with medications. He says that his depression is better.  Pt noted to be disheveled with caked-on dandruff on his maroon scrubs. States he does feel safe. Lives with other people, does not have a family. Given prior concerns for catatonia we did do a BFCRS which resulted in a score of 0. Spoke to sitter and eating 100% of meals. Overall picture more c/w MDD than catatonia.    Past Psychiatric History: MDD with psychotic features He has any questions about the plan for  Risk to Self:  no  Risk to Others:  no   Prior Inpatient Therapy:  Yes Prior Outpatient Therapy: No  Past Medical History:  Past Medical History:  Diagnosis Date   Depression    Diabetes mellitus ([place])    History reviewed. No pertinent surgical history. Family History: History reviewed. No pertinent family history. Family Psychiatric  History:  Social History:  Social History   Substance and Sexual Activity  Alcohol Use None     Social History   Substance and Sexual Activity  Drug Use Not on file    Social History   Socioeconomic History   Marital status: Single    Spouse name: Not on file   Number of children: Not on file   Years of education: Not on file   Highest education level: Not on file  Occupational History   Not on file  Tobacco Use   Smoking status: Never   Smokeless tobacco: Never  Substance and Sexual Activity   Alcohol use: Not on file   Drug use: Not on file   Sexual activity: Not on file  Other Topics Concern   Not on file  Social History Narrative   Not on file   Social Determinants of Health   Financial Resource Strain: Not on file  Food Insecurity: Not on file  Transportation Needs: Not on file  Physical Activity: Not on file  Stress: Not on file  Social Connections: Not on file   Additional Social History:    Allergies:  No Known Allergies  Labs:  Results for orders placed or performed  during the hospital encounter of 03/04/21 (from the past 48 hour(s))  CBG monitoring, ED     Status: None   Collection Time: 05/04/21  5:43 PM  Result Value Ref Range   Glucose-Capillary 75 70 - 99 mg/dL    Comment: Glucose reference range applies only to samples taken after fasting for at least 8 hours.  CBG monitoring, ED     Status: Abnormal   Collection Time: 05/04/21  9:37 PM  Result Value Ref Range   Glucose-Capillary 154 (H) 70 - 99 mg/dL    Comment: Glucose reference range applies only to samples taken after fasting for at least 8 hours.  CBG monitoring, ED     Status: Abnormal   Collection Time: 05/05/21  7:45 AM  Result Value Ref Range   Glucose-Capillary 120 (H) 70 - 99 mg/dL    Comment: Glucose reference range applies only to samples taken after fasting for at least 8 hours.  CBC     Status: Abnormal   Collection Time: 05/05/21  8:50 AM  Result Value Ref Range   WBC 5.9 4.0 - 10.5 K/uL   RBC 3.77 (  L) 4.22 - 5.81 MIL/uL   Hemoglobin 11.6 (L) 13.0 - 17.0 g/dL   HCT 35.6 (L) 39.0 - 52.0 %   MCV 94.4 80.0 - 100.0 fL   MCH 30.8 26.0 - 34.0 pg   MCHC 32.6 30.0 - 36.0 g/dL   RDW 12.5 11.5 - 15.5 %   Platelets 149 (L) 150 - 400 K/uL   nRBC 0.0 0.0 - 0.2 %    Comment: Performed at [facility] [address], [place], [place] [address]  Comprehensive metabolic panel     Status: Abnormal   Collection Time: 05/05/21  8:50 AM  Result Value Ref Range   Sodium 136 135 - 145 mmol/L   Potassium 4.4 3.5 - 5.1 mmol/L   Chloride 101 98 - 111 mmol/L   CO2 30 22 - 32 mmol/L   Glucose, Bld 187 (H) 70 - 99 mg/dL    Comment: Glucose reference range applies only to samples taken after fasting for at least 8 hours.   BUN 13 6 - 20 mg/dL   Creatinine, Ser 0.78 0.61 - 1.24 mg/dL   Calcium 8.8 (L) 8.9 - 10.3 mg/dL   Total Protein 7.9 6.5 - 8.1 g/dL   Albumin 3.0 (L) 3.5 - 5.0 g/dL   AST 590 (H) 15 - 41 U/L   ALT 789 (H) 0 - 44 U/L   Alkaline Phosphatase 91 38 - 126 U/L   Total  Bilirubin 1.0 0.3 - 1.2 mg/dL   GFR, Estimated >60 >60 mL/min    Comment: (NOTE) Calculated using the CKD-EPI Creatinine Equation (2021)    Anion gap 5 5 - 15    Comment: Performed at [facility], [place] [address], [place], [place] [address]  CBG monitoring, ED     Status: Abnormal   Collection Time: 05/05/21 12:37 PM  Result Value Ref Range   Glucose-Capillary 133 (H) 70 - 99 mg/dL    Comment: Glucose reference range applies only to samples taken after fasting for at least 8 hours.  Lactate dehydrogenase     Status: Abnormal   Collection Time: 05/05/21  3:20 PM  Result Value Ref Range   LDH 303 (H) 98 - 192 U/L    Comment: Performed at [facility], [place] [address], [place], [place] [address]  Protime-INR     Status: None   Collection Time: 05/05/21  3:20 PM  Result Value Ref Range   Prothrombin Time 13.1 11.4 - 15.2 seconds   INR 1.0 0.8 - 1.2    Comment: (NOTE) INR goal varies based on device and disease states. Performed at [facility], [place] [address] [place], [place] [address]   HIV Antibody (routine testing w rflx)     Status: None   Collection Time: 05/05/21  3:20 PM  Result Value Ref Range   HIV Screen 4th Generation wRfx Non Reactive Non Reactive    Comment: Performed at [facility], [place] [address], [place], [place] [address]  Glucose, capillary     Status: Abnormal   Collection Time: 05/05/21  5:21 PM  Result Value Ref Range   Glucose-Capillary 145 (H) 70 - 99 mg/dL    Comment: Glucose reference range applies only to samples taken after fasting for at least 8 hours.  Urinalysis, Routine w reflex microscopic Urine, Clean Catch     Status: None   Collection Time: 05/05/21  5:44 PM  Result Value Ref Range   Color, Urine YELLOW YELLOW  APPearance CLEAR CLEAR   Specific Gravity, Urine 1.010 1.005 - 1.030   pH 7.0 5.0 - 8.0   Glucose, UA NEGATIVE NEGATIVE mg/dL   Hgb urine dipstick NEGATIVE NEGATIVE   Bilirubin Urine NEGATIVE NEGATIVE    Ketones, ur NEGATIVE NEGATIVE mg/dL   Protein, ur NEGATIVE NEGATIVE mg/dL   Nitrite NEGATIVE NEGATIVE   Leukocytes,Ua NEGATIVE NEGATIVE    Comment: Microscopic not done on urines with negative protein, blood, leukocytes, nitrite, or glucose < 500 mg/dL. Performed at [facility], [place] [address], [place], [place] [address]   Ceruloplasmin     Status: None   Collection Time: 05/05/21  6:26 PM  Result Value Ref Range   Ceruloplasmin 25.7 16.0 - 31.0 mg/dL    Comment: (NOTE) Performed At: [facility] [place], [place] [id number] Rush Farmer MD [id number]   Hepatitis panel, acute     Status: Abnormal   Collection Time: 05/05/21  6:26 PM  Result Value Ref Range   Hepatitis B Surface Ag Reactive (A) NON REACTIVE    Comment: Sample Positive for HBsAg. Result confirmed by neutralization.   HCV Ab NON REACTIVE NON REACTIVE    Comment: (NOTE) Nonreactive HCV antibody screen is consistent with no HCV infections,  unless recent infection is suspected or other evidence exists to indicate HCV infection.     Hep A IgM NON REACTIVE NON REACTIVE   Hep B C IgM NON REACTIVE NON REACTIVE    Comment: Performed at [facility], [place] [address] [place], [place] [address]  Iron and TIBC     Status: None   Collection Time: 05/05/21  6:26 PM  Result Value Ref Range   Iron 120 45 - 182 ug/dL   TIBC 329 250 - 450 ug/dL   Saturation Ratios 36 17.9 - 39.5 %   UIBC 209 ug/dL    Comment: Performed at [facility] [address], [place], [place] [address]  Ferritin     Status: Abnormal   Collection Time: 05/05/21  6:26 PM  Result Value Ref Range   Ferritin 696 (H) 24 - 336 ng/mL    Comment: Performed at [facility], [place] [address] [place], [place] [address]  IgG     Status: Abnormal   Collection Time: 05/05/21  6:26 PM  Result Value Ref Range   IgG (Immunoglobin G), Serum 3,070 (H) 603 - 1,613 mg/dL    Comment: (NOTE) Performed At: [facility] [place], [place] [id number] Rush Farmer MD [id number]   Alpha-1-antitrypsin     Status: None   Collection Time: 05/05/21  6:26 PM  Result Value Ref Range   A-1 Antitrypsin, Ser 140 95 - 164 mg/dL    Comment: (NOTE) Performed At: [facility] [address] [place], [place] [id number] Rush Farmer MD [id number]   Hepatitis B core antibody, total     Status: Abnormal   Collection Time: 05/05/21  6:26 PM  Result Value Ref Range   Hep B Core Total Ab Reactive (A) NON REACTIVE    Comment: Performed at [facility], [place] [address], [place], [place] [address]  Glucose, capillary     Status: Abnormal   Collection Time: 05/05/21  9:43 PM  Result Value Ref Range   Glucose-Capillary 150 (H) 70 - 99 mg/dL    Comment: Glucose reference range applies only to samples taken after fasting for at least 8 hours.  Comprehensive metabolic panel  Status: Abnormal   Collection Time: 05/06/21  2:46 AM  Result Value Ref Range   Sodium 139 135 - 145 mmol/L   Potassium 3.6 3.5 - 5.1 mmol/L    Comment: DELTA CHECK NOTED   Chloride 103 98 - 111 mmol/L   CO2 29 22 - 32 mmol/L   Glucose, Bld 72 70 - 99 mg/dL    Comment: Glucose reference range applies only to samples taken after fasting for at least 8 hours.   BUN 14 6 - 20 mg/dL   Creatinine, Ser 0.86 0.61 - 1.24 mg/dL   Calcium 8.4 (L) 8.9 - 10.3 mg/dL   Total Protein 7.2 6.5 - 8.1 g/dL   Albumin 2.8 (L) 3.5 - 5.0 g/dL   AST 464 (H) 15 - 41 U/L   ALT 680 (H) 0 - 44 U/L   Alkaline Phosphatase 99 38 - 126 U/L   Total Bilirubin 0.8 0.3 - 1.2 mg/dL   GFR, Estimated >60 >60 mL/min    Comment: (NOTE) Calculated using the CKD-EPI Creatinine Equation (2021)    Anion gap 7 5 - 15    Comment: Performed at [facility], [place] [address] [place], [place] [address]  CBC     Status: Abnormal   Collection Time: 05/06/21  2:46 AM  Result Value Ref Range   WBC 5.2 4.0 - 10.5 K/uL   RBC 3.65 (L) 4.22 -  5.81 MIL/uL   Hemoglobin 11.2 (L) 13.0 - 17.0 g/dL   HCT 33.9 (L) 39.0 - 52.0 %   MCV 92.9 80.0 - 100.0 fL   MCH 30.7 26.0 - 34.0 pg   MCHC 33.0 30.0 - 36.0 g/dL   RDW 12.2 11.5 - 15.5 %   Platelets 140 (L) 150 - 400 K/uL   nRBC 0.0 0.0 - 0.2 %    Comment: Performed at [facility], [place] [address] [place], [place] [address]  Glucose, capillary     Status: None   Collection Time: 05/06/21  6:20 AM  Result Value Ref Range   Glucose-Capillary 72 70 - 99 mg/dL    Comment: Glucose reference range applies only to samples taken after fasting for at least 8 hours.  Glucose, capillary     Status: Abnormal   Collection Time: 05/06/21 12:08 PM  Result Value Ref Range   Glucose-Capillary 102 (H) 70 - 99 mg/dL    Comment: Glucose reference range applies only to samples taken after fasting for at least 8 hours.    Current Facility-Administered Medications  Medication Dose Route Frequency Provider Last Rate Last Admin   0.9 %  sodium chloride infusion  250 mL Intravenous PRN Orma Flaming, MD       benztropine (COGENTIN) tablet 0.5 mg  0.5 mg Oral BID Pattricia Boss, MD   0.5 mg at 05/06/21 [id number]   escitalopram (LEXAPRO) tablet 10 mg  10 mg Oral Daily Mesner, Corene Cornea, MD   10 mg at 05/06/21 1233   ibuprofen (ADVIL) tablet 400 mg  400 mg Oral Q8H PRN Mesner, Corene Cornea, MD   400 mg at 05/05/21 1725   insulin aspart (novoLOG) injection 0-9 Units  0-9 Units Subcutaneous TID WC Deno Etienne, DO   1 Units at 05/05/21 1741   LORazepam (ATIVAN) tablet 0.5 mg  0.5 mg Oral QHS Rankin, Shuvon B, NP   0.5 mg at 05/05/21 2219   paliperidone (INVEGA) 24 hr tablet 3 mg  3 mg Oral QHS Armando Reichert, MD  polyethylene glycol (MIRALAX / GLYCOLAX) packet 17 g  17 g Oral Daily Sharyn Creamer, MD       senna-docusate (Senokot-S) tablet 1 tablet  1 tablet Oral QHS Orma Flaming, MD   1 tablet at 05/05/21 2219   sodium chloride flush (NS) 0.9 % injection 3 mL  3 mL Intravenous Q12H Orma Flaming, MD   3 mL at  05/05/21 1741   sodium chloride flush (NS) 0.9 % injection 3 mL  3 mL Intravenous PRN Orma Flaming, MD       tamsulosin [facility]) capsule 0.4 mg  0.4 mg Oral QHS Kathie Dike, MD       thiamine tablet 100 mg  100 mg Oral Daily Mesner, Corene Cornea, MD   100 mg at 05/06/21 1233    Musculoskeletal: Strength & Muscle Tone:  no rigidity Gait & Station:  Deferred Patient leans: Backward            Psychiatric Specialty Exam:  Presentation  General Appearance: Appropriate for Environment  Eye Contact:Good  Speech:Slow  Speech Volume:Decreased  Handedness:Right   Mood and Affect  Mood:Euthymic  Affect:Blunt   Thought Process  Thought Processes:Coherent; Goal Directed  Descriptions of Associations:Intact  Orientation:Partial (He is oriented to self and situation, does not know the month. He knows it is 2023.)  Thought Content:Logical  History of Schizophrenia/Schizoaffective disorder:No  Duration of Psychotic Symptoms:N/A  Hallucinations:Hallucinations: None  Ideas of Reference:None  Suicidal Thoughts:Suicidal Thoughts: No  Homicidal Thoughts:Homicidal Thoughts: No   Sensorium  Memory:Immediate Fair; Remote Poor  Judgment:Fair  Insight:Fair   Executive Functions  Concentration:Good  Attention Span:Good  Recall:Good  Fund of Knowledge:Fair  Language:Fair (Speaks very little [place], interpreter services used.  Answers questions in 1 or 2 words or short sentences)   Psychomotor Activity  Psychomotor Activity:Psychomotor Activity: Decreased   Assets  Assets:Communication Skills; Desire for Improvement; Housing; Social Support   Sleep  Sleep:Sleep: Good   Physical Exam: Physical Exam Neurological:     Comments: He is oriented to self and situation, does not know the month. He knows it is 2023.    Review of Systems  Psychiatric/Behavioral:  Negative for depression, hallucinations and suicidal ideas. The patient is not  nervous/anxious.   Blood pressure 125/89, pulse 87, temperature 98 F (36.7 C), temperature source Oral, resp. rate 18, SpO2 97 %. There is no height or weight on file to calculate BMI.   Armando Reichert, MD 05/06/2021 4:20 PM

## 2021-05-06 NOTE — Consult Note (Shared)
Patient seen around 10:30; used interpreter ([place]) for interview. Discussed our role in his care. He has minimal spontaneous thought (paucity) vs thought blocking although this is difficult to assess through interpreter services. He is oriented to self and ***situation, does not know the month. He knows it is 2023. Has some difficulty with DOWB (very slow but accurate). He has been taking these medications for about a month although was first prescribed in ? (2010 is earlierst record). He does not know how old he was when he first saw a psychiatrist. Thought process is very concrete. He endorses general compliance with medications. He says that his depression is better.   Pt noted to be disheveled with caked-on dandruff on his maroon scrubs. States he does feel safe. Lives with other people, does not have a family. Given prior concerns for catatonia we did do a BFCRS which resulted in a score of *** (pt scoring on ***, *** and ***). Spoke to sitter and eating 100% of meals. Overall picture more c/w MDD than catatonia.    ***   From chart review First available record in 2019 with MDD/psychotic features vs schizophrenia (does reference a similar presentation in 2010 which resulted in a 20 day admission). 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 Sent to ED and later admitted. Notes from that time period describe pt being minimally verbal, isolative (again in setting of not speaking [place]). In 2019 there was concern for catatonia and he was started on scheduled ativan - was discharged  on lexapro 10, ativan 0.5 BID, trazodone 100 mg QHS and a discharge diagnosis of MDD/psychotic features which he was still taking in 2021. It does look like he relapsed into catatonia at some point between 2019 and present as IM progress notes note pt being nonverbal. CT head (most recent 05/06/20) has been without structural  abnormalities.

## 2021-05-06 NOTE — Hospital Course (Addendum)
Garrett [name] is a 36 y.o. male with medical history significant of severe depression with psychosis, T2DM and orthostatic hypotension who had been holding in ED for about 2 months time waiting on placement. He was being cared for by members in the community, but they have returned home and he no longer has care. He has been boarding in ED with some hypoglycemic episodes, diarrhea (resolved after metformin adjusted) and some orthostatic hypotension that continues to persist despite stopping his antihypertensive medication as well as addition of SSRI/mood stabilizing drugs.  While in the ER he also had an isolated episode of hyperglycemia with serum glucose 503 with a normal anion gap.  Not consistent with DKA.  Labs were checked and he was noted to have significantly elevated LFTs and with concomitant orthostasis he was referred for admission.  Currently, social work continues to work on placement.  He continues to have significant orthostasis that is limiting his ability to ambulate.

## 2021-05-06 NOTE — Progress Notes (Signed)
Progress Note   Subjective  Chief Complaint: Elevated liver function, orthostatics Language Garrett [name] [phone] interpretor used   Patient sitting in bed comfortably, just finished breakfast. Denies nausea, vomiting, abdominal pain. No loose stools. Per one-on-one nurse with patient, has had decreased urinary output which is being monitored.    Objective   Vital signs in last 24 hours: Temp:  [97.2 F (36.2 C)-97.9 F (36.6 C)] 97.2 F (36.2 C) (02/03 0900) Pulse Rate:  [74-86] 86 (02/03 0900) Resp:  [15-19] 18 (02/03 0900) BP: (92-155)/(63-108) 92/63 (02/03 0900) SpO2:  [97 %-100 %] 97 % (02/03 0900) Last BM Date: 04/29/21  General:  thin appearing,no acute distress Heart:  regular rate and rhythm Pulm: Clear anteriorly; no wheezing Abdomen:  Soft, non distended,Normal bowel sounds.  no  tenderness Without guarding and Without rebound,  Extremities:  Without edema. Neurologic:  Alert and  oriented x4;  grossly normal neurologically.No asterixis. Psychiatric: Limited responses    Intake/Output from previous day: 02/02 0701 - 02/03 0700 In: 450.6 [I.V.:450.6] Out: 500 [Urine:500] Intake/Output this shift: Total I/O In: 236 [P.O.:236] Out: -   Lab Results: Recent Labs    05/03/21 1500 05/05/21 0850 05/06/21 0246  WBC 7.8 5.9 5.2  HGB 11.8* 11.6* 11.2*  HCT 35.9* 35.6* 33.9*  PLT 171 149* 140*   BMET Recent Labs    05/03/21 1500 05/05/21 0850 05/06/21 0246  NA 138 136 139  K 4.0 4.4 3.6  CL 102 101 103  CO2 30 30 29   GLUCOSE 113* 187* 72  BUN 23* 13 14  CREATININE 0.87 0.78 0.86  CALCIUM 9.1 8.8* 8.4*   LFT Recent Labs    05/06/21 0246  PROT 7.2  ALBUMIN 2.8*  AST 464*  ALT 680*  ALKPHOS 99  BILITOT 0.8   PT/INR Recent Labs    05/05/21 1520  LABPROT 13.1  INR 1.0    Studies/Results: CT ABDOMEN PELVIS W CONTRAST  Result Date: 05/05/2021 CLINICAL DATA:  Abdominal pain, elevated transaminases, trauma, cholecystitis, other EXAM: CT  ABDOMEN AND PELVIS WITH CONTRAST TECHNIQUE: Multidetector CT imaging of the abdomen and pelvis was performed using the standard protocol following bolus administration of intravenous contrast. RADIATION DOSE REDUCTION: This exam was performed according to the departmental dose-optimization program which includes automated exposure control, adjustment of the mA and/or kV according to patient size and/or use of iterative reconstruction technique. CONTRAST:  86mL OMNIPAQUE IOHEXOL 300 MG/ML  SOLN COMPARISON:  03/09/2020 FINDINGS: Lower chest: No acute abnormality. Hepatobiliary: No solid liver abnormality is seen. Contracted gallbladder. No gallstones, gallbladder wall thickening, or biliary dilatation. Pancreas: Unremarkable. No pancreatic ductal dilatation or surrounding inflammatory changes. Spleen: Normal in size without significant abnormality. Adrenals/Urinary Tract: Adrenal glands are unremarkable. Kidneys are normal, without renal calculi, solid lesion, or hydronephrosis. Distended urinary bladder, bladder measuring at least 17 cm. Stomach/Bowel: Stomach is within normal limits. Appendix appears normal. No evidence of bowel wall thickening, distention, or inflammatory changes. Large burden of stool throughout the colon and rectum. Vascular/Lymphatic: Extensive, predominately noncalcific aortic atherosclerosis. No enlarged abdominal or pelvic lymph nodes. Reproductive: No mass or other significant abnormality. Other: No abdominal wall hernia or abnormality. No ascites. Musculoskeletal: No acute or significant osseous findings. IMPRESSION: 1. Distended urinary bladder, bladder measuring at least 17 cm. Correlate for urinary retention. 2. Large burden of stool throughout the colon and rectum. 3. Extensive, predominately noncalcific aortic atherosclerosis, significantly advanced for patient age. Aortic Atherosclerosis (ICD10-I70.0). Electronically Signed   By: Jamse Mead.D.  On: 05/05/2021 13:16   US  Abdomen Limited RUQ (LIVER/GB)  Result Date: 05/05/2021 CLINICAL DATA:  Elevated liver function tests EXAM: ULTRASOUND ABDOMEN LIMITED RIGHT UPPER QUADRANT COMPARISON:  05/05/2021 FINDINGS: Gallbladder: Gallbladder is decompressed. No evidence of cholelithiasis. No sonographic Murphy sign noted by sonographer. Common bile duct: Diameter: 3 mm Liver: No focal lesion identified. Within normal limits in parenchymal echogenicity. Portal vein is patent on color Doppler imaging with normal direction of blood flow towards the liver. Other: None. IMPRESSION: 1. Unremarkable right upper quadrant ultrasound. Electronically Signed   By: Randa Ngo M.D.   On: 05/05/2021 15:36      Impression/Plan:   Elevated LFTs- has history of mild LFT elevation in the past AST 464 (590) ALT 680 (789)  Alkphos 99 TBili 0.8 INR 05/05/2021 1.0  [place] is unremarkable There is not evidence of biliary obstruction based on CT or labs.  Down trending LFT labs Normal :alpha 1 antitrypsin , iron, ceruloplasmin IgG elevated at 3070 Pending anti-smooth muscle, mitochondrial antibodies, ANA with reflex Hepatitis B surface antigen positive, was also positive a year ago, other acute hepatitis labs are negative.  We will get total core antibody and hepatitis B DNA. Still unknown if possible component of previous hepatocellular disease coupled with orthostatic/dili-continue to follow-up on labs.  Continue medications the same for now. -Continue to monitor liver function daily  Normocytic anemia HGB 11.2 (11.6) MCV 92.9  05/05/2021 Iron 120 Ferritin 696 B12 750 Normal iron, TIBC, sat, elevate ferritin possible acute phase?   Thrombocytopenia Platelets 140 Previously has had, very mild, no splenomegaly.   Depression/psychosis Recent addition of medications Awaiting bed placement,  no support system   Diabetes On MF low dose Continue insulin  No future appointments.    LOS: 1 day   Vladimir Crofts  05/06/2021, 11:42  AM

## 2021-05-06 NOTE — Plan of Care (Signed)
  Problem: Clinical Measurements: Goal: Will remain free from infection Outcome: Progressing   Problem: Clinical Measurements: Goal: Diagnostic test results will improve Outcome: Progressing   Problem: Nutrition: Goal: Adequate nutrition will be maintained Outcome: Progressing   

## 2021-05-06 NOTE — Progress Notes (Signed)
Patient admitted to hospital after 61 days in the ED awaiting long term care placement after being abandoned by caregiver. Patient added to DTP list for discharge planning.  Edwin Dada, MSW, LCSW Transitions of Care   Clinical Social Worker II [phone]

## 2021-05-06 NOTE — Progress Notes (Signed)
Progress Note   Patient: Garrett [name] [id number] DOB: 29-Mar-1986 DOA: 03/04/2021     1 DOS: the patient was seen and examined on 05/06/2021   Brief hospital course: Amery Jara is a 36 y.o. male with medical history significant of severe depression with psychosis, T2DM and orthostatic hypotension who has been holding in ED for about 2 months time waiting on placement. He was being cared for by members in the community, but they have returned home and he no longer has care. He has been boarding in ED with some hypoglycemic episodes, diarrhea (resolved after metformin adjusted) and some orthostatic hypotension taht continues to persist despite stopping his antihypertensive medication as well as addition of SSRI/mood stabilizing drugs. Today Ed was rounding on him and due to orthostasis, checked labs and he now has a new transaminitis and right sided abdominal pain. [facility] was called to admit.    He is sitting in the bed comfortably. No complaints. Language translation service used for entire exam/history. He tells me he has had diarrhea a few times a day for 2-3 days with RUQ pain. He can't rate the pain but says it is sharp. No radiation. Not sure if worse with food. Has diarrhea after he eats. He denies any other symptoms. He can't tell me any family history. Lived in [place] until he came here in his 6s. Does have history of smoking and drinking alcohol. Can not tell me how much he drank. Denies drug use.    He denies any fever/chills, vision changes or headaches, no chest pain or palpitations, no shortness of breath or cough, no N/V, no dysuria or difficulty urinating. Denies any change to his urine. No leg swelling.      Vitals today:  Afebrile, bp: 94/73, HR: 84, RR: 16, oxygen: 100% RA Pertinent labs: hgb: 11.6 (9.8-12.6), AST: 590 (105 three weeks ago) ALT: 789 (188 three weeks ago)  Assessment and Plan: * Elevated LFTs- (present on admission) 36 year old patient who has been boarding in ED fro  2 months now with significantly elevated LFTs and RUQ pain -has history of elevated LFTs dating back to at least a year (AST: 46, ALT: 75). ? Drinking history -significantly worse on admission, no acute confusion  -CT with no acute findings, US abdomen ordered: wnl and no TTP on exam  -LFTs in 100s approx 1 month ago, now up to 590/789 AST/ALT on admission -GI following and assisting in work up -ischemic (hypotensive episodes) vs. Drug induced vs. Autoimmune?  -overall appear to have had a mild improvement today HepB surface Ag positive, checking hepB DNA and total core antibody  Anemia Stable, iron studies ordered   Distended bladder on CT History difficult from patient, but states he has no issues urinating.  He is on flomax.  -check bladder scan -follow I/O  -hold flomax in setting of orthostasis  -cogentin can cause urinary retention, -bladder scans are showing approx 400cc urine, but he is able to pass urine -if he continues to have significant post void residual, will need to consider I and O and possibly foley catheter if persists   Orthostatic hypotension- (present on admission) Has had episodic episodes while in ED past 2 months with intermittent IVF -am cortisol wnl and TSH wnl -will continue gentle IVF overnight  -holding flomax, SSRI could be contributing as well -? If autonomic neuropathy could also be contributing with hx of uncontrolled diabetes   Diabetes mellitus, new onset ([place]) a1c of 5.9 04/2021 Tightly controlled in setting  of hypoglycemic events  Stop metformin with diarrhea and decrease insulin down to 20 units  Continue to follow accuchecks and blood sugar for hypoglycemic events that could be contributing to his falls/dizziness  -blood sugars currently stable  MDD (major depressive disorder), recurrent, severe, with psychosis ([place])- (present on admission) Continue lexapro for now Seen by Psychiatry and risperidone discontinued due to concerns for effect  on elevated LFTs Started on paliperidone in its place Continue cogentin         Subjective: says he has some pain in his feet bilaterally. Has some pain in his mid back. He has some pain in his neck with movement. Symptoms have been present for 4 days  Physical Exam: Vitals:   05/06/21 0900 05/06/21 1300 05/06/21 1700 05/06/21 2004  BP: 92/63 125/89 117/77 118/75  Pulse: 86 87 84 83  Resp: 18 18 16 19   Temp: (!) 97.2 F (36.2 C) 98 F (36.7 C) 97.7 F (36.5 C) 98.5 F (36.9 C)  TempSrc: Oral Oral Oral Oral  SpO2: 97% 97% 98% 98%   General exam: Alert, awake, oriented x 3 Respiratory system: Clear to auscultation. Respiratory effort normal. Cardiovascular system:RRR. No murmurs, rubs, gallops. Gastrointestinal system: Abdomen is nondistended, soft and nontender. No organomegaly or masses felt. Normal bowel sounds heard. Central nervous system: Alert and oriented. No focal neurological deficits. Extremities: No C/C/E, +pedal pulses Skin: No rashes, lesions or ulcers Psychiatry: Judgement and insight appear normal. Mood & affect appropriate.    Data Reviewed:  Reviewed LFTs, CBC. Repeat chemistry and CBC ordered for AM  Family Communication: no family present  Disposition: Status is: Inpatient Remains inpatient appropriate because: continued work up for elevated LFTs and unsafe discharge          Planned Discharge Destination: Barriers to discharge: difficult placement     Time spent: 40 minutes  Author: Kathie Dike, MD 05/06/2021 11:00 PM  For on call review [email or web address].

## 2021-05-07 DIAGNOSIS — F333 Major depressive disorder, recurrent, severe with psychotic symptoms: Secondary | ICD-10-CM | POA: Diagnosis not present

## 2021-05-07 DIAGNOSIS — N3289 Other specified disorders of bladder: Secondary | ICD-10-CM | POA: Diagnosis not present

## 2021-05-07 DIAGNOSIS — E119 Type 2 diabetes mellitus without complications: Secondary | ICD-10-CM | POA: Diagnosis not present

## 2021-05-07 DIAGNOSIS — R7989 Other specified abnormal findings of blood chemistry: Secondary | ICD-10-CM | POA: Diagnosis not present

## 2021-05-07 LAB — GLUCOSE, CAPILLARY
Glucose-Capillary: 107 mg/dL — ABNORMAL HIGH (ref 70–99)
Glucose-Capillary: 101 mg/dL — ABNORMAL HIGH (ref 70–99)
Glucose-Capillary: 234 mg/dL — ABNORMAL HIGH (ref 70–99)
Glucose-Capillary: 210 mg/dL — ABNORMAL HIGH (ref 70–99)

## 2021-05-07 LAB — COMPREHENSIVE METABOLIC PANEL
ALT: 639 U/L — ABNORMAL HIGH (ref 0–44)
AST: 377 U/L — ABNORMAL HIGH (ref 15–41)
Albumin: 3 g/dL — ABNORMAL LOW (ref 3.5–5.0)
Alkaline Phosphatase: 105 U/L (ref 38–126)
Anion gap: 6 (ref 5–15)
BUN: 15 mg/dL (ref 6–20)
CO2: 29 mmol/L (ref 22–32)
Calcium: 9.1 mg/dL (ref 8.9–10.3)
Chloride: 101 mmol/L (ref 98–111)
Creatinine, Ser: 0.76 mg/dL (ref 0.61–1.24)
GFR, Estimated: >60 mL/min (ref >60)
Glucose, Bld: 106 mg/dL — ABNORMAL HIGH (ref 70–99)
Potassium: 4.1 mmol/L (ref 3.5–5.1)
Sodium: 136 mmol/L (ref 135–145)
Total Bilirubin: 0.7 mg/dL (ref 0.3–1.2)
Total Protein: 7.8 g/dL (ref 6.5–8.1)

## 2021-05-07 LAB — CBC
HCT: 35.2 % — ABNORMAL LOW (ref 39.0–52.0)
Hemoglobin: 11.3 g/dL — ABNORMAL LOW (ref 13.0–17.0)
MCH: 29.7 pg (ref 26.0–34.0)
MCHC: 32.1 g/dL (ref 30.0–36.0)
MCV: 92.6 fL (ref 80.0–100.0)
Platelets: 150 10*3/uL (ref 150–400)
RBC: 3.8 MIL/uL — ABNORMAL LOW (ref 4.22–5.81)
RDW: 12.5 % (ref 11.5–15.5)
WBC: 6.1 10*3/uL (ref 4.0–10.5)
nRBC: 0 % (ref 0.0–0.2)

## 2021-05-07 LAB — HEPATITIS B DNA, ULTRAQUANTITATIVE, PCR
HBV DNA SERPL PCR-ACNC: 6690000 IU/mL
HBV DNA SERPL PCR-LOG IU: 6.825 log10 IU/mL

## 2021-05-07 NOTE — Progress Notes (Signed)
Progress Note   Patient: Garrett [name] [id number] DOB: Mar 07, 1986 DOA: 03/04/2021     2 DOS: the patient was seen and examined on 05/07/2021   Brief hospital course: Apolinar Guirguis is a 36 y.o. male with medical history significant of severe depression with psychosis, T2DM and orthostatic hypotension who has been holding in ED for about 2 months time waiting on placement. He was being cared for by members in the community, but they have returned home and he no longer has care. He has been boarding in ED with some hypoglycemic episodes, diarrhea (resolved after metformin adjusted) and some orthostatic hypotension taht continues to persist despite stopping his antihypertensive medication as well as addition of SSRI/mood stabilizing drugs. Today Ed was rounding on him and due to orthostasis, checked labs and he now has a new transaminitis and right sided abdominal pain. [facility] was called to admit.    He is sitting in the bed comfortably. No complaints. Language translation service used for entire exam/history. He tells me he has had diarrhea a few times a day for 2-3 days with RUQ pain. He can't rate the pain but says it is sharp. No radiation. Not sure if worse with food. Has diarrhea after he eats. He denies any other symptoms. He can't tell me any family history. Lived in [place] until he came here in his 56s. Does have history of smoking and drinking alcohol. Can not tell me how much he drank. Denies drug use.    He denies any fever/chills, vision changes or headaches, no chest pain or palpitations, no shortness of breath or cough, no N/V, no dysuria or difficulty urinating. Denies any change to his urine. No leg swelling.      Vitals today:  Afebrile, bp: 94/73, HR: 84, RR: 16, oxygen: 100% RA Pertinent labs: hgb: 11.6 (9.8-12.6), AST: 590 (105 three weeks ago) ALT: 789 (188 three weeks ago)  Assessment and Plan: * Elevated LFTs- (present on admission) Overall LFTs trending down Appreciate GI  assistance Current suspicion is for chronic Hep B with worsening of LFTs in the setting of ischemic injury during orthostasis vs. Drug induced liver injury (risperdal) Continue to follow  Anemia Stable, iron studies ordered   Distended bladder on CT History difficult from patient, but states he has no issues urinating.  He is on flomax.  -check bladder scan -follow I/O  -cogentin can cause urinary retention, -bladder scans are showing approx 400cc urine, but he is able to pass urine -if he continues to have significant post void residual, will need to consider I and O and possibly foley catheter if persists   Orthostatic hypotension- (present on admission) Has had episodic episodes while in ED past 2 months with intermittent IVF -am cortisol wnl and TSH wnl -will continue gentle IVF overnight  -holding flomax, SSRI could be contributing as well -? If autonomic neuropathy could also be contributing with hx of uncontrolled diabetes  -repeat orthostatics in AM  Diabetes mellitus, new onset ([place]) a1c of 5.9 04/2021 Tightly controlled in setting of hypoglycemic events  Stop metformin with diarrhea and decrease insulin down to 20 units  Continue to follow accuchecks and blood sugar for hypoglycemic events that could be contributing to his falls/dizziness  -blood sugars currently stable  MDD (major depressive disorder), recurrent, severe, with psychosis ([place])- (present on admission) Continue lexapro for now Seen by Psychiatry and risperidone discontinued due to concerns for effect on elevated LFTs Started on paliperidone in its place Continue cogentin  Subjective: he is having bowel movements, he is able to pass  urine. Dizziness on standing is improving  Physical Exam: Vitals:   05/07/21 0337 05/07/21 0804 05/07/21 1046 05/07/21 1524  BP: (!) 143/102 108/67 121/82 102/61  Pulse: 72 79 76 85  Resp: 17 14 18 15   Temp: (!) 97.5 F (36.4 C) 97.6 F (36.4 C) 97.6 F  (36.4 C) 97.7 F (36.5 C)  TempSrc: Oral Oral Oral Oral  SpO2: 98% 97% 98% 99%   General exam: Alert, awake, oriented x 3 Respiratory system: Clear to auscultation. Respiratory effort normal. Cardiovascular system:RRR. No murmurs, rubs, gallops. Gastrointestinal system: Abdomen is nondistended, soft and nontender. No organomegaly or masses felt. Normal bowel sounds heard. Central nervous system: Alert and oriented. No focal neurological deficits. Extremities: No C/C/E, +pedal pulses Skin: No rashes, lesions or ulcers Psychiatry: Judgement and insight appear normal. Mood & affect appropriate.    Data Reviewed:  Reviewed LFTs   Family Communication: none  Disposition: Status is: Inpatient Remains inpatient appropriate because: needs safe discharge disposition          Planned Discharge Destination: Barriers to discharge: difficult placement     Time spent: 35 minutes  Author: Kathie Dike, MD 05/07/2021 7:35 PM  For on call review [email or web address].

## 2021-05-07 NOTE — Progress Notes (Signed)
Progress Note   Subjective  Chief Complaint: Elevated liver function, orthostatics Language Karen    Patient sitting by window seat. No N/V. Denies nausea, vomiting, abdominal pain. Has had 2 BM's today per nurse, is on miralax    Objective   Vital signs in last 24 hours: Temp:  [97.5 F (36.4 C)-98.5 F (36.9 C)] 97.6 F (36.4 C) (02/04 1046) Pulse Rate:  [72-84] 76 (02/04 1046) Resp:  [14-19] 18 (02/04 1046) BP: (108-143)/(67-102) 121/82 (02/04 1046) SpO2:  [97 %-98 %] 98 % (02/04 1046) Last BM Date: 04/29/21  General:  thin appearing,no acute distress Heart:  regular rate and rhythm Pulm: Clear anteriorly; no wheezing Abdomen:  Soft, non distended,Normal bowel sounds.  no  tenderness Without guarding and Without rebound,  Extremities:  Without edema. Neurologic:  Alert and  oriented;  grossly normal neurologically.No asterixis. Psychiatric: Limited responses    Intake/Output from previous day: 02/03 0701 - 02/04 0700 In: 1123.3 [P.O.:808; I.V.:315.3] Out: 700 [Urine:700] Intake/Output this shift: Total I/O In: 1034.5 [P.O.:480; I.V.:554.5] Out: 300 [Urine:300]  Lab Results: Recent Labs    05/05/21 0850 05/06/21 0246 05/07/21 0146  WBC 5.9 5.2 6.1  HGB 11.6* 11.2* 11.3*  HCT 35.6* 33.9* 35.2*  PLT 149* 140* 150   BMET Recent Labs    05/05/21 0850 05/06/21 0246 05/07/21 0146  NA 136 139 136  K 4.4 3.6 4.1  CL 101 103 101  CO2 30 29 29   GLUCOSE 187* 72 106*  BUN 13 14 15   CREATININE 0.78 0.86 0.76  CALCIUM 8.8* 8.4* 9.1   LFT Recent Labs    05/07/21 0146  PROT 7.8  ALBUMIN 3.0*  AST 377*  ALT 639*  ALKPHOS 105  BILITOT 0.7   PT/INR Recent Labs    05/05/21 1520  LABPROT 13.1  INR 1.0    Studies/Results: US Abdomen Limited RUQ (LIVER/GB)  Result Date: 05/05/2021 CLINICAL DATA:  Elevated liver function tests EXAM: ULTRASOUND ABDOMEN LIMITED RIGHT UPPER QUADRANT COMPARISON:  05/05/2021 FINDINGS: Gallbladder: Gallbladder is  decompressed. No evidence of cholelithiasis. No sonographic Murphy sign noted by sonographer. Common bile duct: Diameter: 3 mm Liver: No focal lesion identified. Within normal limits in parenchymal echogenicity. Portal vein is patent on color Doppler imaging with normal direction of blood flow towards the liver. Other: None. IMPRESSION: 1. Unremarkable right upper quadrant ultrasound. Electronically Signed   By: Randa Ngo M.D.   On: 05/05/2021 15:36      Impression/Plan:   Elevated LFTs- has history of mild LFT elevation in the past AST 377 (464) (590) ALT 639 (680) (789)  Alkphos 105 TBili 0.7 [place] is unremarkable- no evidence of biliary obstruction based on CT or labs.  Normal :alpha 1 antitrypsin , iron, ceruloplasmin IgG elevated at 3070 ANA positive, dsDNA ab positive. Pending anti-smooth muscle, mitochondrial antibodies Hepatitis B surface antigen positive,  total core antibody reactive pending hepatitis B DNA. Still unknown if possible component of previous hepatocellular disease/chronic Hep B coupled with orthostatic/dili -Continue to monitor liver function daily- reassuring Down trending LFTs.  - will monitor patient from a distance as labs continue to return.  Normocytic anemia HGB 11.2 (11.6) MCV 92.9  05/05/2021 Iron 120 Ferritin 696 B12 750 Normal iron, TIBC, sat, elevate ferritin possible acute phase   Thrombocytopenia Platelets 140 Previously has had, very mild, no splenomegaly.   Depression/psychosis Recent addition of medications Awaiting bed placement,  no support system   Diabetes On MF low dose Continue insulin  No future  appointments.    LOS: 2 days   Vladimir Crofts  05/07/2021, 2:06 PM

## 2021-05-08 DIAGNOSIS — F333 Major depressive disorder, recurrent, severe with psychotic symptoms: Secondary | ICD-10-CM | POA: Diagnosis not present

## 2021-05-08 DIAGNOSIS — R7989 Other specified abnormal findings of blood chemistry: Secondary | ICD-10-CM | POA: Diagnosis not present

## 2021-05-08 DIAGNOSIS — N3289 Other specified disorders of bladder: Secondary | ICD-10-CM | POA: Diagnosis not present

## 2021-05-08 DIAGNOSIS — E119 Type 2 diabetes mellitus without complications: Secondary | ICD-10-CM | POA: Diagnosis not present

## 2021-05-08 LAB — GLUCOSE, CAPILLARY
Glucose-Capillary: 130 mg/dL — ABNORMAL HIGH (ref 70–99)
Glucose-Capillary: 188 mg/dL — ABNORMAL HIGH (ref 70–99)
Glucose-Capillary: 197 mg/dL — ABNORMAL HIGH (ref 70–99)
Glucose-Capillary: 143 mg/dL — ABNORMAL HIGH (ref 70–99)

## 2021-05-08 LAB — COMPREHENSIVE METABOLIC PANEL
ALT: 592 U/L — ABNORMAL HIGH (ref 0–44)
AST: 326 U/L — ABNORMAL HIGH (ref 15–41)
Albumin: 2.8 g/dL — ABNORMAL LOW (ref 3.5–5.0)
Alkaline Phosphatase: 99 U/L (ref 38–126)
Anion gap: 5 (ref 5–15)
BUN: 10 mg/dL (ref 6–20)
CO2: 30 mmol/L (ref 22–32)
Calcium: 8.3 mg/dL — ABNORMAL LOW (ref 8.9–10.3)
Chloride: 104 mmol/L (ref 98–111)
Creatinine, Ser: 0.68 mg/dL (ref 0.61–1.24)
GFR, Estimated: >60 mL/min (ref >60)
Glucose, Bld: 163 mg/dL — ABNORMAL HIGH (ref 70–99)
Potassium: 3.6 mmol/L (ref 3.5–5.1)
Sodium: 139 mmol/L (ref 135–145)
Total Bilirubin: 0.5 mg/dL (ref 0.3–1.2)
Total Protein: 7.4 g/dL (ref 6.5–8.1)

## 2021-05-08 LAB — MITOCHONDRIAL ANTIBODIES: Mitochondrial M2 Ab, IgG: 34 Units — ABNORMAL HIGH (ref 0.0–20.0)

## 2021-05-08 LAB — ANTI-SMOOTH MUSCLE ANTIBODY, IGG: F-Actin IgG: 19 Units (ref 0–19)

## 2021-05-08 MED ORDER — COSYNTROPIN 0.25 MG IJ SOLR
0.2500 mg | Freq: Once | INTRAMUSCULAR | Status: AC
Start: 2021-05-09 — End: 2021-05-09
  Administered 2021-05-09: 0.25 mg via INTRAVENOUS
  Filled 2021-05-08: qty 0.25

## 2021-05-08 NOTE — Assessment & Plan Note (Addendum)
PT/OT CSW working on placement

## 2021-05-08 NOTE — Progress Notes (Signed)
Patient orthostatic vital signs in flowsheet and below Lying 134/98 Heart rate 79 Sitting 84/60 heart rate 91 Standing: 57/44 heart rate 102 Patient unable to stand full amount of time to get blood pressure reading, had to sit. Knees were buckling under him and he sat. Assisted patient to lie back down. [place], Randall An RN

## 2021-05-08 NOTE — Progress Notes (Signed)
Progress Note   Patient: Garrett [name] [id number] DOB: 1986/01/31 DOA: 03/04/2021     3 DOS: the patient was seen and examined on 05/08/2021   Brief hospital course: Claris Huger is a 36 y.o. male with medical history significant of severe depression with psychosis, T2DM and orthostatic hypotension who has been holding in ED for about 2 months time waiting on placement. He was being cared for by members in the community, but they have returned home and he no longer has care. He has been boarding in ED with some hypoglycemic episodes, diarrhea (resolved after metformin adjusted) and some orthostatic hypotension taht continues to persist despite stopping his antihypertensive medication as well as addition of SSRI/mood stabilizing drugs. Today Ed was rounding on him and due to orthostasis, checked labs and he now has a new transaminitis and right sided abdominal pain. [facility] was called to admit.    He is sitting in the bed comfortably. No complaints. Language translation service used for entire exam/history. He tells me he has had diarrhea a few times a day for 2-3 days with RUQ pain. He can't rate the pain but says it is sharp. No radiation. Not sure if worse with food. Has diarrhea after he eats. He denies any other symptoms. He can't tell me any family history. Lived in [place] until he came here in his 70s. Does have history of smoking and drinking alcohol. Can not tell me how much he drank. Denies drug use.    He denies any fever/chills, vision changes or headaches, no chest pain or palpitations, no shortness of breath or cough, no N/V, no dysuria or difficulty urinating. Denies any change to his urine. No leg swelling.      Vitals today:  Afebrile, bp: 94/73, HR: 84, RR: 16, oxygen: 100% RA Pertinent labs: hgb: 11.6 (9.8-12.6), AST: 590 (105 three weeks ago) ALT: 789 (188 three weeks ago)  Assessment and Plan: * Elevated LFTs- (present on admission) Overall LFTs trending down Appreciate GI  assistance Current suspicion is for chronic Hep B with worsening of LFTs in the setting of ischemic injury during orthostasis vs. Drug induced liver injury (risperdal) He is found to have a positive ANA and positive ANA.  Since he does have positive IgG, there may be concern for autoimmune hepatitis. Continue to follow for now since LFTs are trending down  Generalized weakness PT/OT  Anemia Stable, iron studies ordered   Distended bladder on CT History difficult from patient, but states he has no issues urinating.  , -bladder scans are showing approx 400cc urine, but he is able to pass urine -if he continues to have significant post void residual, will need to consider I and O and possibly foley catheter if persists   Orthostatic hypotension- (present on admission) Has had episodic episodes while in ED past 2 months with intermittent IVF -TSH noted to be normal, although previous cortisol checked on 04/14/2021 noted to be low at 3.1 -Will check cosyntropin stimulation test -He is continued on IV fluids -He was restarted on Flomax, but will hold this for now -? If autonomic neuropathy could also be contributing with hx of uncontrolled diabetes  -repeat orthostatics in AM  Diabetes mellitus, new onset ([place]) a1c of 5.9 04/2021 Tightly controlled in setting of hypoglycemic events  Stop metformin with diarrhea and decrease insulin down to 20 units  Continue to follow accuchecks and blood sugar for hypoglycemic events that could be contributing to his falls/dizziness  -blood sugars currently stable  MDD (major  depressive disorder), recurrent, severe, with psychosis ([place])- (present on admission) Continue lexapro for now Seen by Psychiatry and risperidone discontinued due to concerns for effect on elevated LFTs Started on paliperidone in its place Continue cogentin         Subjective: He says he is eating and drinking okay.  He became very lightheaded and dizzy on standing earlier  today  Physical Exam: Vitals:   05/08/21 0751 05/08/21 1130 05/08/21 1700 05/08/21 2000  BP: (!) 134/98 103/71 (!) 127/96 119/76  Pulse: 79 79 75 74  Resp: 17 17 17 19   Temp: 97.7 F (36.5 C) 97.8 F (36.6 C) 97.7 F (36.5 C) 98 F (36.7 C)  TempSrc: Oral Oral Oral Oral  SpO2: 99% 98% 99% 99%   General exam: Alert, awake, oriented x 3 Respiratory system: Clear to auscultation. Respiratory effort normal. Cardiovascular system:RRR. No murmurs, rubs, gallops. Gastrointestinal system: Abdomen is nondistended, soft and nontender. No organomegaly or masses felt. Normal bowel sounds heard. Central nervous system: Alert and oriented. No focal neurological deficits. Extremities: No C/C/E, +pedal pulses Skin: No rashes, lesions or ulcers Psychiatry: Judgement and insight appear normal. Mood & affect appropriate.    Data Reviewed:  Chemistry panel including LFTs reviewed.  Serum cortisol level checked last month also reviewed..  Repeat chemistry ordered for tomorrow as well as cosyntropin test.  Family Communication: Discussed with patient  Disposition: Status is: Inpatient Remains inpatient appropriate because: Patient needs safe discharge plan          Planned Discharge Destination: Barriers to discharge: Needs safe discharge plan     Time spent: 35 minutes  Author: Kathie Dike, MD 05/08/2021 8:41 PM  For on call review [email or web address].

## 2021-05-08 NOTE — Progress Notes (Signed)
° °   Progress Note   Subjective  Chief Complaint: Elevated liver function, orthostatics Language Karen    Patient on miralax once daily and senokot at night, has BM yesterday, has some mild lower AB pain. No blood in stool.  No N, V. Eating well.  Continues to have orthostatics.     Objective   Vital signs in last 24 hours: Temp:  [97.4 F (36.3 C)-98 F (36.7 C)] 97.8 F (36.6 C) (02/05 1130) Pulse Rate:  [74-85] 79 (02/05 1130) Resp:  [13-20] 17 (02/05 1130) BP: (102-147)/(61-102) 103/71 (02/05 1130) SpO2:  [99 %-100 %] 99 % (02/05 0751) Last BM Date: 05/07/21  General:  thin appearing,no acute distress Heart:  regular rate and rhythm Pulm: Clear anteriorly; no wheezing Abdomen:  Soft, non distended,Normal bowel sounds.  no tenderness Without guarding and Without rebound,  Extremities:  Without edema. Neurologic:  Alert and  oriented;  grossly normal neurologically.No asterixis. Psychiatric: Limited responses    Intake/Output from previous day: 02/04 0701 - 02/05 0700 In: 2746 [P.O.:840; I.V.:1906] Out: 1300 [Urine:1300] Intake/Output this shift: Total I/O In: 412.3 [P.O.:260; I.V.:152.3] Out: -   Lab Results: Recent Labs    05/06/21 0246 05/07/21 0146  WBC 5.2 6.1  HGB 11.2* 11.3*  HCT 33.9* 35.2*  PLT 140* 150   BMET Recent Labs    05/06/21 0246 05/07/21 0146 05/08/21 0149  NA 139 136 139  K 3.6 4.1 3.6  CL 103 101 104  CO2 29 29 30   GLUCOSE 72 106* 163*  BUN 14 15 10   CREATININE 0.86 0.76 0.68  CALCIUM 8.4* 9.1 8.3*   LFT Recent Labs    05/08/21 0149  PROT 7.4  ALBUMIN 2.8*  AST 326*  ALT 592*  ALKPHOS 99  BILITOT 0.5   PT/INR Recent Labs    05/05/21 1520  LABPROT 13.1  INR 1.0    Studies/Results: No results found.    Impression/Plan:   Elevated LFTs- has history of mild LFT elevation in the past AST 377 (464) (590) ALT 639 (680) (789)  Alkphos 105 TBili 0.7 [place] is unremarkable- no evidence of biliary obstruction based  on CT or labs.  Normal :alpha 1 antitrypsin , iron, ceruloplasmin IgG elevated at 3070 ANA positive, dsDNA ab positive. Pending anti-smooth muscle, mitochondrial antibodies Positive chronic hep B  Likely this episode of LFTs is from chronic hepatitis B with component of orthostasis/DILI Since LFTs are downtrending, this is reassuring, no need for chronic hepatitis B treatment at this time. We will continue to monitor labs from a distance. If any other questions or concerns this hospitalization please feel free to reach back out to [facility]  Normocytic anemia Stable 05/05/2021 Iron 120 Ferritin 696 B12 750  Thrombocytopenia Previously has had, very mild, no splenomegaly.   Depression/psychosis Awaiting bed placement,  no support system   Diabetes On MF low dose Continue insulin  Constipation Normal CT AB pelvis Continue miralax and senokot daily  No future appointments.    LOS: 3 days   Vladimir Crofts  05/08/2021, 11:50 AM

## 2021-05-09 DIAGNOSIS — R531 Weakness: Secondary | ICD-10-CM | POA: Diagnosis not present

## 2021-05-09 DIAGNOSIS — D649 Anemia, unspecified: Secondary | ICD-10-CM | POA: Diagnosis not present

## 2021-05-09 DIAGNOSIS — R7989 Other specified abnormal findings of blood chemistry: Secondary | ICD-10-CM | POA: Diagnosis not present

## 2021-05-09 DIAGNOSIS — F333 Major depressive disorder, recurrent, severe with psychotic symptoms: Secondary | ICD-10-CM | POA: Diagnosis not present

## 2021-05-09 DIAGNOSIS — E119 Type 2 diabetes mellitus without complications: Secondary | ICD-10-CM | POA: Diagnosis not present

## 2021-05-09 DIAGNOSIS — F29 Unspecified psychosis not due to a substance or known physiological condition: Secondary | ICD-10-CM | POA: Diagnosis not present

## 2021-05-09 LAB — ACTH STIMULATION, 3 TIME POINTS
Cortisol, 30 Min: 24.6 ug/dL
Cortisol, 60 Min: 25.7 ug/dL
Cortisol, Base: 11.3 ug/dL

## 2021-05-09 LAB — COMPREHENSIVE METABOLIC PANEL
ALT: 574 U/L — ABNORMAL HIGH (ref 0–44)
AST: 329 U/L — ABNORMAL HIGH (ref 15–41)
Albumin: 3 g/dL — ABNORMAL LOW (ref 3.5–5.0)
Alkaline Phosphatase: 89 U/L (ref 38–126)
Anion gap: 6 (ref 5–15)
BUN: 11 mg/dL (ref 6–20)
CO2: 28 mmol/L (ref 22–32)
Calcium: 8.7 mg/dL — ABNORMAL LOW (ref 8.9–10.3)
Chloride: 104 mmol/L (ref 98–111)
Creatinine, Ser: 0.74 mg/dL (ref 0.61–1.24)
GFR, Estimated: >60 mL/min (ref >60)
Glucose, Bld: 113 mg/dL — ABNORMAL HIGH (ref 70–99)
Potassium: 3.9 mmol/L (ref 3.5–5.1)
Sodium: 138 mmol/L (ref 135–145)
Total Bilirubin: 0.4 mg/dL (ref 0.3–1.2)
Total Protein: 7.7 g/dL (ref 6.5–8.1)

## 2021-05-09 LAB — HEPATITIS PANEL, ACUTE
HCV Ab: NONREACTIVE
Hep A IgM: NONREACTIVE
Hep B C IgM: NONREACTIVE
Hepatitis B Surface Ag: REACTIVE — AB

## 2021-05-09 LAB — GLUCOSE, CAPILLARY
Glucose-Capillary: 146 mg/dL — ABNORMAL HIGH (ref 70–99)
Glucose-Capillary: 236 mg/dL — ABNORMAL HIGH (ref 70–99)
Glucose-Capillary: 174 mg/dL — ABNORMAL HIGH (ref 70–99)
Glucose-Capillary: 191 mg/dL — ABNORMAL HIGH (ref 70–99)

## 2021-05-09 NOTE — NC FL2 (Signed)
[place] MEDICAID FL2 LEVEL OF CARE SCREENING TOOL     IDENTIFICATION  Patient Name: Garrett [name] Birthdate: Sep 29, 1985 Sex: male Admission Date (Current Location): 03/04/2021  [place] and [place] Number:  Kathleen Argue [id number] [place] and Address:  [facility] [place], [place] [address] [place], [place] [address]      Provider Number: [id number]  Attending Physician Name and Address:  Kathie Dike, MD  Relative Name and Phone Number:       Current Level of Care: Hospital Recommended Level of Care: [place] Prior Approval Number:    Date Approved/Denied:   PASRR Number: [id number]  Discharge Plan: Other (Comment) ([place])    Current Diagnoses: Patient Active Problem List   Diagnosis Date Noted   Generalized weakness    Physical deconditioning    Orthostatic hypotension 05/05/2021   Distended bladder on CT 05/05/2021   Anemia 05/05/2021   Altered mental status    Diabetes mellitus, new onset ([place]) 99991111   Acute metabolic encephalopathy [id number]   Elevated LFTs 03/09/2020   Major depressive disorder, recurrent severe without psychotic features ([place]) 06/20/2017   MDD (major depressive disorder), recurrent, severe, with psychosis ([place]) 06/19/2017    Orientation RESPIRATION BLADDER Height & Weight     Self, Time, Place, Situation  Normal Continent Weight:   Height:     BEHAVIORAL SYMPTOMS/MOOD NEUROLOGICAL BOWEL NUTRITION STATUS      Continent Diet (Normal)  AMBULATORY STATUS COMMUNICATION OF NEEDS Skin   Supervision Verbally Normal                       Personal Care Assistance Level of Assistance  Bathing, Feeding, Dressing Bathing Assistance: Limited assistance Feeding assistance: Independent Dressing Assistance: Limited assistance     Functional Limitations Info  Sight, Hearing, Speech Sight Info: Adequate Hearing Info: Adequate Speech Info: Adequate Eugene Garnet)    SPECIAL CARE  FACTORS FREQUENCY                       Contractures Contractures Info: Not present    Additional Factors Info  Code Status, Allergies, Insulin Sliding Scale Code Status Info: Full Allergies Info: No known allergies   Insulin Sliding Scale Info: See discharge summary for medication management       Current Medications (05/09/2021):  This is the current hospital active medication list Current Facility-Administered Medications  Medication Dose Route Frequency Provider Last Rate Last Admin   0.9 %  sodium chloride infusion  250 mL Intravenous PRN Orma Flaming, MD       0.9 %  sodium chloride infusion   Intravenous Continuous Kathie Dike, MD 75 mL/hr at 05/09/21 0846 Infusion Verify at 05/09/21 0846   benztropine (COGENTIN) tablet 0.5 mg  0.5 mg Oral BID Pattricia Boss, MD   0.5 mg at 05/09/21 0631   escitalopram (LEXAPRO) tablet 10 mg  10 mg Oral Daily Mesner, Jason, MD   10 mg at 05/09/21 0801   insulin aspart (novoLOG) injection 0-9 Units  0-9 Units Subcutaneous TID WC Deno Etienne, DO   1 Units at 05/09/21 [id number]   LORazepam (ATIVAN) tablet 0.5 mg  0.5 mg Oral QHS Rankin, Shuvon B, NP   0.5 mg at 05/08/21 2142   paliperidone (INVEGA) 24 hr tablet 3 mg  3 mg Oral QHS Armando Reichert, MD   3 mg at 05/08/21 2142   polyethylene glycol (MIRALAX / GLYCOLAX) packet 17 g  17 g Oral Daily Lorenso Courier,  Grace Blight, MD   17 g at 05/08/21 [id number]   senna-docusate (Senokot-S) tablet 1 tablet  1 tablet Oral QHS Orma Flaming, MD   1 tablet at 05/08/21 2142   sodium chloride flush (NS) 0.9 % injection 3 mL  3 mL Intravenous Q12H Orma Flaming, MD   3 mL at 05/06/21 2124   sodium chloride flush (NS) 0.9 % injection 3 mL  3 mL Intravenous PRN Orma Flaming, MD       thiamine tablet 100 mg  100 mg Oral Daily Mesner, Corene Cornea, MD   100 mg at 05/09/21 0801     Discharge Medications: Please see discharge summary for a list of discharge medications.  Relevant Imaging Results:  Relevant Lab  Results:   Additional Information SSN# [id number]  Archie Endo, LCSW

## 2021-05-09 NOTE — Progress Notes (Addendum)
Progress Note Hospital Day: 62  Chief Complaint:         ASSESSMENT AND PLAN   Brief History:  36 yo [name], from [place], [place] speaking with PMH of severe depression with psychosis, DM2 admitted 05/05/21 with abdominal pain and elevated liver chemistries. Apparently in holding in ED for two months (involuntary) for due to hypoglycemic episodes, orthostatic HTN. No family or anyone in community for care for him. While being housed in ED he developed acute on chronic elevation in liver enzymes.   # Elevated liver enzymes. He has chronic HBV with low grade elevations in liver enzymes but they more than doubled on 04/11/21 with AST of 105 / ALT 188.  Liver tests checked again on 05/05/21 and they were > 10 times ULN ( AST 590 / ALT 798.  --Liver enzymes continue on slow downward trend. Could be secondary to Risperdal though it looks like from reviewing records that he has been on that for quite some time. Ischemic hepatitis from orthostatic hypotension also possible but would expect more rapid improvement in enzymes.  --He has a positive ANA , AMA, and IgG raising concern for AIH. Probably not going to treat as such for now given that numbers are improving.  --Eventual referral to ID for HBV treatment.     SUBJECTIVE   Interpreter used for visit. He has no abdominal pain. Ate breakfast. Had two BMs today. He says the only new med he started in last few months was a diabetes med       OBJECTIVE      Scheduled inpatient medications:   benztropine  0.5 mg Oral BID   escitalopram  10 mg Oral Daily   insulin aspart  0-9 Units Subcutaneous TID WC   LORazepam  0.5 mg Oral QHS   paliperidone  3 mg Oral QHS   polyethylene glycol  17 g Oral Daily   senna-docusate  1 tablet Oral QHS   sodium chloride flush  3 mL Intravenous Q12H   thiamine  100 mg Oral Daily   Continuous inpatient infusions:   sodium chloride     sodium chloride 75 mL/hr at 05/09/21 0846   PRN inpatient  medications: sodium chloride, sodium chloride flush  Vital signs in last 24 hours: Temp:  [97.7 F (36.5 C)-98.8 F (37.1 C)] 97.8 F (36.6 C) (02/06 0800) Pulse Rate:  [73-76] 75 (02/06 0800) Resp:  [12-20] 12 (02/06 0800) BP: (119-143)/(76-101) 121/80 (02/06 0800) SpO2:  [97 %-99 %] 98 % (02/06 0800) Last BM Date: 05/08/21  Intake/Output Summary (Last 24 hours) at 05/09/2021 1214 Last data filed at 05/09/2021 1145 Gross per 24 hour  Intake 2767.1 ml  Output 2951 ml  Net -183.9 ml     Physical Exam:  General: Alert [name] in NAD Heart:  Regular rate and rhythm. No lower extremity edema Pulmonary: Normal respiratory effort Abdomen: Soft, nondistended, nontender. Normal bowel sounds.  Neurologic: Alert and oriented Psych: Pleasant. Cooperative.   There were no vitals filed for this visit.  ntake/Output from previous day: 02/05 0701 - 02/06 0700 In: 2492 [P.O.:860; I.V.:1632] Out: 2250 [Urine:2250] Intake/Output this shift: Total I/O In: 687.4 [P.O.:480; I.V.:207.4] Out: 701 [Urine:700; Stool:1]   DIAGNOSTIC STUDIES THIS ADMISSION:  No results found.     Lab Results: Recent Labs    05/07/21 0146  WBC 6.1  HGB 11.3*  HCT 35.2*  PLT 150   BMET Recent Labs    05/07/21 0146 05/08/21 0149 05/09/21 0812  NA  136 139 138  K 4.1 3.6 3.9  CL 101 104 104  CO2 29 30 28   GLUCOSE 106* 163* 113*  BUN 15 10 11   CREATININE 0.76 0.68 0.74  CALCIUM 9.1 8.3* 8.7*   LFT Recent Labs    05/09/21 0812  PROT 7.7  ALBUMIN 3.0*  AST 329*  ALT 574*  ALKPHOS 89  BILITOT 0.4   PT/INR No results for input(s): LABPROT, INR in the last 72 hours. Hepatitis Panel No results for input(s): HEPBSAG, HCVAB, HEPAIGM, HEPBIGM in the last 72 hours.    Principal Problem:   Elevated LFTs Active Problems:   MDD (major depressive disorder), recurrent, severe, with psychosis ([place])   Diabetes mellitus, new onset ([place])   Orthostatic hypotension   Distended bladder on CT    Anemia   Generalized weakness   Physical deconditioning     LOS: 4 days   Garrett [name] ,NP 05/09/2021, 12:14 PM   I have taken a history, reviewed the chart and examined the patient. I performed a substantive portion of this encounter, including complete performance of at least one of the key components, in conjunction with the APP. I agree with the APP's note, impression and recommendations  36 year old Garrett [name], non-English speaking, admitted with psychosis and severe depression, with recent hypoglycemic episodes and orthostatic hypotension, found to have elevated liver enzymes which peaked at AST 590/ ALT 798 but are down-trending now with no intervention.   Laboratory evaluation notable for chronic hepatitis B infection with viral load 61mil, weakly positive ANA and AMA. I suspect his liver enzymes are most likely secondary to HBV infection.  Given other concomitant factors such as his hypoglycemia/hypotensive episodes and potential drug induced liver injury, and the fact that his enzymes are improving, I would not start anti-viral therapy immediately.  Low suspicion for AIH or PBC based on low titer antibody levels and hepatocellular enzyme pattern elevation.  Also, starting antiviral therapy warrants commitment and faithful adherence to medications which I don't know can be guaranteed at this time.  I would recommend continued monitoring (can decrease to every other day).  If his liver enzymes start to rise again, then treatment may be warranted.  In our system, HBV is typically managed by ID.   GI will continue follow peripherally.  Please contact us if there are any additional questions.   Uyen Eichholz E. Candis Schatz, MD [facility]

## 2021-05-09 NOTE — Progress Notes (Signed)
CSW sent clinicals to Greta at [facility] for review.  Edwin Dada, MSW, LCSW Transitions of Care   Clinical Social Worker II [phone]

## 2021-05-09 NOTE — Evaluation (Signed)
Occupational Therapy Evaluation Patient Details Name: Garrett [name] MRN: [id number] DOB: 1985-12-27 Today's Date: 05/09/2021   History of Present Illness 36 yo male presenting to ED with  withdrawal from friends, lack of intake, not himself with possibly catatonic behavior; thought to be secondary to MDD.   Per nsg pt sustained a fall with unsteady knees early on in his admission. Current issue of hypotension, symptomatic and admitted on 05/05/21 with transaminitis and right sided abdominal pain. PMHx: DM, medication non compliance, AMS, DKA, acute metabolic encephalopathy, MDD   Clinical Impression   Prior to this admission, patient was living with a roommates who were letting him live rent free. Patient did not work and would occassionally heat things up in the microwave, but did not cook. He could complete all his basic care needs independently, and did not drive. Patient has been residing in the ED as his roommates left the country and do not plan on returning. Patient currently limited due to orthostatics, but is able to complete basic grooming ADLs sitting on the edge of the bed, and complete lower body dressing seated. Unable to engage in further ADLs due to low BP with increased sway in standing, although patient reporting no dizziness. Patient placed in chair position in bed in hopes to normalize pressures at end of session. OT recommending SNF rehab as patient is limited in current capabilities due to BP, and problem list outlined below. Therapy will follow acutely in order to address deficits.  BP Levels: Supine: 120/77 (90) Seated EOB: 94/67 (76) Standing 71/48 (55) Seated in chair position with HOB at 37 degrees: 120/86      Recommendations for follow up therapy are one component of a multi-disciplinary discharge planning process, led by the attending physician.  Recommendations may be updated based on patient status, additional functional criteria and insurance authorization.   Follow Up  Recommendations  Skilled nursing-short term rehab (<3 hours/day)    Assistance Recommended at Discharge Frequent or constant Supervision/Assistance  Patient can return home with the following A little help with walking and/or transfers;A little help with bathing/dressing/bathroom;Direct supervision/assist for medications management;Assist for transportation;Direct supervision/assist for financial management;Assistance with cooking/housework    Functional Status Assessment  Patient has had a recent decline in their functional status and demonstrates the ability to make significant improvements in function in a reasonable and predictable amount of time.  Equipment Recommendations  Other (comment) (Defer to next venue of care)    Recommendations for Other Services       Precautions / Restrictions Precautions Precautions: Fall Precaution Comments: Watch BP Restrictions Weight Bearing Restrictions: No      Mobility Bed Mobility Overal bed mobility: Modified Independent Bed Mobility: Sit to Supine, Supine to Sit     Supine to sit: Supervision Sit to supine: Supervision   General bed mobility comments: BP sitting EOB with BP of 94/67 (76)    Transfers Overall transfer level: Needs assistance Equipment used: None Transfers: Sit to/from Stand Sit to Stand: Min assist           General transfer comment: Patient using therapists hand to boost into standing, minimal sway in standing, but reporting no dizziness, BP assessed 71/48 (55)      Balance Overall balance assessment: Needs assistance Sitting-balance support: Feet supported Sitting balance-Leahy Scale: Good  ADL either performed or assessed with clinical judgement   ADL Overall ADL's : Needs assistance/impaired Eating/Feeding: Independent   Grooming: Set up   Upper Body Bathing: Set up   Lower Body Bathing: Set up;Sitting/lateral leans Lower Body Bathing  Details (indicate cue type and reason): Likely would need increased assist due to orthostatics Upper Body Dressing : Set up   Lower Body Dressing: Set up   Toilet Transfer: Minimal assistance;Ambulation Toilet Transfer Details (indicate cue type and reason): Unable to simulate due to low BP in standing Toileting- Clothing Manipulation and Hygiene: Set up       Functional mobility during ADLs: Minimal assistance General ADL Comments: Patient limited due to orthostatics, decreased activity tolerance and endurance.     Vision Baseline Vision/History: 0 No visual deficits Ability to See in Adequate Light: 0 Adequate Patient Visual Report: No change from baseline       Perception     Praxis      Pertinent Vitals/Pain Pain Assessment Pain Assessment: No/denies pain     Hand Dominance     Extremity/Trunk Assessment Upper Extremity Assessment Upper Extremity Assessment: Generalized weakness   Lower Extremity Assessment Lower Extremity Assessment: Defer to PT evaluation   Cervical / Trunk Assessment Cervical / Trunk Assessment: Normal   Communication Communication Communication: Prefers language other than English   Cognition Arousal/Alertness: Awake/alert Behavior During Therapy: Flat affect Overall Cognitive Status: No family/caregiver present to determine baseline cognitive functioning                                 General Comments: Flat affect throughout but participatory     General Comments       Exercises Exercises: General Lower Extremity General Exercises - Lower Extremity Ankle Circles/Pumps: AROM, Both, 10 reps   Shoulder Instructions      Home Living Family/patient expects to be discharged to:: Unsure                                 Additional Comments:  (Patient is now homeless, with caretaker moving to another country, has been residing in the ED since)      Prior Functioning/Environment Prior Level of Function :  Independent/Modified Independent             Mobility Comments: no AD owned or previously used ADLs Comments: did not require assist        OT Problem List: Decreased strength;Decreased range of motion;Decreased activity tolerance;Impaired balance (sitting and/or standing);Decreased cognition;Decreased safety awareness;Pain      OT Treatment/Interventions: Self-care/ADL training;Therapeutic exercise;Energy conservation;Therapeutic activities;Balance training;DME and/or AE instruction    OT Goals(Current goals can be found in the care plan section) Acute Rehab OT Goals Patient Stated Goal: patient did not state OT Goal Formulation: Patient unable to participate in goal setting Time For Goal Achievement: 05/23/21 Potential to Achieve Goals: Fair  OT Frequency: Min 2X/week    Co-evaluation              AM-PAC OT "6 Clicks" Daily Activity     Outcome Measure Help from another person eating meals?: None Help from another person taking care of personal grooming?: A Little Help from another person toileting, which includes using toliet, bedpan, or urinal?: A Little Help from another person bathing (including washing, rinsing, drying)?: A Little Help from another person to put on and taking off regular  upper body clothing?: A Little Help from another person to put on and taking off regular lower body clothing?: A Little 6 Click Score: 19   End of Session Equipment Utilized During Treatment: Gait belt Nurse Communication: Mobility status;Other (comment) (Low BP, left in chair position to hopefully increase BP)  Activity Tolerance: Treatment limited secondary to medical complications (Comment);Patient tolerated treatment well (Low BP, see note) Patient left: in bed;with call bell/phone within reach;with nursing/sitter in room  OT Visit Diagnosis: Unsteadiness on feet (R26.81);Other abnormalities of gait and mobility (R26.89);Muscle weakness (generalized) (M62.81);Dizziness and  giddiness (R42)                Time: 1033-1100 OT Time Calculation (min): 27 min Charges:  OT General Charges $OT Visit: 1 Visit OT Evaluation $OT Eval Moderate Complexity: 1 Mod OT Treatments $Self Care/Home Management : 8-22 mins  Pollyann Glen E. Skylene Deremer, OTR/L Acute Rehabilitation Services [phone]   Cherlyn Cushing 05/09/2021, 11:31 AM

## 2021-05-09 NOTE — Consult Note (Signed)
[facility] Face-to-Face Psychiatry Consult follow-up  Reason for Consult: Depression/psychosis/?schizophrenia Referring Physician: Dr. Rogers Blocker Patient Identification: Garrett [name] MRN:  [id number] Principal Diagnosis: Elevated LFTs Diagnosis:  Principal Problem:   Elevated LFTs Active Problems:   MDD (major depressive disorder), recurrent, severe, with psychosis ([place])   Diabetes mellitus, new onset ([place])   Orthostatic hypotension   Distended bladder on CT   Anemia   Generalized weakness   Physical deconditioning   Total Time spent with patient: 20 minutes  HPI Assessment Garrett [name] is a 36 y.o. male patient with past psychiatric history of depression with psychosis, medical history of DM 2, orthostatic hypotension, anemia was holding in ED for 2 months waiting for placement.  He was boarding in ED with hypoglycemic episodes, diarrhea and orthostatic hypotension.  Patient now has elevated transaminases. psych consulted for depression/psychosis/?  Schizophrenia.  Chart review shows patient was admitted to [facility] for 14 days in 06/2017 for worsening psychosis and disorganization- he was discharged on Lexapro, Ativan 0.5 twice daily, risperidone 2 mg twice daily and trazodone.  Patient was also admitted to [facility] in 2010 for 20 days when he was discharged on Haldol and Celexa. Patient current symptoms of minimal interaction (not speaking [place]), slow thought process with history of depression with psychosis are consistent with MDD.  Pt admitted in 2019 for 14 days with Dx MDD/psychotic features vs schizophrenia with similar presentation in 2010 also. 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 Notes from that time period describe pt being minimally verbal, isolative (again in setting of not speaking [place]). There was concern of catatonia in 2019 it does look like he relapsed into catatonia at  some point between 2019 and present as per IM progress notes  pt being nonverbal. CT head (most recent 05/06/20) has been without structural abnormalities. Less likely catatonia as patient scored  0 on [place] catatonia scale.  Denies SI, HI, AVH.  Patient liver enzymes have increased from 1 month ago. Increased liver enzyme likely due to risperidone.  Unlikely due to Lexapro (uncommon).  We will switch risperidone to paliperidone which has less effect on liver enzymes.  Recommend trending liver enzymes to see improvement after stopping trazodone.   05/09/21-Patient reports good mood.  Denies depression, anxiety, SI, HI, AVH.  He has been tolerating paliperidone well without any side effects.  Liver enzyme continue to improve slowly.  GI also has concerns for autoimmune hepatitis as ANA, AMA, IgG positive.  As patient is not psychotic, we would not increase paliperidone at this time. No safety concerns at this time. Recommend patient to follow-up with outpatient psychiatrist for medication management.  Recommendations Safety Patient is not a danger to himself. Can D/c Sitter.  Labs reviewed:   AST 464, ALT 680, Hgb 11.2, ferritin 696, saturation ratio 64, ceruloplasmin 25.7, UA negative, HIV negative, LDH 303, U tox negative, alcohol less than 10, salicylate level less than 7.  Hepatitis B surface antigen reactive.  EKG QTc 444, CT abdomen and usg abdomen -wnl 2/6 - AST 329, ALT 574-continue to improve slowly. -Recommend to trend liver enzymes.  Medications: -Risperidone stopped due to concerns of liver toxicity. -Continue paliperidone 3 mg nightly.  -Continue Lexapro 10 mg daily -Continue to trend liver enzymes.   Disposition: -Per primary Patient does not meet criteria for inpatient hospitalization.  Thank you for the psych consult.  Psychiatry will  sign off.  Subjective:  Patient seen in afternoon. States he is "just like this" when asked how he is doing today. States his mood is  good. Some speech latency although difficult to assess through interpreter. Denies feelings of depression, anxiety - overall picture more c/w avolition. No thoughts of hurting himself or other people. Denies any hallucinations, paranoia, etc. Per OT notes orthostatic hypotension limiting ability to get out of bed and recommending SNF. Has slept much better since getting paliperidone and has not noted any side effects. He states he is eating well. Knows month, city, year. Is in the hospital because he is not well. Personal hygeine noted to be improved - comb at bedside. Unable to identify whether he had talked to specialists about kidneys, liver, or stomach recently when asked. Thinks he will probably go home after discharge but has no family or anyone he can stay with. Knows he cannot take care of himself.  On exam, no rigidity noted.   Past Psychiatric History: MDD with psychotic features He has any questions about the plan for  Risk to Self:  no  Risk to Others:  no   Prior Inpatient Therapy:  Yes Prior Outpatient Therapy: No  Past Medical History:  Past Medical History:  Diagnosis Date   Depression    Diabetes mellitus ([place])    History reviewed. No pertinent surgical history. Family History: History reviewed. No pertinent family history. Family Psychiatric  History:  Social History:  Social History   Substance and Sexual Activity  Alcohol Use None     Social History   Substance and Sexual Activity  Drug Use Not on file    Social History   Socioeconomic History   Marital status: Single    Spouse name: Not on file   Number of children: Not on file   Years of education: Not on file   Highest education level: Not on file  Occupational History   Not on file  Tobacco Use   Smoking status: Never   Smokeless tobacco: Never  Substance and Sexual Activity   Alcohol use: Not on file   Drug use: Not on file   Sexual activity: Not on file  Other Topics Concern   Not on file   Social History Narrative   Not on file   Social Determinants of Health   Financial Resource Strain: Not on file  Food Insecurity: Not on file  Transportation Needs: Not on file  Physical Activity: Not on file  Stress: Not on file  Social Connections: Not on file   Additional Social History:    Allergies:  No Known Allergies  Labs:  Results for orders placed or performed during the hospital encounter of 03/04/21 (from the past 48 hour(s))  Glucose, capillary     Status: Abnormal   Collection Time: 05/07/21  9:16 PM  Result Value Ref Range   Glucose-Capillary 210 (H) 70 - 99 mg/dL    Comment: Glucose reference range applies only to samples taken after fasting for at least 8 hours.  Comprehensive metabolic panel     Status: Abnormal   Collection Time: 05/08/21  1:49 AM  Result Value Ref Range   Sodium 139 135 - 145 mmol/L   Potassium 3.6 3.5 - 5.1 mmol/L   Chloride 104 98 - 111 mmol/L   CO2 30 22 - 32 mmol/L   Glucose, Bld 163 (H) 70 - 99 mg/dL    Comment: Glucose reference range applies only to samples taken after fasting for  at least 8 hours.   BUN 10 6 - 20 mg/dL   Creatinine, Ser 6.19 0.61 - 1.24 mg/dL   Calcium 8.3 (L) 8.9 - 10.3 mg/dL   Total Protein 7.4 6.5 - 8.1 g/dL   Albumin 2.8 (L) 3.5 - 5.0 g/dL   AST 509 (H) 15 - 41 U/L   ALT 592 (H) 0 - 44 U/L   Alkaline Phosphatase 99 38 - 126 U/L   Total Bilirubin 0.5 0.3 - 1.2 mg/dL   GFR, Estimated >32 >67 mL/min    Comment: (NOTE) Calculated using the CKD-EPI Creatinine Equation (2021)    Anion gap 5 5 - 15    Comment: Performed at [facility], [address], [place], [place] [address]  Glucose, capillary     Status: Abnormal   Collection Time: 05/08/21  6:12 AM  Result Value Ref Range   Glucose-Capillary 130 (H) 70 - 99 mg/dL    Comment: Glucose reference range applies only to samples taken after fasting for at least 8 hours.  Glucose, capillary     Status: Abnormal   Collection Time: 05/08/21 11:44 AM   Result Value Ref Range   Glucose-Capillary 188 (H) 70 - 99 mg/dL    Comment: Glucose reference range applies only to samples taken after fasting for at least 8 hours.  Glucose, capillary     Status: Abnormal   Collection Time: 05/08/21  5:01 PM  Result Value Ref Range   Glucose-Capillary 197 (H) 70 - 99 mg/dL    Comment: Glucose reference range applies only to samples taken after fasting for at least 8 hours.  Glucose, capillary     Status: Abnormal   Collection Time: 05/08/21  9:28 PM  Result Value Ref Range   Glucose-Capillary 143 (H) 70 - 99 mg/dL    Comment: Glucose reference range applies only to samples taken after fasting for at least 8 hours.  Glucose, capillary     Status: Abnormal   Collection Time: 05/09/21  6:25 AM  Result Value Ref Range   Glucose-Capillary 146 (H) 70 - 99 mg/dL    Comment: Glucose reference range applies only to samples taken after fasting for at least 8 hours.  ACTH stimulation, 3 time points     Status: None   Collection Time: 05/09/21  8:12 AM  Result Value Ref Range   Cortisol, Base 11.3 ug/dL    Comment: NO NORMAL RANGE ESTABLISHED FOR THIS TEST   Cortisol, 30 Min 24.6 ug/dL   Cortisol, 60 Min 09.9 ug/dL    Comment: Performed at [facility], [address], [place], [place] [address]  Comprehensive metabolic panel     Status: Abnormal   Collection Time: 05/09/21  8:12 AM  Result Value Ref Range   Sodium 138 135 - 145 mmol/L   Potassium 3.9 3.5 - 5.1 mmol/L   Chloride 104 98 - 111 mmol/L   CO2 28 22 - 32 mmol/L   Glucose, Bld 113 (H) 70 - 99 mg/dL    Comment: Glucose reference range applies only to samples taken after fasting for at least 8 hours.   BUN 11 6 - 20 mg/dL   Creatinine, Ser 5.05 0.61 - 1.24 mg/dL   Calcium 8.7 (L) 8.9 - 10.3 mg/dL   Total Protein 7.7 6.5 - 8.1 g/dL   Albumin 3.0 (L) 3.5 - 5.0 g/dL   AST 397 (H) 15 - 41 U/L   ALT 574 (H) 0 - 44 U/L  Alkaline Phosphatase 89 38 - 126 U/L   Total Bilirubin 0.4 0.3 - 1.2  mg/dL   GFR, Estimated >60 >60 mL/min    Comment: (NOTE) Calculated using the CKD-EPI Creatinine Equation (2021)    Anion gap 6 5 - 15    Comment: Performed at [facility] [address], [place], [place] [address]  Glucose, capillary     Status: Abnormal   Collection Time: 05/09/21 12:00 PM  Result Value Ref Range   Glucose-Capillary 236 (H) 70 - 99 mg/dL    Comment: Glucose reference range applies only to samples taken after fasting for at least 8 hours.  Glucose, capillary     Status: Abnormal   Collection Time: 05/09/21  4:57 PM  Result Value Ref Range   Glucose-Capillary 174 (H) 70 - 99 mg/dL    Comment: Glucose reference range applies only to samples taken after fasting for at least 8 hours.    Current Facility-Administered Medications  Medication Dose Route Frequency Provider Last Rate Last Admin   0.9 %  sodium chloride infusion  250 mL Intravenous PRN Orma Flaming, MD       0.9 %  sodium chloride infusion   Intravenous Continuous Kathie Dike, MD 75 mL/hr at 05/09/21 1826 Infusion Verify at 05/09/21 1826   benztropine (COGENTIN) tablet 0.5 mg  0.5 mg Oral BID Pattricia Boss, MD   0.5 mg at 05/09/21 0631   escitalopram (LEXAPRO) tablet 10 mg  10 mg Oral Daily Mesner, Corene Cornea, MD   10 mg at 05/09/21 0801   insulin aspart (novoLOG) injection 0-9 Units  0-9 Units Subcutaneous TID WC Deno Etienne, DO   2 Units at 05/09/21 1705   LORazepam (ATIVAN) tablet 0.5 mg  0.5 mg Oral QHS Rankin, Shuvon B, NP   0.5 mg at 05/08/21 2142   paliperidone (INVEGA) 24 hr tablet 3 mg  3 mg Oral QHS Rilei Kravitz, Edd Arbour, MD   3 mg at 05/08/21 2142   polyethylene glycol (MIRALAX / GLYCOLAX) packet 17 g  17 g Oral Daily Sharyn Creamer, MD   17 g at 05/08/21 [id number]   senna-docusate (Senokot-S) tablet 1 tablet  1 tablet Oral QHS Orma Flaming, MD   1 tablet at 05/08/21 2142   sodium chloride flush (NS) 0.9 % injection 3 mL  3 mL Intravenous Q12H Orma Flaming, MD   3 mL at 05/06/21 2124   sodium chloride  flush (NS) 0.9 % injection 3 mL  3 mL Intravenous PRN Orma Flaming, MD       thiamine tablet 100 mg  100 mg Oral Daily Mesner, Corene Cornea, MD   100 mg at 05/09/21 0801    Musculoskeletal: Strength & Muscle Tone:  no rigidity Gait & Station:  Deferred Patient leans: Backward            Psychiatric Specialty Exam:  Presentation  General Appearance: Appropriate for Environment (hair clean)  Eye Contact:Good  Speech:Slow  Speech Volume:Decreased  Handedness:Right   Mood and Affect  Mood:Euthymic  Affect:Blunt   Thought Process  Thought Processes:Coherent; Goal Directed  Descriptions of Associations:Intact  Orientation:-- (35, city, year. 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   Review of Systems  Psychiatric/Behavioral:  Negative for depression, hallucinations and suicidal ideas. The patient is not nervous/anxious.   Blood pressure (!) 153/106, pulse 76, temperature 97.8 F (36.6 C), temperature source Oral, resp. rate 18, SpO2 98 %. There is no height or weight on file to calculate  BMI.   Armando Reichert, MD PGy2 05/09/2021 7:02 PM

## 2021-05-09 NOTE — Progress Notes (Signed)
Progress Note   Patient: Garrett [name] [id number] DOB: [birth year] DOA: 03/04/2021     4 DOS: the patient was seen and examined on 05/09/2021   Brief hospital course: Macon Alejandro is a 36 y.o. male with medical history significant of severe depression with psychosis, T2DM and orthostatic hypotension who has been holding in ED for about 2 months time waiting on placement. He was being cared for by members in the community, but they have returned home and he no longer has care. He has been boarding in ED with some hypoglycemic episodes, diarrhea (resolved after metformin adjusted) and some orthostatic hypotension taht continues to persist despite stopping his antihypertensive medication as well as addition of SSRI/mood stabilizing drugs. Today Ed was rounding on him and due to orthostasis, checked labs and he now has a new transaminitis and right sided abdominal pain. [facility] was called to admit.    He is sitting in the bed comfortably. No complaints. Language translation service used for entire exam/history. He tells me he has had diarrhea a few times a day for 2-3 days with RUQ pain. He can't rate the pain but says it is sharp. No radiation. Not sure if worse with food. Has diarrhea after he eats. He denies any other symptoms. He can't tell me any family history. Lived in [place] until he came here in his 67s. Does have history of smoking and drinking alcohol. Can not tell me how much he drank. Denies drug use.    He denies any fever/chills, vision changes or headaches, no chest pain or palpitations, no shortness of breath or cough, no N/V, no dysuria or difficulty urinating. Denies any change to his urine. No leg swelling.      Vitals today:  Afebrile, bp: 94/73, HR: 84, RR: 16, oxygen: 100% RA Pertinent labs: hgb: 11.6 (9.8-12.6), AST: 590 (105 three weeks ago) ALT: 789 (188 three weeks ago)  Assessment and Plan: * Elevated LFTs- (present on admission) Overall LFTs trending down Appreciate GI  assistance Current suspicion is for chronic Hep B with worsening of LFTs in the setting of ischemic injury during orthostasis vs. Drug induced liver injury (risperdal) He will eventually need referral to infectious disease for management of chronic hep B We will check LFTs every other day now.  Generalized weakness PT/OT  Anemia Stable, iron studies ordered   Distended bladder on CT History difficult from patient, but states he has no issues urinating.  , -bladder scans are showing approx 400cc urine, but he is able to pass urine -if he continues to have significant post void residual, will need to consider I and O and possibly foley catheter if persists   Orthostatic hypotension- (present on admission) Has had episodic episodes while in ED past 2 months with intermittent IVF -TSH noted to be normal, although previous cortisol checked on 04/14/2021 noted to be low at 3.1 -Cosyntropin stimulation test checked on 2/6 with baseline cortisol noted to be 11 which would effectively rule out adrenal sufficiency. -Continue to hold Flomax -We will apply TED hose -Discussed with psychiatry, it was felt unlikely that antipsychotics were causing orthostasis. -? If autonomic neuropathy could also be contributing with hx of uncontrolled diabetes  -repeat orthostatics in AM  Diabetes mellitus, new onset ([place]) a1c of 5.9 04/2021 Tightly controlled in setting of hypoglycemic events  Stop metformin with diarrhea and decrease insulin down to 20 units  Continue to follow accuchecks and blood sugar for hypoglycemic events that could be contributing to his falls/dizziness  -blood  sugars currently stable  MDD (major depressive disorder), recurrent, severe, with psychosis ([place])- (present on admission) Continue lexapro for now Seen by Psychiatry and risperidone discontinued due to concerns for effect on elevated LFTs Started on paliperidone in its place Continue cogentin         Subjective: Says  he gets dizzy when he stands up.  He has been noticing this since he was admitted to the hospital, does not report a chronic history of this.  Reports having 2-3 bowel movements per day.  No vomiting.  Stools are soft.  Physical Exam: Vitals:   05/09/21 1051 05/09/21 1243 05/09/21 1658 05/09/21 2007  BP: 120/86  (!) 153/106 (!) 152/103  Pulse:   76 75  Resp: 18  18 17   Temp:  97.8 F (36.6 C)  98 F (36.7 C)  TempSrc:  Oral  Oral  SpO2:   98% 98%   General exam: Alert, awake, oriented x 3 Respiratory system: Clear to auscultation. Respiratory effort normal. Cardiovascular system:RRR. No murmurs, rubs, gallops. Gastrointestinal system: Abdomen is nondistended, soft and nontender. No organomegaly or masses felt. Normal bowel sounds heard. Central nervous system: Alert and oriented. No focal neurological deficits. Extremities: No C/C/E, +pedal pulses Skin: No rashes, lesions or ulcers Psychiatry: Judgement and insight appear normal. Mood & affect appropriate.    Data Reviewed:  Reviewed LFTs, chemistry, ACTH stim test.  Family Communication: Discussed with patient  Disposition: Status is: Inpatient Remains inpatient appropriate because: Persistent orthostasis, unsafe discharge planning          Planned Discharge Destination: Barriers to discharge: Difficult placement     Time spent: 35 minutes  Author: Kathie Dike, MD 05/09/2021 8:59 PM  For on call review [email or web address].

## 2021-05-09 NOTE — Consult Note (Shared)
Patient seen in afternoon. States he is "just like this" when asked how he is doing today. States his mood is good. Some speech latency although difficult to assess through interpreter. Denies feelings of depression, anxiety - overall picture more c/w avolition. No thoughts of hurting himself or other people. Denies any hallucinations, paranoia, etc. Per OT notes orthostatic hypotension limiting ability to get out of bed and recommending SNF. Has slept much better since getting paliperidone and has not noted any side effects. He states he is eating well. Knows month, city, year. Is in the hospital because he is not well. Personal hygeine noted to be improved - comb at bedside. Unable to identify whether he had talked to specialists about kidneys, liver, or stomach recently when asked. Thinks he will probably go home after discharge but has no family or anyone he can stay with. Knows he cannot take care of himself.   On physical exam ***

## 2021-05-10 DIAGNOSIS — F333 Major depressive disorder, recurrent, severe with psychotic symptoms: Secondary | ICD-10-CM | POA: Diagnosis not present

## 2021-05-10 DIAGNOSIS — E119 Type 2 diabetes mellitus without complications: Secondary | ICD-10-CM | POA: Diagnosis not present

## 2021-05-10 DIAGNOSIS — R7989 Other specified abnormal findings of blood chemistry: Secondary | ICD-10-CM | POA: Diagnosis not present

## 2021-05-10 DIAGNOSIS — N3289 Other specified disorders of bladder: Secondary | ICD-10-CM | POA: Diagnosis not present

## 2021-05-10 LAB — GLUCOSE, CAPILLARY
Glucose-Capillary: 146 mg/dL — ABNORMAL HIGH (ref 70–99)
Glucose-Capillary: 192 mg/dL — ABNORMAL HIGH (ref 70–99)
Glucose-Capillary: 197 mg/dL — ABNORMAL HIGH (ref 70–99)
Glucose-Capillary: 77 mg/dL (ref 70–99)

## 2021-05-10 LAB — HEPATIC FUNCTION PANEL
ALT: 552 U/L — ABNORMAL HIGH (ref 0–44)
AST: 262 U/L — ABNORMAL HIGH (ref 15–41)
Albumin: 3.3 g/dL — ABNORMAL LOW (ref 3.5–5.0)
Alkaline Phosphatase: 93 U/L (ref 38–126)
Bilirubin, Direct: <0.1 mg/dL (ref 0.0–0.2)
Total Bilirubin: 0.4 mg/dL (ref 0.3–1.2)
Total Protein: 8.2 g/dL — ABNORMAL HIGH (ref 6.5–8.1)

## 2021-05-10 MED ORDER — MIDODRINE HCL 5 MG PO TABS
5.0000 mg | ORAL_TABLET | Freq: Three times a day (TID) | ORAL | Status: DC
  Administered 2021-05-11 – 2021-05-15 (×14): 5 mg via ORAL
  Filled 2021-05-10 (×14): qty 1

## 2021-05-10 NOTE — Progress Notes (Signed)
Physical Therapy Treatment Patient Details Name: Garrett [name] MRN: [id number] DOB: 03/27/1986 Today's Date: 05/10/2021   History of Present Illness 36 yo male presenting to ED with  withdrawal from friends, lack of intake, not himself with possibly catatonic behavior; thought to be secondary to MDD.   Per nsg pt sustained a fall with unsteady knees early on in his admission. Current issue of hypotension, symptomatic and admitted to floor on 05/05/21 with transaminitis and right sided abdominal pain. PMHx: DM, medication non compliance, AMS, DKA, acute metabolic encephalopathy, MDD    PT Comments    Patient presents with the above due to the impairments below. Patient experienced severe orthostatic hypotensive episodes today upon standing. Initial BP in sitting was 105/79. Began to have increased sway and reported dizziness via interpreter ~15 seconds upon standing. Patient's BP taken in standing with BP of 72/48. After patient instructed to sit down, BP of 80/58. Patient instructed in arm pumps and LAQ in seated position following the drop in BP in standing. Once again, BP taken in sitting after exercises with BP of 92/68. Patient min assist x2 with standing on second attempt experienced increased sway and severe dizziness. Patient min assist to sitting BP of 49/41. PT assisted patient to lay down. BP returned to 96/63. RN notified of severe orthostatic hypotension. Patient functional mobility goals were updated to reflect patient's expected functional mobility at his current level. PT will continue to follow acutely.   Recommendations for follow up therapy are one component of a multi-disciplinary discharge planning process, led by the attending physician.  Recommendations may be updated based on patient status, additional functional criteria and insurance authorization.  Follow Up Recommendations  Skilled nursing-short term rehab (<3 hours/day)     Assistance Recommended at Discharge Frequent or  constant Supervision/Assistance  Patient can return home with the following A lot of help with walking and/or transfers;A little help with bathing/dressing/bathroom;Assistance with cooking/housework;Direct supervision/assist for medications management;Assist for transportation;Help with stairs or ramp for entrance   Equipment Recommendations  None recommended by PT    Recommendations for Other Services       Precautions / Restrictions Precautions Precautions: Fall Precaution Comments: Watch BP Restrictions Weight Bearing Restrictions: No     Mobility  Bed Mobility Overal bed mobility: Needs Assistance Bed Mobility: Sit to Supine, Supine to Sit     Supine to sit: Supervision Sit to supine: Supervision   General bed mobility comments: BP upon sitting initially on EOB was 105/79    Transfers Overall transfer level: Needs assistance Equipment used: 1 person hand held assist Transfers: Sit to/from Stand Sit to Stand: Min assist           General transfer comment: Patient using therapists hand to boost into standing. Began to have increased sway and reported dizziness via interpreter ~15 seconds upon standing. Patient's BP taken in standing with BP of 72/48. After patient instructed to sit down, BP of 80/58. Patient instructed in arm pumps and LAQ in seated position following the drop in BP in standing. Once again, BP taken in sitting after exercises with BP of 92/68. Patient min assist x2 with standing on second attempt experienced increased sway and severe dizziness. Patient min assist to sitting BP of 49/41. PT assisted patient to lay down. BP returned to 96/63. RN notified.    Ambulation/Gait               General Gait Details: not assessed today due to severe orthostatic hypotensive episodes   Stairs  Wheelchair Mobility    Modified Rankin (Stroke Patients Only)       Balance Overall balance assessment: Needs assistance Sitting-balance  support: Feet supported Sitting balance-Leahy Scale: Good     Standing balance support: No upper extremity supported Standing balance-Leahy Scale: Poor Standing balance comment: required min assist x2 for balance on second attempt of standing due to orthostatic hypotension and severe dizziness                            Cognition Arousal/Alertness: Awake/alert Behavior During Therapy: Flat affect Overall Cognitive Status: No family/caregiver present to determine baseline cognitive functioning                                 General Comments: Flat affect throughout but participatory. He was unaware that he had changed rooms since his long stay in ED        Exercises General Exercises - Lower Extremity Long Arc Quad: AROM, Seated, 5 reps, Both Other Exercises Other Exercises: UE shoulder elevation with fist squeezes X20.    General Comments        Pertinent Vitals/Pain Pain Assessment Pain Assessment: No/denies pain    Home Living                          Prior Function            PT Goals (current goals can now be found in the care plan section) Acute Rehab PT Goals Patient Stated Goal: none reported PT Goal Formulation: With patient Time For Goal Achievement: 05/24/21 Potential to Achieve Goals: Fair Progress towards PT goals: Not progressing toward goals - comment (Severe hypotensive episodes are limiting the patient from increasing his functional mobility)    Frequency    Min 2X/week      PT Plan Frequency needs to be updated    Co-evaluation              AM-PAC PT "6 Clicks" Mobility   Outcome Measure  Help needed turning from your back to your side while in a flat bed without using bedrails?: None Help needed moving from lying on your back to sitting on the side of a flat bed without using bedrails?: None Help needed moving to and from a bed to a chair (including a wheelchair)?: A Lot Help needed standing  up from a chair using your arms (e.g., wheelchair or bedside chair)?: A Little Help needed to walk in hospital room?: A Lot Help needed climbing 3-5 steps with a railing? : Total 6 Click Score: 16    End of Session Equipment Utilized During Treatment: Gait belt Activity Tolerance: Treatment limited secondary to medical complications (Comment) (orthostatic hypotension) Patient left: in bed;with call bell/phone within reach Nurse Communication: Mobility status;Other (comment) (orthostatics) PT Visit Diagnosis: Dizziness and giddiness (R42);Adult, failure to thrive (R62.7);Muscle weakness (generalized) (M62.81);Unsteadiness on feet (R26.81);History of falling (Z91.81)     Time: 7902-4097 PT Time Calculation (min) (ACUTE ONLY): 18 min  Charges:  $Therapeutic Activity: 8-22 mins                    Enis Slipper, SPT   [place] 05/10/2021, 4:00 PM

## 2021-05-10 NOTE — Progress Notes (Signed)
Progress Note   Patient: Garrett [name] [id number] DOB: 06-11-1985 DOA: 03/04/2021     5 DOS: the patient was seen and examined on 05/10/2021   Brief hospital course: Garrett [name] is a 36 y.o. male with medical history significant of severe depression with psychosis, T2DM and orthostatic hypotension who had been holding in ED for about 2 months time waiting on placement. He was being cared for by members in the community, but they have returned home and he no longer has care. He has been boarding in ED with some hypoglycemic episodes, diarrhea (resolved after metformin adjusted) and some orthostatic hypotension that continues to persist despite stopping his antihypertensive medication as well as addition of SSRI/mood stabilizing drugs.  Labs were checked and he was noted to have significantly elevated LFTs and with concomitant orthostasis he was referred for admission.  Currently, social work continues to work on placement.  He continues to have significant orthostasis that is limiting his ability to ambulate.     Assessment and Plan: * Elevated LFTs- (present on admission) Overall LFTs trending down Appreciate GI assistance Current suspicion is for chronic Hep B with worsening of LFTs in the setting of ischemic injury during orthostasis vs. Drug induced liver injury (risperdal) He will eventually need referral to infectious disease for management of chronic hep B We will check LFTs every other day now.  Generalized weakness PT/OT CSW working on placement  Anemia Stable, iron studies ordered   Distended bladder on CT History difficult from patient, but states he has no issues urinating.  , -bladder scans are showing approx 400cc urine, but he is able to pass urine -if he continues to have significant post void residual, will need to consider I and O and possibly foley catheter if persists   Orthostatic hypotension- (present on admission) Has had episodic orthostasis while in ED during the  past 2 months and received intermittent IV fluids -TSH noted to be normal, although previous cortisol checked on 04/14/2021 noted to be low at 3.1 -Cosyntropin stimulation test checked on 2/6 with baseline cortisol noted to be 11 which would effectively rule out adrenal sufficiency. -Continue to hold Flomax -continue TED hose -Discussed with psychiatry, it was felt unlikely that antipsychotics were causing orthostasis. -? If autonomic neuropathy could also be contributing with hx of uncontrolled diabetes  -started on midodrine -recheck orthostatics  Diabetes mellitus, new onset ([place]) a1c of 5.9 04/2021 Tightly controlled in setting of hypoglycemic events  Stop metformin with diarrhea  -he is currently on SSI -blood sugars currently stable  MDD (major depressive disorder), recurrent, severe, with psychosis ([place])- (present on admission) Continue lexapro for now Seen by Psychiatry and risperidone discontinued due to concerns for effect on elevated LFTs Started on paliperidone in its place Continue cogentin         Subjective: He says he has had 2 bowel movements today.  Denies any shortness of breath.  He does admit to feeling dizzy on standing, but says this is transient and then resolves.  He says he walked around his bed with physical therapy, although notes from therapy indicate otherwise.  It was reported by therapy that he stood, but had immediately sit down due to dizziness from orthostasis.  Physical Exam: Vitals:   05/10/21 0759 05/10/21 1109 05/10/21 1532 05/10/21 2034  BP: (!) 136/97 (!) 159/105 (!) 172/110 122/84  Pulse:  76 90 72  Resp:  14 18 17   Temp:  98 F (36.7 C) 98.7 F (37.1 C) 98.2 F (36.8  C)  TempSrc:  Oral Oral Oral  SpO2:   99% 99%   General exam: Alert, awake, oriented x 3 Respiratory system: Clear to auscultation. Respiratory effort normal. Cardiovascular system:RRR. No murmurs, rubs, gallops. Gastrointestinal system: Abdomen is nondistended,  soft and nontender. No organomegaly or masses felt. Normal bowel sounds heard. Central nervous system: Alert and oriented. No focal neurological deficits. Extremities: No C/C/E, +pedal pulses Skin: No rashes, lesions or ulcers Psychiatry: Judgement and insight appear normal. Mood & affect appropriate.    Data Reviewed:  Reviewed LFTs  Family Communication: Discussed with patient  Disposition: Status is: Inpatient Remains inpatient appropriate because: Persistent orthostasis, unsafe discharge          Planned Discharge Destination: Barriers to discharge: Difficult placement     Time spent: 35 minutes  Author: Kathie Dike, MD 05/10/2021 9:24 PM  For on call review [email or web address].

## 2021-05-10 NOTE — Progress Notes (Signed)
CSW was notified that there are no beds available at [facility] at this time.  Edwin Dada, MSW, LCSW Transitions of Care   Clinical Social Worker II [phone]

## 2021-05-11 DIAGNOSIS — D696 Thrombocytopenia, unspecified: Secondary | ICD-10-CM | POA: Diagnosis not present

## 2021-05-11 DIAGNOSIS — N3289 Other specified disorders of bladder: Secondary | ICD-10-CM | POA: Diagnosis not present

## 2021-05-11 DIAGNOSIS — I951 Orthostatic hypotension: Secondary | ICD-10-CM

## 2021-05-11 DIAGNOSIS — R7989 Other specified abnormal findings of blood chemistry: Secondary | ICD-10-CM | POA: Diagnosis not present

## 2021-05-11 LAB — COMPREHENSIVE METABOLIC PANEL
ALT: 507 U/L — ABNORMAL HIGH (ref 0–44)
AST: 253 U/L — ABNORMAL HIGH (ref 15–41)
Albumin: 3.2 g/dL — ABNORMAL LOW (ref 3.5–5.0)
Alkaline Phosphatase: 90 U/L (ref 38–126)
Anion gap: 7 (ref 5–15)
BUN: 12 mg/dL (ref 6–20)
CO2: 29 mmol/L (ref 22–32)
Calcium: 9.1 mg/dL (ref 8.9–10.3)
Chloride: 100 mmol/L (ref 98–111)
Creatinine, Ser: 0.77 mg/dL (ref 0.61–1.24)
GFR, Estimated: >60 mL/min (ref >60)
Glucose, Bld: 232 mg/dL — ABNORMAL HIGH (ref 70–99)
Potassium: 3.6 mmol/L (ref 3.5–5.1)
Sodium: 136 mmol/L (ref 135–145)
Total Bilirubin: 0.5 mg/dL (ref 0.3–1.2)
Total Protein: 7.8 g/dL (ref 6.5–8.1)

## 2021-05-11 LAB — GLUCOSE, CAPILLARY
Glucose-Capillary: 161 mg/dL — ABNORMAL HIGH (ref 70–99)
Glucose-Capillary: 247 mg/dL — ABNORMAL HIGH (ref 70–99)
Glucose-Capillary: 200 mg/dL — ABNORMAL HIGH (ref 70–99)
Glucose-Capillary: 88 mg/dL (ref 70–99)

## 2021-05-11 LAB — CBC
HCT: 35.4 % — ABNORMAL LOW (ref 39.0–52.0)
Hemoglobin: 11.6 g/dL — ABNORMAL LOW (ref 13.0–17.0)
MCH: 30.1 pg (ref 26.0–34.0)
MCHC: 32.8 g/dL (ref 30.0–36.0)
MCV: 91.7 fL (ref 80.0–100.0)
Platelets: 136 10*3/uL — ABNORMAL LOW (ref 150–400)
RBC: 3.86 MIL/uL — ABNORMAL LOW (ref 4.22–5.81)
RDW: 12.4 % (ref 11.5–15.5)
WBC: 6.8 10*3/uL (ref 4.0–10.5)
nRBC: 0 % (ref 0.0–0.2)

## 2021-05-11 MED ORDER — ACETAMINOPHEN 325 MG PO TABS
650.0000 mg | ORAL_TABLET | Freq: Four times a day (QID) | ORAL | Status: DC | PRN
  Administered 2021-05-12 – 2021-07-06 (×11): 650 mg via ORAL
  Filled 2021-05-11 (×13): qty 2

## 2021-05-11 NOTE — Progress Notes (Signed)
Bladder scanning showed 465 ml. Pt denied feeling pain or hurting on the lower abdomen. Pt tried to urinate but unsuccessfully. MD notified.   Lawson Radar, RN

## 2021-05-11 NOTE — Progress Notes (Addendum)
Report given to [facility] RN. Pt notified of transfer using translator and verbalizes understanding.  2300: pt transferred to 6N18 w/ all belongings.

## 2021-05-11 NOTE — Assessment & Plan Note (Addendum)
Platelets stable and > 100,000

## 2021-05-11 NOTE — Progress Notes (Signed)
CSW met with patient at bedside with Dyann Ruddle from the [facility] to complete application documents. An interpreter was used for this interaction, #[id number]. Forms were completed and will be hand delivered by [place] to the Disability office.  Madilyn Fireman, MSW, LCSW Transitions of Care   Clinical Social Worker II [phone]

## 2021-05-11 NOTE — Progress Notes (Signed)
PROGRESS NOTE   Garrett [name]  TYO:[id number]    DOB: 1985-07-18    DOA: 03/04/2021  PCP: Hoy Register, MD   I have briefly reviewed patients previous medical records in [facility].  Chief Complaint  Patient presents with   Weakness   Altered Mental Status    Hospital Course:  Garrett [name] is a 36 y.o. male with medical history significant of severe depression with psychosis, T2DM and orthostatic hypotension who had been holding in ED for about 2 months time waiting on placement. He was being cared for by members in the community, but they have returned home and he no longer has care. He has been boarding in ED with some hypoglycemic episodes, diarrhea (resolved after metformin adjusted) and some orthostatic hypotension that continues to persist despite stopping his antihypertensive medication as well as addition of SSRI/mood stabilizing drugs.  Labs were checked and he was noted to have significantly elevated LFTs and with concomitant orthostasis he was referred for admission.  Currently, social work continues to work on placement.  He continues to have significant orthostasis that is limiting his ability to ambulate.      Assessment & Plan:  Principal Problem:   Elevated LFTs Active Problems:   Orthostatic hypotension   Distended bladder on CT   Diabetes mellitus, new onset (HCC)   MDD (major depressive disorder), recurrent, severe, with psychosis (HCC)   Anemia   Thrombocytopenia (HCC)   Physical deconditioning   Assessment and Plan: * Elevated LFTs- (present on admission) Overall LFTs trending down Appreciate GI assistance Current suspicion is for chronic Hep B with worsening of LFTs in the setting of ischemic injury during orthostasis vs. Drug induced liver injury (risperdal) He will eventually need referral to infectious disease for management of chronic hep B Checking LFTs every other day  Orthostatic hypotension- (present on admission) Has had episodic orthostasis  while in ED during the past 2 months and received intermittent IV fluids -TSH noted to be normal, although previous cortisol checked on 04/14/2021 noted to be low at 3.1 -Cosyntropin stimulation test checked on 2/6 with baseline cortisol noted to be 11 which would effectively rule out adrenal sufficiency. -Continue to hold Flomax -continue TED hose -Discussed with psychiatry, it was felt unlikely that antipsychotics were causing orthostasis. -? If autonomic neuropathy could also be contributing with hx of uncontrolled diabetes  -started on midodrine 5 mg 3 times daily -recheck orthostatics-none done thus far today,.  Requested RN to have it performed and documented.  Distended bladder on CT History difficult from patient, but has stated that he has no issues urinating.  , -bladder scans are showing approx 400cc urine, but he is able to pass urine.  Unclear when this occurred. -if he continues to have significant post void residual, will need to consider I and O and possibly foley catheter if persists   Diabetes mellitus, new onset (HCC) a1c of 5.9 04/2021 Tightly controlled in setting of hypoglycemic events  Stopped metformin with diarrhea.  Diarrhea improved. -he is currently on SSI -CBGs mildly uncontrolled and fluctuating between 77- 247 in the last 24 hours.  MDD (major depressive disorder), recurrent, severe, with psychosis (HCC)- (present on admission) Continue lexapro for now Seen by Psychiatry and risperidone discontinued due to concerns for effect on elevated LFTs Started on paliperidone in its place Continue cogentin   Thrombocytopenia (HCC) Mild and intermittent. Follow CBCs periodically.  Anemia Iron panel: Ferritin 696.  B12 in December 2021: 750 and folate 16.5. Hemoglobin stable  in the 11 g range.  Outpatient follow-up  Physical deconditioning- (present on admission) PT recommends SNF.  Patient difficult to place.  Generalized weakness-resolved as of  05/11/2021 PT/OT CSW working on placement      DVT prophylaxis: Place TED hose Start: 05/09/21 2054 Place TED hose Start: 05/05/21 1520     Code Status: Full Code:  Family Communication: None at bedside. Disposition:  Status is: Inpatient Remains inpatient appropriate because: Unsafe discharge and difficult to place.      Consultants:   Psychiatry [facility]  Procedures:   None  Antimicrobials:   None   Subjective:  Patient interviewed and examined utilizing the video interpreter.  Denies complaints.  Diarrhea improved.  Reports no dizziness.  Objective:   Vitals:   05/11/21 0521 05/11/21 0912 05/11/21 1115 05/11/21 1513  BP: (!) 140/95 (!) 109/57 112/78   Pulse: 77 76 78 87  Resp: 14 17 20    Temp: 98.2 F (36.8 C) 97.6 F (36.4 C) 97.6 F (36.4 C) (!) 97.3 F (36.3 C)  TempSrc: Oral Oral Oral Oral  SpO2: 99% 98% 98% 98%    General exam: Young male, small built and thinly nourished lying comfortably supine in bed without distress. Respiratory system: Clear to auscultation. Respiratory effort normal. Cardiovascular system: S1 & S2 heard, RRR. No JVD, murmurs, rubs, gallops or clicks. No pedal edema.  Telemetry personally reviewed: Sinus rhythm. Gastrointestinal system: Abdomen is nondistended, soft and nontender. No organomegaly or masses felt. Normal bowel sounds heard. Central nervous system: Alert and oriented. No focal neurological deficits. Extremities: Symmetric 5 x 5 power.  Bilateral leg TED hose present. Skin: No rashes, lesions or ulcers Psychiatry: Judgement and insight appear impaired. Mood & affect flat.     Data Reviewed:   I have personally reviewed following labs and imaging studies   CBC: Recent Labs  Lab 05/06/21 0246 05/07/21 0146 05/11/21 0245  WBC 5.2 6.1 6.8  HGB 11.2* 11.3* 11.6*  HCT 33.9* 35.2* 35.4*  MCV 92.9 92.6 91.7  PLT 140* 150 136*    Basic Metabolic Panel: Recent Labs  Lab 05/06/21 0246 05/07/21 0146  05/08/21 0149 05/09/21 0812 05/11/21 0245  NA 139 136 139 138 136  K 3.6 4.1 3.6 3.9 3.6  CL 103 101 104 104 100  CO2 29 29 30 28 29   GLUCOSE 72 106* 163* 113* 232*  BUN 14 15 10 11 12   CREATININE 0.86 0.76 0.68 0.74 0.77  CALCIUM 8.4* 9.1 8.3* 8.7* 9.1    Liver Function Tests: Recent Labs  Lab 05/07/21 0146 05/08/21 0149 05/09/21 0812 05/10/21 0934 05/11/21 0245  AST 377* 326* 329* 262* 253*  ALT 639* 592* 574* 552* 507*  ALKPHOS 105 99 89 93 90  BILITOT 0.7 0.5 0.4 0.4 0.5  PROT 7.8 7.4 7.7 8.2* 7.8  ALBUMIN 3.0* 2.8* 3.0* 3.3* 3.2*    CBG: Recent Labs  Lab 05/11/21 0622 05/11/21 1117 05/11/21 1516  GLUCAP 161* 247* 200*    Microbiology Studies:  No results found for this or any previous visit (from the past 240 hour(s)).  Radiology Studies:  No results found.  Scheduled Meds:    benztropine  0.5 mg Oral BID   escitalopram  10 mg Oral Daily   insulin aspart  0-9 Units Subcutaneous TID WC   LORazepam  0.5 mg Oral QHS   midodrine  5 mg Oral TID WC   paliperidone  3 mg Oral QHS   polyethylene glycol  17 g Oral Daily  senna-docusate  1 tablet Oral QHS   sodium chloride flush  3 mL Intravenous Q12H   thiamine  100 mg Oral Daily    Continuous Infusions:    sodium chloride     sodium chloride 75 mL/hr at 05/11/21 1638     LOS: 6 days     Vernell Leep, MD,  FACP, [facility], [facility], [facility] (Care Management Physician Certified) [place]  To contact the attending provider between 7A-7P or the covering provider during after hours 7P-7A, please log into the web site [email or web address] and access using universal [place] password for that web site. If you do not have the password, please call the hospital operator.  05/11/2021, 5:25 PM

## 2021-05-11 NOTE — Progress Notes (Signed)
Occupational Therapy Treatment Patient Details Name: Garrett [name] MRN: [id number] DOB: October 18, 1985 Today's Date: 05/11/2021   History of present illness 36 yo male presenting to ED with  withdrawal from friends, lack of intake, not himself with possibly catatonic behavior; thought to be secondary to MDD.   Per nsg pt sustained a fall with unsteady knees early on in his admission. Current issue of hypotension, symptomatic and admitted to floor on 05/05/21 with transaminitis and right sided abdominal pain. PMHx: DM, medication non compliance, AMS, DKA, acute metabolic encephalopathy, MDD   OT comments  Patient continues to make steady progress towards goals in skilled OT session. Patient's session encompassed ADLs standing at sink, and functional ambulation in the room to increase activity tolerance and independence. Patient only responding with "yes" to all questions, with the exception of stating "no" in regards to dizziness. Patient unable to sequence initial steps for brushing teeth, looking at wrapper and toothpaste, but unable to initiate. Once toothbrush was unwrapped, toothpaste applied, and handed to patient, patient could complete steps. Patient able to wash face once washcloth dampened and handed to him, with multi-modal cues provided. Patients BP assessed at beginning of session, but inaccurate due to patient moving arm. Patients BP 112/78 after ambulation and self care tasks. Patient left sitting EOB with bed alarm on. Therapy will continue to follow.    Recommendations for follow up therapy are one component of a multi-disciplinary discharge planning process, led by the attending physician.  Recommendations may be updated based on patient status, additional functional criteria and insurance authorization.    Follow Up Recommendations  Skilled nursing-short term rehab (<3 hours/day)    Assistance Recommended at Discharge Frequent or constant Supervision/Assistance  Patient can return home with  the following  A little help with walking and/or transfers;A little help with bathing/dressing/bathroom;Direct supervision/assist for medications management;Assist for transportation;Direct supervision/assist for financial management;Assistance with cooking/housework   Equipment Recommendations  Other (comment) (Defer to next venue of care)    Recommendations for Other Services      Precautions / Restrictions Precautions Precautions: Fall Precaution Comments: Watch BP Restrictions Weight Bearing Restrictions: No       Mobility Bed Mobility               General bed mobility comments: Standing upon arrival while NT was remaking bed    Transfers Overall transfer level: Needs assistance Equipment used: 1 person hand held assist Transfers: Sit to/from Stand Sit to Stand: Min assist           General transfer comment: Patient reaching out for surfaces in order to maintain balance when ambulating back to bed, but with stable BPs throughout     Balance Overall balance assessment: Needs assistance Sitting-balance support: Feet supported Sitting balance-Leahy Scale: Good     Standing balance support: No upper extremity supported, Single extremity supported Standing balance-Leahy Scale: Fair                             ADL either performed or assessed with clinical judgement   ADL Overall ADL's : Needs assistance/impaired     Grooming: Oral care;Wash/dry hands;Wash/dry face;Set up                   Toilet Transfer: Minimal assistance;Ambulation Toilet Transfer Details (indicate cue type and reason): Simulated with ambulation around room         Functional mobility during ADLs: Minimal assistance General ADL Comments: Patient  with more stable blood pressures, but continues to demonstrate deficits in activity tolerance and cognition despite using interpreter    Extremity/Trunk Assessment              Vision       Perception      Praxis      Cognition Arousal/Alertness: Awake/alert Behavior During Therapy: Flat affect Overall Cognitive Status: No family/caregiver present to determine baseline cognitive functioning                                 General Comments: Patient only responding with "yes" to all questions, with the exception of stating "no" in regards to dizziness. Patient unable to sequence initial steps for brushing teeth, looking at wrapper and toothpaste, but unable to initiate. Once toothbrush was unwrapped, toothpaste applied, and handed to patient, patient could complete steps. Patient able to wash face once washcloth dampened and handed to him, with multi-modal cues provided.        Exercises      Shoulder Instructions       General Comments      Pertinent Vitals/ Pain       Pain Assessment Pain Assessment: No/denies pain  Home Living                                          Prior Functioning/Environment              Frequency  Min 2X/week        Progress Toward Goals  OT Goals(current goals can now be found in the care plan section)  Progress towards OT goals: Progressing toward goals  Acute Rehab OT Goals Patient Stated Goal: patient did not state OT Goal Formulation: Patient unable to participate in goal setting Time For Goal Achievement: 05/23/21 Potential to Achieve Goals: [place] Discharge plan remains appropriate    Co-evaluation                 AM-PAC OT "6 Clicks" Daily Activity     Outcome Measure   Help from another person eating meals?: None Help from another person taking care of personal grooming?: A Little Help from another person toileting, which includes using toliet, bedpan, or urinal?: A Little Help from another person bathing (including washing, rinsing, drying)?: A Little Help from another person to put on and taking off regular upper body clothing?: A Little Help from another person to put on and  taking off regular lower body clothing?: A Little 6 Click Score: 19    End of Session    OT Visit Diagnosis: Unsteadiness on feet (R26.81);Other abnormalities of gait and mobility (R26.89);Muscle weakness (generalized) (M62.81);Dizziness and giddiness (R42)   Activity Tolerance Patient tolerated treatment well   Patient Left in bed;with call bell/phone within reach;with bed alarm set   Nurse Communication Mobility status;Other (comment) (sitting on EOB with bed alarm set)        Time: [id number] OT Time Calculation (min): 14 min  Charges: OT General Charges $OT Visit: 1 Visit OT Treatments $Self Care/Home Management : 8-22 mins  Corinne Ports E. Jacquis Paxton, OTR/L Acute Rehabilitation Services [phone] [place] 05/11/2021, 11:20 AM

## 2021-05-11 NOTE — Progress Notes (Signed)
334mLs urine emptied from intermittent cath. 57mL residual. Pt w/ relief.

## 2021-05-11 NOTE — Assessment & Plan Note (Addendum)
PT initially recommended SNF but patient mobility has significantly improved although he still has some episodes of dizziness. Physical activity continues to be limited by fatigue. Pt spends most of his days sitting in the bed although able to toilet independently. Previously enc to walk outside of room (using translation svc) On 3/2 communication limited by lack of translating service noting app was being dated.  I was able to communicate with patient that he needed to get out of bed and walk in the hallway regularly.  Because he stays in the bed most of the time this is why he is tired-not sure he understood.  Plan to recommunicate this tomorrow when translating service available  A physical therapy consult is indicated based on the patients mobility assessment.   Mobility Assessment (last 72 hours)    Mobility Assessment    Row Name 05/30/21 2000 05/29/21 1925 05/29/21 0800 05/28/21 1955     Does patient have an order for bedrest or is patient medically unstable No - Continue assessment No - Continue assessment No - Continue assessment No - Continue assessment    What is the highest level of mobility based on the progressive mobility assessment? Level 6 (Walks independently in room and hall) - Balance while walking in room without assist - Complete Level 6 (Walks independently in room and hall) - Balance while walking in room without assist - Complete Level 6 (Walks independently in room and hall) - Balance while walking in room without assist - Complete Level 6 (Walks independently in room and hall) - Balance while walking in room without assist - Complete    Is the above level different from baseline mobility prior to current illness? No - Consider discontinuing PT/OT No - Consider discontinuing PT/OT No - Consider discontinuing PT/OT No - Consider discontinuing PT/OT

## 2021-05-12 DIAGNOSIS — R338 Other retention of urine: Secondary | ICD-10-CM

## 2021-05-12 DIAGNOSIS — B1910 Unspecified viral hepatitis B without hepatic coma: Secondary | ICD-10-CM

## 2021-05-12 HISTORY — DX: Unspecified viral hepatitis B without hepatic coma: B19.10

## 2021-05-12 LAB — GLUCOSE, CAPILLARY
Glucose-Capillary: 179 mg/dL — ABNORMAL HIGH (ref 70–99)
Glucose-Capillary: 241 mg/dL — ABNORMAL HIGH (ref 70–99)
Glucose-Capillary: 263 mg/dL — ABNORMAL HIGH (ref 70–99)
Glucose-Capillary: 177 mg/dL — ABNORMAL HIGH (ref 70–99)

## 2021-05-12 MED ORDER — LINAGLIPTIN 5 MG PO TABS
5.0000 mg | ORAL_TABLET | Freq: Every day | ORAL | Status: DC
  Administered 2021-05-12 – 2021-07-12 (×62): 5 mg via ORAL
  Filled 2021-05-12 (×62): qty 1

## 2021-05-12 MED ORDER — FLUDROCORTISONE ACETATE 0.1 MG PO TABS
0.1000 mg | ORAL_TABLET | Freq: Every day | ORAL | Status: DC
  Administered 2021-05-12 – 2021-05-16 (×5): 0.1 mg via ORAL
  Filled 2021-05-12 (×6): qty 1

## 2021-05-12 NOTE — Assessment & Plan Note (Addendum)
Resolved

## 2021-05-12 NOTE — Progress Notes (Addendum)
No charge progress note  Patient now on the difficult to place Long length of stay team, followed by Ms. Junious Silk, NP.  Seen patient this morning.  RN had just talked to him using a video interpreter.  He reported some suprapubic pressure but denied any other complaints.  Seen patient in the room.  Had completed 100% of his breakfast.  Sitting up and in no distress.  Stable vitals and exam.  Abdomen nondistended, soft and nontender.  Did not appreciate distended bladder.  Acute urinary retention: On 2/8 needed in and out catheterization yielding 350 mL.  This morning RN was yet to perform the bladder scan.  I had discussed with Ms. Rennis Harding and advised her to discuss with urology regarding appropriate medication choices since Flomax or Proscar could not be used given his orthostatics. Since then it appears that patient was able to void spontaneously.  Still with significant orthostatic hypotension, BP dropping from 147/100 supine to 89/56 standing.  Could consider increasing midodrine to 10 mg 3 times daily with close attention to supine hypertension.  Currently Florinef started, follow response.  Rest of management as per Ms. Ellis's note.  Marcellus Scott, MD,  FACP, [facility], [facility], [facility] (Care Management Physician Certified) [facility] Ad[facility] [place]  To contact the attending provider between 7A-7P or the covering provider during after hours 7P-7A, please log into the web site [email or web address] and access using universal [place] password for that web site. If you do not have the password, please call the hospital operator.

## 2021-05-12 NOTE — Assessment & Plan Note (Addendum)
Appreciate GI assistance-suspect chronic Hep B with worsening of LFTs in the setting of ischemic injury during orthostasis vs. Drug induced liver injury (risperdal) After DC needs referral to ID for management of chronic hep B AFP tumor marker slightly elevated at 10 c/w acute exacerbation and hepatitis process -CT abdomen without any solid liver masses GI recommends outpatient follow-up at [facility] hepatology clinic versus with ID here in [place]. Liver biopsy should be considered. -Although may be a candidate for hepatitis B treatment would need to be initiated in the outpatient setting with subsequent insurance authorization to avoid interruption in treatment.  Also has severe depression which may influence decision to initiate hepatitis B medical therapy.

## 2021-05-12 NOTE — Progress Notes (Signed)
Bladder scan showed 348 ml retention. Pt denies any discomfort. Assisted pt up to [facility] and he was able to void 300 ml.

## 2021-05-12 NOTE — Progress Notes (Signed)
Assessment completed with translator services. Translator [facility]sa # [id number].   Pt reports bladder pain and difficulty urinating since yesterday. Reviewed plan of care, including bladder scan and possible I&O cath if unable to void. Pt verbalizes understanding and agreeable to plan.

## 2021-05-12 NOTE — Progress Notes (Addendum)
Progress Note   Patient: Garrett [name] DOB: 10/04/1985 DOA: 03/04/2021     7 DOS: the patient was seen and examined on 05/12/2021   Brief hospital course: Garrett [name] is a 36 y.o. male with medical history significant of severe depression with psychosis, T2DM and orthostatic hypotension who had been holding in ED for about 2 months time waiting on placement. He was being cared for by members in the community, but they have returned home and he no longer has care. He has been boarding in ED with some hypoglycemic episodes, diarrhea (resolved after metformin adjusted) and some orthostatic hypotension that continues to persist despite stopping his antihypertensive medication as well as addition of SSRI/mood stabilizing drugs.  Labs were checked and he was noted to have significantly elevated LFTs and with concomitant orthostasis he was referred for admission.  Currently, social work continues to work on placement.  He continues to have significant orthostasis that is limiting his ability to ambulate.     Assessment and Plan: Controlled type 2 diabetes mellitus without complication, without long-term current use of insulin ([place]) Hga1c of 5.9 04/2021 Tightly controlled in setting of hypoglycemic events  Stopped metformin 2/2 diarrhea.   2/9 begin low-dose Tradjenta Continue SSI Follow CBGs with the addition of Florinef  Hepatitis B Overall LFTs trending down Appreciate GI assistance Current suspicion is for chronic Hep B with worsening of LFTs in the setting of ischemic injury during orthostasis vs. Drug induced liver injury (risperdal) He will eventually need referral to infectious disease for management of chronic hep B Checking LFTs every other day-total bilirubin normal with AST in the 200s and ALTs in the 500  Orthostatic hypotension likely 2/2 autonomic dysfunction- (present on admission) Has had episodic orthostasis while in ED during the past 2 months and received intermittent IV  fluids TSH normal, cortisol checked on 04/14/2021 low at 3.1 but cosyntropin stim test on 2/6 baseline cortisol 11 which ruled out adrenal insufficiency -Continue to hold Flomax -continue TED hose -Discussed with psychiatry, it was felt unlikely that antipsychotics were causing orthostasis. -Continue midodrine 5 mg TID-supine BPs modestly elevated so we will add daily Florinef noting that supine BP 147/100 with sitting BP 108/79 and standing BP 89/56  MDD (major depressive disorder), recurrent, severe, with psychosis ([place])- (present on admission) Appreciate psychiatry assistance Continue lexapro  Risperidone discontinued 2/2 elevated LFTs and Invega initiated by the psychiatric team Continue cogentin Continues to have extremely flat affect and language barrier makes it difficult to effectively allow patient to express feelings and needs.  There may be some cultural issues that are also preventing patient from expressing his emotions   Acute urinary retention- (present on admission) Recurrent incomplete emptying/inability ti void but typucally < 500cc I/O cath prn Flomax dc'd 2/2 orthostasis As of 2/9 patient was able to tell that he needed to void and was able to void successfully  Anemia Iron panel: Ferritin 696.  B12 in December 2021: 750 and folate 16.5. Hemoglobin stable in the 11 g range.  Outpatient follow-up  Thrombocytopenia (HCC) Mild and intermittent. Follow CBCs periodically. Etiology unclear but remain greater than 100,000. Would avoid Depakote in this patient  Physical deconditioning- (present on admission) PT recommends SNF.  Patient difficult to place.  Elevated LFTs See Hepatitis B notation      Subjective:  Patient awake but has very bland and flat affect.  Able to communicate effectively with telemetry translator.  Of note patient's voice is very soft while talking  Physical Exam:  Vitals:   05/12/21 0511 05/12/21 1032 05/12/21 1034 05/12/21 1035  BP:  (!) 128/96 (!) 147/100 108/79 (!) 89/56  Pulse: 79 79 90 95  Resp:  17    Temp: 98.1 F (36.7 C) 98 F (36.7 C)    TempSrc: Oral Oral    SpO2: 100% 100%     General: Calm, NAD Respiratory: Bilateral lung sounds are clear to auscultation. Respiratory effort normal.  Room air Cardiovascular: S1 & S2, RRR containing sinus.  No peripheral edema and remains normotensive at rest with recurrent orthostasis with SBP in the 80s upon standing Gastrointestinal: Abdomen is nondistended, soft and nontender. Normal bowel sounds heard. Neurological: Cranial nerves II through XII are grossly intact, patient able to move all extremities x4 with strength 3+-4/5.  Sensation is intact Skin: No rashes, lesions or ulcers Psychiatry: Judgement and insight appear impaired. Mood & affect flat.  Data Reviewed:  Orthostatic vital signs reviewed and continues to have significant drops between supine and standing blood pressure reading   DVT Prophylaxis   ., Place ted hose  Place ted hose    Family Communication:  Patient only  Disposition: Remains inpatient appropriate because: Unsafe discharge plan-deconditioned with ambulation concerns primarily related to recurrent orthostasis-also uncontrolled diabetes at presentation; severe depression along with language barrier contributing to patient's ability for self-care-care be recommends short-term SNF for rehabilitation; also patient does not have a way to pay for SNF or rehab   A physical therapy consult is indicated based on the patients mobility assessment.   Mobility Assessment (last 72 hours)     Mobility Assessment     Row Name 05/12/21 0030 05/11/21 2117 05/11/21 1119   Does patient have an order for bedrest or is patient medically unstable No - Continue assessment No - Continue assessment --   What is the highest level of mobility based on the progressive mobility assessment? Level 5 (Walks with assist in room/hall) - Balance while stepping  forward/back and can walk in room with assist - Complete Level 5 (Walks with assist in room/hall) - Balance while stepping forward/back and can walk in room with assist - Complete Level 5 (Walks with assist in room/hall) - Balance while stepping forward/back and can walk in room with assist - Complete   Is the above level different from baseline mobility prior to current illness? Yes - Recommend PT order Yes - Recommend PT order --    [place] Name 05/10/21 1359 05/10/21 0900     Does patient have an order for bedrest or is patient medically unstable -- No - Continue assessment    What is the highest level of mobility based on the progressive mobility assessment? Level 2 ([place]) - Balance while sitting on edge of bed and cannot stand Level 3 (Stands with assist) - Balance while standing  and cannot march in place    Is the above level different from baseline mobility prior to current illness? -- Yes - Recommend PT order               Planned Discharge Destination:  [place] vaccination status:  Unknown  Consultants: Psychiatry Gastroenterology Procedures: Echocardiogram Antibiotics: None    Time spent: 5 minutes  Author: Erin Hearing, NP 05/12/2021 12:12 PM  For on call review [email or web address].

## 2021-05-13 DIAGNOSIS — F333 Major depressive disorder, recurrent, severe with psychotic symptoms: Secondary | ICD-10-CM | POA: Diagnosis not present

## 2021-05-13 DIAGNOSIS — R5381 Other malaise: Secondary | ICD-10-CM

## 2021-05-13 DIAGNOSIS — I951 Orthostatic hypotension: Secondary | ICD-10-CM | POA: Diagnosis not present

## 2021-05-13 DIAGNOSIS — E119 Type 2 diabetes mellitus without complications: Secondary | ICD-10-CM | POA: Diagnosis not present

## 2021-05-13 DIAGNOSIS — R338 Other retention of urine: Secondary | ICD-10-CM | POA: Diagnosis not present

## 2021-05-13 LAB — GLUCOSE, CAPILLARY
Glucose-Capillary: 144 mg/dL — ABNORMAL HIGH (ref 70–99)
Glucose-Capillary: 199 mg/dL — ABNORMAL HIGH (ref 70–99)
Glucose-Capillary: 248 mg/dL — ABNORMAL HIGH (ref 70–99)
Glucose-Capillary: 191 mg/dL — ABNORMAL HIGH (ref 70–99)

## 2021-05-13 NOTE — Progress Notes (Addendum)
Progress Note   Patient: Garrett [name] [id number] DOB: [birth year] DOA: 03/04/2021     8 DOS: the patient was seen and examined on 05/13/2021   Brief hospital course: Garrett [name] is a 36 y.o. male with medical history significant of severe depression with psychosis, T2DM and orthostatic hypotension who had been holding in ED for about 2 months time waiting on placement. He was being cared for by members in the community, but they have returned home and he no longer has care. He has been boarding in ED with some hypoglycemic episodes, diarrhea (resolved after metformin adjusted) and some orthostatic hypotension that continues to persist despite stopping his antihypertensive medication as well as addition of SSRI/mood stabilizing drugs.  While in the ER he also had an isolated episode of hyperglycemia with serum glucose 503 with a normal anion gap.  Not consistent with DKA.  Labs were checked and he was noted to have significantly elevated LFTs and with concomitant orthostasis he was referred for admission.  Currently, social work continues to work on placement.  He continues to have significant orthostasis that is limiting his ability to ambulate.     Assessment and Plan: Controlled type 2 diabetes mellitus without complication, without long-term current use of insulin (HCC) Hga1c of 5.9 04/2021 HOME MEDS: Lantus 40 units daily and Metformin XR 500 mg daily Tightly controlled in setting of hypoglycemic events  Stopped metformin 2/2 diarrhea.   2/9 begin low-dose Tradjenta Continue SSI Follow CBGs with the addition of Florinef Of note patient did have an episode of isolated hyperglycemia with a normal anion gap while in the ER but did not have symptoms consistent with DKA  Hepatitis B Overall LFTs trending down Appreciate GI assistance Current suspicion is for chronic Hep B with worsening of LFTs in the setting of ischemic injury during orthostasis vs. Drug induced liver injury (risperdal) He will  eventually need referral to infectious disease for management of chronic hep B Checking LFTs every other day-total bilirubin normal with AST in the 200s and ALTs in the 500  Orthostatic hypotension likely 2/2 autonomic dysfunction- (present on admission) Has had episodic orthostasis while in ED during the past 2 months and received intermittent IV fluids TSH normal, cortisol checked on 04/14/2021 low at 3.1 but cosyntropin stim test on 2/6 baseline cortisol 11 which ruled out adrenal insufficiency -Continue to hold Flomax -continue TED hose -Discussed with psychiatry, it was felt unlikely that antipsychotics were causing orthostasis. -Continue midodrine 5 mg TID-Florinef daily added on 2/9  MDD (major depressive disorder), recurrent, severe, with psychosis ([place])- (present on admission) Appreciate psychiatry assistance Continue lexapro  Risperidone discontinued 2/2 elevated LFTs and Invega initiated by the psychiatric team Continue cogentin Continues to have extremely flat affect and language barrier makes it difficult to effectively allow patient to express feelings and needs.  There may be some cultural issues that are also preventing patient from expressing his emotions   Acute urinary retention- (present on admission) Recurrent incomplete emptying/inability ti void but typucally < 500cc I/O cath prn Flomax dc'd 2/2 orthostasis As of 2/9 patient was able to tell that he needed to void and was able to void successfully as of 2/10 and is continuing to void without difficulty  Anemia Iron panel: Ferritin 696.  B12 in December 2021: 750 and folate 16.5. Hemoglobin stable in the 11 g range.  Outpatient follow-up  Thrombocytopenia (HCC) Mild and intermittent. Follow CBCs periodically. Etiology unclear but remain greater than 100,000. Would avoid Depakote in this patient  Physical deconditioning- (present on admission) PT recommends SNF.  Patient difficult to place.  Elevated  LFTs See Hepatitis B notation      Subjective:  Patient sitting on side of bed continues with bland flat affect.  Used a Optometrist to communicate with the patient again this morning.  He did report dizziness with standing.  He denied being sad when I asked him this question.  When I asked him how he felt about everyone going to [place] and leaving him here and reminded him we were trying to find a safe discharge plan and I asked him what he thought about that he did not reply.  Physical Exam: Vitals:   05/12/21 1035 05/12/21 1956 05/13/21 0403 05/13/21 0823  BP: (!) 89/56 107/73 118/81 107/75  Pulse: 95 85 81 81  Resp:  16 17 17   Temp:  98.8 F (37.1 C) 97.8 F (36.6 C) 98 F (36.7 C)  TempSrc:  Oral Oral Oral  SpO2:  97% 98% 98%   General: Calm, NAD Respiratory: Bilateral lung sounds are clear to auscultation. Respiratory effort normal.  Room air Cardiovascular: S1 & S2, RRR containing sinus.  No peripheral edema and remains normotensive at rest and checked on 2/9 had recurrent standing orthostasis.  Have asked staff to repeat orthostatic vital signs today after first dose of Florinef. Gastrointestinal: Abdomen is nondistended, soft and nontender. Normal bowel sounds heard. Neurological: Cranial nerves II through XII are grossly intact, patient able to move all extremities x4 with strength 3+-4/5.  Sensation is intact Skin: No rashes, lesions or ulcers Psychiatry: Judgement and insight appear impaired. Mood & affect flat.  Data Reviewed:  Orthostatic vital signs reviewed and continues to have significant drops between supine and standing blood pressure reading   DVT Prophylaxis   ., Place ted hose  Place ted hose    Family Communication:  Patient only  Disposition: Remains inpatient appropriate because: Unsafe discharge plan-deconditioned with ambulation concerns primarily related to recurrent orthostasis-also uncontrolled diabetes at presentation; severe depression  along with language barrier contributing to patient's ability for self-care-care be recommends short-term SNF for rehabilitation; also patient does not have a way to pay for SNF or rehab   A physical therapy consult is indicated based on the patients mobility assessment.   Mobility Assessment (last 72 hours)     Mobility Assessment     Row Name 05/13/21 1235 05/12/21 0030 05/11/21 2117   Does patient have an order for bedrest or is patient medically unstable -- No - Continue assessment No - Continue assessment   What is the highest level of mobility based on the progressive mobility assessment? Level 5 (Walks with assist in room/hall) - Balance while stepping forward/back and can walk in room with assist - Complete Level 5 (Walks with assist in room/hall) - Balance while stepping forward/back and can walk in room with assist - Complete Level 5 (Walks with assist in room/hall) - Balance while stepping forward/back and can walk in room with assist - Complete   Is the above level different from baseline mobility prior to current illness? -- Yes - Recommend PT order Yes - Recommend PT order    [place] Name 05/11/21 1119       What is the highest level of mobility based on the progressive mobility assessment? Level 5 (Walks with assist in room/hall) - Balance while stepping forward/back and can walk in room with assist - Complete  Planned Discharge Destination:  Skilled nursing facility   COVID vaccination status:  Unknown  Consultants: Psychiatry Gastroenterology Procedures: Echocardiogram Antibiotics: None    Time spent: 5 minutes  Author: Erin Hearing, NP 05/13/2021 2:08 PM  For on call review [email or web address].

## 2021-05-13 NOTE — Progress Notes (Signed)
Physical Therapy Treatment Patient Details Name: Garrett [name] MRN: [id number] DOB: 1985/05/08 Today's Date: 05/13/2021   History of Present Illness 36 yo male presenting to ED with  withdrawal from friends, lack of intake, not himself with possibly catatonic behavior; thought to be secondary to MDD.   Per nsg pt sustained a fall with unsteady knees early on in his admission. Current issue of hypotension, symptomatic and admitted to floor on 05/05/21 with transaminitis and right sided abdominal pain. PMHx: DM, medication non compliance, AMS, DKA, acute metabolic encephalopathy, MDD    PT Comments    Patient progressing with activity tolerance this session able to ambulate in hallway.  Still orthostatic by measurements with sitting BP 95/69 and 75/54 in standing, but rebound to 81/59 standing 2 minutes and ambulating without complaints.  Patient docile and reliant on assistance for ADL/IADL and remains appropriate for SNF level rehab.  PT to continue to follow acutely.   Recommendations for follow up therapy are one component of a multi-disciplinary discharge planning process, led by the attending physician.  Recommendations may be updated based on patient status, additional functional criteria and insurance authorization.  Follow Up Recommendations  Skilled nursing-short term rehab (<3 hours/day)     Assistance Recommended at Discharge Frequent or constant Supervision/Assistance  Patient can return home with the following A little help with bathing/dressing/bathroom;A little help with walking and/or transfers;Direct supervision/assist for medications management;Assistance with cooking/housework;Help with stairs or ramp for entrance;Assist for transportation   Equipment Recommendations  None recommended by PT    Recommendations for Other Services       Precautions / Restrictions Precautions Precautions: Fall Precaution Comments: Watch BP     Mobility  Bed Mobility                General bed mobility comments: sitting up on EOB upon entry    Transfers Overall transfer level: Needs assistance Equipment used: None Transfers: Sit to/from Stand Sit to Stand: Min guard           General transfer comment: minguard for safety    Ambulation/Gait Ambulation/Gait assistance: Min guard Gait Distance (Feet): 150 Feet Assistive device: None Gait Pattern/deviations: Step-through pattern, Decreased stride length       General Gait Details: slow pace, but steady throughout, minguard for safety still with some orthostatic BP measurements, wearing knee hi TEDs   Stairs             Wheelchair Mobility    Modified Rankin (Stroke Patients Only)       Balance Overall balance assessment: Needs assistance   Sitting balance-Leahy Scale: Good     Standing balance support: No upper extremity supported Standing balance-Leahy Scale: Good Standing balance comment: no UE support in standing or with ambulation                            Cognition Arousal/Alertness: Awake/alert Behavior During Therapy: Flat affect Overall Cognitive Status: Difficult to assess                                          Exercises      General Comments General comments (skin integrity, edema, etc.): BP sitting EOB 95/69, HR 90; standing initially 75/54, HR 100, standing after 2-3 min 81/59, HR 100, pt denies symptoms      Pertinent Vitals/Pain Pain Assessment Pain Assessment:  No/denies pain    Home Living                          Prior Function            PT Goals (current goals can now be found in the care plan section) Progress towards PT goals: Progressing toward goals    Frequency    Min 2X/week      PT Plan Current plan remains appropriate    Co-evaluation              AM-PAC PT "6 Clicks" Mobility   Outcome Measure  Help needed turning from your back to your side while in a flat bed without using  bedrails?: None Help needed moving from lying on your back to sitting on the side of a flat bed without using bedrails?: None Help needed moving to and from a bed to a chair (including a wheelchair)?: A Little Help needed standing up from a chair using your arms (e.g., wheelchair or bedside chair)?: A Little Help needed to walk in hospital room?: A Little Help needed climbing 3-5 steps with a railing? : Total 6 Click Score: 18    End of Session Equipment Utilized During Treatment: Gait belt Activity Tolerance: Patient tolerated treatment well Patient left: in bed;with call bell/phone within reach   PT Visit Diagnosis: Adult, failure to thrive (R62.7);Muscle weakness (generalized) (M62.81);History of falling (Z91.81);Other abnormalities of gait and mobility (R26.89)     Time: 1202-1216 PT Time Calculation (min) (ACUTE ONLY): 14 min  Charges:  $Gait Training: 8-22 mins                     Garrett [name], PT Acute Rehabilitation Services [phone] [phone] 05/13/2021    Garrett [name] 05/13/2021, 12:38 PM

## 2021-05-14 DIAGNOSIS — R7989 Other specified abnormal findings of blood chemistry: Secondary | ICD-10-CM | POA: Diagnosis not present

## 2021-05-14 LAB — COMPREHENSIVE METABOLIC PANEL
ALT: 417 U/L — ABNORMAL HIGH (ref 0–44)
AST: 189 U/L — ABNORMAL HIGH (ref 15–41)
Albumin: 3.2 g/dL — ABNORMAL LOW (ref 3.5–5.0)
Alkaline Phosphatase: 87 U/L (ref 38–126)
Anion gap: 8 (ref 5–15)
BUN: 16 mg/dL (ref 6–20)
CO2: 31 mmol/L (ref 22–32)
Calcium: 8.8 mg/dL — ABNORMAL LOW (ref 8.9–10.3)
Chloride: 98 mmol/L (ref 98–111)
Creatinine, Ser: 0.7 mg/dL (ref 0.61–1.24)
GFR, Estimated: >60 mL/min (ref >60)
Glucose, Bld: 170 mg/dL — ABNORMAL HIGH (ref 70–99)
Potassium: 3.6 mmol/L (ref 3.5–5.1)
Sodium: 137 mmol/L (ref 135–145)
Total Bilirubin: 0.4 mg/dL (ref 0.3–1.2)
Total Protein: 7.7 g/dL (ref 6.5–8.1)

## 2021-05-14 LAB — CBC WITH DIFFERENTIAL/PLATELET
Abs Immature Granulocytes: 0.01 10*3/uL (ref 0.00–0.07)
Basophils Absolute: 0 10*3/uL (ref 0.0–0.1)
Basophils Relative: 1 %
Eosinophils Absolute: 0.3 10*3/uL (ref 0.0–0.5)
Eosinophils Relative: 4 %
HCT: 35.3 % — ABNORMAL LOW (ref 39.0–52.0)
Hemoglobin: 11.5 g/dL — ABNORMAL LOW (ref 13.0–17.0)
Immature Granulocytes: 0 %
Lymphocytes Relative: 40 %
Lymphs Abs: 3 10*3/uL (ref 0.7–4.0)
MCH: 29.7 pg (ref 26.0–34.0)
MCHC: 32.6 g/dL (ref 30.0–36.0)
MCV: 91.2 fL (ref 80.0–100.0)
Monocytes Absolute: 0.8 10*3/uL (ref 0.1–1.0)
Monocytes Relative: 10 %
Neutro Abs: 3.3 10*3/uL (ref 1.7–7.7)
Neutrophils Relative %: 45 %
Platelets: 150 10*3/uL (ref 150–400)
RBC: 3.87 MIL/uL — ABNORMAL LOW (ref 4.22–5.81)
RDW: 12.6 % (ref 11.5–15.5)
WBC: 7.5 10*3/uL (ref 4.0–10.5)
nRBC: 0 % (ref 0.0–0.2)

## 2021-05-14 LAB — GLUCOSE, CAPILLARY
Glucose-Capillary: 152 mg/dL — ABNORMAL HIGH (ref 70–99)
Glucose-Capillary: 180 mg/dL — ABNORMAL HIGH (ref 70–99)
Glucose-Capillary: 158 mg/dL — ABNORMAL HIGH (ref 70–99)
Glucose-Capillary: 87 mg/dL (ref 70–99)

## 2021-05-14 LAB — HEMOGLOBIN A1C
Hgb A1c MFr Bld: 5.5 % (ref 4.8–5.6)
Mean Plasma Glucose: 111.15 mg/dL

## 2021-05-14 NOTE — Progress Notes (Addendum)
Progress Note  Chart briefly reviewed.  Interviewed and examined patient using audio interpreter (video interpreter not available today).  Subjective:  Patient denies difficulty urinating.  Unable to tell when he last had a BM despite repeated questioning.  Did report some dizziness on standing.  No other complaints reported.  Objective:  Stable vital signs.  No orthostatic vital signs seen for today. RS: Clear to auscultation.  No increased work of breathing.   CVS: S1 and S2 heard, RRR.  No JVD, murmurs or pedal edema. Abdominal exam: Nondistended, soft and nontender.  No bladder obviously palpable.  Normal bowel sounds heard. CNS: Appears alert and oriented. Psychiatry: Flat affect and answers with a simple 1 or 2 word answers to the interpreter.  Suspect that in addition to his behavioral health issues, cultural and language barriers are contributing to this.   Labs:  BMP unremarkable except for glucose 170.  CBGs with mildly fluctuating and uncontrolled DM but reasonable. LFTs significant for AST 189, ALT 417, decreasing compared to 2/8. CBC with hemoglobin 11.6, stable compared to prior.  Thrombocytopenia has resolved.  Assessment and plan:  1.  Type II DM, likely complicated by neurogenic bladder and?  Autonomic neuropathy: Reportedly was on high-dose Lantus and metformin PTA.  Unsure regarding compliance with either 1 of these.  Metformin discontinued due to diarrhea.  Currently on Tradjenta.  CBGs okay. 2.  Abnormal LFTs/hepatitis B: Numbers continue to trend down.  Eventual outpatient referral to ID for management of chronic hepatitis B. 3.  Orthostatic hypotension: Clinically euvolemic.  Continue TED hose, midodrine and Florinef.  Still somewhat symptomatic.  Could consider increasing midodrine to 10 mg 3 times daily. 4.  Acute urinary retention/?  Neurogenic bladder: Patient reports voiding without difficulty.  Requested RN to [facility] and also verify last BM because  constipation could contribute to this.  Appears that he had a BM today. 5.  Rest as per progress note by Ms. Junious Silk, NP on 05/13/2021. 6.  Difficult disposition.   Marcellus Scott, MD,  FACP, [facility], [facility], [facility] (Care Management Physician Certified) [facility] [place]  To contact the attending provider between 7A-7P or the covering provider during after hours 7P-7A, please log into the web site [email or web address] and access using universal [place] password for that web site. If you do not have the password, please call the hospital operator.

## 2021-05-15 DIAGNOSIS — R7989 Other specified abnormal findings of blood chemistry: Secondary | ICD-10-CM | POA: Diagnosis not present

## 2021-05-15 LAB — GLUCOSE, CAPILLARY
Glucose-Capillary: 185 mg/dL — ABNORMAL HIGH (ref 70–99)
Glucose-Capillary: 137 mg/dL — ABNORMAL HIGH (ref 70–99)
Glucose-Capillary: 206 mg/dL — ABNORMAL HIGH (ref 70–99)
Glucose-Capillary: 123 mg/dL — ABNORMAL HIGH (ref 70–99)

## 2021-05-15 MED ORDER — MIDODRINE HCL 5 MG PO TABS
10.0000 mg | ORAL_TABLET | Freq: Three times a day (TID) | ORAL | Status: DC
  Administered 2021-05-15 – 2021-07-12 (×173): 10 mg via ORAL
  Filled 2021-05-15 (×164): qty 2

## 2021-05-15 MED ORDER — SIMETHICONE 80 MG PO CHEW: 80.0000 mg | CHEWABLE_TABLET | Freq: Four times a day (QID) | ORAL | Status: DC | PRN

## 2021-05-15 NOTE — Progress Notes (Addendum)
[facility] Progress Note  CC:  Elevated LFTs, chronic hepatitis B  Subjective: He has skin irritation to his left mid abdomen, small patch or redness. He is tolerating solid food. He reported passing a yellow stool today. No rectal bleeding or black stools. No CP or SOB. Stratus phone Julieta Bellini utilized to communicate with patient.   Objective:  Vital signs in last 24 hours: Temp:  [97.7 F (36.5 C)-98.6 F (37 C)] 97.9 F (36.6 C) (02/12 0805) Pulse Rate:  [76-94] 80 (02/12 0805) Resp:  [16] 16 (02/12 0805) BP: (96-128)/(70-90) 116/84 (02/12 0805) SpO2:  [96 %-100 %] 98 % (02/12 0805) Last BM Date: 05/14/21 General: Alert in NAD. Heart: RRR, no murmur  Pulm: Clear throughout.  Abdomen: Soft, nontender.No Extremities:  Without edema. Neurologic:  Alert and  oriented x 4. Grossly normal neurologically. Psych:  Alert and cooperative. Normal mood and affect.  Intake/Output from previous day: 02/11 0701 - 02/12 0700 In: 720 [P.O.:720] Out: 752 [Urine:752] Intake/Output this shift: Total I/O In: 220 [P.O.:220] Out: -   Lab Results: Recent Labs    05/14/21 0244  WBC 7.5  HGB 11.5*  HCT 35.3*  PLT 150   BMET Recent Labs    05/14/21 0244  NA 137  K 3.6  CL 98  CO2 31  GLUCOSE 170*  BUN 16  CREATININE 0.70  CALCIUM 8.8*   LFT Recent Labs    05/14/21 0244  PROT 7.7  ALBUMIN 3.2*  AST 189*  ALT 417*  ALKPHOS 87  BILITOT 0.4   PT/INR No results for input(s): LABPROT, INR in the last 72 hours. Hepatitis Panel No results for input(s): HEPBSAG, HCVAB, HEPAIGM, HEPBIGM in the last 72 hours.  No results found.  Assessment / Plan:  36 yo male, from [place], [place] speaking with PMH of severe depression with psychosis, DM2 admitted 05/05/21 with abdominal pain and elevated liver chemistries. Apparently in holding in ED for two months (involuntary) for due to hypoglycemic episodes, orthostatic HTN. No family or anyone in  community for care for him. While being housed in ED he developed acute on chronic elevation in liver enzymes.  1) Chronic hepatitis B with acute on chronic elevated LFT. Liver enzymes continue to trend downward. HIV negative.  ANA positive.  Ceruloplasmin 25.7.  Hepatitis B surface antigen reactive.  Hepatitis B core IgM nonreactive.  Hepatitis B core total antibody. Hep B DNA 6,000,690. Hepatitis A IgM nonreactive.  Hepatitis C antibody nonreactive.  AMA 34.  IgG 3,070.  SMA 19.  Normal INR.  Alk phos 87.  Total bili 0.4.  AST 189.  ALT 417.  RUQ sonogram 05/05/2021 showed a normal liver without evidence of hepatoma. -Hep Be antigen, Hep Be antigen, AFP, hepatic panel in a.m. -Elevated AMA level suggest possible PBC, however alk phos level is normal -Consider eventual liver biopsy and evaluation at [facility]  -Chronic hepatitis B treatment deferred for now -Await further recommendations per Dr. Candis Schatz       Active Problems:   MDD (major depressive disorder), recurrent, severe, with psychosis ([place])   Controlled type 2 diabetes mellitus without complication, without long-term current use of insulin (HCC)   Orthostatic hypotension likely 2/2 autonomic dysfunction   Anemia   Physical deconditioning   Thrombocytopenia ([place])   Acute urinary retention   Hepatitis B     LOS: 10 days   Noralyn Pick  05/15/2021, 2:48 PM  I have taken a history, reviewed the chart  and examined the patient. I performed a substantive portion of this encounter, including complete performance of at least one of the key components, in conjunction with the APP. I agree with the APP's note, impression and recommendations  Patient's elevated liver enzymes most likely related to chronic Hep B infection with possible superimposed DILI vs hypotensive liver insult.   His liver enzymes have downtrended slowly, but steadily.  I think that the patient probably warrants Hep B treatment, but this  decision needs to be made with long term plans, as initiation of therapy with subsequent disruption/interruption is likely more detrimental than delaying initiation of therapy.  When his disposition becomes more clear, I would engage with ID or a hepatologist to initiate therapy.  For now, I would continue to periodically check his liver enzymes, perhaps once a week, to ensure they continue to downtrend. Will get HBeAg/Ab to help guide treatment plan  GI will sign off for now, please reconsult if there are changes in the patient's status or disposition   Beronica Lansdale E. Candis Schatz, MD [facility]

## 2021-05-15 NOTE — Plan of Care (Signed)
°  Problem: Nutrition: Goal: Adequate nutrition will be maintained Outcome: Progressing   Problem: Coping: Goal: Level of anxiety will decrease Outcome: Progressing   Problem: Pain Managment: Goal: General experience of comfort will improve Outcome: Progressing   Problem: Elimination: Goal: Will not experience complications related to bowel motility Outcome: Progressing Goal: Will not experience complications related to urinary retention Outcome: Progressing   Problem: Safety: Goal: Ability to remain free from injury will improve Outcome: Progressing   Problem: Skin Integrity: Goal: Risk for impaired skin integrity will decrease Outcome: Progressing

## 2021-05-15 NOTE — Progress Notes (Addendum)
Progress Note  Chart briefly reviewed.  There was no video interpreter available.  Attempted to use the audio interpreter and despite several attempts, unsuccessful.  Subjective:  Patient was able to communicate that he had "pooped 3 times".  However as per nursing, patient is a total assist, she was not sure if this history is accurate because he usually soils himself.  Patient also stated "I want snack".  Objective: Young male, small built and thinly nourished lying comfortably supine in bed without distress.  Oral mucosa moist. Stable vital signs.  No orthostatic vital signs seen for today either, will request RN. RS: Clear to auscultation.  No increased work of breathing.   CVS: S1 and S2 heard, RRR.  No JVD, murmurs or pedal edema. Abdominal exam: Nondistended, soft and nontender.  No bladder obviously palpable.  Normal bowel sounds heard. CNS: Appears alert and oriented. Psychiatry: Flat affect.  Suspect that in addition to his behavioral health issues, cultural and language barriers are contributing to this.   Labs: No new labs for today.  Below are labs from 2/11. BMP unremarkable except for glucose 170.  CBGs with mildly fluctuating and uncontrolled DM but reasonable. LFTs significant for AST 189, ALT 417, decreasing compared to 2/8. CBC with hemoglobin 11.6, stable compared to prior.  Thrombocytopenia has resolved.  Assessment and plan:  1.  Type II DM, likely complicated by neurogenic bladder and?  Autonomic neuropathy: Reportedly was on high-dose Lantus and metformin PTA.  Unsure regarding compliance with either 1 of these.  Metformin discontinued due to diarrhea.  Currently on Tradjenta.  CBGs okay. 2.  Abnormal LFTs/hepatitis B: Numbers continue to trend down.  Eventual outpatient referral to ID for management of chronic hepatitis B. 3.  Orthostatic hypotension: Clinically euvolemic.  Continue TED hose, midodrine and Florinef.  Still somewhat symptomatic.  Still  orthostatic (supine 123/89, sitting 115/85 and standing 92/66).  Increased midodrine to 10 mg 3 times daily. 4.  Acute urinary retention/?  Neurogenic bladder: Patient reports voiding without difficulty.  Bladder scan 187 mL >156 mL 1 BM was documented yesterday and patient reports she had 3 BMs today although this is not verified. 5.  Rest as per progress note by Ms. Junious Silk, NP on 05/13/2021.  Ms. Rennis Harding will assume care from 2/13. 6.  Difficult disposition.  Discussed with patient's RN outside his room.   Marcellus Scott, MD,  FACP, [facility], [facility], [facility] (Care Management Physician Certified) [facility] [place]  To contact the attending provider between 7A-7P or the covering provider during after hours 7P-7A, please log into the web site [email or web address] and access using universal [place] password for that web site. If you do not have the password, please call the hospital operator.

## 2021-05-16 DIAGNOSIS — N3289 Other specified disorders of bladder: Secondary | ICD-10-CM | POA: Diagnosis not present

## 2021-05-16 DIAGNOSIS — I951 Orthostatic hypotension: Secondary | ICD-10-CM | POA: Diagnosis not present

## 2021-05-16 DIAGNOSIS — R7989 Other specified abnormal findings of blood chemistry: Secondary | ICD-10-CM | POA: Diagnosis not present

## 2021-05-16 LAB — HEPATIC FUNCTION PANEL
ALT: 340 U/L — ABNORMAL HIGH (ref 0–44)
AST: 155 U/L — ABNORMAL HIGH (ref 15–41)
Albumin: 3 g/dL — ABNORMAL LOW (ref 3.5–5.0)
Alkaline Phosphatase: 83 U/L (ref 38–126)
Bilirubin, Direct: 0.2 mg/dL (ref 0.0–0.2)
Indirect Bilirubin: 0.2 mg/dL — ABNORMAL LOW (ref 0.3–0.9)
Total Bilirubin: 0.4 mg/dL (ref 0.3–1.2)
Total Protein: 7.4 g/dL (ref 6.5–8.1)

## 2021-05-16 LAB — GLUCOSE, CAPILLARY
Glucose-Capillary: 239 mg/dL — ABNORMAL HIGH (ref 70–99)
Glucose-Capillary: 230 mg/dL — ABNORMAL HIGH (ref 70–99)
Glucose-Capillary: 232 mg/dL — ABNORMAL HIGH (ref 70–99)
Glucose-Capillary: 210 mg/dL — ABNORMAL HIGH (ref 70–99)

## 2021-05-16 LAB — BASIC METABOLIC PANEL
Anion gap: 5 (ref 5–15)
BUN: 15 mg/dL (ref 6–20)
CO2: 31 mmol/L (ref 22–32)
Calcium: 8.8 mg/dL — ABNORMAL LOW (ref 8.9–10.3)
Chloride: 101 mmol/L (ref 98–111)
Creatinine, Ser: 0.87 mg/dL (ref 0.61–1.24)
GFR, Estimated: >60 mL/min (ref >60)
Glucose, Bld: 259 mg/dL — ABNORMAL HIGH (ref 70–99)
Potassium: 3.9 mmol/L (ref 3.5–5.1)
Sodium: 137 mmol/L (ref 135–145)

## 2021-05-16 MED ORDER — POLYETHYLENE GLYCOL 3350 17 G PO PACK
17.0000 g | PACK | Freq: Every day | ORAL | Status: DC
  Administered 2021-05-18 – 2021-06-28 (×33): 17 g via ORAL
  Filled 2021-05-16 (×41): qty 1

## 2021-05-16 MED ORDER — SENNOSIDES-DOCUSATE SODIUM 8.6-50 MG PO TABS
1.0000 | ORAL_TABLET | Freq: Every day | ORAL | Status: DC
  Administered 2021-05-18 – 2021-06-28 (×42): 1 via ORAL
  Filled 2021-05-16 (×42): qty 1

## 2021-05-16 NOTE — Progress Notes (Signed)
Mobility Specialist Progress Note:   05/16/21 1313  Mobility  Activity Ambulated with assistance in hallway  Level of Assistance Standby assist, set-up cues, supervision of patient - no hands on  Assistive Device None  Distance Ambulated (ft) 300 ft  Activity Response Tolerated well  $Mobility charge 1 Mobility   Pt received in bed willing to participate in mobility. No complaints of pain and asymptomatic. Pt left in bed with call bell in reach and all needs met.   [facility] Public librarian Phone [phone]

## 2021-05-16 NOTE — Plan of Care (Signed)
°  Problem: Health Behavior/Discharge Planning: Goal: Ability to manage health-related needs will improve Outcome: Progressing   Problem: Clinical Measurements: Goal: Ability to maintain clinical measurements within normal limits will improve Outcome: Progressing   Problem: Nutrition: Goal: Adequate nutrition will be maintained Outcome: Progressing   Problem: Coping: Goal: Level of anxiety will decrease Outcome: Progressing   Problem: Elimination: Goal: Will not experience complications related to bowel motility Outcome: Progressing Goal: Will not experience complications related to urinary retention Outcome: Progressing   Problem: Pain Managment: Goal: General experience of comfort will improve Outcome: Progressing   Problem: Safety: Goal: Ability to remain free from injury will improve Outcome: Progressing

## 2021-05-16 NOTE — Progress Notes (Signed)
Progress Note  Chart briefly reviewed.  Interviewed and examined patient utilizing video interpreter at bedside.  Discussed with patient's RN as well.  Subjective:  Patient reports that he had 3 diarrheal stools last night and once this morning.  Reports watery stools.  No abdominal pain.  Also states that he has mild dizziness in upright position but significantly better than prior.  Objective: Young male, small built and thinly nourished initially seen ambulating comfortably in the bathroom and then back from bathroom to his bed.  Appears that he had just taken a shower.  Did not appear unsteady. Stable vital signs.  He was orthostatic yesterday but no orthostatic vital signs taken today. RS: Clear to auscultation.  No increased work of breathing.   CVS: S1 and S2 heard, RRR.  No JVD, murmurs or pedal edema. Abdominal exam: Abdomen is nondistended, soft and nontender.  Normal bowel sounds heard. CNS: Appears alert and oriented. Psychiatry: Flat affect.  Suspect that in addition to his behavioral health issues, cultural and language barriers are contributing to this.   Labs: No new labs for today.  Below are labs from 2/12 AST 155, ALT 340, total bilirubin 0.4  Assessment and plan:  Diarrhea: May be related to laxatives.  Held MiraLAX and senna for today.  Reassess tomorrow and may need to cut back on dose.  If persists or worsens, may need to consider further evaluation.  Will add and review BMP to this morning's labs. Type II DM, likely complicated by neurogenic bladder and?  Autonomic neuropathy: Reportedly was on high-dose Lantus and metformin PTA.  Unsure regarding compliance with either 1 of these.  Metformin discontinued due to diarrhea.  Currently on Tradjenta.  CBGs okay. Abnormal LFTs/hepatitis B: Numbers continue to trend down.  AST down to 155, ALT down to 340.  Eventual outpatient referral to ID for management of chronic hepatitis B.  [facility] follow-up from 2/12  appreciated: Suspect his elevated LFTs most likely related to chronic hepatitis B infection with possible superimposed Dili versus hypertensive liver insult.  Recommend engaging ID or hepatology to initiate therapy when his disposition becomes more clear.  Periodically follow LFTs, maybe once a week.  Follow HBV E antigen and E antibody-pending.  Orthostatic hypotension: Clinically euvolemic.  Continue TED hose, midodrine and Florinef.  Still somewhat symptomatic.  Still orthostatic on 2/12 (supine 123/89, sitting 115/85 and standing 92/66).  Increased midodrine to 10 mg 3 times daily. Follow.  Seems minimally symptomatic and able to ambulate steadily. Acute urinary retention/?  Neurogenic bladder: Patient reports voiding without difficulty.  Bladder scan on 2/12, 187 mL >156 mL. Rest as per progress note by Ms. Erin Hearing, NP on 05/13/2021.  Ms. Lissa Merlin will assume care from 2/14. Difficult disposition.  Discussed with patient's RN outside his room.   Vernell Leep, MD,  FACP, [facility], [facility], [facility] (Care Management Physician Certified) [facility] [place]  To contact the attending provider between 7A-7P or the covering provider during after hours 7P-7A, please log into the web site [email or web address] and access using universal [place] password for that web site. If you do not have the password, please call the hospital operator.

## 2021-05-16 NOTE — Plan of Care (Signed)

## 2021-05-17 DIAGNOSIS — E119 Type 2 diabetes mellitus without complications: Secondary | ICD-10-CM | POA: Diagnosis not present

## 2021-05-17 DIAGNOSIS — F333 Major depressive disorder, recurrent, severe with psychotic symptoms: Secondary | ICD-10-CM | POA: Diagnosis not present

## 2021-05-17 DIAGNOSIS — R5381 Other malaise: Secondary | ICD-10-CM | POA: Diagnosis not present

## 2021-05-17 LAB — GLUCOSE, CAPILLARY
Glucose-Capillary: 164 mg/dL — ABNORMAL HIGH (ref 70–99)
Glucose-Capillary: 230 mg/dL — ABNORMAL HIGH (ref 70–99)
Glucose-Capillary: 332 mg/dL — ABNORMAL HIGH (ref 70–99)
Glucose-Capillary: 258 mg/dL — ABNORMAL HIGH (ref 70–99)

## 2021-05-17 LAB — HEPATITIS B E ANTIBODY: Hep B E Ab: NEGATIVE

## 2021-05-17 LAB — HEPATITIS B E ANTIGEN: Hep B E Ag: POSITIVE — AB

## 2021-05-17 LAB — AFP TUMOR MARKER: AFP, Serum, Tumor Marker: 10 ng/mL — ABNORMAL HIGH (ref 0.0–6.9)

## 2021-05-17 NOTE — Progress Notes (Signed)
Mobility Specialist Progress Note:   05/17/21 1203  Mobility  Activity Ambulated with assistance in hallway  Level of Assistance Standby assist, set-up cues, supervision of patient - no hands on  Assistive Device None  Distance Ambulated (ft) 300 ft  Activity Response Tolerated well  $Mobility charge 1 Mobility   Pt received in bed willing to participate in mobility. No complaints of pain. Left in bed with call bell in reach and all needs met.   [facility] Public librarian Phone [phone]

## 2021-05-17 NOTE — Progress Notes (Signed)
Physical Therapy Treatment Patient Details Name: Garrett [name] MRN: [id number] DOB: 01/17/1986 Today's Date: 05/17/2021   History of Present Illness Pt is a 36 y.o. male presenting to ED 03/04/21 with withdrawal from friends, lack of oral intake, possibly catatonic behavior; thought to be secondary to MDD. Per nsg pt sustained a fall with unsteady knees early on in his admission. Current issue of hypotension, symptomatic and admitted to floor on 05/05/21 with transaminitis and right sided abdominal pain. PMH includes DM, medication non-compliance, AMS, DKA, acute metabolic encephalopathy, MDD.   PT Comments    Pt progressing well with mobility. Pt requires encouragement to participate, but ultimately ambulating independently; endorses slight dizziness when asked, post-ambulation BP 133/88. No longer feel pt requires SNF-level therapies at d/c. Will continue to follow acutely to address established goals.    Recommendations for follow up therapy are one component of a multi-disciplinary discharge planning process, led by the attending physician.  Recommendations may be updated based on patient status, additional functional criteria and insurance authorization.  Follow Up Recommendations  No PT follow up     Assistance Recommended at Discharge PRN  Patient can return home with the following Assist for transportation;Assistance with cooking/housework;Direct supervision/assist for financial management;Direct supervision/assist for medications management   Equipment Recommendations  None recommended by PT    Recommendations for Other Services       Precautions / Restrictions Precautions Precautions: Other (comment) Precaution Comments: not orthostatic during PT session 2/14 Restrictions Weight Bearing Restrictions: No     Mobility  Bed Mobility Overal bed mobility: Independent                  Transfers Overall transfer level: Independent Equipment used: None                     Ambulation/Gait Ambulation/Gait assistance: Independent Gait Distance (Feet): 400 Feet Assistive device: None Gait Pattern/deviations: Step-through pattern, Decreased stride length Gait velocity: Decreased     General Gait Details: Slow, steady gait indep without DME; pt endorses slight dizziness at end of ambulation, wearing ted hose, BP 133/88   Stairs             Wheelchair Mobility    Modified Rankin (Stroke Patients Only)       Balance Overall balance assessment: Modified Independent Sitting-balance support: No upper extremity supported, Feet supported Sitting balance-Leahy Scale: Normal     Standing balance support: No upper extremity supported Standing balance-Leahy Scale: Good                              Cognition Arousal/Alertness: Awake/alert Behavior During Therapy: Flat affect Overall Cognitive Status: Difficult to assess                                 General Comments: Unable to obtain [name] interpreter this session. Pt able to communicate in basic English and make needs known; minimally conversant with flat affect        Exercises Other Exercises Other Exercises: pt declines additional therex due to fatigue; required encouragement to walk, reports, "I already walked today" (referring to working with mobility specialist)    General Comments General comments (skin integrity, edema, etc.): difficult to engage pt in discussion regarding d/c plan, flat affect, but also suspect more limited by lack of interpreter available      Pertinent Vitals/Pain Pain Assessment  Pain Assessment: Faces Faces Pain Scale: Hurts a little bit Pain Location: low back Pain Descriptors / Indicators: Discomfort Pain Intervention(s): Monitored during session    Home Living                          Prior Function            PT Goals (current goals can now be found in the care plan section) Progress towards PT goals:  Progressing toward goals    Frequency    Min 1X/week      PT Plan Discharge plan needs to be updated;Frequency needs to be updated    Co-evaluation              AM-PAC PT "6 Clicks" Mobility   Outcome Measure  Help needed turning from your back to your side while in a flat bed without using bedrails?: None Help needed moving from lying on your back to sitting on the side of a flat bed without using bedrails?: None Help needed moving to and from a bed to a chair (including a wheelchair)?: None Help needed standing up from a chair using your arms (e.g., wheelchair or bedside chair)?: None Help needed to walk in hospital room?: None Help needed climbing 3-5 steps with a railing? : A Little 6 Click Score: 23    End of Session   Activity Tolerance: Patient tolerated treatment well Patient left: in bed;with call bell/phone within reach Nurse Communication: Mobility status PT Visit Diagnosis: Adult, failure to thrive (R62.7);Muscle weakness (generalized) (M62.81);History of falling (Z91.81);Other abnormalities of gait and mobility (R26.89)     Time: 5053-9767 PT Time Calculation (min) (ACUTE ONLY): 11 min  Charges:  $Therapeutic Exercise: 8-22 mins                     Ina Homes, PT, DPT Acute Rehabilitation Services  Pager [phone] Office [phone]  Malachy Chamber 05/17/2021, 5:16 PM

## 2021-05-17 NOTE — Progress Notes (Addendum)
Occupational Therapy Treatment Patient Details Name: Garrett [name] MRN: [id number] DOB: 12-24-1985 Today's Date: 05/17/2021   History of present illness 36 yo male presenting to ED with  withdrawal from friends, lack of intake, not himself with possibly catatonic behavior; thought to be secondary to MDD.   Per nsg pt sustained a fall with unsteady knees early on in his admission. Current issue of hypotension, symptomatic and admitted to floor on 05/05/21 with transaminitis and right sided abdominal pain. PMHx: DM, medication non compliance, AMS, DKA, acute metabolic encephalopathy, MDD   OT comments  Pt seen today for increased strengthening and ROM, reviewed UB/LB exercises with pt using green theraband. Theraband not left in room per RN request/pt precautions. Pt able to complete with demonstration while sitting EOB. Pt supervision level for bed mobility, declines transferring/OOB mobility at this time, states he "already walked" with mobility tech earlier in the day. Pt presenting with impairments listed below, will follow. Continue to recommend SNF at d/c.   Recommendations for follow up therapy are one component of a multi-disciplinary discharge planning process, led by the attending physician.  Recommendations may be updated based on patient status, additional functional criteria and insurance authorization.    Follow Up Recommendations  Skilled nursing-short term rehab (<3 hours/day)    Assistance Recommended at Discharge Frequent or constant Supervision/Assistance  Patient can return home with the following  A little help with walking and/or transfers;A little help with bathing/dressing/bathroom;Direct supervision/assist for medications management;Assist for transportation;Direct supervision/assist for financial management;Assistance with cooking/housework   Equipment Recommendations  Other (comment)    Recommendations for Other Services      Precautions / Restrictions  Precautions Precautions: Fall Precaution Comments: Watch BP Restrictions Weight Bearing Restrictions: No       Mobility Bed Mobility Overal bed mobility: Needs Assistance Bed Mobility: Sit to Supine       Sit to supine: Supervision   General bed mobility comments: sitting up on EOB with legs crossed upon arrival    Transfers                         Balance Overall balance assessment: Needs assistance Sitting-balance support: Feet supported Sitting balance-Leahy Scale: Good                                     ADL either performed or assessed with clinical judgement   ADL                                              Extremity/Trunk Assessment Upper Extremity Assessment Upper Extremity Assessment: Generalized weakness   Lower Extremity Assessment Lower Extremity Assessment: Defer to PT evaluation        Vision   Vision Assessment?: No apparent visual deficits   Perception Perception Perception: Not tested   Praxis Praxis Praxis: Not tested    Cognition Arousal/Alertness: Awake/alert Behavior During Therapy: Flat affect Overall Cognitive Status: Difficult to assess                                 General Comments: able to respond to questions asked appropriately in regard to pain        Exercises Exercises: General Upper Extremity, General  Lower Extremity General Exercises - Upper Extremity Shoulder Flexion: AROM, Both, 10 reps, Theraband Theraband Level (Shoulder Flexion): Level 3 (Green) Shoulder Extension: AROM, Both, 10 reps, Seated, Theraband Theraband Level (Shoulder Extension): Level 3 (Green) Elbow Flexion: AROM, Both, Seated, Theraband Theraband Level (Elbow Flexion): Level 3 (Green) Elbow Extension: AROM, Both, 10 reps, Seated, Theraband Theraband Level (Elbow Extension): Level 3 (Green) General Exercises - Lower Extremity Ankle Circles/Pumps: AROM, Both, 10 reps Long Arc Quad:  AROM, Both, 10 reps, Seated Hip Flexion/Marching: AROM, Both, 10 reps, Seated    Shoulder Instructions       General Comments VSS, pt able to sit EOB for ~10 min for seated UB/LB therex    Pertinent Vitals/ Pain       Pain Assessment Pain Assessment: Faces Pain Score: 2  Faces Pain Scale: Hurts a little bit Pain Location: low back Pain Descriptors / Indicators: Discomfort Pain Intervention(s): Limited activity within patient's tolerance, Monitored during session  Home Living                                          Prior Functioning/Environment              Frequency  Min 2X/week        Progress Toward Goals  OT Goals(current goals can now be found in the care plan section)  Progress towards OT goals: Progressing toward goals  Acute Rehab OT Goals Patient Stated Goal: none stated OT Goal Formulation: Patient unable to participate in goal setting Time For Goal Achievement: 05/23/21 Potential to Achieve Goals: Fair ADL Goals Pt Will Perform Grooming: Independently;standing Pt Will Perform Lower Body Bathing: Independently;sit to/from stand Pt Will Perform Lower Body Dressing: Independently;sit to/from stand Pt Will Transfer to Toilet: Independently;ambulating Pt/caregiver will Perform Home Exercise Program: Increased ROM;Increased strength;Both right and left upper extremity;With theraband;With Supervision;With written HEP provided Additional ADL Goal #1: Patient will complete 3 step trail making task in preparation for functional IADL/ADL activity.  Plan Discharge plan remains appropriate    Co-evaluation                 AM-PAC OT "6 Clicks" Daily Activity     Outcome Measure   Help from another person eating meals?: None Help from another person taking care of personal grooming?: A Little Help from another person toileting, which includes using toliet, bedpan, or urinal?: A Little Help from another person bathing (including  washing, rinsing, drying)?: A Little Help from another person to put on and taking off regular upper body clothing?: A Little Help from another person to put on and taking off regular lower body clothing?: A Little 6 Click Score: 19    End of Session    OT Visit Diagnosis: Unsteadiness on feet (R26.81);Other abnormalities of gait and mobility (R26.89);Muscle weakness (generalized) (M62.81);Dizziness and giddiness (R42)   Activity Tolerance Patient tolerated treatment well   Patient Left in bed;with call bell/phone within reach   Nurse Communication Mobility status;Other (comment) (Theraband not left in room with pt per RN)        Time: 1350-1401 OT Time Calculation (min): 11 min  Charges: OT General Charges $OT Visit: 1 Visit OT Treatments $Therapeutic Activity: 8-22 mins  Alfonzo Beers, OTD, OTR/L Acute Rehab [phone]) [phone]   Mayer Masker 05/17/2021, 2:10 PM

## 2021-05-17 NOTE — Progress Notes (Signed)
Progress Note   Patient: Garrett [name] [id number] DOB: 1986/02/01 DOA: 03/04/2021     12 DOS: the patient was seen and examined on 05/17/2021   Brief hospital course: Shivansh Catlow is a 36 y.o. male with medical history significant of severe depression with psychosis, T2DM and orthostatic hypotension who had been holding in ED for about 2 months time waiting on placement. He was being cared for by members in the community, but they have returned home and he no longer has care. He has been boarding in ED with some hypoglycemic episodes, diarrhea (resolved after metformin adjusted) and some orthostatic hypotension that continues to persist despite stopping his antihypertensive medication as well as addition of SSRI/mood stabilizing drugs.  While in the ER he also had an isolated episode of hyperglycemia with serum glucose 503 with a normal anion gap.  Not consistent with DKA.  Labs were checked and he was noted to have significantly elevated LFTs and with concomitant orthostasis he was referred for admission.  Currently, social work continues to work on placement.  He continues to have significant orthostasis that is limiting his ability to ambulate.     Assessment and Plan: Controlled type 2 diabetes mellitus without complication, without long-term current use of insulin (HCC) Hga1c of 5.9 04/2021 HOME MEDS: Lantus 40 units daily and Metformin XR 500 mg daily Tightly controlled in setting of hypoglycemic events  Stopped metformin 2/2 diarrhea.   2/9 began low-dose Tradjenta Continue SSI Of note patient did have an episode of isolated hyperglycemia with a normal anion gap while in the ER but did not have symptoms consistent with DKA  Chronic Hepatitis B Overall LFTs trending down Appreciate GI assistance Current suspicion is for chronic Hep B with worsening of LFTs in the setting of ischemic injury during orthostasis vs. Drug induced liver injury (risperdal) He will eventually need referral to  infectious disease for management of chronic hep B Checking LFTs every other day-total bilirubin normal with AST in the 200s and ALTs in the 500 AFP tumor marker slightly elevated at 10 and likely more reflective of acute exacerbation and hepatitis process since CT abdomen without any solid liver masses GI recommends outpatient follow-up at [facility] clinic versus with ID here in [place].  A liver biopsy should be considered. -Although may be a candidate for hepatitis B treatment would need to be initiated in the outpatient setting with subsequent insurance authorization to avoid interruption in treatment.  Also has severe depression which may influence decision to initiate hepatitis B medical therapy.   Orthostatic hypotension likely 2/2 autonomic dysfunction- (present on admission) Has had episodic orthostasis while in ED during the past 2 months and received intermittent IV fluids TSH normal, cortisol checked on 04/14/2021 low at 3.1 but cosyntropin stim test on 2/6 baseline cortisol 11 which ruled out adrenal insufficiency -Continue to hold Flomax -continue TED hose -Discussed with psychiatry, it was felt unlikely that antipsychotics were causing orthostasis. -Due to supine hypertension Florinef discontinued -Midodrine dose increased on 2/14  MDD (major depressive disorder), recurrent, severe, with psychosis ([place])- (present on admission) Appreciate psychiatry assistance Continue lexapro  Risperidone discontinued 2/2 elevated LFTs and Invega initiated by the psychiatric team Continue cogentin Continues to have extremely flat affect and language barrier makes it difficult to effectively allow patient to express feelings and needs.  There may be some cultural issues that are also preventing patient from expressing his emotions   Acute urinary retention- (present on admission) Recurrent incomplete emptying/inability ti void but typucally <  20cc I/O cath prn Flomax dc'd 2/2  orthostasis As of 2/9 patient was able to tell that he needed to void and was able to void successfully as of 2/10 and is continuing to void without difficulty  Anemia Iron panel: Ferritin 696.  B12 in December 2021: 750 and folate 16.5. Hemoglobin stable in the 11 g range.  Outpatient follow-up  Thrombocytopenia (HCC) Mild and intermittent. Follow CBCs periodically. Etiology unclear but remain greater than 100,000. Would avoid Depakote in this patient  Physical deconditioning- (present on admission) PT recommends SNF.  Patient difficult to place.  Elevated LFTs See Hepatitis B notation      Subjective:  Awake.  Denies dizziness  Physical Exam: Vitals:   05/16/21 2009 05/16/21 2328 05/17/21 0444 05/17/21 0809  BP: (!) 173/110 (!) 135/98 (!) 146/106 107/77  Pulse: 85 83 81 84  Resp: 17 18 17 19   Temp: 98.1 F (36.7 C) 98.2 F (36.8 C) 98.3 F (36.8 C) (!) 97.4 F (36.3 C)  TempSrc: Oral Oral Oral Oral  SpO2: 99% 100% 99% 97%   General: Calm, NAD Respiratory: Bilateral lung sounds are clear to auscultation. Respiratory effort normal.  Room air Cardiovascular: S1 & S2, regular pulse.  Normotensive when sitting. Gastrointestinal: Abdomen is nondistended, soft and nontender. Normal bowel sounds heard. Neurological: Cranial nerves II through XII are grossly intact, patient able to move all extremities x4 with strength 3+-4/5.  Sensation is intact Skin: No rashes, lesions or ulcers.  Psychiatry: Judgement and insight intact.  Mood & affect flat.  Data Reviewed: Orthostatic vital signs reviewed and continues to have significant drops between supine and standing blood pressure reading   DVT Prophylaxis   ., Place ted hose  Place ted hose    Family Communication:  Patient only  Disposition: Remains inpatient appropriate because: Unsafe discharge plan-deconditioned with ambulation concerns primarily related to recurrent orthostasis-also uncontrolled diabetes at  presentation; severe depression along with language barrier contributing to patient's ability for self-care-care be recommends short-term SNF for rehabilitation; also patient does not have a way to pay for SNF or rehab   A physical therapy consult is indicated based on the patients mobility assessment.   Mobility Assessment (last 72 hours)     Mobility Assessment     Row Name 05/15/21 [phone]       Does patient have an order for bedrest or is patient medically unstable No - Continue assessment     What is the highest level of mobility based on the progressive mobility assessment? Level 6 (Walks independently in room and hall) - Balance while walking in room without assist - Complete     Is the above level different from baseline mobility prior to current illness? No - Consider discontinuing PT/OT                Planned Discharge Destination:  Skilled nursing facility   COVID vaccination status:  Unknown  Consultants: Psychiatry Gastroenterology Procedures: Echocardiogram Antibiotics: None    Time spent: 5 minutes  Author: Erin Hearing, NP 05/17/2021 12:16 PM  For on call review [email or web address].

## 2021-05-18 DIAGNOSIS — R5381 Other malaise: Secondary | ICD-10-CM | POA: Diagnosis not present

## 2021-05-18 DIAGNOSIS — I951 Orthostatic hypotension: Secondary | ICD-10-CM | POA: Diagnosis not present

## 2021-05-18 LAB — GLUCOSE, CAPILLARY
Glucose-Capillary: 171 mg/dL — ABNORMAL HIGH (ref 70–99)
Glucose-Capillary: 209 mg/dL — ABNORMAL HIGH (ref 70–99)
Glucose-Capillary: 251 mg/dL — ABNORMAL HIGH (ref 70–99)
Glucose-Capillary: 170 mg/dL — ABNORMAL HIGH (ref 70–99)

## 2021-05-18 NOTE — Progress Notes (Signed)
Inpatient Diabetes Program Recommendations  AACE/ADA: New Consensus Statement on Inpatient Glycemic Control (2015)  Target Ranges:  Prepandial:   less than 140 mg/dL      Peak postprandial:   less than 180 mg/dL (1-2 hours)      Critically ill patients:  140 - 180 mg/dL   Lab Results  Component Value Date   GLUCAP 209 (H) 05/18/2021   HGBA1C 5.5 05/14/2021    Review of Glycemic Control  Latest Reference Range & Units 05/17/21 11:25 05/17/21 16:22 05/17/21 21:34 05/18/21 08:08 05/18/21 11:16  Glucose-Capillary 70 - 99 mg/dL 144 (H) 818 (H) 563 (H) 171 (H) 209 (H)  Diabetes history: DM2 Outpatient Diabetes medications: Basaglar 40 units QD, metformin 1000 mg BID Current orders for Inpatient glycemic control:  Novolog 0-9 units TID with meals, Tradjenta 5 mg daily   Inpatient Diabetes Program Recommendations:    Please consider restarting Semglee 10 units daily.   Thanks,  Beryl Meager, RN, BC-ADM Inpatient Diabetes Coordinator Pager [phone]  (8a-5p)

## 2021-05-18 NOTE — Progress Notes (Signed)
Progress Note   Patient: Garrett [name] [id number] DOB: 07/21/1985 DOA: 03/04/2021     13 DOS: the patient was seen and examined on 05/18/2021   Brief hospital course: Garrett [name] is a 36 y.o. male with medical history significant of severe depression with psychosis, T2DM and orthostatic hypotension who had been holding in ED for about 2 months time waiting on placement. He was being cared for by members in the community, but they have returned home and he no longer has care. He has been boarding in ED with some hypoglycemic episodes, diarrhea (resolved after metformin adjusted) and some orthostatic hypotension that continues to persist despite stopping his antihypertensive medication as well as addition of SSRI/mood stabilizing drugs.  While in the ER he also had an isolated episode of hyperglycemia with serum glucose 503 with a normal anion gap.  Not consistent with DKA.  Labs were checked and he was noted to have significantly elevated LFTs and with concomitant orthostasis he was referred for admission.  Currently, social work continues to work on placement.  He continues to have significant orthostasis that is limiting his ability to ambulate.     Assessment and Plan: Controlled type 2 diabetes mellitus without complication, without long-term current use of insulin (HCC) Hga1c of 5.9 04/2021 HOME MEDS: Lantus 40 units daily and Metformin XR 500 mg daily Tightly controlled in setting of hypoglycemic events  Stopped metformin 2/2 diarrhea.   2/9 began low-dose Tradjenta Continue SSI Of note patient did have an episode of isolated hyperglycemia with a normal anion gap while in the ER but did not have symptoms consistent with DKA  Chronic Hepatitis B Overall LFTs trending down Appreciate GI assistance Current suspicion is for chronic Hep B with worsening of LFTs in the setting of ischemic injury during orthostasis vs. Drug induced liver injury (risperdal) He will eventually need referral to  infectious disease for management of chronic hep B Checking LFTs every other day-total bilirubin normal with AST in the 200s and ALTs in the 500 AFP tumor marker slightly elevated at 10 and likely more reflective of acute exacerbation and hepatitis process since CT abdomen without any solid liver masses GI recommends outpatient follow-up at [facility] hepatology clinic versus with ID here in [place].  A liver biopsy should be considered. -Although may be a candidate for hepatitis B treatment would need to be initiated in the outpatient setting with subsequent insurance authorization to avoid interruption in treatment.  Also has severe depression which may influence decision to initiate hepatitis B medical therapy.   Orthostatic hypotension likely 2/2 autonomic dysfunction- (present on admission) Has had episodic orthostasis while in ED during the past 2 months and received intermittent IV fluids TSH normal, cortisol checked on 04/14/2021 low at 3.1 but cosyntropin stim test on 2/6 baseline cortisol 11 which ruled out adrenal insufficiency -Continue to hold Flomax -continue TED hose -Discussed with psychiatry, it was felt unlikely that antipsychotics were causing orthostasis. -Due to supine hypertension Florinef discontinued -Midodrine dose increased on 2/14  MDD (major depressive disorder), recurrent, severe, with psychosis ([place])- (present on admission) Appreciate psychiatry assistance Continue lexapro  Risperidone discontinued 2/2 elevated LFTs and Invega initiated by the psychiatric team Continue cogentin Continues to have extremely flat affect and language barrier makes it difficult to effectively allow patient to express feelings and needs.  There may be some cultural issues that are also preventing patient from expressing his emotions   Acute urinary retention- (present on admission) Recurrent incomplete emptying/inability ti void but typucally <  47cc I/O cath prn Flomax dc'd 2/2  orthostasis As of 2/9 patient was able to tell that he needed to void and was able to void successfully as of 2/10 and is continuing to void without difficulty  Anemia Iron panel: Ferritin 696.  B12 in December 2021: 750 and folate 16.5. Hemoglobin stable in the 11 g range.  Outpatient follow-up  Thrombocytopenia (HCC) Mild and intermittent. Follow CBCs periodically. Etiology unclear but remain greater than 100,000. Would avoid Depakote in this patient  Physical deconditioning- (present on admission) PT initially recommended SNF but patient mobility has significantly improved although he still has some episodes of dizziness.  PT has subsequently signed off  Elevated LFTs See Hepatitis B notation      Subjective:  Patient ambulating in room without any complaints.  Does admit to still having a small amount of dizziness.  No pain or other issues.  Communicated utilizing translation service  Physical Exam: Vitals:   05/17/21 2030 05/18/21 0500 05/18/21 0811 05/18/21 1339  BP: 103/66 94/67 100/68 105/75  Pulse: 96 95 91 96  Resp: 18 18 18 20   Temp: 98.1 F (36.7 C) 98.2 F (36.8 C) 98 F (36.7 C) 97.9 F (36.6 C)  TempSrc: Oral Oral Oral Oral  SpO2: 98% 98% 98% 100%  Weight:   49.3 kg   Height:   5\' 2"  (1.575 m)    General: Calm, NAD Respiratory: Bilateral lung sounds are clear to auscultation. Respiratory effort normal.  Room air Cardiovascular: S1 S2, regular pulse.  Normotensive when sitting.  No peripheral edema.  TED hose for recurrent orthostasis are in place. Gastrointestinal: Abdomen is nondistended, soft and nontender. Normal bowel sounds heard. Neurological: Cranial nerves II through XII are grossly intact, patient able to move all extremities x4 with strength 3+-4/5.  Sensation is intact Skin: No rashes, lesions or ulcers.  Psychiatry: Judgement and insight intact.  Mood & affect flat.  Data Reviewed: Orthostatic vital signs reviewed and continues to have  significant drops between supine and standing blood pressure reading   DVT Prophylaxis   ., Place ted hose  Place ted hose    Family Communication:  Patient only  Disposition: Remains inpatient appropriate because: Unsafe discharge plan-deconditioned with ambulation concerns primarily related to recurrent orthostasis-also uncontrolled diabetes at presentation; severe depression along with language barrier contributing to patient's ability for self-care-care be recommends short-term SNF for rehabilitation; also patient does not have a way to pay for SNF or rehab   A physical therapy consult is indicated based on the patients mobility assessment.   Mobility Assessment (last 72 hours)     Mobility Assessment     Row Name 05/17/21 1713 05/17/21 1400 05/17/21 1000   Does patient have an order for bedrest or is patient medically unstable -- -- No - Continue assessment   What is the highest level of mobility based on the progressive mobility assessment? Level 6 (Walks independently in room and hall) - Balance while walking in room without assist - Complete Level 6 (Walks independently in room and hall) - Balance while walking in room without assist - Complete Level 6 (Walks independently in room and hall) - Balance while walking in room without assist - Complete              Planned Discharge Destination:  [place] vaccination status:  Unknown  Consultants: Psychiatry Gastroenterology Procedures: Echocardiogram Antibiotics: None    Time spent: 5 minutes  Author: Erin Hearing, NP 05/18/2021 2:08  PM  For on call review [email or web address].

## 2021-05-18 NOTE — Progress Notes (Signed)
Mobility Specialist Progress Note:   05/18/21 1137  Mobility  Activity Ambulated with assistance in hallway  Level of Assistance Standby assist, set-up cues, supervision of patient - no hands on  Assistive Device None  Distance Ambulated (ft) 350 ft  Activity Response Tolerated well  $Mobility charge 1 Mobility   Pt received in bed willing to participate in mobility. No complaints of pain. Left in bed with call bell in reach and all needs met.   [facility] Public librarian Phone [phone]

## 2021-05-19 DIAGNOSIS — R5381 Other malaise: Secondary | ICD-10-CM | POA: Diagnosis not present

## 2021-05-19 DIAGNOSIS — F333 Major depressive disorder, recurrent, severe with psychotic symptoms: Secondary | ICD-10-CM | POA: Diagnosis not present

## 2021-05-19 DIAGNOSIS — I951 Orthostatic hypotension: Secondary | ICD-10-CM | POA: Diagnosis not present

## 2021-05-19 LAB — GLUCOSE, CAPILLARY
Glucose-Capillary: 163 mg/dL — ABNORMAL HIGH (ref 70–99)
Glucose-Capillary: 257 mg/dL — ABNORMAL HIGH (ref 70–99)
Glucose-Capillary: 248 mg/dL — ABNORMAL HIGH (ref 70–99)
Glucose-Capillary: 250 mg/dL — ABNORMAL HIGH (ref 70–99)

## 2021-05-19 MED ORDER — GLIPIZIDE 5 MG PO TABS
2.5000 mg | ORAL_TABLET | Freq: Every day | ORAL | Status: DC
  Administered 2021-05-19: 2.5 mg via ORAL

## 2021-05-19 NOTE — Progress Notes (Signed)
CSW spoke with Helmut Muster at [facility] regarding patient's pending disability application and she states there are no updates available at this time.  At this time, patient does not have any income to support long term care placement. There are no beds available at [facility]. CSW attempted to reach [place] at [facility] without success - a voicemail was left requesting a return call.  CSW sent patient's demographic information to Roanna Raider to review for assistance.  Edwin Dada, MSW, LCSW Transitions of Care   Clinical Social Worker II [phone]

## 2021-05-19 NOTE — Progress Notes (Signed)
Inpatient Diabetes Program Recommendations  AACE/ADA: New Consensus Statement on Inpatient Glycemic Control (2015)  Target Ranges:  Prepandial:   less than 140 mg/dL      Peak postprandial:   less than 180 mg/dL (1-2 hours)      Critically ill patients:  140 - 180 mg/dL   Lab Results  Component Value Date   GLUCAP 163 (H) 05/19/2021   HGBA1C 5.5 05/14/2021    Review of Glycemic Control  Latest Reference Range & Units 05/18/21 08:08 05/18/21 11:16 05/18/21 16:45 05/18/21 20:25 05/19/21 08:20  Glucose-Capillary 70 - 99 mg/dL 171 (H) 209 (H) 251 (H) 170 (H) 163 (H)   Diabetes history: DM  Outpatient Diabetes medications: Basaglar 40 units daily, Metformin 1000 mg bid Current orders for Inpatient glycemic control:  Glucotrol 2.5 mg daily Tradgenta 5 mg daily Novolog correction Inpatient Diabetes Program Recommendations:    Note that patient was on large dose of basal insulin at home prior to admit.  However he is requiring on Novolog correction here in the hospital.  BMI is only 19.8.  ? If patient is insulin deficient.  Agree with plans to hold Novolog correction for now and check c-peptide/insulin ratio. Will follow.   Thanks,  Adah Perl, RN, BC-ADM Inpatient Diabetes Coordinator Pager [phone]  (8a-5p)

## 2021-05-19 NOTE — Progress Notes (Signed)
Progress Note   Patient: Garrett [name] [id number] DOB: 07/27/1985 DOA: 03/04/2021     14 DOS: the patient was seen and examined on 05/19/2021   Brief hospital course: Garrett [name] is a 36 y.o. male with medical history significant of severe depression with psychosis, T2DM and orthostatic hypotension who had been holding in ED for about 2 months time waiting on placement. He was being cared for by members in the community, but they have returned home and he no longer has care. He has been boarding in ED with some hypoglycemic episodes, diarrhea (resolved after metformin adjusted) and some orthostatic hypotension that continues to persist despite stopping his antihypertensive medication as well as addition of SSRI/mood stabilizing drugs.  While in the ER he also had an isolated episode of hyperglycemia with serum glucose 503 with a normal anion gap.  Not consistent with DKA.  Labs were checked and he was noted to have significantly elevated LFTs and with concomitant orthostasis he was referred for admission.  Currently, social work continues to work on placement.  He continues to have significant orthostasis that is limiting his ability to ambulate.     Assessment and Plan: Controlled type 2 diabetes mellitus without complication, without long-term current use of insulin (HCC) Hga1c of 5.9 04/2021 HOME MEDS: Lantus 40 units daily and Metformin XR 500 mg daily Tightly controlled in setting of hypoglycemic events  Stopped metformin 2/2 diarrhea.   2/9 began low-dose Tradjenta-CBGs > 200 so will add Glucotrol 2.5 mg daily and follow After discussing with diabetes specialist plan is to hold SSI for now.  We will check C-peptide and insulin level to determine most appropriate medication therapy and regimen for this patient Of note patient did have an episode of isolated hyperglycemia with a normal anion gap while in the ER but did not have symptoms consistent with DKA  Chronic Hepatitis B Overall LFTs  trending down Appreciate GI assistance Current suspicion is for chronic Hep B with worsening of LFTs in the setting of ischemic injury during orthostasis vs. Drug induced liver injury (risperdal) He will eventually need referral to infectious disease for management of chronic hep B Checking LFTs every other day-total bilirubin normal with AST in the 200s and ALTs in the 500 AFP tumor marker slightly elevated at 10 and likely more reflective of acute exacerbation and hepatitis process since CT abdomen without any solid liver masses GI recommends outpatient follow-up at [facility] hepatology clinic versus with ID here in [place].  A liver biopsy should be considered. -Although may be a candidate for hepatitis B treatment would need to be initiated in the outpatient setting with subsequent insurance authorization to avoid interruption in treatment.  Also has severe depression which may influence decision to initiate hepatitis B medical therapy.   Orthostatic hypotension likely 2/2 autonomic dysfunction- (present on admission) Has had episodic orthostasis while in ED during the past 2 months and received intermittent IV fluids TSH normal, cortisol checked on 04/14/2021 low at 3.1 but cosyntropin stim test on 2/6 baseline cortisol 11 which ruled out adrenal insufficiency -Continue to hold Flomax -continue TED hose -Discussed with psychiatry, it was felt unlikely that antipsychotics were causing orthostasis. -Due to supine hypertension Florinef discontinued -Midodrine dose increased on 2/14 -Most recent orthostatic vital signs stable with standing SBP in the 90s  MDD (major depressive disorder), recurrent, severe, with psychosis ([place])- (present on admission) Appreciate psychiatry assistance Continue lexapro  Risperidone discontinued 2/2 elevated LFTs and Invega initiated by the psychiatric team Continue cogentin  Continues to have extremely flat affect and language barrier makes it difficult to  effectively allow patient to express feelings and needs.  There may be some cultural issues that are also preventing patient from expressing his emotions   Acute urinary retention- (present on admission) Recurrent incomplete emptying/inability ti void but typucally < 500cc I/O cath prn Flomax dc'd 2/2 orthostasis As of 2/9 patient was able to tell that he needed to void and was able to void successfully as of 2/10 and is continuing to void without difficulty  Anemia Iron panel: Ferritin 696.  B12 in December 2021: 750 and folate 16.5. Hemoglobin stable in the 11 g range.  Outpatient follow-up  Thrombocytopenia (HCC) Mild and intermittent. Follow CBCs periodically. Etiology unclear but remain greater than 100,000. Would avoid Depakote in this patient  Physical deconditioning- (present on admission) PT initially recommended SNF but patient mobility has significantly improved although he still has some episodes of dizziness.  PT has subsequently signed off  Elevated LFTs See Hepatitis B notation      Subjective:  Reporting some dizziness with mobilizing but with prolonged upright position the dizziness resolves.  Physical Exam: Vitals:   05/18/21 1541 05/18/21 2023 05/19/21 0438 05/19/21 0817  BP: 105/78 107/73 (!) 130/105 103/66  Pulse: 89 85 82 89  Resp: 16 19 18 17   Temp: 97.8 F (36.6 C) 97.6 F (36.4 C) 97.7 F (36.5 C) 98.2 F (36.8 C)  TempSrc: Oral Oral Oral Oral  SpO2: 100% 100% 100% 99%  Weight:      Height:       General: Calm, NAD Respiratory: Bilateral lung sounds are clear to auscultation. Respiratory effort normal.  Room air Cardiovascular: S1 S2, regular pulse. TED hose for recurrent orthostasis are in place. Gastrointestinal: Abdomen is nondistended, soft and nontender. Normal bowel sounds heard. LBM 2/13 Neurological: Cranial nerves II through XII are grossly intact, patient able to move all extremities x4 with strength 3+-4/5.  Sensation is  intact Skin: No rashes, lesions or ulcers.  Psychiatry: Judgement and insight intact.  Mood & affect flat-assessment obtained utilizing foreign language translator  Data Reviewed: There are no new results to review at this time.   DVT Prophylaxis   ., Place ted hose  Place ted hose    Family Communication:  Patient only  Disposition: Remains inpatient appropriate because: Unsafe discharge plan-deconditioned with ambulation concerns primarily related to recurrent orthostasis-also uncontrolled diabetes at presentation; severe depression along with language barrier contributing to patient's ability for self-care-care be recommends short-term SNF for rehabilitation; also patient does not have a way to pay for SNF or rehab   A physical therapy consult is indicated based on the patients mobility assessment.   Mobility Assessment (last 72 hours)     Mobility Assessment     Row Name 05/18/21 2100 05/17/21 1713 05/17/21 1400   Does patient have an order for bedrest or is patient medically unstable No - Continue assessment -- --   What is the highest level of mobility based on the progressive mobility assessment? Level 6 (Walks independently in room and hall) - Balance while walking in room without assist - Complete Level 6 (Walks independently in room and hall) - Balance while walking in room without assist - Complete Level 6 (Walks independently in room and hall) - Balance while walking in room without assist - Complete   Is the above level different from baseline mobility prior to current illness? No - Consider discontinuing PT/OT -- --  [place] Name 05/17/21 1000       Does patient have an order for bedrest or is patient medically unstable No - Continue assessment     What is the highest level of mobility based on the progressive mobility assessment? Level 6 (Walks independently in room and hall) - Balance while walking in room without assist - Complete                Planned  Discharge Destination:  Family care home/Rest home or ALF   COVID vaccination status:  Unknown  Consultants: Psychiatry Gastroenterology Procedures: Echocardiogram Antibiotics: None    Time spent: 5 minutes  Author: Erin Hearing, NP 05/19/2021 1:05 PM  For on call review [email or web address].

## 2021-05-19 NOTE — Progress Notes (Signed)
Mobility Specialist: Progress Note   05/19/21 1032  Mobility  Activity Ambulated independently in hallway  Level of Assistance Contact guard assist, steadying assist  Assistive Device None  Distance Ambulated (ft) 390 ft  Activity Response Tolerated well  $Mobility charge 1 Mobility   Pt independent with bed mobility as well as to stand. Some unsteadiness noted when walking in the room requiring contact guard for balance but improved with distance. C/o LLE and back pain during ambulation, no rating given. Pt back to bed after walk with call bell at his side.   [facility] Shanaye Rief Mobility Specialist Mobility Specialist 5 North: [phone] Mobility Specialist 6 North: [phone]

## 2021-05-19 NOTE — Progress Notes (Signed)
Occupational Therapy Treatment Patient Details Name: Garrett [name] MRN: [id number] DOB: 1985-12-14 Today's Date: 05/19/2021   History of present illness Pt is a 36 y.o. male presenting to ED 03/04/21 with withdrawal from friends, lack of oral intake, possibly catatonic behavior; thought to be secondary to MDD. Per nsg pt sustained a fall with unsteady knees early on in his admission. Current issue of hypotension, symptomatic and admitted to floor on 05/05/21 with transaminitis and right sided abdominal pain. PMH includes DM, medication non-compliance, AMS, DKA, acute metabolic encephalopathy, MDD.   OT comments  Pt demonstrating ability to don front opening gown with physical cues to orient. He toileted and groomed independently. No LOB with ambulation in room/bathroom. Decreased frequency to weekly, updated goals.    Recommendations for follow up therapy are one component of a multi-disciplinary discharge planning process, led by the attending physician.  Recommendations may be updated based on patient status, additional functional criteria and insurance authorization.    Follow Up Recommendations  Skilled nursing-short term rehab (<3 hours/day)    Assistance Recommended at Discharge Intermittent Supervision/Assistance  Patient can return home with the following  A little help with bathing/dressing/bathroom;Direct supervision/assist for medications management;Direct supervision/assist for financial management;Assist for transportation;Assistance with cooking/housework   Equipment Recommendations  None recommended by OT    Recommendations for Other Services      Precautions / Restrictions         Mobility Bed Mobility Overal bed mobility: Independent                  Transfers   Equipment used: None   Sit to Stand: Independent                 Balance     Sitting balance-Leahy Scale: Normal     Standing balance support: No upper extremity supported Standing  balance-Leahy Scale: Good                             ADL either performed or assessed with clinical judgement   ADL Overall ADL's : Needs assistance/impaired     Grooming: Oral care;Independent           Upper Body Dressing : Set up;Sitting Upper Body Dressing Details (indicate cue type and reason): some difficulty understanding use as a bathrobe, tied gown in front   Lower Body Dressing Details (indicate cue type and reason): pulled up socks Toilet Transfer: Independent Toilet Transfer Details (indicate cue type and reason): has been routinely taking himself to bathroom Toileting- Clothing Manipulation and Hygiene: Independent       Functional mobility during ADLs: Independent      Extremity/Trunk Assessment              Vision       Perception     Praxis      Cognition Arousal/Alertness: Awake/alert Behavior During Therapy: Flat affect Overall Cognitive Status: Difficult to assess                                 General Comments: slow processing, reporting he had already brushed his teeth, but toothbrush unopened on sink        Exercises      Shoulder Instructions       General Comments      Pertinent Vitals/ Pain       Pain Assessment Pain Assessment: Faces Faces Pain  Scale: Hurts a little bit Pain Location: bottoms of feet Pain Descriptors / Indicators: Discomfort Pain Intervention(s): Monitored during session  Home Living                                          Prior Functioning/Environment              Frequency  Min 1X/week        Progress Toward Goals  OT Goals(current goals can now be found in the care plan section)  Progress towards OT goals: Progressing toward goals  Acute Rehab OT Goals OT Goal Formulation: Patient unable to participate in goal setting Time For Goal Achievement: 05/23/21 Potential to Achieve Goals: Good ADL Goals Additional ADL Goal #1: Patient will  complete 3 step trail making task in preparation for functional IADL/ADL activity. Additional ADL Goal #2: Pt will take a shower with supervision for safety.  Plan Discharge plan remains appropriate;Frequency needs to be updated    Co-evaluation                 AM-PAC OT "6 Clicks" Daily Activity     Outcome Measure   Help from another person eating meals?: None Help from another person taking care of personal grooming?: None Help from another person toileting, which includes using toliet, bedpan, or urinal?: None Help from another person bathing (including washing, rinsing, drying)?: None Help from another person to put on and taking off regular upper body clothing?: A Little Help from another person to put on and taking off regular lower body clothing?: None 6 Click Score: 23    End of Session    OT Visit Diagnosis: Other symptoms and signs involving cognitive function   Activity Tolerance Patient tolerated treatment well   Patient Left in bed;with call bell/phone within reach;with nursing/sitter in room   Nurse Communication          Time: 1421-1436 OT Time Calculation (min): 15 min  Charges: OT General Charges $OT Visit: 1 Visit OT Treatments $Self Care/Home Management : 8-22 mins  Garrett [name], OTR/L Acute Rehabilitation Services Pager: [phone] Office: [phone]   Evern Bio 05/19/2021, 3:36 PM

## 2021-05-20 DIAGNOSIS — I951 Orthostatic hypotension: Secondary | ICD-10-CM | POA: Diagnosis not present

## 2021-05-20 DIAGNOSIS — E119 Type 2 diabetes mellitus without complications: Secondary | ICD-10-CM | POA: Diagnosis not present

## 2021-05-20 DIAGNOSIS — F333 Major depressive disorder, recurrent, severe with psychotic symptoms: Secondary | ICD-10-CM | POA: Diagnosis not present

## 2021-05-20 DIAGNOSIS — R5381 Other malaise: Secondary | ICD-10-CM | POA: Diagnosis not present

## 2021-05-20 LAB — GLUCOSE, CAPILLARY
Glucose-Capillary: 257 mg/dL — ABNORMAL HIGH (ref 70–99)
Glucose-Capillary: 258 mg/dL — ABNORMAL HIGH (ref 70–99)
Glucose-Capillary: 285 mg/dL — ABNORMAL HIGH (ref 70–99)
Glucose-Capillary: 289 mg/dL — ABNORMAL HIGH (ref 70–99)

## 2021-05-20 LAB — INSULIN, RANDOM: Insulin: 51.5 u[IU]/mL — ABNORMAL HIGH (ref 2.6–24.9)

## 2021-05-20 LAB — C-PEPTIDE: C-Peptide: 8.2 ng/mL — ABNORMAL HIGH (ref 1.1–4.4)

## 2021-05-20 MED ORDER — INSULIN ASPART 100 UNIT/ML IJ SOLN
0.0000 [IU] | Freq: Three times a day (TID) | INTRAMUSCULAR | Status: DC
  Administered 2021-05-20: 3 [IU] via SUBCUTANEOUS
  Administered 2021-05-21 (×2): 1 [IU] via SUBCUTANEOUS
  Administered 2021-05-21: 4 [IU] via SUBCUTANEOUS
  Administered 2021-05-22: 5 [IU] via SUBCUTANEOUS
  Administered 2021-05-22: 3 [IU] via SUBCUTANEOUS
  Administered 2021-05-23: 1 [IU] via SUBCUTANEOUS
  Administered 2021-05-23: 2 [IU] via SUBCUTANEOUS
  Administered 2021-05-23 – 2021-05-24 (×2): 1 [IU] via SUBCUTANEOUS
  Administered 2021-05-24: 2 [IU] via SUBCUTANEOUS
  Administered 2021-05-25: 1 [IU] via SUBCUTANEOUS
  Administered 2021-05-25: 2 [IU] via SUBCUTANEOUS
  Administered 2021-05-25 – 2021-05-26 (×3): 1 [IU] via SUBCUTANEOUS
  Administered 2021-05-26: 2 [IU] via SUBCUTANEOUS
  Administered 2021-05-27: 3 [IU] via SUBCUTANEOUS
  Administered 2021-05-27: 2 [IU] via SUBCUTANEOUS
  Administered 2021-05-28: 3 [IU] via SUBCUTANEOUS
  Administered 2021-05-28: 1 [IU] via SUBCUTANEOUS
  Administered 2021-05-28 – 2021-05-29 (×2): 2 [IU] via SUBCUTANEOUS
  Administered 2021-05-29: 1 [IU] via SUBCUTANEOUS
  Administered 2021-05-29: 4 [IU] via SUBCUTANEOUS
  Administered 2021-05-30 (×2): 1 [IU] via SUBCUTANEOUS
  Administered 2021-05-30 – 2021-05-31 (×2): 2 [IU] via SUBCUTANEOUS
  Administered 2021-05-31 – 2021-06-01 (×2): 1 [IU] via SUBCUTANEOUS
  Administered 2021-06-01 – 2021-06-03 (×4): 2 [IU] via SUBCUTANEOUS
  Administered 2021-06-03: 3 [IU] via SUBCUTANEOUS
  Administered 2021-06-04 – 2021-06-05 (×2): 2 [IU] via SUBCUTANEOUS
  Administered 2021-06-06 – 2021-06-15 (×16): 1 [IU] via SUBCUTANEOUS
  Administered 2021-06-17: 3 [IU] via SUBCUTANEOUS
  Administered 2021-06-18 – 2021-06-20 (×5): 1 [IU] via SUBCUTANEOUS
  Administered 2021-06-22 – 2021-06-24 (×2): 2 [IU] via SUBCUTANEOUS
  Administered 2021-06-25 – 2021-07-04 (×6): 1 [IU] via SUBCUTANEOUS
  Administered 2021-07-06: 2 [IU] via SUBCUTANEOUS
  Administered 2021-07-06 – 2021-07-11 (×4): 1 [IU] via SUBCUTANEOUS

## 2021-05-20 MED ORDER — GLIPIZIDE 5 MG PO TABS
5.0000 mg | ORAL_TABLET | Freq: Every day | ORAL | Status: DC
  Administered 2021-05-21 – 2021-05-23 (×3): 5 mg via ORAL
  Filled 2021-05-20 (×3): qty 1

## 2021-05-20 NOTE — Progress Notes (Signed)
Mobility Specialist Progress Note:   05/20/21 1135  Mobility  Activity Ambulated with assistance in hallway  Level of Assistance Standby assist, set-up cues, supervision of patient - no hands on  Assistive Device None  Distance Ambulated (ft) 390 ft  Activity Response Tolerated well  $Mobility charge 1 Mobility   Pt received in bed willing to participate in mobility. No complaints of pain. Pt left in bed with call bell in reach and all needs met.  [facility] Public librarian Phone [phone]

## 2021-05-20 NOTE — Progress Notes (Signed)
Progress Note   [name] DOB: Sep 18, 1985 DOA: 03/04/2021     15 DOS: the patient was seen and examined on 05/20/2021 Needs interpreter/speaks [name]   Brief hospital course: Garrett [name] is a 36 y.o. male with medical history significant of severe depression with psychosis, T2DM and orthostatic hypotension who had been holding in ED for about 2 months time waiting on placement. He was being cared for by members in the community, but they have returned home and he no longer has care. He has been boarding in ED with some hypoglycemic episodes, diarrhea (resolved after metformin adjusted) and some orthostatic hypotension that continues to persist despite stopping his antihypertensive medication as well as addition of SSRI/mood stabilizing drugs.  While in the ER he also had an isolated episode of hyperglycemia with serum glucose 503 with a normal anion gap.  Not consistent with DKA.  Labs were checked and he was noted to have significantly elevated LFTs and with concomitant orthostasis he was referred for admission.  Currently, social work continues to work on placement.  He continues to have significant orthostasis that is limiting his ability to ambulate.     Assessment and Plan: Controlled type 2 diabetes mellitus without complication, without long-term current use of insulin (HCC) Hga1c of 5.9 04/2021 HOME MEDS: Lantus 40 units daily and Metformin XR 500 mg daily Tightly controlled in setting of hypoglycemic events  Stopped metformin 2/2 diarrhea.   Continue Tradjenta and since CBGs remain greater than 200 we will increase Glucotrol to 5 mg daily Both insulin level and C-peptide level elevated consistent with type 2 diabetes melitis/insulin resistance Resume SSI   Chronic Hepatitis B Overall LFTs trending down Appreciate GI assistance Current suspicion is for chronic Hep B with worsening of LFTs in the setting of ischemic injury during orthostasis vs. Drug induced liver  injury (risperdal) He will eventually need referral to infectious disease for management of chronic hep B Checking LFTs every other day-total bilirubin normal with AST in the 200s and ALTs in the 500 AFP tumor marker slightly elevated at 10 and likely more reflective of acute exacerbation and hepatitis process since CT abdomen without any solid liver masses GI recommends outpatient follow-up at [facility] hepatology clinic versus with ID here in [place].  A liver biopsy should be considered. -Although may be a candidate for hepatitis B treatment would need to be initiated in the outpatient setting with subsequent insurance authorization to avoid interruption in treatment.  Also has severe depression which may influence decision to initiate hepatitis B medical therapy.   Orthostatic hypotension likely 2/2 autonomic dysfunction- (present on admission) Has had episodic orthostasis while in ED during the past 2 months and received intermittent IV fluids TSH normal, cortisol checked on 04/14/2021 low at 3.1 but cosyntropin stim test on 2/6 baseline cortisol 11 which ruled out adrenal insufficiency -Continue to hold Flomax -continue TED hose -Discussed with psychiatry, it was felt unlikely that antipsychotics were causing orthostasis. -Due to supine hypertension Florinef discontinued -Midodrine dose increased on 2/14 -Most recent orthostatic vital signs stable with standing SBP in the 90s  MDD (major depressive disorder), recurrent, severe, with psychosis ([place])- (present on admission) Appreciate psychiatry assistance Continue lexapro  Risperidone discontinued 2/2 elevated LFTs and Invega initiated by the psychiatric team Continue cogentin Continues to have extremely flat affect and language barrier makes it difficult to effectively allow patient to express feelings and needs.  There may be some cultural issues that are also preventing patient from expressing his  emotions   Acute urinary retention-  (present on admission) Recurrent incomplete emptying/inability ti void but typucally < 500cc I/O cath prn Flomax dc'd 2/2 orthostasis As of 2/9 patient was able to tell that he needed to void and was able to void successfully as of 2/10 and is continuing to void without difficulty  Anemia Iron panel: Ferritin 696.  B12 in December 2021: 750 and folate 16.5. Hemoglobin stable in the 11 g range.  Outpatient follow-up  Thrombocytopenia (HCC) Mild and intermittent. Follow CBCs periodically. Etiology unclear but remain greater than 100,000. Would avoid Depakote in this patient  Physical deconditioning- (present on admission) PT initially recommended SNF but patient mobility has significantly improved although he still has some episodes of dizziness.  PT has subsequently signed off  Elevated LFTs See Hepatitis B notation      Subjective:  Alert.  No specific complaints.  When asked specifically whether he stated hungry in between meals and would like snacks he stated that he does stay hungry and definitely would like snacks.  Nursing confirms patient's increased appetite stating he is always requesting crackers and other foods to eat.  Physical Exam: Vitals:   05/19/21 1440 05/19/21 2006 05/20/21 0522 05/20/21 0746  BP: 100/76 100/71 95/67 98/68   Pulse: 98 100 95 95  Resp: 17 20 17 18   Temp: 97.7 F (36.5 C) 98.3 F (36.8 C) 98.2 F (36.8 C) 98.2 F (36.8 C)  TempSrc: Oral Oral Oral Oral  SpO2: 100% 99% 99% 96%  Weight:      Height:       General: Calm, NAD Respiratory: Bilateral lung sounds are clear to auscultation. Respiratory effort normal.  Room air Cardiovascular: S1 S2, regular pulse. TED hose for recurrent orthostasis are in place. Gastrointestinal: Abdomen is nondistended, soft and nontender. Normal bowel sounds heard. LBM 2/13 Neurological: Cranial nerves II through XII are grossly intact, patient able to move all extremities x4 with strength 3+-4/5.  Sensation is  intact Skin: No rashes, lesions or ulcers.  Psychiatry: Judgement and insight intact.  Mood & affect flat-assessment obtained utilizing foreign language translator  Data Reviewed: There are no new results to review at this time.   DVT Prophylaxis   ., Place ted hose  Place ted hose    Family Communication:  Patient only use a foreign Ecologist  Disposition: Remains inpatient appropriate because: Unsafe discharge plan-deconditioned with ambulation concerns primarily related to recurrent orthostasis-also uncontrolled diabetes at presentation; severe depression along with language barrier contributing to patient's ability for self-care-care be recommends short-term SNF for rehabilitation; also patient does not have a way to pay for SNF or rehab   A physical therapy consult is indicated based on the patients mobility assessment.   Mobility Assessment (last 72 hours)     Mobility Assessment     Row Name 05/19/21 1500 05/18/21 2100 05/17/21 1713   Does patient have an order for bedrest or is patient medically unstable -- No - Continue assessment --   What is the highest level of mobility based on the progressive mobility assessment? Level 6 (Walks independently in room and hall) - Balance while walking in room without assist - Complete Level 6 (Walks independently in room and hall) - Balance while walking in room without assist - Complete Level 6 (Walks independently in room and hall) - Balance while walking in room without assist - Complete   Is the above level different from baseline mobility prior to current illness? -- No - Consider discontinuing PT/OT --  [place] Name 05/17/21 1400       What is the highest level of mobility based on the progressive mobility assessment? Level 6 (Walks independently in room and hall) - Balance while walking in room without assist - Complete                Planned Discharge Destination:  Family care home/Rest home or ALF   COVID  vaccination status:  Unknown  Consultants: Psychiatry Gastroenterology Procedures: Echocardiogram Antibiotics: None    Time spent: 5 minutes  Author: Erin Hearing, NP 05/20/2021 1:58 PM  For on call review [email or web address].

## 2021-05-21 DIAGNOSIS — R338 Other retention of urine: Secondary | ICD-10-CM | POA: Diagnosis not present

## 2021-05-21 LAB — CBC WITH DIFFERENTIAL/PLATELET
Abs Immature Granulocytes: 0.02 10*3/uL (ref 0.00–0.07)
Basophils Absolute: 0.1 10*3/uL (ref 0.0–0.1)
Basophils Relative: 1 %
Eosinophils Absolute: 0.4 10*3/uL (ref 0.0–0.5)
Eosinophils Relative: 5 %
HCT: 36.8 % — ABNORMAL LOW (ref 39.0–52.0)
Hemoglobin: 12.4 g/dL — ABNORMAL LOW (ref 13.0–17.0)
Immature Granulocytes: 0 %
Lymphocytes Relative: 43 %
Lymphs Abs: 3.7 10*3/uL (ref 0.7–4.0)
MCH: 30.2 pg (ref 26.0–34.0)
MCHC: 33.7 g/dL (ref 30.0–36.0)
MCV: 89.5 fL (ref 80.0–100.0)
Monocytes Absolute: 0.8 10*3/uL (ref 0.1–1.0)
Monocytes Relative: 10 %
Neutro Abs: 3.4 10*3/uL (ref 1.7–7.7)
Neutrophils Relative %: 41 %
Platelets: 177 10*3/uL (ref 150–400)
RBC: 4.11 MIL/uL — ABNORMAL LOW (ref 4.22–5.81)
RDW: 12.7 % (ref 11.5–15.5)
WBC: 8.3 10*3/uL (ref 4.0–10.5)
nRBC: 0 % (ref 0.0–0.2)

## 2021-05-21 LAB — COMPREHENSIVE METABOLIC PANEL
ALT: 278 U/L — ABNORMAL HIGH (ref 0–44)
AST: 115 U/L — ABNORMAL HIGH (ref 15–41)
Albumin: 3.5 g/dL (ref 3.5–5.0)
Alkaline Phosphatase: 84 U/L (ref 38–126)
Anion gap: 7 (ref 5–15)
BUN: 16 mg/dL (ref 6–20)
CO2: 29 mmol/L (ref 22–32)
Calcium: 9.4 mg/dL (ref 8.9–10.3)
Chloride: 97 mmol/L — ABNORMAL LOW (ref 98–111)
Creatinine, Ser: 0.77 mg/dL (ref 0.61–1.24)
GFR, Estimated: >60 mL/min (ref >60)
Glucose, Bld: 226 mg/dL — ABNORMAL HIGH (ref 70–99)
Potassium: 4.3 mmol/L (ref 3.5–5.1)
Sodium: 133 mmol/L — ABNORMAL LOW (ref 135–145)
Total Bilirubin: 0.5 mg/dL (ref 0.3–1.2)
Total Protein: 8.2 g/dL — ABNORMAL HIGH (ref 6.5–8.1)

## 2021-05-21 LAB — GLUCOSE, CAPILLARY
Glucose-Capillary: 188 mg/dL — ABNORMAL HIGH (ref 70–99)
Glucose-Capillary: 231 mg/dL — ABNORMAL HIGH (ref 70–99)
Glucose-Capillary: 175 mg/dL — ABNORMAL HIGH (ref 70–99)
Glucose-Capillary: 329 mg/dL — ABNORMAL HIGH (ref 70–99)
Glucose-Capillary: 268 mg/dL — ABNORMAL HIGH (ref 70–99)

## 2021-05-21 NOTE — Progress Notes (Signed)
Mobility Specialist Progress Note:   05/21/21 1308  Mobility  Activity Ambulated with assistance in hallway  Level of Assistance Independent  Assistive Device None  Distance Ambulated (ft) 390 ft  Activity Response Tolerated well  $Mobility charge 1 Mobility   Received in bed willing to participate in mobility. Complaints of a little pain in legs. Left in bed with cal bell in reach and all needs met.  [facility] Public librarian Phone [phone]

## 2021-05-21 NOTE — Progress Notes (Signed)
PROGRESS NOTE  [name] [id number] DOB: 11-Jun-1985 DOA: 03/04/2021 PCP: Charlott Rakes, MD   LOS: 16 days   Brief Narrative / Interim history: Garrett [name] is a 36 y.o. male with medical history significant of severe depression with psychosis, T2DM and orthostatic hypotension who had been holding in ED for about 2 months time waiting on placement. He was being cared for by members in the community, but they have returned home and he no longer has care. He has been boarding in ED with some hypoglycemic episodes, diarrhea (resolved after metformin adjusted) and some orthostatic hypotension that continues to persist despite stopping his antihypertensive medication as well as addition of SSRI/mood stabilizing drugs.  While in the ER he also had an isolated episode of hyperglycemia with serum glucose 503 with a normal anion gap.  Not consistent with DKA.  Labs were checked and he was noted to have significantly elevated LFTs and with concomitant orthostasis he was referred for admission.  Currently, social work continues to work on placement.  He continues to have significant orthostasis that is limiting his ability to ambulate.    Subjective / 24h Interval events: No complaints this morning.  Answers questions with yes or no and does not elaborate  Assesement and Plan: Active Problems:   MDD (major depressive disorder), recurrent, severe, with psychosis ([place])   Controlled type 2 diabetes mellitus without complication, without long-term current use of insulin (HCC)   Orthostatic hypotension likely 2/2 autonomic dysfunction   Anemia   Physical deconditioning   Thrombocytopenia (HCC)   Acute urinary retention   Chronic Hepatitis B   Assessment and Plan: Principal problem Controlled type 2 diabetes mellitus without complication, without long-term current use of insulin (HCC) -A1C was 5.9 on 04/2021.  Sliding scale, glipizide, Tradjenta  CBG (last 3)  Recent Labs    05/20/21 2048  05/21/21 0849 05/21/21 0943  GLUCAP 289* 188* 231*    Active problems Chronic Hepatitis B -Overall LFTs trending down, gastroenterology consulted and followed patient. Current suspicion is for chronic Hep B with worsening of LFTs in the setting of ischemic injury during orthostasis vs. Drug induced liver injury (risperdal). He will eventually need referral to infectious disease for management of chronic hep B. Checking LFTs every other day-total bilirubin normal with AST in the 200s and ALTs in the 500. AFP tumor marker slightly elevated at 10 and likely more reflective of acute exacerbation and hepatitis process since CT abdomen without any solid liver masses GI recommends outpatient follow-up at [facility] hepatology clinic versus with ID here in [place].  A liver biopsy should be considered. -Although may be a candidate for hepatitis B treatment would need to be initiated in the outpatient setting with subsequent insurance authorization to avoid interruption in treatment.  Also has severe depression which may influence decision to initiate hepatitis B medical therapy.   Orthostatic hypotension likely 2/2 autonomic dysfunction- (present on admission) -Has had episodic orthostasis while in ED during the past 2 months and received intermittent IV fluids. TSH normal, cortisol checked on 04/14/2021 low at 3.1 but cosyntropin stim test on 2/6 baseline cortisol ruled out adrenal insufficiency, 11 at baseline, 24 at 30 minutes and 25 at 60 minutes.  Continue midodrine   MDD (major depressive disorder), recurrent, severe, with psychosis ([place])- (present on admission) -Appreciate psychiatry assistance, continue lexapro, risperidone discontinued 2/2 elevated LFTs and Invega initiated by the psychiatric team. Continue cogentin Continues to have extremely flat affect and language barrier makes it difficult to effectively allow  patient to express feelings and needs.  There may be some cultural issues that are also  preventing patient from expressing his emotions  Acute urinary retention- (present on admission) -Recurrent incomplete emptying/inability ti void but typucally < 500cc, I/O cath prn, Flomax dc'd 2/2 orthostasis   Anemia -Hemoglobin stable   Thrombocytopenia (HCC) -Mild and intermittent.   Scheduled Meds:  benztropine  0.5 mg Oral BID   escitalopram  10 mg Oral Daily   glipiZIDE  5 mg Oral QAC breakfast   insulin aspart  0-6 Units Subcutaneous TID WC   linagliptin  5 mg Oral Daily   LORazepam  0.5 mg Oral QHS   midodrine  10 mg Oral TID WC   paliperidone  3 mg Oral QHS   polyethylene glycol  17 g Oral Daily   senna-docusate  1 tablet Oral QHS   sodium chloride flush  3 mL Intravenous Q12H   thiamine  100 mg Oral Daily   Continuous Infusions:  sodium chloride     PRN Meds:.sodium chloride, acetaminophen, sodium chloride flush  Diet Orders (From admission, onward)     Start     Ordered   05/05/21 1713  Diet Carb Modified Fluid consistency: Thin; Room service appropriate? No  Diet effective now       Question Answer Comment  Diet-HS Snack? Nothing   Calorie Level Medium 1600-2000   Fluid consistency: Thin   Room service appropriate? No      05/05/21 1712            DVT prophylaxis: Place TED hose Start: 05/09/21 2054 Place TED hose Start: 05/05/21 1520   Lab Results  Component Value Date   PLT 177 05/21/2021      Code Status: Full Code  Family Communication: no family available  Status is: Inpatient  Remains inpatient appropriate because: needs placement  Level of care: Med-Surg   Objective: Vitals:   05/20/21 1615 05/20/21 2023 05/21/21 0500 05/21/21 0940  BP: 113/83 115/85 103/71 111/80  Pulse: 95 95 100 (!) 101  Resp: 18 20 19 18   Temp: 98 F (36.7 C) 98.2 F (36.8 C) 98 F (36.7 C) 98.2 F (36.8 C)  TempSrc: Oral Oral Oral Oral  SpO2: 99% 99% 94% 97%  Weight:      Height:        Intake/Output Summary (Last 24 hours) at 05/21/2021  1059 Last data filed at 05/20/2021 2125 Gross per 24 hour  Intake 360 ml  Output --  Net 360 ml   Wt Readings from Last 3 Encounters:  05/18/21 49.3 kg  07/01/20 49 kg  05/26/20 47.6 kg    Examination:  Constitutional: NAD Eyes: no scleral icterus ENMT: Mucous membranes are moist.  Neck: normal, supple Respiratory: clear to auscultation bilaterally, no wheezing, no crackles. Normal respiratory effort. No accessory muscle use.  Cardiovascular: Regular rate and rhythm, no murmurs / rubs / gallops. No LE edema. Abdomen: non distended, no tenderness. Bowel sounds positive.  Musculoskeletal: no clubbing / cyanosis.  Skin: no rashes Neurologic: CN 2-12 grossly intact. Strength 5/5 in all 4.   Data Reviewed: I have independently reviewed following labs and imaging studies   CBC Recent Labs  Lab 05/21/21 0121  WBC 8.3  HGB 12.4*  HCT 36.8*  PLT 177  MCV 89.5  MCH 30.2  MCHC 33.7  RDW 12.7  LYMPHSABS 3.7  MONOABS 0.8  EOSABS 0.4  BASOSABS 0.1    Recent Labs  Lab 05/16/21 0310 05/21/21 0121  NA 137 133*  K 3.9 4.3  CL 101 97*  CO2 31 29  GLUCOSE 259* 226*  BUN 15 16  CREATININE 0.87 0.77  CALCIUM 8.8* 9.4  AST 155* 115*  ALT 340* 278*  ALKPHOS 83 84  BILITOT 0.4 0.5  ALBUMIN 3.0* 3.5    ------------------------------------------------------------------------------------------------------------------ No results for input(s): CHOL, HDL, LDLCALC, TRIG, CHOLHDL, LDLDIRECT in the last 72 hours.  Lab Results  Component Value Date   HGBA1C 5.5 05/14/2021   ------------------------------------------------------------------------------------------------------------------ No results for input(s): TSH, T4TOTAL, T3FREE, THYROIDAB in the last 72 hours.  Invalid input(s): FREET3  Cardiac Enzymes No results for input(s): CKMB, TROPONINI, MYOGLOBIN in the last 168 hours.  Invalid input(s):  CK ------------------------------------------------------------------------------------------------------------------ No results found for: BNP  CBG: Recent Labs  Lab 05/20/21 1233 05/20/21 1618 05/20/21 2048 05/21/21 0849 05/21/21 0943  GLUCAP 258* 285* 289* 188* 231*    No results found for this or any previous visit (from the past 240 hour(s)).   Radiology Studies: No results found.   Marzetta Board, MD, PhD [facility]  Between 7 am - 7 pm I am available, please contact me via Amion (for emergencies) or Securechat (non urgent messages)  Between 7 pm - 7 am I am not available, please contact night coverage MD/APP via Amion

## 2021-05-22 DIAGNOSIS — R338 Other retention of urine: Secondary | ICD-10-CM | POA: Diagnosis not present

## 2021-05-22 LAB — GLUCOSE, CAPILLARY
Glucose-Capillary: 136 mg/dL — ABNORMAL HIGH (ref 70–99)
Glucose-Capillary: 284 mg/dL — ABNORMAL HIGH (ref 70–99)
Glucose-Capillary: 362 mg/dL — ABNORMAL HIGH (ref 70–99)
Glucose-Capillary: 214 mg/dL — ABNORMAL HIGH (ref 70–99)

## 2021-05-22 NOTE — Progress Notes (Signed)
PROGRESS NOTE  Garrett [name] [id number] DOB: 1985/11/13 DOA: 03/04/2021 PCP: Garrett Rakes, MD   LOS: 17 days   Brief Narrative / Interim history: Garrett [name] is a 36 y.o. male with medical history significant of severe depression with psychosis, T2DM and orthostatic hypotension who had been holding in ED for about 2 months time waiting on placement. He was being cared for by members in the community, but they have returned home and he no longer has care. He has been boarding in ED with some hypoglycemic episodes, diarrhea (resolved after metformin adjusted) and some orthostatic hypotension that continues to persist despite stopping his antihypertensive medication as well as addition of SSRI/mood stabilizing drugs.  While in the ER he also had an isolated episode of hyperglycemia with serum glucose 503 with a normal anion gap.  Not consistent with DKA.  Labs were checked and he was noted to have significantly elevated LFTs and with concomitant orthostasis he was referred for admission.  Currently, social work continues to work on placement.  He continues to have significant orthostasis that is limiting his ability to ambulate.    Subjective / 24h Interval events: Patient was seen and examined at bedside.  There were no acute events overnight.  No new complaints.  He is calm.  Assesement and Plan: Active Problems:   MDD (major depressive disorder), recurrent, severe, with psychosis ([place])   Controlled type 2 diabetes mellitus without complication, without long-term current use of insulin (HCC)   Orthostatic hypotension likely 2/2 autonomic dysfunction   Anemia   Physical deconditioning   Thrombocytopenia (HCC)   Acute urinary retention   Chronic Hepatitis B   Assessment and Plan: Principal problem Type 2 diabetes mellitus without complication, without long-term current use of insulin ([place]) with hyperglycemia.-A1C was 5.9 on 04/2021.  Continue sliding scale, glipizide, Tradjenta  CBG (last  3)  Recent Labs    05/21/21 2020 05/22/21 0752 05/22/21 1149  GLUCAP 268* 136* 284*    Active problems Chronic Hepatitis B -Overall LFTs trending down, gastroenterology consulted and followed patient. Current suspicion is for chronic Hep B with worsening of LFTs in the setting of ischemic injury during orthostasis vs. Drug induced liver injury (risperdal). He will eventually need referral to infectious disease for management of chronic hep B. Checking LFTs every other day-total bilirubin normal with AST in the 200s and ALTs in the 500. AFP tumor marker slightly elevated at 10 and likely more reflective of acute exacerbation and hepatitis process since CT abdomen without any solid liver masses GI recommends outpatient follow-up at [facility] hepatology clinic versus with ID here in [place].  A liver biopsy should be considered. -Although may be a candidate for hepatitis B treatment would need to be initiated in the outpatient setting with subsequent insurance authorization to avoid interruption in treatment.  Also has severe depression which may influence decision to initiate hepatitis B medical therapy.   Orthostatic hypotension likely 2/2 autonomic dysfunction- (present on admission) -Has had episodic orthostasis while in ED during the past 2 months and received intermittent IV fluids. TSH normal, cortisol checked on 04/14/2021 low at 3.1 but cosyntropin stim test on 2/6 baseline cortisol ruled out adrenal insufficiency, 11 at baseline, 24 at 30 minutes and 25 at 60 minutes.  Continue midodrine   MDD (major depressive disorder), recurrent, severe, with psychosis ([place])- (present on admission) -Appreciate psychiatry assistance, continue lexapro, risperidone discontinued 2/2 elevated LFTs and Invega initiated by the psychiatric team. Continue cogentin Continues to have extremely flat affect and  language barrier makes it difficult to effectively allow patient to express feelings and needs.  There may be  some cultural issues that are also preventing patient from expressing his emotions  Acute urinary retention- (present on admission) - Flomax dc'd 2/2 orthostasis.  Continue to monitor, not on Foley catheter.   Chronic normocytic anemia, mild-Hemoglobin stable.  12.4 from 11.5.  No overt bleeding.   Resolved thrombocytopenia (HCC) -likely secondary to acute illness.   Scheduled Meds:  benztropine  0.5 mg Oral BID   escitalopram  10 mg Oral Daily   glipiZIDE  5 mg Oral QAC breakfast   insulin aspart  0-6 Units Subcutaneous TID WC   linagliptin  5 mg Oral Daily   LORazepam  0.5 mg Oral QHS   midodrine  10 mg Oral TID WC   paliperidone  3 mg Oral QHS   polyethylene glycol  17 g Oral Daily   senna-docusate  1 tablet Oral QHS   sodium chloride flush  3 mL Intravenous Q12H   thiamine  100 mg Oral Daily   Continuous Infusions:  sodium chloride     PRN Meds:.sodium chloride, acetaminophen, sodium chloride flush  Diet Orders (From admission, onward)     Start     Ordered   05/05/21 1713  Diet Carb Modified Fluid consistency: Thin; Room service appropriate? No  Diet effective now       Question Answer Comment  Diet-HS Snack? Nothing   Calorie Level Medium 1600-2000   Fluid consistency: Thin   Room service appropriate? No      05/05/21 1712            DVT prophylaxis: Place TED hose Start: 05/09/21 2054 Place TED hose Start: 05/05/21 1520   Lab Results  Component Value Date   PLT 177 05/21/2021      Code Status: Full Code  Family Communication: no family available  Status is: Inpatient  Remains inpatient appropriate because: needs placement  Level of care: Med-Surg   Objective: Vitals:   05/21/21 1822 05/21/21 2019 05/22/21 0624 05/22/21 0754  BP: 121/87 112/74 107/81 99/71  Pulse: 95 92 92 94  Resp: 18 16 16 16   Temp: 97.9 F (36.6 C) 97.8 F (36.6 C) 98.1 F (36.7 C) 98.1 F (36.7 C)  TempSrc: Oral Oral Oral Oral  SpO2: 98% 98% 99% 98%  Weight:       Height:        Intake/Output Summary (Last 24 hours) at 05/22/2021 1246 Last data filed at 05/22/2021 1057 Gross per 24 hour  Intake 480 ml  Output --  Net 480 ml   Wt Readings from Last 3 Encounters:  05/18/21 49.3 kg  07/01/20 49 kg  05/26/20 47.6 kg    Examination:  Constitutional: No acute distress.  Alert and interactive. Eyes: no scleral icterus ENMT: Mucous membranes are moist.  Neck: normal, supple Respiratory: Clear to auscultation no wheeze or rales. Cardiovascular: Regular rate and rhythm no rubs or gallops.   Abdomen: non distended, no tenderness. Bowel sounds positive.  Musculoskeletal: No clubbing or cyanosis. Skin: no rashes Neurologic: Moves all 4 extremities.  Non focal    Data Reviewed: I have independently reviewed following labs and imaging studies   CBC Recent Labs  Lab 05/21/21 0121  WBC 8.3  HGB 12.4*  HCT 36.8*  PLT 177  MCV 89.5  MCH 30.2  MCHC 33.7  RDW 12.7  LYMPHSABS 3.7  MONOABS 0.8  EOSABS 0.4  BASOSABS 0.1  Recent Labs  Lab 05/16/21 0310 05/21/21 0121  NA 137 133*  K 3.9 4.3  CL 101 97*  CO2 31 29  GLUCOSE 259* 226*  BUN 15 16  CREATININE 0.87 0.77  CALCIUM 8.8* 9.4  AST 155* 115*  ALT 340* 278*  ALKPHOS 83 84  BILITOT 0.4 0.5  ALBUMIN 3.0* 3.5    ------------------------------------------------------------------------------------------------------------------ No results for input(s): CHOL, HDL, LDLCALC, TRIG, CHOLHDL, LDLDIRECT in the last 72 hours.  Lab Results  Component Value Date   HGBA1C 5.5 05/14/2021   ------------------------------------------------------------------------------------------------------------------ No results for input(s): TSH, T4TOTAL, T3FREE, THYROIDAB in the last 72 hours.  Invalid input(s): FREET3  Cardiac Enzymes No results for input(s): CKMB, TROPONINI, MYOGLOBIN in the last 168 hours.  Invalid input(s):  CK ------------------------------------------------------------------------------------------------------------------ No results found for: BNP  CBG: Recent Labs  Lab 05/21/21 1213 05/21/21 1828 05/21/21 2020 05/22/21 0752 05/22/21 1149  GLUCAP 175* 329* 268* 136* 284*    No results found for this or any previous visit (from the past 240 hour(s)).   Radiology Studies: No results found.   Irene Pap, MD, PhD [facility]  Between 7 am - 7 pm I am available, please contact me via Amion (for emergencies) or Securechat (non urgent messages)  Between 7 pm - 7 am I am not available, please contact night coverage MD/APP via Amion

## 2021-05-22 NOTE — Progress Notes (Signed)
Mobility Specialist Progress Note:   05/22/21 1142  Mobility  Activity Ambulated with assistance in hallway  Level of Assistance Independent  Assistive Device None  Distance Ambulated (ft) 390 ft  Activity Response Tolerated well  $Mobility charge 1 Mobility   Pt received in bed willing to participate in mobility. No complaints of pain and asymptomatic. Pt left in bed with call bell in reach and all needs met.   [facility] Public librarian Phone [phone]

## 2021-05-23 LAB — GLUCOSE, CAPILLARY
Glucose-Capillary: 167 mg/dL — ABNORMAL HIGH (ref 70–99)
Glucose-Capillary: 154 mg/dL — ABNORMAL HIGH (ref 70–99)
Glucose-Capillary: 189 mg/dL — ABNORMAL HIGH (ref 70–99)
Glucose-Capillary: 110 mg/dL — ABNORMAL HIGH (ref 70–99)

## 2021-05-23 LAB — COMPREHENSIVE METABOLIC PANEL
ALT: 249 U/L — ABNORMAL HIGH (ref 0–44)
AST: 110 U/L — ABNORMAL HIGH (ref 15–41)
Albumin: 3.4 g/dL — ABNORMAL LOW (ref 3.5–5.0)
Alkaline Phosphatase: 68 U/L (ref 38–126)
Anion gap: 7 (ref 5–15)
BUN: 18 mg/dL (ref 6–20)
CO2: 30 mmol/L (ref 22–32)
Calcium: 9 mg/dL (ref 8.9–10.3)
Chloride: 99 mmol/L (ref 98–111)
Creatinine, Ser: 0.76 mg/dL (ref 0.61–1.24)
GFR, Estimated: >60 mL/min (ref >60)
Glucose, Bld: 170 mg/dL — ABNORMAL HIGH (ref 70–99)
Potassium: 4 mmol/L (ref 3.5–5.1)
Sodium: 136 mmol/L (ref 135–145)
Total Bilirubin: 0.2 mg/dL — ABNORMAL LOW (ref 0.3–1.2)
Total Protein: 8.3 g/dL — ABNORMAL HIGH (ref 6.5–8.1)

## 2021-05-23 MED ORDER — GLIPIZIDE 5 MG PO TABS
7.5000 mg | ORAL_TABLET | Freq: Every day | ORAL | Status: DC
  Administered 2021-05-24 – 2021-05-26 (×3): 7.5 mg via ORAL
  Filled 2021-05-23 (×3): qty 2

## 2021-05-23 NOTE — TOC Progression Note (Addendum)
Transition of Care [facility]) - Progression Note    Patient Details  Name: Garrett [name] MRN: [id number] Date of Birth: April 13, 1985  Transition of Care [facility]) CM/SW [place], [place] Phone Number: 05/23/2021, 1:28 PM  Clinical Narrative:     CSW attempted to follow up with the [facility] regarding pt's disability application, they are closed for the holiday, but will reopen tomorrow. CSW attempted to speak with [facility], Unable to leave a VM. TOC/ DTP will continue to follow for DC needs.       Expected Discharge Plan and Services                                                 Social Determinants of Health (SDOH) Interventions    Readmission Risk Interventions No flowsheet data found.

## 2021-05-23 NOTE — Progress Notes (Signed)
Progress Note   PatientGamaliel [name] [id number] DOB: 08-31-1985 DOA: 03/04/2021     18 DOS: the patient was seen and examined on 05/23/2021 Needs interpreter/speaks [name]   Brief hospital course: Garrett [name] is a 36 y.o. male with medical history significant of severe depression with psychosis, T2DM and orthostatic hypotension who had been holding in ED for about 2 months time waiting on placement. He was being cared for by members in the community, but they have returned home and he no longer has care. He has been boarding in ED with some hypoglycemic episodes, diarrhea (resolved after metformin adjusted) and some orthostatic hypotension that continues to persist despite stopping his antihypertensive medication as well as addition of SSRI/mood stabilizing drugs.  While in the ER he also had an isolated episode of hyperglycemia with serum glucose 503 with a normal anion gap.  Not consistent with DKA.  Labs were checked and he was noted to have significantly elevated LFTs and with concomitant orthostasis he was referred for admission.  Currently, social work continues to work on placement.  He continues to have significant orthostasis that is limiting his ability to ambulate.     Assessment and Plan: Controlled type 2 diabetes mellitus without complication, without long-term current use of insulin (HCC) Hga1c of 5.9 04/2021 HOME MEDS: Lantus 40 units daily and Metformin XR 500 mg daily Tightly controlled in setting of hypoglycemic events  Stopped metformin 2/2 diarrhea.   Continue Tradjenta  CBGs have increased to greater than 300 in context of patient eating more food with allowance of snacks.  Will increase Glucotrol to 7.5 mg daily  Chronic Hepatitis B Overall LFTs trending down Appreciate GI assistance Current suspicion is for chronic Hep B with worsening of LFTs in the setting of ischemic injury during orthostasis vs. Drug induced liver injury (risperdal) He will eventually need referral to  infectious disease for management of chronic hep B Checking LFTs every other day-total bilirubin normal with AST in the 200s and ALTs in the 500 AFP tumor marker slightly elevated at 10 and likely more reflective of acute exacerbation and hepatitis process since CT abdomen without any solid liver masses GI recommends outpatient follow-up at [facility] hepatology clinic versus with ID here in [place].  A liver biopsy should be considered. -Although may be a candidate for hepatitis B treatment would need to be initiated in the outpatient setting with subsequent insurance authorization to avoid interruption in treatment.  Also has severe depression which may influence decision to initiate hepatitis B medical therapy.   Orthostatic hypotension likely 2/2 autonomic dysfunction- (present on admission) Has had episodic orthostasis while in ED during the past 2 months and received intermittent IV fluids TSH normal, cortisol checked on 04/14/2021 low at 3.1 but cosyntropin stim test on 2/6 baseline cortisol 11 which ruled out adrenal insufficiency -Continue to hold Flomax -continue TED hose -Discussed with psychiatry, it was felt unlikely that antipsychotics were causing orthostasis. -Due to supine hypertension Florinef discontinued -Midodrine dose increased on 2/14 -Most recent orthostatic vital signs stable with standing SBP in the 90s  MDD (major depressive disorder), recurrent, severe, with psychosis ([place])- (present on admission) Appreciate psychiatry assistance Continue lexapro  Risperidone discontinued 2/2 elevated LFTs and Invega initiated by the psychiatric team Continue cogentin Continues to have extremely flat affect and language barrier makes it difficult to effectively allow patient to express feelings and needs.  There may be some cultural issues that are also preventing patient from expressing his emotions   Acute urinary  retention- (present on admission) Recurrent incomplete  emptying/inability ti void but typucally < 500cc I/O cath prn Flomax dc'd 2/2 orthostasis As of 2/9 patient was able to tell that he needed to void and was able to void successfully as of 2/10 and is continuing to void without difficulty  Anemia Iron panel: Ferritin 696.  B12 in December 2021: 750 and folate 16.5. Hemoglobin stable in the 11 g range.  Outpatient follow-up  Thrombocytopenia (HCC) Mild and intermittent. Follow CBCs periodically. Etiology unclear but remain greater than 100,000. Would avoid Depakote in this patient  Physical deconditioning- (present on admission) PT initially recommended SNF but patient mobility has significantly improved although he still has some episodes of dizziness.  PT has subsequently signed off  Elevated LFTs See Hepatitis B notation      Subjective:  Sitting up in bed watching TV.  Note bedside table covered in empty food containers.  Unable to locate telemetry translator machine but was able to communicate enough with patient to determine that he had no complaints and felt like he was getting enough to eat.  Physical Exam: Vitals:   05/22/21 1659 05/22/21 2023 05/23/21 0440 05/23/21 0730  BP: 115/90 114/78 98/70 104/73  Pulse: (!) 103 93 85 97  Resp: 16 16 17 16   Temp: 98 F (36.7 C) 97.7 F (36.5 C) 98 F (36.7 C) 98.4 F (36.9 C)  TempSrc: Oral Oral  Oral  SpO2: 100% 99% 97% 98%  Weight:      Height:       General: Calm, NAD Respiratory: Bilateral lung sounds are clear to auscultation. Respiratory effort normal.  Room air Cardiovascular: S1 S2, regular pulse. TED hose for recurrent orthostasis are in place. Gastrointestinal: Abdomen is nondistended, soft and nontender. Normal bowel sounds heard. LBM 2/18 Neurological: Cranial nerves II through XII are grossly intact, patient able to move all extremities x4 with strength 3+-4/5.  Sensation is intact Skin: No rashes, lesions or ulcers.  Psychiatry: Judgement and insight  intact.  Mood & affect flat-assessment obtained utilizing foreign language translator  Data Reviewed: There are no new results to review at this time.   DVT Prophylaxis   ., Place ted hose  Place ted hose    Family Communication:  Patient only use a foreign Ecologist  Disposition: Remains inpatient appropriate because: Unsafe discharge plan-deconditioned with ambulation concerns primarily related to recurrent orthostasis-also uncontrolled diabetes at presentation; severe depression along with language barrier contributing to patient's ability for self-care-care be recommends short-term SNF for rehabilitation; also patient does not have a way to pay for SNF or rehab   A physical therapy consult is indicated based on the patients mobility assessment.   Mobility Assessment (last 72 hours)     Mobility Assessment     Row Name 05/23/21 1024 05/22/21 1945 05/22/21 1057   Does patient have an order for bedrest or is patient medically unstable No - Continue assessment No - Continue assessment No - Continue assessment   What is the highest level of mobility based on the progressive mobility assessment? Level 5 (Walks with assist in room/hall) - Balance while stepping forward/back and can walk in room with assist - Complete Level 4 (Walks with assist in room) - Balance while marching in place and cannot step forward and back - Complete Level 5 (Walks with assist in room/hall) - Balance while stepping forward/back and can walk in room with assist - Complete    Row Name 05/21/21 2120 05/21/21 0815  Does patient have an order for bedrest or is patient medically unstable No - Continue assessment No - Continue assessment    What is the highest level of mobility based on the progressive mobility assessment? Level 5 (Walks with assist in room/hall) - Balance while stepping forward/back and can walk in room with assist - Complete Level 6 (Walks independently in room and hall) - Balance  while walking in room without assist - Complete    Is the above level different from baseline mobility prior to current illness? -- No - Consider discontinuing PT/OT               Planned Discharge Destination:  Family care home/Rest home or ALF   COVID vaccination status:  Unknown  Consultants: Psychiatry Gastroenterology Procedures: Echocardiogram Antibiotics: None    Time spent: 5 minutes  Author: Erin Hearing, NP 05/23/2021 11:51 AM  For on call review [email or web address].

## 2021-05-23 NOTE — Progress Notes (Signed)
Mobility Specialist Progress Note:   05/23/21 1123  Mobility  Activity Ambulated with assistance in hallway  Level of Assistance Independent  Assistive Device None  Distance Ambulated (ft) 390 ft  Activity Response Tolerated well  $Mobility charge 1 Mobility   Pt received in bed willing to participate in mobility. Complaints of back pain. Pt left in bed with call bell in reach and all needs met.   [facility] Public librarian Phone [phone]

## 2021-05-23 NOTE — Progress Notes (Signed)
Pt was seen for PT with translation from PCI, and pt was providing minimal responses to questions.  BP is 124/63 pregait sitting with sat 100% and pulse 87, post gait was 145/84, pulse 76 and sat 100%.  Follow along weekly for progression to follow up care as MD determines is appropriate.  05/23/21 1920  PT Visit Information  Last PT Received On 05/23/21  Assistance Needed +1  History of Present Illness Pt is a 36 y.o. male presenting to ED 03/04/21 with withdrawal from friends, lack of oral intake, possibly catatonic behavior; thought to be secondary to MDD. Per nsg pt sustained a fall with unsteady knees early on in his admission. Current issue of hypotension, symptomatic and admitted to floor on 05/05/21 with transaminitis and right sided abdominal pain. PMH includes DM, medication non-compliance, AMS, DKA, acute metabolic encephalopathy, MDD.  Subjective Data  Subjective via translation reports being dizzy  Patient Stated Goal none reported  Precautions  Precaution Comments not orthostatic during PT session 2/20  Restrictions  Weight Bearing Restrictions No  Pain Assessment  Pain Assessment Faces  Faces Pain Scale 2  Breathing 0  Negative Vocalization 0  Facial Expression 0  Body Language 0  Consolability 0  PAINAD Score 0  Pain Location bottoms of feet  Pain Descriptors / Indicators Guarding  Pain Intervention(s) Monitored during session;Repositioned  Cognition  Arousal/Alertness Awake/alert  Behavior During Therapy Flat affect  Overall Cognitive Status Difficult to assess  General Comments translation answers are very limited and short  Difficult to assess due to Non-English speaking  Bed Mobility  Overal bed mobility Independent  Transfers  Overall transfer level Independent  Ambulation/Gait  Ambulation/Gait assistance Independent  Gait Distance (Feet) 150 Feet  Assistive device None  Gait Pattern/deviations Step-through pattern;Narrow base of support  General Gait Details  Slow, steady gait indep without DME; pt endorses slight dizziness at end of ambulation, wearing ted hose, BP 133/88  Gait velocity normal  Gait velocity interpretation <1.31 ft/sec, indicative of household ambulator  Balance  Sitting-balance support Feet supported  Sitting balance-Leahy Scale Normal  Standing balance support No upper extremity supported  Standing balance-Leahy Scale Good  General Comments  General comments (skin integrity, edema, etc.) BP was monitored and not orthostatic with gait  PT - End of Session  Equipment Utilized During Treatment Gait belt  Activity Tolerance Patient tolerated treatment well  Patient left in bed;with call bell/phone within reach  Nurse Communication Mobility status   PT - Assessment/Plan  PT Plan Current plan remains appropriate  PT Visit Diagnosis Adult, failure to thrive (R62.7);Muscle weakness (generalized) (M62.81);History of falling (Z91.81);Other abnormalities of gait and mobility (R26.89)  PT Frequency (ACUTE ONLY) Min 1X/week  Recommendations for Other Services Rehab consult  Follow Up Recommendations No PT follow up  Assistance recommended at discharge PRN  Patient can return home with the following Assist for transportation;Assistance with cooking/housework;Direct supervision/assist for financial management;Direct supervision/assist for medications management  PT equipment None recommended by PT  AM-PAC PT "6 Clicks" Mobility Outcome Measure (Version 2)  Help needed turning from your back to your side while in a flat bed without using bedrails? 4  Help needed moving from lying on your back to sitting on the side of a flat bed without using bedrails? 4  Help needed moving to and from a bed to a chair (including a wheelchair)? 4  Help needed standing up from a chair using your arms (e.g., wheelchair or bedside chair)? 4  Help needed to walk in  hospital room? 4  Help needed climbing 3-5 steps with a railing?  3  6 Click Score 23   Consider Recommendation of Discharge To: Home with no services  Progressive Mobility  What is the highest level of mobility based on the progressive mobility assessment? Level 5 (Walks with assist in room/hall) - Balance while stepping forward/back and can walk in room with assist - Complete  PT Time Calculation  PT Start Time (ACUTE ONLY) 1446  PT Stop Time (ACUTE ONLY) 1510  PT Time Calculation (min) (ACUTE ONLY) 24 min  PT General Charges  $$ ACUTE PT VISIT 1 Visit  PT Treatments  $Gait Training 8-22 mins  $Therapeutic Activity 8-22 mins   Mee Hives, PT PhD Acute Rehab Dept. Number: [place] [facility] [place]

## 2021-05-24 DIAGNOSIS — M545 Low back pain, unspecified: Secondary | ICD-10-CM

## 2021-05-24 LAB — GLUCOSE, CAPILLARY
Glucose-Capillary: 124 mg/dL — ABNORMAL HIGH (ref 70–99)
Glucose-Capillary: 194 mg/dL — ABNORMAL HIGH (ref 70–99)
Glucose-Capillary: 200 mg/dL — ABNORMAL HIGH (ref 70–99)
Glucose-Capillary: 228 mg/dL — ABNORMAL HIGH (ref 70–99)
Glucose-Capillary: 195 mg/dL — ABNORMAL HIGH (ref 70–99)

## 2021-05-24 MED ORDER — METHOCARBAMOL 500 MG PO TABS: 500.0000 mg | ORAL_TABLET | Freq: Three times a day (TID) | ORAL | Status: DC | PRN

## 2021-05-24 NOTE — Assessment & Plan Note (Addendum)
Controlled on prn Robaxin and Tylenol Continue to encourage increased mobility especially outside of room to minimize back pain Patient complaining of radicular pain.  CT of pelvis and lumbar spine unremarkable and remained stable with no evidence of nerve root encroachment Continue to encourage patient to get out of bed and mobilize to minimize back pain.

## 2021-05-24 NOTE — Progress Notes (Signed)
CSW attempted to reach admissions at [facility] without success - a voicemail was left requesting a return call.  CSW attempted to reach [place] at [facility] without success - a voicemail was left requesting a return call.  Madilyn Fireman, MSW, LCSW Transitions of Care   Clinical Social Worker II [phone]

## 2021-05-24 NOTE — Progress Notes (Signed)
Mobility Specialist Progress Note:   05/24/21 1248  Mobility  Activity Ambulated with assistance in hallway  Level of Assistance Independent after set-up  Assistive Device None  Distance Ambulated (ft) 390 ft  Activity Response Tolerated well  $Mobility charge 1 Mobility   Pt received in bed willing to participate in mobility. Complaints of ankles and legs hurting but did not give rating. Left in bed with call bell in reach and all needs met.   [facility] Public librarian Phone [phone]

## 2021-05-24 NOTE — Progress Notes (Addendum)
Progress Note   PatientAmal [name] [id number] DOB: [birth year] DOA: 03/04/2021     19 DOS: the patient was seen and examined on 05/24/2021 Needs interpreter/speaks [name]   Brief hospital course: [name] is a 36 y.o. male with medical history significant of severe depression with psychosis, T2DM and orthostatic hypotension who had been holding in ED for about 2 months time waiting on placement. He was being cared for by members in the community, but they have returned home and he no longer has care. He has been boarding in ED with some hypoglycemic episodes, diarrhea (resolved after metformin adjusted) and some orthostatic hypotension that continues to persist despite stopping his antihypertensive medication as well as addition of SSRI/mood stabilizing drugs.  While in the ER he also had an isolated episode of hyperglycemia with serum glucose 503 with a normal anion gap.  Not consistent with DKA.  Labs were checked and he was noted to have significantly elevated LFTs and with concomitant orthostasis he was referred for admission.  Currently, social work continues to work on placement.  He continues to have significant orthostasis that is limiting his ability to ambulate.     Assessment and Plan: Controlled type 2 diabetes mellitus without complication, without long-term current use of insulin (HCC) Hga1c of 5.9 04/2021 HOME MEDS: Lantus 40 units daily and Metformin XR 500 mg daily Tightly controlled in setting of hypoglycemic events  Stopped metformin 2/2 diarrhea.   Continue Tradjenta  CBGs much better controlled after Glucotrol increased  Chronic Hepatitis B Overall LFTs trending down Appreciate GI assistance Current suspicion is for chronic Hep B with worsening of LFTs in the setting of ischemic injury during orthostasis vs. Drug induced liver injury (risperdal) He will eventually need referral to infectious disease for management of chronic hep B Checking LFTs every other day-total  bilirubin normal with AST in the 200s and ALTs in the 500 AFP tumor marker slightly elevated at 10 and likely more reflective of acute exacerbation and hepatitis process since CT abdomen without any solid liver masses GI recommends outpatient follow-up at [facility] hepatology clinic versus with ID here in [place].  A liver biopsy should be considered. -Although may be a candidate for hepatitis B treatment would need to be initiated in the outpatient setting with subsequent insurance authorization to avoid interruption in treatment.  Also has severe depression which may influence decision to initiate hepatitis B medical therapy.   Orthostatic hypotension likely 2/2 autonomic dysfunction- (present on admission) Has had episodic orthostasis while in ED during the past 2 months and received intermittent IV fluids TSH normal, cortisol checked on 04/14/2021 low at 3.1 but cosyntropin stim test on 2/6 baseline cortisol 11 which ruled out adrenal insufficiency -Continue to hold Flomax -continue TED hose -Discussed with psychiatry, it was felt unlikely that antipsychotics were causing orthostasis. -Due to supine hypertension Florinef discontinued -Midodrine dose increased on 2/14 -Most recent orthostatic vital signs stable with standing SBP in the 90s  MDD (major depressive disorder), recurrent, severe, with psychosis ([facility])- (present on admission) Appreciate psychiatry assistance Continue lexapro  Risperidone discontinued 2/2 elevated LFTs and Invega initiated by the psychiatric team Continue cogentin Continues to have extremely flat affect and language barrier makes it difficult to effectively allow patient to express feelings and needs.  There may be some cultural issues that are also preventing patient from expressing his emotions Patient is severely socially isolated noting he has no visitors and only receives communication in his native language during short visits by staff  into the room.  I have  reached out to multiple care team members to see if they can assist in decreasing his social isolation.  Through the translator I let the patient know that if he watches August and other educational programs for children that should help him start learning English.  I have also explored the possibility of finding native language literature and hopefully some DVD/VHS movies that the patient can watch Tempted to get the patient to open up about his feelings and concerns but again limited 1-2 word responses through the translator.  Of note patient was not participating in any type of organized religion prior to admission so unable to reach out to culturally appropriate pastoral care at this time.  I specifically asked if he was Buddhist, Muslim or Darrick Meigs and he stated no   Acute urinary retention- (present on admission) Recurrent incomplete emptying/inability ti void but typucally < 500cc I/O cath prn Flomax dc'd 2/2 orthostasis As of 2/9 patient was able to tell that he needed to void and was able to void successfully as of 2/10 and is continuing to void without difficulty  Anemia Iron panel: Ferritin 696.  B12 in December 2021: 750 and folate 16.5. Hemoglobin stable in the 11 g range.  Outpatient follow-up  Thrombocytopenia (HCC) Mild and intermittent. Follow CBCs periodically. Etiology unclear but remain greater than 100,000. Would avoid Depakote in this patient  Physical deconditioning- (present on admission) PT initially recommended SNF but patient mobility has significantly improved although he still has some episodes of dizziness.  PT has subsequently signed off  Low back pain Patient reports low back pain with sitting that improves somewhat with walking. On exam patient was tender directly over spine as well as over musculature bilaterally on low back Patient is requesting medication to help pain so have started with Tylenol and if ineffective can have Robaxin.  Patient is  aware that we need to watch for urinary retention with Robaxin since has been a problem previously during the admission Patient also encouraged to spend more time out of the bed and walking around the unit since staying in the bed can contribute to low back pain  Elevated LFTs See Hepatitis B notation      Subjective:  Interviewed patient via Administrator.  Attempted to get patient to open up about feelings in isolation but he continues to respond back to translator with 1 or 2 word responses.  He was able to relate that he is having low back pain with sitting that does improve somewhat with walking but he would like something to help decrease the pain.  Physical Exam: Vitals:   05/23/21 1655 05/23/21 1914 05/24/21 0539 05/24/21 1017  BP: 128/86 126/87 116/81 109/77  Pulse: 89 84 86 97  Resp: 16 17 18 18   Temp: 98.1 F (36.7 C) 98 F (36.7 C) 97.7 F (36.5 C) 97.7 F (36.5 C)  TempSrc: Oral Oral    SpO2: 100% 100% 99% 100%  Weight:      Height:       General: Calm, NAD Respiratory: Bilateral lung sounds are clear to auscultation. Respiratory effort normal.  Room air Cardiovascular: S1 S2, regular pulse. TED hose for recurrent orthostasis are in place. Gastrointestinal: Abdomen is nondistended, soft and nontender. Normal bowel sounds heard. LBM 2/20 Neurological: Cranial nerves II through XII are grossly intact, patient able to move all extremities x4 with strength 3+-4/5.  Sensation is intact Skin: No rashes, lesions or ulcers.  Psychiatry: Judgement  and insight intact.  Mood & affect flat-assessment obtained utilizing foreign language translator  Data Reviewed: There are no new results to review at this time.   DVT Prophylaxis   ., Place ted hose  Place ted hose    Family Communication:  Patient only use a foreign Ecologist  Disposition: Remains inpatient appropriate because: Unsafe discharge plan-deconditioned with ambulation concerns primarily  related to recurrent orthostasis-also uncontrolled diabetes at presentation; severe depression along with language barrier contributing to patient's ability for self-care-care be recommends short-term SNF for rehabilitation; also patient does not have a way to pay for SNF or rehab   A physical therapy consult is indicated based on the patients mobility assessment.   Mobility Assessment (last 72 hours)     Mobility Assessment     Row Name 05/24/21 0711 05/23/21 1920 05/23/21 1024   Does patient have an order for bedrest or is patient medically unstable No - Continue assessment No - Continue assessment No - Continue assessment   What is the highest level of mobility based on the progressive mobility assessment? Level 6 (Walks independently in room and hall) - Balance while walking in room without assist - Complete Level 5 (Walks with assist in room/hall) - Balance while stepping forward/back and can walk in room with assist - Complete Level 5 (Walks with assist in room/hall) - Balance while stepping forward/back and can walk in room with assist - Complete   Is the above level different from baseline mobility prior to current illness? No - Consider discontinuing PT/OT -- --    Row Name 05/22/21 1945 05/22/21 1057 05/21/21 2120   Does patient have an order for bedrest or is patient medically unstable No - Continue assessment No - Continue assessment No - Continue assessment   What is the highest level of mobility based on the progressive mobility assessment? Level 4 (Walks with assist in room) - Balance while marching in place and cannot step forward and back - Complete Level 5 (Walks with assist in room/hall) - Balance while stepping forward/back and can walk in room with assist - Complete Level 5 (Walks with assist in room/hall) - Balance while stepping forward/back and can walk in room with assist - Complete              Planned Discharge Destination:  Family care home/Rest home or  ALF   COVID vaccination status:  Unknown  Consultants: Psychiatry Gastroenterology Procedures: Echocardiogram Antibiotics: None    Time spent: 5 minutes  Author: Erin Hearing, NP 05/24/2021 12:52 PM  For on call review [email or web address].

## 2021-05-25 LAB — GLUCOSE, CAPILLARY
Glucose-Capillary: 198 mg/dL — ABNORMAL HIGH (ref 70–99)
Glucose-Capillary: 164 mg/dL — ABNORMAL HIGH (ref 70–99)
Glucose-Capillary: 224 mg/dL — ABNORMAL HIGH (ref 70–99)
Glucose-Capillary: 202 mg/dL — ABNORMAL HIGH (ref 70–99)

## 2021-05-25 MED ORDER — METHOCARBAMOL 500 MG PO TABS
500.0000 mg | ORAL_TABLET | Freq: Two times a day (BID) | ORAL | Status: AC
  Administered 2021-05-25 – 2021-05-28 (×8): 500 mg via ORAL
  Filled 2021-05-25 (×7): qty 1

## 2021-05-25 MED ORDER — METHOCARBAMOL 500 MG PO TABS
500.0000 mg | ORAL_TABLET | Freq: Three times a day (TID) | ORAL | Status: DC | PRN
  Administered 2021-05-29 – 2021-07-06 (×10): 500 mg via ORAL
  Filled 2021-05-25 (×10): qty 1

## 2021-05-25 NOTE — Progress Notes (Signed)
Mobility Specialist Progress Note:   05/25/21 1258  Mobility  Activity Ambulated with assistance in hallway  Level of Assistance Independent  Assistive Device None  Distance Ambulated (ft) 500 ft  Activity Response Tolerated well  $Mobility charge 1 Mobility   Pt received in bed willing to participate in mobility. No complaints of pain and asymptomatic. Pt left in bed with call bell in reach and all needs met.   [facility] Public librarian Phone [phone]

## 2021-05-25 NOTE — Progress Notes (Signed)
Occupational Therapy Treatment and Discharge Patient Details Name: Garrett [name] MRN: [id number] DOB: [birth year] Today's Date: 05/25/2021   History of present illness Pt is a 36 y.o. male presenting to ED 03/04/21 with withdrawal from friends, lack of oral intake, possibly catatonic behavior; thought to be secondary to MDD. Per nsg pt sustained a fall with unsteady knees early on in his admission.  Hospital course complicated by hypotension, transaminitis and right sided abdominal pain, now resolved. PMH includes DM, medication non-compliance, AMS, DKA, acute metabolic encephalopathy, MDD.   OT comments  Pt does not initiate ADLs, with exception of toileting and eating, unless cued, but is able to complete independently. No longer requires acute OT. Pt would benefit from a routine in which he is expected to complete ADLs at a predetermined time and order with nursing staff.    Recommendations for follow up therapy are one component of a multi-disciplinary discharge planning process, led by the attending physician.  Recommendations may be updated based on patient status, additional functional criteria and insurance authorization.    Follow Up Recommendations  Long-term institutional care without follow-up therapy    Assistance Recommended at Discharge Intermittent Supervision/Assistance  Patient can return home with the following  A little help with bathing/dressing/bathroom;Direct supervision/assist for medications management;Direct supervision/assist for financial management;Assist for transportation;Assistance with cooking/housework   Equipment Recommendations  None recommended by OT    Recommendations for Other Services      Precautions / Restrictions Precautions Precautions: None       Mobility Bed Mobility Overal bed mobility: Independent                  Transfers Overall transfer level: Independent Equipment used: None                     Balance     Sitting  balance-Leahy Scale: Normal       Standing balance-Leahy Scale: Good                             ADL either performed or assessed with clinical judgement   ADL Overall ADL's : Independent                                     Functional mobility during ADLs: Independent General ADL Comments: Pt requires cues to complete ADLs, does not initiate on his own. Reports, "I already did that," when asked whether he had brushed his teeth when there was no toothbrush in his room.    Extremity/Trunk Assessment              Vision       Perception     Praxis      Cognition Arousal/Alertness: Awake/alert Behavior During Therapy: Flat affect Overall Cognitive Status: Difficult to assess                                 General Comments: limited attempt to verbalize, follows simple commands with multimodal cues        Exercises      Shoulder Instructions       General Comments      Pertinent Vitals/ Pain       Pain Assessment Pain Assessment: Faces Faces Pain Scale: No hurt  Home Living  Prior Functioning/Environment              Frequency           Progress Toward Goals  OT Goals(current goals can now be found in the care plan section)  Progress towards OT goals: Goals met/education completed, patient discharged from [place] All goals met and education completed, patient discharged from OT services    Co-evaluation                 AM-PAC OT "6 Clicks" Daily Activity     Outcome Measure   Help from another person eating meals?: None Help from another person taking care of personal grooming?: None Help from another person toileting, which includes using toliet, bedpan, or urinal?: None Help from another person bathing (including washing, rinsing, drying)?: None Help from another person to put on and taking off regular upper body clothing?:  None Help from another person to put on and taking off regular lower body clothing?: None 6 Click Score: 24    End of Session    OT Visit Diagnosis: Other symptoms and signs involving cognitive function   Activity Tolerance Patient tolerated treatment well   Patient Left in bed;with call bell/phone within reach   Nurse Communication          Time: 2415-9017 OT Time Calculation (min): 15 min  Charges: OT General Charges $OT Visit: 1 Visit OT Treatments $Self Care/Home Management : 8-22 mins  Nestor Lewandowsky, OTR/L Acute Rehabilitation Services Pager: [phone] Office: [phone]  Malka So 05/25/2021, 10:38 AM

## 2021-05-25 NOTE — Progress Notes (Signed)
Progress Note   Patient: Garrett [name] DOB: 1985/09/13 DOA: 03/04/2021     20 DOS: the patient was seen and examined on 05/25/2021 Needs interpreter/speaks [name]   Brief hospital course: Garrett [name] is a 36 y.o. male with medical history significant of severe depression with psychosis, T2DM and orthostatic hypotension who had been holding in ED for about 2 months time waiting on placement. He was being cared for by members in the community, but they have returned home and he no longer has care. He has been boarding in ED with some hypoglycemic episodes, diarrhea (resolved after metformin adjusted) and some orthostatic hypotension that continues to persist despite stopping his antihypertensive medication as well as addition of SSRI/mood stabilizing drugs.  While in the ER he also had an isolated episode of hyperglycemia with serum glucose 503 with a normal anion gap.  Not consistent with DKA.  Labs were checked and he was noted to have significantly elevated LFTs and with concomitant orthostasis he was referred for admission.  Currently, social work continues to work on placement.  He continues to have significant orthostasis that is limiting his ability to ambulate.     Assessment and Plan: Controlled type 2 diabetes mellitus without complication, without long-term current use of insulin (HCC) Hga1c of 5.9 04/2021 HOME MEDS: Lantus 40 units daily and Metformin XR 500 mg daily Tightly controlled in setting of hypoglycemic events  Stopped metformin 2/2 diarrhea.   Continue Tradjenta  CBGs much better controlled after Glucotrol increased  Chronic Hepatitis B Overall LFTs trending down Appreciate GI assistance Current suspicion is for chronic Hep B with worsening of LFTs in the setting of ischemic injury during orthostasis vs. Drug induced liver injury (risperdal) He will eventually need referral to infectious disease for management of chronic hep B Checking LFTs every other day-total  bilirubin normal with AST in the 200s and ALTs in the 500 AFP tumor marker slightly elevated at 10 and likely more reflective of acute exacerbation and hepatitis process since CT abdomen without any solid liver masses GI recommends outpatient follow-up at [facility] hepatology clinic versus with ID here in [place].  A liver biopsy should be considered. -Although may be a candidate for hepatitis B treatment would need to be initiated in the outpatient setting with subsequent insurance authorization to avoid interruption in treatment.  Also has severe depression which may influence decision to initiate hepatitis B medical therapy.   Orthostatic hypotension likely 2/2 autonomic dysfunction- (present on admission) Has had episodic orthostasis while in ED during the past 2 months and received intermittent IV fluids TSH normal, cortisol checked on 04/14/2021 low at 3.1 but cosyntropin stim test on 2/6 baseline cortisol 11 which ruled out adrenal insufficiency -Continue to hold Flomax -continue TED hose -Discussed with psychiatry, it was felt unlikely that antipsychotics were causing orthostasis. -Due to supine hypertension Florinef discontinued -Midodrine dose increased on 2/14 -Most recent orthostatic vital signs stable with standing SBP in the 90s  MDD (major depressive disorder), recurrent, severe, with psychosis ([place])- (present on admission) Appreciate psychiatry assistance Continue lexapro  Risperidone discontinued 2/2 elevated LFTs and Invega initiated by the psychiatric team Continue cogentin Continues to have extremely flat affect and language barrier makes it difficult to effectively allow patient to express feelings and needs.  There may be some cultural issues that are also preventing patient from expressing his emotions Patient is severely socially isolated noting he has no visitors and only receives communication in his native language during short visits by staff  into the room.  I have  reached out to multiple care team members to see if they can assist in decreasing his social isolation.  Through the translator I let the patient know that if he watches [place] and other educational programs for children that should help him start learning English.  I have also explored the possibility of finding native language literature and hopefully some DVD/VHS movies that the patient can watch Tempted to get the patient to open up about his feelings and concerns but again limited 1-2 word responses through the translator.  Of note patient was not participating in any type of organized religion prior to admission so unable to reach out to culturally appropriate pastoral care at this time.  I specifically asked if he was Buddhist, Muslim or Darrick Meigs and he stated no   Acute urinary retention- (present on admission) Recurrent incomplete emptying/inability ti void but typucally < 500cc I/O cath prn Flomax dc'd 2/2 orthostasis As of 2/9 patient was able to tell that he needed to void and was able to void successfully as of 2/10 and is continuing to void without difficulty  Anemia Iron panel: Ferritin 696.  B12 in December 2021: 750 and folate 16.5. Hemoglobin stable in the 11 g range.  Outpatient follow-up  Thrombocytopenia (HCC) Mild and intermittent. Follow CBCs periodically. Etiology unclear but remain greater than 100,000. Would avoid Depakote in this patient  Physical deconditioning- (present on admission) PT initially recommended SNF but patient mobility has significantly improved although he still has some episodes of dizziness.  PT has subsequently signed off  Low back pain Continues to have low back pain that improved with Tylenol and Robaxin as needed 2/22 have ordered low-dose Robaxin BID x3 days then will transition back to Robaxin 500 TID prn Continue to encourage increased mobility especially outside of room to minimize back pain  Elevated LFTs See Hepatitis B  notation      Subjective:  Patient reports back pain slightly better.  He states he was given some pills but does not know which ones.  Still having some back pain and through translator told patient I would give him Robaxin that he does not have to ask for for the next 3 days then it will be available as needed.  Patient agreed.  Physical Exam: Vitals:   05/24/21 1525 05/24/21 2044 05/25/21 0528 05/25/21 0747  BP: 105/75 113/79 97/68 108/80  Pulse: 95 94 97 96  Resp: 17 16 17 15   Temp: 97.6 F (36.4 C) 98.3 F (36.8 C) 98.6 F (37 C) 98.3 F (36.8 C)  TempSrc: Oral  Oral Oral  SpO2: 100% 98% 100% 98%  Weight:      Height:       General: Calm, NAD Respiratory: Bilateral lung sounds are clear to auscultation. Respiratory effort normal.  Room air Cardiovascular: S1 S2, regular pulse. TED hose for recurrent orthostasis are in place. Gastrointestinal: Abdomen is nondistended, soft and nontender. Normal bowel sounds heard. LBM 2/20 Neurological: Cranial nerves II through XII are grossly intact, patient able to move all extremities x4 with strength 5/5.  Sensation is intact Skin: No rashes, lesions or ulcers.  Psychiatry: Judgement and insight intact.  Mood & affect flat-assessment obtained utilizing foreign language translator  Data Reviewed: There are no new results to review at this time.   DVT Prophylaxis   ., Place ted hose  Place ted hose    Family Communication:  Patient only use a foreign Ecologist  Disposition: Remains  inpatient appropriate because: Unsafe discharge plan-deconditioned with ambulation concerns primarily related to recurrent orthostasis-also uncontrolled diabetes at presentation; severe depression along with language barrier contributing to patient's ability for self-care-care be recommends short-term SNF for rehabilitation; also patient does not have a way to pay for SNF or rehab Please review any new documentation from CM/LCSW   A  physical therapy consult is indicated based on the patients mobility assessment.   Mobility Assessment (last 72 hours)     Mobility Assessment     Row Name 05/25/21 1000 05/24/21 0711 05/23/21 1920   Does patient have an order for bedrest or is patient medically unstable -- No - Continue assessment No - Continue assessment   What is the highest level of mobility based on the progressive mobility assessment? Level 6 (Walks independently in room and hall) - Balance while walking in room without assist - Complete Level 6 (Walks independently in room and hall) - Balance while walking in room without assist - Complete Level 5 (Walks with assist in room/hall) - Balance while stepping forward/back and can walk in room with assist - Complete   Is the above level different from baseline mobility prior to current illness? -- No - Consider discontinuing PT/OT --    Row Name 05/23/21 1024 05/22/21 1945     Does patient have an order for bedrest or is patient medically unstable No - Continue assessment No - Continue assessment    What is the highest level of mobility based on the progressive mobility assessment? Level 5 (Walks with assist in room/hall) - Balance while stepping forward/back and can walk in room with assist - Complete Level 4 (Walks with assist in room) - Balance while marching in place and cannot step forward and back - Complete               Planned Discharge Destination:  Family care home/Rest home or ALF   COVID vaccination status:  Unknown  Consultants: Psychiatry Gastroenterology Procedures: Echocardiogram Antibiotics: None    Time spent: 5 minutes  Author: Erin Hearing, NP 05/25/2021 1:27 PM  For on call review [email or web address].

## 2021-05-26 DIAGNOSIS — M545 Low back pain, unspecified: Secondary | ICD-10-CM

## 2021-05-26 LAB — GLUCOSE, CAPILLARY
Glucose-Capillary: 160 mg/dL — ABNORMAL HIGH (ref 70–99)
Glucose-Capillary: 181 mg/dL — ABNORMAL HIGH (ref 70–99)
Glucose-Capillary: 191 mg/dL — ABNORMAL HIGH (ref 70–99)
Glucose-Capillary: 271 mg/dL — ABNORMAL HIGH (ref 70–99)

## 2021-05-26 NOTE — Progress Notes (Signed)
Mobility Specialist Progress Note:   05/26/21 1221  Mobility  Activity Ambulated with assistance in hallway  Level of Assistance Independent  Assistive Device None  Distance Ambulated (ft) 500 ft  Activity Response Tolerated well  $Mobility charge 1 Mobility   Pt received in bed willing to participate in mobility. No complaints of pain and asymptomatic. Pt left in bed with call bell in reach and all needs met.   [facility] Public librarian Phone [phone]

## 2021-05-26 NOTE — Progress Notes (Signed)
Progress Note   PatientLeaman [name] OTL:[id number] DOB: July 04, 1985 DOA: 03/04/2021     21 DOS: the patient was seen and examined on 05/26/2021 Needs interpreter/speaks [name]   Brief hospital course: Garrett [name] is a 36 y.o. male with medical history significant of severe depression with psychosis, T2DM and orthostatic hypotension who had been holding in ED for about 2 months time waiting on placement. He was being cared for by members in the community, but they have returned home and he no longer has care. He has been boarding in ED with some hypoglycemic episodes, diarrhea (resolved after metformin adjusted) and some orthostatic hypotension that continues to persist despite stopping his antihypertensive medication as well as addition of SSRI/mood stabilizing drugs.  While in the ER he also had an isolated episode of hyperglycemia with serum glucose 503 with a normal anion gap.  Not consistent with DKA.  Labs were checked and he was noted to have significantly elevated LFTs and with concomitant orthostasis he was referred for admission.  Currently, social work continues to work on placement.  He continues to have significant orthostasis that is limiting his ability to ambulate.     Assessment and Plan: Controlled type 2 diabetes mellitus without complication, without long-term current use of insulin (HCC) Hga1c of 5.9 04/2021 HOME MEDS: Lantus 40 units daily and Metformin XR 500 mg daily Tightly controlled in setting of hypoglycemic events  Stopped metformin 2/2 diarrhea.   Continue Tradjenta  CBGs much better controlled after Glucotrol increased Stays very hungry and requests "crackers' (Graham crackers)- encouraged to eat protein such as peanut butter with crackers  Chronic Hepatitis B Overall LFTs trending down Appreciate GI assistance Current suspicion is for chronic Hep B with worsening of LFTs in the setting of ischemic injury during orthostasis vs. Drug induced liver injury (risperdal) He  will eventually need referral to infectious disease for management of chronic hep B Checking LFTs every other day-total bilirubin normal with AST in the 200s and ALTs in the 500 AFP tumor marker slightly elevated at 10 and likely more reflective of acute exacerbation and hepatitis process since CT abdomen without any solid liver masses GI recommends outpatient follow-up at [facility] hepatology clinic versus with ID here in [place].  A liver biopsy should be considered. -Although may be a candidate for hepatitis B treatment would need to be initiated in the outpatient setting with subsequent insurance authorization to avoid interruption in treatment.  Also has severe depression which may influence decision to initiate hepatitis B medical therapy.   Orthostatic hypotension likely 2/2 autonomic dysfunction- (present on admission) Has had episodic orthostasis while in ED during the past 2 months and received intermittent IV fluids TSH normal, cortisol checked on 04/14/2021 low at 3.1 but cosyntropin stim test on 2/6 baseline cortisol 11 which ruled out adrenal insufficiency -Continue to hold Flomax -continue TED hose -Discussed with psychiatry, it was felt unlikely that antipsychotics were causing orthostasis. -Due to supine hypertension Florinef discontinued -Midodrine dose increased on 2/14 -Most recent orthostatic vital signs stable with standing SBP in the 90s  MDD (major depressive disorder), recurrent, severe, with psychosis (HCC)- (present on admission) Appreciate psychiatry assistance Continue lexapro  Risperidone discontinued 2/2 elevated LFTs and Invega initiated by the psychiatric team Continue cogentin Continues to have extremely flat affect and language barrier makes it difficult to effectively allow patient to express feelings and needs.  There may be some cultural issues that are also preventing patient from expressing his emotions Patient is severely socially isolated  noting he has  no visitors and only receives communication in his native language during short visits by staff into the room.  I have reached out to multiple care team members to see if they can assist in decreasing his social isolation.  Through the translator I let the patient know that if he watches [place] and other educational programs for children that should help him start learning English.  I have also explored the possibility of finding native language literature and hopefully some DVD/VHS movies that the patient can watch Tempted to get the patient to open up about his feelings and concerns but again limited 1-2 word responses through the translator.  Of note patient was not participating in any type of organized religion prior to admission so unable to reach out to culturally appropriate pastoral care at this time.  I specifically asked if he was Buddhist, Muslim or Darrick Meigs and he stated no   Acute urinary retention- (present on admission) Recurrent incomplete emptying/inability ti void but typucally < 500cc I/O cath prn Flomax dc'd 2/2 orthostasis As of 2/9 patient was able to tell that he needed to void and was able to void successfully as of 2/10 and is continuing to void without difficulty  Anemia Iron panel: Ferritin 696.  B12 in December 2021: 750 and folate 16.5. Hemoglobin stable in the 11 g range.  Outpatient follow-up  Thrombocytopenia (HCC) Mild and intermittent. Follow CBCs periodically. Etiology unclear but remain greater than 100,000. Would avoid Depakote in this patient  Physical deconditioning- (present on admission) PT initially recommended SNF but patient mobility has significantly improved although he still has some episodes of dizziness.  PT has subsequently signed off  Low back pain Controlled 2/22 have ordered low-dose Robaxin BID x3 days then will transition back to Robaxin 500 TID prn Continue to encourage increased mobility especially outside of room to minimize  back pain  Elevated LFTs See Hepatitis B notation      Subjective:  No current back pain  Physical Exam: Vitals:   05/25/21 1653 05/25/21 2047 05/26/21 0543 05/26/21 0826  BP: 108/73 112/78 107/75 111/73  Pulse: 90 92 91 89  Resp: 15 16 17 18   Temp: 97.7 F (36.5 C) 98.1 F (36.7 C) 98.2 F (36.8 C) 97.8 F (36.6 C)  TempSrc: Oral Oral Oral   SpO2: 100% 100% 99% 100%  Weight:      Height:       General: Calm, NAD Respiratory: Bilateral lung sounds are clear to auscultation. Respiratory effort normal.  Room air Cardiovascular: S1 S2, regular pulse. TED hose for recurrent orthostasis are in place. Gastrointestinal: Abdomen is nondistended, soft and nontender. Normal bowel sounds heard. LBM 2/20 Neurological: Cranial nerves II through XII are grossly intact, patient able to move all extremities x4 with strength 5/5.  Sensation is intact Skin: No rashes, lesions or ulcers.  Psychiatry: Judgement and insight intact.  Mood & affect flat-assessment obtained utilizing foreign language translator  Data Reviewed: There are no new results to review at this time.   DVT Prophylaxis   ., Place ted hose  Place ted hose    Family Communication:  Patient only use a foreign Ecologist  Disposition: Remains inpatient appropriate because: Unsafe discharge plan-deconditioned with ambulation concerns primarily related to recurrent orthostasis-also uncontrolled diabetes at presentation; severe depression along with language barrier contributing to patient's ability for self-care-care be recommends short-term SNF for rehabilitation; also patient does not have a way to pay for SNF or rehab Please  review any new documentation from CM/LCSW   A physical therapy consult is indicated based on the patients mobility assessment.   Mobility Assessment (last 72 hours)     Mobility Assessment     Row Name 05/25/21 1000 05/24/21 0711 05/23/21 1920   Does patient have an order for  bedrest or is patient medically unstable -- No - Continue assessment No - Continue assessment   What is the highest level of mobility based on the progressive mobility assessment? Level 6 (Walks independently in room and hall) - Balance while walking in room without assist - Complete Level 6 (Walks independently in room and hall) - Balance while walking in room without assist - Complete Level 5 (Walks with assist in room/hall) - Balance while stepping forward/back and can walk in room with assist - Complete   Is the above level different from baseline mobility prior to current illness? -- No - Consider discontinuing PT/OT --              Planned Discharge Destination:  Family care home/Rest home or ALF   COVID vaccination status:  Unknown  Consultants: Psychiatry Gastroenterology Procedures: Echocardiogram Antibiotics: None    Time spent: 5 minutes  Author: Erin Hearing, NP 05/26/2021 11:22 AM  For on call review [email or web address].

## 2021-05-27 LAB — GLUCOSE, CAPILLARY
Glucose-Capillary: 212 mg/dL — ABNORMAL HIGH (ref 70–99)
Glucose-Capillary: 143 mg/dL — ABNORMAL HIGH (ref 70–99)
Glucose-Capillary: 258 mg/dL — ABNORMAL HIGH (ref 70–99)
Glucose-Capillary: 265 mg/dL — ABNORMAL HIGH (ref 70–99)

## 2021-05-27 MED ORDER — GLUCERNA SHAKE PO LIQD
237.0000 mL | Freq: Three times a day (TID) | ORAL | Status: DC
  Administered 2021-05-27 – 2021-07-12 (×117): 237 mL via ORAL
  Filled 2021-05-27 (×3): qty 237

## 2021-05-27 MED ORDER — GLIPIZIDE 5 MG PO TABS
10.0000 mg | ORAL_TABLET | Freq: Every day | ORAL | Status: DC
  Administered 2021-05-27 – 2021-06-09 (×14): 10 mg via ORAL
  Filled 2021-05-27 (×14): qty 2

## 2021-05-27 NOTE — Progress Notes (Signed)
Mobility Specialist: Progress Note   05/27/21 1706  Mobility  Activity Ambulated independently in hallway  Level of Assistance Independent  Assistive Device None  Distance Ambulated (ft) 390 ft  Activity Response Tolerated well  $Mobility charge 1 Mobility   Pt received in bed and agreeable to ambulation. C/o back pain during session, no rating given, otherwise asymptomatic. Pt back to bed after mobility with call bell and phone in reach.   [facility] Koreena Joost Mobility Specialist Mobility Specialist 5 North: [phone] Mobility Specialist 6 North: [phone]

## 2021-05-27 NOTE — Progress Notes (Signed)
Progress Note   PatientJkai Elfman [id number] DOB: 09-18-1985 DOA: 03/04/2021     22 DOS: the patient was seen and examined on 05/27/2021 Needs interpreter/speaks [name]   Brief hospital course: Takuma Canning is a 36 y.o. male with medical history significant of severe depression with psychosis, T2DM and orthostatic hypotension who had been holding in ED for about 2 months time waiting on placement. He was being cared for by members in the community, but they have returned home and he no longer has care. He has been boarding in ED with some hypoglycemic episodes, diarrhea (resolved after metformin adjusted) and some orthostatic hypotension that continues to persist despite stopping his antihypertensive medication as well as addition of SSRI/mood stabilizing drugs.  While in the ER he also had an isolated episode of hyperglycemia with serum glucose 503 with a normal anion gap.  Not consistent with DKA.  Labs were checked and he was noted to have significantly elevated LFTs and with concomitant orthostasis he was referred for admission.  Currently, social work continues to work on placement.  He continues to have significant orthostasis that is limiting his ability to ambulate.     Assessment and Plan: Controlled type 2 diabetes mellitus without complication, without long-term current use of insulin (HCC) Hga1c of 5.9 04/2021 HOME MEDS: Lantus 40 units daily and Metformin XR 500 mg daily Tightly controlled in setting of hypoglycemic events  Stopped metformin 2/2 diarrhea.   Continue Tradjenta  CBGs slightly over 200 since pt has increased PO intake (always hungry). 2/24: Increase Glucotrol to 10 mg daily and add Glucerna shakes TID Encouraged to eat protein such as peanut butter with crackers  Chronic Hepatitis B Overall LFTs trending down Appreciate GI assistance Current suspicion is for chronic Hep B with worsening of LFTs in the setting of ischemic injury during orthostasis vs. Drug induced  liver injury (risperdal) He will eventually need referral to infectious disease for management of chronic hep B Checking LFTs every other day-total bilirubin normal with AST in the 200s and ALTs in the 500 AFP tumor marker slightly elevated at 10 and likely more reflective of acute exacerbation and hepatitis process since CT abdomen without any solid liver masses GI recommends outpatient follow-up at [facility] hepatology clinic versus with ID here in [place].  A liver biopsy should be considered. -Although may be a candidate for hepatitis B treatment would need to be initiated in the outpatient setting with subsequent insurance authorization to avoid interruption in treatment.  Also has severe depression which may influence decision to initiate hepatitis B medical therapy.   Orthostatic hypotension likely 2/2 autonomic dysfunction- (present on admission) Has had episodic orthostasis while in ED during the past 2 months and received intermittent IV fluids TSH normal, cortisol checked on 04/14/2021 low at 3.1 but cosyntropin stim test on 2/6 baseline cortisol 11 which ruled out adrenal insufficiency -Continue to hold Flomax -continue TED hose -Discussed with psychiatry, it was felt unlikely that antipsychotics were causing orthostasis. -Due to supine hypertension Florinef discontinued -Midodrine dose increased on 2/14 -Most recent orthostatic vital signs stable with standing SBP in the 90s  MDD (major depressive disorder), recurrent, severe, with psychosis ([place])- (present on admission) Appreciate psychiatry assistance Continue lexapro  Risperidone discontinued 2/2 elevated LFTs and Invega initiated by the psychiatric team Continue cogentin Continues to have extremely flat affect and language barrier makes it difficult to effectively allow patient to express feelings and needs.  There may be some cultural issues that are also preventing patient  from expressing his emotions Patient is severely  socially isolated noting he has no visitors and only receives communication in his native language during short visits by staff into the room.  I have reached out to multiple care team members to see if they can assist in decreasing his social isolation.  Through the translator I let the patient know that if he watches [place] and other educational programs for children that should help him start learning English.  I have also explored the possibility of finding native language literature and hopefully some DVD/VHS movies that the patient can watch Tempted to get the patient to open up about his feelings and concerns but again limited 1-2 word responses through the translator.  Of note patient was not participating in any type of organized religion prior to admission so unable to reach out to culturally appropriate pastoral care at this time.  I specifically asked if he was Buddhist, Muslim or Darrick Meigs and he stated no   Acute urinary retention- (present on admission) Recurrent incomplete emptying/inability ti void but typucally < 500cc I/O cath prn Flomax dc'd 2/2 orthostasis As of 2/9 patient was able to tell that he needed to void and was able to void successfully as of 2/10 and is continuing to void without difficulty  Anemia Iron panel: Ferritin 696.  B12 in December 2021: 750 and folate 16.5. Hemoglobin stable in the 11 g range.  Outpatient follow-up  Thrombocytopenia (HCC) Mild and intermittent. Follow CBCs periodically. Etiology unclear but remain greater than 100,000. Would avoid Depakote in this patient  Physical deconditioning- (present on admission) PT initially recommended SNF but patient mobility has significantly improved although he still has some episodes of dizziness.  PT has subsequently signed off  Low back pain Controlled 2/22 have ordered low-dose Robaxin BID x3 days then will transition back to Robaxin 500 TID prn Continue to encourage increased mobility  especially outside of room to minimize back pain  Elevated LFTs See Hepatitis B notation      Subjective:  Using translator updated patient on need to increase Glucotrol pill due to increased intake.  Also encouraged to eat more protein as opposed to carbohydrates and informed him I was adding a diabetic appropriate protein shake.  Patient replied understanding through translator.  Physical Exam: Vitals:   05/26/21 1655 05/26/21 2126 05/27/21 0520 05/27/21 0802  BP: 117/89 128/87 125/84 102/72  Pulse: 100 88 92 91  Resp: 18 16 17 18   Temp: 97.9 F (36.6 C) (!) 97.5 F (36.4 C) 97.8 F (36.6 C) (!) 97.5 F (36.4 C)  TempSrc:  Oral Oral Oral  SpO2: 100% 98% 99% 98%  Weight:      Height:       General: Calm, NAD Respiratory: Bilateral lung sounds are clear to auscultation. Respiratory effort normal.  Room air Cardiovascular: S1 S2, regular pulse. TED hose for recurrent orthostasis are in place. Gastrointestinal: Abdomen is nondistended, soft and nontender. Normal bowel sounds heard. LBM 2/23 Neurological: Cranial nerves II through XII are grossly intact, patient able to move all extremities x4 with strength 5/5.  Sensation is intact Skin: No rashes, lesions or ulcers.  Psychiatry: Judgement and insight intact.  Mood & affect flat  Data Reviewed: There are no new results to review at this time.   DVT Prophylaxis   ., Place ted hose  Place ted hose    Family Communication:  Patient only-- use a foreign language translator  Disposition: Remains inpatient appropriate because: Unsafe discharge plan-deconditioned  with ambulation concerns primarily related to recurrent orthostasis-also uncontrolled diabetes at presentation; severe depression along with language barrier contributing to patient's ability for self-care-care be recommends short-term SNF for rehabilitation; also patient does not have a way to pay for SNF or rehab Please review any new documentation from  CM/LCSW   A physical therapy consult is indicated based on the patients mobility assessment.   Mobility Assessment (most recent)     Mobility Assessment - 05/26/21 2104     Does patient have an order for bedrest or is patient medically unstable No - Continue assessment  What is the highest level of mobility based on the progressive mobility assessment? Level 6 (Walks independently in room and hall) - Balance while walking in room without assist - Complete    Is the above level different from baseline mobility prior to current illness? No - Consider discontinuing PT/OT                 Planned Discharge Destination:  Family care home/Rest home or ALF   COVID vaccination status:  Unknown  Consultants: Psychiatry Gastroenterology Procedures: Echocardiogram Antibiotics: None    Time spent: 5 minutes  Author: Erin Hearing, NP 05/27/2021 12:54 PM  For on call review [email or web address].

## 2021-05-28 LAB — CBC WITH DIFFERENTIAL/PLATELET
Abs Immature Granulocytes: 0.02 10*3/uL (ref 0.00–0.07)
Basophils Absolute: 0 10*3/uL (ref 0.0–0.1)
Basophils Relative: 0 %
Eosinophils Absolute: 0.3 10*3/uL (ref 0.0–0.5)
Eosinophils Relative: 4 %
HCT: 36.1 % — ABNORMAL LOW (ref 39.0–52.0)
Hemoglobin: 12.1 g/dL — ABNORMAL LOW (ref 13.0–17.0)
Immature Granulocytes: 0 %
Lymphocytes Relative: 46 %
Lymphs Abs: 3.3 10*3/uL (ref 0.7–4.0)
MCH: 30.1 pg (ref 26.0–34.0)
MCHC: 33.5 g/dL (ref 30.0–36.0)
MCV: 89.8 fL (ref 80.0–100.0)
Monocytes Absolute: 0.7 10*3/uL (ref 0.1–1.0)
Monocytes Relative: 9 %
Neutro Abs: 3 10*3/uL (ref 1.7–7.7)
Neutrophils Relative %: 41 %
Platelets: 141 10*3/uL — ABNORMAL LOW (ref 150–400)
RBC: 4.02 MIL/uL — ABNORMAL LOW (ref 4.22–5.81)
RDW: 12.6 % (ref 11.5–15.5)
WBC: 7.3 10*3/uL (ref 4.0–10.5)
nRBC: 0 % (ref 0.0–0.2)

## 2021-05-28 LAB — COMPREHENSIVE METABOLIC PANEL
ALT: 158 U/L — ABNORMAL HIGH (ref 0–44)
AST: 67 U/L — ABNORMAL HIGH (ref 15–41)
Albumin: 3.4 g/dL — ABNORMAL LOW (ref 3.5–5.0)
Alkaline Phosphatase: 68 U/L (ref 38–126)
Anion gap: 8 (ref 5–15)
BUN: 16 mg/dL (ref 6–20)
CO2: 27 mmol/L (ref 22–32)
Calcium: 9.3 mg/dL (ref 8.9–10.3)
Chloride: 101 mmol/L (ref 98–111)
Creatinine, Ser: 0.7 mg/dL (ref 0.61–1.24)
GFR, Estimated: >60 mL/min (ref >60)
Glucose, Bld: 193 mg/dL — ABNORMAL HIGH (ref 70–99)
Potassium: 4.2 mmol/L (ref 3.5–5.1)
Sodium: 136 mmol/L (ref 135–145)
Total Bilirubin: 0.3 mg/dL (ref 0.3–1.2)
Total Protein: 7.6 g/dL (ref 6.5–8.1)

## 2021-05-28 LAB — GLUCOSE, CAPILLARY
Glucose-Capillary: 186 mg/dL — ABNORMAL HIGH (ref 70–99)
Glucose-Capillary: 224 mg/dL — ABNORMAL HIGH (ref 70–99)
Glucose-Capillary: 246 mg/dL — ABNORMAL HIGH (ref 70–99)
Glucose-Capillary: 208 mg/dL — ABNORMAL HIGH (ref 70–99)

## 2021-05-28 NOTE — Progress Notes (Signed)
Progress Note   PatientHewitt [name] [id number] DOB: [birth year] DOA: 03/04/2021     23 DOS: the patient was seen and examined on 05/28/2021 Needs interpreter/speaks [name]   Brief hospital course: Garrett [name] is a 36 y.o. male with medical history significant of severe depression with psychosis, T2DM and orthostatic hypotension who had been holding in ED for about 2 months time waiting on placement. He was being cared for by members in the community, but they have returned home and he no longer has care. He has been boarding in ED with some hypoglycemic episodes, diarrhea (resolved after metformin adjusted) and some orthostatic hypotension that continues to persist despite stopping his antihypertensive medication as well as addition of SSRI/mood stabilizing drugs.  While in the ER he also had an isolated episode of hyperglycemia with serum glucose 503 with a normal anion gap.  Not consistent with DKA.  Labs were checked and he was noted to have significantly elevated LFTs and with concomitant orthostasis he was referred for admission.  Currently, social work continues to work on placement.  He continues to have significant orthostasis that is limiting his ability to ambulate.     Assessment and Plan: Low back pain Controlled 2/22 have ordered low-dose Robaxin BID x3 days then will transition back to Robaxin 500 TID prn Continue to encourage increased mobility especially outside of room to minimize back pain  Chronic Hepatitis B Overall LFTs trending down Appreciate GI assistance Current suspicion is for chronic Hep B with worsening of LFTs in the setting of ischemic injury during orthostasis vs. Drug induced liver injury (risperdal) He will eventually need referral to infectious disease for management of chronic hep B Checking LFTs every other day-total bilirubin normal with AST in the 200s and ALTs in the 500 AFP tumor marker slightly elevated at 10 and likely more reflective of acute  exacerbation and hepatitis process since CT abdomen without any solid liver masses GI recommends outpatient follow-up at [facility] hepatology clinic versus with ID here in [place].  A liver biopsy should be considered. -Although may be a candidate for hepatitis B treatment would need to be initiated in the outpatient setting with subsequent insurance authorization to avoid interruption in treatment.  Also has severe depression which may influence decision to initiate hepatitis B medical therapy.   Acute urinary retention- (present on admission) Recurrent incomplete emptying/inability ti void but typucally < 500cc I/O cath prn Flomax dc'd 2/2 orthostasis As of 2/9 patient was able to tell that he needed to void and was able to void successfully as of 2/10 and is continuing to void without difficulty  Thrombocytopenia (HCC) Mild and intermittent. Follow CBCs periodically. Etiology unclear but remain greater than 100,000. Would avoid Depakote in this patient  Physical deconditioning- (present on admission) PT initially recommended SNF but patient mobility has significantly improved although he still has some episodes of dizziness.  PT has subsequently signed off  Anemia Iron panel: Ferritin 696.  B12 in December 2021: 750 and folate 16.5. Hemoglobin stable in the 11 g range.  Outpatient follow-up  Orthostatic hypotension likely 2/2 autonomic dysfunction- (present on admission) Has had episodic orthostasis while in ED during the past 2 months and received intermittent IV fluids TSH normal, cortisol checked on 04/14/2021 low at 3.1 but cosyntropin stim test on 2/6 baseline cortisol 11 which ruled out adrenal insufficiency -Continue to hold Flomax -continue TED hose -Discussed with psychiatry, it was felt unlikely that antipsychotics were causing orthostasis. -Due to supine hypertension Florinef discontinued -Midodrine  dose increased on 2/14 -Most recent orthostatic vital signs stable with  standing SBP in the 90s  Elevated LFTs See Hepatitis B notation  Controlled type 2 diabetes mellitus without complication, without long-term current use of insulin (HCC) Hga1c of 5.9 04/2021 HOME MEDS: Lantus 40 units daily and Metformin XR 500 mg daily Tightly controlled in setting of hypoglycemic events  Stopped metformin 2/2 diarrhea.   Continue Tradjenta  CBGs slightly over 200 since pt has increased PO intake (always hungry). 2/24: Increase Glucotrol to 10 mg daily and add Glucerna shakes TID Encouraged to eat protein such as peanut butter with crackers  MDD (major depressive disorder), recurrent, severe, with psychosis ([place])- (present on admission) Appreciate psychiatry assistance Continue lexapro  Risperidone discontinued 2/2 elevated LFTs and Invega initiated by the psychiatric team Continue cogentin Continues to have extremely flat affect and language barrier makes it difficult to effectively allow patient to express feelings and needs.  There may be some cultural issues that are also preventing patient from expressing his emotions Patient is severely socially isolated noting he has no visitors and only receives communication in his native language during short visits by staff into the room.  I have reached out to multiple care team members to see if they can assist in decreasing his social isolation.  Through the translator I let the patient know that if he watches [place] and other educational programs for children that should help him start learning English.  I have also explored the possibility of finding native language literature and hopefully some DVD/VHS movies that the patient can watch Tempted to get the patient to open up about his feelings and concerns but again limited 1-2 word responses through the translator.  Of note patient was not participating in any type of organized religion prior to admission so unable to reach out to culturally appropriate pastoral care at  this time.  I specifically asked if he was Buddhist, Muslim or Darrick Meigs and he stated no       Subjective:  Using translator updated patient on need to increase Glucotrol pill due to increased intake.  Also encouraged to eat more protein as opposed to carbohydrates and informed him I was adding a diabetic appropriate protein shake.  Patient replied understanding through translator.  05/28/21: Patient was seen at his bedside.  Has no new complaints.  Physical Exam: Vitals:   05/27/21 1516 05/27/21 2206 05/28/21 0432 05/28/21 0749  BP: 105/74 (!) 134/91 113/73 112/75  Pulse: (!) 103 96 64 93  Resp: 15 16 17 16   Temp: 97.9 F (36.6 C) 98.9 F (37.2 C) 98.2 F (36.8 C) 98.1 F (36.7 C)  TempSrc: Oral Oral Oral Oral  SpO2: 100% 98% (!) 87% 98%  Weight:      Height:       General: Calm, NAD Respiratory: Bilateral lung sounds are clear to auscultation. Respiratory effort normal.  Room air Cardiovascular: S1 S2, regular pulse. TED hose for recurrent orthostasis are in place. Gastrointestinal: Abdomen is nondistended, soft and nontender. Normal bowel sounds heard. LBM 2/23 Neurological: Cranial nerves II through XII are grossly intact, patient able to move all extremities x4 with strength 5/5.  Sensation is intact Skin: No rashes, lesions or ulcers.  Psychiatry: Judgement and insight intact.  Mood & affect flat  Data Reviewed: There are no new results to review at this time.   DVT Prophylaxis   ., Place ted hose  Place ted hose    Family Communication:  Patient only--  use a foreign Ecologist  Disposition: Remains inpatient appropriate because: Unsafe discharge plan-deconditioned with ambulation concerns primarily related to recurrent orthostasis-also uncontrolled diabetes at presentation; severe depression along with language barrier contributing to patient's ability for self-care-care be recommends short-term SNF for rehabilitation; also patient does not have a way  to pay for SNF or rehab Please review any new documentation from CM/LCSW   A physical therapy consult is indicated based on the patients mobility assessment.   Mobility Assessment (most recent)     Mobility Assessment - 05/26/21 2104     Does patient have an order for bedrest or is patient medically unstable No - Continue assessment  What is the highest level of mobility based on the progressive mobility assessment? Level 6 (Walks independently in room and Garrett [name]) - Balance while walking in room without assist - Complete    Is the above level different from baseline mobility prior to current illness? No - Consider discontinuing PT/OT                 Planned Discharge Destination:  Family care home/Rest home or ALF   COVID vaccination status:  Unknown  Consultants: Psychiatry Gastroenterology Procedures: Echocardiogram Antibiotics: None    Time spent: 5 minutes  Author: Kayleen Memos, DO 05/28/2021 11:35 AM  For on call review [email or web address].

## 2021-05-29 LAB — GLUCOSE, CAPILLARY
Glucose-Capillary: 193 mg/dL — ABNORMAL HIGH (ref 70–99)
Glucose-Capillary: 240 mg/dL — ABNORMAL HIGH (ref 70–99)
Glucose-Capillary: 336 mg/dL — ABNORMAL HIGH (ref 70–99)
Glucose-Capillary: 288 mg/dL — ABNORMAL HIGH (ref 70–99)

## 2021-05-29 NOTE — Progress Notes (Signed)
Progress Note   PatientWensley [name] [id number] DOB: [birth year] DOA: 03/04/2021     24 DOS: the patient was seen and examined on 05/29/2021 Needs interpreter/speaks Santiago Garrett [name]   Brief hospital course: Gaylor Rawlings is a 36 y.o. male with medical history significant of severe depression with psychosis, T2DM and orthostatic hypotension who had been holding in ED for about 2 months time waiting on placement. He was being cared for by members in the community, but they have returned home and he no longer has care. He has been boarding in ED with some hypoglycemic episodes, diarrhea (resolved after metformin adjusted) and some orthostatic hypotension that continues to persist despite stopping his antihypertensive medication as well as addition of SSRI/mood stabilizing drugs.  While in the ER he also had an isolated episode of hyperglycemia with serum glucose 503 with a normal anion gap.  Not consistent with DKA.  Labs were checked and he was noted to have significantly elevated LFTs and with concomitant orthostasis he was referred for admission.  Currently, social work continues to work on placement.  He continues to have significant orthostasis that is limiting his ability to ambulate.     Assessment and Plan: Low back pain Controlled 2/22 have ordered low-dose Robaxin BID x3 days then will transition back to Robaxin 500 TID prn Continue to encourage increased mobility especially outside of room to minimize back pain  Chronic Hepatitis B Overall LFTs trending down Appreciate GI assistance Current suspicion is for chronic Hep B with worsening of LFTs in the setting of ischemic injury during orthostasis vs. Drug induced liver injury (risperdal) He will eventually need referral to infectious disease for management of chronic hep B Checking LFTs every other day-total bilirubin normal with AST in the 200s and ALTs in the 500 AFP tumor marker slightly elevated at 10 and likely more reflective of acute  exacerbation and hepatitis process since CT abdomen without any solid liver masses GI recommends outpatient follow-up at [facility] hepatology clinic versus with ID here in [place].  A liver biopsy should be considered. -Although may be a candidate for hepatitis B treatment would need to be initiated in the outpatient setting with subsequent insurance authorization to avoid interruption in treatment.  Also has severe depression which may influence decision to initiate hepatitis B medical therapy.   Acute urinary retention- (present on admission) Recurrent incomplete emptying/inability ti void but typucally < 500cc I/O cath prn Flomax dc'd 2/2 orthostasis As of 2/9 patient was able to tell that he needed to void and was able to void successfully as of 2/10 and is continuing to void without difficulty  Thrombocytopenia (HCC) Mild and intermittent. Follow CBCs periodically. Etiology unclear but remain greater than 100,000. Would avoid Depakote in this patient  Physical deconditioning- (present on admission) PT initially recommended SNF but patient mobility has significantly improved although he still has some episodes of dizziness.  PT has subsequently signed off  Anemia Iron panel: Ferritin 696.  B12 in December 2021: 750 and folate 16.5. Hemoglobin stable in the 11 g range.  Outpatient follow-up  Orthostatic hypotension likely 2/2 autonomic dysfunction- (present on admission) Has had episodic orthostasis while in ED during the past 2 months and received intermittent IV fluids TSH normal, cortisol checked on 04/14/2021 low at 3.1 but cosyntropin stim test on 2/6 baseline cortisol 11 which ruled out adrenal insufficiency -Continue to hold Flomax -continue TED hose -Discussed with psychiatry, it was felt unlikely that antipsychotics were causing orthostasis. -Due to supine hypertension Florinef discontinued -Midodrine  dose increased on 2/14 -Most recent orthostatic vital signs stable with  standing SBP in the 90s  Elevated LFTs See Hepatitis B notation  Controlled type 2 diabetes mellitus without complication, without long-term current use of insulin (HCC) Hga1c of 5.9 04/2021 HOME MEDS: Lantus 40 units daily and Metformin XR 500 mg daily Tightly controlled in setting of hypoglycemic events  Stopped metformin 2/2 diarrhea.   Continue Tradjenta  CBGs slightly over 200 since pt has increased PO intake (always hungry). 2/24: Increase Glucotrol to 10 mg daily and add Glucerna shakes TID Encouraged to eat protein such as peanut butter with crackers  MDD (major depressive disorder), recurrent, severe, with psychosis ([place])- (present on admission) Appreciate psychiatry assistance Continue lexapro  Risperidone discontinued 2/2 elevated LFTs and Invega initiated by the psychiatric team Continue cogentin Continues to have extremely flat affect and language barrier makes it difficult to effectively allow patient to express feelings and needs.  There may be some cultural issues that are also preventing patient from expressing his emotions Patient is severely socially isolated noting he has no visitors and only receives communication in his native language during short visits by staff into the room.  I have reached out to multiple care team members to see if they can assist in decreasing his social isolation.  Through the translator I let the patient know that if he watches [place] and other educational programs for children that should help him start learning English.  I have also explored the possibility of finding native language literature and hopefully some DVD/VHS movies that the patient can watch Tempted to get the patient to open up about his feelings and concerns but again limited 1-2 word responses through the translator.  Of note patient was not participating in any type of organized religion prior to admission so unable to reach out to culturally appropriate pastoral care at  this time.  I specifically asked if he was Buddhist, Muslim or Darrick Meigs and he stated no       Subjective:  05/29/2021: Patient was seen at his bedside.  There were no acute events overnight.  He has no complaints.  05/28/21: Patient was seen at his bedside.  Has no new complaints.  Physical Exam: Vitals:   05/28/21 1615 05/28/21 1955 05/29/21 0534 05/29/21 0734  BP: 117/90 (!) 131/95 104/74 109/79  Pulse: 92 87 96 89  Resp: 16 16 17 18   Temp: 98.1 F (36.7 C)  (!) 97.5 F (36.4 C) 97.8 F (36.6 C)  TempSrc: Oral  Oral Oral  SpO2: 100% 100% 100% 100%  Weight:      Height:       General: Calm, NAD Respiratory: Bilateral lung sounds are clear to auscultation. Respiratory effort normal.  Room air Cardiovascular: S1 S2, regular pulse. TED hose for recurrent orthostasis are in place. Gastrointestinal: Abdomen is nondistended, soft and nontender. Normal bowel sounds heard. LBM 2/23 Neurological: Cranial nerves II through XII are grossly intact, patient able to move all extremities x4 with strength 5/5.  Sensation is intact Skin: No rashes, lesions or ulcers.  Psychiatry: Judgement and insight intact.  Mood & affect flat  Data Reviewed: There are no new results to review at this time.   DVT Prophylaxis   ., Place ted hose  Place ted hose    Family Communication:  Patient only-- use a foreign Ecologist  Disposition: Remains inpatient appropriate because: Unsafe discharge plan-deconditioned with ambulation concerns primarily related to recurrent orthostasis-also uncontrolled diabetes at presentation; severe  depression along with language barrier contributing to patient's ability for self-care-care be recommends short-term SNF for rehabilitation; also patient does not have a way to pay for SNF or rehab Please review any new documentation from CM/LCSW   A physical therapy consult is indicated based on the patients mobility assessment.   Mobility Assessment (most  recent)     Mobility Assessment - 05/29/21 0800     Does patient have an order for bedrest or is patient medically unstable No - Continue assessment  What is the highest level of mobility based on the progressive mobility assessment? Level 6 (Walks independently in room and [place]on Davidson) - Balance while walking in room without assist - Complete    Is the above level different from baseline mobility prior to current illness? No - Consider discontinuing PT/OT                 Planned Discharge Destination:  Family care home/Rest home or ALF   COVID vaccination status:  Unknown  Consultants: Psychiatry Gastroenterology Procedures: Echocardiogram Antibiotics: None    Time spent: 5 minutes  Author: Kayleen Memos, DO 05/29/2021 2:42 PM  For on call review [email or web address].

## 2021-05-29 NOTE — Progress Notes (Signed)
Mobility Specialist Progress Note    05/29/21 1017  Mobility  Activity Ambulated independently in hallway  Level of Assistance Standby assist, set-up cues, supervision of patient - no hands on  Assistive Device None  Distance Ambulated (ft) 550 ft  Activity Response Tolerated fair  $Mobility charge 1 Mobility   Pt received in BR and agreeable. Had x2 LOB but recovered himself. No complaints. Left sitting in bed with call bell in reach.   [facility] Mobility Specialist  M.S. 5N: [phone]

## 2021-05-30 DIAGNOSIS — Z794 Long term (current) use of insulin: Secondary | ICD-10-CM

## 2021-05-30 DIAGNOSIS — R531 Weakness: Principal | ICD-10-CM | POA: Insufficient documentation

## 2021-05-30 LAB — GLUCOSE, CAPILLARY
Glucose-Capillary: 176 mg/dL — ABNORMAL HIGH (ref 70–99)
Glucose-Capillary: 212 mg/dL — ABNORMAL HIGH (ref 70–99)
Glucose-Capillary: 164 mg/dL — ABNORMAL HIGH (ref 70–99)
Glucose-Capillary: 234 mg/dL — ABNORMAL HIGH (ref 70–99)

## 2021-05-30 MED ORDER — INSULIN GLARGINE-YFGN 100 UNIT/ML ~~LOC~~ SOLN
10.0000 [IU] | Freq: Every day | SUBCUTANEOUS | Status: DC
  Administered 2021-05-30 – 2021-06-01 (×3): 10 [IU] via SUBCUTANEOUS
  Filled 2021-05-30 (×3): qty 0.1

## 2021-05-30 NOTE — Progress Notes (Signed)
Progress Note   [name] [id number] DOB: 1985/06/28 DOA: 03/04/2021     25 DOS: the patient was seen and examined on 05/30/2021 Needs interpreter/speaks [name]   Brief hospital course: [name] is a 36 y.o. male with medical history significant of severe depression with psychosis, T2DM and orthostatic hypotension who had been holding in ED for about 2 months time waiting on placement. He was being cared for by members in the community, but they have returned home and he no longer has care. He has been boarding in ED with some hypoglycemic episodes, diarrhea (resolved after metformin adjusted) and some orthostatic hypotension that continues to persist despite stopping his antihypertensive medication as well as addition of SSRI/mood stabilizing drugs.  While in the ER he also had an isolated episode of hyperglycemia with serum glucose 503 with a normal anion gap.  Not consistent with DKA.  Labs were checked and he was noted to have significantly elevated LFTs and with concomitant orthostasis he was referred for admission.  Currently, social work continues to work on placement.  He continues to have significant orthostasis that is limiting his ability to ambulate.     Assessment and Plan: Controlled type 2 diabetes mellitus without complication, with long-term current use of insulin (HCC) Hga1c of 5.9 04/2021 HOME MEDS: Lantus 40 units daily and Metformin XR 500 mg daily Tightly controlled in setting of hypoglycemic events  Stopped metformin 2/2 diarrhea.   Continue Tradjenta and Glucotrol CBGs remain greater than 200 consistently so we will resume Semglee insulin low-dose  Chronic Hepatitis B Overall LFTs trending down Appreciate GI assistance Current suspicion is for chronic Hep B with worsening of LFTs in the setting of ischemic injury during orthostasis vs. Drug induced liver injury (risperdal) He will eventually need referral to infectious disease for management of chronic hep  B Checking LFTs every other day-total bilirubin normal with AST in the 200s and ALTs in the 500 AFP tumor marker slightly elevated at 10 and likely more reflective of acute exacerbation and hepatitis process since CT abdomen without any solid liver masses GI recommends outpatient follow-up at [facility] hepatology clinic versus with ID here in [place].  A liver biopsy should be considered. -Although may be a candidate for hepatitis B treatment would need to be initiated in the outpatient setting with subsequent insurance authorization to avoid interruption in treatment.  Also has severe depression which may influence decision to initiate hepatitis B medical therapy.   Orthostatic hypotension likely 2/2 autonomic dysfunction- (present on admission) Has had episodic orthostasis while in ED during the past 2 months and received intermittent IV fluids TSH normal, cortisol checked on 04/14/2021 low at 3.1 but cosyntropin stim test on 2/6 baseline cortisol 11 which ruled out adrenal insufficiency -Continue to hold Flomax -continue TED hose -Discussed with psychiatry, it was felt unlikely that antipsychotics were causing orthostasis. -Due to supine hypertension Florinef discontinued -Midodrine dose increased on 2/14 -Most recent orthostatic vital signs stable with standing SBP in the 90s  MDD (major depressive disorder), recurrent, severe, with psychosis ([place])- (present on admission) Appreciate psychiatry assistance Continue lexapro  Risperidone discontinued 2/2 elevated LFTs and Invega initiated by the psychiatric team Continue cogentin Continues to have extremely flat affect and language barrier makes it difficult to effectively allow patient to express feelings and needs.  There may be some cultural issues that are also preventing patient from expressing his emotions Patient is severely socially isolated noting he has no visitors and only receives communication in his  native language during short  visits by staff into the room.  I have reached out to multiple care team members to see if they can assist in decreasing his social isolation.  Through the translator I let the patient know that if he watches [place] and other educational programs for children that should help him start learning English.  I have also explored the possibility of finding native language literature and hopefully some DVD/VHS movies that the patient can watch Tempted to get the patient to open up about his feelings and concerns but again limited 1-2 word responses through the translator.  Of note patient was not participating in any type of organized religion prior to admission so unable to reach out to culturally appropriate pastoral care at this time.  I specifically asked if he was Buddhist, Muslim or Darrick Meigs and he stated no   Acute urinary retention- (present on admission) Recurrent incomplete emptying/inability ti void but typucally < 500cc I/O cath prn Flomax dc'd 2/2 orthostasis As of 2/9 patient was able to tell that he needed to void and was able to void successfully as of 2/10 and is continuing to void without difficulty  Anemia Iron panel: Ferritin 696.  B12 in December 2021: 750 and folate 16.5. Hemoglobin stable in the 11 g range.  Outpatient follow-up  Thrombocytopenia (HCC) Mild and intermittent. Follow CBCs periodically. Etiology unclear but remain greater than 100,000. Would avoid Depakote in this patient  Physical deconditioning- (present on admission) PT initially recommended SNF but patient mobility has significantly improved although he still has some episodes of dizziness.  PT has subsequently signed off  Low back pain Controlled 2/22 have ordered low-dose Robaxin BID x3 days then will transition back to Robaxin 500 TID prn Continue to encourage increased mobility especially outside of room to minimize back pain  Elevated LFTs See Hepatitis B notation      Subjective:   Alert.  No complaints verbalized.  Physical Exam: Vitals:   05/29/21 1526 05/29/21 2038 05/30/21 0402 05/30/21 0905  BP: 121/80 122/79 102/72 101/76  Pulse: 94 96 97 (!) 108  Resp: 15 16 17 18   Temp: 98.1 F (36.7 C) 98.4 F (36.9 C) 98.7 F (37.1 C) (!) 97.5 F (36.4 C)  TempSrc: Oral  Oral Oral  SpO2: 100% 97% 99% 97%  Weight:      Height:       General: Calm, NAD Respiratory: Bilateral lung sounds are clear to auscultation. Respiratory effort normal.  Room air Cardiovascular: S1 S2, regular pulse. TED hose for recurrent orthostasis are in place. Gastrointestinal: Abdomen is nondistended, soft and nontender. Normal bowel sounds heard. LBM 2/24 Neurological: Cranial nerves II through XII are grossly intact, patient able to move all extremities x4 with strength 5/5.  Sensation is intact Skin: No rashes, lesions or ulcers.  Psychiatry: Judgement and insight intact.  Mood & affect flat  Data Reviewed: There are no new results to review at this time.   DVT Prophylaxis   ., Place ted hose  Place ted hose    Family Communication:  Patient only-- use a foreign Ecologist  Disposition: Remains inpatient appropriate because: Unsafe discharge plan-deconditioned with ambulation concerns primarily related to recurrent orthostasis-also uncontrolled diabetes at presentation; severe depression along with language barrier contributing to patient's ability for self-care-care be recommends short-term SNF for rehabilitation; also patient does not have a way to pay for SNF or rehab Please review any new documentation from CM/LCSW   A physical therapy consult is  indicated based on the patients mobility assessment.   Mobility Assessment (last 72 hours)     Mobility Assessment     Row Name 05/29/21 1925 05/29/21 0800 05/28/21 1955 05/28/21 0800     Does patient have an order for bedrest or is patient medically unstable No - Continue assessment No - Continue assessment No -  Continue assessment No - Continue assessment    What is the highest level of mobility based on the progressive mobility assessment? Level 6 (Walks independently in room and hall) - Balance while walking in room without assist - Complete Level 6 (Walks independently in room and hall) - Balance while walking in room without assist - Complete Level 6 (Walks independently in room and hall) - Balance while walking in room without assist - Complete Level 6 (Walks independently in room and hall) - Balance while walking in room without assist - Complete    Is the above level different from baseline mobility prior to current illness? No - Consider discontinuing PT/OT No - Consider discontinuing PT/OT No - Consider discontinuing PT/OT No - Consider discontinuing PT/OT               Planned Discharge Destination:  Family care home/Rest home or ALF   COVID vaccination status:  Unknown  Consultants: Psychiatry Gastroenterology Procedures: Echocardiogram Antibiotics: None    Time spent: 5 minutes  Author: Erin Hearing, NP 05/30/2021 1:00 PM  For on call review [email or web address].

## 2021-05-30 NOTE — Progress Notes (Addendum)
PT Cancellation Note  Patient Details Name: Sayge Salvato MRN: [id number] DOB: January 10, 1986   Cancelled Treatment:    Reason Eval/Treat Not Completed: Other (comment).  Pt was unable to be seen due to lack of translator today.  Retry at another time.   Ivar Drape 05/30/2021, 1:10 PM  Samul Dada, PT PhD Acute Rehab Dept. Number: [facility] [id number] and [facility] [phone]

## 2021-05-30 NOTE — Progress Notes (Signed)
Inpatient Diabetes Program Recommendations  AACE/ADA: New Consensus Statement on Inpatient Glycemic Control (2015)  Target Ranges:  Prepandial:   less than 140 mg/dL      Peak postprandial:   less than 180 mg/dL (1-2 hours)      Critically ill patients:  140 - 180 mg/dL   Lab Results  Component Value Date   GLUCAP 176 (H) 05/30/2021   HGBA1C 5.5 05/14/2021    Review of Glycemic Control  Latest Reference Range & Units 05/28/21 20:15 05/29/21 07:36 05/29/21 12:03 05/29/21 18:09 05/29/21 20:39 05/30/21 09:07  Glucose-Capillary 70 - 99 mg/dL 208 (H) 193 (H) 240 (H) 336 (H) 288 (H) 176 (H)   Diabetes history: DM  Outpatient Diabetes medications: Basaglar 40 units daily, Metformin 1000 mg bid Current orders for Inpatient glycemic control:  Glucotrol 10 mg daily Tradgenta 5 mg daily Novolog correction 0-6 units tid with meals  Inpatient Diabetes Program Recommendations:    Consider adding Semglee 10 units daily.   Thanks,  Adah Perl, RN, BC-ADM Inpatient Diabetes Coordinator Pager [phone] (8a-5p)

## 2021-05-30 NOTE — Progress Notes (Signed)
Mobility Specialist Progress Note    05/30/21 1645  Mobility  Activity Ambulated independently in hallway  Level of Assistance Independent  Assistive Device None  Distance Ambulated (ft) 550 ft  Activity Response Tolerated well  $Mobility charge 1 Mobility   Pt received in bed and agreeable. No complaints. Left in room with NT present for blood sugar.   [facility] Mobility Specialist  M.S. 5N: [phone]

## 2021-05-31 LAB — GLUCOSE, CAPILLARY
Glucose-Capillary: 182 mg/dL — ABNORMAL HIGH (ref 70–99)
Glucose-Capillary: 132 mg/dL — ABNORMAL HIGH (ref 70–99)
Glucose-Capillary: 231 mg/dL — ABNORMAL HIGH (ref 70–99)
Glucose-Capillary: 271 mg/dL — ABNORMAL HIGH (ref 70–99)

## 2021-05-31 NOTE — Progress Notes (Signed)
Mobility Specialist Progress Note:   05/31/21 1036  Mobility  Activity Ambulated with assistance in hallway  Level of Assistance Independent  Assistive Device None  Distance Ambulated (ft) 550 ft  Activity Response Tolerated well  $Mobility charge 1 Mobility   Pt received in bed willing to participate in mobility. Complaints of "a little" foot pain. Pt left in bed with call bell in reach and all needs met.   [facility] Public librarian Phone [phone]

## 2021-05-31 NOTE — Progress Notes (Signed)
Progress Note   PatientAybel [name] [id number] DOB: 25-Mar-1986 DOA: 03/04/2021     26 DOS: the patient was seen and examined on 05/31/2021 Needs interpreter/speaks Garrett [name]   Brief hospital course: Garrett [name] is a 36 y.o. male with medical history significant of severe depression with psychosis, T2DM and orthostatic hypotension who had been holding in ED for about 2 months time waiting on placement. He was being cared for by members in the community, but they have returned home and he no longer has care. He has been boarding in ED with some hypoglycemic episodes, diarrhea (resolved after metformin adjusted) and some orthostatic hypotension that continues to persist despite stopping his antihypertensive medication as well as addition of SSRI/mood stabilizing drugs.  While in the ER he also had an isolated episode of hyperglycemia with serum glucose 503 with a normal anion gap.  Not consistent with DKA.  Labs were checked and he was noted to have significantly elevated LFTs and with concomitant orthostasis he was referred for admission.  Currently, social work continues to work on placement.  He continues to have significant orthostasis that is limiting his ability to ambulate.     Assessment and Plan: Controlled type 2 diabetes mellitus without complication, with long-term current use of insulin (HCC) Hga1c of 5.9 04/2021 HOME MEDS: Lantus 40 units daily and Metformin XR 500 mg daily Tightly controlled in setting of hypoglycemic events  Stopped metformin 2/2 diarrhea.   Continue Tradjenta and Glucotrol CBGs much better controlled after reintroduction of Semglee insulin on 2/27  Chronic Hepatitis B Appreciate GI assistance-suspect chronic Hep B with worsening of LFTs in the setting of ischemic injury during orthostasis vs. Drug induced liver injury (risperdal) After DC needs referral to ID for management of chronic hep B AFP tumor marker slightly elevated at 10 c/w acute exacerbation and hepatitis  process -CT abdomen without any solid liver masses GI recommends outpatient follow-up at [facility] hepatology clinic versus with ID here in [place]. Liver biopsy should be considered. -Although may be a candidate for hepatitis B treatment would need to be initiated in the outpatient setting with subsequent insurance authorization to avoid interruption in treatment.  Also has severe depression which may influence decision to initiate hepatitis B medical therapy.   Orthostatic hypotension likely 2/2 autonomic dysfunction- (present on admission) Has had episodic orthostasis while in ED during the past 2 months and received intermittent IV fluids TSH normal, cortisol checked on 04/14/2021 low at 3.1 but cosyntropin stim test on 2/6 baseline cortisol 11 which ruled out adrenal insufficiency -continue TED hose -Cont Midodrine  MDD (major depressive disorder), recurrent, severe, with psychosis ([place])- (present on admission) Appreciate psychiatry assistance Continue lexapro and cogentin Risperidone discontinued 2/2 elevated LFTs and Invega initiated by the psychiatric team    Acute urinary retention- (present on admission) Resolved  Anemia Iron panel: Ferritin 696.  B12 in December 2021: 750 and folate 16.5. Hemoglobin stable in the 11 g range.  Outpatient follow-up  Thrombocytopenia (HCC) Platelets stable and > 100,000  Physical deconditioning- (present on admission) PT initially recommended SNF but patient mobility has significantly improved although he still has some episodes of dizziness.  PT has subsequently signed off  A physical therapy consult is indicated based on the patients mobility assessment.   Mobility Assessment (last 72 hours)     Mobility Assessment     Row Name 05/30/21 2000 05/29/21 1925 05/29/21 0800 05/28/21 1955     Does patient have an order for bedrest or is patient  medically unstable No - Continue assessment No - Continue assessment No - Continue assessment No  - Continue assessment    What is the highest level of mobility based on the progressive mobility assessment? Level 6 (Walks independently in room and hall) - Balance while walking in room without assist - Complete Level 6 (Walks independently in room and hall) - Balance while walking in room without assist - Complete Level 6 (Walks independently in room and hall) - Balance while walking in room without assist - Complete Level 6 (Walks independently in room and hall) - Balance while walking in room without assist - Complete    Is the above level different from baseline mobility prior to current illness? No - Consider discontinuing PT/OT No - Consider discontinuing PT/OT No - Consider discontinuing PT/OT No - Consider discontinuing PT/OT              Low back pain Controlled on prn Robaxin and Tylenol Continue to encourage increased mobility especially outside of room to minimize back pain  Elevated LFTs See Hepatitis B notation      Subjective:  Alert.  No complaints verbalized.  Physical Exam: Vitals:   05/30/21 1647 05/30/21 2022 05/31/21 0310 05/31/21 0831  BP: 114/81 114/81 103/70 102/77  Pulse: 94 88 96 (!) 101  Resp: 18 18 17 17   Temp: 97.6 F (36.4 C) 97.8 F (36.6 C) 98.3 F (36.8 C) 98 F (36.7 C)  TempSrc: Oral Oral Oral Oral  SpO2: 96% 99% 98% 92%  Weight:      Height:       General: Calm, NAD Respiratory: Bilateral lung sounds are clear to auscultation. Respiratory effort normal.  Room air Cardiovascular: S1 S2, regular pulse. TED hose for recurrent orthostasis are in place. Gastrointestinal: Abdomen is nondistended, soft and nontender. Normal bowel sounds heard. LBM 2/24 Neurological: Cranial nerves II through XII are grossly intact, patient able to move all extremities x4 with strength 5/5.  Sensation is intact Skin: No rashes, lesions or ulcers.  Psychiatry: Judgement and insight intact.  Mood & affect flat  Data Reviewed: There are no new results to  review at this time.   DVT Prophylaxis   ., Place ted hose  Place ted hose    Family Communication:  Patient only-- use a foreign Ecologist  Disposition: Remains inpatient appropriate because: Unsafe discharge plan-deconditioned with ambulation concerns primarily related to recurrent orthostasis-also uncontrolled diabetes at presentation; severe depression along with language barrier contributing to patient's ability for self-care-care be recommends short-term SNF for rehabilitation; also patient does not have a way to pay for SNF or rehab Please review any new documentation from CM/LCSW     Planned Discharge Destination:  Family care home/Rest home or ALF   COVID vaccination status:  Unknown  Consultants: Psychiatry Gastroenterology Procedures: Echocardiogram Antibiotics: None    Time spent: 5 minutes  Author: Erin Hearing, NP 05/31/2021 11:39 AM  For on call review [email or web address].

## 2021-05-31 NOTE — Progress Notes (Signed)
PT Cancellation Note  Patient Details Name: Garrett [name] MRN: [id number] DOB: Oct 27, 1985   Cancelled Treatment:    Reason Eval/Treat Not Completed: Fatigue/lethargy limiting ability to participate.  Lengthy walk with MT and declines any further PT for now.  Follow up at another time.   Ivar Drape 05/31/2021, 4:00 PM  Samul Dada, PT PhD Acute Rehab Dept. Number: [facility] [id number] and [facility] [phone]

## 2021-06-01 LAB — GLUCOSE, CAPILLARY
Glucose-Capillary: 205 mg/dL — ABNORMAL HIGH (ref 70–99)
Glucose-Capillary: 228 mg/dL — ABNORMAL HIGH (ref 70–99)
Glucose-Capillary: 191 mg/dL — ABNORMAL HIGH (ref 70–99)
Glucose-Capillary: 162 mg/dL — ABNORMAL HIGH (ref 70–99)

## 2021-06-01 MED ORDER — INSULIN GLARGINE-YFGN 100 UNIT/ML ~~LOC~~ SOLN
20.0000 [IU] | Freq: Every day | SUBCUTANEOUS | Status: DC
  Administered 2021-06-02: 20 [IU] via SUBCUTANEOUS
  Filled 2021-06-01 (×2): qty 0.2

## 2021-06-01 MED ORDER — INSULIN GLARGINE-YFGN 100 UNIT/ML ~~LOC~~ SOLN
10.0000 [IU] | Freq: Once | SUBCUTANEOUS | Status: AC
  Administered 2021-06-01: 10 [IU] via SUBCUTANEOUS
  Filled 2021-06-01: qty 0.1

## 2021-06-01 NOTE — Progress Notes (Signed)
Physical Therapy Treatment ?Patient Details ?Name: Garrett [name] ?MRN: [id number] ?DOB: [birth year] ?Today's Date: 06/01/2021 ? ? ?History of Present Illness Pt is a 36 y.o. male presenting to ED 03/04/21 with withdrawal from friends, lack of oral intake, possibly catatonic behavior; thought to be secondary to MDD. Per nsg pt sustained a fall with unsteady knees early on in his admission. Current issue of hypotension, symptomatic and admitted to floor on 05/05/21 with transaminitis and right sided abdominal pain. PMH includes DM, medication non-compliance, AMS, DKA, acute metabolic encephalopathy, MDD. ? ?  ?PT Comments  ? ? Pt was seen for consideration of need to continue therapy.  He is demonstrating very high level balance challenges specifically with narrow based skills, but with regular gait is quite able to manage.  Berg test was a 48/56, not indicating AD but is not doing anything especially challenging to balance during the day.  Possibly since he is limited in his mobility other than when he is encouraged to walk, there are issues that stem more from disuse.  Will recommend PT encourage him to walk more often, to get out of his room and to sit oob in a chair rather than on the bed.   ?Recommendations for follow up therapy are one component of a multi-disciplinary discharge planning process, led by the attending physician.  Recommendations may be updated based on patient status, additional functional criteria and insurance authorization. ? ?Follow Up Recommendations ? No PT follow up ?  ?  ?Assistance Recommended at Discharge PRN  ?Patient can return home with the following Assist for transportation;Assistance with cooking/housework;Direct supervision/assist for financial management;Direct supervision/assist for medications management ?  ?Equipment Recommendations ? None recommended by PT  ?  ?Recommendations for Other Services   ? ? ?  ?Precautions / Restrictions Precautions ?Precautions: None ?Precaution Comments:  not orthostatic during session today ?Restrictions ?Weight Bearing Restrictions: No  ?  ? ?Mobility ? Bed Mobility ?Overal bed mobility: Independent ?  ?  ?  ?  ?  ?  ?  ?  ? ?Transfers ?Overall transfer level: Independent ?  ?  ?  ?  ?  ?  ?  ?  ?  ?  ? ?Ambulation/Gait ?Ambulation/Gait assistance: Independent ?Gait Distance (Feet): 150 Feet ?Assistive device: None ?Gait Pattern/deviations: Step-through pattern, Narrow base of support ?Gait velocity: normal ?  ?Pre-gait activities: static balance check ?  ? ? ?Stairs ?  ?  ?  ?  ?  ? ? ?Wheelchair Mobility ?  ? ?Modified Rankin (Stroke Patients Only) ?  ? ? ?  ?Balance   ?  ?  ?  ?  ?Standing balance support: No upper extremity supported ?Standing balance-Leahy Scale: Good ?  ?  ?  ?  ?  ?  ?  ?  ?  ?Standardized Balance Assessment ?Standardized Balance Assessment : Berg Balance Test ?Sharlene Motts Balance Test ?Sit to Stand: Able to stand without using hands and stabilize independently ?Standing Unsupported: Able to stand safely 2 minutes ?Sitting with Back Unsupported but Feet Supported on Floor or Stool: Able to sit safely and securely 2 minutes ?Stand to Sit: Sits safely with minimal use of hands ?Transfers: Able to transfer safely, minor use of hands ?Standing Unsupported with Eyes Closed: Able to stand 10 seconds safely ?Standing Ubsupported with Feet Together: Able to place feet together independently and stand for 1 minute with supervision ?From Standing, Reach Forward with Outstretched Arm: Can reach confidently >25 cm (10") ?From Standing Position, Pick up Object from  Floor: Able to pick up shoe safely and easily ?From Standing Position, Turn to Look Behind Over each Shoulder: Looks behind from both sides and weight shifts well ?Turn 360 Degrees: Able to turn 360 degrees safely in 4 seconds or less ?Standing Unsupported, Alternately Place Feet on Step/Stool: Able to stand independently and complete 8 steps >20 seconds ?Standing Unsupported, One Foot in Front:  Loses balance while stepping or standing ?Standing on One Leg: Able to lift leg independently and hold equal to or more than 3 seconds ?Total Score: 48 ?  ?  ? ?  ?Cognition Arousal/Alertness: Awake/alert ?Behavior During Therapy: Flat affect ?Overall Cognitive Status: Difficult to assess ?  ?  ?  ?  ?  ?  ?  ?  ?  ?  ?  ?  ?  ?  ?  ?  ?General Comments: pt has minimal verbalizations to questions ?  ?  ? ?  ?Exercises   ? ?  ?General Comments General comments (skin integrity, edema, etc.): pt is demonstrating no LOB with regular gait bouts in the room or on the hallway.  However, he is unsteady to do narrow base balance challenges from [place] at higher level of skill ?  ?  ? ?Pertinent Vitals/Pain Pain Assessment ?Pain Assessment: No/denies pain ?Faces Pain Scale: No hurt ?Breathing: normal ?Negative Vocalization: none ?Facial Expression: smiling or inexpressive ?Body Language: relaxed ?Consolability: no need to console ?PAINAD Score: 0  ? ? ?Home Living   ?  ?  ?  ?  ?  ?  ?  ?  ?  ?   ?  ?Prior Function    ?  ?  ?   ? ?PT Goals (current goals can now be found in the care plan section) Acute Rehab PT Goals ?Patient Stated Goal: none reported ? ?  ?Frequency ? ? ? Min 1X/week ? ? ? ?  ?PT Plan Current plan remains appropriate  ? ? ?Co-evaluation   ?  ?  ?  ?  ? ?  ?AM-PAC PT "6 Clicks" Mobility   ?Outcome Measure ? Help needed turning from your back to your side while in a flat bed without using bedrails?: None ?Help needed moving from lying on your back to sitting on the side of a flat bed without using bedrails?: None ?Help needed moving to and from a bed to a chair (including a wheelchair)?: None ?Help needed standing up from a chair using your arms (e.g., wheelchair or bedside chair)?: None ?Help needed to walk in hospital room?: None ?Help needed climbing 3-5 steps with a railing? : A Little ?6 Click Score: 23 ? ?  ?End of Session Equipment Utilized During Treatment: Gait belt ?Activity Tolerance: Patient  tolerated treatment well ?Patient left: in bed;with call bell/phone within reach ?Nurse Communication: Mobility status ?PT Visit Diagnosis: Adult, failure to thrive (R62.7);Muscle weakness (generalized) (M62.81);History of falling (Z91.81);Other abnormalities of gait and mobility (R26.89) ?  ? ? ?Time: 8295-6213 ?PT Time Calculation (min) (ACUTE ONLY): 26 min ? ?Charges:  $Gait Training: 8-22 mins ?$Neuromuscular Re-education: 8-22 mins    ?Ivar Drape ?06/01/2021, 4:41 PM ? ?Samul Dada, PT PhD ?Acute Rehab Dept. Number: [facility] [phone] and [facility] [phone] ? ? ?

## 2021-06-01 NOTE — Progress Notes (Signed)
Mobility Specialist Progress Note: ? ? 06/01/21 1254  ?Mobility  ?Activity Ambulated with assistance in hallway  ?Level of Assistance Independent  ?Assistive Device None  ?Distance Ambulated (ft) 550 ft  ?Activity Response Tolerated well  ?$Mobility charge 1 Mobility  ? ?Pt received in bed willing to participate in mobility. Complaints of bilateral foot pain. Left in bed with call bell in reach and all needs met.  ? ?Garrett [name] ?Mobility Specialist ?Primary Phone [phone] ? ?

## 2021-06-01 NOTE — Progress Notes (Signed)
?Progress Note ? ? ?PatientBenaiah [name] CBU:[id number] DOB: 02-02-1986 DOA: 03/04/2021     27 ?DOS: the patient was seen and examined on 06/01/2021 ?Needs interpreter/speaks [name] ?  ?Brief hospital course: ?Garrett [name] is a 36 y.o. male with medical history significant of severe depression with psychosis, T2DM and orthostatic hypotension who had been holding in ED for about 2 months time waiting on placement. He was being cared for by members in the community, but they have returned home and he no longer has care. He has been boarding in ED with some hypoglycemic episodes, diarrhea (resolved after metformin adjusted) and some orthostatic hypotension that continues to persist despite stopping his antihypertensive medication as well as addition of SSRI/mood stabilizing drugs.  While in the ER he also had an isolated episode of hyperglycemia with serum glucose 503 with a normal anion gap.  Not consistent with DKA.  Labs were checked and he was noted to have significantly elevated LFTs and with concomitant orthostasis he was referred for admission.  Currently, social work continues to work on placement.  He continues to have significant orthostasis that is limiting his ability to ambulate. ?  ? ? ?Assessment and Plan: ?Controlled type 2 diabetes mellitus without complication, with long-term current use of insulin (HCC) ?Hga1c of 5.9 04/2021 ?HOME MEDS: Lantus 40 units daily and Metformin XR 500 mg daily ?Tightly controlled in setting of hypoglycemic events  ?Stopped metformin 2/2 diarrhea.   ?Continue Tradjenta and Glucotrol ?CBG still greater than 200 therefore will increase Semglee dosage from 10 to 20 units daily ? ?Chronic Hepatitis B ?Appreciate GI assistance-suspect chronic Hep B with worsening of LFTs in the setting of ischemic injury during orthostasis vs. Drug induced liver injury (risperdal) ?After DC needs referral to ID for management of chronic hep B ?AFP tumor marker slightly elevated at 10 c/w acute exacerbation  and hepatitis process -CT abdomen without any solid liver masses ?GI recommends outpatient [facility] at [facility] versus with ID here in [place]. Liver biopsy should be considered. ?-Although may be a candidate for hepatitis B treatment would need to be initiated in the outpatient setting with subsequent insurance authorization to avoid interruption in treatment.  Also has severe depression which may influence decision to initiate hepatitis B medical therapy. ? ? ?Orthostatic hypotension likely 2/2 autonomic dysfunction-resolved as of 06/01/2021, (present on admission) ?Has had episodic orthostasis while in ED during the past 2 months and received intermittent IV fluids ?TSH normal, cortisol checked on 04/14/2021 low at 3.1 but cosyntropin stim test on 2/6 baseline cortisol 11 which ruled out adrenal insufficiency ?-continue TED hose ?-Cont Midodrine ? ?MDD (major depressive disorder), recurrent, severe, with psychosis (HCC)- (present on admission) ?Appreciate psychiatry assistance ?Continue lexapro and cogentin ?Risperidone discontinued 2/2 elevated LFTs and Invega initiated by the psychiatric team ? ? ? ?Acute urinary retention-resolved as of 06/01/2021, (present on admission) ?Resolved ? ?Anemia ?Iron panel: Ferritin 696.  B12 in December 2021: 750 and folate 16.5. ?Hemoglobin stable in the 11 g range.  Outpatient follow-up ? ?Thrombocytopenia (HCC)-resolved as of 06/01/2021 ?Platelets stable and > 100,000 ? ?Physical deconditioning- (present on admission) ?PT initially recommended SNF but patient mobility has significantly improved although he still has some episodes of dizziness. ?Physical activity continues to be limited by fatigue. Pt spends most of his days sitting in the bed although able to toilet independently. Previously enc to walk outside of room (using translation svc) ? ?A physical therapy consult is indicated based on the patient?s mobility assessment. ? ? ?  Mobility Assessment (last 72  hours)   ? ? Mobility Assessment   ? ? Row Name 05/30/21 2000 05/29/21 1925 05/29/21 0800 05/28/21 1955  ?  ? Does patient have an order for bedrest or is patient medically unstable No - Continue assessment No - Continue assessment No - Continue assessment No - Continue assessment   ? What is the highest level of mobility based on the progressive mobility assessment? Level 6 (Walks independently in room and hall) - Balance while walking in room without assist - Complete Level 6 (Walks independently in room and hall) - Balance while walking in room without assist - Complete Level 6 (Walks independently in room and hall) - Balance while walking in room without assist - Complete Level 6 (Walks independently in room and hall) - Balance while walking in room without assist - Complete   ? Is the above level different from baseline mobility prior to current illness? No - Consider discontinuing PT/OT No - Consider discontinuing PT/OT No - Consider discontinuing PT/OT No - Consider discontinuing PT/OT   ? ?  ?  ? ?  ? ? ? ?Low back pain ?Controlled on prn Robaxin and Tylenol ?Continue to encourage increased mobility especially outside of room to minimize back pain ? ?Elevated LFTs ?See Hepatitis B notation ? ? ? ? ? ?Subjective:  ?Walking in room.  No specific complaints. ? ?Physical Exam: ?Vitals:  ? 05/31/21 1502 05/31/21 2116 06/01/21 0437 06/01/21 0733  ?BP: (!) 139/96 130/88 113/75 124/81  ?Pulse: 89 92 91 92  ?Resp: 17 16 18 16   ?Temp: (!) 97.3 ?F (36.3 ?C) 97.8 ?F (36.6 ?C) 98.2 ?F (36.8 ?C) 98.2 ?F (36.8 ?C)  ?TempSrc: Oral Oral Oral Oral  ?SpO2: 97% 98% 96% 98%  ?Weight:      ?Height:      ? ?General: Calm, NAD ?Respiratory: Bilateral lung sounds are clear to auscultation. Respiratory effort normal.  Room air ?Cardiovascular: S1 S2, regular pulse. TED hose for recurrent orthostasis are in place. ?Gastrointestinal: Abdomen is nondistended, soft and nontender. Normal bowel sounds heard. LBM 2/24 ?Neurological: Cranial  nerves II through XII are grossly intact, patient able to move all extremities x4 with strength 5/5.  Sensation is intact ?Skin: No rashes, lesions or ulcers.  ?Psychiatry: Judgement and insight intact.  Mood & affect flat ? ?Data Reviewed: ?There are no new results to review at this time. ? ? DVT Prophylaxis   ?., Place ted hose  ?Place ted hose  ? ? ?Family Communication:  ?Patient only-- use a foreign 3/24 ? ?Disposition: ?Remains inpatient appropriate because: Unsafe discharge plan-deconditioned with ambulation concerns primarily related to recurrent orthostasis-also uncontrolled diabetes at presentation; severe depression along with language barrier contributing to patient's ability for self-care-care be recommends short-term SNF for rehabilitation; also patient does not have a way to pay for SNF or rehab ?Please review any new documentation from CM/LCSW ? ? ? ? ?Planned Discharge Destination:  ?Family care home/Rest home or ALF ? ? ?COVID vaccination status:  ?Unknown ? ?Consultants: ?Psychiatry ?Gastroenterology ?Procedures: ?Echocardiogram ?Antibiotics: ?None ? ? ? ?Time spent: 15 minutes ? ?Author: ?Presenter, broadcasting, NP ?06/01/2021 11:59 AM ? ?For on call review [email or web address].  ? ?

## 2021-06-02 LAB — GLUCOSE, CAPILLARY
Glucose-Capillary: 113 mg/dL — ABNORMAL HIGH (ref 70–99)
Glucose-Capillary: 205 mg/dL — ABNORMAL HIGH (ref 70–99)
Glucose-Capillary: 122 mg/dL — ABNORMAL HIGH (ref 70–99)
Glucose-Capillary: 80 mg/dL (ref 70–99)
Glucose-Capillary: 128 mg/dL — ABNORMAL HIGH (ref 70–99)
Glucose-Capillary: 70 mg/dL (ref 70–99)

## 2021-06-02 LAB — SURGICAL PCR SCREEN
MRSA, PCR: NEGATIVE
Staphylococcus aureus: POSITIVE — AB

## 2021-06-02 MED ORDER — MUPIROCIN 2 % EX OINT
1.0000 "application " | TOPICAL_OINTMENT | Freq: Two times a day (BID) | CUTANEOUS | Status: AC
  Administered 2021-06-02 – 2021-06-06 (×10): 1 via NASAL
  Filled 2021-06-02 (×2): qty 22

## 2021-06-02 MED ORDER — CHLORHEXIDINE GLUCONATE CLOTH 2 % EX PADS
6.0000 | MEDICATED_PAD | Freq: Every day | CUTANEOUS | Status: AC
  Administered 2021-06-03 – 2021-06-06 (×4): 6 via TOPICAL

## 2021-06-02 NOTE — Progress Notes (Signed)
Mobility Specialist Progress Note: ? ? 06/02/21 1105  ?Mobility  ?Activity Ambulated with assistance in hallway  ?Level of Assistance Independent  ?Assistive Device None  ?Distance Ambulated (ft) 750 ft  ?Activity Response Tolerated well  ?$Mobility charge 1 Mobility  ? ?Pt received standing in room. Complaints of bilateral foot pain. Left in bed with call bell in reach and all needs met. ? ?Reiko Vinje ?Mobility Specialist ?Primary Phone [phone] ? ?

## 2021-06-02 NOTE — Progress Notes (Signed)
Hypoglycemic Event ? ?CBG: 70 ? ?Treatment: 4 oz juice/soda ? ?Symptoms: None ? ?Follow-up CBG: Time:2256 CBG Result:128 ? ?Possible Reasons for Event: Inadequate meal intake ? ?Comments/MD notified:James Daniels,NP via secure chat ? ? ? ?Miachel Roux C ? ? ?

## 2021-06-02 NOTE — Progress Notes (Addendum)
1:40pm: ?CSW reached out to [place] at [facility] to determine if there are any beds available - CSW waiting on response. ? ?9:50am: ?CSW spoke with Elmo Putt at [facility] who states she has not received any updates regarding the patient's pending Disability claim. ? ?TOC Director did approve patient for an [place] will attempt to obtain bed offers for patient. ? ?Madilyn Fireman, MSW, LCSW ?Transitions of Care  Clinical Social Worker II ?[phone] ? ?

## 2021-06-02 NOTE — Progress Notes (Signed)
?Progress Note ? ? ?PatientFilemon [name] [id number] DOB: [birth year] DOA: 03/04/2021     28 ?DOS: the patient was seen and examined on 06/02/2021 ?Needs interpreter/speaks [name] ?  ?Brief hospital course: ?Garrett [name] is a 36 y.o. male with medical history significant of severe depression with psychosis, T2DM and orthostatic hypotension who had been holding in ED for about 2 months time waiting on placement. He was being cared for by members in the community, but they have returned home and he no longer has care. He has been boarding in ED with some hypoglycemic episodes, diarrhea (resolved after metformin adjusted) and some orthostatic hypotension that continues to persist despite stopping his antihypertensive medication as well as addition of SSRI/mood stabilizing drugs.  While in the ER he also had an isolated episode of hyperglycemia with serum glucose 503 with a normal anion gap.  Not consistent with DKA.  Labs were checked and he was noted to have significantly elevated LFTs and with concomitant orthostasis he was referred for admission.  Currently, social work continues to work on placement.  He continues to have significant orthostasis that is limiting his ability to ambulate. ?  ? ? ?Assessment and Plan: ?Controlled type 2 diabetes mellitus without complication, with long-term current use of insulin ([place]) ?Hga1c of 5.9 04/2021 ?HOME MEDS: Lantus 40 units daily and Metformin XR 500 mg daily ?Tightly controlled in setting of hypoglycemic events  ?Stopped metformin 2/2 diarrhea.   ?Continue Tradjenta and Glucotrol ?CBGs better controlled on Semglee 20 units daily ? ?Chronic Hepatitis B ?Appreciate GI assistance-suspect chronic Hep B with worsening of LFTs in the setting of ischemic injury during orthostasis vs. Drug induced liver injury (risperdal) ?After DC needs referral to ID for management of chronic hep B ?AFP tumor marker slightly elevated at 10 c/w acute exacerbation and hepatitis process -CT abdomen without  any solid liver masses ?GI recommends outpatient follow-up at [facility] hepatology clinic versus with ID here in [place]. Liver biopsy should be considered. ?-Although may be a candidate for hepatitis B treatment would need to be initiated in the outpatient setting with subsequent insurance authorization to avoid interruption in treatment.  Also has severe depression which may influence decision to initiate hepatitis B medical therapy. ? ? ?Orthostatic hypotension likely 2/2 autonomic dysfunction-resolved as of 06/01/2021, (present on admission) ?Has had episodic orthostasis while in ED during the past 2 months and received intermittent IV fluids ?TSH normal, cortisol checked on 04/14/2021 low at 3.1 but cosyntropin stim test on 2/6 baseline cortisol 11 which ruled out adrenal insufficiency ?-continue TED hose ?-Cont Midodrine ? ?MDD (major depressive disorder), recurrent, severe, with psychosis ([place])- (present on admission) ?Appreciate psychiatry assistance ?Continue lexapro and cogentin ?Risperidone discontinued 2/2 elevated LFTs and Invega initiated by the psychiatric team ? ? ? ?Acute urinary retention-resolved as of 06/01/2021, (present on admission) ?Resolved ? ?Anemia ?Iron panel: Ferritin 696.  B12 in December 2021: 750 and folate 16.5. ?Hemoglobin stable in the 11 g range.  Outpatient follow-up ? ?Thrombocytopenia (HCC)-resolved as of 06/01/2021 ?Platelets stable and > 100,000 ? ?Physical deconditioning- (present on admission) ?PT initially recommended SNF but patient mobility has significantly improved although he still has some episodes of dizziness. ?Physical activity continues to be limited by fatigue. Pt spends most of his days sitting in the bed although able to toilet independently. Previously enc to walk outside of room (using translation svc) ?On 3/2 communication limited by lack of translating service noting app was being dated.  I was able to communicate with  patient that he needed to get out of bed and  walk in the hallway regularly.  Because he stays in the bed most of the time this is why he is tired-not sure he understood.  Plan to recommunicate this tomorrow when translating service available ? ?A physical therapy consult is indicated based on the patient?s mobility assessment. ? ? ?Mobility Assessment (last 72 hours)   ? ? Mobility Assessment   ? ? Garrett [name] 05/30/21 2000 05/29/21 1925 05/29/21 0800 05/28/21 1955  ?  ? Does patient have an order for bedrest or is patient medically unstable No - Continue assessment No - Continue assessment No - Continue assessment No - Continue assessment   ? What is the highest level of mobility based on the progressive mobility assessment? Level 6 (Walks independently in room and hall) - Balance while walking in room without assist - Complete Level 6 (Walks independently in room and hall) - Balance while walking in room without assist - Complete Level 6 (Walks independently in room and hall) - Balance while walking in room without assist - Complete Level 6 (Walks independently in room and hall) - Balance while walking in room without assist - Complete   ? Is the above level different from baseline mobility prior to current illness? No - Consider discontinuing PT/OT No - Consider discontinuing PT/OT No - Consider discontinuing PT/OT No - Consider discontinuing PT/OT   ? ?  ?  ? ?  ? ? ? ?Low back pain ?Controlled on prn Robaxin and Tylenol ?Continue to encourage increased mobility especially outside of room to minimize back pain ? ?Elevated LFTs ?See Hepatitis B notation ? ? ? ? ? ?Subjective:  ?Awake and alert.  Communication limited by lack of availability of translation services noting that the app was being updated when I attempted to use the machine. ? ?Physical Exam: ?Vitals:  ? 06/01/21 1612 06/01/21 2206 06/02/21 0453 06/02/21 0742  ?BP: 127/86 118/82 (!) 131/100 123/84  ?Pulse: 91 91 85 86  ?Resp: 16  18 18   ?Temp: 98 ?F (36.7 ?C) 98.2 ?F (36.8 ?C) 98.2 ?F (36.8 ?C)  97.7 ?F (36.5 ?C)  ?TempSrc: Oral Oral  Oral  ?SpO2: 99% 98% 100% 100%  ?Weight:      ?Height:      ? ?General: Calm, NAD ?Respiratory: Bilateral lung sounds are clear to auscultation. Respiratory effort normal.  Room air ?Cardiovascular: S1 S2, regular pulse. TED hose for recurrent orthostasis are in place. ?Gastrointestinal: Abdomen is nondistended, soft and nontender. Normal bowel sounds heard. LBM 3/1 ?Neurological: Cranial nerves II through XII are grossly intact, patient able to move all extremities x4 with strength 5/5.  Sensation is intact ?Skin: No rashes, lesions or ulcers.  ?Psychiatry: Judgement and insight intact.  Mood & affect flat ? ?Data Reviewed: ?There are no new results to review at this time. ? ? DVT Prophylaxis   ?., Place ted hose  ?Place ted hose  ? ? ?Family Communication:  ?Patient only-- use a foreign Ecologist ? ?Disposition: ?Remains inpatient appropriate because: Unsafe discharge plan-deconditioned with ambulation concerns primarily related to recurrent orthostasis-also uncontrolled diabetes at presentation; severe depression along with language barrier contributing to patient's ability for self-care-care be recommends short-term SNF for rehabilitation; also patient does not have a way to pay for SNF or rehab ?Please review any new documentation from CM/LCSW ? ? ? ? ?Planned Discharge Destination:  ?Family care home/Rest home or ALF ? ? ?COVID vaccination status:  ?Unknown ? ?Consultants: ?  Psychiatry ?Gastroenterology ?Procedures: ?Echocardiogram ?Antibiotics: ?None ? ? ? ?Time spent: 15 minutes ? ?Author: ?Erin Hearing, NP ?06/02/2021 10:25 AM ? ?For on call review [email or web address].  ? ?

## 2021-06-03 ENCOUNTER — Inpatient Hospital Stay (HOSPITAL_COMMUNITY): Payer: Medicaid Other

## 2021-06-03 LAB — GLUCOSE, CAPILLARY
Glucose-Capillary: 104 mg/dL — ABNORMAL HIGH (ref 70–99)
Glucose-Capillary: 246 mg/dL — ABNORMAL HIGH (ref 70–99)
Glucose-Capillary: 290 mg/dL — ABNORMAL HIGH (ref 70–99)
Glucose-Capillary: 247 mg/dL — ABNORMAL HIGH (ref 70–99)

## 2021-06-03 MED ORDER — INSULIN GLARGINE-YFGN 100 UNIT/ML ~~LOC~~ SOLN
15.0000 [IU] | Freq: Every day | SUBCUTANEOUS | Status: DC
  Administered 2021-06-03 – 2021-07-12 (×40): 15 [IU] via SUBCUTANEOUS
  Filled 2021-06-03 (×40): qty 0.15

## 2021-06-03 NOTE — Progress Notes (Signed)
Mobility Specialist Progress Note: ? ? 06/03/21 1101  ?Mobility  ?Activity Ambulated with assistance in hallway  ?Level of Assistance Independent  ?Assistive Device None  ?Distance Ambulated (ft) 750 ft  ?Activity Response Tolerated well  ?$Mobility charge 1 Mobility  ? ?Pt received in bed willing to participate in mobility. Complaints of bilateral foot pain. Left in bed with call bell in reach and all needs met.  ? ?Jamika Sadek ?Mobility Specialist ?Primary Phone [phone] ? ?

## 2021-06-03 NOTE — TOC Progression Note (Signed)
Transition of Care (TOC) - Progression Note  ? ? ?Patient Details  ?Name: Garrett [name] ?MRN: [id number] ?Date of Birth: 15-May-1985 ? ?Transition of Care (TOC) CM/SW Contact  ?Janae Bridgeman, RN ?Phone Number: ?06/03/2021, 1:06 PM ? ?Clinical Narrative:    ?CM called and spoke with Hydia, CM [facility] and clinicals were sent by email to [email or web address] for review of clinicals and possible bed offer.  ? ?CM and MSW with DTP Team will continue to follow the patient for placement needs. ? ? ?Expected Discharge Plan: Assisted Living ?Barriers to Discharge: Family Issues, Unsafe home situation ? ?Expected Discharge Plan and Services ?Expected Discharge Plan: Assisted Living ?In-house Referral: [facility] ([facility] pending) ?Discharge Planning Services: CM Consult ?  ?Living arrangements for the past 2 months: Single Family Home ?                ?  ?  ?  ?  ?  ?  ?  ?  ?  ?  ? ? ?Social Determinants of Health (SDOH) Interventions ?  ? ?Readmission Risk Interventions ?No flowsheet data found. ? ?

## 2021-06-03 NOTE — Progress Notes (Signed)
CSW spoke with Greta at [facility] who states there are no male beds available at this time. ? ?Edwin Dada, MSW, LCSW ?Transitions of Care  Clinical Social Worker II ?[phone] ? ?

## 2021-06-03 NOTE — Progress Notes (Signed)
Progress Note   PatientTorres [name] GGY:[id number] DOB: [birth year] DOA: 03/04/2021     29 DOS: the patient was seen and examined on 06/03/2021 Needs interpreter/speaks [name]   Brief hospital course: Garrett [name] is a 36 y.o. male with medical history significant of severe depression with psychosis, T2DM and orthostatic hypotension who had been holding in ED for about 2 months time waiting on placement. He was being cared for by members in the community, but they have returned home and he no longer has care. He has been boarding in ED with some hypoglycemic episodes, diarrhea (resolved after metformin adjusted) and some orthostatic hypotension that continues to persist despite stopping his antihypertensive medication as well as addition of SSRI/mood stabilizing drugs.  While in the ER he also had an isolated episode of hyperglycemia with serum glucose 503 with a normal anion gap.  Not consistent with DKA.  Labs were checked and he was noted to have significantly elevated LFTs and with concomitant orthostasis he was referred for admission.  Currently, social work continues to work on placement.  He continues to have significant orthostasis that is limiting his ability to ambulate.     Assessment and Plan: Controlled type 2 diabetes mellitus without complication, with long-term current use of insulin (HCC) Hga1c of 5.9 04/2021 HOME MEDS: Lantus 40 units daily and Metformin XR 500 mg daily Tightly controlled in setting of hypoglycemic events  Stopped metformin 2/2 diarrhea.   Continue Tradjenta and Glucotrol 3/3 CBG dropped to 70 yesterday afternoon requiring treatment.  Patient describes feeling tired.  We will decrease Semglee to 15 units today  Chronic Hepatitis B Appreciate GI assistance-suspect chronic Hep B with worsening of LFTs in the setting of ischemic injury during orthostasis vs. Drug induced liver injury (risperdal) After DC needs referral to ID for management of chronic hep B AFP tumor  marker slightly elevated at 10 c/w acute exacerbation and hepatitis process -CT abdomen without any solid liver masses GI recommends outpatient follow-up at [facility] hepatology clinic versus with ID here in [place]. Liver biopsy should be considered. -Although may be a candidate for hepatitis B treatment would need to be initiated in the outpatient setting with subsequent insurance authorization to avoid interruption in treatment.  Also has severe depression which may influence decision to initiate hepatitis B medical therapy.   Orthostatic hypotension likely 2/2 autonomic dysfunction-resolved as of 06/01/2021 Has had episodic orthostasis while in ED during the past 2 months and received intermittent IV fluids TSH normal, cortisol checked on 04/14/2021 low at 3.1 but cosyntropin stim test on 2/6 baseline cortisol 11 which ruled out adrenal insufficiency -continue TED hose -Cont Midodrine  MDD (major depressive disorder), recurrent, severe, with psychosis (HCC) Appreciate psychiatry assistance Continue lexapro and cogentin Risperidone discontinued 2/2 elevated LFTs and Invega initiated by the psychiatric team    Acute urinary retention-resolved as of 06/01/2021 Resolved  Anemia Iron panel: Ferritin 696.  B12 in December 2021: 750 and folate 16.5. Hemoglobin stable in the 11 g range.  Outpatient follow-up  Thrombocytopenia (HCC)-resolved as of 06/01/2021 Platelets stable and > 100,000  Physical deconditioning PT initially recommended SNF but patient mobility has significantly improved although he still has some episodes of dizziness. Physical activity continues to be limited by fatigue. Pt spends most of his days sitting in the bed although able to toilet independently. Previously enc to walk outside of room (using translation svc) On 3/2 communication limited by lack of translating service noting app was being dated.  I was able  to communicate with patient that he needed to get out of bed and  walk in the hallway regularly.  Because he stays in the bed most of the time this is why he is tired-not sure he understood.  Plan to recommunicate this tomorrow when translating service available  A physical therapy consult is indicated based on the patients mobility assessment.   Mobility Assessment (last 72 hours)     Mobility Assessment     Row Name 05/30/21 2000 05/29/21 1925 05/29/21 0800 05/28/21 1955     Does patient have an order for bedrest or is patient medically unstable No - Continue assessment No - Continue assessment No - Continue assessment No - Continue assessment    What is the highest level of mobility based on the progressive mobility assessment? Level 6 (Walks independently in room and hall) - Balance while walking in room without assist - Complete Level 6 (Walks independently in room and hall) - Balance while walking in room without assist - Complete Level 6 (Walks independently in room and hall) - Balance while walking in room without assist - Complete Level 6 (Walks independently in room and hall) - Balance while walking in room without assist - Complete    Is the above level different from baseline mobility prior to current illness? No - Consider discontinuing PT/OT No - Consider discontinuing PT/OT No - Consider discontinuing PT/OT No - Consider discontinuing PT/OT              Low back pain Controlled on prn Robaxin and Tylenol Continue to encourage increased mobility especially outside of room to minimize back pain 3/3 patient describing what sounds like radicular pain down the left lower extremity also pain in the anterior thigh with SLR.  No pain in hip or leg when knee flexed and knee brought towards chest and external and internal rotation applied For completeness of evaluation we will check noncontrasted CT of lumbar spine and pelvis to ensure no abnormalities especially disc compression. 3 use of a translator I once again encouraged him to get out of bed  to ambulate noting his fatigue is being driven by disuse and physical deconditioning.  He is likely dropping his O2 sats with walking as well because he only walks around in the room to go to the restroom and perform minimal ADLs  Elevated LFTs See Hepatitis B notation      Subjective:  Awake.  Conversation assisted by translation services.  Patient reported symptoms 1 hypoglycemic yesterday.  Also having what sounds like radicular pain in left lower extremity.  Physical Exam: Vitals:   06/02/21 1513 06/02/21 2046 06/03/21 0445 06/03/21 0719  BP: (!) 127/95 119/82 92/64 122/84  Pulse: 91 90  87  Resp: 18 18 18 19   Temp: 97.7 F (36.5 C) 98 F (36.7 C) 97.8 F (36.6 C) 98.2 F (36.8 C)  TempSrc: Oral Oral Oral Oral  SpO2: 100% 100% 100% 98%  Weight:      Height:       General: Calm, NAD Respiratory: Bilateral lung sounds are clear to auscultation. Respiratory effort normal.  Room air Cardiovascular: S1 S2, regular pulse. TED hose for recurrent orthostasis are in place. Gastrointestinal: Abdomen is nondistended, soft and nontender. Normal bowel sounds heard. LBM 3/1 Neurological: Cranial nerves II through XII are grossly intact, patient able to move all extremities x4 with strength 5/5.  Sensation is intact Musculoskeletal: SLR precipitated radicular pain hip down the leg as well as pain in anterior thigh.  No pain with flexion of knee and application of internal and external rotation Skin: No rashes, lesions or ulcers.  Psychiatry: Judgement and insight intact.  Mood & affect flat  Data Reviewed: There are no new results to review at this time.   DVT Prophylaxis   ., Place ted hose  Place ted hose    Family Communication:  Patient only-- use a foreign Presenter, broadcasting  Disposition: Remains inpatient appropriate because: Unsafe discharge plan-deconditioned with ambulation concerns primarily related to recurrent orthostasis-also uncontrolled diabetes at presentation;  severe depression along with language barrier contributing to patient's ability for self-care-care be recommends short-term SNF for rehabilitation; also patient does not have a way to pay for SNF or rehab Please review any new documentation from CM/LCSW     Planned Discharge Destination:  Family care home/Rest home or ALF   COVID vaccination status:  Unknown  Consultants: Psychiatry Gastroenterology Procedures: Echocardiogram Antibiotics: None    Time spent: 15 minutes  Author: Junious Silk, NP 06/03/2021 10:34 AM  For on call review [email or web address].

## 2021-06-04 DIAGNOSIS — R531 Weakness: Secondary | ICD-10-CM | POA: Diagnosis not present

## 2021-06-04 LAB — GLUCOSE, CAPILLARY
Glucose-Capillary: 81 mg/dL (ref 70–99)
Glucose-Capillary: 98 mg/dL (ref 70–99)
Glucose-Capillary: 201 mg/dL — ABNORMAL HIGH (ref 70–99)
Glucose-Capillary: 86 mg/dL (ref 70–99)

## 2021-06-04 LAB — COMPREHENSIVE METABOLIC PANEL
ALT: 103 U/L — ABNORMAL HIGH (ref 0–44)
AST: 50 U/L — ABNORMAL HIGH (ref 15–41)
Albumin: 3.4 g/dL — ABNORMAL LOW (ref 3.5–5.0)
Alkaline Phosphatase: 62 U/L (ref 38–126)
Anion gap: 6 (ref 5–15)
BUN: 15 mg/dL (ref 6–20)
CO2: 30 mmol/L (ref 22–32)
Calcium: 9.1 mg/dL (ref 8.9–10.3)
Chloride: 104 mmol/L (ref 98–111)
Creatinine, Ser: 0.58 mg/dL — ABNORMAL LOW (ref 0.61–1.24)
GFR, Estimated: >60 mL/min (ref >60)
Glucose, Bld: 80 mg/dL (ref 70–99)
Potassium: 4 mmol/L (ref 3.5–5.1)
Sodium: 140 mmol/L (ref 135–145)
Total Bilirubin: 0.6 mg/dL (ref 0.3–1.2)
Total Protein: 7.7 g/dL (ref 6.5–8.1)

## 2021-06-04 LAB — CBC WITH DIFFERENTIAL/PLATELET
Abs Immature Granulocytes: 0.01 10*3/uL (ref 0.00–0.07)
Basophils Absolute: 0 10*3/uL (ref 0.0–0.1)
Basophils Relative: 0 %
Eosinophils Absolute: 0.3 10*3/uL (ref 0.0–0.5)
Eosinophils Relative: 5 %
HCT: 38.1 % — ABNORMAL LOW (ref 39.0–52.0)
Hemoglobin: 12.5 g/dL — ABNORMAL LOW (ref 13.0–17.0)
Immature Granulocytes: 0 %
Lymphocytes Relative: 46 %
Lymphs Abs: 2.6 10*3/uL (ref 0.7–4.0)
MCH: 29.8 pg (ref 26.0–34.0)
MCHC: 32.8 g/dL (ref 30.0–36.0)
MCV: 90.7 fL (ref 80.0–100.0)
Monocytes Absolute: 0.6 10*3/uL (ref 0.1–1.0)
Monocytes Relative: 11 %
Neutro Abs: 2.1 10*3/uL (ref 1.7–7.7)
Neutrophils Relative %: 38 %
Platelets: 168 10*3/uL (ref 150–400)
RBC: 4.2 MIL/uL — ABNORMAL LOW (ref 4.22–5.81)
RDW: 13.2 % (ref 11.5–15.5)
WBC: 5.6 10*3/uL (ref 4.0–10.5)
nRBC: 0 % (ref 0.0–0.2)

## 2021-06-04 NOTE — Progress Notes (Signed)
?Progress Note ? ? ?PatientBurnell [name] KGU:[id number] DOB: 12-09-1985 DOA: 03/04/2021     30 ?DOS: the patient was seen and examined on 06/04/2021 ?Needs interpreter/speaks Garrett [name] ?  ?Brief hospital course: ?Garrett [name] is a 36 y.o. male with medical history significant of severe depression with psychosis, T2DM and orthostatic hypotension who had been holding in ED for about 2 months time waiting on placement. He was being cared for by members in the community, but they have returned home and he no longer has care. He has been boarding in ED with some hypoglycemic episodes, diarrhea (resolved after metformin adjusted) and some orthostatic hypotension that continues to persist despite stopping his antihypertensive medication as well as addition of SSRI/mood stabilizing drugs.  While in the ER he also had an isolated episode of hyperglycemia with serum glucose 503 with a normal anion gap.  Not consistent with DKA.  Labs were checked and he was noted to have significantly elevated LFTs and with concomitant orthostasis he was referred for admission.  Currently, social work continues to work on placement.  He continues to have significant orthostasis that is limiting his ability to ambulate. ?  ? ? ?Assessment and Plan: ?Low back pain ?Controlled on prn Robaxin and Tylenol ?Continue to encourage increased mobility especially outside of room to minimize back pain ?3/3 patient describing what sounds like radicular pain down the left lower extremity also pain in the anterior thigh with SLR.  No pain in hip or leg when knee flexed and knee brought towards chest and external and internal rotation applied ?For completeness of evaluation we will check noncontrasted CT of lumbar spine and pelvis to ensure no abnormalities especially disc compression. ?3 use of a translator I once again encouraged him to get out of bed to ambulate noting his fatigue is being driven by disuse and physical deconditioning.  He is likely dropping his O2  sats with walking as well because he only walks around in the room to go to the restroom and perform minimal ADLs ? ?Chronic Hepatitis B ?Appreciate GI assistance-suspect chronic Hep B with worsening of LFTs in the setting of ischemic injury during orthostasis vs. Drug induced liver injury (risperdal) ?After DC needs referral to ID for management of chronic hep B ?AFP tumor marker slightly elevated at 10 c/w acute exacerbation and hepatitis process -CT abdomen without any solid liver masses ?GI recommends outpatient follow-up at [facility] hepatology clinic versus with ID here in [place]. Liver biopsy should be considered. ?-Although may be a candidate for hepatitis B treatment would need to be initiated in the outpatient setting with subsequent insurance authorization to avoid interruption in treatment.  Also has severe depression which may influence decision to initiate hepatitis B medical therapy. ? ? ?Physical deconditioning ?PT initially recommended SNF but patient mobility has significantly improved although he still has some episodes of dizziness. ?Physical activity continues to be limited by fatigue. Pt spends most of his days sitting in the bed although able to toilet independently. Previously enc to walk outside of room (using translation svc) ?On 3/2 communication limited by lack of translating service noting app was being dated.  I was able to communicate with patient that he needed to get out of bed and walk in the hallway regularly.  Because he stays in the bed most of the time this is why he is tired-not sure he understood.  Plan to recommunicate this tomorrow when translating service available ? ?A physical therapy consult is indicated based on the patient?s  mobility assessment. ? ? ?Mobility Assessment (last 72 hours)   ? ? Mobility Assessment   ? ? Row Name 05/30/21 2000 05/29/21 1925 05/29/21 0800 05/28/21 1955  ?  ? Does patient have an order for bedrest or is patient medically unstable No -  Continue assessment No - Continue assessment No - Continue assessment No - Continue assessment   ? What is the highest level of mobility based on the progressive mobility assessment? Level 6 (Walks independently in room and hall) - Balance while walking in room without assist - Complete Level 6 (Walks independently in room and hall) - Balance while walking in room without assist - Complete Level 6 (Walks independently in room and hall) - Balance while walking in room without assist - Complete Level 6 (Walks independently in room and hall) - Balance while walking in room without assist - Complete   ? Is the above level different from baseline mobility prior to current illness? No - Consider discontinuing PT/OT No - Consider discontinuing PT/OT No - Consider discontinuing PT/OT No - Consider discontinuing PT/OT   ? ?  ?  ? ?  ? ? ? ?Anemia ?Iron panel: Ferritin 696.  B12 in December 2021: 750 and folate 16.5. ?Hemoglobin stable in the 11 g range.  Outpatient follow-up ? ?Elevated LFTs ?See Hepatitis B notation ? ?Controlled type 2 diabetes mellitus without complication, with long-term current use of insulin (HCC) ?Hga1c of 5.9 04/2021 ?Tightly controlled in setting of hypoglycemic events  ?Stopped metformin 2/2 diarrhea.   ? ?CBG (last 3)  ?Recent Labs  ?  06/03/21 ?1733 06/03/21 ?2008 06/04/21 ?0821  ?GLUCAP 290* 247* 81  ? ? ?MDD (major depressive disorder), recurrent, severe, with psychosis (HCC) ?Appreciate psychiatry assistance ?Continue lexapro and cogentin ?Risperidone discontinued 2/2 elevated LFTs and Invega initiated by the psychiatric team ? ?Acute urinary retention-resolved as of 06/01/2021 ?Resolved ? ?Thrombocytopenia (HCC)-resolved as of 06/01/2021 ?Platelets stable and > 100,000 ? ?Orthostatic hypotension likely 2/2 autonomic dysfunction-resolved as of 06/01/2021 ?Has had episodic orthostasis while in ED during the past 2 months and received intermittent IV fluids ?TSH normal, cortisol checked on 04/14/2021  low at 3.1 but cosyntropin stim test on 2/6 baseline cortisol 11 which ruled out adrenal insufficiency ?-continue TED hose ?-Cont Midodrine ? ?Subjective:  ?No complaints ? ?Physical Exam: ?Vitals:  ? 06/03/21 1730 06/03/21 2126 06/04/21 0455 06/04/21 0819  ?BP: 133/83 135/78 98/68 98/71   ?Pulse: (!) 108 95 94 93  ?Resp: 18 18 18 18   ?Temp: 98.2 ?F (36.8 ?C) 98.2 ?F (36.8 ?C) 98.2 ?F (36.8 ?C) 99 ?F (37.2 ?C)  ?TempSrc: Oral  Oral Oral  ?SpO2: 100% 98% 99% 100%  ?Weight:      ?Height:      ? ?General: NAD ? ?Data Reviewed: ?There are no new results to review at this time. ? ? DVT Prophylaxis   ?., Place ted hose  ?Place ted hose  ? ? ?Disposition: ?Remains inpatient appropriate because: Unsafe discharge plan-deconditioned with ambulation concerns primarily related to recurrent orthostasis-also uncontrolled diabetes at presentation; severe depression along with language barrier contributing to patient's ability for self-care-care be recommends short-term SNF for rehabilitation; also patient does not have a way to pay for SNF or rehab ?Please review any new documentation from CM/LCSW ? ? ?Planned Discharge Destination:  ?Family care home/Rest home or ALF ? ? ?COVID vaccination status:  ?Unknown ? ?Consultants: ?Psychiatry ?Gastroenterology ?Procedures: ?Echocardiogram ?Antibiotics: ?None ? ?Author: ? , MD ?06/04/2021 11:14 AM ? ?For on call  review [email or web address]  ? ?

## 2021-06-04 NOTE — Progress Notes (Signed)
Mobility Specialist Progress Note: ? ? 06/04/21 1229  ?Mobility  ?Activity Ambulated with assistance in hallway  ?Level of Assistance Independent  ?Assistive Device None  ?Distance Ambulated (ft) 750 ft  ?Activity Response Tolerated well  ?$Mobility charge 1 Mobility  ? ?Pt received in bed willing to participate in mobility. Complaints of foot and back pain. Left in bed with call bell in reach and all needs met.  ? ?Nova Schmuhl ?Mobility Specialist ?Primary Phone [phone] ? ?

## 2021-06-05 DIAGNOSIS — R531 Weakness: Secondary | ICD-10-CM | POA: Diagnosis not present

## 2021-06-05 LAB — GLUCOSE, CAPILLARY
Glucose-Capillary: 99 mg/dL (ref 70–99)
Glucose-Capillary: 129 mg/dL — ABNORMAL HIGH (ref 70–99)
Glucose-Capillary: 229 mg/dL — ABNORMAL HIGH (ref 70–99)
Glucose-Capillary: 157 mg/dL — ABNORMAL HIGH (ref 70–99)

## 2021-06-05 NOTE — Progress Notes (Signed)
Mobility Specialist Progress Note: ? ? 06/05/21 1329  ?Mobility  ?Activity Ambulated with assistance in hallway  ?Level of Assistance Independent  ?Assistive Device None  ?Distance Ambulated (ft) 750 ft  ?Activity Response Tolerated well  ?$Mobility charge 1 Mobility  ? ?Pt received in bed willing to participate in mobility. No complaints of pain and asymptomatic. Pt left in bed with call bell in reach and all needs met.  ? ?Garrett [name] ?Mobility Specialist ?Primary Phone [phone] ? ?

## 2021-06-05 NOTE — Progress Notes (Addendum)
?Progress Note ? ? ?PatientKeylin [name] TZG:[id number] DOB: 1985/04/25 DOA: 03/04/2021     31 ?DOS: the patient was seen and examined on 06/05/2021 ?Needs interpreter/speaks [name] ?  ?Brief hospital course: ?Garrett [name] is a 36 y.o. male with medical history significant of severe depression with psychosis, T2DM and orthostatic hypotension who had been holding in ED for about 2 months time waiting on placement. He was being cared for by members in the community, but they have returned home and he no longer has care. He has been boarding in ED with some hypoglycemic episodes, diarrhea (resolved after metformin adjusted) and some orthostatic hypotension that continues to persist despite stopping his antihypertensive medication as well as addition of SSRI/mood stabilizing drugs.  While in the ER he also had an isolated episode of hyperglycemia with serum glucose 503 with a normal anion gap.  Not consistent with DKA.  Labs were checked and he was noted to have significantly elevated LFTs and with concomitant orthostasis he was referred for admission.  Currently, social work continues to work on placement.  He continues to have significant orthostasis that is limiting his ability to ambulate. ?  ? ? ?Assessment and Plan: ?Low back pain ?Controlled on prn Robaxin and Tylenol ?Continue to encourage increased mobility especially outside of room to minimize back pain ?3/3 patient describing what sounds like radicular pain down the left lower extremity also pain in the anterior thigh with SLR.  No pain in hip or leg when knee flexed and knee brought towards chest and external and internal rotation applied ?For completeness of evaluation we will check noncontrasted CT of lumbar spine and pelvis to ensure no abnormalities especially disc compression. ?3 use of a translator I once again encouraged him to get out of bed to ambulate noting his fatigue is being driven by disuse and physical deconditioning.  He is likely dropping his O2  sats with walking as well because he only walks around in the room to go to the restroom and perform minimal ADLs ? ?Chronic Hepatitis B ?Appreciate GI assistance-suspect chronic Hep B with worsening of LFTs in the setting of ischemic injury during orthostasis vs. Drug induced liver injury (risperdal) ?After DC needs referral to ID for management of chronic hep B ?AFP tumor marker slightly elevated at 10 c/w acute exacerbation and hepatitis process -CT abdomen without any solid liver masses ?GI recommends outpatient follow-up at [facility] hepatology clinic versus with ID here in [place]. Liver biopsy should be considered. ?-Although may be a candidate for hepatitis B treatment would need to be initiated in the outpatient setting with subsequent insurance authorization to avoid interruption in treatment.  Also has severe depression which may influence decision to initiate hepatitis B medical therapy. ? ? ?Physical deconditioning ?PT initially recommended SNF but patient mobility has significantly improved although he still has some episodes of dizziness. ?Physical activity continues to be limited by fatigue. Pt spends most of his days sitting in the bed although able to toilet independently. Previously enc to walk outside of room (using translation svc) ?On 3/2 communication limited by lack of translating service noting app was being dated.  I was able to communicate with patient that he needed to get out of bed and walk in the hallway regularly.  Because he stays in the bed most of the time this is why he is tired-not sure he understood.  Plan to recommunicate this tomorrow when translating service available ? ?A physical therapy consult is indicated based on the patient?s  mobility assessment. ? ? ?Mobility Assessment (last 72 hours)   ? ? Mobility Assessment   ? ? Row Name 05/30/21 2000 05/29/21 1925 05/29/21 0800 05/28/21 1955  ?  ? Does patient have an order for bedrest or is patient medically unstable No -  Continue assessment No - Continue assessment No - Continue assessment No - Continue assessment   ? What is the highest level of mobility based on the progressive mobility assessment? Level 6 (Walks independently in room and hall) - Balance while walking in room without assist - Complete Level 6 (Walks independently in room and hall) - Balance while walking in room without assist - Complete Level 6 (Walks independently in room and hall) - Balance while walking in room without assist - Complete Level 6 (Walks independently in room and hall) - Balance while walking in room without assist - Complete   ? Is the above level different from baseline mobility prior to current illness? No - Consider discontinuing PT/OT No - Consider discontinuing PT/OT No - Consider discontinuing PT/OT No - Consider discontinuing PT/OT   ? ?  ?  ? ?  ? ? ? ?Anemia ?Iron panel: Ferritin 696.  B12 in December 2021: 750 and folate 16.5. ?Hemoglobin stable in the 11 g range.  Outpatient follow-up ? ?Elevated LFTs ?See Hepatitis B notation ? ?Controlled type 2 diabetes mellitus without complication, with long-term current use of insulin (HCC) ?Hga1c of 5.9 04/2021 ?Tightly controlled in setting of hypoglycemic events  ?Stopped metformin 2/2 diarrhea.   ? ?CBG (last 3)  ?Recent Labs  ?  06/04/21 ?1645 06/04/21 ?1930 06/05/21 ?0757  ?GLUCAP 201* 86 99  ? ? ? ?MDD (major depressive disorder), recurrent, severe, with psychosis (HCC) ?Appreciate psychiatry assistance ?Continue lexapro and cogentin ?Risperidone discontinued 2/2 elevated LFTs and Invega initiated by the psychiatric team ? ?Acute urinary retention-resolved as of 06/01/2021 ?Resolved ? ?Thrombocytopenia (HCC)-resolved as of 06/01/2021 ?Platelets stable and > 100,000 ? ?Orthostatic hypotension likely 2/2 autonomic dysfunction-resolved as of 06/01/2021 ?Has had episodic orthostasis while in ED during the past 2 months and received intermittent IV fluids ?TSH normal, cortisol checked on 04/14/2021  low at 3.1 but cosyntropin stim test on 2/6 baseline cortisol 11 which ruled out adrenal insufficiency ?-continue TED hose ?-Cont Midodrine ? ?Subjective:  ?Sleeping, no complaints ? ?Physical Exam: ?Vitals:  ? 06/04/21 1559 06/04/21 2123 06/05/21 0451 06/05/21 0803  ?BP: 112/84 122/76 114/79 105/70  ?Pulse: 100 89 85 99  ?Resp: 20 16 18 16   ?Temp: 97.8 ?F (36.6 ?C) 98.2 ?F (36.8 ?C) 97.7 ?F (36.5 ?C) 97.8 ?F (36.6 ?C)  ?TempSrc: Oral Oral Oral   ?SpO2: 100% 98% 100% 97%  ?Weight:      ?Height:      ? ?General: NAD sleeping, no complaints ? ?Data Reviewed: ?There are no new results to review at this time. ? ? DVT Prophylaxis   ?., Place ted hose  ?Place ted hose  ? ? ?Disposition: ?Remains inpatient appropriate because: Unsafe discharge plan-deconditioned with ambulation concerns primarily related to recurrent orthostasis-also uncontrolled diabetes at presentation; severe depression along with language barrier contributing to patient's ability for self-care-care be recommends short-term SNF for rehabilitation; also patient does not have a way to pay for SNF or rehab ?Please review any new documentation from CM/LCSW ? ? ?Planned Discharge Destination:  ?Family care home/Rest home or ALF ? ? ?COVID vaccination status:  ?Unknown ? ?Consultants: ?Psychiatry ?Gastroenterology ?Procedures: ?Echocardiogram ?Antibiotics: ?None ? ?Author: ? , MD ?06/05/2021 11:13 AM ? ?  For on call review [email or web address]  ? ?

## 2021-06-06 DIAGNOSIS — R5381 Other malaise: Secondary | ICD-10-CM | POA: Diagnosis not present

## 2021-06-06 DIAGNOSIS — Z794 Long term (current) use of insulin: Secondary | ICD-10-CM | POA: Diagnosis not present

## 2021-06-06 DIAGNOSIS — E119 Type 2 diabetes mellitus without complications: Secondary | ICD-10-CM | POA: Diagnosis not present

## 2021-06-06 LAB — GLUCOSE, CAPILLARY
Glucose-Capillary: 134 mg/dL — ABNORMAL HIGH (ref 70–99)
Glucose-Capillary: 140 mg/dL — ABNORMAL HIGH (ref 70–99)
Glucose-Capillary: 179 mg/dL — ABNORMAL HIGH (ref 70–99)
Glucose-Capillary: 169 mg/dL — ABNORMAL HIGH (ref 70–99)
Glucose-Capillary: 165 mg/dL — ABNORMAL HIGH (ref 70–99)

## 2021-06-06 MED ORDER — MAGNESIUM HYDROXIDE 400 MG/5ML PO SUSP
30.0000 mL | Freq: Once | ORAL | Status: AC
  Administered 2021-06-20: 30 mL via ORAL
  Filled 2021-06-06: qty 30

## 2021-06-06 NOTE — Progress Notes (Signed)
Mobility Specialist Progress Note: ? ? 06/06/21 1145  ?Mobility  ?Activity Ambulated with assistance in hallway  ?Level of Assistance Independent  ?Assistive Device None  ?Distance Ambulated (ft) 1120 ft  ?Activity Response Tolerated well  ?$Mobility charge 1 Mobility  ? ?Pt received up in room. Complaints of foot pain. Left in bed with call bell in reach and all needs met.  ? ?Skarlet Lyons ?Mobility Specialist ?Primary Phone [phone] ? ?

## 2021-06-06 NOTE — Progress Notes (Signed)
?Progress Note ? ? ?PatientLayne [name] WUJ:[id number] DOB: 12-23-1985 DOA: 03/04/2021     32 ?DOS: the patient was seen and examined on 06/06/2021 ?Needs interpreter/speaks [name] ?  ?Brief hospital course: ?Garrett [name] is a 36 y.o. male with medical history significant of severe depression with psychosis, T2DM and orthostatic hypotension who had been holding in ED for about 2 months time waiting on placement. He was being cared for by members in the community, but they have returned home and he no longer has care. He has been boarding in ED with some hypoglycemic episodes, diarrhea (resolved after metformin adjusted) and some orthostatic hypotension that continues to persist despite stopping his antihypertensive medication as well as addition of SSRI/mood stabilizing drugs.  While in the ER he also had an isolated episode of hyperglycemia with serum glucose 503 with a normal anion gap.  Not consistent with DKA.  Labs were checked and he was noted to have significantly elevated LFTs and with concomitant orthostasis he was referred for admission.  Currently, social work continues to work on placement.  He continues to have significant orthostasis that is limiting his ability to ambulate. ?  ? ? ?Assessment and Plan: ?Controlled type 2 diabetes mellitus without complication, with long-term current use of insulin (HCC) ?Hga1c of 5.9 04/2021 ?HOME MEDS: Lantus 40 units daily and Metformin XR 500 mg daily ?Tightly controlled in setting of hypoglycemic events  ?Stopped metformin 2/2 diarrhea.   ?Continue Tradjenta and Glucotrol and Semglee ?CBGs are well controlled ? ?Chronic Hepatitis B ?Appreciate GI assistance-suspect chronic Hep B with worsening of LFTs in the setting of ischemic injury during orthostasis vs. Drug induced liver injury (risperdal) ?After DC needs referral to ID for management of chronic hep B ?AFP tumor marker slightly elevated at 10 c/w acute exacerbation and hepatitis process -CT abdomen without any solid  liver masses ?GI recommends outpatient follow-up at [facility] hepatology clinic versus with ID here in [place]. Liver biopsy should be considered. ?-Although may be a candidate for hepatitis B treatment would need to be initiated in the outpatient setting with subsequent insurance authorization to avoid interruption in treatment.  Also has severe depression which may influence decision to initiate hepatitis B medical therapy. ? ? ?Orthostatic hypotension likely 2/2 autonomic dysfunction-resolved as of 06/01/2021 ?Has had episodic orthostasis while in ED during the past 2 months and received intermittent IV fluids ?TSH normal, cortisol checked on 04/14/2021 low at 3.1 but cosyntropin stim test on 2/6 baseline cortisol 11 which ruled out adrenal insufficiency ?-continue TED hose ?-Cont Midodrine ? ?MDD (major depressive disorder), recurrent, severe, with psychosis (HCC) ?Appreciate psychiatry assistance ?Continue lexapro and cogentin ?Risperidone discontinued 2/2 elevated LFTs and Invega initiated by the psychiatric team ? ? ? ?Acute urinary retention-resolved as of 06/01/2021 ?Resolved ? ?Anemia ?Iron panel: Ferritin 696.  B12 in December 2021: 750 and folate 16.5. ?Hemoglobin stable in the 11 g range.  Outpatient follow-up ? ?Thrombocytopenia (HCC)-resolved as of 06/01/2021 ?Platelets stable and > 100,000 ? ?Physical deconditioning ?PT initially recommended SNF but patient mobility has significantly improved although he still has some episodes of dizziness. ?Physical activity continues to be limited by fatigue. Pt spends most of his days sitting in the bed although able to toilet independently. Previously enc to walk outside of room (using translation svc) ?On 3/2 communication limited by lack of translating service noting app was being dated.  I was able to communicate with patient that he needed to get out of bed and walk in the hallway  regularly.  Because he stays in the bed most of the time this is why he is tired-not  sure he understood.  Plan to recommunicate this tomorrow when translating service available ? ?A physical therapy consult is indicated based on the patient?s mobility assessment. ? ? ?Mobility Assessment (last 72 hours)   ? ? Mobility Assessment   ? ? Row Name 05/30/21 2000 05/29/21 1925 05/29/21 0800 05/28/21 1955  ?  ? Does patient have an order for bedrest or is patient medically unstable No - Continue assessment No - Continue assessment No - Continue assessment No - Continue assessment   ? What is the highest level of mobility based on the progressive mobility assessment? Level 6 (Walks independently in room and hall) - Balance while walking in room without assist - Complete Level 6 (Walks independently in room and hall) - Balance while walking in room without assist - Complete Level 6 (Walks independently in room and hall) - Balance while walking in room without assist - Complete Level 6 (Walks independently in room and hall) - Balance while walking in room without assist - Complete   ? Is the above level different from baseline mobility prior to current illness? No - Consider discontinuing PT/OT No - Consider discontinuing PT/OT No - Consider discontinuing PT/OT No - Consider discontinuing PT/OT   ? ?  ?  ? ?  ? ? ? ?Low back pain ?Controlled on prn Robaxin and Tylenol ?Continue to encourage increased mobility especially outside of room to minimize back pain ?Patient complaining of radicular pain.  CT of pelvis and lumbar spine unremarkable and remained stable with no evidence of nerve root encroachment ?Continue to encourage patient to get out of bed and mobilize to minimize back pain. ? ?Elevated LFTs ?See Hepatitis B notation ? ?Constipation ?Documented bowel movement on 3/1 ?Continue daily MiraLAX and Senokot ?Give one-time dose of milk of magnesia on 3/6 ? ? ? ?Subjective:  ?Alert.  Did not use translation service today.  Patient able to communicate that continues to have some mild back pain.  Encourage  patient once again to mobilize more frequently. ? ?Physical Exam: ?Vitals:  ? 06/05/21 1640 06/05/21 2008 06/06/21 0512 06/06/21 0740  ?BP: 116/77 116/75 (!) 116/99 110/82  ?Pulse: (!) 105 91  97  ?Resp: 17 16 18 16   ?Temp: 98 ?F (36.7 ?C) 98.5 ?F (36.9 ?C) 98.3 ?F (36.8 ?C) 98 ?F (36.7 ?C)  ?TempSrc:  Oral  Oral  ?SpO2: 99% 98%  100%  ?Weight:      ?Height:      ? ?General: Calm, NAD ?Respiratory: Bilateral lung sounds are clear to auscultation. Respiratory effort normal.  Room air ?Cardiovascular: S1 S2, regular pulse. TED hose for recurrent orthostasis are in place. ?Gastrointestinal: Abdomen is nondistended, soft and nontender. Normal bowel sounds heard. LBM 3/1 ?Neurological: Cranial nerves II through XII are grossly intact, patient able to move all extremities x4 with strength 5/5.  Sensation is intact ?Skin: No rashes, lesions or ulcers.  ?Psychiatry: Judgement and insight intact.  Mood & affect flat ? ?Data Reviewed: ?There are no new results to review at this time. ? ? DVT Prophylaxis   ?., Place ted hose  ?Place ted hose  ? ? ?Family Communication:  ?Patient only-- use a foreign ? ?Disposition: ?Remains inpatient appropriate because: Unsafe discharge plan-deconditioned with ambulation concerns primarily related to recurrent orthostasis-also uncontrolled diabetes at presentation; severe depression along with language barrier contributing to patient's ability for self-care-care be recommends  short-term SNF for rehabilitation; also patient does not have a way to pay for SNF or rehab ?Please review any new documentation from CM/LCSW ? ? ? ? ?Planned Discharge Destination:  ?Family care home/Rest home or ALF ? ? ?COVID vaccination status:  ?Unknown ? ?Consultants: ?Psychiatry ?Gastroenterology ?Procedures: ?Echocardiogram ?Antibiotics: ?None ? ? ? ?Time spent: 15 minutes ? ?Author: ?Junious Silk, NP ?06/06/2021 10:42 AM ? ?For on call review [email or web address]  ? ?

## 2021-06-07 DIAGNOSIS — E119 Type 2 diabetes mellitus without complications: Secondary | ICD-10-CM | POA: Diagnosis not present

## 2021-06-07 DIAGNOSIS — F333 Major depressive disorder, recurrent, severe with psychotic symptoms: Secondary | ICD-10-CM | POA: Diagnosis not present

## 2021-06-07 DIAGNOSIS — Z794 Long term (current) use of insulin: Secondary | ICD-10-CM | POA: Diagnosis not present

## 2021-06-07 LAB — GLUCOSE, CAPILLARY
Glucose-Capillary: 97 mg/dL (ref 70–99)
Glucose-Capillary: 136 mg/dL — ABNORMAL HIGH (ref 70–99)
Glucose-Capillary: 160 mg/dL — ABNORMAL HIGH (ref 70–99)
Glucose-Capillary: 137 mg/dL — ABNORMAL HIGH (ref 70–99)

## 2021-06-07 NOTE — Progress Notes (Signed)
?Progress Note ? ? ?PatientTrentin [name] VVO:[id number] DOB: [birth year] DOA: 03/04/2021     33 ?DOS: the patient was seen and examined on 06/07/2021 ?Needs interpreter/speaks [name] ?  ?Brief hospital course: ?Garrett [name] is a 36 y.o. male with medical history significant of severe depression with psychosis, T2DM and orthostatic hypotension who had been holding in ED for about 2 months time waiting on placement. He was being cared for by members in the community, but they have returned home and he no longer has care. He has been boarding in ED with some hypoglycemic episodes, diarrhea (resolved after metformin adjusted) and some orthostatic hypotension that continues to persist despite stopping his antihypertensive medication as well as addition of SSRI/mood stabilizing drugs.  While in the ER he also had an isolated episode of hyperglycemia with serum glucose 503 with a normal anion gap.  Not consistent with DKA.  Labs were checked and he was noted to have significantly elevated LFTs and with concomitant orthostasis he was referred for admission.  Currently, social work continues to work on placement.  He continues to have significant orthostasis that is limiting his ability to ambulate. ?  ? ? ?Assessment and Plan: ?Controlled type 2 diabetes mellitus without complication, with long-term current use of insulin (HCC) ?Hga1c of 5.9 04/2021 ?HOME MEDS: Lantus 40 units daily and Metformin XR 500 mg daily ?Tightly controlled in setting of hypoglycemic events  ?Stopped metformin 2/2 diarrhea.   ?Continue Tradjenta and Glucotrol and Semglee ?CBGs are well controlled and patient is eating less than previous ? ?Chronic Hepatitis B ?Appreciate GI assistance-suspect chronic Hep B with worsening of LFTs in the setting of ischemic injury during orthostasis vs. Drug induced liver injury (risperdal) ?After DC needs referral to ID for management of chronic hep B ?AFP tumor marker slightly elevated at 10 c/w acute exacerbation and hepatitis  process -CT abdomen without any solid liver masses ?GI recommends outpatient follow-up at [facility] hepatology clinic versus with ID here in [place]. Liver biopsy should be considered. ?-Although may be a candidate for hepatitis B treatment would need to be initiated in the outpatient setting with subsequent insurance authorization to avoid interruption in treatment.  Also has severe depression which may influence decision to initiate hepatitis B medical therapy. ? ? ?Orthostatic hypotension likely 2/2 autonomic dysfunction-resolved as of 06/01/2021 ?Has had episodic orthostasis while in ED during the past 2 months and received intermittent IV fluids ?TSH normal, cortisol checked on 04/14/2021 low at 3.1 but cosyntropin stim test on 2/6 baseline cortisol 11 which ruled out adrenal insufficiency ?-continue TED hose ?-Cont Midodrine ? ?MDD (major depressive disorder), recurrent, severe, with psychosis (HCC) ?Appreciate psychiatry assistance ?Continue lexapro and cogentin ?Risperidone discontinued 2/2 elevated LFTs and Invega initiated by the psychiatric team ? ? ? ?Acute urinary retention-resolved as of 06/01/2021 ?Resolved ? ?Anemia ?Iron panel: Ferritin 696.  B12 in December 2021: 750 and folate 16.5. ?Hemoglobin stable in the 11 g range.  Outpatient follow-up ? ?Thrombocytopenia (HCC)-resolved as of 06/01/2021 ?Platelets stable and > 100,000 ? ?Physical deconditioning ?PT initially recommended SNF but patient mobility has significantly improved although he still has some episodes of dizziness. ?Physical activity continues to be limited by fatigue. Pt spends most of his days sitting in the bed although able to toilet independently. Previously enc to walk outside of room (using translation svc) ?On 3/2 communication limited by lack of translating service noting app was being dated.  I was able to communicate with patient that he needed to get out  of bed and walk in the hallway regularly.  Because he stays in the bed most of  the time this is why he is tired-not sure he understood.  Plan to recommunicate this tomorrow when translating service available ? ?A physical therapy consult is indicated based on the patient?s mobility assessment. ? ? ?Mobility Assessment (last 72 hours)   ? ? Mobility Assessment   ? ? Row Name 05/30/21 2000 05/29/21 1925 05/29/21 0800 05/28/21 1955  ?  ? Does patient have an order for bedrest or is patient medically unstable No - Continue assessment No - Continue assessment No - Continue assessment No - Continue assessment   ? What is the highest level of mobility based on the progressive mobility assessment? Level 6 (Walks independently in room and hall) - Balance while walking in room without assist - Complete Level 6 (Walks independently in room and hall) - Balance while walking in room without assist - Complete Level 6 (Walks independently in room and hall) - Balance while walking in room without assist - Complete Level 6 (Walks independently in room and hall) - Balance while walking in room without assist - Complete   ? Is the above level different from baseline mobility prior to current illness? No - Consider discontinuing PT/OT No - Consider discontinuing PT/OT No - Consider discontinuing PT/OT No - Consider discontinuing PT/OT   ? ?  ?  ? ?  ? ? ? ?Low back pain ?Controlled on prn Robaxin and Tylenol ?Continue to encourage increased mobility especially outside of room to minimize back pain ?Patient complaining of radicular pain.  CT of pelvis and lumbar spine unremarkable and remained stable with no evidence of nerve root encroachment ?Continue to encourage patient to get out of bed and mobilize to minimize back pain. ? ?Elevated LFTs ?See Hepatitis B notation ? ?Constipation ?Documented bowel movement on 3/1 ?Continue daily MiraLAX and Senokot ?Give one-time dose of milk of magnesia on 3/6 ? ? ? ?Subjective:  ?In bed calm and appears comfortable.  No complaints verbalized. ? ?Physical Exam: ?Vitals:  ?  06/06/21 0740 06/06/21 1953 06/07/21 0510 06/07/21 0753  ?BP: 110/82 (!) 137/93 91/60 97/71   ?Pulse: 97 93 94 90  ?Resp: 16 17 18 17   ?Temp: 98 ?F (36.7 ?C) 98 ?F (36.7 ?C) 97.6 ?F (36.4 ?C) 97.7 ?F (36.5 ?C)  ?TempSrc: Oral Oral Oral   ?SpO2: 100% 97% 98% (!) 86%  ?Weight:      ?Height:      ? ?General: Calm, NAD ?Respiratory: Bilateral lung sounds are clear to auscultation. Respiratory effort normal.  Room air ?Cardiovascular: S1 S2, regular pulse. TED hose for recurrent orthostasis are in place. ?Gastrointestinal: Abdomen is nondistended, soft and nontender. Normal bowel sounds heard. LBM 3/6 ?Neurological: Cranial nerves II through XII are grossly intact, patient able to move all extremities x4 with strength 5/5.  Sensation is intact ?Skin: No rashes, lesions or ulcers.  ?Psychiatry: Judgement and insight intact.  Mood & affect flat ? ?Data Reviewed: ?There are no new results to review at this time. ? ? DVT Prophylaxis   ?., Place ted hose  ?Place ted hose  ? ? ?Family Communication:  ?Patient only-- use a foreign Presenter, broadcasting ? ?Disposition: ?Remains inpatient appropriate because: Unsafe discharge plan-deconditioned with ambulation concerns primarily related to recurrent orthostasis-also uncontrolled diabetes at presentation; severe depression along with language barrier contributing to patient's ability for self-care-care be recommends short-term SNF for rehabilitation; also patient does not have a way to pay  for SNF or rehab ?Please review any new documentation from CM/LCSW ? ? ? ? ?Planned Discharge Destination:  ?Family care home/Rest home or ALF ? ? ?COVID vaccination status:  ?Unknown ? ?Consultants: ?Psychiatry ?Gastroenterology ?Procedures: ?Echocardiogram ?Antibiotics: ?None ? ? ? ?Time spent: 15 minutes ? ?Author: ?Junious Silk, NP ?06/07/2021 10:13 AM ? ?For on call review [email or web address]  ? ?

## 2021-06-07 NOTE — Progress Notes (Signed)
CSW spoke with Helmut Muster at [facility] who states there are no updates available on the patient's disability application. ? ?CSW attempted to reach [place] of a [facility] in [facility] without success - a voicemail was left requesting a return call. ? ?Edwin Dada, MSW, LCSW ?Transitions of Care  Clinical Social Worker II ?[phone] ? ?

## 2021-06-07 NOTE — Progress Notes (Signed)
Mobility Specialist Progress Note: ? ? 06/07/21 1151  ?Mobility  ?Activity Ambulated with assistance in hallway  ?Level of Assistance Independent  ?Assistive Device None  ?Distance Ambulated (ft) 1120 ft  ?Activity Response Tolerated well  ?$Mobility charge 1 Mobility  ? ?Pt received in bed willing to participate in mobility. Complaints of "not to much" pain in his knees, feet, and back. Left in bed with cal bell in reach and all needs met.  ? ?Erendida Wrenn ?Mobility Specialist ?Primary Phone [phone] ? ?

## 2021-06-07 NOTE — Progress Notes (Signed)
Mobility Specialist Progress Note: ? ? 06/07/21 1652  ?Mobility  ?Activity Ambulated with assistance in hallway  ?Level of Assistance Independent  ?Assistive Device None  ?Distance Ambulated (ft) 500 ft  ?Activity Response Tolerated well  ?$Mobility charge 1 Mobility  ? ?Pt received in bed willing to participate in mobility. Complaints of pain in back and legs. Pt left in bed with call bell in reach and all needs met.  ? ?  ?Mobility Specialist ?Primary Phone [phone] ? ?

## 2021-06-08 DIAGNOSIS — R5381 Other malaise: Secondary | ICD-10-CM | POA: Diagnosis not present

## 2021-06-08 DIAGNOSIS — E119 Type 2 diabetes mellitus without complications: Secondary | ICD-10-CM | POA: Diagnosis not present

## 2021-06-08 DIAGNOSIS — Z794 Long term (current) use of insulin: Secondary | ICD-10-CM | POA: Diagnosis not present

## 2021-06-08 DIAGNOSIS — F333 Major depressive disorder, recurrent, severe with psychotic symptoms: Secondary | ICD-10-CM | POA: Diagnosis not present

## 2021-06-08 LAB — GLUCOSE, CAPILLARY
Glucose-Capillary: 104 mg/dL — ABNORMAL HIGH (ref 70–99)
Glucose-Capillary: 137 mg/dL — ABNORMAL HIGH (ref 70–99)
Glucose-Capillary: 191 mg/dL — ABNORMAL HIGH (ref 70–99)
Glucose-Capillary: 65 mg/dL — ABNORMAL LOW (ref 70–99)

## 2021-06-08 NOTE — Progress Notes (Signed)
CSW spoke with [name] who states he cannot accept this patient due to his young age. ? ?Edwin Dada, MSW, LCSW ?Transitions of Care  Clinical Social Worker II ?[phone] ? ?

## 2021-06-08 NOTE — Progress Notes (Signed)
Mobility Specialist Progress Note: ? ? 06/08/21 1153  ?Mobility  ?Activity Ambulated with assistance in hallway  ?Level of Assistance Independent  ?Assistive Device None  ?Distance Ambulated (ft) 1120 ft  ?Activity Response Tolerated well  ?$Mobility charge 1 Mobility  ? ?Pt received in bed willing to participate in mobility. Complaints of pain in feet and back. Left in bed with call bell in reach and all needs met.  ? ?Garrett [name] ?Mobility Specialist ?Primary Phone [phone] ? ?

## 2021-06-08 NOTE — Progress Notes (Signed)
?Progress Note ? ? ?PatientRock [name] UTM:[id number] DOB: 1985/09/02 DOA: 03/04/2021     34 ?DOS: the patient was seen and examined on 06/08/2021 ?Needs interpreter/speaks Garrett [name] ?  ?Brief hospital course: ?Garrett [name] is a 36 y.o. male with medical history significant of severe depression with psychosis, T2DM and orthostatic hypotension who had been holding in ED for about 2 months time waiting on placement. He was being cared for by members in the community, but they have returned home and he no longer has care. He has been boarding in ED with some hypoglycemic episodes, diarrhea (resolved after metformin adjusted) and some orthostatic hypotension that continues to persist despite stopping his antihypertensive medication as well as addition of SSRI/mood stabilizing drugs.  While in the ER he also had an isolated episode of hyperglycemia with serum glucose 503 with a normal anion gap.  Not consistent with DKA.  Labs were checked and he was noted to have significantly elevated LFTs and with concomitant orthostasis he was referred for admission.  Currently, social work continues to work on placement.  He continues to have significant orthostasis that is limiting his ability to ambulate. ?  ? ? ?Assessment and Plan: ?Controlled type 2 diabetes mellitus without complication, with long-term current use of insulin (HCC) ?Hga1c of 5.9 04/2021 ?HOME MEDS: Lantus 40 units daily and Metformin XR 500 mg daily ?Tightly controlled in setting of hypoglycemic events  ?Stopped metformin 2/2 diarrhea.   ?Continue Tradjenta and Glucotrol and Semglee ?CBGs remain well controlled  ? ?Chronic Hepatitis B ?Appreciate GI assistance-suspect chronic Hep B with worsening of LFTs in the setting of ischemic injury during orthostasis vs. Drug induced liver injury (risperdal) ?After DC needs referral to ID for management of chronic hep B ?AFP tumor marker slightly elevated at 10 c/w acute exacerbation and hepatitis process -CT abdomen without any  solid liver masses ?GI recommends outpatient follow-up at [facility] hepatology clinic versus with ID here in [place]. Liver biopsy should be considered. ?-Although may be a candidate for hepatitis B treatment would need to be initiated in the outpatient setting with subsequent insurance authorization to avoid interruption in treatment.  Also has severe depression which may influence decision to initiate hepatitis B medical therapy. ? ? ?Orthostatic hypotension likely 2/2 autonomic dysfunction-resolved as of 06/01/2021 ?Has had episodic orthostasis while in ED during the past 2 months and received intermittent IV fluids ?TSH normal, cortisol checked on 04/14/2021 low at 3.1 but cosyntropin stim test on 2/6 baseline cortisol 11 which ruled out adrenal insufficiency ?-continue TED hose ?-Cont Midodrine ? ?MDD (major depressive disorder), recurrent, severe, with psychosis (HCC) ?Appreciate psychiatry assistance ?Continue lexapro and cogentin ?Risperidone discontinued 2/2 elevated LFTs and Invega initiated by the psychiatric team ? ? ? ?Acute urinary retention-resolved as of 06/01/2021 ?Resolved ? ?Anemia ?Iron panel: Ferritin 696.  B12 in December 2021: 750 and folate 16.5. ?Hemoglobin stable in the 11 g range.  Outpatient follow-up ? ?Thrombocytopenia (HCC)-resolved as of 06/01/2021 ?Platelets stable and > 100,000 ? ?Physical deconditioning ?PT initially recommended SNF but patient mobility has significantly improved although he still has some episodes of dizziness. ?Physical activity continues to be limited by fatigue. Pt spends most of his days sitting in the bed although able to toilet independently. Previously enc to walk outside of room (using translation svc) ?On 3/2 communication limited by lack of translating service noting app was being dated.  I was able to communicate with patient that he needed to get out of bed and walk in the  hallway regularly.  Because he stays in the bed most of the time this is why he is  tired-not sure he understood.  Plan to recommunicate this tomorrow when translating service available ? ?A physical therapy consult is indicated based on the patient?s mobility assessment. ? ? ?Mobility Assessment (last 72 hours)   ? ? Mobility Assessment   ? ? Row Name 05/30/21 2000 05/29/21 1925 05/29/21 0800 05/28/21 1955  ?  ? Does patient have an order for bedrest or is patient medically unstable No - Continue assessment No - Continue assessment No - Continue assessment No - Continue assessment   ? What is the highest level of mobility based on the progressive mobility assessment? Level 6 (Walks independently in room and hall) - Balance while walking in room without assist - Complete Level 6 (Walks independently in room and hall) - Balance while walking in room without assist - Complete Level 6 (Walks independently in room and hall) - Balance while walking in room without assist - Complete Level 6 (Walks independently in room and hall) - Balance while walking in room without assist - Complete   ? Is the above level different from baseline mobility prior to current illness? No - Consider discontinuing PT/OT No - Consider discontinuing PT/OT No - Consider discontinuing PT/OT No - Consider discontinuing PT/OT   ? ?  ?  ? ?  ? ? ? ?Low back pain ?Controlled on prn Robaxin and Tylenol ?Continue to encourage increased mobility especially outside of room to minimize back pain ?Patient complaining of radicular pain.  CT of pelvis and lumbar spine unremarkable and remained stable with no evidence of nerve root encroachment ?Continue to encourage patient to get out of bed and mobilize to minimize back pain. ? ?Elevated LFTs ?See Hepatitis B notation ? ?Constipation ?Documented bowel movement on 3/1 ?Continue daily MiraLAX and Senokot ?Give one-time dose of milk of magnesia on 3/6 ? ? ? ?Subjective:  ? ? ?Physical Exam: ?Vitals:  ? 06/07/21 1611 06/07/21 2034 06/08/21 0450 06/08/21 0741  ?BP: 109/70 122/81 110/78 104/74   ?Pulse: (!) 109 92 95 91  ?Resp: 18 18 18 16   ?Temp: 97.8 ?F (36.6 ?C) 98.2 ?F (36.8 ?C) 97.9 ?F (36.6 ?C) 98 ?F (36.7 ?C)  ?TempSrc:  Oral Oral Oral  ?SpO2: 100% 100%  99%  ?Weight:      ?Height:      ? ?General: Calm, NAD ?Respiratory: Bilateral lung sounds are clear to auscultation. Respiratory effort normal.  Room air ?Cardiovascular: S1 S2, regular pulse. TED hose for recurrent orthostasis are in place. ?Gastrointestinal: Abdomen is nondistended, soft and nontender. Normal bowel sounds heard. LBM 3/6 ?Neurological: Cranial nerves II through XII are grossly intact, patient able to move all extremities x4 with strength 5/5.  Sensation is intact ?Skin: No rashes, lesions or ulcers.  ?Psychiatry: Judgement and insight intact.  Mood & affect flat ? ?Data Reviewed: ?There are no new results to review at this time. ? ? DVT Prophylaxis   ?., Place ted hose  ?Place ted hose  ? ? ?Family Communication:  ?Patient only-- use a foreign Presenter, broadcastinglanguage translator ? ?Disposition: ?Remains inpatient appropriate because: Unsafe discharge plan-deconditioned with ambulation concerns primarily related to recurrent orthostasis-also uncontrolled diabetes at presentation; severe depression along with language barrier contributing to patient's ability for self-care-care be recommends short-term SNF for rehabilitation; also patient does not have a way to pay for SNF or rehab ?Please review any new documentation from CM/LCSW ? ? ? ? ?Planned  Discharge Destination:  ?Family care home/Rest home or ALF ? ? ?COVID vaccination status:  ?Unknown ? ?Consultants: ?Psychiatry ?Gastroenterology ?Procedures: ?Echocardiogram ?Antibiotics: ?None ? ? ? ?Time spent: 15 minutes ? ?Author: ?Junious Silk, NP ?06/08/2021 12:04 PM ? ?For on call review [email or web address].  ? ?

## 2021-06-08 NOTE — Progress Notes (Signed)
PT Cancellation Note ? ?Patient Details ?Name: Simuel Ager ?MRN: [id number] ?DOB: 16-Apr-1985 ? ? ?Cancelled Treatment:    Reason Eval/Treat Not Completed: Other (comment).  Took a longer walk with mobility tech three hours ago but declining PT.  Follow up at another time. ? ? ?Ivar Drape ?06/08/2021, 2:45 PM ? ?Samul Dada, PT PhD ?Acute Rehab Dept. Number: [facility] [phone] and [facility] [phone] ? ?

## 2021-06-09 DIAGNOSIS — E119 Type 2 diabetes mellitus without complications: Secondary | ICD-10-CM | POA: Diagnosis not present

## 2021-06-09 DIAGNOSIS — Z794 Long term (current) use of insulin: Secondary | ICD-10-CM | POA: Diagnosis not present

## 2021-06-09 LAB — GLUCOSE, CAPILLARY
Glucose-Capillary: 147 mg/dL — ABNORMAL HIGH (ref 70–99)
Glucose-Capillary: 158 mg/dL — ABNORMAL HIGH (ref 70–99)
Glucose-Capillary: 192 mg/dL — ABNORMAL HIGH (ref 70–99)
Glucose-Capillary: 62 mg/dL — ABNORMAL LOW (ref 70–99)
Glucose-Capillary: 78 mg/dL (ref 70–99)

## 2021-06-09 MED ORDER — GLIPIZIDE 5 MG PO TABS
5.0000 mg | ORAL_TABLET | Freq: Every day | ORAL | Status: DC
  Administered 2021-06-10 – 2021-07-12 (×33): 5 mg via ORAL
  Filled 2021-06-09 (×33): qty 1

## 2021-06-09 NOTE — Progress Notes (Signed)
Physical Therapy Treatment ?Patient Details ?Name: Garrett [name] ?MRN: [id number] ?DOB: [birth year] ?Today's Date: 06/09/2021 ? ? ?History of Present Illness Pt is a 36 y.o. male presenting to ED 03/04/21 with withdrawal from friends, lack of oral intake, possibly catatonic behavior; thought to be secondary to MDD. Per nsg pt sustained a fall with unsteady knees early on in his admission. Current issue of hypotension, symptomatic and admitted to floor on 05/05/21 with transaminitis and right sided abdominal pain. PMH includes DM, medication non-compliance, AMS, DKA, acute metabolic encephalopathy, MDD. ? ?  ?PT Comments  ? ? Pt making progress towards goals with pt independent with ambulation in hall. Pt able to perform multiple bouts of side stepping, retro stepping, tandem stepping, and braiding to challenge and improve standing and walking balance with minimal LOB and pt able to self correct in any and all instances. Pt able perform single leg stance practice and maintain for a few seconds on each leg. Pt continues to benefit from skilled PT services to progress toward functional mobility goals.  ?  ?Recommendations for follow up therapy are one component of a multi-disciplinary discharge planning process, led by the attending physician.  Recommendations may be updated based on patient status, additional functional criteria and insurance authorization. ? ?Follow Up Recommendations ? No PT follow up ?  ?  ?Assistance Recommended at Discharge PRN  ?Patient can return home with the following Assist for transportation;Assistance with cooking/housework;Direct supervision/assist for financial management;Direct supervision/assist for medications management ?  ?Equipment Recommendations ? None recommended by PT  ?  ?Recommendations for Other Services   ? ? ?  ?Precautions / Restrictions Precautions ?Precautions: None ?Precaution Comments: not orthostatic during session today ?Restrictions ?Weight Bearing Restrictions: No  ?   ? ?Mobility ? Bed Mobility ?Overal bed mobility: Independent ?  ?  ?  ?  ?  ?  ?  ?  ? ?Transfers ?Overall transfer level: Independent ?  ?  ?  ?  ?  ?  ?  ?  ?  ?  ? ?Ambulation/Gait ?Ambulation/Gait assistance: Independent ?Gait Distance (Feet): 300 Feet ?Assistive device: None ?Gait Pattern/deviations: Step-through pattern, Narrow base of support ?Gait velocity: normal ?  ?  ?  ? ? ?Stairs ?  ?  ?  ?  ?  ? ? ?Wheelchair Mobility ?  ? ?Modified Rankin (Stroke Patients Only) ?  ? ? ?  ?Balance   ?  ?  ?  ?  ?Standing balance support: No upper extremity supported ?Standing balance-Leahy Scale: Good ?  ?  ?  ?  ?  ?  ?  ?  ?  ?  ?  ?  ?  ? ?  ?Cognition Arousal/Alertness: Awake/alert ?Behavior During Therapy: Flat affect ?Overall Cognitive Status: Difficult to assess ?  ?  ?  ?  ?  ?  ?  ?  ?  ?  ?  ?  ?  ?  ?  ?  ?  ?  ?  ? ?  ?Exercises Other Exercises ?Other Exercises: side stepping, braiding, retro stepping, tandem stepping, multiple bouts for balance challenge ?Other Exercises: SLS x5 ea side, able to hold for a few seconds ? ?  ?General Comments   ?  ?  ? ?Pertinent Vitals/Pain Pain Assessment ?Pain Assessment: No/denies pain  ? ? ?Home Living   ?  ?  ?  ?  ?  ?  ?  ?  ?  ?   ?  ?Prior Function    ?  ?  ?   ? ?  PT Goals (current goals can now be found in the care plan section) Acute Rehab PT Goals ?Patient Stated Goal: none reported ?PT Goal Formulation: With patient ?Time For Goal Achievement: 05/31/21 ? ?  ?Frequency ? ? ? Min 1X/week ? ? ? ?  ?PT Plan Current plan remains appropriate  ? ? ?Co-evaluation   ?  ?  ?  ?  ? ?  ?AM-PAC PT "6 Clicks" Mobility   ?Outcome Measure ? Help needed turning from your back to your side while in a flat bed without using bedrails?: None ?Help needed moving from lying on your back to sitting on the side of a flat bed without using bedrails?: None ?Help needed moving to and from a bed to a chair (including a wheelchair)?: None ?Help needed standing up from a chair using your  arms (e.g., wheelchair or bedside chair)?: None ?Help needed to walk in hospital room?: None ?Help needed climbing 3-5 steps with a railing? : A Little ?6 Click Score: 23 ? ?  ?End of Session Equipment Utilized During Treatment: Gait belt ?Activity Tolerance: Patient tolerated treatment well ?Patient left: in bed;with call bell/phone within reach ?Nurse Communication: Mobility status ?PT Visit Diagnosis: Adult, failure to thrive (R62.7);Muscle weakness (generalized) (M62.81);History of falling (Z91.81);Other abnormalities of gait and mobility (R26.89) ?  ? ? ?Time: [id number] ?PT Time Calculation (min) (ACUTE ONLY): 18 min ? ?Charges:  $Therapeutic Exercise: 8-22 mins          ?          ? ?Audry Riles. PTA ?Acute Rehabilitation Services ?Office: [phone] ? ? ? ?Betsey Holiday [name] ?06/09/2021, 10:58 AM ? ?

## 2021-06-09 NOTE — Progress Notes (Signed)
Mobility Specialist Progress Note: ? ? 06/09/21 1132  ?Mobility  ?Activity Ambulated with assistance in hallway  ?Level of Assistance Independent  ?Assistive Device None  ?Distance Ambulated (ft) 750 ft  ?Activity Response Tolerated well  ?$Mobility charge 1 Mobility  ? ?Pt received in bed willing to participate in mobility.Complaints of back and foot pain. Left in bed with call bell in reach and all needs met.  ? ?Garrett [name] ?Mobility Specialist ?Primary Phone [phone] ? ?

## 2021-06-09 NOTE — Progress Notes (Signed)
?PROGRESS NOTE ? ?Garrett [name]  ?DOB: 1985/06/18  ?PCP: Hoy Register, MD ?EHM:[id number]  ?DOA: 03/04/2021 ? LOS: 35 days  ?Hospital Day: 71 ? ?Brief narrative: ?Garrett [name] is a 36 y.o. male with PMH significant for DM2, orthostatic hypotension, severe depression with psychosis. ?On 03/04/2021, patient was brought to the ED by his friend with complaint of noncompliance to medications, not eating, not drinking, isolating himself in being nonverbal. ?He had psychiatric evaluation done.  Per psychiatry note, patient was hearing voices.  He lives in [place] with friends and he was able to perform his activities.  He has had multiple hospitalizations in the past for psychiatric issues.  He would stay controlled with medicines but once he stops taking his medications, his symptoms flares up.  Patient was recommended for inpatient psychiatric placement.  However because of lack of bed availability, patient waited in the ED for about 2 months.  He could not be discharged home because of no contact or care in the community available. ?While in the ED, he had hyperglycemia/hypoglycemic episodes, diarrhea, orthostatic hypotension requiring medication adjustment. ?On 05/05/2021, he was noted to have elevated LFTs, worsening orthostatics and hence he was admitted to hospitalist service. ?He is medically stable for the last several days and is currently just waiting for placement. ? ?Subjective: ?Patient was seen and examined this morning.  Sitting up in bed.  Not in distress.  No new symptoms. ? ?Active Problems: ?  MDD (major depressive disorder), recurrent, severe, with psychosis (HCC) ?  Controlled type 2 diabetes mellitus without complication, with long-term current use of insulin (HCC) ?  Anemia ?  Physical deconditioning ?  Chronic Hepatitis B ?  Low back pain ?  Generalized weakness ?  ? ? ?Assessment and Plan: ?MDD (major depressive disorder), recurrent, severe, with psychosis (HCC) ?-Appreciate psychiatry  assistance ?-Continue lexapro, cogentin and Invega. ?-Risperidone discontinued 2/2 due to elevated LFTs ?-Pending psychiatry placement. ? ?Type 2 diabetes mellitus ?-A1c 5.5 on 05/14/2021 ?-Currently on Semglee 15 units daily, glipizide 10 mg daily and Tradjenta 5 mg daily. ?-Blood sugar level fluctuating.  Had an episode of low blood sugar of 65 last night. ?-I reduce glipizide from 10 mg to 5 mg this morning.  Continue to monitor ?Recent Labs  ?Lab 06/08/21 ?3662 06/08/21 ?1129 06/08/21 ?1628 06/08/21 ?2207 06/09/21 ?9476  ?GLUCAP 104* 137* 191* 65* 147*  ? ?Orthostatic hypotension  ?-likely 2/2 autonomic dysfunction ?-Intermittent orthostatic blood pressure drop requiring IV fluid ?-TSH normal, cortisol checked on 04/14/2021 low at 3.1 but cosyntropin stim test on 2/6 baseline cortisol 11 which ruled out adrenal insufficiency ?-continue midodrine and TED hose ? ?Low back pain ?-Controlled on prn Robaxin and Tylenol ?-Continue to encourage increased mobility especially outside of room to minimize back pain ?-06/03/2021, CT of pelvis and lumbar spine unremarkable and remained stable with no evidence of nerve root encroachment ?-Continue to encourage patient to get out of bed and mobilize to minimize back pain. ? ?Chronic Hepatitis B ?-Hepatitis panel on 05/05/2021 showed antigen positive hepatitis B.  CT abdomen pelvis did not show any solid liver mass. ?-GI consult appreciated.  GI recommended outpatient follow-up at [facility] hepatology clinic versus with ID here in [place].  ?-Although may be a candidate for hepatitis B treatment would need to be initiated in the outpatient setting with subsequent insurance authorization to avoid interruption in treatment.  Also has severe depression which may influence decision to initiate hepatitis B medical therapy. ? ?Recent Labs  ?Lab 06/04/21 ?0112  ?AST  50*  ?ALT 103*  ?ALKPHOS 62  ?BILITOT 0.6  ?PROT 7.7  ?ALBUMIN 3.4*  ?PLT 168  ? ?Physical deconditioning ?-PT initially  recommended SNF but patient mobility has significantly improved although he still has some episodes of dizziness. ?-3/8, seen walking in the hallway with mobility specialist ? ?Chronic anemia ?-Iron panel: Ferritin 696.  B12 in December 2021: 750 and folate 16.5. ?-Hemoglobin stable in the 11 g range.  Outpatient follow-up ? ?Acute urinary retention ?-resolved as of 06/01/2021 ? ?Thrombocytopenia ?-resolved as of 06/01/2021 ?-Platelets stable and > 100,000 ? ?Goals of care ?  Code Status: Full Code  ? ? ?Mobility: Encourage ambulation ? ?Nutritional status:  ?Body mass index is 19.88 kg/m?.  ?  ?  ? ? ? ? ?Diet:  ?Diet Order   ? ?       ?  Diet Carb Modified Fluid consistency: Thin; Room service appropriate? No  Diet effective now       ?  ? ?  ?  ? ?  ? ? ?DVT prophylaxis:  ?Place TED hose Start: 05/09/21 2054 ?Place TED hose Start: 05/05/21 1520 ?  ?Antimicrobials: None ?Fluid: None currently ?Consultants: None currently ?Family Communication: None at bedside ? ?Status is: Inpatient ? ?Continue in-hospital care because: Pending placement ?Level of care: Med-Surg  ? ?Dispo: The patient is from: Home ?             Anticipated d/c is to: DTP ?             Patient currently is medically stable to d/c. ?  Difficult to place patient Yes ? ? ? ? ?Infusions:  ? sodium chloride    ? ? ?Scheduled Meds: ? benztropine  0.5 mg Oral BID  ? escitalopram  10 mg Oral Daily  ? feeding supplement (GLUCERNA SHAKE)  237 mL Oral TID BM  ? [START ON 06/10/2021] glipiZIDE  5 mg Oral QAC breakfast  ? insulin aspart  0-6 Units Subcutaneous TID WC  ? insulin glargine-yfgn  15 Units Subcutaneous Daily  ? linagliptin  5 mg Oral Daily  ? LORazepam  0.5 mg Oral QHS  ? magnesium hydroxide  30 mL Oral Once  ? midodrine  10 mg Oral TID WC  ? paliperidone  3 mg Oral QHS  ? polyethylene glycol  17 g Oral Daily  ? senna-docusate  1 tablet Oral QHS  ? sodium chloride flush  3 mL Intravenous Q12H  ? thiamine  100 mg Oral Daily  ? ? ?PRN meds: ?sodium  chloride, acetaminophen, [COMPLETED] methocarbamol **FOLLOWED BY** methocarbamol, sodium chloride flush  ? ?Antimicrobials: ?Anti-infectives (From admission, onward)  ? ? None  ? ?  ? ? ?Objective: ?Vitals:  ? 06/09/21 0601 06/09/21 0845  ?BP: 122/90 104/79  ?Pulse: 92 100  ?Resp: 16 18  ?Temp:  98 ?F (36.7 ?C)  ?SpO2: 98% 100%  ? ?No intake or output data in the 24 hours ending 06/09/21 1131 ?Filed Weights  ? 05/18/21 0811  ?Weight: 49.3 kg  ? ?Weight change:  ?Body mass index is 19.88 kg/m?.  ? ?Physical Exam: ?General exam: Young male.  Not in physical distress ?Skin: No rashes, lesions or ulcers. ?HEENT: Atraumatic, normocephalic, no obvious bleeding ?Lungs: Clear to auscultation bilaterally ?CVS: Regular rate and rhythm, no murmur ?GI/Abd soft, nontender, nondistended, bowel sound present ?CNS: Alert, awake, oriented to place ?Psychiatry: Sad affect ?Extremities: No pedal edema, no calf tenderness ? ?Data Review: I have personally reviewed the laboratory data and studies  available. ? ?F/u labs  ?Unresulted Labs (From admission, onward)  ? ? None  ? ?  ? ? ?Signed, ?Lorin GlassBinaya Bradleigh Sonnen, MD ?[facility] ?06/09/2021 ? ? ? ? ? ? ? ? ? ? ?  ?

## 2021-06-10 DIAGNOSIS — E119 Type 2 diabetes mellitus without complications: Secondary | ICD-10-CM | POA: Diagnosis not present

## 2021-06-10 DIAGNOSIS — F333 Major depressive disorder, recurrent, severe with psychotic symptoms: Secondary | ICD-10-CM | POA: Diagnosis not present

## 2021-06-10 DIAGNOSIS — Z794 Long term (current) use of insulin: Secondary | ICD-10-CM | POA: Diagnosis not present

## 2021-06-10 LAB — GLUCOSE, CAPILLARY
Glucose-Capillary: 108 mg/dL — ABNORMAL HIGH (ref 70–99)
Glucose-Capillary: 183 mg/dL — ABNORMAL HIGH (ref 70–99)
Glucose-Capillary: 197 mg/dL — ABNORMAL HIGH (ref 70–99)
Glucose-Capillary: 94 mg/dL (ref 70–99)

## 2021-06-10 MED ORDER — SELENIUM SULFIDE 1 % EX LOTN
TOPICAL_LOTION | Freq: Every day | CUTANEOUS | Status: DC
Start: 2021-06-10 — End: 2021-07-12
  Administered 2021-06-12 – 2021-06-17 (×2): 1 via TOPICAL
  Filled 2021-06-10 (×2): qty 207

## 2021-06-10 NOTE — Progress Notes (Signed)
Mobility Specialist Progress Note  ? ? 06/10/21 1139  ?Mobility  ?Activity Refused mobility  ? ?Pt stated he already walked and pointed back and forth in front of his bed. ? ?Garrett [name] ?Mobility Specialist  ?M.S. 5N: [phone]  ?

## 2021-06-10 NOTE — Progress Notes (Signed)
Latest Reference Range & Units 06/08/21 22:07 06/09/21 08:46 06/09/21 11:46 06/09/21 16:30 06/09/21 21:16 06/09/21 22:54 06/10/21 08:02  ?Glucose-Capillary 70 - 99 mg/dL 65 (L) 704 (H) 888 (H) 192 (H) 62 (L) 78 108 (H)  ?(L): Data is abnormally low ?(H): Data is abnormally high ? ?Noted that patient's CBGs have been less than 70 mg/dl.  Noted that Glipizide 5 mg daily was started yesterday.  ? ?Recommend decreasing Semglee to 12 units daily. If blood sugars continue to be less than 70 mg/dl, may need to decrease Glipizide dosage to 2.5 mg daily if needed. ? ?Smith Mince RN BSN CDE ?Diabetes Coordinator ?Pager: [phone]  8am-5pm  ?

## 2021-06-10 NOTE — Progress Notes (Signed)
?Progress Note ? ? ?PatientJonanthan [name] ZMO:[id number] DOB: [birth year] DOA: 03/04/2021     36 ?DOS: the patient was seen and examined on 06/10/2021 ?Needs interpreter/speaks [name] ?  ?Brief hospital course: ?Garrett [name] is a 36 y.o. male with medical history significant of severe depression with psychosis, T2DM and orthostatic hypotension who had been holding in ED for about 2 months time waiting on placement. He was being cared for by members in the community, but they have returned home and he no longer has care. He has been boarding in ED with some hypoglycemic episodes, diarrhea (resolved after metformin adjusted) and some orthostatic hypotension that continues to persist despite stopping his antihypertensive medication as well as addition of SSRI/mood stabilizing drugs.  While in the ER he also had an isolated episode of hyperglycemia with serum glucose 503 with a normal anion gap.  Not consistent with DKA.  Labs were checked and he was noted to have significantly elevated LFTs and with concomitant orthostasis he was referred for admission.  Currently, social work continues to work on placement.  He continues to have significant orthostasis that is limiting his ability to ambulate. ?  ? ? ?Assessment and Plan: ?Controlled type 2 diabetes mellitus without complication, with long-term current use of insulin (HCC) ?Hga1c of 5.9 04/2021 ?HOME MEDS: Lantus 40 units daily and Metformin XR 500 mg daily ?Tightly controlled in setting of hypoglycemic events  ?Stopped metformin 2/2 diarrhea.   ?Continue Tradjenta and Glucotrol and Semglee ?CBGs remain well controlled  ?Patient requesting a snack before I left the room ? ?Chronic Hepatitis B ?Appreciate GI assistance-suspect chronic Hep B with worsening of LFTs in the setting of ischemic injury during orthostasis vs. Drug induced liver injury (risperdal) ?After DC needs referral to ID for management of chronic hep B ?AFP tumor marker slightly elevated at 10 c/w acute  exacerbation and hepatitis process -CT abdomen without any solid liver masses ?GI recommends outpatient follow-up at [facility] hepatology clinic versus with ID here in [place]. Liver biopsy should be considered. ?-Although may be a candidate for hepatitis B treatment would need to be initiated in the outpatient setting with subsequent insurance authorization to avoid interruption in treatment.  Also has severe depression which may influence decision to initiate hepatitis B medical therapy. ? ? ?Orthostatic hypotension likely 2/2 autonomic dysfunction-resolved as of 06/01/2021 ?Has had episodic orthostasis while in ED during the past 2 months and received intermittent IV fluids ?TSH normal, cortisol checked on 04/14/2021 low at 3.1 but cosyntropin stim test on 2/6 baseline cortisol 11 which ruled out adrenal insufficiency ?-continue TED hose ?-Cont Midodrine ? ?MDD (major depressive disorder), recurrent, severe, with psychosis (HCC) ?Appreciate psychiatry assistance ?Continue lexapro and cogentin ?Risperidone discontinued 2/2 elevated LFTs and Invega initiated by the psychiatric team ?Hair appears greasy and unkempt.  When asked patient states he has shower supplies and has been showering.  Here also has dandruff so have ordered Selsun shampoo to use daily ? ? ? ?Acute urinary retention-resolved as of 06/01/2021 ?Resolved ? ?Anemia ?Iron panel: Ferritin 696.  B12 in December 2021: 750 and folate 16.5. ?Hemoglobin stable in the 11 g range.  Outpatient follow-up ? ?Thrombocytopenia (HCC)-resolved as of 06/01/2021 ?Platelets stable and > 100,000 ? ?Physical deconditioning ?PT initially recommended SNF but patient mobility has significantly improved although he still has some episodes of dizziness. ?Physical activity continues to be limited by fatigue. Pt spends most of his days sitting in the bed although able to toilet independently. Previously enc to walk  outside of room (using translation svc) ?On 3/2 communication limited  by lack of translating service noting app was being dated.  I was able to communicate with patient that he needed to get out of bed and walk in the hallway regularly.  Because he stays in the bed most of the time this is why he is tired-not sure he understood.  Plan to recommunicate this tomorrow when translating service available ? ?A physical therapy consult is indicated based on the patient?s mobility assessment. ? ? ?Mobility Assessment (last 72 hours)   ? ? Mobility Assessment   ? ? [place] Name 05/30/21 2000 05/29/21 1925 05/29/21 0800 05/28/21 1955  ?  ? Does patient have an order for bedrest or is patient medically unstable No - Continue assessment No - Continue assessment No - Continue assessment No - Continue assessment   ? What is the highest level of mobility based on the progressive mobility assessment? Level 6 (Walks independently in room and hall) - Balance while walking in room without assist - Complete Level 6 (Walks independently in room and hall) - Balance while walking in room without assist - Complete Level 6 (Walks independently in room and hall) - Balance while walking in room without assist - Complete Level 6 (Walks independently in room and hall) - Balance while walking in room without assist - Complete   ? Is the above level different from baseline mobility prior to current illness? No - Consider discontinuing PT/OT No - Consider discontinuing PT/OT No - Consider discontinuing PT/OT No - Consider discontinuing PT/OT   ? ?  ?  ? ?  ? ? ? ?Low back pain ?Controlled on prn Robaxin and Tylenol ?Continue to encourage increased mobility especially outside of room to minimize back pain ?Patient complaining of radicular pain.  CT of pelvis and lumbar spine unremarkable and remained stable with no evidence of nerve root encroachment ?Continue to encourage patient to get out of bed and mobilize to minimize back pain. ? ?Elevated LFTs ?See Hepatitis B notation ? ?Constipation ?Documented bowel movement on  3/1 ?Continue daily MiraLAX and Senokot ?Give one-time dose of milk of magnesia on 3/6 ? ? ? ?Subjective:  ?Alert.  No complaints.  Utilized translation service to communicate with patient ? ?Physical Exam: ?Vitals:  ? 06/09/21 1943 06/10/21 0322 06/10/21 0816 06/10/21 1037  ?BP: 114/80 101/79 103/74 133/79  ?Pulse: 98 94 (!) 101 81  ?Resp: 15 15 16 18   ?Temp: 98.3 ?F (36.8 ?C) 97.7 ?F (36.5 ?C) 98.2 ?F (36.8 ?C) (!) 97.5 ?F (36.4 ?C)  ?TempSrc: Oral Oral Oral Oral  ?SpO2: 98% 99% 99% 95%  ?Weight:      ?Height:      ? ?General: Calm, NAD ?Respiratory: Bilateral lung sounds are clear to auscultation. Respiratory effort normal.  Room air ?Cardiovascular: S1 S2, regular pulse. TED hose for recurrent orthostasis are in place. ?Gastrointestinal: Abdomen is nondistended, soft and nontender. Normal bowel sounds heard. LBM 3/8 ?Neurological: Cranial nerves II through XII are grossly intact, patient able to move all extremities x4 with strength 5/5.  Sensation is intact ?Hair and skin: No rashes, lesions or ulcers.  Patient's hair is greasy and unkempt and has dandruff flakes throughout. ?Psychiatry: Judgement and insight intact.  Mood & affect flat ? ?Data Reviewed: ?There are no new results to review at this time. ? ? DVT Prophylaxis   ?., Place ted hose  ?Place ted hose  ? ? ?Family Communication:  ?Patient only-- use a foreign language  translator ? ?Disposition: ?Remains inpatient appropriate because: Unsafe discharge plan-deconditioned with ambulation concerns primarily related to recurrent orthostasis-also uncontrolled diabetes at presentation; severe depression along with language barrier contributing to patient's ability for self-care-care be recommends short-term SNF for rehabilitation; also patient does not have a way to pay for SNF or rehab ?Please review any new documentation from CM/LCSW ? ? ? ? ?Planned Discharge Destination:  ?Family care home/Rest home or ALF ? ? ?COVID vaccination status:   ?Unknown ? ?Consultants: ?Psychiatry ?Gastroenterology ?Procedures: ?Echocardiogram ?Antibiotics: ?None ? ? ? ?Time spent: 15 minutes ? ?Author: ?Erin Hearing, NP ?06/10/2021 11:58 AM ? ?For on call review [email or web address].  ? ?

## 2021-06-10 NOTE — Plan of Care (Signed)

## 2021-06-10 NOTE — Plan of Care (Signed)
°  Problem: Activity: °Goal: Risk for activity intolerance will decrease °Outcome: Progressing °  °Problem: Elimination: °Goal: Will not experience complications related to bowel motility °Outcome: Progressing °Goal: Will not experience complications related to urinary retention °Outcome: Progressing °  °

## 2021-06-11 LAB — COMPREHENSIVE METABOLIC PANEL
ALT: 76 U/L — ABNORMAL HIGH (ref 0–44)
AST: 47 U/L — ABNORMAL HIGH (ref 15–41)
Albumin: 3.3 g/dL — ABNORMAL LOW (ref 3.5–5.0)
Alkaline Phosphatase: 55 U/L (ref 38–126)
Anion gap: 8 (ref 5–15)
BUN: 16 mg/dL (ref 6–20)
CO2: 28 mmol/L (ref 22–32)
Calcium: 9.3 mg/dL (ref 8.9–10.3)
Chloride: 100 mmol/L (ref 98–111)
Creatinine, Ser: 0.61 mg/dL (ref 0.61–1.24)
GFR, Estimated: >60 mL/min (ref >60)
Glucose, Bld: 112 mg/dL — ABNORMAL HIGH (ref 70–99)
Potassium: 4.2 mmol/L (ref 3.5–5.1)
Sodium: 136 mmol/L (ref 135–145)
Total Bilirubin: 0.7 mg/dL (ref 0.3–1.2)
Total Protein: 7.8 g/dL (ref 6.5–8.1)

## 2021-06-11 LAB — CBC WITH DIFFERENTIAL/PLATELET
Abs Immature Granulocytes: 0.01 10*3/uL (ref 0.00–0.07)
Basophils Absolute: 0 10*3/uL (ref 0.0–0.1)
Basophils Relative: 0 %
Eosinophils Absolute: 0.3 10*3/uL (ref 0.0–0.5)
Eosinophils Relative: 4 %
HCT: 36.3 % — ABNORMAL LOW (ref 39.0–52.0)
Hemoglobin: 12.1 g/dL — ABNORMAL LOW (ref 13.0–17.0)
Immature Granulocytes: 0 %
Lymphocytes Relative: 42 %
Lymphs Abs: 3.2 10*3/uL (ref 0.7–4.0)
MCH: 30.3 pg (ref 26.0–34.0)
MCHC: 33.3 g/dL (ref 30.0–36.0)
MCV: 91 fL (ref 80.0–100.0)
Monocytes Absolute: 0.8 10*3/uL (ref 0.1–1.0)
Monocytes Relative: 11 %
Neutro Abs: 3.2 10*3/uL (ref 1.7–7.7)
Neutrophils Relative %: 43 %
Platelets: 163 10*3/uL (ref 150–400)
RBC: 3.99 MIL/uL — ABNORMAL LOW (ref 4.22–5.81)
RDW: 13.2 % (ref 11.5–15.5)
WBC: 7.6 10*3/uL (ref 4.0–10.5)
nRBC: 0 % (ref 0.0–0.2)

## 2021-06-11 LAB — GLUCOSE, CAPILLARY
Glucose-Capillary: 130 mg/dL — ABNORMAL HIGH (ref 70–99)
Glucose-Capillary: 148 mg/dL — ABNORMAL HIGH (ref 70–99)
Glucose-Capillary: 160 mg/dL — ABNORMAL HIGH (ref 70–99)
Glucose-Capillary: 112 mg/dL — ABNORMAL HIGH (ref 70–99)
Glucose-Capillary: 116 mg/dL — ABNORMAL HIGH (ref 70–99)

## 2021-06-11 NOTE — Progress Notes (Signed)
?PROGRESS NOTE ? ?Andra Marschner  ?DOB: 1985/06/27  ?PCP: Hoy RegisterNewlin, Enobong, MD ?RUE:[id number]:[id number]  ?DOA: 03/04/2021 ? LOS: 37 days  ?Hospital Day: 100 ? ?Brief narrative: ?Shirl HarrisHtee Fischman is a 36 y.o. male with PMH significant for DM2, orthostatic hypotension, severe depression with psychosis. ?On 03/04/2021, patient was brought to the ED by his friend with complaint of noncompliance to medications, not eating, not drinking, isolating himself in being nonverbal. ?He had psychiatric evaluation done.  Per psychiatry note, patient was hearing voices.  He lives in [place] with friends and he was able to perform his activities.  He has had multiple hospitalizations in the past for psychiatric issues.  He would stay controlled with medicines but once he stops taking his medications, his symptoms flares up.  Patient was recommended for inpatient psychiatric placement.  However because of lack of bed availability, patient waited in the ED for about 2 months.  He could not be discharged home because of no contact or care in the community available. ?While in the ED, he had hyperglycemia/hypoglycemic episodes, diarrhea, orthostatic hypotension requiring medication adjustment. ?On 05/05/2021, he was noted to have elevated LFTs, worsening orthostatics and hence he was admitted to hospitalist service. ?He is medically stable for the last several days and is currently just waiting for placement. ? ?Subjective: ?Patient was seen and examined this morning.  ?Sitting up in bed.  Not in distress.  No new symptoms.  ? ?Active Problems: ?  MDD (major depressive disorder), recurrent, severe, with psychosis (HCC) ?  Controlled type 2 diabetes mellitus without complication, with long-term current use of insulin (HCC) ?  Anemia ?  Physical deconditioning ?  Chronic Hepatitis B ?  Low back pain ?  Generalized weakness ?  ? ?Assessment and Plan: ?MDD (major depressive disorder), recurrent, severe, with psychosis (HCC) ?-Appreciate psychiatry  assistance ?-Continue lexapro, cogentin and Invega. ?-Risperidone discontinued 2/2 due to elevated LFTs ?-Pending psychiatry placement. ? ?Type 2 diabetes mellitus ?-A1c 5.5 on 05/14/2021 ?-Currently on Semglee 15 units daily, glipizide 5 mg daily and Tradjenta 5 mg daily. ?-Blood sugar level improving after glipizide dose was reduced from 10 mg to 5 mg daily on 3/9.  -Continue to monitor ?Recent Labs  ?Lab 06/10/21 ?1222 06/10/21 ?1615 06/10/21 ?2010 06/11/21 ?0803 06/11/21 ?1240  ?GLUCAP 183* 197* 94 130* 148*  ? ?Orthostatic hypotension  ?-likely 2/2 autonomic dysfunction ?-Intermittent orthostatic blood pressure drop requiring IV fluid ?-TSH normal, cortisol checked on 04/14/2021 low at 3.1 but cosyntropin stim test on 2/6 baseline cortisol 11 which ruled out adrenal insufficiency ?-continue midodrine and TED hose ? ?Low back pain ?-Controlled on prn Robaxin and Tylenol ?-Continue to encourage increased mobility especially outside of room to minimize back pain ?-06/03/2021, CT of pelvis and lumbar spine unremarkable and remained stable with no evidence of nerve root encroachment ?-Continue to encourage patient to get out of bed and mobilize to minimize back pain. ? ?Chronic Hepatitis B ?-Hepatitis panel on 05/05/2021 showed antigen positive hepatitis B.  CT abdomen pelvis did not show any solid liver mass. ?-GI consult appreciated.  GI recommended outpatient follow-up at [facility] hepatology clinic versus with ID here in [place].  ?-Although may be a candidate for hepatitis B treatment would need to be initiated in the outpatient setting with subsequent insurance authorization to avoid interruption in treatment.  Also has severe depression which may influence decision to initiate hepatitis B medical therapy. ?Recent Labs  ?Lab 06/11/21 ?0224  ?AST 47*  ?ALT 76*  ?ALKPHOS 55  ?BILITOT 0.7  ?  PROT 7.8  ?ALBUMIN 3.3*  ?PLT 163  ? ?Physical deconditioning ?-PT initially recommended SNF but patient mobility has  significantly improved although he still has some episodes of dizziness. ?-3/8, seen walking in the hallway with mobility specialist ? ?Chronic anemia ?-Iron panel: Ferritin 696.  B12 in December 2021: 750 and folate 16.5. ?-Hemoglobin stable in the 11 g range.  Outpatient follow-up ? ?Acute urinary retention ?-resolved as of 06/01/2021 ? ?Thrombocytopenia ?-resolved as of 06/01/2021 ?-Platelets stable and > 100,000 ? ?Goals of care ?  Code Status: Full Code  ? ? ?Mobility: Encourage ambulation ? ?Nutritional status:  ?Body mass index is 19.88 kg/m?.  ?  ?  ? ? ? ? ?Diet:  ?Diet Order   ? ?       ?  Diet Carb Modified Fluid consistency: Thin; Room service appropriate? No  Diet effective now       ?  ? ?  ?  ? ?  ? ? ?DVT prophylaxis:  ?Place TED hose Start: 05/09/21 2054 ?Place TED hose Start: 05/05/21 1520 ?  ?Antimicrobials: None ?Fluid: None currently ?Consultants: None currently ?Family Communication: None at bedside ? ?Status is: Inpatient ? ?Continue in-hospital care because: Pending placement ?Level of care: Med-Surg  ? ?Dispo: The patient is from: Home ?             Anticipated d/c is to: DTP ?             Patient currently is medically stable to d/c. ?  Difficult to place patient Yes ? ? ?Infusions:  ? sodium chloride    ? ? ?Scheduled Meds: ? benztropine  0.5 mg Oral BID  ? escitalopram  10 mg Oral Daily  ? feeding supplement (GLUCERNA SHAKE)  237 mL Oral TID BM  ? glipiZIDE  5 mg Oral QAC breakfast  ? insulin aspart  0-6 Units Subcutaneous TID WC  ? insulin glargine-yfgn  15 Units Subcutaneous Daily  ? linagliptin  5 mg Oral Daily  ? LORazepam  0.5 mg Oral QHS  ? magnesium hydroxide  30 mL Oral Once  ? midodrine  10 mg Oral TID WC  ? paliperidone  3 mg Oral QHS  ? polyethylene glycol  17 g Oral Daily  ? selenium sulfide   Topical Daily  ? senna-docusate  1 tablet Oral QHS  ? sodium chloride flush  3 mL Intravenous Q12H  ? thiamine  100 mg Oral Daily  ? ? ?PRN meds: ?sodium chloride, acetaminophen, [COMPLETED]  methocarbamol **FOLLOWED BY** methocarbamol, sodium chloride flush  ? ?Antimicrobials: ?Anti-infectives (From admission, onward)  ? ? None  ? ?  ? ? ?Objective: ?Vitals:  ? 06/11/21 0346 06/11/21 0802  ?BP: 130/68 101/74  ?Pulse: 88 (!) 101  ?Resp: 18 18  ?Temp: 97.9 ?F (36.6 ?C) 97.6 ?F (36.4 ?C)  ?SpO2: 100% 97%  ? ? ?Intake/Output Summary (Last 24 hours) at 06/11/2021 1359 ?Last data filed at 06/11/2021 0855 ?Gross per 24 hour  ?Intake 220 ml  ?Output --  ?Net 220 ml  ? ?Filed Weights  ? 05/18/21 0811  ?Weight: 49.3 kg  ? ?Weight change:  ?Body mass index is 19.88 kg/m?.  ? ?Physical Exam: ?General exam: Young male.  Not in physical distress. ?Skin: No rashes, lesions or ulcers. ?HEENT: Atraumatic, normocephalic, no obvious bleeding ?Lungs: Clear to auscultation bilaterally ?CVS: Regular rate and rhythm, no murmur ?GI/Abd soft, nontender, nondistended, bowel sound present ?CNS: Alert, awake, oriented to place ?Psychiatry: Sad affect ?Extremities: No pedal  edema, no calf tenderness ? ?Data Review: I have personally reviewed the laboratory data and studies available. ? ?F/u labs  ?Unresulted Labs (From admission, onward)  ? ? None  ? ?  ? ? ?Signed, ?Lorin Glass, MD ?[facility] ?06/11/2021 ? ? ? ? ? ? ? ? ? ? ?  ?

## 2021-06-12 DIAGNOSIS — R338 Other retention of urine: Secondary | ICD-10-CM | POA: Diagnosis not present

## 2021-06-12 LAB — GLUCOSE, CAPILLARY
Glucose-Capillary: 108 mg/dL — ABNORMAL HIGH (ref 70–99)
Glucose-Capillary: 180 mg/dL — ABNORMAL HIGH (ref 70–99)
Glucose-Capillary: 163 mg/dL — ABNORMAL HIGH (ref 70–99)
Glucose-Capillary: 188 mg/dL — ABNORMAL HIGH (ref 70–99)

## 2021-06-12 NOTE — Progress Notes (Signed)
?PROGRESS NOTE ? ?Garrett [name]  ?DOB: 09-10-1985  ?PCP: Hoy Register, MD ?POE:[id number]  ?DOA: 03/04/2021 ? LOS: 38 days  ?Hospital Day: 101 ? ?Brief narrative: ?Garrett [name] is a 36 y.o. male with PMH significant for DM2, orthostatic hypotension, severe depression with psychosis. ?On 03/04/2021, patient was brought to the ED by his friend with complaint of noncompliance to medications, not eating, not drinking, isolating himself in being nonverbal. ?He had psychiatric evaluation done.  Per psychiatry note, patient was hearing voices.  He lives in [place] with friends and he was able to perform his activities.  He has had multiple hospitalizations in the past for psychiatric issues.  He would stay controlled with medicines but once he stops taking his medications, his symptoms flares up.  Patient was recommended for inpatient psychiatric placement.  However because of lack of bed availability, patient waited in the ED for about 2 months.  He could not be discharged home because of no contact or care in the community available. ?While in the ED, he had hyperglycemia/hypoglycemic episodes, diarrhea, orthostatic hypotension requiring medication adjustment. ?On 05/05/2021, he was noted to have elevated LFTs, worsening orthostatics and hence he was admitted to hospitalist service. ?He is medically stable for the last several days and is currently just waiting for placement. ? ?Subjective: ?Patient was seen and examined this morning.  ?Sitting up in bed.  Not in distress.  No new symptoms.  ? ?Active Problems: ?  MDD (major depressive disorder), recurrent, severe, with psychosis (HCC) ?  Controlled type 2 diabetes mellitus without complication, with long-term current use of insulin (HCC) ?  Anemia ?  Physical deconditioning ?  Chronic Hepatitis B ?  Low back pain ?  Generalized weakness ?  ? ?Assessment and Plan: ?MDD (major depressive disorder), recurrent, severe, with psychosis (HCC) ?-Appreciate psychiatry  assistance ?-Continue lexapro, cogentin and Invega. ?-Risperidone discontinued 2/2 due to elevated LFTs ?-Pending psychiatry placement. ? ?Type 2 diabetes mellitus ?-A1c 5.5 on 05/14/2021 ?-Currently on Semglee 15 units daily, glipizide 5 mg daily and Tradjenta 5 mg daily. ?-Blood sugar level improving after glipizide dose was reduced from 10 mg to 5 mg daily on 3/9.  -Continue to monitor ?Recent Labs  ?Lab 06/11/21 ?1639 06/11/21 ?2011 06/11/21 ?2040 06/12/21 ?0751 06/12/21 ?1132  ?GLUCAP 160* 112* 116* 108* 180*  ? ? ?Orthostatic hypotension  ?-likely 2/2 autonomic dysfunction ?-Intermittent orthostatic blood pressure drop requiring IV fluid ?-TSH normal, cortisol checked on 04/14/2021 low at 3.1 but cosyntropin stim test on 2/6 baseline cortisol 11 which ruled out adrenal insufficiency ?-continue midodrine and TED hose ? ?Low back pain ?-Controlled on prn Robaxin and Tylenol ?-Continue to encourage increased mobility especially outside of room to minimize back pain ?-06/03/2021, CT of pelvis and lumbar spine unremarkable and remained stable with no evidence of nerve root encroachment ?-Continue to encourage patient to get out of bed and mobilize to minimize back pain. ? ?Chronic Hepatitis B ?-Hepatitis panel on 05/05/2021 showed antigen positive hepatitis B.  CT abdomen pelvis did not show any solid liver mass. ?-GI consult appreciated.  GI recommended outpatient follow-up at [facility] hepatology clinic versus with ID here in [place].  ?-Although may be a candidate for hepatitis B treatment would need to be initiated in the outpatient setting with subsequent insurance authorization to avoid interruption in treatment.  Also has severe depression which may influence decision to initiate hepatitis B medical therapy. ?Recent Labs  ?Lab 06/11/21 ?0224  ?AST 47*  ?ALT 76*  ?ALKPHOS 55  ?BILITOT  0.7  ?PROT 7.8  ?ALBUMIN 3.3*  ?PLT 163  ? ? ?Physical deconditioning ?-PT initially recommended SNF but patient mobility has  significantly improved although he still has some episodes of dizziness. ?-3/8, seen walking in the hallway with mobility specialist ? ?Chronic anemia ?-Iron panel: Ferritin 696.  B12 in December 2021: 750 and folate 16.5. ?-Hemoglobin stable in the 11 g range.  Outpatient follow-up ? ?Acute urinary retention ?-resolved as of 06/01/2021 ? ?Thrombocytopenia ?-resolved as of 06/01/2021 ?-Platelets stable and > 100,000 ? ?Goals of care ?  Code Status: Full Code  ? ? ?Mobility: Encourage ambulation ? ?Nutritional status:  ?Body mass index is 19.88 kg/m?.  ?  ?  ? ? ? ? ?Diet:  ?Diet Order   ? ?       ?  Diet Carb Modified Fluid consistency: Thin; Room service appropriate? No  Diet effective now       ?  ? ?  ?  ? ?  ? ? ?DVT prophylaxis:  ?Place TED hose Start: 05/09/21 2054 ?Place TED hose Start: 05/05/21 1520 ?  ?Antimicrobials: None ?Fluid: None currently ?Consultants: None currently ?Family Communication: None at bedside ? ?Status is: Inpatient ? ?Continue in-hospital care because: Pending placement ?Level of care: Med-Surg  ? ?Dispo: The patient is from: Home ?             Anticipated d/c is to: DTP ?             Patient currently is medically stable to d/c. ?  Difficult to place patient Yes ? ? ?Infusions:  ? sodium chloride    ? ? ?Scheduled Meds: ? benztropine  0.5 mg Oral BID  ? escitalopram  10 mg Oral Daily  ? feeding supplement (GLUCERNA SHAKE)  237 mL Oral TID BM  ? glipiZIDE  5 mg Oral QAC breakfast  ? insulin aspart  0-6 Units Subcutaneous TID WC  ? insulin glargine-yfgn  15 Units Subcutaneous Daily  ? linagliptin  5 mg Oral Daily  ? LORazepam  0.5 mg Oral QHS  ? magnesium hydroxide  30 mL Oral Once  ? midodrine  10 mg Oral TID WC  ? paliperidone  3 mg Oral QHS  ? polyethylene glycol  17 g Oral Daily  ? selenium sulfide   Topical Daily  ? senna-docusate  1 tablet Oral QHS  ? sodium chloride flush  3 mL Intravenous Q12H  ? thiamine  100 mg Oral Daily  ? ? ?PRN meds: ?sodium chloride, acetaminophen, [COMPLETED]  methocarbamol **FOLLOWED BY** methocarbamol, sodium chloride flush  ? ?Antimicrobials: ?Anti-infectives (From admission, onward)  ? ? None  ? ?  ? ? ?Objective: ?Vitals:  ? 06/11/21 2010 06/12/21 0519  ?BP: 128/87 (!) 129/91  ?Pulse: 92 91  ?Resp: 17 16  ?Temp: 97.8 ?F (36.6 ?C) 98.3 ?F (36.8 ?C)  ?SpO2: 98% 100%  ? ? ?Intake/Output Summary (Last 24 hours) at 06/12/2021 1520 ?Last data filed at 06/12/2021 2536 ?Gross per 24 hour  ?Intake 240 ml  ?Output --  ?Net 240 ml  ? ? ?Filed Weights  ? 05/18/21 0811  ?Weight: 49.3 kg  ? ?Weight change:  ?Body mass index is 19.88 kg/m?.  ? ?Physical Exam: ?General exam: Young male.  Not in physical distress. ?Skin: No rashes, lesions or ulcers. ?HEENT: Atraumatic, normocephalic, no obvious bleeding ?Lungs: Clear to auscultation bilaterally ?CVS: Regular rate and rhythm, no murmur ?GI/Abd soft, nontender, nondistended, bowel sound present ?CNS: Alert, awake, oriented to place ?Psychiatry: Sad  affect ?Extremities: No pedal edema, no calf tenderness ? ?Data Review: I have personally reviewed the laboratory data and studies available. ? ?F/u labs  ?Unresulted Labs (From admission, onward)  ? ? None  ? ?  ? ? ?Signed, ?Lorin GlassBinaya Haizel Gatchell, MD ?[facility] ?06/12/2021 ? ? ? ? ? ? ? ? ? ? ?  ?

## 2021-06-13 DIAGNOSIS — Z794 Long term (current) use of insulin: Secondary | ICD-10-CM | POA: Diagnosis not present

## 2021-06-13 DIAGNOSIS — G629 Polyneuropathy, unspecified: Secondary | ICD-10-CM | POA: Diagnosis not present

## 2021-06-13 DIAGNOSIS — E119 Type 2 diabetes mellitus without complications: Secondary | ICD-10-CM | POA: Diagnosis not present

## 2021-06-13 LAB — GLUCOSE, CAPILLARY
Glucose-Capillary: 196 mg/dL — ABNORMAL HIGH (ref 70–99)
Glucose-Capillary: 152 mg/dL — ABNORMAL HIGH (ref 70–99)
Glucose-Capillary: 127 mg/dL — ABNORMAL HIGH (ref 70–99)
Glucose-Capillary: 188 mg/dL — ABNORMAL HIGH (ref 70–99)

## 2021-06-13 NOTE — Progress Notes (Signed)
Progress Note   PatientDoc Mandala BDZ:[id number] DOB: 1985/11/21 DOA: 03/04/2021     39 DOS: the patient was seen and examined on 06/13/2021 Needs interpreter/speaks [name]   Brief hospital course: Garrett [name] is a 36 y.o. male with medical history significant of severe depression with psychosis, T2DM and orthostatic hypotension who had been holding in ED for about 2 months time waiting on placement. He was being cared for by members in the community, but they have returned home and he no longer has care. He has been boarding in ED with some hypoglycemic episodes, diarrhea (resolved after metformin adjusted) and some orthostatic hypotension that continues to persist despite stopping his antihypertensive medication as well as addition of SSRI/mood stabilizing drugs.  While in the ER he also had an isolated episode of hyperglycemia with serum glucose 503 with a normal anion gap.  Not consistent with DKA.  Labs were checked and he was noted to have significantly elevated LFTs and with concomitant orthostasis he was referred for admission.  Currently, social work continues to work on placement.  He continues to have significant orthostasis that is limiting his ability to ambulate.     Assessment and Plan: Controlled type 2 diabetes mellitus without complication, with long-term current use of insulin (HCC) Hga1c of 5.9 04/2021 HOME MEDS: Lantus 40 units daily and Metformin XR 500 mg daily Tightly controlled in setting of hypoglycemic events  Stopped metformin 2/2 diarrhea.   Continue Tradjenta and Glucotrol and Semglee CBGs remain well controlled  Patient requesting a snack before I left the room  Chronic Hepatitis B Appreciate GI assistance-suspect chronic Hep B with worsening of LFTs in the setting of ischemic injury during orthostasis vs. Drug induced liver injury (risperdal) After DC needs referral to ID for management of chronic hep B AFP tumor marker slightly elevated at 10 c/w acute  exacerbation and hepatitis process -CT abdomen without any solid liver masses GI recommends outpatient follow-up at [facility] hepatology clinic versus with ID here in [place]. Liver biopsy should be considered. -Although may be a candidate for hepatitis B treatment would need to be initiated in the outpatient setting with subsequent insurance authorization to avoid interruption in treatment.  Also has severe depression which may influence decision to initiate hepatitis B medical therapy.   Orthostatic hypotension likely 2/2 autonomic dysfunction-resolved as of 06/01/2021 Has had episodic orthostasis while in ED during the past 2 months and received intermittent IV fluids TSH normal, cortisol checked on 04/14/2021 low at 3.1 but cosyntropin stim test on 2/6 baseline cortisol 11 which ruled out adrenal insufficiency -continue TED hose -Cont Midodrine  MDD (major depressive disorder), recurrent, severe, with psychosis (HCC) Appreciate psychiatry assistance Continue lexapro and cogentin Risperidone discontinued 2/2 elevated LFTs and Invega initiated by the psychiatric team Hair appears greasy and unkempt.  When asked patient states he has shower supplies and has been showering.  Here also has dandruff so have ordered Selsun shampoo to use daily Hopefully once disability approved and patient can be assigned a case manager we can assist him in obtaining a smart phone using his disability funds.  Given his language barrier he is extremely socially isolated.  A smart phone would allow him to search the Internet in his language and fine entertainment as well as other social media to help connect him to the outside world.    Acute urinary retention-resolved as of 06/01/2021 Resolved  Anemia Iron panel: Ferritin 696.  B12 in December 2021: 750 and folate 16.5. Hemoglobin stable in  the 11 g range.  Outpatient follow-up  Thrombocytopenia (HCC)-resolved as of 06/01/2021 Platelets stable and >  100,000  Physical deconditioning PT initially recommended SNF but patient mobility has significantly improved although he still has some episodes of dizziness. Physical activity continues to be limited by fatigue. Pt spends most of his days sitting in the bed although able to toilet independently. Previously enc to walk outside of room (using translation svc) On 3/2 communication limited by lack of translating service noting app was being dated.  I was able to communicate with patient that he needed to get out of bed and walk in the hallway regularly.  Because he stays in the bed most of the time this is why he is tired-not sure he understood.  Plan to recommunicate this tomorrow when translating service available  A physical therapy consult is indicated based on the patients mobility assessment.   Mobility Assessment (last 72 hours)     Mobility Assessment     Row Name 05/30/21 2000 05/29/21 1925 05/29/21 0800 05/28/21 1955     Does patient have an order for bedrest or is patient medically unstable No - Continue assessment No - Continue assessment No - Continue assessment No - Continue assessment    What is the highest level of mobility based on the progressive mobility assessment? Level 6 (Walks independently in room and hall) - Balance while walking in room without assist - Complete Level 6 (Walks independently in room and hall) - Balance while walking in room without assist - Complete Level 6 (Walks independently in room and hall) - Balance while walking in room without assist - Complete Level 6 (Walks independently in room and hall) - Balance while walking in room without assist - Complete    Is the above level different from baseline mobility prior to current illness? No - Consider discontinuing PT/OT No - Consider discontinuing PT/OT No - Consider discontinuing PT/OT No - Consider discontinuing PT/OT              Low back pain Controlled on prn Robaxin and Tylenol Continue to  encourage increased mobility especially outside of room to minimize back pain Patient complaining of radicular pain.  CT of pelvis and lumbar spine unremarkable and remained stable with no evidence of nerve root encroachment Continue to encourage patient to get out of bed and mobilize to minimize back pain.  Elevated LFTs See Hepatitis B notation  Constipation Documented bowel movement on 3/1 Continue daily MiraLAX and Senokot Give one-time dose of milk of magnesia on 3/6    Subjective:  With use of a translator was able to determine that patient's back pain is well controlled.  Updated him that his diabetes is now well controlled.  Using the translator also had multiple questions regarding social history specifically as to whether he has been receiving visitors or if there is anyone in the community that he has contact with.  He stated no to both questions.  When I asked him if he was planning on returning to [place] he stated no.  As I was leaving the room he asked me for a snack  Physical Exam: Vitals:   06/12/21 1604 06/12/21 2010 06/13/21 0455 06/13/21 0740  BP: (!) 125/91 123/84 108/65 101/71  Pulse: 95 95 98 99  Resp: 18 16 16 17   Temp: 97.8 F (36.6 C) 98.4 F (36.9 C) 98.9 F (37.2 C) 98.3 F (36.8 C)  TempSrc:  Oral Oral   SpO2: 100% 98% 96% 96%  Weight:      Height:       General: Calm, NAD Respiratory: Lungs are CTA, normal respiratory effort, room air Cardiovascular: S1 S2, regular pulse. TED hose  Gastrointestinal: Abdomen is nondistended, soft and nontender. Normal bowel sounds heard. LBM 3/12 Neurological: Cranial nerves II through XII are grossly intact, patient able to move all extremities x4 with strength 5/5.  Sensation is intact Hair and skin: No rashes, lesions or ulcers.  Patient's hair is greasy and unkempt and has dandruff flakes throughout. Psychiatry: Judgement and insight intact.  Mood & affect flat  Data Reviewed: There are no new results to  review at this time.   DVT Prophylaxis   ., Place ted hose  Place ted hose    Family Communication:  Patient only-- use a foreign Presenter, broadcasting  Disposition: Remains inpatient appropriate because: Unsafe discharge plan-deconditioned with ambulation concerns primarily related to recurrent orthostasis-also uncontrolled diabetes at presentation; severe depression along with language barrier contributing to patient's ability for self-care-care be recommends short-term SNF for rehabilitation; also patient does not have a way to pay for SNF or rehab Please review any new documentation from CM/LCSW     Planned Discharge Destination:  Family care home/Rest home or ALF   COVID vaccination status:  Unknown  Consultants: Psychiatry Gastroenterology Procedures: Echocardiogram Antibiotics: None    Time spent: 15 minutes  Author: Junious Silk, NP 06/13/2021 12:37 PM  For on call review [email or web address].

## 2021-06-13 NOTE — Progress Notes (Signed)
Physical Therapy Treatment ?Patient Details ?Name: Garrett [name] ?MRN: [id number] ?DOB: [birth year] ?Today's Date: 06/13/2021 ? ? ?History of Present Illness Pt is a 36 y.o. male presenting to ED 03/04/21 with withdrawal from friends, lack of oral intake, possibly catatonic behavior; thought to be secondary to MDD. Per nsg pt sustained a fall with unsteady knees early on in his admission. Current issue of hypotension, symptomatic and admitted to floor on 05/05/21 with transaminitis and right sided abdominal pain. PMH includes DM, medication non-compliance, AMS, DKA, acute metabolic encephalopathy, MDD. ? ?  ?PT Comments  ? ? Pt continues to make progress towards goals with independence with ambulation in hall with increased gait speed this session, though not formally tested. Pt able to perform multiple bouts of side stepping, retro stepping, tandem stepping, and braiding to challenge and improve standing and walking balance with minimal LOB and pt able to self correct in any and all instances. Pt tolerated other standing therex/balance exercise well with no overt LOB. Pt continues to benefit from skilled PT services to progress toward functional mobility goals.  ?  ?Recommendations for follow up therapy are one component of a multi-disciplinary discharge planning process, led by the attending physician.  Recommendations may be updated based on patient status, additional functional criteria and insurance authorization. ? ?Follow Up Recommendations ? No PT follow up ?  ?  ?Assistance Recommended at Discharge PRN  ?Patient can return home with the following Assist for transportation;Assistance with cooking/housework;Direct supervision/assist for financial management;Direct supervision/assist for medications management ?  ?Equipment Recommendations ? None recommended by PT  ?  ?Recommendations for Other Services   ? ? ?  ?Precautions / Restrictions Precautions ?Precautions: None ?Precaution Comments: not orthostatic during  session today ?Restrictions ?Weight Bearing Restrictions: No  ?  ? ?Mobility ? Bed Mobility ?Overal bed mobility: Independent ?  ?  ?  ?  ?  ?  ?  ?  ? ?Transfers ?Overall transfer level: Independent ?  ?  ?  ?  ?  ?  ?  ?  ?  ?  ? ?Ambulation/Gait ?Ambulation/Gait assistance: Independent ?Gait Distance (Feet): 600 Feet ?Assistive device: None ?Gait Pattern/deviations: Step-through pattern, Narrow base of support ?Gait velocity: normal ?  ?  ?  ? ? ?Stairs ?  ?  ?  ?  ?  ? ? ?Wheelchair Mobility ?  ? ?Modified Rankin (Stroke Patients Only) ?  ? ? ?  ?Balance   ?  ?  ?  ?  ?Standing balance support: No upper extremity supported ?Standing balance-Leahy Scale: Good ?  ?  ?  ?  ?  ?  ?  ?  ?  ?  ?  ?  ?  ? ?  ?Cognition Arousal/Alertness: Awake/alert ?Behavior During Therapy: Flat affect ?Overall Cognitive Status: Difficult to assess ?  ?  ?  ?  ?  ?  ?  ?  ?  ?  ?  ?  ?  ?  ?  ?  ?  ?  ?  ? ?  ?Exercises Other Exercises ?Other Exercises: side stepping, braiding, retro stepping, tandem stepping, multiple bouts for balance challenge ?Other Exercises: SLS x5 ea side, able to hold for a few seconds ?Other Exercises: SL alternating step tap to 12" trash can x 20 ? ?  ?General Comments   ?  ?  ? ?Pertinent Vitals/Pain    ? ? ?Home Living   ?  ?  ?  ?  ?  ?  ?  ?  ?  ?   ?  ?  Prior Function    ?  ?  ?   ? ?PT Goals (current goals can now be found in the care plan section) Acute Rehab PT Goals ?Patient Stated Goal: none reported ?PT Goal Formulation: With patient ?Time For Goal Achievement: 05/31/21 ? ?  ?Frequency ? ? ? Min 1X/week ? ? ? ?  ?PT Plan Current plan remains appropriate  ? ? ?Co-evaluation   ?  ?  ?  ?  ? ?  ?AM-PAC PT "6 Clicks" Mobility   ?Outcome Measure ? Help needed turning from your back to your side while in a flat bed without using bedrails?: None ?Help needed moving from lying on your back to sitting on the side of a flat bed without using bedrails?: None ?Help needed moving to and from a bed to a chair  (including a wheelchair)?: None ?Help needed standing up from a chair using your arms (e.g., wheelchair or bedside chair)?: None ?Help needed to walk in hospital room?: None ?Help needed climbing 3-5 steps with a railing? : A Little ?6 Click Score: 23 ? ?  ?End of Session Equipment Utilized During Treatment: Gait belt ?Activity Tolerance: Patient tolerated treatment well ?Patient left: in bed;with call bell/phone within reach ?Nurse Communication: Mobility status ?PT Visit Diagnosis: Adult, failure to thrive (R62.7);Muscle weakness (generalized) (M62.81);History of falling (Z91.81);Other abnormalities of gait and mobility (R26.89) ?  ? ? ?Time: 2671-2458 ?PT Time Calculation (min) (ACUTE ONLY): 18 min ? ?Charges:  $Therapeutic Exercise: 8-22 mins          ?          ? ?Lenora Boys. PTA ?Acute Rehabilitation Services ?Office: [phone] ? ? ? ?Marlana Salvage Juliann Olesky ?06/13/2021, 3:03 PM ? ?

## 2021-06-13 NOTE — Progress Notes (Signed)
Mobility Specialist: Progress Note ? ? 06/13/21 1639  ?Mobility  ?Activity Ambulated independently in hallway  ?Level of Assistance Independent  ?Assistive Device None  ?Distance Ambulated (ft) 550 ft  ?Activity Response Tolerated well  ?$Mobility charge 1 Mobility  ? ?Received pt in BR having no complaints and agreeable to mobility. Asymptomatic throughout ambulation, returned back to bed w/ call bell in reach and all needs met. ? ?Harrell Gave Quoc Tome ?Mobility Specialist ?Mobility Specialist [facility]: [phone] ?Mobility Specialist [place]: [phone] ? ?

## 2021-06-14 DIAGNOSIS — R5381 Other malaise: Secondary | ICD-10-CM | POA: Diagnosis not present

## 2021-06-14 DIAGNOSIS — F333 Major depressive disorder, recurrent, severe with psychotic symptoms: Secondary | ICD-10-CM | POA: Diagnosis not present

## 2021-06-14 DIAGNOSIS — E119 Type 2 diabetes mellitus without complications: Secondary | ICD-10-CM | POA: Diagnosis not present

## 2021-06-14 DIAGNOSIS — Z794 Long term (current) use of insulin: Secondary | ICD-10-CM | POA: Diagnosis not present

## 2021-06-14 LAB — GLUCOSE, CAPILLARY
Glucose-Capillary: 118 mg/dL — ABNORMAL HIGH (ref 70–99)
Glucose-Capillary: 143 mg/dL — ABNORMAL HIGH (ref 70–99)
Glucose-Capillary: 181 mg/dL — ABNORMAL HIGH (ref 70–99)

## 2021-06-14 NOTE — Progress Notes (Signed)
?Progress Note ? ? ?PatientReilly [name] VZD:[id number] DOB: May 09, 1985 DOA: 03/04/2021     40 ?DOS: the patient was seen and examined on 06/14/2021 ?Needs interpreter/speaks [name] ?  ?Brief hospital course: ?Garrett [name] is a 36 y.o. male with medical history significant of severe depression with psychosis, T2DM and orthostatic hypotension who had been holding in ED for about 2 months time waiting on placement. He was being cared for by members in the community, but they have returned home and he no longer has care. He has been boarding in ED with some hypoglycemic episodes, diarrhea (resolved after metformin adjusted) and some orthostatic hypotension that continues to persist despite stopping his antihypertensive medication as well as addition of SSRI/mood stabilizing drugs.  While in the ER he also had an isolated episode of hyperglycemia with serum glucose 503 with a normal anion gap.  Not consistent with DKA.  Labs were checked and he was noted to have significantly elevated LFTs and with concomitant orthostasis he was referred for admission.  Currently, social work continues to work on placement.  He continues to have significant orthostasis that is limiting his ability to ambulate. ?  ? ? ?Assessment and Plan: ?Controlled type 2 diabetes mellitus without complication, with long-term current use of insulin (HCC) ?Hga1c of 5.9 04/2021 ?HOME MEDS: Lantus 40 units daily and Metformin XR 500 mg daily ?Stopped metformin 2/2 diarrhea.   ?Continue Tradjenta, Glucotrol and Semglee ?CBGs remain well controlled  ? ? ?Chronic Hepatitis B ?Appreciate GI assistance-suspect chronic Hep B with worsening of LFTs in the setting of ischemic injury during orthostasis vs. Drug induced liver injury (risperdal) ?After DC needs referral to ID for management of chronic hep B ?AFP tumor marker slightly elevated at 10 c/w acute exacerbation and hepatitis process -CT abdomen without any solid liver masses ?GI recommends outpatient follow-up at  [facility] hepatology clinic versus with ID here in [place]. Liver biopsy should be considered. ?-Although may be a candidate for hepatitis B treatment would need to be initiated in the outpatient setting with subsequent insurance authorization to avoid interruption in treatment.  Also has severe depression which may influence decision to initiate hepatitis B medical therapy. ? ? ?Orthostatic hypotension likely 2/2 autonomic dysfunction-resolved as of 06/01/2021 ?Has had episodic orthostasis while in ED during the past 2 months and received intermittent IV fluids ?TSH normal, cortisol checked on 04/14/2021 low at 3.1 but cosyntropin stim test on 2/6 baseline cortisol 11 which ruled out adrenal insufficiency ?-continue TED hose ?-Cont Midodrine ? ?MDD (major depressive disorder), recurrent, severe, with psychosis (HCC) ?Appreciate psychiatry assistance ?Continue lexapro and cogentin ?Risperidone discontinued 2/2 elevated LFTs and Invega initiated by the psychiatric team ?Social isolation due to language barrier remains a significant problem and is likely influencing his depression ? ? ? ?Acute urinary retention-resolved as of 06/01/2021 ?Resolved ? ?Anemia ?Iron panel: Ferritin 696.  B12 in December 2021: 750 and folate 16.5. ?Hemoglobin stable in the 11 g range.  Outpatient follow-up ? ?Thrombocytopenia (HCC)-resolved as of 06/01/2021 ?Platelets stable and > 100,000 ? ?Physical deconditioning ?PT initially recommended SNF but patient mobility has significantly improved although he still has some episodes of dizziness. ?Physical activity continues to be limited by fatigue. Pt spends most of his days sitting in the bed although able to toilet independently. Previously enc to walk outside of room (using translation svc) ?On 3/2 communication limited by lack of translating service noting app was being dated.  I was able to communicate with patient that he needed to  get out of bed and walk in the hallway regularly.  Because he  stays in the bed most of the time this is why he is tired-not sure he understood.  Plan to recommunicate this tomorrow when translating service available ? ?A physical therapy consult is indicated based on the patient?s mobility assessment. ? ? ?Mobility Assessment (last 72 hours)   ? ? Mobility Assessment   ? ? Row Name 05/30/21 2000 05/29/21 1925 05/29/21 0800 05/28/21 1955  ?  ? Does patient have an order for bedrest or is patient medically unstable No - Continue assessment No - Continue assessment No - Continue assessment No - Continue assessment   ? What is the highest level of mobility based on the progressive mobility assessment? Level 6 (Walks independently in room and hall) - Balance while walking in room without assist - Complete Level 6 (Walks independently in room and hall) - Balance while walking in room without assist - Complete Level 6 (Walks independently in room and hall) - Balance while walking in room without assist - Complete Level 6 (Walks independently in room and hall) - Balance while walking in room without assist - Complete   ? Is the above level different from baseline mobility prior to current illness? No - Consider discontinuing PT/OT No - Consider discontinuing PT/OT No - Consider discontinuing PT/OT No - Consider discontinuing PT/OT   ? ?  ?  ? ?  ? ? ? ?Low back pain ?Controlled on prn Robaxin and Tylenol ?Continue to encourage increased mobility especially outside of room to minimize back pain ?Patient complaining of radicular pain.  CT of pelvis and lumbar spine unremarkable and remained stable with no evidence of nerve root encroachment ?Continue to encourage patient to get out of bed and mobilize to minimize back pain. ? ?Elevated LFTs ?See Hepatitis B notation ? ?Constipation ?Documented bowel movement on 3/1 ?Continue daily MiraLAX and Senokot ?Give one-time dose of milk of magnesia on 3/6 ? ? ? ?Subjective:  ?Sleeping and was able to convey that he wanted to remain asleep for a  little while longer. ? ?Physical Exam: ?Vitals:  ? 06/13/21 1515 06/13/21 2100 06/14/21 0525 06/14/21 0742  ?BP: 125/90 127/87 119/79 100/67  ?Pulse: (!) 106 93 91 94  ?Resp: 20 18 18 18   ?Temp: 98.3 ?F (36.8 ?C) 97.7 ?F (36.5 ?C) (!) 97.3 ?F (36.3 ?C) 98 ?F (36.7 ?C)  ?TempSrc:  Oral Oral Oral  ?SpO2: 98% 99% 99% 97%  ?Weight:      ?Height:      ? ?General: Calm, NAD ?Respiratory: Lungs are CTA, normal respiratory effort, room air ?Cardiovascular: S1 S2, regular pulse. TED hose  ?Gastrointestinal: Abdomen is nondistended, soft and nontender.  Positive bowel sounds noted.  LBM 3/12 ?Neurological: Cranial nerves II through XII are grossly intact, patient able to move all extremities x4 with strength 5/5.  Sensation is intact ?Hair and skin: No rashes, lesions or ulcers.  Patient's hair is greasy and unkempt and has dandruff flakes throughout. ?Psychiatry: Judgement and insight intact.  Mood & affect flat ? ?Data Reviewed: ?There are no new results to review at this time. ? ? DVT Prophylaxis   ?., Place ted hose  ?Place ted hose  ? ? ?Family Communication:  ?Patient only-- use a foreign Presenter, broadcastinglanguage translator ? ?Disposition: ?Remains inpatient appropriate because: Unsafe discharge plan-deconditioned with ambulation concerns primarily related to recurrent orthostasis-also uncontrolled diabetes at presentation; severe depression along with language barrier contributing to patient's ability for self-care-care be  recommends short-term SNF for rehabilitation; also patient does not have a way to pay for SNF or rehab ?Please review any new documentation from CM/LCSW ? ? ? ? ?Planned Discharge Destination:  ?Family care home/Rest home or ALF ? ? ?COVID vaccination status:  ?Unknown ? ?Consultants: ?Psychiatry ?Gastroenterology ?Procedures: ?Echocardiogram ?Antibiotics: ?None ? ? ? ?Time spent: 15 minutes ? ?Author: ?Junious Silk, NP ?06/14/2021 12:37 PM ? ?For on call review [email or web address].  ? ?

## 2021-06-14 NOTE — Progress Notes (Signed)
Mobility Specialist Progress Note: ? ? 06/14/21 1008  ?Mobility  ?Activity Ambulated with assistance in hallway  ?Level of Assistance Independent  ?Assistive Device None  ?Distance Ambulated (ft) 550 ft  ?Activity Response Tolerated well  ?$Mobility charge 1 Mobility  ? ?Pt received in bed willing to participate in mobility. No complaints of pain this session. Left in bed with call bell in reach and all needs met. ? ?Violeta Lecount ?Mobility Specialist ?Primary Phone [phone] ? ?

## 2021-06-14 NOTE — Progress Notes (Signed)
Mobility Specialist Progress Note: ? ? 06/14/21 1625  ?Mobility  ?Activity Ambulated with assistance in hallway  ?Level of Assistance Independent  ?Assistive Device None  ?Distance Ambulated (ft) 750 ft  ?Activity Response Tolerated well  ?$Mobility charge 1 Mobility  ? ?Second walk of the Garrett [name]. Complaints of foot and back pain. Left in bed with call bell in reach and all needs met.  ? ?Garrett [name] ?Mobility Specialist ?Primary Phone [phone] ? ?

## 2021-06-14 NOTE — Progress Notes (Signed)
CSW spoke with Helmut Muster of the [facility] who states she received documents from [facility] that need to be completed. Helmut Muster will send those documents to CSW to provide to the patient for completion. ? ?Edwin Dada, MSW, LCSW ?Transitions of Care  Clinical Social Worker II ?[phone] ? ?

## 2021-06-15 LAB — GLUCOSE, CAPILLARY
Glucose-Capillary: 159 mg/dL — ABNORMAL HIGH (ref 70–99)
Glucose-Capillary: 109 mg/dL — ABNORMAL HIGH (ref 70–99)
Glucose-Capillary: 179 mg/dL — ABNORMAL HIGH (ref 70–99)
Glucose-Capillary: 145 mg/dL — ABNORMAL HIGH (ref 70–99)
Glucose-Capillary: 137 mg/dL — ABNORMAL HIGH (ref 70–99)

## 2021-06-15 NOTE — Progress Notes (Signed)
Mobility Specialist Progress Note: ? ? 06/15/21 1139  ?Mobility  ?Activity Ambulated with assistance in hallway  ?Level of Assistance Independent  ?Assistive Device None  ?Distance Ambulated (ft) 750 ft  ?Activity Response Tolerated well  ?$Mobility charge 1 Mobility  ? ?Pt received in bed willing to participate in mobility. Complaints of foot pain. Left in bed with call bell in reach and all needs met.  ? ?  ?Mobility Specialist ?Primary Phone [phone] ? ?

## 2021-06-15 NOTE — Progress Notes (Signed)
?PROGRESS NOTE ? ?Garrett [name]  ?DOB: 1985-06-05  ?PCP: Charlott Rakes, MD ?[id number]  ?DOA: 03/04/2021 ? LOS: 41 days  ?Hospital Day: 104 ? ?Brief narrative: ?Garrett [name] is a 36 y.o. male with PMH significant for DM2, orthostatic hypotension, severe depression with psychosis. ?On 03/04/2021, patient was brought to the ED by his friend with complaint of noncompliance to medications, not eating, not drinking, isolating himself in being nonverbal. ?He had psychiatric evaluation done.  Per psychiatry note, patient was hearing voices.  He lives in [place] with friends and he was able to perform his activities.  He has had multiple hospitalizations in the past for psychiatric issues.  He would stay controlled with medicines but once he stops taking his medications, his symptoms flares up.  Patient was recommended for inpatient psychiatric placement.  However because of lack of bed availability, patient waited in the ED for about 2 months.  He could not be discharged home because of no contact or care in the community available. ?While in the ED, he had hyperglycemia/hypoglycemic episodes, diarrhea, orthostatic hypotension requiring medication adjustment. ?On 05/05/2021, he was noted to have elevated LFTs, worsening orthostatics and hence he was admitted to hospitalist service. ?He is medically stable for the last several days and is currently just waiting for placement. ? ?Subjective: ?Patient was seen and examined this morning.  ?Sitting up in bed.  Not in distress.  No new symptoms.  ? ?Active Problems: ?  MDD (major depressive disorder), recurrent, severe, with psychosis ([name]) ?  Controlled type 2 diabetes mellitus without complication, with long-term current use of insulin ([place]) ?  Anemia ?  Physical deconditioning ?  Chronic Hepatitis B ?  Low back pain ?  Generalized weakness ?  ? ?Assessment and Plan: ?MDD (major depressive disorder), recurrent, severe, with psychosis ([name]) ?-Appreciate psychiatry  assistance ?-Continue lexapro, cogentin and Invega. ?-Risperidone discontinued 2/2 due to elevated LFTs ?-Pending psychiatry placement. ? ?Type 2 diabetes mellitus ?-A1c 5.5 on 05/14/2021 ?-Currently on Semglee 15 units daily, glipizide 5 mg daily and Tradjenta 5 mg daily. ?-Blood sugar level remains consistently less than 200. ?Recent Labs  ?Lab 06/14/21 ?1136 06/14/21 ?1609 06/15/21 ?0102 06/15/21 ?[id number] 06/15/21 ?1201  ?GLUCAP 143* 181* 159* 109* 179*  ? ?Orthostatic hypotension  ?-likely 2/2 autonomic dysfunction ?-Intermittent orthostatic blood pressure drop requiring IV fluid ?-TSH normal, cortisol checked on 04/14/2021 low at 3.1 but cosyntropin stim test on 2/6 baseline cortisol 11 which ruled out adrenal insufficiency ?-continue midodrine and TED hose ? ?Low back pain ?-Controlled on prn Robaxin and Tylenol ?-Continue to encourage increased mobility especially outside of room to minimize back pain ?-06/03/2021, CT of pelvis and lumbar spine unremarkable and remained stable with no evidence of nerve root encroachment ?-Continue to encourage patient to get out of bed and mobilize to minimize back pain. ? ?Chronic Hepatitis B ?-Hepatitis panel on 05/05/2021 showed antigen positive hepatitis B.  CT abdomen pelvis did not show any solid liver mass. ?-GI consult appreciated.  GI recommended outpatient follow-up at [facility] hepatology clinic versus with ID here in [place].  ?-Although may be a candidate for hepatitis B treatment would need to be initiated in the outpatient setting with subsequent insurance authorization to avoid interruption in treatment.  Also has severe depression which may influence decision to initiate hepatitis B medical therapy. ?Recent Labs  ?Lab 06/11/21 ?0224  ?AST 47*  ?ALT 76*  ?ALKPHOS 55  ?BILITOT 0.7  ?PROT 7.8  ?ALBUMIN 3.3*  ?PLT 163  ? ?Physical deconditioning ?-PT  initially recommended SNF but patient mobility has significantly improved although he still has some episodes of  dizziness. ? ?Chronic anemia ?-Iron panel: Ferritin 696.  B12 in December 2021: 750 and folate 16.5. ?-Hemoglobin stable in the 11 g range.  Outpatient follow-up ? ?Acute urinary retention ?-resolved as of 06/01/2021 ? ?Thrombocytopenia ?-resolved as of 06/01/2021 ?-Platelets stable and > 100,000 ? ?Goals of care ?  Code Status: Full Code  ? ? ?Mobility: Encourage ambulation ? ?Nutritional status:  ?Body mass index is 19.88 kg/m?.  ?  ?  ? ? ? ? ?Diet:  ?Diet Order   ? ?       ?  Diet Carb Modified Fluid consistency: Thin; Room service appropriate? No  Diet effective now       ?  ? ?  ?  ? ?  ? ? ?DVT prophylaxis:  ?Place TED hose Start: 05/09/21 2054 ?Place TED hose Start: 05/05/21 1520 ?  ?Antimicrobials: None ?Fluid: None currently ?Consultants: None currently ?Family Communication: None at bedside ? ?Status is: Inpatient ? ?Continue in-hospital care because: Pending placement ?Level of care: Med-Surg  ? ?Dispo: The patient is from: Home ?             Anticipated d/c is to: DTP.  ?             Patient currently is medically stable to d/c. ?  Difficult to place patient Yes ? ? ?Infusions:  ? sodium chloride    ? ? ?Scheduled Meds: ? benztropine  0.5 mg Oral BID  ? escitalopram  10 mg Oral Daily  ? feeding supplement (GLUCERNA SHAKE)  237 mL Oral TID BM  ? glipiZIDE  5 mg Oral QAC breakfast  ? insulin aspart  0-6 Units Subcutaneous TID WC  ? insulin glargine-yfgn  15 Units Subcutaneous Daily  ? linagliptin  5 mg Oral Daily  ? LORazepam  0.5 mg Oral QHS  ? magnesium hydroxide  30 mL Oral Once  ? midodrine  10 mg Oral TID WC  ? paliperidone  3 mg Oral QHS  ? polyethylene glycol  17 g Oral Daily  ? selenium sulfide   Topical Daily  ? senna-docusate  1 tablet Oral QHS  ? sodium chloride flush  3 mL Intravenous Q12H  ? thiamine  100 mg Oral Daily  ? ? ?PRN meds: ?sodium chloride, acetaminophen, [COMPLETED] methocarbamol **FOLLOWED BY** methocarbamol, sodium chloride flush  ? ?Antimicrobials: ?Anti-infectives (From  admission, onward)  ? ? None  ? ?  ? ? ?Objective: ?Vitals:  ? 06/15/21 0358 06/15/21 0748  ?BP: 98/65 98/67  ?Pulse: 76 100  ?Resp: 18 16  ?Temp: 99.1 ?F (37.3 ?C) 98.8 ?F (37.1 ?C)  ?SpO2: 98% 98%  ? ? ?Intake/Output Summary (Last 24 hours) at 06/15/2021 1321 ?Last data filed at 06/15/2021 0900 ?Gross per 24 hour  ?Intake 1323 ml  ?Output --  ?Net 1323 ml  ? ?Filed Weights  ? 05/18/21 0811  ?Weight: 49.3 kg  ? ?Weight change:  ?Body mass index is 19.88 kg/m?.  ? ?Physical Exam: ?General exam: Young male.  Not in physical distress. ?Skin: No rashes, lesions or ulcers. ?HEENT: Atraumatic, normocephalic, no obvious bleeding ?Lungs: Clear to auscultation bilaterally ?CVS: Regular rate and rhythm, no murmur ?GI/Abd soft, nontender, nondistended, bowel sound present ?CNS: Alert, awake, oriented to place ?Psychiatry: Sad affect ?Extremities: No pedal edema, no calf tenderness ? ?Data Review: I have personally reviewed the laboratory data and studies available. ? ?F/u labs  ?Unresulted  Labs (From admission, onward)  ? ? None  ? ?  ? ? ?Signed, ?Terrilee Croak, MD ?[facility] ?06/15/2021 ? ? ? ? ? ? ? ? ? ? ?  ?

## 2021-06-15 NOTE — Progress Notes (Signed)
Mobility Specialist Progress Note: ? ? 06/15/21 1551  ?Mobility  ?Activity Ambulated with assistance in hallway  ?Level of Assistance Independent  ?Assistive Device None  ?Distance Ambulated (ft) 1120 ft  ?Activity Response Tolerated well  ?$Mobility charge 1 Mobility  ? ?Second walk of the [place]. No complaints of pain. Left in bed with call bell in reach and all needs met.  ? ?Ausha Sieh ?Mobility Specialist ?Primary Phone [phone] ? ?

## 2021-06-16 LAB — GLUCOSE, CAPILLARY
Glucose-Capillary: 113 mg/dL — ABNORMAL HIGH (ref 70–99)
Glucose-Capillary: 113 mg/dL — ABNORMAL HIGH (ref 70–99)
Glucose-Capillary: 199 mg/dL — ABNORMAL HIGH (ref 70–99)
Glucose-Capillary: 124 mg/dL — ABNORMAL HIGH (ref 70–99)

## 2021-06-16 NOTE — Progress Notes (Signed)
?PROGRESS NOTE ? ?Garrett [name]  ?DOB: [birth year]  ?PCP: Hoy Register, MD ?[id number]  ?DOA: 03/04/2021 ? LOS: 42 days  ?Hospital Day: 105 ? ?Brief narrative: ?Garrett [name] is a 36 y.o. male with PMH significant for DM2, orthostatic hypotension, severe depression with psychosis. ?On 03/04/2021, patient was brought to the ED by his friend with complaint of noncompliance to medications, not eating, not drinking, isolating himself in being nonverbal. ?He had psychiatric evaluation done.  Per psychiatry note, patient was hearing voices.  He lives in [place] with friends and he was able to perform his activities.  He has had multiple hospitalizations in the past for psychiatric issues.  He would stay controlled with medicines but once he stops taking his medications, his symptoms flares up.  Patient was recommended for inpatient psychiatric placement.  However because of lack of bed availability, patient waited in the ED for about 2 months.  He could not be discharged home because of no contact or care in the community available. ?While in the ED, he had hyperglycemia/hypoglycemic episodes, diarrhea, orthostatic hypotension requiring medication adjustment. ?On 05/05/2021, he was noted to have elevated LFTs, worsening orthostatics and hence he was admitted to hospitalist service. ?He is medically stable for the last several days and is currently just waiting for placement. ? ?Subjective: ?Patient was seen and examined this morning.  ?Sitting up in bed.  Not in distress.  No new symptoms.  ? ?Active Problems: ?  MDD (major depressive disorder), recurrent, severe, with psychosis ([facility]) ?  Controlled type 2 diabetes mellitus without complication, with long-term current use of insulin ([facility]) ?  Anemia ?  Physical deconditioning ?  Chronic Hepatitis B ?  Low back pain ?  Generalized weakness ?  ? ?Assessment and Plan: ?MDD (major depressive disorder), recurrent, severe, with psychosis ([facility]) ?-Appreciate psychiatry  assistance ?-Continue lexapro, cogentin and Invega. ?-Risperidone discontinued 2/2 due to elevated LFTs ?-Pending psychiatry placement. ? ?Type 2 diabetes mellitus ?-A1c 5.5 on 05/14/2021 ?-Currently on Semglee 15 units daily, glipizide 5 mg daily and Tradjenta 5 mg daily. ?-Blood sugar level remains consistently less than 200. ?Recent Labs  ?Lab 06/15/21 ?1201 06/15/21 ?1653 06/15/21 ?2159 06/16/21 ?0800 06/16/21 ?1204  ?GLUCAP 179* 145* 137* 113* 113*  ? ? ?Orthostatic hypotension  ?-likely 2/2 autonomic dysfunction ?-Intermittent orthostatic blood pressure drop requiring IV fluid ?-TSH normal, cortisol checked on 04/14/2021 low at 3.1 but cosyntropin stim test on 2/6 baseline cortisol 11 which ruled out adrenal insufficiency ?-continue midodrine and TED hose ? ?Low back pain ?-Controlled on prn Robaxin and Tylenol ?-Continue to encourage increased mobility especially outside of room to minimize back pain ?-06/03/2021, CT of pelvis and lumbar spine unremarkable and remained stable with no evidence of nerve root encroachment ?-Continue to encourage patient to get out of bed and mobilize to minimize back pain. ? ?Chronic Hepatitis B ?-Hepatitis panel on 05/05/2021 showed antigen positive hepatitis B.  CT abdomen pelvis did not show any solid liver mass. ?-GI consult appreciated.  GI recommended outpatient follow-up at [facility] hepatology clinic versus with ID here in [place].  ?-Although may be a candidate for hepatitis B treatment would need to be initiated in the outpatient setting with subsequent insurance authorization to avoid interruption in treatment.  Also has severe depression which may influence decision to initiate hepatitis B medical therapy. ?Recent Labs  ?Lab 06/11/21 ?0224  ?AST 47*  ?ALT 76*  ?ALKPHOS 55  ?BILITOT 0.7  ?PROT 7.8  ?ALBUMIN 3.3*  ?PLT 163  ? ? ?Physical  deconditioning ?-PT initially recommended SNF but patient mobility has significantly improved although he still has some episodes of  dizziness. ? ?Chronic anemia ?-Iron panel: Ferritin 696.  B12 in December 2021: 750 and folate 16.5. ?-Hemoglobin stable in the 11 g range.  Outpatient follow-up ? ?Acute urinary retention ?-resolved as of 06/01/2021 ? ?Thrombocytopenia ?-resolved as of 06/01/2021 ?-Platelets stable and > 100,000 ? ?Goals of care ?  Code Status: Full Code  ? ? ?Mobility: Encourage ambulation ? ?Nutritional status:  ?Body mass index is 19.88 kg/m?.  ?  ?  ? ? ? ? ?Diet:  ?Diet Order   ? ?       ?  Diet Carb Modified Fluid consistency: Thin; Room service appropriate? No  Diet effective now       ?  ? ?  ?  ? ?  ? ? ?DVT prophylaxis:  ?Place TED hose Start: 05/09/21 2054 ?Place TED hose Start: 05/05/21 1520 ?  ?Antimicrobials: None ?Fluid: None currently ?Consultants: None currently ?Family Communication: None at bedside ? ?Status is: Inpatient ? ?Continue in-hospital care because: Pending placement ?Level of care: Med-Surg  ? ?Dispo: The patient is from: Home ?             Anticipated d/c is to: DTP.  ?             Patient currently is medically stable to d/c. ?  Difficult to place patient Yes ? ? ?Infusions:  ? sodium chloride    ? ? ?Scheduled Meds: ? benztropine  0.5 mg Oral BID  ? escitalopram  10 mg Oral Daily  ? feeding supplement (GLUCERNA SHAKE)  237 mL Oral TID BM  ? glipiZIDE  5 mg Oral QAC breakfast  ? insulin aspart  0-6 Units Subcutaneous TID WC  ? insulin glargine-yfgn  15 Units Subcutaneous Daily  ? linagliptin  5 mg Oral Daily  ? LORazepam  0.5 mg Oral QHS  ? magnesium hydroxide  30 mL Oral Once  ? midodrine  10 mg Oral TID WC  ? paliperidone  3 mg Oral QHS  ? polyethylene glycol  17 g Oral Daily  ? selenium sulfide   Topical Daily  ? senna-docusate  1 tablet Oral QHS  ? sodium chloride flush  3 mL Intravenous Q12H  ? thiamine  100 mg Oral Daily  ? ? ?PRN meds: ?sodium chloride, acetaminophen, [COMPLETED] methocarbamol **FOLLOWED BY** methocarbamol, sodium chloride flush  ? ?Antimicrobials: ?Anti-infectives (From  admission, onward)  ? ? None  ? ?  ? ? ?Objective: ?Vitals:  ? 06/16/21 0507 06/16/21 0803  ?BP: 106/79 113/74  ?Pulse: 89 90  ?Resp: 17 16  ?Temp: (!) 97.3 ?F (36.3 ?C) 97.6 ?F (36.4 ?C)  ?SpO2: 98% 99%  ? ? ?Intake/Output Summary (Last 24 hours) at 06/16/2021 1313 ?Last data filed at 06/16/2021 0500 ?Gross per 24 hour  ?Intake 360 ml  ?Output --  ?Net 360 ml  ? ? ?Filed Weights  ? 05/18/21 0811  ?Weight: 49.3 kg  ? ?Weight change:  ?Body mass index is 19.88 kg/m?.  ? ?Physical Exam: ?General exam: Young male.  Not in physical distress. ?Skin: No rashes, lesions or ulcers. ?HEENT: Atraumatic, normocephalic, no obvious bleeding ?Lungs: Clear to auscultation bilaterally ?CVS: Regular rate and rhythm, no murmur ?GI/Abd soft, nontender, nondistended, bowel sound present ?CNS: Alert, awake, oriented to place ?Psychiatry: Sad affect ?Extremities: No pedal edema, no calf tenderness ? ?Data Review: I have personally reviewed the laboratory data and studies available. ? ?  F/u labs  ?Unresulted Labs (From admission, onward)  ? ? None  ? ?  ? ? ?Signed, ?Terrilee Croak, MD ?[facility] ?06/16/2021 ? ? ? ? ? ? ? ? ? ? ?  ?

## 2021-06-16 NOTE — Progress Notes (Signed)
Mobility Specialist Progress Note: ? ? 06/16/21 1156  ?Mobility  ?Activity Ambulated with assistance in hallway  ?Level of Assistance Independent  ?Assistive Device None  ?Distance Ambulated (ft) 1120 ft  ?Activity Response Tolerated well  ?$Mobility charge 1 Mobility  ? ?Pt received in bed willing to participate in mobility. Complaints of R leg pain. Left In bed with call bell in reach and all need met.  ? ?Kritika Stukes ?Mobility Specialist ?Primary Phone [phone] ? ?

## 2021-06-16 NOTE — TOC Progression Note (Signed)
Transition of Care (TOC) - Progression Note  ? ? ?Patient Details  ?Name: Brenton Imburgia ?MRN: [id number] ?Date of Birth: 03-11-1986 ? ?Transition of Care (TOC) CM/SW Contact  ?Curlene Labrum, RN ?Phone Number: ?06/16/2021, 12:19 PM ? ?Clinical Narrative:    ?CM met with the patient at the bedside and used [name] interpreter I-pad to complete Disability Determination application for - [id number] with the patient.  Documents were completed and emailed to Madilyn Fireman, MSW who will follow up with [facility] accordingly and assist with disability process.  Disability determination is needed to allow patient to have payor source for ALF placement.   ? ?CM and MSW with DTP Team will continue to follow the patient for TOC needs. ? ? ?Expected Discharge Plan: Assisted Living ?Barriers to Discharge: Family Issues, Unsafe home situation ? ?Expected Discharge Plan and Services ?Expected Discharge Plan: Assisted Living ?In-house Referral: Development worker, community ([facility] disability pending) ?Discharge Planning Services: CM Consult ?  ?Living arrangements for the past 2 months: [place] ?                ?  ?  ?  ?  ?  ?  ?  ?  ?  ?  ? ? ?Social Determinants of Health (SDOH) Interventions ?  ? ?Readmission Risk Interventions ?No flowsheet data found. ? ?

## 2021-06-17 LAB — GLUCOSE, CAPILLARY
Glucose-Capillary: 113 mg/dL — ABNORMAL HIGH (ref 70–99)
Glucose-Capillary: 99 mg/dL (ref 70–99)
Glucose-Capillary: 270 mg/dL — ABNORMAL HIGH (ref 70–99)
Glucose-Capillary: 160 mg/dL — ABNORMAL HIGH (ref 70–99)

## 2021-06-17 NOTE — Progress Notes (Signed)
Mobility Specialist Progress Note: ? ? 06/17/21 0946  ?Mobility  ?Activity Ambulated with assistance in hallway  ?Level of Assistance Independent  ?Assistive Device None  ?Distance Ambulated (ft) 1120 ft  ?Activity Response Tolerated well  ?$Mobility charge 1 Mobility  ? ?Pt received in bed willing to participate in mobility. Complaints of pain in both of his feet. [place]t in bed with call bell in reach and all needs met.  ? ?Garrett [name] ?Mobility Specialist ?Primary Phone [phone] ? ?

## 2021-06-17 NOTE — Progress Notes (Signed)
?PROGRESS NOTE ? ?Garrett [name]  ?DOB: [birth year]  ?PCP: Hoy RegisterNewlin, Enobong, MD ?UJW:[id number]  ?DOA: 03/04/2021 ? LOS: 43 days  ?Hospital Day: 106 ? ?Brief narrative: ?Garrett HarrisHtee [name] is a 36 y.o. male with PMH significant for DM2, orthostatic hypotension, severe depression with psychosis. ?On 03/04/2021, patient was brought to the ED by his friend with complaint of noncompliance to medications, not eating, not drinking, isolating himself in being nonverbal. ?He had psychiatric evaluation done.  Per psychiatry note, patient was hearing voices.  He lives in [place] with friends and he was able to perform his activities.  He has had multiple hospitalizations in the past for psychiatric issues.  He would stay controlled with medicines but once he stops taking his medications, his symptoms flares up.  Patient was recommended for inpatient psychiatric placement.  However because of lack of bed availability, patient waited in the ED for about 2 months.  He could not be discharged home because of no contact or care in the community available. ?While in the ED, he had hyperglycemia/hypoglycemic episodes, diarrhea, orthostatic hypotension requiring medication adjustment. ?On 05/05/2021, he was noted to have elevated LFTs, worsening orthostatics and hence he was admitted to hospitalist service. ?He is medically stable for the last several days and is currently just waiting for placement. ? ?Subjective: ?Patient was seen and examined this morning.  ?Sitting up in bed.  Not in distress.  No new symptoms.  ? ?Active Problems: ?  MDD (major depressive disorder), recurrent, severe, with psychosis (HCC) ?  Controlled type 2 diabetes mellitus without complication, with long-term current use of insulin (HCC) ?  Anemia ?  Physical deconditioning ?  Chronic Hepatitis B ?  Low back pain ?  Generalized weakness ?  ? ?Assessment and Plan: ?MDD (major depressive disorder), recurrent, severe, with psychosis (HCC) ?-Appreciate psychiatry  assistance ?-Continue lexapro, cogentin and Invega. ?-Risperidone discontinued 2/2 due to elevated LFTs ?-Pending psychiatry placement. ? ?Type 2 diabetes mellitus ?-A1c 5.5 on 05/14/2021 ?-Currently on Semglee 15 units daily, glipizide 5 mg daily and Tradjenta 5 mg daily. ?-Blood sugar level remains consistently less than 200. ?Recent Labs  ?Lab 06/16/21 ?1204 06/16/21 ?1700 06/16/21 ?2125 06/17/21 ?0809 06/17/21 ?1245  ?GLUCAP 113* 199* 124* 113* 99  ? ? ?Orthostatic hypotension  ?-likely 2/2 autonomic dysfunction ?-Intermittent orthostatic blood pressure drop requiring IV fluid ?-TSH normal, cortisol checked on 04/14/2021 low at 3.1 but cosyntropin stim test on 2/6 baseline cortisol 11 which ruled out adrenal insufficiency ?-continue midodrine and TED hose ? ?Low back pain ?-Controlled on prn Robaxin and Tylenol ?-Continue to encourage increased mobility especially outside of room to minimize back pain ?-06/03/2021, CT of pelvis and lumbar spine unremarkable and remained stable with no evidence of nerve root encroachment ?-Continue to encourage patient to get out of bed and mobilize to minimize back pain. ? ?Chronic Hepatitis B ?-Hepatitis panel on 05/05/2021 showed antigen positive hepatitis B.  CT abdomen pelvis did not show any solid liver mass. ?-GI consult appreciated.  GI recommended outpatient follow-up at [facility] versus with ID here in [place].  ?-Although may be a candidate for hepatitis B treatment would need to be initiated in the outpatient setting with subsequent insurance authorization to avoid interruption in treatment.  Also has severe depression which may influence decision to initiate hepatitis B medical therapy. ?Recent Labs  ?Lab 06/11/21 ?0224  ?AST 47*  ?ALT 76*  ?ALKPHOS 55  ?BILITOT 0.7  ?PROT 7.8  ?ALBUMIN 3.3*  ?PLT 163  ? ? ?Physical  deconditioning ?-PT initially recommended SNF but patient mobility has significantly improved although he still has some episodes of  dizziness. ? ?Chronic anemia ?-Iron panel: Ferritin 696.  B12 in December 2021: 750 and folate 16.5. ?-Hemoglobin stable in the 11 g range.  Outpatient follow-up ? ?Acute urinary retention ?-resolved as of 06/01/2021 ? ?Thrombocytopenia ?-resolved as of 06/01/2021 ?-Platelets stable and > 100,000 ? ?Goals of care ?  Code Status: Full Code  ? ? ?Mobility: Encourage ambulation ? ?Nutritional status:  ?Body mass index is 19.88 kg/m?.  ?  ?  ? ? ? ? ?Diet:  ?Diet Order   ? ?       ?  Diet Carb Modified Fluid consistency: Thin; Room service appropriate? No  Diet effective now       ?  ? ?  ?  ? ?  ? ? ?DVT prophylaxis:  ?Place TED hose Start: 05/09/21 2054 ?Place TED hose Start: 05/05/21 1520 ?  ?Antimicrobials: None ?Fluid: None currently ?Consultants: None currently ?Family Communication: None at bedside ? ?Status is: Inpatient ? ?Continue in-hospital care because: Pending placement ?Level of care: Med-Surg  ? ?Dispo: The patient is from: Home ?             Anticipated d/c is to: [facility].  ?             Patient currently is medically stable to d/c. ?  Difficult to place patient Yes ? ? ?Infusions:  ? sodium chloride    ? ? ?Scheduled Meds: ? benztropine  0.5 mg Oral BID  ? escitalopram  10 mg Oral Daily  ? feeding supplement (GLUCERNA SHAKE)  237 mL Oral TID BM  ? glipiZIDE  5 mg Oral QAC breakfast  ? insulin aspart  0-6 Units Subcutaneous TID WC  ? insulin glargine-yfgn  15 Units Subcutaneous Daily  ? linagliptin  5 mg Oral Daily  ? LORazepam  0.5 mg Oral QHS  ? magnesium hydroxide  30 mL Oral Once  ? midodrine  10 mg Oral TID WC  ? paliperidone  3 mg Oral QHS  ? polyethylene glycol  17 g Oral Daily  ? selenium sulfide   Topical Daily  ? senna-docusate  1 tablet Oral QHS  ? sodium chloride flush  3 mL Intravenous Q12H  ? thiamine  100 mg Oral Daily  ? ? ?PRN meds: ?sodium chloride, acetaminophen, [COMPLETED] methocarbamol **FOLLOWED BY** methocarbamol, sodium chloride flush  ? ?Antimicrobials: ?Anti-infectives (From  admission, onward)  ? ? None  ? ?  ? ? ?Objective: ?Vitals:  ? 06/17/21 0450 06/17/21 0813  ?BP: 107/79 109/76  ?Pulse: 97 94  ?Resp: 17 18  ?Temp: 98 ?F (36.7 ?C) (!) 97.5 ?F (36.4 ?C)  ?SpO2: 98% 99%  ? ? ?Intake/Output Summary (Last 24 hours) at 06/17/2021 1459 ?Last data filed at 06/17/2021 0900 ?Gross per 24 hour  ?Intake 520 ml  ?Output --  ?Net 520 ml  ? ? ?Filed Weights  ? 05/18/21 0811  ?Weight: 49.3 kg  ? ?Weight change:  ?Body mass index is 19.88 kg/m?.  ? ?Physical Exam: ?General exam: Young male.  Not in physical distress. ?Skin: No rashes, lesions or ulcers. ?HEENT: Atraumatic, normocephalic, no obvious bleeding ?Lungs: Clear to auscultation bilaterally ?CVS: Regular rate and rhythm, no murmur ?GI/Abd soft, nontender, nondistended, bowel sound present ?CNS: Alert, awake, oriented to place ?Psychiatry: Sad affect ?Extremities: No pedal edema, no calf tenderness ? ?Data Review: I have personally reviewed the laboratory data and studies available. ? ?  F/u labs  ?Unresulted Labs (From admission, onward)  ? ? None  ? ?  ? ? ?Signed, ?Lorin Glass, MD ?[facility] ?06/17/2021 ? ? ? ? ? ? ? ? ? ? ?  ?

## 2021-06-18 DIAGNOSIS — F333 Major depressive disorder, recurrent, severe with psychotic symptoms: Secondary | ICD-10-CM | POA: Diagnosis not present

## 2021-06-18 LAB — GLUCOSE, CAPILLARY
Glucose-Capillary: 115 mg/dL — ABNORMAL HIGH (ref 70–99)
Glucose-Capillary: 188 mg/dL — ABNORMAL HIGH (ref 70–99)
Glucose-Capillary: 117 mg/dL — ABNORMAL HIGH (ref 70–99)
Glucose-Capillary: 109 mg/dL — ABNORMAL HIGH (ref 70–99)

## 2021-06-18 NOTE — Progress Notes (Signed)
?PROGRESS NOTE ? ? ? ?Garrett [name]  [id number] DOB: 08-Apr-1985 DOA: 03/04/2021 ?PCP: Hoy Register, MD  ? ?  ?Brief Narrative:  ?Garrett [name] is a 36 y.o. male with PMH significant for DM2, orthostatic hypotension, severe depression with psychosis. ?On 03/04/2021, patient was brought to the ED by his friend with complaint of noncompliance to medications, not eating, not drinking, isolating himself in being nonverbal. ?He had psychiatric evaluation done.  Per psychiatry note, patient was hearing voices.  He lives in [place] with friends and he was able to perform his activities.  He has had multiple hospitalizations in the past for psychiatric issues.  He would stay controlled with medicines but once he stops taking his medications, his symptoms flares up.  Patient was recommended for inpatient psychiatric placement.  However because of lack of bed availability, patient waited in the ED for about 2 months.  He could not be discharged home because of no contact or care in the community available. ?While in the ED, he had hyperglycemia/hypoglycemic episodes, diarrhea, orthostatic hypotension requiring medication adjustment. ?On 05/05/2021, he was noted to have elevated LFTs, worsening orthostatics and hence he was admitted to hospitalist service. ?He is medically stable for the last several days and is currently just waiting for placement. ?  ?New events last 24 hours / Subjective: ?No new symptoms, resting in bed.  ? ?Assessment & Plan: ?  ?Active Problems: ?  MDD (major depressive disorder), recurrent, severe, with psychosis (HCC) ?  Controlled type 2 diabetes mellitus without complication, with long-term current use of insulin (HCC) ?  Anemia ?  Physical deconditioning ?  Chronic Hepatitis B ?  Low back pain ?  Generalized weakness ? ?Major depressive disorder, recurrent, severe, with psychosis ? Psychiatry has evaluated the patient during the admission again and deemed him not a harm to himself or others ? Continue  Lexapro, Cogentin, Invega, Ativan ? Waiting on SNF placement ?  ?Orthostatic hypotension ? Secondary to autonomic dysfunction ? Continue midodrine 3 times daily ? ?Diabetes ? Glipizide 5 mg daily, Tradjenta 5 mg daily ? Basal insulin 15 units daily, sliding scale with meals ? ? ? ?DVT prophylaxis: CDs ?Code Status: Full ?Family Communication: None at bedside ?Coming From: Technically home ?Disposition Plan: SNF ?Barriers to Discharge: SNF placement ? ?Consultants:  ?Psychiatry ? ?Procedures:  ?None ? ?Objective: ?Vitals:  ? 06/17/21 0813 06/17/21 2108 06/18/21 0415 06/18/21 4401  ?BP: 109/76 111/69 97/67 107/78  ?Pulse: 94 (!) 105 95 94  ?Resp: 18 19 16 19   ?Temp: (!) 97.5 ?F (36.4 ?C) 98.3 ?F (36.8 ?C) 98.7 ?F (37.1 ?C) 97.7 ?F (36.5 ?C)  ?TempSrc: Oral Oral Oral   ?SpO2: 99% 97% 99% 99%  ?Weight:      ?Height:      ? ? ?Intake/Output Summary (Last 24 hours) at 06/18/2021 1234 ?Last data filed at 06/18/2021 1000 ?Gross per 24 hour  ?Intake 720 ml  ?Output --  ?Net 720 ml  ? ?Filed Weights  ? 05/18/21 0811  ?Weight: 49.3 kg  ? ? ?Examination:  ?General exam: Appears calm and comfortable  ?Respiratory system: Clear to auscultation. Respiratory effort normal. No respiratory distress. No conversational dyspnea.  ?Cardiovascular system: S1 & S2 heard, RRR. No murmurs. No pedal edema. ?Gastrointestinal system: Abdomen is nondistended, soft and nontender. Normal bowel sounds heard. ?Central nervous system: Alert and oriented. No focal neurological deficits. Speech clear.  ?Skin: No rashes, lesions or ulcers on exposed skin  ?Psychiatry: Judgement and insight appear normal. Mood &  affect appropriate.  ? ?Data Reviewed: I have personally reviewed following labs and imaging studies ? ?CBG: ?Recent Labs  ?Lab 06/17/21 ?1245 06/17/21 ?1716 06/17/21 ?2105 06/18/21 ?9983 06/18/21 ?1135  ?GLUCAP 99 270* 160* 115* 188*  ? ?Radiology Studies: ?None ? ? ?Scheduled Meds: ? benztropine  0.5 mg Oral BID  ? escitalopram  10 mg Oral Daily   ? feeding supplement (GLUCERNA SHAKE)  237 mL Oral TID BM  ? glipiZIDE  5 mg Oral QAC breakfast  ? insulin aspart  0-6 Units Subcutaneous TID WC  ? insulin glargine-yfgn  15 Units Subcutaneous Daily  ? linagliptin  5 mg Oral Daily  ? LORazepam  0.5 mg Oral QHS  ? magnesium hydroxide  30 mL Oral Once  ? midodrine  10 mg Oral TID WC  ? paliperidone  3 mg Oral QHS  ? polyethylene glycol  17 g Oral Daily  ? selenium sulfide   Topical Daily  ? senna-docusate  1 tablet Oral QHS  ? sodium chloride flush  3 mL Intravenous Q12H  ? thiamine  100 mg Oral Daily  ? ?Continuous Infusions: ? sodium chloride    ? ? ? LOS: 44 days  ? ? ?Time spent: 20 minutes  ? ?Sharlene Dory, DO ?[facility] ?06/18/2021, 12:34 PM  ? ?Available via Epic secure chat 7am-7pm ?After these hours, please refer to coverage provider listed on [email or web address] ? ?

## 2021-06-19 DIAGNOSIS — F333 Major depressive disorder, recurrent, severe with psychotic symptoms: Secondary | ICD-10-CM | POA: Diagnosis not present

## 2021-06-19 LAB — GLUCOSE, CAPILLARY
Glucose-Capillary: 192 mg/dL — ABNORMAL HIGH (ref 70–99)
Glucose-Capillary: 155 mg/dL — ABNORMAL HIGH (ref 70–99)
Glucose-Capillary: 191 mg/dL — ABNORMAL HIGH (ref 70–99)
Glucose-Capillary: 117 mg/dL — ABNORMAL HIGH (ref 70–99)

## 2021-06-19 NOTE — Progress Notes (Signed)
?PROGRESS NOTE ? ? ? ?Garrett [name]  FXT:[id number] DOB: [birth year] DOA: 03/04/2021 ?PCP: Hoy Register, MD  ? ?  ?Brief Narrative:  ?Garrett [name] is a 36 y.o. male with PMH significant for DM2, orthostatic hypotension, severe depression with psychosis. ?On 03/04/2021, patient was brought to the ED by his friend with complaint of noncompliance to medications, not eating, not drinking, isolating himself in being nonverbal. ?He had psychiatric evaluation done.  Per psychiatry note, patient was hearing voices.  He lives in [place] with friends and he was able to perform his activities.  He has had multiple hospitalizations in the past for psychiatric issues.  He would stay controlled with medicines but once he stops taking his medications, his symptoms flares up.  Patient was recommended for inpatient psychiatric placement.  However because of lack of bed availability, patient waited in the ED for about 2 months.  He could not be discharged home because of no contact or care in the community available. ?While in the ED, he had hyperglycemia/hypoglycemic episodes, diarrhea, orthostatic hypotension requiring medication adjustment. ?On 05/05/2021, he was noted to have elevated LFTs, worsening orthostatics and hence he was admitted to hospitalist service. ?He is medically stable for the last several days and is currently just waiting for placement. ? ? ?New events last 24 hours / Subjective: ?No new symptoms, resting in bed. Sugars stable. No questions or concerns.  ? ?Assessment & Plan: ?  ?Active Problems: ?  MDD (major depressive disorder), recurrent, severe, with psychosis (HCC) ?  Controlled type 2 diabetes mellitus without complication, with long-term current use of insulin (HCC) ?  Anemia ?  Physical deconditioning ?  Chronic Hepatitis B ?  Low back pain ?  Generalized weakness ? ?Major depressive disorder, recurrent, severe, with psychosis ?            Psychiatry has evaluated the patient during the admission again and  deemed him not a harm to himself or others ?            Continue Lexapro, Cogentin, Invega, Ativan ?            Waiting on SNF/ALF placement, CM/SW is working on disability for him so they will pay for it ?             ?Orthostatic hypotension ?            Secondary to autonomic dysfunction ?            Continue midodrine 3 times daily ?  ?Diabetes ?            Glipizide 5 mg daily, Tradjenta 5 mg daily ?            Basal insulin 15 units daily, sliding scale with meals  ? Sugars have been stable ? ?DVT prophylaxis: SCDs ?Code Status: Full ?Family Communication: None at bedside ?Coming From: Home ?Disposition Plan: SNF vs ALF ?Barriers to Discharge: No insurance, foreign citizen, placement ? ?Consultants:  ?Psychiatry ? ?Procedures:  ?None ? ? ?Objective: ?Vitals:  ? 06/18/21 0812 06/18/21 1654 06/18/21 1955 06/19/21 0424  ?BP: 107/78 106/80 (!) 133/97 130/84  ?Pulse: 94 99 93 90  ?Resp: 19 18 19 19   ?Temp: 97.7 ?F (36.5 ?C) (!) 97.3 ?F (36.3 ?C) 98 ?F (36.7 ?C) 98.3 ?F (36.8 ?C)  ?TempSrc:  Oral Oral Oral  ?SpO2: 99%  99% 96%  ?Weight:      ?Height:      ? ? ?Intake/Output Summary (Last 24 hours) at  06/19/2021 1448 ?Last data filed at 06/18/2021 2000 ?Gross per 24 hour  ?Intake 1080 ml  ?Output --  ?Net 1080 ml  ? ?Filed Weights  ? 05/18/21 0811  ?Weight: 49.3 kg  ? ? ?Examination:  ?General exam: Appears calm and comfortable  ?Respiratory system: Clear to auscultation. Respiratory effort normal. No respiratory distress. No conversational dyspnea.  ?Cardiovascular system: S1 & S2 heard, RRR. No murmurs. No pedal edema. ?Gastrointestinal system: Abdomen is nondistended, soft and nontender. Normal bowel sounds heard. ?Central nervous system: Alert ?Extremities: Symmetric in appearance  ?Psychiatry: Mood & affect appropriate.  ? ?Data Reviewed: I have personally reviewed following labs and imaging studies ? ?CBG: ?Recent Labs  ?Lab 06/17/21 ?2105 06/18/21 ?1856 06/18/21 ?1135 06/18/21 ?1653 06/18/21 ?2156  ?GLUCAP 160*  115* 188* 117* 109*  ? ? ? ?Radiology Studies: ?None ? ?Scheduled Meds: ? benztropine  0.5 mg Oral BID  ? escitalopram  10 mg Oral Daily  ? feeding supplement (GLUCERNA SHAKE)  237 mL Oral TID BM  ? glipiZIDE  5 mg Oral QAC breakfast  ? insulin aspart  0-6 Units Subcutaneous TID WC  ? insulin glargine-yfgn  15 Units Subcutaneous Daily  ? linagliptin  5 mg Oral Daily  ? LORazepam  0.5 mg Oral QHS  ? magnesium hydroxide  30 mL Oral Once  ? midodrine  10 mg Oral TID WC  ? paliperidone  3 mg Oral QHS  ? polyethylene glycol  17 g Oral Daily  ? selenium sulfide   Topical Daily  ? senna-docusate  1 tablet Oral QHS  ? sodium chloride flush  3 mL Intravenous Q12H  ? thiamine  100 mg Oral Daily  ? ?Continuous Infusions: ? sodium chloride    ? ? ? LOS: 45 days  ? ? ?Time spent: 15 minutes  ? ?Sharlene Dory, DO ?[facility] ?06/19/2021, 8:22 AM  ? ?Available via Epic secure chat 7am-7pm ?After these hours, please refer to coverage provider listed on [email or web address] ? ?

## 2021-06-19 NOTE — Plan of Care (Signed)

## 2021-06-20 DIAGNOSIS — E119 Type 2 diabetes mellitus without complications: Secondary | ICD-10-CM | POA: Diagnosis not present

## 2021-06-20 DIAGNOSIS — F333 Major depressive disorder, recurrent, severe with psychotic symptoms: Secondary | ICD-10-CM | POA: Diagnosis not present

## 2021-06-20 DIAGNOSIS — Z794 Long term (current) use of insulin: Secondary | ICD-10-CM | POA: Diagnosis not present

## 2021-06-20 LAB — GLUCOSE, CAPILLARY
Glucose-Capillary: 118 mg/dL — ABNORMAL HIGH (ref 70–99)
Glucose-Capillary: 178 mg/dL — ABNORMAL HIGH (ref 70–99)
Glucose-Capillary: 150 mg/dL — ABNORMAL HIGH (ref 70–99)
Glucose-Capillary: 182 mg/dL — ABNORMAL HIGH (ref 70–99)

## 2021-06-20 NOTE — Progress Notes (Addendum)
Patient will require 30 days or less of rehab at a Skilled Nursing Facility. ? ?Edwin Dada, MSW, LCSW ?Transitions of Care  Clinical Social Worker II ?[phone] ? ?

## 2021-06-20 NOTE — Progress Notes (Signed)
?Progress Note ? ? ?PatientWills [name] [id number] DOB: [birth year] DOA: 03/04/2021     46 ?DOS: the patient was seen and examined on 06/20/2021 ?Needs interpreter/speaks [name] ?  ?Brief hospital course: ?Garrett [name] is a 36 y.o. male with medical history significant of severe depression with psychosis, T2DM and orthostatic hypotension who had been holding in ED for about 2 months time waiting on placement. He was being cared for by members in the community, but they have returned home and he no longer has care. He has been boarding in ED with some hypoglycemic episodes, diarrhea (resolved after metformin adjusted) and some orthostatic hypotension that continues to persist despite stopping his antihypertensive medication as well as addition of SSRI/mood stabilizing drugs.  While in the ER he also had an isolated episode of hyperglycemia with serum glucose 503 with a normal anion gap.  Not consistent with DKA.  Labs were checked and he was noted to have significantly elevated LFTs and with concomitant orthostasis he was referred for admission.  Currently, social work continues to work on placement.  He continues to have significant orthostasis that is limiting his ability to ambulate. ?  ? ? ?Assessment and Plan: ?Controlled type 2 diabetes mellitus without complication, with long-term current use of insulin ([place]) ?Hga1c of 5.9 04/2021 ?HOME MEDS: Lantus 40 units daily and Metformin XR 500 mg daily ?Stopped metformin 2/2 diarrhea.   ?Continue Tradjenta, Glucotrol and Semglee ?CBGs remain well controlled  ? ? ?Chronic Hepatitis B ?Appreciate GI assistance-suspect chronic Hep B with worsening of LFTs in the setting of ischemic injury during orthostasis vs. Drug induced liver injury (risperdal) ?After DC needs referral to ID for management of chronic hep B ?AFP tumor marker slightly elevated at 10 c/w acute exacerbation and hepatitis process -CT abdomen without any solid liver masses ?GI recommends outpatient follow-up at  [facility] hepatology clinic versus with ID here in [place]. Liver biopsy should be considered. ?-Although may be a candidate for hepatitis B treatment would need to be initiated in the outpatient setting with subsequent insurance authorization to avoid interruption in treatment.  Also has severe depression which may influence decision to initiate hepatitis B medical therapy. ? ? ?Orthostatic hypotension likely 2/2 autonomic dysfunction-resolved as of 06/01/2021 ?Has had episodic orthostasis while in ED during the past 2 months and received intermittent IV fluids ?TSH normal, cortisol checked on 04/14/2021 low at 3.1 but cosyntropin stim test on 2/6 baseline cortisol 11 which ruled out adrenal insufficiency ?-continue TED hose ?-Cont Midodrine ? ?MDD (major depressive disorder), recurrent, severe, with psychosis ([name]) ?Appreciate psychiatry assistance ?Continue lexapro and cogentin ?Risperidone discontinued 2/2 elevated LFTs and Invega initiated by the psychiatric team ?Social isolation due to language barrier remains a significant problem and is likely influencing his depression ? ? ? ?Acute urinary retention-resolved as of 06/01/2021 ?Resolved ? ?Anemia ?Iron panel: Ferritin 696.  B12 in December 2021: 750 and folate 16.5. ?Hemoglobin stable in the 11 g range.  Outpatient follow-up ? ?Thrombocytopenia (HCC)-resolved as of 06/01/2021 ?Platelets stable and > 100,000 ? ?Physical deconditioning ?PT initially recommended SNF but patient mobility has significantly improved although he still has some episodes of dizziness. ?Physical activity continues to be limited by fatigue. Pt spends most of his days sitting in the bed although able to toilet independently. Previously enc to walk outside of room (using translation svc) ?On 3/2 communication limited by lack of translating service noting app was being dated.  I was able to communicate with patient that he needed to  get out of bed and walk in the hallway regularly.  Because he  stays in the bed most of the time this is why he is tired-not sure he understood.  Plan to recommunicate this tomorrow when translating service available ? ?A physical therapy consult is indicated based on the patient?s mobility assessment. ? ? ?Mobility Assessment (last 72 hours)   ? ? Mobility Assessment   ? ? [place] Name 05/30/21 2000 05/29/21 1925 05/29/21 0800 05/28/21 1955  ?  ? Does patient have an order for bedrest or is patient medically unstable No - Continue assessment No - Continue assessment No - Continue assessment No - Continue assessment   ? What is the highest level of mobility based on the progressive mobility assessment? Level 6 (Walks independently in room and hall) - Balance while walking in room without assist - Complete Level 6 (Walks independently in room and hall) - Balance while walking in room without assist - Complete Level 6 (Walks independently in room and hall) - Balance while walking in room without assist - Complete Level 6 (Walks independently in room and hall) - Balance while walking in room without assist - Complete   ? Is the above level different from baseline mobility prior to current illness? No - Consider discontinuing PT/OT No - Consider discontinuing PT/OT No - Consider discontinuing PT/OT No - Consider discontinuing PT/OT   ? ?  ?  ? ?  ? ? ? ?Low back pain ?Controlled on prn Robaxin and Tylenol ?Continue to encourage increased mobility especially outside of room to minimize back pain ?Patient complaining of radicular pain.  CT of pelvis and lumbar spine unremarkable and remained stable with no evidence of nerve root encroachment ?Continue to encourage patient to get out of bed and mobilize to minimize back pain. ? ?Elevated LFTs ?See Hepatitis B notation ? ?Constipation ?Documented bowel movement on 3/1 ?Continue daily MiraLAX and Senokot ?Give one-time dose of milk of magnesia on 3/6 ? ? ? ?Subjective:  ?Reporting continued issues with low back and leg pain.  When further  investigated patient stated he had just taken some pain medication after asking the nurse for something for pain ? ?Physical Exam: ?Vitals:  ? 06/19/21 0855 06/19/21 1514 06/19/21 1957 06/20/21 0426  ?BP: 138/85 117/81 (!) 132/92 118/77  ?Pulse: 94 94 88 92  ?Resp: 17 18 18 18   ?Temp: (!) 97.4 ?F (36.3 ?C) 97.8 ?F (36.6 ?C) 98.3 ?F (36.8 ?C) 98.3 ?F (36.8 ?C)  ?TempSrc:   Oral Oral  ?SpO2: 99% 98% 98% 98%  ?Weight:      ?Height:      ? ?General: Calm, NAD ?Respiratory: Lungs are CTA, normal respiratory effort, room air ?Cardiovascular: S1 S2, regular pulse. TED hose  ?Gastrointestinal: Abdomen is nondistended, soft and nontender.  Positive bowel sounds noted.  LBM 3/20 ?Neurological: Cranial nerves II through XII are grossly intact, patient able to move all extremities x4 with strength 5/5.  Sensation is intact ?Hair and skin: No rashes, lesions or ulcers.  Patient's hair is greasy and unkempt and has dandruff flakes throughout. ?Psychiatry: Judgement and insight intact.  Mood & affect flat ? ?Data Reviewed: ?There are no new results to review at this time. ? ? DVT Prophylaxis   ?., Place ted hose  ?Place ted hose  ? ? ?Family Communication:  ?No family available noting they have all returned to [place] ? ?Disposition: ?Remains inpatient appropriate because: Unsafe discharge plan-deconditioned with ambulation concerns primarily related to recurrent orthostasis-also uncontrolled  diabetes at presentation; severe depression along with language barrier contributing to patient's ability for self-care-care be recommends short-term SNF for rehabilitation; also patient does not have a way to pay for SNF or rehab ?Please review any new documentation from CM/LCSW ? ? ? ? ?Planned Discharge Destination:  ?Family care home/Rest home or ALF ? ? ?COVID vaccination status:  ?Unknown ? ?Consultants: ?Psychiatry ?Gastroenterology ?Procedures: ?Echocardiogram ?Antibiotics: ?None ? ? ? ?Time spent: 15 minutes ? ?Author: ?Erin Hearing, NP ?06/20/2021 8:15 AM ? ?For on call review [email or web address].  ? ?

## 2021-06-20 NOTE — [place] FL2 (Signed)
?[place] MEDICAID FL2 LEVEL OF CARE SCREENING TOOL  ?  ? ?IDENTIFICATION  ?Patient Name: ?Garrett [name] Birthdate: 15-Nov-1985 Sex: male Admission Date (Current Location): ?03/04/2021  ?[place] and [place] Number: ? [place] ?[id number] L Facility and Address:  ?[facility], [address], [place], [place] [address] ?     Provider Number: ?[id number]  ?Attending Physician Name and Address:  ?Barnetta Chapel, MD ? Relative Name and Phone Number:  ?  ?   ?Current Level of Care: ?Hospital Recommended Level of Care: ?Skilled Nursing Facility, Assisted Living Facility Prior Approval Number: ?  ? ?Date Approved/Denied: ?  PASRR Number: ?[id number] ? ?Discharge Plan: ?SNF ?  ? ?Current Diagnoses: ?Patient Active Problem List  ? Diagnosis Date Noted  ? Generalized weakness   ? Low back pain 05/24/2021  ? Major depressive disorder, recurrent episode, moderate with mood-congruent psychotic features (HCC)   ? Chronic Hepatitis B 05/12/2021  ? Physical deconditioning   ? Anemia 05/05/2021  ? Altered mental status   ? Controlled type 2 diabetes mellitus without complication, with long-term current use of insulin (HCC) 03/09/2020  ? Acute metabolic encephalopathy 03/09/2020  ? Elevated LFTs 03/09/2020  ? Major depressive disorder, recurrent severe without psychotic features (HCC) 06/20/2017  ? MDD (major depressive disorder), recurrent, severe, with psychosis (HCC) 06/19/2017  ? ? ?Orientation RESPIRATION BLADDER Height & Weight   ?  ?Self, Time, Situation, Place ? Normal Continent Weight: 108 lb 11 oz (49.3 kg) ?Height:  5\' 2"  (157.5 cm)  ?BEHAVIORAL SYMPTOMS/MOOD NEUROLOGICAL BOWEL NUTRITION STATUS  ?    Continent Diet (Normal)  ?AMBULATORY STATUS COMMUNICATION OF NEEDS Skin   ?Independent Verbally Normal ?  ?  ?  ?    ?     ?     ? ? ?Personal Care Assistance Level of Assistance  ?Bathing, Feeding, Dressing Bathing Assistance: Independent ?Feeding assistance: Independent ?Dressing Assistance:  Independent ?   ? ?Functional Limitations Info  ?Sight, Hearing, Speech Sight Info: Adequate ?Hearing Info: Adequate ?Speech Info: Adequate  ? ? ?SPECIAL CARE FACTORS FREQUENCY  ?    ?  ?  ?  ?  ?  ?  ?   ? ? ?Contractures Contractures Info: Not present  ? ? ?Additional Factors Info  ?Code Status, Allergies, Insulin Sliding Scale Code Status Info: Full ?Allergies Info: No known allergies ?  ?Insulin Sliding Scale Info: See discharge summary for medication management ?  ?   ? ?Current Medications (06/20/2021):  This is the current hospital active medication list ?Current Facility-Administered Medications  ?Medication Dose Route Frequency Provider Last Rate Last Admin  ? 0.9 %  sodium chloride infusion  250 mL Intravenous PRN 06/22/2021, MD      ? acetaminophen (TYLENOL) tablet 650 mg  650 mg Oral Q6H PRN Orland Mustard, MD   650 mg at 06/19/21 0944  ? benztropine (COGENTIN) tablet 0.5 mg  0.5 mg Oral BID 06/21/21, MD   0.5 mg at 06/20/21 0836  ? escitalopram (LEXAPRO) tablet 10 mg  10 mg Oral Daily Mesner, Jason, MD   10 mg at 06/20/21 0836  ? feeding supplement (GLUCERNA SHAKE) (GLUCERNA SHAKE) liquid 237 mL  237 mL Oral TID BM 06/22/21, NP   237 mL at 06/20/21 1339  ? glipiZIDE (GLUCOTROL) tablet 5 mg  5 mg Oral QAC breakfast Dahal, 06/22/21, MD   5 mg at 06/20/21 0836  ? insulin aspart (novoLOG) injection 0-6 Units  0-6 Units Subcutaneous  TID WC Russella Dar, NP   1 Units at 06/20/21 1202  ? insulin glargine-yfgn (SEMGLEE) injection 15 Units  15 Units Subcutaneous Daily Russella Dar, NP   15 Units at 06/20/21 1023  ? linagliptin (TRADJENTA) tablet 5 mg  5 mg Oral Daily Russella Dar, NP   5 mg at 06/20/21 3295  ? LORazepam (ATIVAN) tablet 0.5 mg  0.5 mg Oral QHS Rankin, Shuvon B, NP   0.5 mg at 06/19/21 2147  ? magnesium hydroxide (MILK OF MAGNESIA) suspension 30 mL  30 mL Oral Once Russella Dar, NP      ? methocarbamol (ROBAXIN) tablet 500 mg  500 mg Oral Q8H PRN Russella Dar,  NP   500 mg at 06/19/21 0944  ? midodrine (PROAMATINE) tablet 10 mg  10 mg Oral TID WC Elease Etienne, MD   10 mg at 06/20/21 1201  ? paliperidone (INVEGA) 24 hr tablet 3 mg  3 mg Oral QHS Karsten Ro, MD   3 mg at 06/19/21 2148  ? polyethylene glycol (MIRALAX / GLYCOLAX) packet 17 g  17 g Oral Daily Elease Etienne, MD   17 g at 06/19/21 0943  ? selenium sulfide (SELSUN) 1 % shampoo   Topical Daily Russella Dar, NP   Given at 06/20/21 [phone]  ? senna-docusate (Senokot-S) tablet 1 tablet  1 tablet Oral QHS Elease Etienne, MD   1 tablet at 06/19/21 2147  ? sodium chloride flush (NS) 0.9 % injection 3 mL  3 mL Intravenous Q12H Orland Mustard, MD   3 mL at 06/20/21 0840  ? sodium chloride flush (NS) 0.9 % injection 3 mL  3 mL Intravenous PRN Orland Mustard, MD      ? thiamine tablet 100 mg  100 mg Oral Daily Mesner, Barbara Cower, MD   100 mg at 06/20/21 1660  ? ? ? ?Discharge Medications: ?Please see discharge summary for a list of discharge medications. ? ?Relevant Imaging Results: ? ?Relevant Lab Results: ? ? ?Additional Information ?SSN# [id number] ? ?Inis Sizer, LCSW ? ? ? ? ?

## 2021-06-20 NOTE — Progress Notes (Signed)
Mobility Specialist Progress Note: ? ? 06/20/21 1310  ?Mobility  ?Activity Ambulated with assistance in hallway  ?Level of Assistance Independent  ?Assistive Device None  ?Distance Ambulated (ft) 855 ft  ?Activity Response Tolerated well  ?$Mobility charge 1 Mobility  ? ?Pt received in bed willing to participate in mobility. No complaints of pain. Pt left in bed with call bell in reach and all needs met.  ? ?Garrett [name] ?Mobility Specialist ?Primary Phone [phone] ? ?

## 2021-06-20 NOTE — Progress Notes (Addendum)
A new FL2 was completed and submitted to [place] MUST to obtain a new PASSR [id number]. ? ?Clinicals were sent to Freda Munro at [facility] for review. ? ?Madilyn Fireman, MSW, LCSW ?Transitions of Care  Clinical Social Worker II ?[phone] ? ?

## 2021-06-20 NOTE — Progress Notes (Addendum)
Physical Therapy Treatment ?Patient Details ?Name: Garrett [name] ?MRN: [id number] ?DOB: [birth year] ?Today's Date: 06/20/2021 ? ? ?History of Present Illness Pt is a 36 y.o. male presenting to ED 03/04/21 with withdrawal from friends, lack of oral intake, possibly catatonic behavior; thought to be secondary to MDD. Per nsg pt sustained a fall with unsteady knees early on in his admission. Current issue of hypotension, symptomatic and admitted to floor on 05/05/21 with transaminitis and right sided abdominal pain. PMH includes DM, medication non-compliance, AMS, DKA, acute metabolic encephalopathy, MDD. ? ?  ?PT Comments  ? ? Pt continues to progress towards goals with progression of functional mobility and demonstration of increased stability and balance. Pt able to complete side, retro, and tandem stepping with slight postural sway and no LOB noted. Some LOB with braiding but pt able to self correct in all instances. Pt able to maintain standing balance with eyes closed with feet together and tandem for over 30 seconds in all instances. Anticipate safe discharge once medically cleared. Pt has made significant progress meeting goals, discussed with supervising PT, PT to sign off as mobility team continues to follow.   ?  ?Recommendations for follow up therapy are one component of a multi-disciplinary discharge planning process, led by the attending physician.  Recommendations may be updated based on patient status, additional functional criteria and insurance authorization. ? ?Follow Up Recommendations ? No PT follow up ?  ?  ?Assistance Recommended at Discharge PRN  ?Patient can return home with the following Assist for transportation;Assistance with cooking/housework;Direct supervision/assist for financial management;Direct supervision/assist for medications management ?  ?Equipment Recommendations ? None recommended by PT  ?  ?Recommendations for Other Services   ? ? ?  ?Precautions / Restrictions Precautions ?Precautions:  None ?Precaution Comments: not orthostatic during session today ?Restrictions ?Weight Bearing Restrictions: No  ?  ? ?Mobility ? Bed Mobility ?Overal bed mobility: Independent ?  ?  ?  ?  ?  ?  ?  ?  ? ?Transfers ?Overall transfer level: Independent ?  ?  ?  ?  ?  ?  ?  ?  ?  ?  ? ?Ambulation/Gait ?Ambulation/Gait assistance: Independent ?  ?Assistive device: None ?Gait Pattern/deviations: Step-through pattern, Narrow base of support ?Gait velocity: normal ?  ?  ?  ? ? ?Stairs ?  ?  ?  ?  ?  ? ? ?Wheelchair Mobility ?  ? ?Modified Rankin (Stroke Patients Only) ?  ? ? ?  ?Balance   ?  ?  ?  ?  ?Standing balance support: No upper extremity supported ?Standing balance-Leahy Scale: Good ?  ?  ?  ?  ?  ?  ?  ?  ?  ?  ?  ?  ?  ? ?  ?Cognition Arousal/Alertness: Awake/alert ?Behavior During Therapy: Flat affect ?Overall Cognitive Status: Difficult to assess ?  ?  ?  ?  ?  ?  ?  ?  ?  ?  ?  ?  ?  ?  ?  ?  ?  ?  ?  ? ?  ?Exercises Other Exercises ?Other Exercises: side stepping, braiding, retro stepping, tandem stepping, multiple bouts for balance challenge ?Other Exercises: SLS x5 ea side ?Other Exercises: feet together EC static stance, tandem stance with EC >30 secs all instances for balance challenge ? ?  ?General Comments   ?  ?  ? ?Pertinent Vitals/Pain Pain Assessment ?Pain Assessment: Faces ?Faces Pain Scale: Hurts a little bit ?Pain Location:  bottoms of feet ?Pain Descriptors / Indicators: Guarding ?Pain Intervention(s): Limited activity within patient's tolerance, Monitored during session  ? ? ?Home Living   ?  ?  ?  ?  ?  ?  ?  ?  ?  ?   ?  ?Prior Function    ?  ?  ?   ? ?PT Goals (current goals can now be found in the care plan section) Acute Rehab PT Goals ?Patient Stated Goal: none reported ?PT Goal Formulation: With patient ?Time For Goal Achievement: 05/31/21 ? ?  ?Frequency ? ? ? Min 1X/week ? ? ? ?  ?PT Plan Current plan remains appropriate  ? ? ?Co-evaluation   ?  ?  ?  ?  ? ?  ?AM-PAC PT "6 Clicks"  Mobility   ?Outcome Measure ? Help needed turning from your back to your side while in a flat bed without using bedrails?: None ?Help needed moving from lying on your back to sitting on the side of a flat bed without using bedrails?: None ?Help needed moving to and from a bed to a chair (including a wheelchair)?: None ?Help needed standing up from a chair using your arms (e.g., wheelchair or bedside chair)?: None ?Help needed to walk in hospital room?: None ?Help needed climbing 3-5 steps with a railing? : A Little ?6 Click Score: 23 ? ?  ?End of Session Equipment Utilized During Treatment: Gait belt ?Activity Tolerance: Patient tolerated treatment well ?Patient left: in bed;with call bell/phone within reach ?Nurse Communication: Mobility status ?PT Visit Diagnosis: Adult, failure to thrive (R62.7);Muscle weakness (generalized) (M62.81);History of falling (Z91.81);Other abnormalities of gait and mobility (R26.89) ?  ? ? ?Time: [id number] ?PT Time Calculation (min) (ACUTE ONLY): 14 min ? ?Charges:  $Therapeutic Exercise: 8-22 mins          ?          ? ?Audry Riles. PTA ?Acute Rehabilitation Services ?Office: [phone] ? ? ? ?Betsey Holiday Zofia Peckinpaugh ?06/20/2021, 10:47 AM ? ?

## 2021-06-20 NOTE — TOC Progression Note (Signed)
Transition of Care (TOC) - Progression Note  ? ? ?Patient Details  ?Name: Garrett [name] ?MRN: [id number] ?Date of Birth: [birth year] ? ?Transition of Care (TOC) CM/SW Contact  ?Janae Bridgeman, RN ?Phone Number: ?06/20/2021, 12:37 PM ? ?Clinical Narrative:    ?Cm spoke with Melissa, CM at [facility] and the facility is unable to offer an admission bed to the patient.  CM and MSW with DTP Team will continue to follow the patient for ALF placement. ? ? ?Expected Discharge Plan: Assisted Living ?Barriers to Discharge: Family Issues, Unsafe home situation ? ?Expected Discharge Plan and Services ?Expected Discharge Plan: Assisted Living ?In-house Referral: [facility] ([facility] pending) ?Discharge Planning Services: CM Consult ?  ?Living arrangements for the past 2 months: Single Family Home ?                ?  ?  ?  ?  ?  ?  ?  ?  ?  ?  ? ? ?Social Determinants of Health (SDOH) Interventions ?  ? ?Readmission Risk Interventions ?No flowsheet data found. ? ?

## 2021-06-21 DIAGNOSIS — B1910 Unspecified viral hepatitis B without hepatic coma: Secondary | ICD-10-CM

## 2021-06-21 DIAGNOSIS — R5381 Other malaise: Secondary | ICD-10-CM | POA: Diagnosis not present

## 2021-06-21 DIAGNOSIS — D649 Anemia, unspecified: Secondary | ICD-10-CM | POA: Diagnosis not present

## 2021-06-21 DIAGNOSIS — E119 Type 2 diabetes mellitus without complications: Secondary | ICD-10-CM | POA: Diagnosis not present

## 2021-06-21 LAB — GLUCOSE, CAPILLARY
Glucose-Capillary: 130 mg/dL — ABNORMAL HIGH (ref 70–99)
Glucose-Capillary: 119 mg/dL — ABNORMAL HIGH (ref 70–99)
Glucose-Capillary: 148 mg/dL — ABNORMAL HIGH (ref 70–99)
Glucose-Capillary: 117 mg/dL — ABNORMAL HIGH (ref 70–99)

## 2021-06-21 NOTE — Progress Notes (Signed)
Mobility Specialist Progress Note: ? ? 06/21/21 1200  ?Mobility  ?Activity Ambulated with assistance in hallway  ?Level of Assistance Independent  ?Assistive Device None  ?Distance Ambulated (ft) 1120 ft  ?Activity Response Tolerated well  ?$Mobility charge 1 Mobility  ? ?Pt received in bed willing to participate in mobility. Complaints of foot pain. Left in bed with call bell in reach and all needs met.  ? ?  ?Mobility Specialist ?Primary Phone [phone] ? ?

## 2021-06-21 NOTE — Progress Notes (Signed)
?PROGRESS NOTE ? ? ? ?Garrett [name]  [id number] DOB: [birth year] DOA: 03/04/2021 ?PCP: Charlott Rakes, MD  ?Outpatient Specialists:  ? ? ? ?Brief Narrative:  ?Patient is a 36 years old male with past medical history significant for type 2 diabetes mellitus, orthostatic hypotension, severe depression and psychosis.  Patient has been awaiting placement for over 2 months.  No new changes today. ? ? ?Assessment & Plan: ?  ?Active Problems: ?  MDD (major depressive disorder), recurrent, severe, with psychosis ([place]) ?  Controlled type 2 diabetes mellitus without complication, with long-term current use of insulin ([place]) ?  Anemia ?  Physical deconditioning ?  Chronic Hepatitis B ?  Low back pain ?  Generalized weakness ? ?Controlled type 2 diabetes mellitus without complication, with long-term current use of insulin ([place]) ?Hga1c of 5.9 04/2021 ?HOME MEDS: Lantus 40 units daily and Metformin XR 500 mg daily ?Stopped metformin 2/2 diarrhea.   ?Continue Tradjenta, Glucotrol and Semglee ?CBGs remain well controlled  ?  ?  ?Chronic Hepatitis B ?Appreciate GI assistance-suspect chronic Hep B with worsening of LFTs in the setting of ischemic injury during orthostasis vs. Drug induced liver injury (risperdal) ?After DC needs referral to ID for management of chronic hep B ?AFP tumor marker slightly elevated at 10 c/w acute exacerbation and hepatitis process -CT abdomen without any solid liver masses ?GI recommends outpatient follow-up at [facility] hepatology clinic versus with ID here in [place]. Liver biopsy should be considered. ?-Although may be a candidate for hepatitis B treatment would need to be initiated in the outpatient setting with subsequent insurance authorization to avoid interruption in treatment.  Also has severe depression which may influence decision to initiate hepatitis B medical therapy. ?  ?  ?Orthostatic hypotension likely 2/2 autonomic dysfunction-resolved as of 06/01/2021 ?Has had episodic orthostasis while in  ED during the past 2 months and received intermittent IV fluids ?TSH normal, cortisol checked on 04/14/2021 low at 3.1 but cosyntropin stim test on 2/6 baseline cortisol 11 which ruled out adrenal insufficiency ?-continue TED hose ?-Cont Midodrine ?  ?MDD (major depressive disorder), recurrent, severe, with psychosis ([place]) ?Appreciate psychiatry assistance ?Continue lexapro and cogentin ?Risperidone discontinued 2/2 elevated LFTs and Invega initiated by the psychiatric team ?Social isolation due to language barrier remains a significant problem and is likely influencing his depression ?  ?  ?  ?Acute urinary retention-resolved as of 06/01/2021 ?Resolved ?  ?Anemia ?Iron panel: Ferritin 696.  B12 in December 2021: 750 and folate 16.5. ?Hemoglobin stable in the 11 g range.  Outpatient follow-up ?  ?Thrombocytopenia (HCC)-resolved as of 06/01/2021 ?Platelets stable and > 100,000 ?  ?Physical deconditioning ?PT initially recommended SNF but patient mobility has significantly improved although he still has some episodes of dizziness. ? ?DVT prophylaxis: SCD. ?Code Status: Full code ?Family Communication:  ?Disposition Plan: To be decided ? ? ?Consultants:  ?None ? ?Procedures:  ?None ? ?Antimicrobials:  ?None ? ? ?Subjective: ?No new complaints. ? ?Objective: ?Vitals:  ? 06/20/21 1955 06/21/21 0431 06/21/21 0807 06/21/21 1555  ?BP: (!) 130/94 110/66 115/78 114/66  ?Pulse: 99 91 90 100  ?Resp: 18 18 18 18   ?Temp: 98.1 ?F (36.7 ?C) 98.3 ?F (36.8 ?C) 98.2 ?F (36.8 ?C) 97.8 ?F (36.6 ?C)  ?TempSrc: Oral Oral    ?SpO2: 100% 98% 99% 98%  ?Weight:      ?Height:      ? ? ?Intake/Output Summary (Last 24 hours) at 06/21/2021 1721 ?Last data filed at 06/21/2021 1330 ?Gross per 24  hour  ?Intake 460 ml  ?Output --  ?Net 460 ml  ? ?Filed Weights  ? 05/18/21 0811  ?Weight: 49.3 kg  ? ? ?Examination: ? ?General exam: Appears calm and comfortable  ?Respiratory system: Clear to auscultation.  ?Cardiovascular system: S1 & S2  ?Gastrointestinal  system: Abdomen is nondistended, soft and nontender.  ?Central nervous system: Alert and oriented.  ?Extremities: No leg edema. ? ?Data Reviewed: I have personally reviewed following labs and imaging studies ? ?CBC: ?No results for input(s): WBC, NEUTROABS, HGB, HCT, MCV, PLT in the last 168 hours. ?Basic Metabolic Panel: ?No results for input(s): NA, K, CL, CO2, GLUCOSE, BUN, CREATININE, CALCIUM, MG, PHOS in the last 168 hours. ?GFR: ?Estimated Creatinine Clearance: 87.3 mL/min (by C-G formula based on SCr of 0.61 mg/dL). ?Liver Function Tests: ?No results for input(s): AST, ALT, ALKPHOS, BILITOT, PROT, ALBUMIN in the last 168 hours. ?No results for input(s): LIPASE, AMYLASE in the last 168 hours. ?No results for input(s): AMMONIA in the last 168 hours. ?Coagulation Profile: ?No results for input(s): INR, PROTIME in the last 168 hours. ?Cardiac Enzymes: ?No results for input(s): CKTOTAL, CKMB, CKMBINDEX, TROPONINI in the last 168 hours. ?BNP (last 3 results) ?No results for input(s): PROBNP in the last 8760 hours. ?HbA1C: ?No results for input(s): HGBA1C in the last 72 hours. ?CBG: ?Recent Labs  ?Lab 06/20/21 ?1651 06/20/21 ?2007 06/21/21 ?0804 06/21/21 ?1120 06/21/21 ?1611  ?GLUCAP 150* 182* 130* 119* 148*  ? ?Lipid Profile: ?No results for input(s): CHOL, HDL, LDLCALC, TRIG, CHOLHDL, LDLDIRECT in the last 72 hours. ?Thyroid Function Tests: ?No results for input(s): TSH, T4TOTAL, FREET4, T3FREE, THYROIDAB in the last 72 hours. ?Anemia Panel: ?No results for input(s): VITAMINB12, FOLATE, FERRITIN, TIBC, IRON, RETICCTPCT in the last 72 hours. ?Urine analysis: ?   ?Component Value Date/Time  ? [place] YELLOW 05/05/2021 1744  ? APPEARANCEUR CLEAR 05/05/2021 1744  ? LABSPEC 1.010 05/05/2021 1744  ? PHURINE 7.0 05/05/2021 1744  ? GLUCOSEU NEGATIVE 05/05/2021 1744  ? [place] NEGATIVE 05/05/2021 1744  ? [place] NEGATIVE 05/05/2021 1744  ? BILIRUBINUR negative 07/01/2020 1042  ? [place] NEGATIVE 05/05/2021 1744  ?  [place] NEGATIVE 05/05/2021 1744  ? UROBILINOGEN negative (A) 07/01/2020 1042  ? NITRITE NEGATIVE 05/05/2021 1744  ? LEUKOCYTESUR NEGATIVE 05/05/2021 1744  ? ?Sepsis Labs: ?@[email or web address] (procalcitonin:4,lacticidven:4) ? ?)No results found for this or any previous visit (from the past 240 hour(s)).  ? ? ? ? ? ?Radiology Studies: ?No results found. ? ? ? ? ? ?Scheduled Meds: ? benztropine  0.5 mg Oral BID  ? escitalopram  10 mg Oral Daily  ? feeding supplement (GLUCERNA SHAKE)  237 mL Oral TID BM  ? glipiZIDE  5 mg Oral QAC breakfast  ? insulin aspart  0-6 Units Subcutaneous TID WC  ? insulin glargine-yfgn  15 Units Subcutaneous Daily  ? linagliptin  5 mg Oral Daily  ? LORazepam  0.5 mg Oral QHS  ? midodrine  10 mg Oral TID WC  ? paliperidone  3 mg Oral QHS  ? polyethylene glycol  17 g Oral Daily  ? selenium sulfide   Topical Daily  ? senna-docusate  1 tablet Oral QHS  ? sodium chloride flush  3 mL Intravenous Q12H  ? thiamine  100 mg Oral Daily  ? ?Continuous Infusions: ? sodium chloride    ? ? ? LOS: 47 days  ? ? ?Time spent: 25 minutes. ? ? ? ?Dana Allan, MD  ?[facility] ?Pager #: [phone] ?7PM-7AM contact night coverage  as above ? ? ?  ?

## 2021-06-21 NOTE — Progress Notes (Addendum)
CSW sent patient's clinicals to [place] for review. ? ?Madilyn Fireman, MSW, LCSW ?Transitions of Care  Clinical Social Worker II ?[phone] ? ?

## 2021-06-22 DIAGNOSIS — E119 Type 2 diabetes mellitus without complications: Secondary | ICD-10-CM | POA: Diagnosis not present

## 2021-06-22 DIAGNOSIS — R5381 Other malaise: Secondary | ICD-10-CM | POA: Diagnosis not present

## 2021-06-22 DIAGNOSIS — Z794 Long term (current) use of insulin: Secondary | ICD-10-CM | POA: Diagnosis not present

## 2021-06-22 DIAGNOSIS — F333 Major depressive disorder, recurrent, severe with psychotic symptoms: Secondary | ICD-10-CM | POA: Diagnosis not present

## 2021-06-22 LAB — GLUCOSE, CAPILLARY
Glucose-Capillary: 144 mg/dL — ABNORMAL HIGH (ref 70–99)
Glucose-Capillary: 150 mg/dL — ABNORMAL HIGH (ref 70–99)
Glucose-Capillary: 217 mg/dL — ABNORMAL HIGH (ref 70–99)
Glucose-Capillary: 172 mg/dL — ABNORMAL HIGH (ref 70–99)

## 2021-06-22 NOTE — Plan of Care (Signed)
  Problem: Education: Goal: Knowledge of General Education information will improve Description: Including pain rating scale, medication(s)/side effects and non-pharmacologic comfort measures Outcome: Progressing   Problem: Clinical Measurements: Goal: Ability to maintain clinical measurements within normal limits will improve Outcome: Progressing   

## 2021-06-22 NOTE — Progress Notes (Signed)
Mobility Specialist Progress Note: ? ? 06/22/21 1214  ?Mobility  ?Activity Ambulated with assistance in hallway  ?Level of Assistance Independent  ?Distance Ambulated (ft) 1120 ft  ?Activity Response Tolerated well  ?$Mobility charge 1 Mobility  ? ?Pt received in bed willing to participate in mobility. Complaints of foot pain. Left in bed with call bell in reach and all needs met.  ? ?Rondi Ivy ?Mobility Specialist ?Primary Phone [phone] ? ?

## 2021-06-22 NOTE — Progress Notes (Signed)
?Progress Note ? ? ?PatientZebulen [name] ZRA:[id number] DOB: 1985/09/22 DOA: 03/04/2021     48 ?DOS: the patient was seen and examined on 06/22/2021 ?Needs interpreter/speaks [name] ?  ?Brief hospital course: ?Garrett [name] is a 36 y.o. male with medical history significant of severe depression with psychosis, T2DM and orthostatic hypotension who had been holding in ED for about 2 months time waiting on placement. He was being cared for by members in the community, but they have returned home and he no longer has care. He has been boarding in ED with some hypoglycemic episodes, diarrhea (resolved after metformin adjusted) and some orthostatic hypotension that continues to persist despite stopping his antihypertensive medication as well as addition of SSRI/mood stabilizing drugs.  While in the ER he also had an isolated episode of hyperglycemia with serum glucose 503 with a normal anion gap.  Not consistent with DKA.  Labs were checked and he was noted to have significantly elevated LFTs and with concomitant orthostasis he was referred for admission.  Currently, social work continues to work on placement.  He continues to have significant orthostasis that is limiting his ability to ambulate. ?  ? ? ?Assessment and Plan: ?Controlled type 2 diabetes mellitus without complication, with long-term current use of insulin (HCC) ?Hga1c of 5.9 04/2021 ?HOME MEDS: Lantus 40 units daily and Metformin XR 500 mg daily ?Stopped metformin 2/2 diarrhea.   ?Continue Tradjenta, Glucotrol and Semglee ?CBGs remain well controlled  ? ? ?Chronic Hepatitis B ?Appreciate GI assistance-they suspected chronic Hep B with worsening of LFTs in the setting of ischemic injury during orthostasis vs. Drug induced liver injury (risperdal) ?After DC needs referral to ID for management of chronic hep B ?AFP tumor marker slightly elevated at 10 c/w acute exacerbation and hepatitis process -CT abdomen without any solid liver masses ?GI recommends outpatient  follow-up at [facility] hepatology clinic vs with ID here in [place].  ?-Although may be a candidate for hepatitis B treatment would need to be initiated in the outpatient setting with subsequent insurance authorization to avoid interruption in treatment.  Also has severe depression which may influence decision to initiate hepatitis B medical therapy. ? ? ?Orthostatic hypotension likely 2/2 autonomic dysfunction-resolved as of 06/01/2021 ?Has had episodic orthostasis while in ED during the past 2 months and received intermittent IV fluids ?TSH normal, cortisol checked on 04/14/2021 low at 3.1 but cosyntropin stim test on 2/6 baseline cortisol 11 which ruled out adrenal insufficiency ?-continue TED hose ?-Cont Midodrine ? ?MDD (major depressive disorder), recurrent, severe, with psychosis (HCC) ?Appreciate psychiatry assistance ?Stable on lexapro and cogentin ?Risperidone discontinued 2/2 elevated LFTs and Invega initiated by the psychiatric team ?Social isolation due to language barrier remains a significant problem and is likely influencing his depression ? ? ? ?Acute urinary retention-resolved as of 06/01/2021 ?Resolved ? ?Anemia ?Iron panel: Ferritin 696.  B12 in December 2021: 750 and folate 16.5. ?Hemoglobin stable in the 12 g range (3/11).  Outpatient follow-up ? ?Thrombocytopenia (HCC)-resolved as of 06/01/2021 ?Platelets stable and > 100,000 ? ?Physical deconditioning ?Able to ambulate well.  Primarily limited by deconditioning preference to stay in the bed most days ? ?A physical therapy consult is indicated based on the patient?s mobility assessment. ? ? ?Mobility Assessment (last 72 hours)   ? ? Mobility Assessment   ? ? Row Name 05/30/21 2000 05/29/21 1925 05/29/21 0800 05/28/21 1955  ?  ? Does patient have an order for bedrest or is patient medically unstable No - Continue assessment No -  Continue assessment No - Continue assessment No - Continue assessment   ? What is the highest level of mobility based on the  progressive mobility assessment? Level 6 (Walks independently in room and hall) - Balance while walking in room without assist - Complete Level 6 (Walks independently in room and hall) - Balance while walking in room without assist - Complete Level 6 (Walks independently in room and hall) - Balance while walking in room without assist - Complete Level 6 (Walks independently in room and hall) - Balance while walking in room without assist - Complete   ? Is the above level different from baseline mobility prior to current illness? No - Consider discontinuing PT/OT No - Consider discontinuing PT/OT No - Consider discontinuing PT/OT No - Consider discontinuing PT/OT   ? ?  ?  ? ?  ? ? ? ?Low back pain ?Controlled on prn Robaxin and Tylenol ?Continue to encourage increased mobility especially outside of room to minimize back pain ?Patient complaining of radicular pain.  CT of pelvis and lumbar spine unremarkable and remained stable with no evidence of nerve root encroachment ?Continue to encourage patient to get out of bed and mobilize to minimize back pain. ? ?Elevated LFTs ?See Hepatitis B notation ? ?Constipation ?Documented bowel movement on 3/1 ?Continue daily MiraLAX and Senokot ?Give one-time dose of milk of magnesia on 3/6 ? ? ? ?Subjective:  ?Reporting continued issues with low back and leg pain.  When further investigated patient stated he had just taken some pain medication after asking the nurse for something for pain ? ?Physical Exam: ?Vitals:  ? 06/21/21 1555 06/21/21 2019 06/22/21 0429 06/22/21 0758  ?BP: 114/66 120/79 105/67 (!) 90/57  ?Pulse: 100 97 96 95  ?Resp: 18 18 20 16   ?Temp: 97.8 ?F (36.6 ?C) 98.3 ?F (36.8 ?C) 98.3 ?F (36.8 ?C) 98.1 ?F (36.7 ?C)  ?TempSrc:  Oral Oral Oral  ?SpO2: 98% 98% 99% 98%  ?Weight:      ?Height:      ? ?General: Calm, NAD ?Respiratory: Lungs are CTA, normal respiratory effort, room air ?Cardiovascular: S1 S2, regular pulse. TED hose  ?Gastrointestinal: Abdomen is  nondistended, soft and nontender.  Positive bowel sounds noted.  LBM 3/20 ?Neurological: Cranial nerves II through XII are grossly intact, patient able to move all extremities x4 with strength 5/5.  Sensation is intact ?Psychiatry: Judgement and insight intact.  Mood & affect flat (interpreter utilized for exam) ? ?Data Reviewed: ?There are no new results to review at this time. ? ? DVT Prophylaxis   ?., Place ted hose  ?Place ted hose  ? ? ?Family Communication:  ?No family available noting they have all returned to 4/20 ? ?Disposition: ?Remains inpatient appropriate because: Unsafe discharge plan-deconditioned with ambulation concerns primarily related to recurrent orthostasis-also uncontrolled diabetes at presentation; severe depression along with language barrier contributing to patient's ability for self-care-care be recommends short-term SNF for rehabilitation; also patient does not have a way to pay for SNF or rehab ?Please review any new documentation from CM/LCSW ? ? ? ? ?Planned Discharge Destination:  ?Family care home/Rest home or ALF ? ? ?COVID vaccination status:  ?Unknown ? ?Consultants: ?Psychiatry ?Gastroenterology ?Procedures: ?Echocardiogram ?Antibiotics: ?None ? ? ? ?Time spent: 15 minutes ? ?Author: ?[place], NP ?06/22/2021 12:19 PM ? ?For on call review [email or web address].  ? ?

## 2021-06-22 NOTE — Progress Notes (Addendum)
11am: ?Patient denied admission at [facility]. ? ?9am: ?CSW sent patient's clinicals to [place] for review. ? ?Edwin Dada, MSW, LCSW ?Transitions of Care  Clinical Social Worker II ?[phone] ? ?

## 2021-06-23 DIAGNOSIS — E119 Type 2 diabetes mellitus without complications: Secondary | ICD-10-CM | POA: Diagnosis not present

## 2021-06-23 DIAGNOSIS — F333 Major depressive disorder, recurrent, severe with psychotic symptoms: Secondary | ICD-10-CM | POA: Diagnosis not present

## 2021-06-23 DIAGNOSIS — Z794 Long term (current) use of insulin: Secondary | ICD-10-CM | POA: Diagnosis not present

## 2021-06-23 DIAGNOSIS — R5381 Other malaise: Secondary | ICD-10-CM | POA: Diagnosis not present

## 2021-06-23 LAB — GLUCOSE, CAPILLARY
Glucose-Capillary: 111 mg/dL — ABNORMAL HIGH (ref 70–99)
Glucose-Capillary: 146 mg/dL — ABNORMAL HIGH (ref 70–99)
Glucose-Capillary: 160 mg/dL — ABNORMAL HIGH (ref 70–99)
Glucose-Capillary: 134 mg/dL — ABNORMAL HIGH (ref 70–99)
Glucose-Capillary: 113 mg/dL — ABNORMAL HIGH (ref 70–99)

## 2021-06-23 NOTE — Progress Notes (Signed)
?Progress Note ? ? ?PatientLukasz [name] DHR:[id number] DOB: 07-24-1985 DOA: 03/04/2021     49 ?DOS: the patient was seen and examined on 06/23/2021 ?Needs interpreter/speaks [name] ?  ?Brief hospital course: ?Garrett [name] is a 36 y.o. male with medical history significant of severe depression with psychosis, T2DM and orthostatic hypotension who had been holding in ED for about 2 months time waiting on placement. He was being cared for by members in the community, but they have returned home and he no longer has care. He has been boarding in ED with some hypoglycemic episodes, diarrhea (resolved after metformin adjusted) and some orthostatic hypotension that continues to persist despite stopping his antihypertensive medication as well as addition of SSRI/mood stabilizing drugs.  While in the ER he also had an isolated episode of hyperglycemia with serum glucose 503 with a normal anion gap.  Not consistent with DKA.  Labs were checked and he was noted to have significantly elevated LFTs and with concomitant orthostasis he was referred for admission.  Currently, social work continues to work on placement.  He continues to have significant orthostasis that is limiting his ability to ambulate. ?  ? ? ?Assessment and Plan: ?Controlled type 2 diabetes mellitus without complication, with long-term current use of insulin (HCC) ?Hga1c of 5.9 04/2021 ?HOME MEDS: Lantus 40 units daily and Metformin XR 500 mg daily ?Stopped metformin 2/2 diarrhea.   ?Continue Tradjenta, Glucotrol and Semglee ?CBGs remain well controlled  ? ? ?Chronic Hepatitis B ?Appreciate GI assistance-they suspected chronic Hep B with worsening of LFTs in the setting of ischemic injury during orthostasis vs. Drug induced liver injury (risperdal) ?After DC needs referral to ID for management of chronic hep B ?AFP tumor marker slightly elevated at 10 c/w acute exacerbation and hepatitis process -CT abdomen without any solid liver masses ?GI recommends outpatient  follow-up at [facility] hepatology clinic vs with ID here in [place].  ?-Although may be a candidate for hepatitis B treatment would need to be initiated in the outpatient setting with subsequent insurance authorization to avoid interruption in treatment.  Also has severe depression which may influence decision to initiate hepatitis B medical therapy. ? ? ?Orthostatic hypotension likely 2/2 autonomic dysfunction-resolved as of 06/01/2021 ?Has had episodic orthostasis while in ED during the past 2 months and received intermittent IV fluids ?TSH normal, cortisol checked on 04/14/2021 low at 3.1 but cosyntropin stim test on 2/6 baseline cortisol 11 which ruled out adrenal insufficiency ?-continue TED hose ?-Cont Midodrine ? ?MDD (major depressive disorder), recurrent, severe, with psychosis (HCC) ?Appreciate psychiatry assistance ?Stable on lexapro and cogentin ?Risperidone discontinued 2/2 elevated LFTs and Invega initiated by the psychiatric team ?Social isolation due to language barrier remains a significant problem and is likely influencing his depression ? ? ? ?Acute urinary retention-resolved as of 06/01/2021 ?Resolved ? ?Anemia ?Iron panel: Ferritin 696.  B12 in December 2021: 750 and folate 16.5. ?Hemoglobin stable in the 12 g range (3/11).  Outpatient follow-up ? ?Thrombocytopenia (HCC)-resolved as of 06/01/2021 ?Platelets stable and > 100,000 ? ?Physical deconditioning ?Able to ambulate well.  Primarily limited by deconditioning preference to stay in the bed most days ? ?A physical therapy consult is indicated based on the patient?s mobility assessment. ? ? ?Mobility Assessment (last 72 hours)   ? ? Mobility Assessment   ? ? Row Name 05/30/21 2000 05/29/21 1925 05/29/21 0800 05/28/21 1955  ?  ? Does patient have an order for bedrest or is patient medically unstable No - Continue assessment No -  Continue assessment No - Continue assessment No - Continue assessment   ? What is the highest level of mobility based on the  progressive mobility assessment? Level 6 (Walks independently in room and hall) - Balance while walking in room without assist - Complete Level 6 (Walks independently in room and hall) - Balance while walking in room without assist - Complete Level 6 (Walks independently in room and hall) - Balance while walking in room without assist - Complete Level 6 (Walks independently in room and hall) - Balance while walking in room without assist - Complete   ? Is the above level different from baseline mobility prior to current illness? No - Consider discontinuing PT/OT No - Consider discontinuing PT/OT No - Consider discontinuing PT/OT No - Consider discontinuing PT/OT   ? ?  ?  ? ?  ? ? ? ?Low back pain ?Controlled on prn Robaxin and Tylenol ?Continue to encourage increased mobility especially outside of room to minimize back pain ?Patient complaining of radicular pain.  CT of pelvis and lumbar spine unremarkable and remained stable with no evidence of nerve root encroachment ?Continue to encourage patient to get out of bed and mobilize to minimize back pain. ? ?Elevated LFTs ?See Hepatitis B notation ? ?Constipation ?Documented bowel movement on 3/1 ?Continue daily MiraLAX and Senokot ?Give one-time dose of milk of magnesia on 3/6 ? ? ? ?Subjective:  ?No complaints verbalized.  Once again requesting a hospital gown. ? ?Physical Exam: ?Vitals:  ? 06/22/21 0758 06/22/21 1616 06/22/21 1955 06/23/21 0509  ?BP: (!) 90/57 132/81 (!) 133/94 93/68  ?Pulse: 95 93 91 92  ?Resp: 16 18 18 17   ?Temp: 98.1 ?F (36.7 ?C) 98.1 ?F (36.7 ?C) 98.1 ?F (36.7 ?C) (!) 97.3 ?F (36.3 ?C)  ?TempSrc: Oral  Oral Oral  ?SpO2: 98% 100% 100% 99%  ?Weight:      ?Height:      ? ?General: Calm, NAD ?Respiratory: Stable on room air, anterior lung sounds clear to auscultation, respiratory effort and pattern normal ?Cardiovascular: Normotensive, S1 S2, regular pulse.  ?Gastrointestinal: Abdomen is nondistended, soft and nontender.  Positive bowel sounds  noted.  LBM 3/23 ?Neurological: Cranial nerves II through XII are grossly intact, patient able to move all extremities x4 with strength 5/5.  Sensation is intact ?Psychiatry: Judgement and insight intact.  Mood & affect flat (interpreter utilized for exam) ? ?Data Reviewed: ?There are no new results to review at this time. ? ? DVT Prophylaxis   ?., Place ted hose  ?Place ted hose  ? ? ?Family Communication:  ?No family available noting they have all returned to 4/23 ? ?Disposition: ?Remains inpatient appropriate because: Unsafe discharge plan-deconditioned with ambulation concerns primarily related to recurrent orthostasis-also uncontrolled diabetes at presentation; severe depression along with language barrier contributing to patient's ability for self-care-care be recommends short-term SNF for rehabilitation; also patient does not have a way to pay for SNF or rehab ?Please review any new documentation from CM/LCSW ? ? ? ? ?Planned Discharge Destination:  ?Family care home/Rest home or ALF ? ? ?COVID vaccination status:  ?Unknown ? ?Consultants: ?Psychiatry ?Gastroenterology ?Procedures: ?Echocardiogram ?Antibiotics: ?None ? ? ? ?Time spent: 15 minutes ? ?Author: ?[place], NP ?06/23/2021 8:13 AM ? ?For on call review [email or web address].  ? ?

## 2021-06-23 NOTE — Progress Notes (Signed)
Mobility Specialist Progress Note: ? ? 06/23/21 1215  ?Mobility  ?Activity Ambulated with assistance in hallway  ?Level of Assistance Independent  ?Assistive Device None  ?Distance Ambulated (ft) 1120 ft  ?Activity Response Tolerated well  ?$Mobility charge 1 Mobility  ? ?Pt received in bed willing to participate in mobility. No complaints of pain. Pt left in bed with call bell in reach and all needs met.  ? ?Garrett [name] ?Mobility Specialist ?Primary Phone [phone] ? ?

## 2021-06-24 DIAGNOSIS — F333 Major depressive disorder, recurrent, severe with psychotic symptoms: Secondary | ICD-10-CM | POA: Diagnosis not present

## 2021-06-24 DIAGNOSIS — E119 Type 2 diabetes mellitus without complications: Secondary | ICD-10-CM | POA: Diagnosis not present

## 2021-06-24 DIAGNOSIS — R5381 Other malaise: Secondary | ICD-10-CM | POA: Diagnosis not present

## 2021-06-24 DIAGNOSIS — M545 Low back pain, unspecified: Secondary | ICD-10-CM | POA: Diagnosis not present

## 2021-06-24 LAB — GLUCOSE, CAPILLARY
Glucose-Capillary: 100 mg/dL — ABNORMAL HIGH (ref 70–99)
Glucose-Capillary: 78 mg/dL (ref 70–99)
Glucose-Capillary: 221 mg/dL — ABNORMAL HIGH (ref 70–99)
Glucose-Capillary: 122 mg/dL — ABNORMAL HIGH (ref 70–99)

## 2021-06-24 NOTE — Progress Notes (Signed)
?Progress Note ? ? ?PatientArien [name] ENI:[id number] DOB: 1985-06-28 DOA: 03/04/2021     50 ?DOS: the patient was seen and examined on 06/24/2021 ?Needs interpreter/speaks [name] ?  ?Brief hospital course: ?Garrett [name] is a 36 y.o. male with medical history significant of severe depression with psychosis, T2DM and orthostatic hypotension who had been holding in ED for about 2 months time waiting on placement. He was being cared for by members in the community, but they have returned home and he no longer has care. He has been boarding in ED with some hypoglycemic episodes, diarrhea (resolved after metformin adjusted) and some orthostatic hypotension that continues to persist despite stopping his antihypertensive medication as well as addition of SSRI/mood stabilizing drugs.  While in the ER he also had an isolated episode of hyperglycemia with serum glucose 503 with a normal anion gap.  Not consistent with DKA.  Labs were checked and he was noted to have significantly elevated LFTs and with concomitant orthostasis he was referred for admission.  Currently, social work continues to work on placement.  He continues to have significant orthostasis that is limiting his ability to ambulate. ?  ? ? ?Assessment and Plan: ?Controlled type 2 diabetes mellitus without complication, with long-term current use of insulin (HCC) ?Hga1c of 5.9 04/2021 ?HOME MEDS: Lantus 40 units daily and Metformin XR 500 mg daily ?Dc'd metformin 2/2 diarrhea.   ?CBGs well controlled on Tradjenta, Glucotrol and Semglee ? ? ? ?Chronic Hepatitis B ?GI consulted-they suspected chronic Hep B with worsening of LFTs in the setting of ischemic injury during orthostasis vs. Drug induced liver injury (risperdal) ?After DC needs referral to ID for management of chronic hep B ?AFP tumor marker slightly elevated at 10 c/w acute exacerbation and hepatitis process -CT abdomen without any solid liver masses ?GI recommends outpatient follow-up at [facility] hepatology  clinic vs with ID here in [place].  ?-Although may be a candidate for hepatitis B treatment would need to be initiated in the outpatient setting with subsequent insurance authorization to avoid interruption in treatment.  Also has severe depression which may influence decision to initiate hepatitis B medical therapy. ? ? ?Orthostatic hypotension likely 2/2 autonomic dysfunction-resolved as of 06/01/2021 ?Has had episodic orthostasis while in ED during the past 2 months and received intermittent IV fluids ?TSH normal, cortisol checked on 04/14/2021 low at 3.1 but cosyntropin stim test on 2/6 baseline cortisol 11 which ruled out adrenal insufficiency ?-continue TED hose ?-Cont Midodrine ? ?MDD (major depressive disorder), recurrent, severe, with psychosis (HCC) ?Appreciate psychiatry assistance ?Stable on lexapro and cogentin ?Risperidone discontinued 2/2 elevated LFTs and Invega initiated by the psychiatric team ?Social isolation due to language barrier remains a significant problem and is likely influencing his depression ? ? ? ?Acute urinary retention-resolved as of 06/01/2021 ?Resolved ? ?Anemia ?Iron panel: Ferritin 696.  B12 in December 2021: 750 and folate 16.5. ?Hemoglobin stable in the 12 g range (3/11).  Outpatient follow-up ? ?Thrombocytopenia (HCC)-resolved as of 06/01/2021 ?Platelets stable and > 100,000 ? ?Physical deconditioning ?Resolved.  Able to ambulate well.  Primarily limited by deconditioning preference to stay in the bed most days ? ?A physical therapy consult is indicated based on the patient?s mobility assessment. ? ? ?Mobility Assessment (last 72 hours)   ? ? Mobility Assessment   ? ? Row Name 05/30/21 2000 05/29/21 1925 05/29/21 0800 05/28/21 1955  ?  ? Does patient have an order for bedrest or is patient medically unstable No - Continue assessment No -  Continue assessment No - Continue assessment No - Continue assessment   ? What is the highest level of mobility based on the progressive  mobility assessment? Level 6 (Walks independently in room and hall) - Balance while walking in room without assist - Complete Level 6 (Walks independently in room and hall) - Balance while walking in room without assist - Complete Level 6 (Walks independently in room and hall) - Balance while walking in room without assist - Complete Level 6 (Walks independently in room and hall) - Balance while walking in room without assist - Complete   ? Is the above level different from baseline mobility prior to current illness? No - Consider discontinuing PT/OT No - Consider discontinuing PT/OT No - Consider discontinuing PT/OT No - Consider discontinuing PT/OT   ? ?  ?  ? ?  ? ? ? ?Low back pain ?Controlled on prn Robaxin and Tylenol ?Continue to encourage increased mobility especially outside of room to minimize back pain ?Patient complaining of radicular pain.  CT of pelvis and lumbar spine unremarkable and remained stable with no evidence of nerve root encroachment ?Continue to encourage patient to get out of bed and mobilize to minimize back pain. ? ?Elevated LFTs ?See Hepatitis B notation ? ?Constipation ?Documented bowel movement on 3/1 ?Continue daily MiraLAX and Senokot ?Give one-time dose of milk of magnesia on 3/6 ? ? ? ?Subjective:  ?Has just completed eating breakfast.  Still hungry and requesting a snack.  Continues to have some mild low back pain that improves with use of as needed medications. ? ?Physical Exam: ?Vitals:  ? 06/23/21 1714 06/23/21 2000 06/24/21 0506 06/24/21 0749  ?BP: 117/89 (!) 132/94 100/69 93/65  ?Pulse: 95 89 92 94  ?Resp: 18  18 18   ?Temp: 98 ?F (36.7 ?C) 97.9 ?F (36.6 ?C) 97.8 ?F (36.6 ?C) 97.9 ?F (36.6 ?C)  ?TempSrc: Oral Oral Oral Oral  ?SpO2: 100% 100% 100% 99%  ?Weight:      ?Height:      ? ?General: Calm, NAD ?Respiratory: Stable on room air, anterior lung sounds clear to auscultation, respiratory effort and pattern normal ?Cardiovascular: Normotensive, S1 S2, regular pulse.   ?Gastrointestinal: Abdomen is nondistended, soft and nontender.  Positive bowel sounds noted.  LBM 3/23 ?Neurological: Cranial nerves II through XII are grossly intact, patient able to move all extremities x4 with strength 5/5.  Sensation is intact ?Psychiatry: Judgement and insight intact.  Mood & affect flat (interpreter utilized for exam) ? ?Data Reviewed: ?There are no new results to review at this time. ? ? DVT Prophylaxis   ?., Place ted hose  ?Place ted hose  ? ? ?Family Communication:  ?No family available noting they have all returned to 4/23 ? ?Disposition: ?Remains inpatient appropriate because: Unsafe discharge plan-deconditioned with ambulation concerns primarily related to recurrent orthostasis-also uncontrolled diabetes at presentation; severe depression along with language barrier contributing to patient's ability for self-care-care be recommends short-term SNF for rehabilitation; also patient does not have a way to pay for SNF or rehab ?Please review any new documentation from CM/LCSW ? ? ? ? ?Planned Discharge Destination:  ?Family care home/Rest home or ALF ? ? ?COVID vaccination status:  ?Unknown ? ?Consultants: ?Psychiatry ?Gastroenterology ?Procedures: ?Echocardiogram ?Antibiotics: ?None ? ? ? ?Time spent: 15 minutes ? ?Author: ?Reunion, NP ?06/24/2021 10:08 AM ? ?For on call review [email or web address].  ? ?

## 2021-06-24 NOTE — Progress Notes (Signed)
Mobility Specialist Progress Note: ? ? 06/24/21 1038  ?Mobility  ?Activity Ambulated with assistance in hallway  ?Level of Assistance Independent  ?Assistive Device None  ?Distance Ambulated (ft) 300 ft  ?Activity Response Tolerated well  ?$Mobility charge 1 Mobility  ? ?Pt received in bed willing to participate in mobility. Complaints of "bad" foot pain but still willing to ambulate. Left in bed with call bel in reach and all needs met.  ? ?Garrett [name] ?Mobility Specialist ?Primary Phone [phone] ? ?

## 2021-06-25 LAB — CBC WITH DIFFERENTIAL/PLATELET
Abs Immature Granulocytes: 0.02 10*3/uL (ref 0.00–0.07)
Basophils Absolute: 0 10*3/uL (ref 0.0–0.1)
Basophils Relative: 0 %
Eosinophils Absolute: 0.3 10*3/uL (ref 0.0–0.5)
Eosinophils Relative: 4 %
HCT: 37.1 % — ABNORMAL LOW (ref 39.0–52.0)
Hemoglobin: 12.3 g/dL — ABNORMAL LOW (ref 13.0–17.0)
Immature Granulocytes: 0 %
Lymphocytes Relative: 41 %
Lymphs Abs: 3.2 10*3/uL (ref 0.7–4.0)
MCH: 30.8 pg (ref 26.0–34.0)
MCHC: 33.2 g/dL (ref 30.0–36.0)
MCV: 93 fL (ref 80.0–100.0)
Monocytes Absolute: 0.8 10*3/uL (ref 0.1–1.0)
Monocytes Relative: 11 %
Neutro Abs: 3.4 10*3/uL (ref 1.7–7.7)
Neutrophils Relative %: 44 %
Platelets: 168 10*3/uL (ref 150–400)
RBC: 3.99 MIL/uL — ABNORMAL LOW (ref 4.22–5.81)
RDW: 13.4 % (ref 11.5–15.5)
WBC: 7.8 10*3/uL (ref 4.0–10.5)
nRBC: 0 % (ref 0.0–0.2)

## 2021-06-25 LAB — COMPREHENSIVE METABOLIC PANEL
ALT: 61 U/L — ABNORMAL HIGH (ref 0–44)
AST: 39 U/L (ref 15–41)
Albumin: 3.5 g/dL (ref 3.5–5.0)
Alkaline Phosphatase: 51 U/L (ref 38–126)
Anion gap: 4 — ABNORMAL LOW (ref 5–15)
BUN: 16 mg/dL (ref 6–20)
CO2: 30 mmol/L (ref 22–32)
Calcium: 9.4 mg/dL (ref 8.9–10.3)
Chloride: 106 mmol/L (ref 98–111)
Creatinine, Ser: 0.78 mg/dL (ref 0.61–1.24)
GFR, Estimated: >60 mL/min (ref >60)
Glucose, Bld: 108 mg/dL — ABNORMAL HIGH (ref 70–99)
Potassium: 4.2 mmol/L (ref 3.5–5.1)
Sodium: 140 mmol/L (ref 135–145)
Total Bilirubin: 0.5 mg/dL (ref 0.3–1.2)
Total Protein: 7.7 g/dL (ref 6.5–8.1)

## 2021-06-25 LAB — GLUCOSE, CAPILLARY
Glucose-Capillary: 108 mg/dL — ABNORMAL HIGH (ref 70–99)
Glucose-Capillary: 69 mg/dL — ABNORMAL LOW (ref 70–99)
Glucose-Capillary: 186 mg/dL — ABNORMAL HIGH (ref 70–99)
Glucose-Capillary: 189 mg/dL — ABNORMAL HIGH (ref 70–99)

## 2021-06-25 NOTE — Progress Notes (Signed)
Garrett [name] is a 36 y.o. male with medical history significant of severe depression with psychosis, T2DM and orthostatic hypotension who had been holding in ED for about 2 months time waiting on placement.  Now 112 days in the hospital awaiting placement. ?Labs 3/25 reviewed and are normal.   ?No overnight events. ?Continues to await a safe d/c plan. ?Eulogio Bear DO ?

## 2021-06-25 NOTE — Progress Notes (Signed)
Mobility Specialist: Progress Note ? ? 06/25/21 1310  ?Mobility  ?Activity Ambulated independently in hallway  ?Level of Assistance Independent  ?Assistive Device None  ?Distance Ambulated (ft) 1100 ft  ?Activity Response Tolerated well  ?$Mobility charge 1 Mobility  ? ?Pt received in bed and agreeable to ambulation. C/o LLE pain during session, no rating given. Pt back to bed after walk with call bell and phone in reach.  ? ?Garrett [name] ?Mobility Specialist ?Mobility Specialist 5 North: [phone] ?Mobility Specialist 6 North: [phone] ? ?

## 2021-06-26 LAB — GLUCOSE, CAPILLARY
Glucose-Capillary: 116 mg/dL — ABNORMAL HIGH (ref 70–99)
Glucose-Capillary: 133 mg/dL — ABNORMAL HIGH (ref 70–99)
Glucose-Capillary: 144 mg/dL — ABNORMAL HIGH (ref 70–99)
Glucose-Capillary: 124 mg/dL — ABNORMAL HIGH (ref 70–99)

## 2021-06-26 NOTE — Progress Notes (Signed)
Mobility Specialist: Progress Note ? ? 06/26/21 1502  ?Mobility  ?Activity Ambulated independently in hallway  ?Level of Assistance Independent  ?Assistive Device None  ?Distance Ambulated (ft) 1100 ft  ?Activity Response Tolerated well  ?$Mobility charge 1 Mobility  ? ?Pt received in bed and agreeable to ambulation. C/o L knee pain during ambulation, otherwise asymptomatic. Pt back to bed after walk with call bell and phone in reach.  ? ?Garrett [name] ?Mobility Specialist ?Mobility Specialist 5 North: [phone] ?Mobility Specialist 6 North: [phone] ? ?

## 2021-06-26 NOTE — Progress Notes (Addendum)
Garrett [name] is a 36 y.o. male with medical history significant of severe depression with psychosis, T2DM and orthostatic hypotension who had been holding in ED for about 2 months time waiting on placement.  Now 113 days total in the hospital awaiting placement. ?Labs 3/25 reviewed and are normal.   ? ?No overnight events. ? ?Continues to await a safe d/c plan. ? ?Marlin Canary DO ? ?

## 2021-06-27 DIAGNOSIS — F333 Major depressive disorder, recurrent, severe with psychotic symptoms: Secondary | ICD-10-CM | POA: Diagnosis not present

## 2021-06-27 DIAGNOSIS — E119 Type 2 diabetes mellitus without complications: Secondary | ICD-10-CM | POA: Diagnosis not present

## 2021-06-27 DIAGNOSIS — Z794 Long term (current) use of insulin: Secondary | ICD-10-CM | POA: Diagnosis not present

## 2021-06-27 LAB — GLUCOSE, CAPILLARY
Glucose-Capillary: 108 mg/dL — ABNORMAL HIGH (ref 70–99)
Glucose-Capillary: 118 mg/dL — ABNORMAL HIGH (ref 70–99)
Glucose-Capillary: 171 mg/dL — ABNORMAL HIGH (ref 70–99)
Glucose-Capillary: 148 mg/dL — ABNORMAL HIGH (ref 70–99)

## 2021-06-27 NOTE — Progress Notes (Signed)
Mobility Specialist Progress Note: ? ? 06/27/21 1541  ?Mobility  ?Activity Ambulated with assistance in hallway  ?Level of Assistance Independent  ?Assistive Device None  ?Distance Ambulated (ft) 1120 ft  ?Activity Response Tolerated well  ?$Mobility charge 1 Mobility  ? ?Second walk of the Isiac Breighner. Pt complained of L foot and knee pain. Left in bed with call bell in reach and all needs met.  ? ?Kyjuan Gause ?Mobility Specialist ?Primary Phone [phone] ? ?

## 2021-06-27 NOTE — Progress Notes (Signed)
Mobility Specialist Progress Note: ? ? 06/27/21 1045  ?Mobility  ?Activity Ambulated with assistance in hallway  ?Level of Assistance Independent  ?Assistive Device None  ?Distance Ambulated (ft) 800 ft  ?Activity Response Tolerated well  ?$Mobility charge 1 Mobility  ? ?Pt received in bed willing to participate in mobility. Complaints of L foot pain. Left in bed with call bell in reach and all needs met.  ? ?  ?Mobility Specialist ?Primary Phone [phone] ? ?

## 2021-06-27 NOTE — Progress Notes (Signed)
?Progress Note ? ? ?PatientLuccas [name] MCN:[id number] DOB: [birth year] DOA: 03/04/2021     53 ?DOS: the patient was seen and examined on 06/27/2021 ?Needs interpreter/speaks [name] ?  ?Brief hospital course: ?Garrett [name] is a 36 y.o. male with medical history significant of severe depression with psychosis, T2DM and orthostatic hypotension who had been holding in ED for about 2 months time waiting on placement. He was being cared for by members in the community, but they have returned home and he no longer has care. He has been boarding in ED with some hypoglycemic episodes, diarrhea (resolved after metformin adjusted) and some orthostatic hypotension that continues to persist despite stopping his antihypertensive medication as well as addition of SSRI/mood stabilizing drugs.  While in the ER he also had an isolated episode of hyperglycemia with serum glucose 503 with a normal anion gap.  Not consistent with DKA.  Labs were checked and he was noted to have significantly elevated LFTs and with concomitant orthostasis he was referred for admission.  Currently, social work continues to work on placement.  He continues to have significant orthostasis that is limiting his ability to ambulate. ?  ? ? ?Assessment and Plan: ?Controlled type 2 diabetes mellitus without complication, with long-term current use of insulin (HCC) ?Hga1c of 5.9 04/2021 ?HOME MEDS: Lantus 40 units daily and Metformin XR 500 mg daily ?Dc'd metformin 2/2 diarrhea.   ?CBGs well controlled on Tradjenta, Glucotrol and Semglee ? ? ? ?Chronic Hepatitis B ?GI consulted-they suspected chronic Hep B with worsening of LFTs in the setting of ischemic injury during orthostasis vs. Drug induced liver injury (risperdal) ?After DC needs referral to ID for management of chronic hep B ?AFP tumor marker slightly elevated at 10 c/w acute exacerbation and hepatitis process -CT abdomen without any solid liver masses ?GI recommends outpatient follow-up at [facility] hepatology  clinic vs with ID here in [place].  ?-Although may be a candidate for hepatitis B treatment would need to be initiated in the outpatient setting with subsequent insurance authorization to avoid interruption in treatment.  Also has severe depression which may influence decision to initiate hepatitis B medical therapy. ? ? ?Orthostatic hypotension likely 2/2 autonomic dysfunction-resolved as of 06/01/2021 ?Has had episodic orthostasis while in ED during the past 2 months and received intermittent IV fluids ?TSH normal, cortisol checked on 04/14/2021 low at 3.1 but cosyntropin stim test on 2/6 baseline cortisol 11 which ruled out adrenal insufficiency ?-continue TED hose ?-Cont Midodrine ? ?MDD (major depressive disorder), recurrent, severe, with psychosis (HCC) ?Appreciate psychiatry assistance ?Stable on lexapro and cogentin ?Risperidone discontinued 2/2 elevated LFTs and Invega initiated by the psychiatric team ?Social isolation due to language barrier remains a significant problem and is likely influencing his depression ? ? ? ?Acute urinary retention-resolved as of 06/01/2021 ?Resolved ? ?Anemia ?Iron panel: Ferritin 696.  B12 in December 2021: 750 and folate 16.5. ?Hemoglobin stable in the 12 g range (3/11).  Outpatient follow-up ? ?Thrombocytopenia (HCC)-resolved as of 06/01/2021 ?Platelets stable and > 100,000 ? ?Physical deconditioning ?Resolved.  Able to ambulate well.  Primarily limited by deconditioning preference to stay in the bed most days ? ?A physical therapy consult is indicated based on the patient?s mobility assessment. ? ? ?Mobility Assessment (last 72 hours)   ? ? Mobility Assessment   ? ? Row Name 05/30/21 2000 05/29/21 1925 05/29/21 0800 05/28/21 1955  ?  ? Does patient have an order for bedrest or is patient medically unstable No - Continue assessment No -  Continue assessment No - Continue assessment No - Continue assessment   ? What is the highest level of mobility based on the progressive  mobility assessment? Level 6 (Walks independently in room and hall) - Balance while walking in room without assist - Complete Level 6 (Walks independently in room and hall) - Balance while walking in room without assist - Complete Level 6 (Walks independently in room and hall) - Balance while walking in room without assist - Complete Level 6 (Walks independently in room and hall) - Balance while walking in room without assist - Complete   ? Is the above level different from baseline mobility prior to current illness? No - Consider discontinuing PT/OT No - Consider discontinuing PT/OT No - Consider discontinuing PT/OT No - Consider discontinuing PT/OT   ? ?  ?  ? ?  ? ? ? ?Low back pain ?Controlled on prn Robaxin and Tylenol ?Continue to encourage increased mobility especially outside of room to minimize back pain ?Patient complaining of radicular pain.  CT of pelvis and lumbar spine unremarkable and remained stable with no evidence of nerve root encroachment ?Continue to encourage patient to get out of bed and mobilize to minimize back pain. ? ?Elevated LFTs ?See Hepatitis B notation ? ?Constipation ?Documented bowel movement on 3/1 ?Continue daily MiraLAX and Senokot ?Give one-time dose of milk of magnesia on 3/6 ? ? ? ?Subjective:  ?Alert lying in bed.  No complaints regarding current back pain.  Requesting a snack. ? ?Physical Exam: ?Vitals:  ? 06/26/21 1621 06/26/21 2012 06/27/21 6213 06/27/21 0865  ?BP: 112/81 101/71 105/72 109/83  ?Pulse: (!) 102 98 98 90  ?Resp: 15 17 16 17   ?Temp: 98.2 ?F (36.8 ?C) 97.9 ?F (36.6 ?C) 98 ?F (36.7 ?C) 98.2 ?F (36.8 ?C)  ?TempSrc: Oral   Oral  ?SpO2: 98% 98% 98% 100%  ?Weight:      ?Height:      ? ?General: Calm, NAD ?Respiratory: Stable on room air, anterior lung sounds clear to auscultation, respiratory effort and pattern normal ?Cardiovascular: Normotensive, S1 S2, regular pulse.  ?Gastrointestinal: Abdomen is nondistended, soft and nontender.  Positive bowel sounds noted.   LBM 3/26 ?Neurological: Cranial nerves II through XII are grossly intact, patient able to move all extremities x4 with strength 5/5.  Sensation is intact ?Psychiatry: Judgement and insight intact.  Mood & affect flat (interpreter utilized for exam) ? ?Data Reviewed: ?There are no new results to review at this time. ? ? DVT Prophylaxis   ?., Place ted hose  ?Place ted hose  ? ? ?Family Communication:  ?No family available noting they have all returned to 4/26 ? ?Disposition: ?Remains inpatient appropriate because: Unsafe discharge plan-deconditioned with ambulation concerns primarily related to recurrent orthostasis-also uncontrolled diabetes at presentation; severe depression along with language barrier contributing to patient's ability for self-care-care be recommends short-term SNF for rehabilitation; also patient does not have a way to pay for SNF or rehab ?Please review any new documentation from CM/LCSW ? ? ? ? ?Planned Discharge Destination:  ?Family care home/Rest home or ALF ? ? ?COVID vaccination status:  ?Unknown ? ?Consultants: ?Psychiatry ?Gastroenterology ?Procedures: ?Echocardiogram ?Antibiotics: ?None ? ? ? ?Time spent: 15 minutes ? ?Author: ?Reunion, NP ?06/27/2021 7:58 AM ? ?For on call review [email or web address].  ? ?

## 2021-06-28 DIAGNOSIS — G629 Polyneuropathy, unspecified: Secondary | ICD-10-CM

## 2021-06-28 DIAGNOSIS — E119 Type 2 diabetes mellitus without complications: Secondary | ICD-10-CM | POA: Diagnosis not present

## 2021-06-28 DIAGNOSIS — Z794 Long term (current) use of insulin: Secondary | ICD-10-CM | POA: Diagnosis not present

## 2021-06-28 LAB — GLUCOSE, CAPILLARY
Glucose-Capillary: 103 mg/dL — ABNORMAL HIGH (ref 70–99)
Glucose-Capillary: 108 mg/dL — ABNORMAL HIGH (ref 70–99)
Glucose-Capillary: 112 mg/dL — ABNORMAL HIGH (ref 70–99)
Glucose-Capillary: 152 mg/dL — ABNORMAL HIGH (ref 70–99)
Glucose-Capillary: 110 mg/dL — ABNORMAL HIGH (ref 70–99)

## 2021-06-28 MED ORDER — GABAPENTIN 100 MG PO CAPS
100.0000 mg | ORAL_CAPSULE | Freq: Two times a day (BID) | ORAL | Status: DC
  Administered 2021-06-28 – 2021-07-12 (×29): 100 mg via ORAL
  Filled 2021-06-28 (×29): qty 1

## 2021-06-28 NOTE — Progress Notes (Signed)
Mobility Specialist Progress Note: ? ? 06/28/21 1219  ?Mobility  ?Activity Ambulated with assistance in hallway  ?Level of Assistance Independent  ?Distance Ambulated (ft) 1120 ft  ?Activity Response Tolerated well  ?$Mobility charge 1 Mobility  ? ?Pt received in bed willing to participate in mobility. No complaints of pain. Pt left in bed with call bell in reach and all needs met.  ? ?Anjulie Dipierro ?Mobility Specialist ?Primary Phone [phone] ? ?

## 2021-06-28 NOTE — Progress Notes (Signed)
?Progress Note ? ? ?PatientHernando [name] EVO:[id number] DOB: Apr 06, 1985 DOA: 03/04/2021     54 ?DOS: the patient was seen and examined on 06/28/2021 ?Needs interpreter/speaks Garrett [name] ?  ?Brief hospital course: ?Garrett [name] is a 36 y.o. male with medical history significant of severe depression with psychosis, T2DM and orthostatic hypotension who had been holding in ED for about 2 months time waiting on placement. He was being cared for by members in the community, but they have returned home and he no longer has care. He has been boarding in ED with some hypoglycemic episodes, diarrhea (resolved after metformin adjusted) and some orthostatic hypotension that continues to persist despite stopping his antihypertensive medication as well as addition of SSRI/mood stabilizing drugs.  While in the ER he also had an isolated episode of hyperglycemia with serum glucose 503 with a normal anion gap.  Not consistent with DKA.  Labs were checked and he was noted to have significantly elevated LFTs and with concomitant orthostasis he was referred for admission.  Currently, social work continues to work on placement.  He continues to have significant orthostasis that is limiting his ability to ambulate. ?  ? ? ?Assessment and Plan: ?Controlled type 2 diabetes mellitus without complication, with long-term current use of insulin (HCC) ?Hga1c of 5.9 04/2021 ?HOME MEDS: Lantus 40 units daily and Metformin XR 500 mg daily ?Dc'd metformin 2/2 diarrhea.   ?CBGs well controlled on Tradjenta, Glucotrol and Semglee ? ? ? ?Peripheral neuropathy ?Patient reporting pain in the bottom of both feet but assessment limited due to lack of translation services equipment availability ?Given his longstanding history of diabetes I have started low-dose Neurontin 100 mg BID for suspected neuropathic pain ? ?Chronic Hepatitis B ?GI consulted-they suspected chronic Hep B with worsening of LFTs in the setting of ischemic injury during orthostasis vs. Drug induced  liver injury (risperdal) ?After DC needs referral to ID for management of chronic hep B ?AFP tumor marker slightly elevated at 10 c/w acute exacerbation and hepatitis process -CT abdomen without any solid liver masses ?GI recommends outpatient follow-up at [facility] hepatology clinic vs with ID here in [place].  ?-Although may be a candidate for hepatitis B treatment would need to be initiated in the outpatient setting with subsequent insurance authorization to avoid interruption in treatment.  Also has severe depression which may influence decision to initiate hepatitis B medical therapy. ? ? ?Orthostatic hypotension likely 2/2 autonomic dysfunction-resolved as of 06/01/2021 ?Has had episodic orthostasis while in ED during the past 2 months and received intermittent IV fluids ?TSH normal, cortisol checked on 04/14/2021 low at 3.1 but cosyntropin stim test on 2/6 baseline cortisol 11 which ruled out adrenal insufficiency ?-continue TED hose ?-Cont Midodrine ? ?MDD (major depressive disorder), recurrent, severe, with psychosis (HCC) ?Appreciate psychiatry assistance ?Stable on lexapro and cogentin ?Risperidone discontinued 2/2 elevated LFTs and Invega initiated by the psychiatric team ?Social isolation due to language barrier remains a significant problem and is likely influencing his depression ? ? ? ?Anemia ?Iron panel: Ferritin 696.  B12 in December 2021: 750 and folate 16.5. ?Hemoglobin stable in the 12 g range (3/11).  Outpatient follow-up ? ?Acute urinary retention-resolved as of 06/01/2021 ?Resolved ? ?Physical deconditioning ?Resolved.  Able to ambulate well.  Primarily limited by deconditioning preference to stay in the bed most days ? ?A physical therapy consult is indicated based on the patient?s mobility assessment. ? ? ?Mobility Assessment (last 72 hours)   ? ? Mobility Assessment   ? ? Row  Name 05/30/21 2000 05/29/21 1925 05/29/21 0800 05/28/21 1955  ?  ? Does patient have an order for bedrest or is patient  medically unstable No - Continue assessment No - Continue assessment No - Continue assessment No - Continue assessment   ? What is the highest level of mobility based on the progressive mobility assessment? Level 6 (Walks independently in room and hall) - Balance while walking in room without assist - Complete Level 6 (Walks independently in room and hall) - Balance while walking in room without assist - Complete Level 6 (Walks independently in room and hall) - Balance while walking in room without assist - Complete Level 6 (Walks independently in room and hall) - Balance while walking in room without assist - Complete   ? Is the above level different from baseline mobility prior to current illness? No - Consider discontinuing PT/OT No - Consider discontinuing PT/OT No - Consider discontinuing PT/OT No - Consider discontinuing PT/OT   ? ?  ?  ? ?  ? ? ? ?Low back pain ?Controlled on prn Robaxin and Tylenol ?Continue to encourage increased mobility especially outside of room to minimize back pain ?Patient complaining of radicular pain.  CT of pelvis and lumbar spine unremarkable and remained stable with no evidence of nerve root encroachment ?Continue to encourage patient to get out of bed and mobilize to minimize back pain. ? ?Thrombocytopenia (HCC)-resolved as of 06/01/2021 ?Platelets stable and > 100,000 ? ?Elevated LFTs ?See Hepatitis B notation ? ?Constipation ?Documented bowel movement on 3/1 ?Continue daily MiraLAX and Senokot ?Give one-time dose of milk of magnesia on 3/6 ? ? ? ?Subjective:  ?Alert and sitting in the bed.  He is complaining of bilateral foot pain and unclear if this was on the plantar surfaces.  Elucidation of all symptoms limited by lack of availability of translating service equipment. ? ?Physical Exam: ?Vitals:  ? 06/27/21 1401 06/27/21 2115 06/28/21 0553 06/28/21 0745  ?BP: 119/90 97/70 100/77 104/63  ?Pulse: 92 95 89 90  ?Resp: 18 18 18 18   ?Temp: 97.6 ?F (36.4 ?C) 98.2 ?F (36.8 ?C) 98.7  ?F (37.1 ?C) (!) 97.5 ?F (36.4 ?C)  ?TempSrc: Oral Oral Oral Oral  ?SpO2: 100% 99% 100% 100%  ?Weight:      ?Height:      ? ?General: Calm, NAD ?Respiratory: Stable on room air, anterior lung sounds clear to auscultation, respiratory effort and pattern normal ?Cardiovascular: Normotensive, S1 S2, regular pulse.  ?Gastrointestinal: Abdomen is nondistended, soft and nontender.  Positive bowel sounds noted.  LBM 3/26 ?Neurological: Cranial nerves II through XII are grossly intact, patient able to move all extremities x4 with strength 5/5.  Sensation is intact ?Psychiatry: Judgement and insight intact.  Mood & affect flat  ? ?Data Reviewed: ?There are no new results to review at this time. ? ? DVT Prophylaxis   ?., Place ted hose  ?Place ted hose  ? ? ?Family Communication:  ?No family available noting they have all returned to 4/26 ? ?Disposition: ?Remains inpatient appropriate because: Unsafe discharge plan-deconditioned with ambulation concerns primarily related to recurrent orthostasis-also uncontrolled diabetes at presentation; severe depression along with language barrier contributing to patient's ability for self-care-care be recommends short-term SNF for rehabilitation; also patient does not have a way to pay for SNF or rehab ?Please review any new documentation from CM/LCSW ? ? ? ? ?Planned Discharge Destination:  ?Family care home/Rest home or ALF ? ? ?COVID vaccination status:  ?Unknown ? ?Consultants: ?Psychiatry ?Gastroenterology ?Procedures: ?  Echocardiogram ?Antibiotics: ?None ? ? ? ?Time spent: 15 minutes ? ?Author: ?Junious SilkAllison Nannette Zill, NP ?06/28/2021 12:11 PM ? ?For on call review [email or web address].  ? ?

## 2021-06-28 NOTE — [place] FL2 (Signed)
?[place] MEDICAID FL2 LEVEL OF CARE SCREENING TOOL  ?  ? ?IDENTIFICATION  ?Patient Name: ?Garrett [name] Birthdate: [birth year] Sex: male Admission Date (Current Location): ?03/04/2021  ?[place] and [place] Number: ? [place] ?[id number] L Facility and Address:  ?[facility], [address], [place], [place] [address] ?     Provider Number: ?[id number]  ?Attending Physician Name and Address:  ?Joseph Art, DO ? Relative Name and Phone Number:  ?  ?   ?Current Level of Care: ?Hospital Recommended Level of Care: ?Assisted Living Facility, [facility] Prior Approval Number: ?  ? ?Date Approved/Denied: ?  PASRR Number: ?[id number] E ? ?Discharge Plan: ?Other (Comment) (ALF/Family Care Home) ?  ? ?Current Diagnoses: ?Patient Active Problem List  ? Diagnosis Date Noted  ? Generalized weakness   ? Low back pain 05/24/2021  ? Major depressive disorder, recurrent episode, moderate with mood-congruent psychotic features (HCC)   ? Chronic Hepatitis B 05/12/2021  ? Physical deconditioning   ? Anemia 05/05/2021  ? Altered mental status   ? Controlled type 2 diabetes mellitus without complication, with long-term current use of insulin (HCC) 03/09/2020  ? Acute metabolic encephalopathy 03/09/2020  ? Elevated LFTs 03/09/2020  ? Major depressive disorder, recurrent severe without psychotic features (HCC) 06/20/2017  ? MDD (major depressive disorder), recurrent, severe, with psychosis (HCC) 06/19/2017  ? ? ?Orientation RESPIRATION BLADDER Height & Weight   ?  ?Self, Time, Situation, Place ? Normal Continent Weight: 49.3 kg ?Height:  5\' 2"  (157.5 cm)  ?BEHAVIORAL SYMPTOMS/MOOD NEUROLOGICAL BOWEL NUTRITION STATUS  ?    Continent Diet (Diabetic diet - See discharge summary)  ?AMBULATORY STATUS COMMUNICATION OF NEEDS Skin   ?Independent Verbally (Speaks language and few words of English language) Normal ?  ?  ?  ?    ?     ?     ? ? ?Personal Care Assistance Level of Assistance  ?Bathing, Feeding, Dressing  Bathing Assistance: Independent ?Feeding assistance: Independent ?Dressing Assistance: Independent ?   ? ?Functional Limitations Info  ?Sight, Hearing, Speech Sight Info: Adequate ?Hearing Info: Adequate ?Speech Info: Adequate (Patient speaks fluent [name] Language and limited [name])  ? ? ?SPECIAL CARE FACTORS FREQUENCY  ?    ?  ?  ?  ?  ?  ?  ?   ? ? ?Contractures Contractures Info: Not present  ? ? ?Additional Factors Info  ?Code Status, Allergies, Psychotropic, Insulin Sliding Scale Code Status Info: Full code ?Allergies Info: NKDA ?Psychotropic Info: Invega, Ativan, Cogentin, Lexapro, Neurontin ?Insulin Sliding Scale Info: See Discharge Summary - currently Novolog SQ with sliding scale TID with meals ?  ?   ? ?Current Medications (06/28/2021):  This is the current hospital active medication list ?Current Facility-Administered Medications  ?Medication Dose Route Frequency Provider Last Rate Last Admin  ? 0.9 %  sodium chloride infusion  250 mL Intravenous PRN 06/30/2021, MD      ? acetaminophen (TYLENOL) tablet 650 mg  650 mg Oral Q6H PRN Orland Mustard, MD   650 mg at 06/19/21 0944  ? benztropine (COGENTIN) tablet 0.5 mg  0.5 mg Oral BID 06/21/21, MD   0.5 mg at 06/28/21 06/30/21  ? escitalopram (LEXAPRO) tablet 10 mg  10 mg Oral Daily Mesner, Jason, MD   10 mg at 06/27/21 0813  ? feeding supplement (GLUCERNA SHAKE) (GLUCERNA SHAKE) liquid 237 mL  237 mL Oral TID BM 06/29/21, NP   237 mL at 06/27/21 1502  ?  gabapentin (NEURONTIN) capsule 100 mg  100 mg Oral BID Russella Dar, NP      ? glipiZIDE (GLUCOTROL) tablet 5 mg  5 mg Oral QAC breakfast Dahal, Melina Schools, MD   5 mg at 06/28/21 3151  ? insulin aspart (novoLOG) injection 0-6 Units  0-6 Units Subcutaneous TID WC Russella Dar, NP   1 Units at 06/27/21 1835  ? insulin glargine-yfgn (SEMGLEE) injection 15 Units  15 Units Subcutaneous Daily Russella Dar, NP   15 Units at 06/27/21 0813  ? linagliptin (TRADJENTA) tablet 5 mg  5 mg Oral Daily  Russella Dar, NP   5 mg at 06/27/21 0813  ? LORazepam (ATIVAN) tablet 0.5 mg  0.5 mg Oral QHS Rankin, Shuvon B, NP   0.5 mg at 06/27/21 2153  ? methocarbamol (ROBAXIN) tablet 500 mg  500 mg Oral Q8H PRN Russella Dar, NP   500 mg at 06/25/21 2121  ? midodrine (PROAMATINE) tablet 10 mg  10 mg Oral TID WC Elease Etienne, MD   10 mg at 06/28/21 7616  ? paliperidone (INVEGA) 24 hr tablet 3 mg  3 mg Oral QHS Karsten Ro, MD   3 mg at 06/27/21 2153  ? polyethylene glycol (MIRALAX / GLYCOLAX) packet 17 g  17 g Oral Daily Elease Etienne, MD   17 g at 06/27/21 0813  ? selenium sulfide (SELSUN) 1 % shampoo   Topical Daily Russella Dar, NP   Given at 06/26/21 1648  ? senna-docusate (Senokot-S) tablet 1 tablet  1 tablet Oral QHS Elease Etienne, MD   1 tablet at 06/27/21 2153  ? sodium chloride flush (NS) 0.9 % injection 3 mL  3 mL Intravenous Q12H Orland Mustard, MD   3 mL at 06/27/21 0816  ? sodium chloride flush (NS) 0.9 % injection 3 mL  3 mL Intravenous PRN Orland Mustard, MD      ? thiamine tablet 100 mg  100 mg Oral Daily Mesner, Barbara Cower, MD   100 mg at 06/27/21 0813  ? ? ? ?Discharge Medications: ?Please see discharge summary for a list of discharge medications. ? ?Relevant Imaging Results: ? ?Relevant Lab Results: ? ? ?Additional Information ?SSN# [id number] ? ?Janae Bridgeman, RN ? ? ? ? ?

## 2021-06-28 NOTE — TOC Progression Note (Addendum)
Transition of Care (TOC) - Progression Note  ? ? ?Patient Details  ?Name: Garrett [name] ?MRN: [id number] ?Date of Birth: Apr 18, 1985 ? ?Transition of Care (TOC) CM/SW Contact  ?Curlene Labrum, RN ?Phone Number: ?06/28/2021, 9:16 AM ? ?Clinical Narrative:    ?CM called and spoke with Grier Rocher, Admissions director at [facility] and the facility has male bed availability.  FL2 updated and clinicals sent to [facility] to [facility] [email or web address] to review. ? ?CM and MSW with DTP Team will continue to follow the patient for [facility]/ALF placement. ? ?06/28/2021 1417 - CM spoke with Anitha with [facility] and she plans to visit with the patient this afternoon at the bedside.  The 6N secretary and primary nurse are aware of the visit this afternoon. ? ? ?Expected Discharge Plan: Assisted Living ?Barriers to Discharge: Family Issues, Unsafe home situation ? ?Expected Discharge Plan and Services ?Expected Discharge Plan: Assisted Living ?In-house Referral: Development worker, community ([facility] pending) ?Discharge Planning Services: CM Consult ?  ?Living arrangements for the past 2 months: [place] ?                ?  ?  ?  ?  ?  ?  ?  ?  ?  ?  ? ? ?Social Determinants of Health (SDOH) Interventions ?  ? ?Readmission Risk Interventions ?   ? View : No data to display.  ?  ?  ?  ? ? ?

## 2021-06-28 NOTE — Assessment & Plan Note (Signed)
Patient reporting pain in the bottom of both feet but assessment limited due to lack of translation services equipment availability ?Given his longstanding history of diabetes I have started low-dose Neurontin 100 mg BID for suspected neuropathic pain ?

## 2021-06-28 NOTE — Plan of Care (Signed)

## 2021-06-29 DIAGNOSIS — K5900 Constipation, unspecified: Secondary | ICD-10-CM

## 2021-06-29 DIAGNOSIS — K5901 Slow transit constipation: Secondary | ICD-10-CM

## 2021-06-29 DIAGNOSIS — Z794 Long term (current) use of insulin: Secondary | ICD-10-CM | POA: Diagnosis not present

## 2021-06-29 DIAGNOSIS — E119 Type 2 diabetes mellitus without complications: Secondary | ICD-10-CM | POA: Diagnosis not present

## 2021-06-29 DIAGNOSIS — G629 Polyneuropathy, unspecified: Secondary | ICD-10-CM | POA: Diagnosis not present

## 2021-06-29 LAB — GLUCOSE, CAPILLARY
Glucose-Capillary: 149 mg/dL — ABNORMAL HIGH (ref 70–99)
Glucose-Capillary: 114 mg/dL — ABNORMAL HIGH (ref 70–99)
Glucose-Capillary: 98 mg/dL (ref 70–99)
Glucose-Capillary: 75 mg/dL (ref 70–99)

## 2021-06-29 MED ORDER — SENNOSIDES-DOCUSATE SODIUM 8.6-50 MG PO TABS
1.0000 | ORAL_TABLET | Freq: Every evening | ORAL | Status: DC | PRN
Start: 2021-06-29 — End: 2021-07-12
  Filled 2021-06-29: qty 1

## 2021-06-29 MED ORDER — SACCHAROMYCES BOULARDII 250 MG PO CAPS
250.0000 mg | ORAL_CAPSULE | Freq: Two times a day (BID) | ORAL | Status: DC
  Administered 2021-06-29 – 2021-07-12 (×27): 250 mg via ORAL
  Filled 2021-06-29 (×27): qty 1

## 2021-06-29 MED ORDER — POLYETHYLENE GLYCOL 3350 17 G PO PACK: 17.0000 g | PACK | Freq: Every day | ORAL | Status: DC | PRN

## 2021-06-29 NOTE — Plan of Care (Signed)
  Problem: Education: Goal: Knowledge of General Education information will improve Description Including pain rating scale, medication(s)/side effects and non-pharmacologic comfort measures Outcome: Progressing   Problem: Health Behavior/Discharge Planning: Goal: Ability to manage health-related needs will improve Outcome: Progressing   

## 2021-06-29 NOTE — Progress Notes (Signed)
Mobility Specialist Progress Note: ? ? 06/29/21 1446  ?Mobility  ?Activity Ambulated with assistance in hallway  ?Level of Assistance Independent  ?Assistive Device None  ?Distance Ambulated (ft) 1120 ft  ?Activity Response Tolerated well  ?$Mobility charge 1 Mobility  ? ?Second walk of the Garrett [name]. Complaints of "a little" foot pain. Left in bed with call bell in reach and all needs met. ? ?Garrett [name] ?Mobility Specialist ?Primary Phone [phone] ? ?

## 2021-06-29 NOTE — Progress Notes (Signed)
?Progress Note ? ? ?PatientMong Neal [name] DOB: 08-18-1985 DOA: 03/04/2021     55 ?DOS: the patient was seen and examined on 06/29/2021 ?Needs interpreter/speaks [name] ?  ?Brief hospital course: ?Garrett [name] is a 36 y.o. male with medical history significant of severe depression with psychosis, T2DM and orthostatic hypotension who had been holding in ED for about 2 months time waiting on placement. He was being cared for by members in the community, but they have returned home and he no longer has care. He has been boarding in ED with some hypoglycemic episodes, diarrhea (resolved after metformin adjusted) and some orthostatic hypotension that continues to persist despite stopping his antihypertensive medication as well as addition of SSRI/mood stabilizing drugs.  While in the ER he also had an isolated episode of hyperglycemia with serum glucose 503 with a normal anion gap.  Not consistent with DKA.  Labs were checked and he was noted to have significantly elevated LFTs and with concomitant orthostasis he was referred for admission.  Currently, social work continues to work on placement.  He continues to have significant orthostasis that is limiting his ability to ambulate. ?  ? ? ?Assessment and Plan: ?Controlled type 2 diabetes mellitus without complication, with long-term current use of insulin (HCC) ?Hga1c of 5.9 04/2021 ?HOME MEDS: Lantus 40 units daily and Metformin XR 500 mg daily ?Dc'd metformin 2/2 diarrhea.   ?CBGs well controlled on Tradjenta, Glucotrol and Semglee ? ? ? ?Peripheral neuropathy ?Patient reporting pain in the bottom of both feet but assessment limited due to lack of translation services equipment availability ?Given his longstanding history of diabetes I have started low-dose Neurontin 100 mg BID for suspected neuropathic pain ? ?Chronic Hepatitis B ?GI consulted-they suspected chronic Hep B with worsening of LFTs in the setting of ischemic injury during orthostasis vs. Drug induced  liver injury (risperdal) ?After DC needs referral to ID for management of chronic hep B ?AFP tumor marker slightly elevated at 10 c/w acute exacerbation and hepatitis process -CT abdomen without any solid liver masses ?GI recommends outpatient follow-up at [facility] hepatology clinic vs with ID here in [place].  ?-Although may be a candidate for hepatitis B treatment would need to be initiated in the outpatient setting with subsequent insurance authorization to avoid interruption in treatment.  Also has severe depression which may influence decision to initiate hepatitis B medical therapy. ? ? ?Constipation ?Patient had had issues with bowel motility early in the hospitalization ?Today when asked if he had anything else to discuss he reported that he has been having intermittent issues with nonpainful watery or loose stools that have not had blood in them ?In review of the chart he has been on scheduled MiraLAX and Senokot which have been changed to as needed ?We will add Florastor ? ?Orthostatic hypotension likely 2/2 autonomic dysfunction-resolved as of 06/01/2021 ?Has had episodic orthostasis while in ED during the past 2 months and received intermittent IV fluids ?TSH normal, cortisol checked on 04/14/2021 low at 3.1 but cosyntropin stim test on 2/6 baseline cortisol 11 which ruled out adrenal insufficiency ?-continue TED hose ?-Cont Midodrine ? ?MDD (major depressive disorder), recurrent, severe, with psychosis (HCC) ?Appreciate psychiatry assistance ?Stable on lexapro and cogentin ?Risperidone discontinued 2/2 elevated LFTs and Invega initiated by the psychiatric team ?Social isolation due to language barrier remains a significant problem and is likely influencing his depression ? ? ? ?Anemia ?Iron panel: Ferritin 696.  B12 in December 2021: 750 and folate 16.5. ?Hemoglobin stable in  the 12 g range (3/11).  Outpatient follow-up ? ?Acute urinary retention-resolved as of 06/01/2021 ?Resolved ? ?Physical  deconditioning ?Resolved.  Able to ambulate well.  Primarily limited by deconditioning preference to stay in the bed most days ? ?A physical therapy consult is indicated based on the patient?s mobility assessment. ? ? ?Mobility Assessment (last 72 hours)   ? ? Mobility Assessment   ? ? Row Name 05/30/21 2000 05/29/21 1925 05/29/21 0800 05/28/21 1955  ?  ? Does patient have an order for bedrest or is patient medically unstable No - Continue assessment No - Continue assessment No - Continue assessment No - Continue assessment   ? What is the highest level of mobility based on the progressive mobility assessment? Level 6 (Walks independently in room and hall) - Balance while walking in room without assist - Complete Level 6 (Walks independently in room and hall) - Balance while walking in room without assist - Complete Level 6 (Walks independently in room and hall) - Balance while walking in room without assist - Complete Level 6 (Walks independently in room and hall) - Balance while walking in room without assist - Complete   ? Is the above level different from baseline mobility prior to current illness? No - Consider discontinuing PT/OT No - Consider discontinuing PT/OT No - Consider discontinuing PT/OT No - Consider discontinuing PT/OT   ? ?  ?  ? ?  ? ? ? ?Low back pain ?Controlled on prn Robaxin and Tylenol ?Continue to encourage increased mobility especially outside of room to minimize back pain ?Patient complaining of radicular pain.  CT of pelvis and lumbar spine unremarkable and remained stable with no evidence of nerve root encroachment ?Continue to encourage patient to get out of bed and mobilize to minimize back pain. ? ?Thrombocytopenia (HCC)-resolved as of 06/01/2021 ?Platelets stable and > 100,000 ? ?Elevated LFTs ?See Hepatitis B notation ? ? ? ? ? ?Subjective:  ?Reporting back and foot pain improved.  Complaining of intermittent watery and loose stools without abdominal pain or blood ? ?Physical  Exam: ?Vitals:  ? 06/28/21 1549 06/28/21 2003 06/29/21 0400 06/29/21 0715  ?BP: (!) 129/91 123/89 102/81 113/85  ?Pulse: 89 91 92 90  ?Resp: 18 17 18 16   ?Temp: 98 ?F (36.7 ?C) 98.4 ?F (36.9 ?C) 97.6 ?F (36.4 ?C) (!) 97.3 ?F (36.3 ?C)  ?TempSrc: Oral Oral Oral Oral  ?SpO2: 98% 98% 99% 97%  ?Weight:      ?Height:      ? ?General: Calm, NAD ?Respiratory: Stable on room air, anterior lung sounds clear to auscultation, respiratory effort and pattern normal ?Cardiovascular: Normotensive, S1 S2, regular pulse.  ?Gastrointestinal: Abdomen is nondistended, soft and nontender.  Positive bowel sounds noted.  LBM 3/26 ?Neurological: Cranial nerves II through XII are grossly intact, patient able to move all extremities x4 with strength 5/5.  Sensation is intact ?Psychiatry: Judgement and insight intact.  Mood & affect flat  ? ?Data Reviewed: ?There are no new results to review at this time. ? ? DVT Prophylaxis   ?., Place ted hose  ?Place ted hose  ? ? ?Family Communication:  ?No family available noting they have all returned to 4/26 ? ?Disposition: ?Remains inpatient appropriate because: Unsafe discharge plan-deconditioned with ambulation concerns primarily related to recurrent orthostasis-also uncontrolled diabetes at presentation; severe depression along with language barrier contributing to patient's ability for self-care-care be recommends short-term SNF for rehabilitation; also patient does not have a way to pay for SNF or rehab ?  Please review any new documentation from CM/LCSW ? ? ?Planned Discharge Destination:  ?Family care home/Rest home or ALF ? ? ?COVID vaccination status:  ?Unknown ? ?Consultants: ?Psychiatry ?Gastroenterology ?Procedures: ?Echocardiogram ?Antibiotics: ?None ? ? ? ?Time spent: 15 minutes ? ?Author: ?Junious SilkAllison Nathaniel Wakeley, NP ?06/29/2021 2:24 PM ? ?For on call review [email or web address].  ? ?

## 2021-06-29 NOTE — TOC Progression Note (Signed)
Transition of Care (TOC) - Progression Note  ? ? ?Patient Details  ?Name: Garrett [name] ?MRN: [id number] ?Date of Birth: 1985-12-16 ? ?Transition of Care (TOC) CM/SW Contact  ?Janae Bridgeman, RN ?Phone Number: ?06/29/2021, 1:56 PM ? ?Clinical Narrative:    ?CM called and spoke with Anitha, CM with [facility] facility and she has offered placement to the patient.  I called and spoke with Grandville Silos and he will follow up with the facility to contract for placement.  Facesheet with Medicaid was resent to Anitha at [facility].  The patient's disability is pending with Helmut Muster with [facility]. ? ?CM and MSW with DTP Team will continue to follow the patient for placement at ALF/Group Home. ? ? ?Expected Discharge Plan: Assisted Living ?Barriers to Discharge: Family Issues, Unsafe home situation ? ?Expected Discharge Plan and Services ?Expected Discharge Plan: Assisted Living ?In-house Referral: Artist ([facility] disability pending) ?Discharge Planning Services: CM Consult ?  ?Living arrangements for the past 2 months: Single Family Home ?                ?  ?  ?  ?  ?  ?  ?  ?  ?  ?  ? ? ?Social Determinants of Health (SDOH) Interventions ?  ? ?Readmission Risk Interventions ?   ? View : No data to display.  ?  ?  ?  ? ? ?

## 2021-06-29 NOTE — Assessment & Plan Note (Signed)
Patient had had issues with bowel motility early in the hospitalization ?Today when asked if he had anything else to discuss he reported that he has been having intermittent issues with nonpainful watery or loose stools that have not had blood in them ?In review of the chart he has been on scheduled MiraLAX and Senokot which have been changed to as needed ?We will add Florastor ?

## 2021-06-29 NOTE — Progress Notes (Signed)
Mobility Specialist Progress Note: ? ? 06/29/21 0942  ?Mobility  ?Activity Ambulated with assistance in hallway  ?Level of Assistance Independent  ?Assistive Device None  ?Distance Ambulated (ft) 1120 ft  ?Activity Response Tolerated well  ?$Mobility charge 1 Mobility  ? ?Pt received in bed willing to participate in mobility. Complaints of "a little" foot and back pain. Left in bed with call bell in reach and all needs met.  ? ?Garrett [name] ?Mobility Specialist ?Primary Phone [phone] ? ?

## 2021-06-30 DIAGNOSIS — Z794 Long term (current) use of insulin: Secondary | ICD-10-CM | POA: Diagnosis not present

## 2021-06-30 DIAGNOSIS — E119 Type 2 diabetes mellitus without complications: Secondary | ICD-10-CM | POA: Diagnosis not present

## 2021-06-30 DIAGNOSIS — G629 Polyneuropathy, unspecified: Secondary | ICD-10-CM | POA: Diagnosis not present

## 2021-06-30 LAB — GLUCOSE, CAPILLARY
Glucose-Capillary: 94 mg/dL (ref 70–99)
Glucose-Capillary: 123 mg/dL — ABNORMAL HIGH (ref 70–99)
Glucose-Capillary: 101 mg/dL — ABNORMAL HIGH (ref 70–99)
Glucose-Capillary: 106 mg/dL — ABNORMAL HIGH (ref 70–99)

## 2021-06-30 NOTE — Progress Notes (Signed)
Mobility Specialist: Progress Note ? ? 06/30/21 1610  ?Mobility  ?Activity Ambulated independently in hallway  ?Level of Assistance Independent  ?Assistive Device None  ?Distance Ambulated (ft) 570 ft  ?Activity Response Tolerated well  ?$Mobility charge 1 Mobility  ? ?Received pt in bed having no complaints and agreeable to mobility. Asymptomatic throughout ambulation, returned back to bed w/ call bell in reach and all needs met. ? ?Garrett [name] ?Mobility Specialist ?Mobility Specialist [place]: [phone] ?Mobility Specialist [place]: [phone] ? ?

## 2021-06-30 NOTE — TOC Progression Note (Signed)
Transition of Care (TOC) - Progression Note  ? ? ?Patient Details  ?Name: Garrett [name] ?MRN: [id number] ?Date of Birth: 28-Jul-1985 ? ?Transition of Care (TOC) CM/SW Contact  ?Janae Bridgeman, RN ?Phone Number: ?06/30/2021, 10:44 AM ? ?Clinical Narrative:    ?CM called and spoke with Malvin Johns, CM/owner of [facility] and the facility has offered an available bed to the patient - pending discharge date.  I spoke with Junious Silk, NP and she is aware of patient's pending discharge to the facility and ordered placed to have blood-work drawn to R/O TB.  [facility] will provide for the patient's medications and Glucometer needs at the facility.  No dme is needed at this time. ? ?I called [employer] and spoke with representative and the patient has full [place] Medicaid and is eligible for [facility] once assessment and referral is complete.  I called the expedited number and sent signed DHB-3051 form to Expedited fax # to [phone]. ? ?I called and spoke with Voncille Lo, director of [facility] and asked that she forward the patient's disability check to Malvin Johns, Production designer, theatre/television/film of [facility] to mailing address [facility], [address] [place], [place] [address]. ? ?CM called and spoke with Malvin Johns, manager and current MAR, face-sheet, Copy of patient's ID, social security card were all emailed to her by secure email to [email or web address]. ? ?CM and MSW with DTP Team will continue to follow the patient for discharge needs. ? ? ?Expected Discharge Plan: Assisted Living ?Barriers to Discharge: Family Issues, Unsafe home situation ? ?Expected Discharge Plan and Services ?Expected Discharge Plan: Assisted Living ?In-house Referral: Artist ([facility] disability pending) ?Discharge Planning Services: CM Consult ?  ?Living arrangements for the past 2 months: Single Family Home ?                ?  ?  ?  ?  ?  ?  ?  ?  ?  ?  ? ? ?Social  Determinants of Health (SDOH) Interventions ?  ? ?Readmission Risk Interventions ?   ? View : No data to display.  ?  ?  ?  ? ? ?

## 2021-06-30 NOTE — Progress Notes (Signed)
?Progress Note ? ? ?PatientKemonta [name] [id number] DOB: 1985-12-25 DOA: 03/04/2021     56 ?DOS: the patient was seen and examined on 06/30/2021 ?Needs interpreter/speaks [name] ?  ?Brief hospital course: ?Garrett [name] is a 36 y.o. male with medical history significant of severe depression with psychosis, T2DM and orthostatic hypotension who had been holding in ED for about 2 months time waiting on placement. He was being cared for by members in the community, but they have returned home and he no longer has care. He has been boarding in ED with some hypoglycemic episodes, diarrhea (resolved after metformin adjusted) and some orthostatic hypotension that continues to persist despite stopping his antihypertensive medication as well as addition of SSRI/mood stabilizing drugs.  While in the ER he also had an isolated episode of hyperglycemia with serum glucose 503 with a normal anion gap.  Not consistent with DKA.  Labs were checked and he was noted to have significantly elevated LFTs and with concomitant orthostasis he was referred for admission.  Currently, social work continues to work on placement.  He continues to have significant orthostasis that is limiting his ability to ambulate. ?  ? ? ?Assessment and Plan: ?Controlled type 2 diabetes mellitus without complication, with long-term current use of insulin ([place]) ?Hga1c of 5.9 04/2021 ?HOME MEDS: Lantus 40 units daily and Metformin XR 500 mg daily ?Dc'd metformin 2/2 diarrhea.   ?CBGs well controlled on Tradjenta, Glucotrol and Semglee ? ? ? ?Peripheral neuropathy ?Patient reporting pain in the bottom of both feet but assessment limited due to lack of translation services equipment availability ?Given his longstanding history of diabetes I have started low-dose Neurontin 100 mg BID for suspected neuropathic pain ? ?Chronic Hepatitis B ?GI consulted-they suspected chronic Hep B with worsening of LFTs in the setting of ischemic injury during orthostasis vs. Drug induced  liver injury (risperdal) ?After DC needs referral to ID for management of chronic hep B ?AFP tumor marker slightly elevated at 10 c/w acute exacerbation and hepatitis process -CT abdomen without any solid liver masses ?GI recommends outpatient follow-up at [facility] hepatology clinic vs with ID here in [place].  ?-Although may be a candidate for hepatitis B treatment would need to be initiated in the outpatient setting with subsequent insurance authorization to avoid interruption in treatment.  Also has severe depression which may influence decision to initiate hepatitis B medical therapy. ? ? ?Constipation ?Patient had had issues with bowel motility early in the hospitalization ?Today when asked if he had anything else to discuss he reported that he has been having intermittent issues with nonpainful watery or loose stools that have not had blood in them ?In review of the chart he has been on scheduled MiraLAX and Senokot which have been changed to as needed ?We will add Florastor ? ?Orthostatic hypotension likely 2/2 autonomic dysfunction-resolved as of 06/01/2021 ?Has had episodic orthostasis while in ED during the past 2 months and received intermittent IV fluids ?TSH normal, cortisol checked on 04/14/2021 low at 3.1 but cosyntropin stim test on 2/6 baseline cortisol 11 which ruled out adrenal insufficiency ?-continue TED hose ?-Cont Midodrine ? ?MDD (major depressive disorder), recurrent, severe, with psychosis ([place]) ?Appreciate psychiatry assistance ?Stable on lexapro and cogentin ?Risperidone discontinued 2/2 elevated LFTs and Invega initiated by the psychiatric team ?Social isolation due to language barrier remains a significant problem and is likely influencing his depression ? ? ? ?Anemia ?Iron panel: Ferritin 696.  B12 in December 2021: 750 and folate 16.5. ?Hemoglobin stable in  the 12 g range (3/11).  Outpatient follow-up ? ?Acute urinary retention-resolved as of 06/01/2021 ?Resolved ? ?Physical  deconditioning ?Resolved.  Able to ambulate well.  Primarily limited by deconditioning preference to stay in the bed most days ? ?A physical therapy consult is indicated based on the patient?s mobility assessment. ? ? ?Mobility Assessment (last 72 hours)   ? ? Mobility Assessment   ? ? [place] Name 05/30/21 2000 05/29/21 1925 05/29/21 0800 05/28/21 1955  ?  ? Does patient have an order for bedrest or is patient medically unstable No - Continue assessment No - Continue assessment No - Continue assessment No - Continue assessment   ? What is the highest level of mobility based on the progressive mobility assessment? Level 6 (Walks independently in room and hall) - Balance while walking in room without assist - Complete Level 6 (Walks independently in room and hall) - Balance while walking in room without assist - Complete Level 6 (Walks independently in room and hall) - Balance while walking in room without assist - Complete Level 6 (Walks independently in room and hall) - Balance while walking in room without assist - Complete   ? Is the above level different from baseline mobility prior to current illness? No - Consider discontinuing PT/OT No - Consider discontinuing PT/OT No - Consider discontinuing PT/OT No - Consider discontinuing PT/OT   ? ?  ?  ? ?  ? ? ? ?Low back pain ?Controlled on prn Robaxin and Tylenol ?Continue to encourage increased mobility especially outside of room to minimize back pain ?Patient complaining of radicular pain.  CT of pelvis and lumbar spine unremarkable and remained stable with no evidence of nerve root encroachment ?Continue to encourage patient to get out of bed and mobilize to minimize back pain. ? ?Thrombocytopenia (HCC)-resolved as of 06/01/2021 ?Platelets stable and > 100,000 ? ?Elevated LFTs ?See Hepatitis B notation ? ? ? ? ? ?Subjective:  ?Continues to report improvement in back and foot pain. ? ?Physical Exam: ?Vitals:  ? 06/29/21 1540 06/29/21 1956 06/30/21 0451 06/30/21 0824   ?BP: 119/85 119/86 98/70 105/72  ?Pulse: 92 92 89 91  ?Resp: 17 19 20 17   ?Temp: 97.7 ?F (36.5 ?C) 97.8 ?F (36.6 ?C) 97.9 ?F (36.6 ?C) 98 ?F (36.7 ?C)  ?TempSrc: Oral Oral Oral   ?SpO2: 100% 99% 98% 99%  ?Weight:      ?Height:      ? ?General: Calm, NAD ?Respiratory: Stable on room air, anterior lung sounds clear to auscultation, respiratory effort and pattern normal ?Cardiovascular: Normotensive, S1 S2, regular pulse.  ?Gastrointestinal: Abdomen is nondistended, soft and nontender.  Positive bowel sounds noted.  LBM 3/29 ?Neurological: Cranial nerves II through XII are grossly intact, patient able to move all extremities x4 with strength 5/5.  Sensation is intact ?Psychiatry: Judgement and insight intact.  Mood & affect flat  ? ?Data Reviewed: ?There are no new results to review at this time. ? ? DVT Prophylaxis   ?., Place ted hose  ?Place ted hose  ? ? ?Family Communication:  ?No family available noting they have all returned to [place] ? ?Disposition: ?Remains inpatient appropriate because: Unsafe discharge plan-deconditioned with ambulation concerns primarily related to recurrent orthostasis-also uncontrolled diabetes at presentation; severe depression along with language barrier contributing to patient's ability for self-care-care be recommends short-term SNF for rehabilitation; also patient does not have a way to pay for SNF or rehab ?Please review any new documentation from CM/LCSW ? ? ?Planned Discharge Destination:  ?  Family care home/Rest home or ALF ? ? ?COVID vaccination status:  ?Unknown ? ?Consultants: ?Psychiatry ?Gastroenterology ?Procedures: ?Echocardiogram ?Antibiotics: ?None ? ? ? ?Time spent: 15 minutes ? ?Author: ?Erin Hearing, NP ?06/30/2021 8:30 AM ? ?For on call review [email or web address].  ? ?

## 2021-07-01 DIAGNOSIS — Z794 Long term (current) use of insulin: Secondary | ICD-10-CM | POA: Diagnosis not present

## 2021-07-01 DIAGNOSIS — G6289 Other specified polyneuropathies: Secondary | ICD-10-CM

## 2021-07-01 DIAGNOSIS — E119 Type 2 diabetes mellitus without complications: Secondary | ICD-10-CM | POA: Diagnosis not present

## 2021-07-01 LAB — GLUCOSE, CAPILLARY
Glucose-Capillary: 135 mg/dL — ABNORMAL HIGH (ref 70–99)
Glucose-Capillary: 114 mg/dL — ABNORMAL HIGH (ref 70–99)
Glucose-Capillary: 125 mg/dL — ABNORMAL HIGH (ref 70–99)
Glucose-Capillary: 87 mg/dL (ref 70–99)

## 2021-07-01 LAB — COMPREHENSIVE METABOLIC PANEL
ALT: 56 U/L — ABNORMAL HIGH (ref 0–44)
AST: 33 U/L (ref 15–41)
Albumin: 3.5 g/dL (ref 3.5–5.0)
Alkaline Phosphatase: 51 U/L (ref 38–126)
Anion gap: 7 (ref 5–15)
BUN: 17 mg/dL (ref 6–20)
CO2: 29 mmol/L (ref 22–32)
Calcium: 9.1 mg/dL (ref 8.9–10.3)
Chloride: 102 mmol/L (ref 98–111)
Creatinine, Ser: 0.88 mg/dL (ref 0.61–1.24)
GFR, Estimated: >60 mL/min (ref >60)
Glucose, Bld: 124 mg/dL — ABNORMAL HIGH (ref 70–99)
Potassium: 3.7 mmol/L (ref 3.5–5.1)
Sodium: 138 mmol/L (ref 135–145)
Total Bilirubin: 0.6 mg/dL (ref 0.3–1.2)
Total Protein: 7.7 g/dL (ref 6.5–8.1)

## 2021-07-01 LAB — CBC WITH DIFFERENTIAL/PLATELET
Abs Immature Granulocytes: 0.02 10*3/uL (ref 0.00–0.07)
Basophils Absolute: 0 10*3/uL (ref 0.0–0.1)
Basophils Relative: 1 %
Eosinophils Absolute: 0.3 10*3/uL (ref 0.0–0.5)
Eosinophils Relative: 4 %
HCT: 36.5 % — ABNORMAL LOW (ref 39.0–52.0)
Hemoglobin: 12.3 g/dL — ABNORMAL LOW (ref 13.0–17.0)
Immature Granulocytes: 0 %
Lymphocytes Relative: 44 %
Lymphs Abs: 3.4 10*3/uL (ref 0.7–4.0)
MCH: 31.1 pg (ref 26.0–34.0)
MCHC: 33.7 g/dL (ref 30.0–36.0)
MCV: 92.2 fL (ref 80.0–100.0)
Monocytes Absolute: 0.7 10*3/uL (ref 0.1–1.0)
Monocytes Relative: 9 %
Neutro Abs: 3.2 10*3/uL (ref 1.7–7.7)
Neutrophils Relative %: 42 %
Platelets: 165 10*3/uL (ref 150–400)
RBC: 3.96 MIL/uL — ABNORMAL LOW (ref 4.22–5.81)
RDW: 13.2 % (ref 11.5–15.5)
WBC: 7.7 10*3/uL (ref 4.0–10.5)
nRBC: 0 % (ref 0.0–0.2)

## 2021-07-01 NOTE — Progress Notes (Signed)
Mobility Specialist Progress Note: ? ? 07/01/21 1001  ?Mobility  ?Activity Ambulated with assistance in hallway  ?Level of Assistance Independent  ?Assistive Device None  ?Distance Ambulated (ft) 1120 ft  ?Activity Response Tolerated well  ?$Mobility charge 1 Mobility  ? ?Pt received in bed willing to participate in mobility. No complaints of pain. Left in bed with call bell in reach and all needs met.  ? ?Garrett [name] ?Mobility Specialist ?Primary Phone [phone] ? ?

## 2021-07-01 NOTE — Progress Notes (Signed)
?Progress Note ?  ?  ?PatientJakarri [name] HFW:[id number] DOB: 1985/11/26 DOA: 03/04/2021     56 ?DOS: the patient was seen and examined on 06/30/2021 ?Needs interpreter/speaks [name] ?  ?Brief hospital course: ?Garrett [name] is a 36 y.o. male with medical history significant of severe depression with psychosis, T2DM and orthostatic hypotension who had been holding in ED for about 2 months time waiting on placement. He was being cared for by members in the community, but they have returned home and he no longer has care. He has been boarding in ED with some hypoglycemic episodes, diarrhea (resolved after metformin adjusted) and some orthostatic hypotension that continues to persist despite stopping his antihypertensive medication as well as addition of SSRI/mood stabilizing drugs.  While in the ER he also had an isolated episode of hyperglycemia with serum glucose 503 with a normal anion gap.  Not consistent with DKA.  Labs were checked and he was noted to have significantly elevated LFTs and with concomitant orthostasis he was referred for admission.  Currently, social work continues to work on placement.  He continues to have significant orthostasis that is limiting his ability to ambulate. ?  ?  ?  ?Assessment and Plan: ?Controlled type 2 diabetes mellitus without complication, with long-term current use of insulin (HCC) ?Hga1c of 5.9 04/2021 ?HOME MEDS: Lantus 40 units daily and Metformin XR 500 mg daily ?Dc'd metformin 2/2 diarrhea.   ?CBGs well controlled on Tradjenta, Glucotrol and Semglee ?  ?  ?  ?Peripheral neuropathy ?Patient reporting pain in the bottom of both feet but assessment limited due to lack of translation services equipment availability ?Given his longstanding history of diabetes I started low-dose Neurontin 100 mg BID for suspected neuropathic pain ?  ?Chronic Hepatitis B ?GI consulted-they suspected chronic Hep B with worsening of LFTs in the setting of ischemic injury during orthostasis vs. Drug  induced liver injury (risperdal) ?After DC needs referral to ID for management of chronic hep B ?AFP tumor marker slightly elevated at 10 c/w acute exacerbation and hepatitis process -CT abdomen without any solid liver masses ?GI recommends outpatient follow-up at [facility] hepatology clinic vs with ID here in [place].  ?-Although may be a candidate for hepatitis B treatment would need to be initiated in the outpatient setting with subsequent insurance authorization to avoid interruption in treatment.  Also has severe depression which may influence decision to initiate hepatitis B medical therapy. ?  ?  ?Constipation (resolved) ?Patient had had issues with bowel motility early in the hospitalization ?Today when asked if he had anything else to discuss he reported that he has been having intermittent issues with nonpainful watery or loose stools that have not had blood in them ?In review of the chart he has been on scheduled MiraLAX and Senokot which have been changed to as needed ?We will add Florastor ?  ?Orthostatic hypotension likely 2/2 autonomic dysfunction-resolved as of 06/01/2021 ?Has had episodic orthostasis while in ED during the past 2 months and received intermittent IV fluids ?TSH normal, cortisol checked on 04/14/2021 low at 3.1 but cosyntropin stim test on 2/6 baseline cortisol 11 which ruled out adrenal insufficiency ?-continue TED hose ?-Cont Midodrine ?  ?MDD (major depressive disorder), recurrent, severe, with psychosis (HCC) ?Appreciate psychiatry assistance ?Stable on lexapro and cogentin ?Risperidone discontinued 2/2 elevated LFTs and Invega initiated by the psychiatric team ?Social isolation due to language barrier remains a significant problem and is likely influencing his depression ?  ?Anemia ?Iron panel: Ferritin 696.  B12 in December 2021: 750 and folate 16.5. ?Hemoglobin stable in the 12 g range (3/11).  Outpatient follow-up ?  ?Acute urinary retention-resolved as of 06/01/2021 ?Resolved ?   ?Physical deconditioning ?Resolved.  Able to ambulate well.  Primarily limited by deconditioning preference to stay in the bed most days ?A physical therapy consult is indicated based on the patient?s mobility assessment. ?  ?  ?Mobility Assessment (last 72 hours)   ?  ?  Mobility Assessment   ?  ?  [place] Name 05/30/21 2000 05/29/21 1925 05/29/21 0800 05/28/21 1955   ?   ?  Does patient have an order for bedrest or is patient medically unstable No - Continue assessment No - Continue assessment No - Continue assessment No - Continue assessment    ?  What is the highest level of mobility based on the progressive mobility assessment? Level 6 (Walks independently in room and hall) - Balance while walking in room without assist - Complete Level 6 (Walks independently in room and hall) - Balance while walking in room without assist - Complete Level 6 (Walks independently in room and hall) - Balance while walking in room without assist - Complete Level 6 (Walks independently in room and hall) - Balance while walking in room without assist - Complete    ?  Is the above level different from baseline mobility prior to current illness? No - Consider discontinuing PT/OT No - Consider discontinuing PT/OT No - Consider discontinuing PT/OT No - Consider discontinuing PT/OT    ?  ?   ?  ?  ?   ?  ?  ?  ?Low back pain ?Controlled on prn Robaxin and Tylenol ?Continue to encourage increased mobility especially outside of room to minimize back pain ?Patient complaining of radicular pain.  CT of pelvis and lumbar spine unremarkable and remained stable with no evidence of nerve root encroachment ?Continue to encourage patient to get out of bed and mobilize to minimize back pain. ?  ?Thrombocytopenia-resolved as of 06/01/2021 ?Platelets stable and > 100,000 ?  ?Elevated LFTs ?See Hepatitis B notation ?  ?  ?  ?  ?Subjective:  ?Continues to report improvement in back and foot pain.  Also no further loose or watery stools.  History obtained  utilizing translator ?  ?Physical Exam: ?      ?Vitals:  ?  06/29/21 1540 06/29/21 1956 06/30/21 0451 06/30/21 0824  ?BP: 119/85 119/86 98/70 105/72  ?Pulse: 92 92 89 91  ?Resp: 17 19 20 17   ?Temp: 97.7 ?F (36.5 ?C) 97.8 ?F (36.6 ?C) 97.9 ?F (36.6 ?C) 98 ?F (36.7 ?C)  ?TempSrc: Oral Oral Oral    ?SpO2: 100% 99% 98% 99%  ?Weight:          ?Height:          ?  ?General: Calm, NAD ?Respiratory: Stable on room air, anterior lung sounds clear to auscultation, respiratory effort and pattern normal ?Cardiovascular: Normotensive, S1 S2, regular pulse.  ?Gastrointestinal: Abdomen is nondistended, soft and nontender.  Positive bowel sounds noted.  LBM 3/30 ?Neurological: Cranial nerves II through XII are grossly intact, patient able to move all extremities x4 with strength 5/5.  Sensation is intact ?Psychiatry: Judgement and insight intact.  Mood & affect flat  ?  ?Data Reviewed: ?There are no new results to review at this time. ?  ? DVT Prophylaxis   ?., Place ted hose  ?Place ted hose  ?  ?  ?Family Communication:  ?No family  available noting they have all returned to [place] ?  ?Disposition: ?Remains inpatient appropriate because: Unsafe discharge plan-deconditioned with ambulation concerns primarily related to recurrent orthostasis-also uncontrolled diabetes at presentation; severe depression along with language barrier contributing to patient's ability for self-care-care be recommends short-term SNF for rehabilitation; also patient does not have a way to pay for SNF or rehab ?Please review any new documentation from CM/LCSW ?  ?  ?Planned Discharge Destination:  ?Family care home/Rest home or ALF ?  ?  ?COVID vaccination status:  ?Unknown ?  ?Consultants: ?Psychiatry ?Gastroenterology ?Procedures: ?Echocardiogram ?Antibiotics: ?None ?  ?  ?  ?Time spent: 15 minutes ?  ?Author: ?Junious SilkAllison Mele Sylvester, NP ?06/30/2021 8:30 AM ?

## 2021-07-02 DIAGNOSIS — R338 Other retention of urine: Secondary | ICD-10-CM | POA: Diagnosis not present

## 2021-07-02 LAB — GLUCOSE, CAPILLARY
Glucose-Capillary: 108 mg/dL — ABNORMAL HIGH (ref 70–99)
Glucose-Capillary: 115 mg/dL — ABNORMAL HIGH (ref 70–99)
Glucose-Capillary: 195 mg/dL — ABNORMAL HIGH (ref 70–99)
Glucose-Capillary: 82 mg/dL (ref 70–99)

## 2021-07-02 NOTE — Progress Notes (Signed)
PROGRESS NOTE ? ?Brief Narrative: ?Garrett [name] is a 36 y.o. [place] male with a history of MDD w/psychosis, T2DM and orthostatic hypotension who initially presented 03/04/2021 for catatonic symptoms, found to have hyperglycemia without DKA. This was improved and EDP medically cleared the patient for psychiatric assessment, inpatient psychiatric placement was recommended and he held in the ED for 2 months. Metformin was adjusted downward with improvement in diarrhea symptoms. He had intermittent orthostatic hypotension with falls, and also had hypoglycemic events. Ultimately was admitted to medicine services and remains awaiting placement.  ? ?Subjective: ?Pt has no complaints this morning. Seen walking with steady gait with mobility specialist making several laps today.  ? ?Objective: ?BP 103/80 (BP Location: Right Arm)   Pulse 86   Temp (!) 97.5 ?F (36.4 ?C) (Oral)   Resp 17   Ht 5\' 2"  (1.575 m)   Wt 49.3 kg   SpO2 100%   BMI 19.88 kg/m?   ?Gen: No distress ?Pulm: Clear and nonlabored  ?CV: RRR, no murmur, no JVD, no edema ?GI: Soft, NT, ND, +BS  ?Neuro: Alert, steady gait. ?Skin: No rashes, lesions or ulcers. Widespread scars. ? ?Assessment & Plan: ?This patient remains medically stable for discharge with plans to go to ALF soon. Plan will include discharging on oral diabetes medications, psychiatric medications, and pursue outpatient treatment for chronic hepatitis B. ? ? , MD ?Pager on amion ?07/02/2021, 12:44 PM   ?

## 2021-07-02 NOTE — Plan of Care (Signed)

## 2021-07-02 NOTE — Progress Notes (Signed)
Mobility Specialist Progress Note: ? ? 07/02/21 1140  ?Mobility  ?Activity Ambulated with assistance in hallway  ?Level of Assistance Independent  ?Assistive Device None  ?Distance Ambulated (ft) 1120 ft  ?Activity Response Tolerated well  ?$Mobility charge 1 Mobility  ? ?Pt received in bed willing to participate in mobility. Complaints of foot pain. Left in bed with call bell in reach and all needs met.  ? ?  ?Mobility Specialist ?Primary Phone [phone] ? ?

## 2021-07-03 DIAGNOSIS — R338 Other retention of urine: Secondary | ICD-10-CM | POA: Diagnosis not present

## 2021-07-03 LAB — GLUCOSE, CAPILLARY
Glucose-Capillary: 122 mg/dL — ABNORMAL HIGH (ref 70–99)
Glucose-Capillary: 110 mg/dL — ABNORMAL HIGH (ref 70–99)
Glucose-Capillary: 127 mg/dL — ABNORMAL HIGH (ref 70–99)
Glucose-Capillary: 97 mg/dL (ref 70–99)

## 2021-07-03 NOTE — Progress Notes (Signed)
PROGRESS NOTE ? ?Brief Narrative: ?Garrett [name] is a 36 y.o. [place] male with a history of MDD w/psychosis, T2DM and orthostatic hypotension who initially presented 03/04/2021 for catatonic symptoms, found to have hyperglycemia without DKA. This was improved and EDP medically cleared the patient for psychiatric assessment, inpatient psychiatric placement was recommended and he held in the ED for 2 months. Metformin was adjusted downward with improvement in diarrhea symptoms. He had intermittent orthostatic hypotension with falls, and also had hypoglycemic events. Ultimately was admitted to medicine services and remains awaiting placement.  ? ?Subjective: ?No complaints today. ? ?Objective: ?BP 104/78 (BP Location: Right Arm)   Pulse 95   Temp 97.6 ?F (36.4 ?C) (Oral)   Resp 18   Ht 5\' 2"  (1.575 m)   Wt 49.3 kg   SpO2 100%   BMI 19.88 kg/m?   ?Gen: No distress ?Pulm: Clear, nonlabored ?CV: RRR ?Neuro: Alert, steady gait.  ? ?Assessment & Plan: ?This patient remains medically stable for discharge with plans to go to ALF soon. Quantiferon testing remains pending. Plan will include discharging on oral diabetes medications, psychiatric medications, and pursue outpatient treatment for chronic hepatitis B. ? ? , MD ?Pager on amion ?07/03/2021, 12:24 PM   ?

## 2021-07-03 NOTE — Progress Notes (Signed)
Mobility Specialist Progress Note: ? ? 07/03/21 1124  ?Mobility  ?Activity Ambulated with assistance in hallway  ?Level of Assistance Independent  ?Assistive Device None  ?Distance Ambulated (ft) 1120 ft  ?Activity Response Tolerated well  ?$Mobility charge 1 Mobility  ? ?Pt received in bed willing to participate in mobility. Complaints of bilateral foot pain. Left in bed with call bell in reach and all needs met.  ? ?Shadi Larner ?Mobility Specialist ?Primary Phone [phone] ? ?

## 2021-07-04 DIAGNOSIS — E119 Type 2 diabetes mellitus without complications: Secondary | ICD-10-CM | POA: Diagnosis not present

## 2021-07-04 DIAGNOSIS — G629 Polyneuropathy, unspecified: Secondary | ICD-10-CM | POA: Diagnosis not present

## 2021-07-04 DIAGNOSIS — Z794 Long term (current) use of insulin: Secondary | ICD-10-CM | POA: Diagnosis not present

## 2021-07-04 LAB — GLUCOSE, CAPILLARY
Glucose-Capillary: 115 mg/dL — ABNORMAL HIGH (ref 70–99)
Glucose-Capillary: 194 mg/dL — ABNORMAL HIGH (ref 70–99)
Glucose-Capillary: 159 mg/dL — ABNORMAL HIGH (ref 70–99)
Glucose-Capillary: 139 mg/dL — ABNORMAL HIGH (ref 70–99)

## 2021-07-04 LAB — QUANTIFERON-TB GOLD PLUS: QuantiFERON-TB Gold Plus: NEGATIVE

## 2021-07-04 LAB — QUANTIFERON-TB GOLD PLUS (RQFGPL)
QuantiFERON Mitogen Value: >10 IU/mL
QuantiFERON Nil Value: 0.59 IU/mL
QuantiFERON TB1 Ag Value: 0.64 IU/mL
QuantiFERON TB2 Ag Value: 0.57 IU/mL

## 2021-07-04 NOTE — Progress Notes (Signed)
?Progress Note ?  ?  ?PatientJakarri [name] HFW:[id number] DOB: 1985/11/26 DOA: 03/04/2021     56 ?DOS: the patient was seen and examined on 06/30/2021 ?Needs interpreter/speaks Garrett [name] ?  ?Brief hospital course: ?Garrett [name] is a 36 y.o. male with medical history significant of severe depression with psychosis, T2DM and orthostatic hypotension who had been holding in ED for about 2 months time waiting on placement. He was being cared for by members in the community, but they have returned home and he no longer has care. He has been boarding in ED with some hypoglycemic episodes, diarrhea (resolved after metformin adjusted) and some orthostatic hypotension that continues to persist despite stopping his antihypertensive medication as well as addition of SSRI/mood stabilizing drugs.  While in the ER he also had an isolated episode of hyperglycemia with serum glucose 503 with a normal anion gap.  Not consistent with DKA.  Labs were checked and he was noted to have significantly elevated LFTs and with concomitant orthostasis he was referred for admission.  Currently, social work continues to work on placement.  He continues to have significant orthostasis that is limiting his ability to ambulate. ?  ?  ?  ?Assessment and Plan: ?Controlled type 2 diabetes mellitus without complication, with long-term current use of insulin (HCC) ?Hga1c of 5.9 04/2021 ?HOME MEDS: Lantus 40 units daily and Metformin XR 500 mg daily ?Dc'd metformin 2/2 diarrhea.   ?CBGs well controlled on Tradjenta, Glucotrol and Semglee ?  ?  ?  ?Peripheral neuropathy ?Patient reporting pain in the bottom of both feet but assessment limited due to lack of translation services equipment availability ?Given his longstanding history of diabetes I started low-dose Neurontin 100 mg BID for suspected neuropathic pain ?  ?Chronic Hepatitis B ?GI consulted-they suspected chronic Hep B with worsening of LFTs in the setting of ischemic injury during orthostasis vs. Drug  induced liver injury (risperdal) ?After DC needs referral to ID for management of chronic hep B ?AFP tumor marker slightly elevated at 10 c/w acute exacerbation and hepatitis process -CT abdomen without any solid liver masses ?GI recommends outpatient follow-up at [facility] hepatology clinic vs with ID here in [place].  ?-Although may be a candidate for hepatitis B treatment would need to be initiated in the outpatient setting with subsequent insurance authorization to avoid interruption in treatment.  Also has severe depression which may influence decision to initiate hepatitis B medical therapy. ?  ?  ?Constipation (resolved) ?Patient had had issues with bowel motility early in the hospitalization ?Today when asked if he had anything else to discuss he reported that he has been having intermittent issues with nonpainful watery or loose stools that have not had blood in them ?In review of the chart he has been on scheduled MiraLAX and Senokot which have been changed to as needed ?We will add Florastor ?  ?Orthostatic hypotension likely 2/2 autonomic dysfunction-resolved as of 06/01/2021 ?Has had episodic orthostasis while in ED during the past 2 months and received intermittent IV fluids ?TSH normal, cortisol checked on 04/14/2021 low at 3.1 but cosyntropin stim test on 2/6 baseline cortisol 11 which ruled out adrenal insufficiency ?-continue TED hose ?-Cont Midodrine ?  ?MDD (major depressive disorder), recurrent, severe, with psychosis (HCC) ?Appreciate psychiatry assistance ?Stable on lexapro and cogentin ?Risperidone discontinued 2/2 elevated LFTs and Invega initiated by the psychiatric team ?Social isolation due to language barrier remains a significant problem and is likely influencing his depression ?  ?Anemia ?Iron panel: Ferritin 696.  B12 in December 2021: 750 and folate 16.5. ?Hemoglobin stable in the 12 g range (3/11).  Outpatient follow-up ?  ?Acute urinary retention-resolved as of 06/01/2021 ?Resolved ?   ?Physical deconditioning ?Resolved.  Able to ambulate well.  Primarily limited by deconditioning preference to stay in the bed most days ?A physical therapy consult is indicated based on the patient?s mobility assessment. ?  ?  ?Mobility Assessment (last 72 hours)   ?  ?  Mobility Assessment   ?  ?  Row Name 05/30/21 2000 05/29/21 1925 05/29/21 0800 05/28/21 1955   ?   ?  Does patient have an order for bedrest or is patient medically unstable No - Continue assessment No - Continue assessment No - Continue assessment No - Continue assessment    ?  What is the highest level of mobility based on the progressive mobility assessment? Level 6 (Walks independently in room and hall) - Balance while walking in room without assist - Complete Level 6 (Walks independently in room and hall) - Balance while walking in room without assist - Complete Level 6 (Walks independently in room and hall) - Balance while walking in room without assist - Complete Level 6 (Walks independently in room and hall) - Balance while walking in room without assist - Complete    ?  Is the above level different from baseline mobility prior to current illness? No - Consider discontinuing PT/OT No - Consider discontinuing PT/OT No - Consider discontinuing PT/OT No - Consider discontinuing PT/OT    ?  ?   ?  ?  ?   ?  ?  ?  ?Low back pain ?Controlled on prn Robaxin and Tylenol ?Continue to encourage increased mobility especially outside of room to minimize back pain ?Patient complaining of radicular pain.  CT of pelvis and lumbar spine unremarkable and remained stable with no evidence of nerve root encroachment ?Continue to encourage patient to get out of bed and mobilize to minimize back pain. ?  ?Thrombocytopenia-resolved as of 06/01/2021 ?Platelets stable and > 100,000 ?  ?Elevated LFTs ?See Hepatitis B notation ?  ?  ?  ?  ?Subjective:  ?Denies any foot pain.  Continues to have loose stools. ?  ?Physical Exam: ?      ?Vitals:  ?  06/29/21 1540 06/29/21  1956 06/30/21 0451 06/30/21 0824  ?BP: 119/85 119/86 98/70 105/72  ?Pulse: 92 92 89 91  ?Resp: 17 19 20 17   ?Temp: 97.7 ?F (36.5 ?C) 97.8 ?F (36.6 ?C) 97.9 ?F (36.6 ?C) 98 ?F (36.7 ?C)  ?TempSrc: Oral Oral Oral    ?SpO2: 100% 99% 98% 99%  ?Weight:          ?Height:          ?  ?General: Calm, NAD ?Respiratory: Stable on room air, anterior lung sounds clear to auscultation, respiratory effort and pattern normal ?Cardiovascular: Normotensive, S1 S2, regular pulse.  ?Gastrointestinal: Abdomen is nondistended, soft and nontender.  Positive bowel sounds noted.  LBM 3/31 ?Neurological: Cranial nerves II through XII are grossly intact, patient able to move all extremities x4 with strength 5/5.  Sensation is intact ?Psychiatry: Judgement and insight intact.  Mood & affect flat  ?  ?Data Reviewed: ?There are no new results to review at this time. ?  ? DVT Prophylaxis   ?Place ted hose  ? ?  ?  ?Family Communication:  ?No family available noting they have all returned to 4/31 ?  ?Disposition: ?Remains inpatient appropriate because: Unsafe  discharge plan-deconditioned with ambulation concerns primarily related to recurrent orthostasis-also uncontrolled diabetes at presentation; severe depression along with language barrier contributing to patient's ability for self-care-care be recommends short-term SNF for rehabilitation; also patient does not have a way to pay for SNF or rehab ?Please review any new documentation from CM/LCSW ?  ?  ?Planned Discharge Destination:  ?Family care home/Rest home or ALF ?  ?  ?COVID vaccination status:  ?Unknown ?  ?Consultants: ?Psychiatry ?Gastroenterology ?Procedures: ?Echocardiogram ?Antibiotics: ?None ?  ?  ?  ?Time spent: 15 minutes ?  ?Author: ?Junious Silk, NP ?06/30/2021 8:30 AM ?

## 2021-07-04 NOTE — Progress Notes (Signed)
Mobility Specialist Progress Note: ? ? 07/04/21 0950  ?Mobility  ?Activity Ambulated with assistance in hallway  ?Level of Assistance Independent  ?Assistive Device None  ?Distance Ambulated (ft) 1120 ft  ?Activity Response Tolerated well  ?$Mobility charge 1 Mobility  ? ?Pt received in bed willing to participate in mobility. Complaints of foot and back pain. Left in bed with call bell in reach and all needs met.  ? ?Garrett [name] ?Mobility Specialist ?Primary Phone [phone] ? ?

## 2021-07-05 DIAGNOSIS — Z794 Long term (current) use of insulin: Secondary | ICD-10-CM | POA: Diagnosis not present

## 2021-07-05 DIAGNOSIS — E119 Type 2 diabetes mellitus without complications: Secondary | ICD-10-CM | POA: Diagnosis not present

## 2021-07-05 DIAGNOSIS — F333 Major depressive disorder, recurrent, severe with psychotic symptoms: Secondary | ICD-10-CM | POA: Diagnosis not present

## 2021-07-05 LAB — GLUCOSE, CAPILLARY
Glucose-Capillary: 130 mg/dL — ABNORMAL HIGH (ref 70–99)
Glucose-Capillary: 95 mg/dL (ref 70–99)
Glucose-Capillary: 143 mg/dL — ABNORMAL HIGH (ref 70–99)
Glucose-Capillary: 116 mg/dL — ABNORMAL HIGH (ref 70–99)

## 2021-07-05 NOTE — TOC Progression Note (Signed)
Transition of Care (TOC) - Progression Note  ? ? ?Patient Details  ?Name: Garrett [name] ?MRN: [id number] ?Date of Birth: 02-29-1986 ? ?Transition of Care (TOC) CM/SW Contact  ?Curlene Labrum, RN ?Phone Number: ?07/05/2021, 2:37 PM ? ?Clinical Narrative:    ?CM spoke with Donny Pique, manager with [facility] and the facility is able to admit the patient next Tuesday, 07/12/2021.  LOG contract was provided to the facility to cover for costs for Room and Board until the patient has availability for disability check that is pending with [facility]. ? ?CM and MSW with DTP Team will continue to follow the patient for TOC to ALF facility next week. ? ? ?Expected Discharge Plan: Assisted Living ?Barriers to Discharge: Family Issues, Unsafe home situation ? ?Expected Discharge Plan and Services ?Expected Discharge Plan: Assisted Living ?In-house Referral: Development worker, community ([facility] disability pending) ?Discharge Planning Services: CM Consult ?  ?Living arrangements for the past 2 months: [place] ?                ?  ?  ?  ?  ?  ?  ?  ?  ?  ?  ? ? ?Social Determinants of Health (SDOH) Interventions ?  ? ?Readmission Risk Interventions ?   ? View : No data to display.  ?  ?  ?  ? ? ?

## 2021-07-05 NOTE — Progress Notes (Signed)
Mobility Specialist Progress Note: ? ? 07/05/21 1131  ?Mobility  ?Activity Ambulated with assistance in hallway  ?Level of Assistance Independent  ?Assistive Device None  ?Distance Ambulated (ft) 570 ft  ?Activity Response Tolerated well  ?$Mobility charge 1 Mobility  ? ?Pt received in bed willing to participate in mobility. Complaints of bilateral foot pain. Left in bed with call bell in reach and all needs met.  ? ?Garrett [name] ?Mobility Specialist ?Primary Phone [phone] ? ?

## 2021-07-05 NOTE — Plan of Care (Signed)
  Problem: Education: Goal: Knowledge of General Education information will improve Description Including pain rating scale, medication(s)/side effects and non-pharmacologic comfort measures Outcome: Progressing   Problem: Health Behavior/Discharge Planning: Goal: Ability to manage health-related needs will improve Outcome: Progressing   

## 2021-07-05 NOTE — Progress Notes (Signed)
?Progress Note ?  ?  ?PatientLyon [name] [id number] DOB: 08/08/1985 DOA: 03/04/2021     56 ?DOS: the patient was seen and examined on 06/30/2021 ?Needs interpreter/speaks [name] ?  ?Brief hospital course: ?Garrett [name] is a 36 y.o. male with medical history significant of severe depression with psychosis, T2DM and orthostatic hypotension who had been holding in ED for about 2 months time waiting on placement. He was being cared for by members in the community, but they have returned home and he no longer has care. He has been boarding in ED with some hypoglycemic episodes, diarrhea (resolved after metformin adjusted) and some orthostatic hypotension that continues to persist despite stopping his antihypertensive medication as well as addition of SSRI/mood stabilizing drugs.  While in the ER he also had an isolated episode of hyperglycemia with serum glucose 503 with a normal anion gap.  Not consistent with DKA.  Labs were checked and he was noted to have significantly elevated LFTs and with concomitant orthostasis he was referred for admission.  Currently, social work continues to work on placement.  He continues to have significant orthostasis that is limiting his ability to ambulate. ?  ?  ?  ?Assessment and Plan: ?Controlled type 2 diabetes mellitus without complication, with long-term current use of insulin (HCC) ?Hga1c of 5.9 04/2021 ?HOME MEDS: Lantus 40 units daily and Metformin XR 500 mg daily ?Dc'd metformin 2/2 diarrhea.   ?CBGs well controlled on Tradjenta, Glucotrol and Semglee ?  ?  ?Peripheral neuropathy ?Patient reporting pain in the bottom of both feet but assessment limited due to lack of translation services equipment availability ?Given his longstanding history of diabetes I started low-dose Neurontin 100 mg BID for suspected neuropathic pain ?  ?Chronic Hepatitis B ?GI consulted-they suspected chronic Hep B with worsening of LFTs in the setting of ischemic injury during orthostasis vs. Drug induced  liver injury (risperdal) ?After DC needs referral to ID for management of chronic hep B ?AFP tumor marker slightly elevated at 10 c/w acute exacerbation and hepatitis process -CT abdomen without any solid liver masses ?GI recommends outpatient follow-up at [facility] hepatology clinic vs with ID here in [place].  ?-Although may be a candidate for hepatitis B treatment would need to be initiated in the outpatient setting with subsequent insurance authorization to avoid interruption in treatment.  Also has severe depression which may influence decision to initiate hepatitis B medical therapy. ?  ?  ?Constipation (resolved) ?Resolved ?Continue Florastor ?  ?Orthostatic hypotension likely 2/2 autonomic dysfunction-resolved as of 06/01/2021 ?Has had episodic orthostasis while in ED during the past 2 months and received intermittent IV fluids ?Cortisol low at 3.1 but cosyntropin stim test with cortisol 11 which ruled out adrenal insufficiency ?Continue TED hose and midodrine ?  ?MDD (major depressive disorder), recurrent, severe, with psychosis (HCC) ?Appreciate psychiatry assistance ?Stable on lexapro and cogentin ?Risperidone discontinued 2/2 elevated LFTs and Invega initiated by the psychiatric team ?Social isolation due to language barrier remains a significant problem and is likely influencing his depression ?  ?Anemia ?Iron panel: Ferritin 696.  B12 in December 2021: 750 and folate 16.5. ?Hemoglobin stable in the 12 g range (3/11).  Outpatient follow-up ?  ?Acute urinary retention-resolved as of 06/01/2021 ?Resolved ?  ?Physical deconditioning ?Resolved.  Able to ambulate well.  Primarily limited by deconditioning preference to stay in the bed most days ?A physical therapy consult is indicated based on the patient?s mobility assessment. ?  ?  ?Mobility Assessment (last 72 hours)   ?  ?  Mobility Assessment   ?  ?  Row Name 05/30/21 2000 05/29/21 1925 05/29/21 0800 05/28/21 1955   ?   ?  Does patient have an order for  bedrest or is patient medically unstable No - Continue assessment No - Continue assessment No - Continue assessment No - Continue assessment    ?  What is the highest level of mobility based on the progressive mobility assessment? Level 6 (Walks independently in room and hall) - Balance while walking in room without assist - Complete Level 6 (Walks independently in room and hall) - Balance while walking in room without assist - Complete Level 6 (Walks independently in room and hall) - Balance while walking in room without assist - Complete Level 6 (Walks independently in room and hall) - Balance while walking in room without assist - Complete    ?  Is the above level different from baseline mobility prior to current illness? No - Consider discontinuing PT/OT No - Consider discontinuing PT/OT No - Consider discontinuing PT/OT No - Consider discontinuing PT/OT    ?  ?   ?  ?  ?   ?  ?  ?  ?Low back pain ?Controlled on prn Robaxin and Tylenol ?CT of pelvis and lumbar spine unremarkable and remained stable with no evidence of nerve root encroachment ?Continue to encourage patient to get out of bed and mobilize to minimize back pain. ?  ?Thrombocytopenia-resolved as of 06/01/2021 ?Platelets stable and > 100,000 ?  ?Elevated LFTs ?See Hepatitis B notation ?  ?  ?  ?  ?Subjective:  ?Accidentally pulled out IV with tape and dressing.  Patient handed this material to me and said it came out.  No other complaints verbalized. ?  ?Physical Exam: ?      ?Vitals:  ?  06/29/21 1540 06/29/21 1956 06/30/21 0451 06/30/21 0824  ?BP: 119/85 119/86 98/70 105/72  ?Pulse: 92 92 89 91  ?Resp: 17 19 20 17   ?Temp: 97.7 ?F (36.5 ?C) 97.8 ?F (36.6 ?C) 97.9 ?F (36.6 ?C) 98 ?F (36.7 ?C)  ?TempSrc: Oral Oral Oral    ?SpO2: 100% 99% 98% 99%  ?Weight:          ?Height:          ?  ?General: Calm, NAD ?Respiratory: Stable on room air, anterior lung sounds clear to auscultation, respiratory effort and pattern normal ?Cardiovascular: Normotensive, S1  S2, regular pulse.  ?Gastrointestinal: Abdomen is nondistended, soft and nontender.  Positive bowel sounds noted.  LBM 3/31 ?Neurological: Cranial nerves II through XII are grossly intact, patient able to move all extremities x4 with strength 5/5.  Sensation is intact ?Psychiatry: Judgement and insight intact.  Mood & affect flat  ?  ?Data Reviewed: ?There are no new results to review at this time. ?  ? DVT Prophylaxis   ?Place ted hose  ? ?  ?  ?Family Communication:  ?No family available noting they have all returned to 4/31 ?  ?Disposition: ?Remains inpatient appropriate because: Unsafe discharge plan-deconditioned with ambulation concerns primarily related to recurrent orthostasis-also uncontrolled diabetes at presentation; severe depression along with language barrier contributing to patient's ability for self-care-care be recommends short-term SNF for rehabilitation; also patient does not have a way to pay for SNF or rehab ?Please review any new documentation from CM/LCSW ?  ?  ?Planned Discharge Destination:  ?Family care home/Rest home or ALF ?  ?  ?COVID vaccination status:  ?Unknown ?  ?Consultants: ?Psychiatry ?Gastroenterology ?Procedures: ?Echocardiogram ?  Antibiotics: ?None ?  ?  ?  ?Time spent: 15 minutes ?  ?Author: ?Junious Silk, NP ?06/30/2021 8:30 AM ?

## 2021-07-06 DIAGNOSIS — E119 Type 2 diabetes mellitus without complications: Secondary | ICD-10-CM | POA: Diagnosis not present

## 2021-07-06 DIAGNOSIS — Z794 Long term (current) use of insulin: Secondary | ICD-10-CM | POA: Diagnosis not present

## 2021-07-06 DIAGNOSIS — G629 Polyneuropathy, unspecified: Secondary | ICD-10-CM | POA: Diagnosis not present

## 2021-07-06 LAB — GLUCOSE, CAPILLARY
Glucose-Capillary: 100 mg/dL — ABNORMAL HIGH (ref 70–99)
Glucose-Capillary: 152 mg/dL — ABNORMAL HIGH (ref 70–99)
Glucose-Capillary: 208 mg/dL — ABNORMAL HIGH (ref 70–99)

## 2021-07-06 NOTE — TOC Progression Note (Signed)
Transition of Care (TOC) - Progression Note  ? ? ?Patient Details  ?Name: Garrett [name] ?MRN: [id number] ?Date of Birth: 20-Oct-1985 ? ?Transition of Care (TOC) CM/SW Contact  ?Curlene Labrum, RN ?Phone Number: ?07/06/2021, 1:09 PM ? ?Clinical Narrative:    ?CM called and spoke with Anitha, CM with [facility] and the facility will have availability for admission next Tuesday, 07/12/2021.  The patient was updated in the room and is aware of pending admission.  The patient has clothing and shoes in the room that patient will be able to take with him to the facility.  TOC Team will provide cab transportation to the facility when discharged.  The patient is ambulatory and does not have need for dme.  [place] will be provided for the patient through Medicaid and facility CM is aware and has received copy of request for PCA through [facility]. ? ? ?Expected Discharge Plan: Assisted Living ?Barriers to Discharge: Family Issues, Unsafe home situation ? ?Expected Discharge Plan and Services ?Expected Discharge Plan: Assisted Living ?In-house Referral: Development worker, community ([facility] disability pending) ?Discharge Planning Services: CM Consult ?  ?Living arrangements for the past 2 months: [place] ?                ?  ?  ?  ?  ?  ?  ?  ?  ?  ?  ? ? ?Social Determinants of Health (SDOH) Interventions ?  ? ?Readmission Risk Interventions ?   ? View : No data to display.  ?  ?  ?  ? ? ?

## 2021-07-06 NOTE — Progress Notes (Signed)
Mobility Specialist Progress Note: ? ? 07/06/21 1120  ?Mobility  ?Activity Ambulated with assistance in hallway  ?Level of Assistance Independent  ?Distance Ambulated (ft) 1120 ft  ?Activity Response Tolerated well  ?$Mobility charge 1 Mobility  ? ?Pt in bed willing to participate in mobility. Complaints of foot pain. Left in bed with call bell in reach and all needs met.  ? ?Baley Shands ?Mobility Specialist ?Primary Phone [phone] ? ?

## 2021-07-06 NOTE — Progress Notes (Signed)
?Progress Note ?  ?  ?PatientHarlee [name] NLG:[id number] DOB: 11-30-1985 DOA: 03/04/2021     56 ?DOS: the patient was seen and examined on 06/30/2021 ?Needs interpreter/speaks [name] ?  ?Brief hospital course: ?Garrett [name] is a 36 y.o. male with medical history significant of severe depression with psychosis, T2DM and orthostatic hypotension who had been holding in ED for about 2 months time waiting on placement. He was being cared for by members in the community, but they have returned home and he no longer has care. He has been boarding in ED with some hypoglycemic episodes, diarrhea (resolved after metformin adjusted) and some orthostatic hypotension that continues to persist despite stopping his antihypertensive medication as well as addition of SSRI/mood stabilizing drugs.  While in the ER he also had an isolated episode of hyperglycemia with serum glucose 503 with a normal anion gap.  Not consistent with DKA.  Labs were checked and he was noted to have significantly elevated LFTs and with concomitant orthostasis he was referred for admission.  Currently, social work continues to work on placement.  He continues to have significant orthostasis that is limiting his ability to ambulate. ?  ?  ?  ?Assessment and Plan: ?Controlled type 2 diabetes mellitus without complication, with long-term current use of insulin (HCC) ?Hga1c of 5.9 04/2021 ?HOME MEDS: Lantus 40 units daily and Metformin XR 500 mg daily ?Dc'd metformin 2/2 diarrhea.   ?CBGs well controlled on Tradjenta, Glucotrol and Semglee ?  ?  ?Peripheral neuropathy ?Patient reporting pain in the bottom of both feet but assessment limited due to lack of translation services equipment availability ?Given his longstanding history of diabetes I started low-dose Neurontin 100 mg BID for suspected neuropathic pain ?  ?Chronic Hepatitis B ?GI consulted-they suspected chronic Hep B with worsening of LFTs in the setting of ischemic injury during orthostasis vs. Drug induced  liver injury (risperdal) ?After DC needs referral to ID for management of chronic hep B ?AFP tumor marker slightly elevated at 10 c/w acute exacerbation and hepatitis process -CT abdomen without any solid liver masses ?GI recommends outpatient follow-up at [facility] hepatology clinic vs with ID here in [place].  ?-Although may be a candidate for hepatitis B treatment would need to be initiated in the outpatient setting with subsequent insurance authorization to avoid interruption in treatment.  Also has severe depression which may influence decision to initiate hepatitis B medical therapy. ?  ?  ?Constipation (resolved) ?Resolved ?Continue Florastor ?  ?Orthostatic hypotension likely 2/2 autonomic dysfunction-resolved as of 06/01/2021 ?Has had episodic orthostasis while in ED during the past 2 months and received intermittent IV fluids ?Cortisol low at 3.1 but cosyntropin stim test with cortisol 11 which ruled out adrenal insufficiency ?Continue TED hose and midodrine ?  ?MDD (major depressive disorder), recurrent, severe, with psychosis (HCC) ?Appreciate psychiatry assistance ?Stable on lexapro and cogentin ?Risperidone discontinued 2/2 elevated LFTs and Invega initiated by the psychiatric team ?Social isolation due to language barrier remains a significant problem and is likely influencing his depression ?  ?Anemia ?Iron panel: Ferritin 696.  B12 in December 2021: 750 and folate 16.5. ?Hemoglobin stable in the 12 g range (3/11).  Outpatient follow-up ?  ?Acute urinary retention-resolved as of 06/01/2021 ?Resolved ?  ?Physical deconditioning ?Resolved.  Able to ambulate well.  Primarily limited by deconditioning preference to stay in the bed most days ?A physical therapy consult is indicated based on the patient?s mobility assessment. ?  ?  ?Mobility Assessment (last 72 hours)   ?  ?  Mobility Assessment   ?  ?  Row Name 05/30/21 2000 05/29/21 1925 05/29/21 0800 05/28/21 1955   ?   ?  Does patient have an order for  bedrest or is patient medically unstable No - Continue assessment No - Continue assessment No - Continue assessment No - Continue assessment    ?  What is the highest level of mobility based on the progressive mobility assessment? Level 6 (Walks independently in room and hall) - Balance while walking in room without assist - Complete Level 6 (Walks independently in room and hall) - Balance while walking in room without assist - Complete Level 6 (Walks independently in room and hall) - Balance while walking in room without assist - Complete Level 6 (Walks independently in room and hall) - Balance while walking in room without assist - Complete    ?  Is the above level different from baseline mobility prior to current illness? No - Consider discontinuing PT/OT No - Consider discontinuing PT/OT No - Consider discontinuing PT/OT No - Consider discontinuing PT/OT    ?  ?   ?  ?  ?   ?  ?  ?  ?Low back pain ?Controlled on prn Robaxin and Tylenol ?CT of pelvis and lumbar spine unremarkable and remained stable with no evidence of nerve root encroachment ?Continue to encourage patient to get out of bed and mobilize to minimize back pain. ?  ?Thrombocytopenia-resolved as of 06/01/2021 ?Platelets stable and > 100,000 ?  ?Elevated LFTs ?See Hepatitis B notation ?  ?  ?  ?  ?Subjective:  ?No complaints verbalized when asked through a translator.  Using translator began teaching patient symptoms of hypoglycemia and how to use the English term "sugar low" so he could notify staff if he felt he was becoming sick with low sugar so CBG could be checked ?  ?Physical Exam: ?      ?Vitals:  ?  06/29/21 1540 06/29/21 1956 06/30/21 0451 06/30/21 0824  ?BP: 119/85 119/86 98/70 105/72  ?Pulse: 92 92 89 91  ?Resp: 17 19 20 17   ?Temp: 97.7 ?F (36.5 ?C) 97.8 ?F (36.6 ?C) 97.9 ?F (36.6 ?C) 98 ?F (36.7 ?C)  ?TempSrc: Oral Oral Oral    ?SpO2: 100% 99% 98% 99%  ?Weight:          ?Height:          ?  ?General: Calm, NAD ?Respiratory: Stable on room  air, anterior lung sounds clear to auscultation, respiratory effort and pattern normal ?Cardiovascular: Normotensive, S1 S2, regular pulse.  ?Gastrointestinal: Abdomen is nondistended, soft and nontender.  Positive bowel sounds noted.  LBM 3/31 ?Neurological: Cranial nerves II through XII are grossly intact, patient able to move all extremities x4 with strength 5/5.  Sensation is intact ?Psychiatry: Judgement and insight intact.  Mood & affect flat  ?  ?Data Reviewed: ?There are no new results to review at this time. ?  ? DVT Prophylaxis   ?Place ted hose  ? ?  ?  ?Family Communication:  ?No family available noting they have all returned to 4/31 ?  ?Disposition: ?Remains inpatient appropriate because: Unsafe discharge plan-deconditioned with ambulation concerns primarily related to recurrent orthostasis-also uncontrolled diabetes at presentation; severe depression along with language barrier contributing to patient's ability for self-care-care be recommends short-term SNF for rehabilitation; also patient does not have a way to pay for SNF or rehab ?Please review any new documentation from CM/LCSW ?  ?  ?Planned Discharge Destination:  ?Family  care home/Rest home or ALF ?  ?  ?COVID vaccination status:  ?Unknown ?  ?Consultants: ?Psychiatry ?Gastroenterology ?Procedures: ?Echocardiogram ?Antibiotics: ?None ?  ?  ?  ?Time spent: 15 minutes ?  ?Author: ?Junious SilkAllison Zellie Jenning, NP ?06/30/2021 8:30 AM ?

## 2021-07-07 DIAGNOSIS — E119 Type 2 diabetes mellitus without complications: Secondary | ICD-10-CM | POA: Diagnosis not present

## 2021-07-07 DIAGNOSIS — G629 Polyneuropathy, unspecified: Secondary | ICD-10-CM | POA: Diagnosis not present

## 2021-07-07 DIAGNOSIS — Z794 Long term (current) use of insulin: Secondary | ICD-10-CM | POA: Diagnosis not present

## 2021-07-07 LAB — GLUCOSE, CAPILLARY
Glucose-Capillary: 101 mg/dL — ABNORMAL HIGH (ref 70–99)
Glucose-Capillary: 118 mg/dL — ABNORMAL HIGH (ref 70–99)
Glucose-Capillary: 190 mg/dL — ABNORMAL HIGH (ref 70–99)
Glucose-Capillary: 171 mg/dL — ABNORMAL HIGH (ref 70–99)

## 2021-07-07 MED ORDER — MICONAZOLE NITRATE POWD: Freq: Three times a day (TID) | Status: DC

## 2021-07-07 NOTE — Progress Notes (Signed)
?Progress Note ?  ?  ?PatientLyon [name] [name] DOB: 08/08/1985 DOA: 03/04/2021     56 ?DOS: the patient was seen and examined on 06/30/2021 ?Needs interpreter/speaks [name] ?  ?Brief hospital course: ?Garrett [name] is a 36 y.o. male with medical history significant of severe depression with psychosis, T2DM and orthostatic hypotension who had been holding in ED for about 2 months time waiting on placement. He was being cared for by members in the community, but they have returned home and he no longer has care. He has been boarding in ED with some hypoglycemic episodes, diarrhea (resolved after metformin adjusted) and some orthostatic hypotension that continues to persist despite stopping his antihypertensive medication as well as addition of SSRI/mood stabilizing drugs.  While in the ER he also had an isolated episode of hyperglycemia with serum glucose 503 with a normal anion gap.  Not consistent with DKA.  Labs were checked and he was noted to have significantly elevated LFTs and with concomitant orthostasis he was referred for admission.  Currently, social work continues to work on placement.  He continues to have significant orthostasis that is limiting his ability to ambulate. ?  ?  ?  ?Assessment and Plan: ?Controlled type 2 diabetes mellitus without complication, with long-term current use of insulin (HCC) ?Hga1c of 5.9 04/2021 ?HOME MEDS: Lantus 40 units daily and Metformin XR 500 mg daily ?Dc'd metformin 2/2 diarrhea.   ?CBGs well controlled on Tradjenta, Glucotrol and Semglee ?  ?  ?Peripheral neuropathy ?Patient reporting pain in the bottom of both feet but assessment limited due to lack of translation services equipment availability ?Given his longstanding history of diabetes I started low-dose Neurontin 100 mg BID for suspected neuropathic pain ?  ?Chronic Hepatitis B ?GI consulted-they suspected chronic Hep B with worsening of LFTs in the setting of ischemic injury during orthostasis vs. Drug induced  liver injury (risperdal) ?After DC needs referral to ID for management of chronic hep B ?AFP tumor marker slightly elevated at 10 c/w acute exacerbation and hepatitis process -CT abdomen without any solid liver masses ?GI recommends outpatient follow-up at [facility] hepat[facility] clinic vs with ID here in [place].  ?-Although may be a candidate for hepatitis B treatment would need to be initiated in the outpatient setting with subsequent insurance authorization to avoid interruption in treatment.  Also has severe depression which may influence decision to initiate hepatitis B medical therapy. ?  ?  ?Constipation (resolved) ?Resolved ?Continue Florastor ?  ?Orthostatic hypotension likely 2/2 autonomic dysfunction-resolved as of 06/01/2021 ?Has had episodic orthostasis while in ED during the past 2 months and received intermittent IV fluids ?Cortisol low at 3.1 but cosyntropin stim test with cortisol 11 which ruled out adrenal insufficiency ?Continue TED hose and midodrine ?  ?MDD (major depressive disorder), recurrent, severe, with psychosis (HCC) ?Appreciate psychiatry assistance ?Stable on lexapro and cogentin ?Risperidone discontinued 2/2 elevated LFTs and Invega initiated by the psychiatric team ?Social isolation due to language barrier remains a significant problem and is likely influencing his depression ?  ?Anemia ?Iron panel: Ferritin 696.  B12 in December 2021: 750 and folate 16.5. ?Hemoglobin stable in the 12 g range (3/11).  Outpatient follow-up ?  ?Acute urinary retention-resolved as of 06/01/2021 ?Resolved ?  ?Physical deconditioning ?Resolved.  Able to ambulate well.  Primarily limited by deconditioning preference to stay in the bed most days ?A physical therapy consult is indicated based on the patient?s mobility assessment. ?  ?  ?Mobility Assessment (last 72 hours)   ?  ?  Mobility Assessment   ?  ?  Row Name 05/30/21 2000 05/29/21 1925 05/29/21 0800 05/28/21 1955   ?   ?  Does patient have an order for  bedrest or is patient medically unstable No - Continue assessment No - Continue assessment No - Continue assessment No - Continue assessment    ?  What is the highest level of mobility based on the progressive mobility assessment? Level 6 (Walks independently in room and hall) - Balance while walking in room without assist - Complete Level 6 (Walks independently in room and hall) - Balance while walking in room without assist - Complete Level 6 (Walks independently in room and hall) - Balance while walking in room without assist - Complete Level 6 (Walks independently in room and hall) - Balance while walking in room without assist - Complete    ?  Is the above level different from baseline mobility prior to current illness? No - Consider discontinuing PT/OT No - Consider discontinuing PT/OT No - Consider discontinuing PT/OT No - Consider discontinuing PT/OT    ?  ?   ?  ?  ?   ?  ?  ?  ?Low back pain ?Controlled on prn Robaxin and Tylenol ?CT of pelvis and lumbar spine unremarkable and remained stable with no evidence of nerve root encroachment ?Continue to encourage patient to get out of bed and mobilize to minimize back pain. ?  ?Thrombocytopenia-resolved as of 06/01/2021 ?Platelets stable and > 100,000 ?  ?Elevated LFTs ?See Hepatitis B notation ? ?Foot fungus ?Begin Micatin powder every shift ?  ?  ?  ?  ?Subjective:  ?No specific complaints.  Very talkative today.  Continues to remain focused on his wife's death as the etiology for his downfall. ?  ?Physical Exam: ?      ?Vitals:  ?  06/29/21 1540 06/29/21 1956 06/30/21 0451 06/30/21 0824  ?BP: 119/85 119/86 98/70 105/72  ?Pulse: 92 92 89 91  ?Resp: 17 19 20 17   ?Temp: 97.7 ?F (36.5 ?C) 97.8 ?F (36.6 ?C) 97.9 ?F (36.6 ?C) 98 ?F (36.7 ?C)  ?TempSrc: Oral Oral Oral    ?SpO2: 100% 99% 98% 99%  ?Weight:          ?Height:          ?  ?General: Calm, NAD ?Respiratory: Stable on room air, anterior lung sounds clear to auscultation, respiratory effort and pattern  normal ?Cardiovascular: Normotensive, S1 S2, regular pulse.  ?Gastrointestinal: Abdomen is nondistended, soft and nontender.  Positive bowel sounds noted.  LBM 3/31 ?Neurological: Cranial nerves II through XII are grossly intact, patient able to move all extremities x4 with strength 5/5.  Sensation is intact ?Psychiatry: Judgement and insight intact.  Mood & affect flat  ?  ?Data Reviewed: ?There are no new results to review at this time. ?  ? DVT Prophylaxis   ?Place ted hose  ? ?  ?  ?Family Communication:  ?No family available noting they have all returned to 4/31 ?  ?Disposition: ?Remains inpatient appropriate because: Unsafe discharge plan-deconditioned with ambulation concerns primarily related to recurrent orthostasis-also uncontrolled diabetes at presentation; severe depression along with language barrier contributing to patient's ability for self-care-care be recommends short-term SNF for rehabilitation; also patient does not have a way to pay for SNF or rehab ?Please review any new documentation from CM/LCSW ?  ?  ?Planned Discharge Destination:  ?Family care home/Rest home or ALF ?  ?  ?COVID vaccination status:  ?Unknown ?  ?  Consultants: ?Psychiatry ?Gastroenterology ?Procedures: ?Echocardiogram ?Antibiotics: ?None ?  ?  ?  ?Time spent: 15 minutes ?  ?Author: ?Junious Silk, NP ?06/30/2021 8:30 AM ?

## 2021-07-07 NOTE — Progress Notes (Signed)
Mobility Specialist Progress Note: ? ? 07/07/21 1107  ?Mobility  ?Activity Ambulated with assistance in hallway  ?Level of Assistance Independent  ?Assistive Device None  ?Distance Ambulated (ft) 1120 ft  ?Activity Response Tolerated well  ?$Mobility charge 1 Mobility  ? ?Pt received in bed willing to participate in mobility. No complaints of pain.  ?Left in bed with call bell in reach and all needs met.  ? ?Jarrah Babich ?Mobility Specialist ?Primary Phone [phone] ? ?

## 2021-07-08 DIAGNOSIS — F333 Major depressive disorder, recurrent, severe with psychotic symptoms: Secondary | ICD-10-CM | POA: Diagnosis not present

## 2021-07-08 DIAGNOSIS — E119 Type 2 diabetes mellitus without complications: Secondary | ICD-10-CM | POA: Diagnosis not present

## 2021-07-08 DIAGNOSIS — Z794 Long term (current) use of insulin: Secondary | ICD-10-CM | POA: Diagnosis not present

## 2021-07-08 DIAGNOSIS — G629 Polyneuropathy, unspecified: Secondary | ICD-10-CM | POA: Diagnosis not present

## 2021-07-08 LAB — GLUCOSE, CAPILLARY
Glucose-Capillary: 131 mg/dL — ABNORMAL HIGH (ref 70–99)
Glucose-Capillary: 137 mg/dL — ABNORMAL HIGH (ref 70–99)
Glucose-Capillary: 101 mg/dL — ABNORMAL HIGH (ref 70–99)

## 2021-07-08 NOTE — TOC Progression Note (Signed)
Transition of Care (TOC) - Progression Note  ? ? ?Patient Details  ?Name: Osman Buckels ?MRN: [id number] ?Date of Birth: 1985-04-22 ? ?Transition of Care (TOC) CM/SW Contact  ?Janae Bridgeman, RN ?Phone Number: ?07/08/2021, 1:32 PM ? ?Clinical Narrative:    ?CM called and spoke with Anitha, owner of [facility] and she will have bed availability for the patient on Tuesday, 07/12/2021.  The patient will be transported to the facility by cab.  Anitha, owner has received the LOG for placement and Gold TB test was negative.  The patient is aware of pending discharge to the ALF facility on Tuesday. ? ? ?Expected Discharge Plan: Assisted Living ?Barriers to Discharge: Family Issues, Unsafe home situation ? ?Expected Discharge Plan and Services ?Expected Discharge Plan: Assisted Living ?In-house Referral: Artist ([facility] pending) ?Discharge Planning Services: CM Consult ?  ?Living arrangements for the past 2 months: Single Family Home ?                ?  ?  ?  ?  ?  ?  ?  ?  ?  ?  ? ? ?Social Determinants of Health (SDOH) Interventions ?  ? ?Readmission Risk Interventions ?   ? View : No data to display.  ?  ?  ?  ? ? ?

## 2021-07-08 NOTE — Progress Notes (Signed)
?Progress Note ?  ?  ?PatientLyon [name] [name]:[id number] DOB: 08/08/1985 DOA: 03/04/2021     56 ?DOS: the patient was seen and examined on 06/30/2021 ?Needs interpreter/speaks [name] ?  ?Brief hospital course: ?Garrett [name] is a 36 y.o. male with medical history significant of severe depression with psychosis, T2DM and orthostatic hypotension who had been holding in ED for about 2 months time waiting on placement. He was being cared for by members in the community, but they have returned home and he no longer has care. He has been boarding in ED with some hypoglycemic episodes, diarrhea (resolved after metformin adjusted) and some orthostatic hypotension that continues to persist despite stopping his antihypertensive medication as well as addition of SSRI/mood stabilizing drugs.  While in the ER he also had an isolated episode of hyperglycemia with serum glucose 503 with a normal anion gap.  Not consistent with DKA.  Labs were checked and he was noted to have significantly elevated LFTs and with concomitant orthostasis he was referred for admission.  Currently, social work continues to work on placement.  He continues to have significant orthostasis that is limiting his ability to ambulate. ?  ?  ?  ?Assessment and Plan: ?Controlled type 2 diabetes mellitus without complication, with long-term current use of insulin (HCC) ?Hga1c of 5.9 04/2021 ?HOME MEDS: Lantus 40 units daily and Metformin XR 500 mg daily ?Dc'd metformin 2/2 diarrhea.   ?CBGs well controlled on Tradjenta, Glucotrol and Semglee ?  ?  ?Peripheral neuropathy ?Patient reporting pain in the bottom of both feet but assessment limited due to lack of translation services equipment availability ?Given his longstanding history of diabetes I started low-dose Neurontin 100 mg BID for suspected neuropathic pain ?  ?Chronic Hepatitis B ?GI consulted-they suspected chronic Hep B with worsening of LFTs in the setting of ischemic injury during orthostasis vs. Drug induced  liver injury (risperdal) ?After DC needs referral to ID for management of chronic hep B ?AFP tumor marker slightly elevated at 10 c/w acute exacerbation and hepatitis process -CT abdomen without any solid liver masses ?GI recommends outpatient follow-up at [facility] hepatology clinic vs with ID here in [place].  ?-Although may be a candidate for hepatitis B treatment would need to be initiated in the outpatient setting with subsequent insurance authorization to avoid interruption in treatment.  Also has severe depression which may influence decision to initiate hepatitis B medical therapy. ?  ?  ?Constipation (resolved) ?Resolved ?Continue Florastor ?  ?Orthostatic hypotension likely 2/2 autonomic dysfunction-resolved as of 06/01/2021 ?Has had episodic orthostasis while in ED during the past 2 months and received intermittent IV fluids ?Cortisol low at 3.1 but cosyntropin stim test with cortisol 11 which ruled out adrenal insufficiency ?Continue TED hose and midodrine ?  ?MDD (major depressive disorder), recurrent, severe, with psychosis (HCC) ?Appreciate psychiatry assistance ?Stable on lexapro and cogentin ?Risperidone discontinued 2/2 elevated LFTs and Invega initiated by the psychiatric team ?Social isolation due to language barrier remains a significant problem and is likely influencing his depression ?  ?Anemia ?Iron panel: Ferritin 696.  B12 in December 2021: 750 and folate 16.5. ?Hemoglobin stable in the 12 g range (3/11).  Outpatient follow-up ?  ?Acute urinary retention-resolved as of 06/01/2021 ?Resolved ?  ?Physical deconditioning ?Resolved.  Able to ambulate well.  Primarily limited by deconditioning preference to stay in the bed most days ?A physical therapy consult is indicated based on the patient?s mobility assessment. ?  ?  ?Mobility Assessment (last 72 hours)   ?  ?  Mobility Assessment   ?  ?  Row Name 05/30/21 2000 05/29/21 1925 05/29/21 0800 05/28/21 1955   ?   ?  Does patient have an order for  bedrest or is patient medically unstable No - Continue assessment No - Continue assessment No - Continue assessment No - Continue assessment    ?  What is the highest level of mobility based on the progressive mobility assessment? Level 6 (Walks independently in room and hall) - Balance while walking in room without assist - Complete Level 6 (Walks independently in room and hall) - Balance while walking in room without assist - Complete Level 6 (Walks independently in room and hall) - Balance while walking in room without assist - Complete Level 6 (Walks independently in room and hall) - Balance while walking in room without assist - Complete    ?  Is the above level different from baseline mobility prior to current illness? No - Consider discontinuing PT/OT No - Consider discontinuing PT/OT No - Consider discontinuing PT/OT No - Consider discontinuing PT/OT    ?  ?   ?  ?  ?   ?  ?  ?  ?Low back pain ?Controlled on prn Robaxin and Tylenol ?CT of pelvis and lumbar spine unremarkable and remained stable with no evidence of nerve root encroachment ?Continue to encourage patient to get out of bed and mobilize to minimize back pain. ?  ?Thrombocytopenia-resolved as of 06/01/2021 ?Platelets stable and > 100,000 ?  ?Elevated LFTs ?See Hepatitis B notation ? ?Foot fungus ?Cont Micatin powder every shift ?  ?  ?  ?  ?Subjective:  ?In bed-just awakened. No complaints. Had not eaten break fast yet ?  ?Physical Exam: ?      ?Vitals:  ?  06/29/21 1540 06/29/21 1956 06/30/21 0451 06/30/21 0824  ?BP: 119/85 119/86 98/70 105/72  ?Pulse: 92 92 89 91  ?Resp: 17 19 20 17   ?Temp: 97.7 ?F (36.5 ?C) 97.8 ?F (36.6 ?C) 97.9 ?F (36.6 ?C) 98 ?F (36.7 ?C)  ?TempSrc: Oral Oral Oral    ?SpO2: 100% 99% 98% 99%  ?Weight:          ?Height:          ?  ?General: Calm, NAD ?Respiratory: Stable on room air, anterior lung sounds clear to auscultation, respiratory effort normal ?Cardiovascular: Normotensive, S1 S2, regular pulse.  ?Gastrointestinal:  Abdomen is nondistended, soft and nontender.  Positive bowel sounds noted.  LBM 4/6 ?Neurological: Cranial nerves II through XII are grossly intact, patient able to move all extremities x4 with strength 5/5.  Sensation is intact ?Psychiatry: Judgement and insight intact.  Mood & affect flat  ?  ?Data Reviewed: ?There are no new results to review at this time. ?  ? DVT Prophylaxis   ?Place ted hose  ? ?  ?  ?Family Communication:  ?No family available noting they have all returned to 6/6 ?  ?Disposition: ?Remains inpatient appropriate because: Unsafe discharge plan-deconditioned with ambulation concerns primarily related to recurrent orthostasis-also uncontrolled diabetes at presentation; severe depression along with language barrier contributing to patient's ability for self-care-care be recommends short-term SNF for rehabilitation; also patient does not have a way to pay for SNF or rehab ?Please review any new documentation from CM/LCSW ?  ?  ?Planned Discharge Destination:  ?Family care home/Rest home or ALF ?  ?  ?COVID vaccination status:  ?Unknown ?  ?Consultants: ?Psychiatry ?Gastroenterology ?Procedures: ?Echocardiogram ?Antibiotics: ?None ?  ?  ?  ?  Time spent: 15 minutes ?  ?Author: ?Junious SilkAllison Eleny Cortez, NP ?06/30/2021 8:30 AM ?

## 2021-07-08 NOTE — Progress Notes (Signed)
Mobility Specialist Progress Note  ? ? 07/08/21 1526  ?Mobility  ?Activity Ambulated independently in hallway  ?Level of Assistance Independent  ?Assistive Device None  ?Distance Ambulated (ft) 550 ft  ?Activity Response Tolerated well  ?$Mobility charge 1 Mobility  ? ?Pt received in bed and agreeable. No complaints on walk. Returned to bed with call bell in reach.   ? ?Greeley Nation ?Mobility Specialist  ?  ?

## 2021-07-09 DIAGNOSIS — F333 Major depressive disorder, recurrent, severe with psychotic symptoms: Secondary | ICD-10-CM | POA: Diagnosis not present

## 2021-07-09 LAB — GLUCOSE, CAPILLARY
Glucose-Capillary: 108 mg/dL — ABNORMAL HIGH (ref 70–99)
Glucose-Capillary: 125 mg/dL — ABNORMAL HIGH (ref 70–99)
Glucose-Capillary: 192 mg/dL — ABNORMAL HIGH (ref 70–99)
Glucose-Capillary: 112 mg/dL — ABNORMAL HIGH (ref 70–99)

## 2021-07-09 NOTE — Progress Notes (Signed)
?PROGRESS NOTE ? ?Garrett [name]  YHC:[id number] DOB: May 20, 1985 DOA: 03/04/2021 ?PCP: Hoy Register, MD  ? ?Brief Narrative: ?Garrett [name] is a 36 y.o. male originally from [place], with medical history significant of severe depression with psychosis, T2DM and orthostatic hypotension who had been holding in ED for about 2 months time waiting on placement. He was being cared for by members in the community, but they have returned home and he no longer has care. He has been boarding in ED with some hypoglycemic episodes, diarrhea (resolved after metformin adjusted) and some orthostatic hypotension that continues to persist despite stopping his antihypertensive medication as well as addition of SSRI/mood stabilizing drugs.  While in the ER he also had an isolated episode of hyperglycemia with serum glucose 503 with a normal anion gap.  Not consistent with DKA.  Labs were checked and he was noted to have significantly elevated LFTs and with concomitant orthostasis and he was referred for admission.  Currently, social work continues to work on placement.  He continues to have significant orthostasis that is limiting his ability to ambulate.  Plan is to place him in the ALF which might happen next Tuesday ?  ?  ? ?Assessment & Plan: ? ?Active Problems: ?  MDD (major depressive disorder), recurrent, severe, with psychosis (HCC) ?  Controlled type 2 diabetes mellitus without complication, with long-term current use of insulin (HCC) ?  Anemia ?  Physical deconditioning ?  Chronic Hepatitis B ?  Low back pain ?  Peripheral neuropathy ?  Constipation ? ?Diabetes type 2: Recent hemoglobin A1c of 5.9.  Takes insulin, metformin at home.  Metformin discontinued due to diarrhea.  Currently on Tradjenta, Glucotrol, Semglee,sliding scale.  Blood sugars will be monitored ? ?Peripheral neuropathy: Likely secondary to diabetes.  Currently on Neurontin ? ?Chronic hepatitis B: GI was consulted during this hospitalization.  GI recommended  outpatient follow-up with ID/GI for treatment.  CT abdomen done during this hospitalization did not show any liver mass. ? ?Orthostatic hypotension: Likely from autonomic dysfunction from diabetes.  He was intermittently orthostatic during this hospitalization and received IV fluids.  Continue TED hose, midodrine ? ?Severe depression with psychosis: Psychiatry was following here.  On Lexapro, Cogentin,invega ? ?Normocytic anemia: Last hemoglobin in the range of 12. ? ?Physical deconditioning: Currently ambulating.  Also has low back pain.  On as needed Robaxin, Tylenol. ? ?Disposition: TOC following.  Prolonged hospitalization.  Plan for ALF ? ? ?DVT prophylaxis:Place TED hose Start: 05/09/21 2054 ?Place TED hose Start: 05/05/21 1520 ? ? ?  Code Status: Full Code ? ?Family Communication: None at bedside ? ?Patient status:Inpatient ? ?Patient is from :Home ? ?Anticipated discharge to:ALF ? ?Estimated DC date: Likely next Tuesday ? ? ?Consultants: Psychiatry, GI ? ?Procedures: None ? ?Antimicrobials:  ?Anti-infectives (From admission, onward)  ? ? None  ? ?  ? ? ?Subjective: ?Patient seen and examined at the bedside this morning.  Comfortably lying on the bed denies any complaints. ? ?Objective: ?Vitals:  ? 07/08/21 1654 07/08/21 1950 07/09/21 0355 07/09/21 5176  ?BP: (!) 136/96 120/88 108/78 93/68  ?Pulse: 90 88 85 90  ?Resp: 18 16 16 17   ?Temp: 98.1 ?F (36.7 ?C) 98.1 ?F (36.7 ?C) 98.4 ?F (36.9 ?C) 97.8 ?F (36.6 ?C)  ?TempSrc:  Oral  Oral  ?SpO2: 100% 99% 99% 99%  ?Weight:      ?Height:      ? ? ?Intake/Output Summary (Last 24 hours) at 07/09/2021 1148 ?Last data filed at 07/08/2021 2153 ?  Gross per 24 hour  ?Intake 440 ml  ?Output --  ?Net 440 ml  ? ?Filed Weights  ? 05/18/21 0811  ?Weight: 49.3 kg  ? ? ?Examination: ? ?General exam: Overall comfortable, not in distress ?HEENT: PERRL ?Respiratory system:  no wheezes or crackles  ?Cardiovascular system: S1 & S2 heard, RRR.  ?Gastrointestinal system: Abdomen is  nondistended, soft and nontender. ?Central nervous system: Alert and awake, oriented to place ?Extremities: No edema, no clubbing ,no cyanosis ?Skin: No rashes, no ulcers,no icterus   ? ? ?Data Reviewed: I have personally reviewed following labs and imaging studies ? ?CBC: ?No results for input(s): WBC, NEUTROABS, HGB, HCT, MCV, PLT in the last 168 hours. ?Basic Metabolic Panel: ?No results for input(s): NA, K, CL, CO2, GLUCOSE, BUN, CREATININE, CALCIUM, MG, PHOS in the last 168 hours. ? ? ?No results found for this or any previous visit (from the past 240 hour(s)).  ? ?Radiology Studies: ?No results found. ? ?Scheduled Meds: ? benztropine  0.5 mg Oral BID  ? escitalopram  10 mg Oral Daily  ? feeding supplement (GLUCERNA SHAKE)  237 mL Oral TID BM  ? gabapentin  100 mg Oral BID  ? glipiZIDE  5 mg Oral QAC breakfast  ? insulin aspart  0-6 Units Subcutaneous TID WC  ? insulin glargine-yfgn  15 Units Subcutaneous Daily  ? linagliptin  5 mg Oral Daily  ? LORazepam  0.5 mg Oral QHS  ? miconazole nitrate   Topical TID  ? midodrine  10 mg Oral TID WC  ? paliperidone  3 mg Oral QHS  ? saccharomyces boulardii  250 mg Oral BID  ? selenium sulfide   Topical Daily  ? sodium chloride flush  3 mL Intravenous Q12H  ? thiamine  100 mg Oral Daily  ? ?Continuous Infusions: ? sodium chloride    ? ? ? LOS: 65 days  ? ?Burnadette Pop, MD ?[facility] ?P4/11/2021, 11:48 AM   ?

## 2021-07-10 DIAGNOSIS — F333 Major depressive disorder, recurrent, severe with psychotic symptoms: Secondary | ICD-10-CM | POA: Diagnosis not present

## 2021-07-10 LAB — GLUCOSE, CAPILLARY
Glucose-Capillary: 134 mg/dL — ABNORMAL HIGH (ref 70–99)
Glucose-Capillary: 145 mg/dL — ABNORMAL HIGH (ref 70–99)
Glucose-Capillary: 140 mg/dL — ABNORMAL HIGH (ref 70–99)
Glucose-Capillary: 178 mg/dL — ABNORMAL HIGH (ref 70–99)
Glucose-Capillary: 110 mg/dL — ABNORMAL HIGH (ref 70–99)

## 2021-07-10 NOTE — Progress Notes (Signed)
?PROGRESS NOTE ? ?Garrett [name]  ERD:[id number] DOB: 09-05-1985 DOA: 03/04/2021 ?PCP: Hoy Register, MD  ? ?Brief Narrative: ?Garrett [name] is a 36 y.o. male originally from [place], with medical history significant of severe depression with psychosis, T2DM and orthostatic hypotension who had been holding in ED for about 2 months time waiting on placement. He was being cared for by members in the community, but they have returned home and he no longer has care. He has been boarding in ED with some hypoglycemic episodes, diarrhea (resolved after metformin adjusted) and some orthostatic hypotension that continues to persist despite stopping his antihypertensive medication as well as addition of SSRI/mood stabilizing drugs.  While in the ER he also had an isolated episode of hyperglycemia with serum glucose 503 with a normal anion gap.  Not consistent with DKA.  Labs were checked and he was noted to have significantly elevated LFTs and with concomitant orthostasis and he was referred for admission.  Currently, social work continues to work on placement.  He continues to have significant orthostasis that is limiting his ability to ambulate.  Plan is to place him in the ALF which might happen next Tuesday ?  ?  ? ?Assessment & Plan: ? ?Active Problems: ?  MDD (major depressive disorder), recurrent, severe, with psychosis (HCC) ?  Controlled type 2 diabetes mellitus without complication, with long-term current use of insulin (HCC) ?  Anemia ?  Physical deconditioning ?  Chronic Hepatitis B ?  Low back pain ?  Peripheral neuropathy ?  Constipation ? ?Diabetes type 2: Recent hemoglobin A1c of 5.9.  Takes insulin, metformin at home.  Metformin discontinued due to diarrhea.  Currently on Tradjenta, Glucotrol, Semglee,sliding scale.  Blood sugars will be monitored ? ?Peripheral neuropathy: Likely secondary to diabetes.  Currently on Neurontin ? ?Chronic hepatitis B: GI was consulted during this hospitalization.  GI recommended  outpatient follow-up with ID/GI for treatment.  CT abdomen done during this hospitalization did not show any liver mass. ? ?Orthostatic hypotension: Likely from autonomic dysfunction from diabetes.  He was intermittently orthostatic during this hospitalization and received IV fluids.  Continue TED hose, midodrine ? ?Severe depression with psychosis: Psychiatry was following here.  On Lexapro, Cogentin,invega ? ?Normocytic anemia: Last hemoglobin in the range of 12. ? ?Physical deconditioning: Currently ambulating.  Also has low back pain.  On as needed Robaxin, Tylenol. ? ?Disposition: TOC following.  Prolonged hospitalization.  Plan for ALF ? ? ?DVT prophylaxis:Place TED hose Start: 05/09/21 2054 ?Place TED hose Start: 05/05/21 1520 ? ? ?  Code Status: Full Code ? ?Family Communication: None at bedside ? ?Patient status:Inpatient ? ?Patient is from :Home ? ?Anticipated discharge to:ALF ? ?Estimated DC date: Likely next Tuesday ? ? ?Consultants: Psychiatry, GI ? ?Procedures: None ? ?Antimicrobials:  ?Anti-infectives (From admission, onward)  ? ? None  ? ?  ? ? ?Subjective: ?Patient seen at the bedside, lying on bed.  No complaints, comfortable ? ?Objective: ?Vitals:  ? 07/09/21 5631 07/09/21 1503 07/10/21 0507 07/10/21 4970  ?BP: 93/68 108/75 92/67 103/70  ?Pulse: 90 92 98 91  ?Resp: 17 16 16 16   ?Temp: 97.8 ?F (36.6 ?C) 98.1 ?F (36.7 ?C) 98.4 ?F (36.9 ?C) 98.4 ?F (36.9 ?C)  ?TempSrc: Oral Oral Oral Oral  ?SpO2: 99% 100% 100%   ?Weight:      ?Height:      ? ?No intake or output data in the 24 hours ending 07/10/21 1109 ? ?Filed Weights  ? 05/18/21 0811  ?Weight: 49.3  kg  ? ? ?Examination: ? ?General exam: Comfortable, lying on bed ? ?Data Reviewed: I have personally reviewed following labs and imaging studies ? ?CBC: ?No results for input(s): WBC, NEUTROABS, HGB, HCT, MCV, PLT in the last 168 hours. ?Basic Metabolic Panel: ?No results for input(s): NA, K, CL, CO2, GLUCOSE, BUN, CREATININE, CALCIUM, MG, PHOS in the  last 168 hours. ? ? ?No results found for this or any previous visit (from the past 240 hour(s)).  ? ?Radiology Studies: ?No results found. ? ?Scheduled Meds: ? benztropine  0.5 mg Oral BID  ? escitalopram  10 mg Oral Daily  ? feeding supplement (GLUCERNA SHAKE)  237 mL Oral TID BM  ? gabapentin  100 mg Oral BID  ? glipiZIDE  5 mg Oral QAC breakfast  ? insulin aspart  0-6 Units Subcutaneous TID WC  ? insulin glargine-yfgn  15 Units Subcutaneous Daily  ? linagliptin  5 mg Oral Daily  ? LORazepam  0.5 mg Oral QHS  ? miconazole nitrate   Topical TID  ? midodrine  10 mg Oral TID WC  ? paliperidone  3 mg Oral QHS  ? saccharomyces boulardii  250 mg Oral BID  ? selenium sulfide   Topical Daily  ? thiamine  100 mg Oral Daily  ? ?Continuous Infusions: ? ? ? ? LOS: 66 days  ? ?Burnadette Pop, MD ?[facility] ?P4/12/2021, 11:09 AM   ?

## 2021-07-11 ENCOUNTER — Other Ambulatory Visit (HOSPITAL_COMMUNITY): Payer: Self-pay

## 2021-07-11 DIAGNOSIS — G629 Polyneuropathy, unspecified: Secondary | ICD-10-CM | POA: Diagnosis not present

## 2021-07-11 DIAGNOSIS — E119 Type 2 diabetes mellitus without complications: Secondary | ICD-10-CM | POA: Diagnosis not present

## 2021-07-11 DIAGNOSIS — Z794 Long term (current) use of insulin: Secondary | ICD-10-CM | POA: Diagnosis not present

## 2021-07-11 LAB — GLUCOSE, CAPILLARY
Glucose-Capillary: 124 mg/dL — ABNORMAL HIGH (ref 70–99)
Glucose-Capillary: 124 mg/dL — ABNORMAL HIGH (ref 70–99)
Glucose-Capillary: 147 mg/dL — ABNORMAL HIGH (ref 70–99)
Glucose-Capillary: 160 mg/dL — ABNORMAL HIGH (ref 70–99)
Glucose-Capillary: 214 mg/dL — ABNORMAL HIGH (ref 70–99)

## 2021-07-11 MED ORDER — LINAGLIPTIN 5 MG PO TABS
5.0000 mg | ORAL_TABLET | Freq: Every day | ORAL | 3 refills | Status: DC
  Filled 2021-07-11: qty 30, 30d supply, fill #0

## 2021-07-11 MED ORDER — ACCU-CHEK SOFTCLIX LANCETS MISC
0 refills | Status: AC
  Filled 2021-07-11: qty 100, 25d supply, fill #0

## 2021-07-11 MED ORDER — MICONAZOLE NITRATE POWD
1.0000 "application " | Freq: Three times a day (TID) | 0 refills | Status: DC
  Filled 2021-07-11: qty 5, 2d supply, fill #0

## 2021-07-11 MED ORDER — INSULIN ASPART 100 UNIT/ML FLEXPEN: 0.0000 [IU] | PEN_INJECTOR | Freq: Three times a day (TID) | SUBCUTANEOUS | 3 refills | Status: DC

## 2021-07-11 MED ORDER — BENZTROPINE MESYLATE 0.5 MG PO TABS: 0.5000 mg | ORAL_TABLET | Freq: Two times a day (BID) | ORAL | 3 refills | Status: DC

## 2021-07-11 MED ORDER — ACETAMINOPHEN 325 MG PO TABS: 650.0000 mg | ORAL_TABLET | Freq: Four times a day (QID) | ORAL | 0 refills | Status: DC | PRN

## 2021-07-11 MED ORDER — GLIPIZIDE 5 MG PO TABS
5.0000 mg | ORAL_TABLET | Freq: Every day | ORAL | 3 refills | Status: DC
Start: 2021-07-12 — End: 2021-07-11
  Filled 2021-07-11: qty 30, 30d supply, fill #0

## 2021-07-11 MED ORDER — GLUCERNA SHAKE PO LIQD
237.0000 mL | Freq: Three times a day (TID) | ORAL | 0 refills | Status: AC
  Filled 2021-07-11: qty 237, 1d supply, fill #0

## 2021-07-11 MED ORDER — MIDODRINE HCL 10 MG PO TABS
10.0000 mg | ORAL_TABLET | Freq: Three times a day (TID) | ORAL | 0 refills | Status: DC
Start: 2021-07-11 — End: 2022-03-10
  Filled 2021-07-11 (×2): qty 21, 7d supply, fill #0

## 2021-07-11 MED ORDER — INSULIN PEN NEEDLE 32G X 4 MM MISC
0 refills | Status: AC
  Filled 2021-07-11: qty 100, 25d supply, fill #0

## 2021-07-11 MED ORDER — LORAZEPAM 0.5 MG PO TABS
0.5000 mg | ORAL_TABLET | Freq: Every day | ORAL | 0 refills | Status: DC
Start: 2021-07-11 — End: 2021-07-12

## 2021-07-11 MED ORDER — ACCU-CHEK GUIDE VI STRP
ORAL_STRIP | 0 refills | Status: AC
Start: 2021-07-11 — End: ?
  Filled 2021-07-11: qty 100, 25d supply, fill #0

## 2021-07-11 MED ORDER — LINAGLIPTIN 5 MG PO TABS
5.0000 mg | ORAL_TABLET | Freq: Every day | ORAL | 0 refills | Status: DC
  Filled 2021-07-11: qty 7, 7d supply, fill #0

## 2021-07-11 MED ORDER — BENZTROPINE MESYLATE 0.5 MG PO TABS
0.5000 mg | ORAL_TABLET | Freq: Two times a day (BID) | ORAL | 3 refills | Status: DC
Start: 2021-07-11 — End: 2021-07-11
  Filled 2021-07-11: qty 60, 30d supply, fill #0

## 2021-07-11 MED ORDER — INSULIN GLARGINE 100 UNIT/ML SOLOSTAR PEN: 15.0000 [IU] | PEN_INJECTOR | Freq: Every day | SUBCUTANEOUS | 3 refills | Status: DC

## 2021-07-11 MED ORDER — MIDODRINE HCL 10 MG PO TABS
10.0000 mg | ORAL_TABLET | Freq: Three times a day (TID) | ORAL | 3 refills | Status: DC
Start: 2021-07-11 — End: 2021-07-12

## 2021-07-11 MED ORDER — INSULIN GLARGINE 100 UNIT/ML SOLOSTAR PEN
15.0000 [IU] | PEN_INJECTOR | Freq: Every day | SUBCUTANEOUS | 0 refills | Status: DC
Start: 2021-07-12 — End: 2021-07-11
  Filled 2021-07-11: qty 3, 20d supply, fill #0

## 2021-07-11 MED ORDER — GABAPENTIN 100 MG PO CAPS
100.0000 mg | ORAL_CAPSULE | Freq: Two times a day (BID) | ORAL | 3 refills | Status: DC
Start: 2021-07-11 — End: 2021-07-12

## 2021-07-11 MED ORDER — GLIPIZIDE 5 MG PO TABS
5.0000 mg | ORAL_TABLET | Freq: Every day | ORAL | 0 refills | Status: DC
  Filled 2021-07-11 (×2): qty 7, 7d supply, fill #0

## 2021-07-11 MED ORDER — PALIPERIDONE ER 3 MG PO TB24
3.0000 mg | ORAL_TABLET | Freq: Every day | ORAL | 3 refills | Status: DC
Start: 2021-07-11 — End: 2022-03-23

## 2021-07-11 MED ORDER — BENZTROPINE MESYLATE 0.5 MG PO TABS
0.5000 mg | ORAL_TABLET | Freq: Two times a day (BID) | ORAL | 0 refills | Status: DC
  Filled 2021-07-11 (×2): qty 7, 4d supply, fill #0

## 2021-07-11 MED ORDER — ESCITALOPRAM OXALATE 10 MG PO TABS
10.0000 mg | ORAL_TABLET | Freq: Every day | ORAL | 3 refills | Status: DC
Start: 2021-07-12 — End: 2021-07-11
  Filled 2021-07-11: qty 30, 30d supply, fill #0

## 2021-07-11 MED ORDER — LINAGLIPTIN 5 MG PO TABS: 5.0000 mg | ORAL_TABLET | Freq: Every day | ORAL | 3 refills | Status: DC

## 2021-07-11 MED ORDER — INSULIN ASPART 100 UNIT/ML FLEXPEN
0.0000 [IU] | PEN_INJECTOR | Freq: Three times a day (TID) | SUBCUTANEOUS | 0 refills | Status: DC
  Filled 2021-07-11: qty 3, 16d supply, fill #0

## 2021-07-11 MED ORDER — PALIPERIDONE ER 3 MG PO TB24
3.0000 mg | ORAL_TABLET | Freq: Every day | ORAL | 3 refills | Status: DC
Start: 2021-07-11 — End: 2021-07-11
  Filled 2021-07-11: qty 30, 30d supply, fill #0

## 2021-07-11 MED ORDER — INSULIN ASPART 100 UNIT/ML FLEXPEN
0.0000 [IU] | PEN_INJECTOR | Freq: Three times a day (TID) | SUBCUTANEOUS | 3 refills | Status: DC
  Filled 2021-07-11 – 2021-07-12 (×2): qty 3, 16d supply, fill #0

## 2021-07-11 MED ORDER — ESCITALOPRAM OXALATE 10 MG PO TABS: 10.0000 mg | ORAL_TABLET | Freq: Every day | ORAL | 3 refills | Status: DC

## 2021-07-11 MED ORDER — INSULIN GLARGINE 100 UNIT/ML SOLOSTAR PEN
15.0000 [IU] | PEN_INJECTOR | Freq: Every day | SUBCUTANEOUS | 3 refills | Status: DC
Start: 2021-07-12 — End: 2022-03-10
  Filled 2021-07-11 – 2021-07-12 (×2): qty 3, 20d supply, fill #0

## 2021-07-11 MED ORDER — GABAPENTIN 100 MG PO CAPS
100.0000 mg | ORAL_CAPSULE | Freq: Two times a day (BID) | ORAL | 0 refills | Status: AC
Start: 2021-07-11 — End: ?
  Filled 2021-07-11 (×2): qty 14, 7d supply, fill #0

## 2021-07-11 MED ORDER — GABAPENTIN 100 MG PO CAPS
100.0000 mg | ORAL_CAPSULE | Freq: Two times a day (BID) | ORAL | 3 refills | Status: DC
  Filled 2021-07-11: qty 60, 30d supply, fill #0

## 2021-07-11 MED ORDER — ACETAMINOPHEN 325 MG PO TABS
650.0000 mg | ORAL_TABLET | Freq: Four times a day (QID) | ORAL | 0 refills | Status: AC | PRN
Start: 2021-07-11 — End: ?
  Filled 2021-07-11 (×2): qty 7, 1d supply, fill #0

## 2021-07-11 MED ORDER — GLIPIZIDE 5 MG PO TABS: 5.0000 mg | ORAL_TABLET | Freq: Every day | ORAL | 3 refills | Status: DC

## 2021-07-11 MED ORDER — ESCITALOPRAM OXALATE 10 MG PO TABS
10.0000 mg | ORAL_TABLET | Freq: Every day | ORAL | 0 refills | Status: AC
  Filled 2021-07-11 (×2): qty 7, 7d supply, fill #0

## 2021-07-11 MED ORDER — PALIPERIDONE ER 3 MG PO TB24
3.0000 mg | ORAL_TABLET | Freq: Every day | ORAL | 0 refills | Status: DC
  Filled 2021-07-11: qty 7, 7d supply, fill #0

## 2021-07-11 MED ORDER — LORAZEPAM 0.5 MG PO TABS
0.5000 mg | ORAL_TABLET | Freq: Every day | ORAL | 0 refills | Status: DC
  Filled 2021-07-11 (×2): qty 7, 7d supply, fill #0

## 2021-07-11 MED ORDER — BLOOD GLUCOSE MONITOR SYSTEM W/DEVICE KIT
PACK | 0 refills | Status: AC
  Filled 2021-07-11: qty 1, 30d supply, fill #0

## 2021-07-11 NOTE — Progress Notes (Signed)
?Progress Note ?  ?  ?PatientMarguise [name] [id number] DOB: 11/09/1985 DOA: 03/04/2021     54 ?DOS: the patient was seen and examined on 06/30/2021 ?Needs interpreter/speaks [name] ?  ?Brief hospital course: ?Garrett [name] is a 36 y.o. male with medical history significant of severe depression with psychosis, T2DM and orthostatic hypotension who had been holding in ED for about 2 months time waiting on placement. He was being cared for by members in the community, but they have returned home and he no longer has care. He has been boarding in ED with some hypoglycemic episodes, diarrhea (resolved after metformin adjusted) and some orthostatic hypotension that continues to persist despite stopping his antihypertensive medication as well as addition of SSRI/mood stabilizing drugs.  While in the ER he also had an isolated episode of hyperglycemia with serum glucose 503 with a normal anion gap.  Not consistent with DKA.  Labs were checked and he was noted to have significantly elevated LFTs and with concomitant orthostasis he was referred for admission.  Currently, social work continues to work on placement.  He continues to have significant orthostasis that is limiting his ability to ambulate. ?  ?  ?  ?Assessment and Plan: ?Controlled type 2 diabetes mellitus without complication, with long-term current use of insulin ([place]) ?Hga1c of 5.9 04/2021 ?HOME MEDS: Lantus 40 units daily and Metformin XR 500 mg daily ?Dc'd metformin 2/2 diarrhea.   ?CBGs well controlled on Tradjenta, Glucotrol and Semglee ?  ?  ?Peripheral neuropathy ?Patient reporting pain in the bottom of both feet but assessment limited due to lack of translation services equipment availability ?Given his longstanding history of diabetes I started low-dose Neurontin 100 mg BID for suspected neuropathic pain ?  ?Chronic Hepatitis B ?GI consulted-they suspected chronic Hep B with worsening of LFTs in the setting of ischemic injury during orthostasis vs. Drug induced  liver injury (risperdal) ?After DC needs referral to ID for management of chronic hep B ?AFP tumor marker slightly elevated at 10 c/w acute exacerbation and hepatitis process -CT abdomen without any solid liver masses ?GI recommends outpatient follow-up at [facility] hepatology clinic vs with ID here in [place].  ?-Although may be a candidate for hepatitis B treatment would need to be initiated in the outpatient setting with subsequent insurance authorization to avoid interruption in treatment.  Also has severe depression which may influence decision to initiate hepatitis B medical therapy. ?  ?  ?Constipation (resolved) ?Resolved ?Continue Florastor ?  ?Orthostatic hypotension likely 2/2 autonomic dysfunction ?No further episodes greater than 4 weeks ?Has had episodic orthostasis while in ED during the past 2 months and received intermittent IV fluids ?Cortisol low at 3.1 but cosyntropin stim test with cortisol 11 which ruled out adrenal insufficiency ?Continue TED hose and midodrine ?  ?MDD (major depressive disorder), recurrent, severe, with psychosis ([place]) ?Appreciate psychiatry assistance ?Stable on lexapro and cogentin ?Risperidone discontinued 2/2 elevated LFTs and Invega initiated by the psychiatric team ?Social isolation due to language barrier remains a significant problem and is likely influencing his depression ?  ?Anemia ?Iron panel: Ferritin 696.  B12 in December 2021: 750 and folate 16.5. ?Hemoglobin stable in the 12 g range (3/11).  Outpatient follow-up ?  ?Acute urinary retention-resolved as of 06/01/2021 ?Resolved ?  ?Physical deconditioning ?Resolved.  Able to ambulate well.  Primarily limited by deconditioning preference to stay in the bed most days ?A physical therapy consult is indicated based on the patient?s mobility assessment. ?  ?  ?Mobility Assessment (  last 72 hours)   ?  ?  Mobility Assessment   ?  ?  [place] Name 05/30/21 2000 05/29/21 1925 05/29/21 0800 05/28/21 1955   ?   ?  Does patient  have an order for bedrest or is patient medically unstable No - Continue assessment No - Continue assessment No - Continue assessment No - Continue assessment    ?  What is the highest level of mobility based on the progressive mobility assessment? Level 6 (Walks independently in room and hall) - Balance while walking in room without assist - Complete Level 6 (Walks independently in room and hall) - Balance while walking in room without assist - Complete Level 6 (Walks independently in room and hall) - Balance while walking in room without assist - Complete Level 6 (Walks independently in room and hall) - Balance while walking in room without assist - Complete    ?  Is the above level different from baseline mobility prior to current illness? No - Consider discontinuing PT/OT No - Consider discontinuing PT/OT No - Consider discontinuing PT/OT No - Consider discontinuing PT/OT    ?  ?   ?  ?  ?   ?  ?  ?  ?Low back pain ?Controlled on prn Robaxin and Tylenol ?CT of pelvis and lumbar spine unremarkable and remained stable with no evidence of nerve root encroachment ?Continue to encourage patient to get out of bed and mobilize to minimize back pain. ?  ?Thrombocytopenia-resolved as of 06/01/2021 ?Platelets stable and > 100,000 ?  ?Elevated LFTs ?See Hepatitis B notation ? ?Foot fungus ?Cont Micatin powder every shift ?  ?  ?  ?  ?Subjective:  ?In bed-just awakened. No complaints. Had not eaten break fast yet ?  ?Physical Exam: ?      ?Vitals:  ?  06/29/21 1540 06/29/21 1956 06/30/21 0451 06/30/21 0824  ?BP: 119/85 119/86 98/70 105/72  ?Pulse: 92 92 89 91  ?Resp: 17 19 20 17   ?Temp: 97.7 ?F (36.5 ?C) 97.8 ?F (36.6 ?C) 97.9 ?F (36.6 ?C) 98 ?F (36.7 ?C)  ?TempSrc: Oral Oral Oral    ?SpO2: 100% 99% 98% 99%  ?Weight:          ?Height:          ?  ?General: Calm, NAD ?Respiratory: Stable on room air, anterior lung sounds clear to auscultation, respiratory effort normal ?Cardiovascular: Normotensive, S1 S2, regular pulse.   ?Gastrointestinal: Abdomen is nondistended, soft and nontender.  Positive bowel sounds noted.  LBM 4/7 ?Neurological: Cranial nerves II through XII are grossly intact, patient able to move all extremities x4 with strength 5/5.  Sensation is intact ?Psychiatry: Judgement and insight intact.  Mood & affect flat  ?  ?Data Reviewed: ?There are no new results to review at this time. ?  ? DVT Prophylaxis   ?Place ted hose  ? ?  ?  ?Family Communication:  ?No family available noting they have all returned to [place] ?  ?Disposition: ?Remains inpatient appropriate because: Unsafe discharge plan-deconditioned with ambulation concerns primarily related to recurrent orthostasis-also uncontrolled diabetes at presentation; severe depression along with language barrier contributing to patient's ability for self-care-care be recommends short-term SNF for rehabilitation; also patient does not have a way to pay for SNF or rehab ?Please review any new documentation from CM/LCSW ?  ?  ?Planned Discharge Destination:  ?Family care home/Rest home or ALF ?  ?  ?COVID vaccination status:  ?Unknown ?  ?Consultants: ?Psychiatry ?Gastroenterology ?Procedures: ?  Echocardiogram ?Antibiotics: ?None ?  ?  ?  ?Time spent: 15 minutes ?  ?Author: ?Erin Hearing, NP ?06/30/2021 8:30 AM ?

## 2021-07-11 NOTE — Progress Notes (Signed)
Mobility Specialist Progress Note: ? ? 07/11/21 1116  ?Mobility  ?Activity Ambulated with assistance in hallway  ?Level of Assistance Independent  ?Assistive Device None  ?Distance Ambulated (ft) 570 ft  ?Activity Response Tolerated well  ?$Mobility charge 1 Mobility  ? ?Pt received in be willing to participate in mobility. Complaints of pain in both of his feet. Left in bed with call bell in reach and all needs met.  ? ?Andriana Casa ?Mobility Specialist ?Primary Phone [phone] ? ?

## 2021-07-11 NOTE — TOC Progression Note (Signed)
Transition of Care (TOC) - Progression Note  ? ? ?Patient Details  ?Name: Garrett [name] ?MRN: [id number] ?Date of Birth: [birth year] ? ?Transition of Care (TOC) CM/SW Contact  ?Curlene Labrum, RN ?Phone Number: ?07/11/2021, 1:00 PM ? ?Clinical Narrative:    ?CM spoke with Donny Pique, CM with [facility] and they are able to admit the patient to the facility tomorrow.  The facility is unable to provide transportation to the facility tomorrow, so [facility] transportation will be set up to transport the patient to the facility - located at [facility], [place], [place] was provided to the facility to provide for payment for care until the Disability is approved for the patient. ? ?Medications will be provided through [facility] for 7 days and a glucometer with be sent with the patient as well. ? ?Paper prescriptions for discharge medications will be sent to [facility], manager at the facility so that she can provide medications through [place]. ? ?CM and MSW with DTP Team will continue to follow the patient for discharge planning. ? ? ?Expected Discharge Plan: Assisted Living ?Barriers to Discharge: No Barriers Identified ? ?Expected Discharge Plan and Services ?Expected Discharge Plan: Assisted Living ?In-house Referral: Development worker, community ([facility] disability pending) ?Discharge Planning Services: CM Consult ?  ?Living arrangements for the past 2 months: [place] ?                ?  ?  ?  ?  ?  ?  ?  ?  ?  ?  ? ? ?Social Determinants of Health (SDOH) Interventions ?  ? ?Readmission Risk Interventions ?   ? View : No data to display.  ?  ?  ?  ? ? ?

## 2021-07-12 ENCOUNTER — Other Ambulatory Visit (HOSPITAL_COMMUNITY): Payer: Self-pay

## 2021-07-12 DIAGNOSIS — G629 Polyneuropathy, unspecified: Secondary | ICD-10-CM | POA: Diagnosis not present

## 2021-07-12 DIAGNOSIS — Z794 Long term (current) use of insulin: Secondary | ICD-10-CM | POA: Diagnosis not present

## 2021-07-12 DIAGNOSIS — F333 Major depressive disorder, recurrent, severe with psychotic symptoms: Secondary | ICD-10-CM | POA: Diagnosis not present

## 2021-07-12 DIAGNOSIS — E119 Type 2 diabetes mellitus without complications: Secondary | ICD-10-CM | POA: Diagnosis not present

## 2021-07-12 LAB — GLUCOSE, CAPILLARY
Glucose-Capillary: 112 mg/dL — ABNORMAL HIGH (ref 70–99)
Glucose-Capillary: 106 mg/dL — ABNORMAL HIGH (ref 70–99)

## 2021-07-12 NOTE — Progress Notes (Signed)
TOC meds from pharmacy at pt bedside. [employer] Called to arrange 1300 pick up time. RN call-back number given to cab company.  ?

## 2021-07-12 NOTE — TOC Transition Note (Signed)
Transition of Care (TOC) - CM/SW Discharge Note ? ? ?Patient Details  ?Name: Garrett [name] ?MRN: [id number] ?Date of Birth: 19-Jan-1986 ? ?Transition of Care (TOC) CM/SW Contact:  ?Janae Bridgeman, RN ?Phone Number: ?07/12/2021, 9:27 AM ? ? ?Clinical Narrative:    ?CM spoke with Phill Myron, Owner of [facility] and the patient will be discharged to the facility by cab today - [employer] at 1 pm.  The facility is aware and will be expecting his arrival.  Cab voucher will be provided and the cost of 79.00 was approved by hospital leadership - Jiles Crocker, [facility] Supervisor. ? ?Bedside Nursing to place call for Taxi transportation - [employer] [phone] and voucher will be provided to cover the cost. ? ?TOC will be providing medications for 7 days and will be send with the patient today - including glucometer and supplies.  Copies of discharge prescriptions were faxed to Anitha, Owner of [facility] facility yesterday and paper copies will be sent in the discharge packet as well so the facility can provide medications through [facility]. ? ?The patient's ID including Social security card and Driver's License will be sent with the patient and [facility] facility is aware that it will be sent in the patient's belongings bag. ? ?Discharge Summary and orders will be faxed to [facility] and also included in the discharge packet sent with the patient. ? ?CM will continue to follow the patient for discharge planning needs. ? ? ?Final next level of care: [facility] - [address], [place], [place]) ?Barriers to Discharge: No Barriers Identified ? ? ?Patient Goals and CMS Choice ?Patient states their goals for this hospitalization and ongoing recovery are:: Patient is agreeable to ALF placement. ?CMS Medicare.gov Compare Post Acute Care list provided to:: Patient Represenative (must comment) (Caregiver contact) ?Choice offered to / list  presented to : [facility] POA / Guardian ? ?Discharge Placement ?  ?           ?  ?  ?  ?  ? ?Discharge Plan and Services ?In-house Referral: [facility] ([facility] disability pending) ?Discharge Planning Services: CM Consult ?           ?  ?  ?  ?  ?  ?  ?  ?  ?  ?  ? ?Social Determinants of Health (SDOH) Interventions ?  ? ? ?Readmission Risk Interventions ?   ? View : No data to display.  ?  ?  ?  ? ? ? ? ? ?

## 2021-07-12 NOTE — Discharge Summary (Signed)
Physician Discharge Summary  ?Garrett [name] EAV:[id number] DOB: March 03, 1986 DOA: 03/04/2021 ? ?PCP: Garrett [name], Enobong, MD ? ?Admit date: 03/04/2021 ?Discharge date: 07/12/2021 ? ?Time spent: 35 minutes ? ?Recommendations for Outpatient Follow-up:  ?1.   Patient will need to follow-up with his primary care physician Dr. Odette HornsEnobong with the [facility] contact number [phone].  Please schedule an appointment as soon as possible after discharge ?2. He will [facility] to [facility] [place] [place] ?3.  Patient has chronic hepatitis B and was evaluated by the gastroenterology team during this hospitalization.  They recommend follow-up at either [facility] or ID clinic in [place].  Number for the ID clinic in [place] if [phone] ?4'  Please continue to check glucose readings before meals and at bedtime.  Provide sliding scale insulin as needed ?5.   Please check electrolyte panel, anemia panel and CBC in 1 month after arrival.  Patient will need to have a follow-up hemoglobin A1c in 3 months ? ? ?Discharge Diagnoses:  ?Active Problems: ?  Controlled type 2 diabetes mellitus without complication, with long-term current use of insulin (HCC) ?  Peripheral neuropathy ?  Chronic Hepatitis B ?  Constipation ?  MDD (major depressive disorder), recurrent, severe, with psychosis (HCC) ?  Anemia ?  Physical deconditioning ?  Low back pain ? ? ?Discharge Condition:  ?Stable ? ?Diet recommendation:  ?Carbohydrate modified ? ?Filed Weights  ? 05/18/21 0811  ?Weight: 49.3 kg  ? ? ?History of present illness:  ?36 y.o. male with medical history significant of severe depression with psychosis, T2DM and orthostatic hypotension who had been holding in ED for about 2 months time waiting on placement. He was being cared for by members in the community, but they have returned home and he no longer has care. He has been boarding in ED with some hypoglycemic  episodes, diarrhea (resolved after metformin adjusted) and some orthostatic hypotension that continues to persist despite stopping his antihypertensive medication as well as addition of SSRI/mood stabilizing drugs.  While in the ER he also had an isolated episode of hyperglycemia with serum glucose 503 with a normal anion gap.  Not consistent with DKA.  Labs were checked and he was noted to have significantly elevated LFTs and with concomitant orthostasis he was referred for admission.  Currently, social work continues to work on placement.  He continues to have significant orthostasis that is limiting his ability to ambulate. ? ?Hospital Course:  ?Controlled type 2 diabetes mellitus without complication, with long-term current use of insulin (HCC) ?Hga1c of 5.9 04/2021 ?HOME MEDS: Lantus 40 units daily and Metformin XR 500 mg daily ?Dc'd metformin 2/2 diarrhea.   ?CBGs well controlled on Tradjenta, Glucotrol and Semglee ?  ?  ?Peripheral neuropathy ?Patient reporting pain in the bottom of both feet but assessment limited due to lack of translation services equipment availability ?Given his longstanding history of diabetes I started low-dose Neurontin 100 mg BID for suspected neuropathic pain ?  ?Chronic Hepatitis B ?GI consulted-they suspected chronic Hep B with worsening of LFTs in the setting of ischemic injury during orthostasis vs. Drug induced liver injury (risperdal) ?After DC needs referral to ID for management of chronic hep B ?AFP tumor marker slightly elevated at 10 c/w acute exacerbation and hepatitis process -CT abdomen without any solid liver masses ?GI [facility] vs with [place].  ?-Although may be a candidate for hepatitis B treatment  would need to be initiated in the outpatient setting with subsequent insurance authorization to avoid interruption in treatment.  Also has severe depression which may influence decision to initiate hepatitis  B medical therapy. ?  ?  ?Constipation (resolved) ?Resolved ?Continue Florastor ?  ?Orthostatic hypotension likely 2/2 autonomic dysfunction ?No further episodes greater than 4 weeks ?Has had episodic orthostasis while in ED during the past 2 months and received intermittent IV fluids ?Cortisol low at 3.1 but cosyntropin stim test with cortisol 11 which ruled out adrenal insufficiency ?Continue TED hose and midodrine ?  ?MDD (major depressive disorder), recurrent, severe, with psychosis (HCC) ?Appreciate psychiatry assistance ?Stable on lexapro and cogentin ?Risperidone discontinued 2/2 elevated LFTs and Invega initiated by the psychiatric team ?Social isolation due to language barrier remains a significant problem and is likely influencing his depression ?  ?Anemia ?Iron panel: Ferritin 696.  B12 in December 2021: 750 and folate 16.5. ?Hemoglobin stable in the 12 g range (3/11).  Outpatient follow-up ?  ?Acute urinary retention-resolved as of 06/01/2021 ?Resolved ?  ?Physical deconditioning ?Resolved.  Able to ambulate well.  Primarily limited by deconditioning preference to stay in the bed most days ?A physical therapy consult is indicated based on the patient?s mobility assessment. ?  ?  ?Mobility Assessment (last 72 hours)   ?  ?  Mobility Assessment   ?  ?  Row Name 05/30/21 2000 05/29/21 1925 05/29/21 0800 05/28/21 1955   ?   ?  Does patient have an order for bedrest or is patient medically unstable No - Continue assessment No - Continue assessment No - Continue assessment No - Continue assessment    ?  What is the highest level of mobility based on the progressive mobility assessment? Level 6 (Walks independently in room and hall) - Balance while walking in room without assist - Complete Level 6 (Walks independently in room and hall) - Balance while walking in room without assist - Complete Level 6 (Walks independently in room and hall) - Balance while walking in room without assist - Complete Level 6 (Walks  independently in room and hall) - Balance while walking in room without assist - Complete    ?  Is the above level different from baseline mobility prior to current illness? No - Consider discontinuing PT/OT No - Consider discontinuing PT/OT No - Consider discontinuing PT/OT No - Consider discontinuing PT/OT    ?  ?   ?  ?  ?   ?  ?Low back pain ?Controlled on Tylenol ?CT of pelvis and lumbar spine unremarkable and remained stable with no evidence of nerve root encroachment ?Continue to encourage patient to get out of bed and mobilize to minimize back pain. ?  ?Thrombocytopenia-resolved as of 06/01/2021 ?Platelets stable and > 100,000 ?  ?Elevated LFTs ?See Hepatitis B notation ?  ?Foot fungus ?Cont Micatin powder every shift ?  ? ? ?Procedures: ?Echocardiogram ? ?Consultations: ?Psychiatry ?Gastroenterology ? ?Discharge Exam: ?Vitals:  ? 07/12/21 0405 07/12/21 0805  ?BP: 117/74 96/74  ?Pulse: 90 95  ?Resp: 18 18  ?Temp: 98.2 ?F (36.8 ?C) 98 ?F (36.7 ?C)  ?SpO2: 97% 98%  ? ?General: Calm, NAD ?Respiratory: Stable on room air, anterior lung sounds clear to auscultation, respiratory effort normal ?Cardiovascular: Normotensive, S1 S2, regular pulse.  ?Gastrointestinal: Abdomen is nondistended, soft and nontender.  Positive bowel sounds noted.  LBM 4/7 ?Neurological: Cranial nerves II through XII are grossly intact, patient able to move all extremities x4 with strength 5/5.  Sensation is intact ?Psychiatry: Judgement and insight intact.  Mood & affect flat -requires interpreter for accurate exam ? ? ?Discharge Instructions ? ? ?Discharge Instructions   ? ? Diet Carb Modified   Complete by: As directed ?  ? With snacks 3 times per day and at bedtime  ? Increase activity slowly   Complete by: As directed ?  ? No wound care   Complete by: As directed ?  ? ?  ? ?Allergies as of 07/12/2021   ?No Known Allergies ?  ? ?  ?Medication List  ?  ? ?STOP taking these medications   ? ?ibuprofen 400 MG tablet ?Commonly known as: ADVIL ?   ?metFORMIN 500 MG 24 hr tablet ?Commonly known as: GLUCOPHAGE-XR ?  ?risperiDONE 2 MG tablet ?Commonly known as: RISPERDAL ?  ?tamsulosin 0.4 MG Caps capsule ?Commonly known as: FLOMAX ?  ?thiamine 100 MG

## 2021-07-12 NOTE — Progress Notes (Signed)
Pt transported off unit via WC with allbelongings including AVS, TOC meds, printed prescriptions, DL/ID and SS card.  ?Taxi voucher given to driver. Pt remains stable at baseline. ?

## 2021-07-12 NOTE — Progress Notes (Signed)
DCP to [facility] today. Pt awaiting delivery of TOC meds from pharmacy before cab transport is called.  ?

## 2021-07-12 NOTE — Progress Notes (Signed)
Mobility Specialist Progress Note: ? ? 07/12/21 1116  ?Mobility  ?Activity Ambulated with assistance in hallway  ?Level of Assistance Independent  ?Assistive Device None  ?Distance Ambulated (ft) 1120 ft  ?Activity Response Tolerated well  ?$Mobility charge 1 Mobility  ? ?Pt received in bed willing to participate in mobility. Complaints of "a little" pain in his feet. Left in bed with call bell in reach and all needs met.  ? ?Onda Kattner ?Mobility Specialist ?Primary Phone [phone] ? ?

## 2021-07-13 ENCOUNTER — Telehealth: Payer: Self-pay

## 2021-07-13 NOTE — Telephone Encounter (Signed)
Transition Care Management Unsuccessful Follow-up Telephone Call ? ?Date of discharge and from where:  07/12/2021, [facility] ? ?Attempts:  1st Attempt ? ?Reason for unsuccessful TCM follow-up call:  Unable to leave message - voicemail full # [phone] - Anitha Kurian/Owner - [facility], [place].  ? ? ? ?

## 2021-07-14 ENCOUNTER — Telehealth: Payer: Self-pay

## 2021-07-14 MED ORDER — DEXCOM G6 SENSOR MISC: 1.0000 | Freq: Every day | 11 refills | Status: DC

## 2021-07-14 MED ORDER — DEXCOM G6 RECEIVER DEVI: 1.0000 | Freq: Every day | 0 refills | Status: DC

## 2021-07-14 NOTE — Addendum Note (Signed)
Addended by: Jonah Blue B on: 07/14/2021 10:22 PM ? ? Modules accepted: Orders ? ?

## 2021-07-14 NOTE — Telephone Encounter (Signed)
Transition Care Management Follow-up Telephone Call ? ?Call was completed with Donny Pique- Manager of [place] ?Date of discharge and from where: 07/12/2021, [facility] ?How have you been since you were released from the hospital? She said he is doing okay.  They do not have an interpreter on staff so they are only able to communicate with him through " sign language or actions." ?Any questions or concerns? Yes - she is interested in getting a Dexcom for him  ?She is also concerned about getting an appointment with [facility] to address his Hep B. ?Anitha explained that they have a provider that comes to their facility to follow up with patients but she would like him seen at [facility] as a follow up from the hospitalization.  ? ?Items Reviewed: ?Did the pt receive and understand the discharge instructions provided? Yes  - she said that she has the instructions and discharge summary.  ?Medications obtained and verified? Yes - Anitha said that they have all of his medications and the staff gives him his medications and checks his blood sugars.  ?Other? No  ?Any new allergies since your discharge? No  ?Dietary orders reviewed? Yes - she said that they have a dietician on staff to insure he receives a diabetic diet.  ?Do you have support at home?  He is residing at an ALF ? ?Home Care and Equipment/Supplies: ?Were home health services ordered? no ?If so, what is the name of the agency? N/a  ?Has the agency set up a time to come to the patient's home? not applicable ?Were any new equipment or medical supplies ordered?  Yes: glucometer ?What is the name of the medical supply agency? He received when discharged.  ?Were you able to get the supplies/equipment? yes ?Do you have any questions related to the use of the equipment or supplies? No ? ?Functional Questionnaire: (I = Independent and D = Dependent) ?ADLs: independent ? ?Follow up appointments reviewed: ? ?PCP Hospital f/u appt confirmed?  Yes  Scheduled to see Dr Margarita Rana - 07/25/2021. ?[facility] f/u appt confirmed?  None scheduled yet.   ?Are transportation arrangements needed? No  - Anitha will drive him to the appointment  ?If their condition worsens, is the pt aware to call PCP or go to the Emergency Dept.? Yes ?Was the patient provided with contact information for the PCP's office or ED? Yes ?Was to pt encouraged to call back with questions or concerns? Yes ? ?

## 2021-07-19 NOTE — Telephone Encounter (Signed)
noted 

## 2021-07-25 ENCOUNTER — Ambulatory Visit: Payer: Medicaid Other | Admitting: Family Medicine

## 2021-07-25 ENCOUNTER — Ambulatory Visit: Payer: Self-pay

## 2021-07-25 NOTE — Telephone Encounter (Signed)
Synetta Fail at [facility] states she cannot get pt. To his 0950 appointment today. Asking if he can be worked in later today or tomorrow. No response with FC , no answer on practice phones. Please advise caregiver. Contact number [phone]. ?

## 2021-07-26 NOTE — Telephone Encounter (Signed)
Please reach out to patient and reschedule appointment. ?

## 2021-08-11 NOTE — Telephone Encounter (Signed)
I called and spoke to patient's caregiver, Synetta Fail about re-scheduling his PCP appointment.  She said that she has him set up with a PCP that comes to the home where he is residing, no appointment needed with Dr Alvis Lemmings.  She can be removed as PCP in Epic.   ?Synetta Fail said she just needs to schedule him an appointment with [facility] infectious disease.   ? ?

## 2021-08-16 NOTE — Progress Notes (Signed)
CM spoke with Malvin Johns, own of [facility] and she has requested duplicate copy of the PCS document that was filled out and sent to [facility] when the patient was discharged to the facility.  I was unable to retrieve a copy of the [facility] document and another form was filled out to duplicate that lost form that was previously sent to [employer].  Kurian, CM states that she will follow up with the medical provider, edit the form according to patient's medical needs currently and will forward a copy to [employer] as requested to provide PCS Services at [facility]. ?

## 2021-09-14 ENCOUNTER — Ambulatory Visit: Payer: Medicaid Other | Admitting: Podiatry

## 2021-09-20 ENCOUNTER — Inpatient Hospital Stay: Payer: Medicaid Other | Admitting: Infectious Diseases

## 2021-09-26 ENCOUNTER — Ambulatory Visit (INDEPENDENT_AMBULATORY_CARE_PROVIDER_SITE_OTHER): Payer: Medicaid Other | Admitting: Infectious Diseases

## 2021-09-26 ENCOUNTER — Encounter: Payer: Self-pay | Admitting: Infectious Diseases

## 2021-09-26 ENCOUNTER — Other Ambulatory Visit: Payer: Self-pay

## 2021-09-26 VITALS — BP 93/64 | HR 103 | Temp 97.3°F | Wt 108.0 lb

## 2021-09-26 DIAGNOSIS — R7401 Elevation of levels of liver transaminase levels: Secondary | ICD-10-CM

## 2021-09-26 DIAGNOSIS — B1910 Unspecified viral hepatitis B without hepatic coma: Secondary | ICD-10-CM | POA: Diagnosis present

## 2021-09-26 NOTE — Progress Notes (Signed)
Patient Active Problem List   Diagnosis Date Noted   Constipation 06/29/2021   Peripheral neuropathy 06/28/2021   Generalized weakness    Low back pain 05/24/2021   Major depressive disorder, recurrent episode, moderate with mood-congruent psychotic features ([place])    Chronic Hepatitis B 05/12/2021   Physical deconditioning    Anemia 05/05/2021   Altered mental status    Controlled type 2 diabetes mellitus without complication, with long-term current use of insulin ([place]) 45/85/9292   Acute metabolic encephalopathy 44/62/8638   Elevated LFTs 03/09/2020   Major depressive disorder, recurrent severe without psychotic features ([place]) 06/20/2017   MDD (major depressive disorder), recurrent, severe, with psychosis ([place]) 06/19/2017    Patient's Medications  New Prescriptions   No medications on file  Previous Medications   ACCU-CHEK SOFTCLIX LANCETS LANCETS    Use to test blood sugars up to 4 times daily as directed.   ACETAMINOPHEN (TYLENOL) 325 MG TABLET    Take 2 tablets (650 mg total) by mouth every 6 (six) hours as needed for mild pain or moderate pain.   BENZTROPINE (COGENTIN) 0.5 MG TABLET    Take 1 tablet (0.5 mg total) by mouth 2 (two) times daily.   BLOOD GLUCOSE MONITORING SUPPL (BLOOD GLUCOSE MONITOR SYSTEM) W/DEVICE KIT    Use to test blood sugars up to 4 times daily as directed.   CONTINUOUS BLOOD GLUC RECEIVER (DEXCOM G6 RECEIVER) DEVI    1 Device by Does not apply route daily.   CONTINUOUS BLOOD GLUC SENSOR (DEXCOM G6 SENSOR) MISC    1 packet by Does not apply route daily.   ESCITALOPRAM (LEXAPRO) 10 MG TABLET    Take 1 tablet (10 mg total) by mouth daily.   FEEDING SUPPLEMENT, GLUCERNA SHAKE, (GLUCERNA SHAKE) LIQD    Take 237 mLs by mouth 3 (three) times daily between meals.   GABAPENTIN (NEURONTIN) 100 MG CAPSULE    Take 1 capsule (100 mg total) by mouth 2 (two) times daily.   GLIPIZIDE (GLUCOTROL) 5 MG TABLET    Take 1 tablet (5 mg total) by mouth daily before  breakfast.   GLUCOSE BLOOD (ACCU-CHEK GUIDE) TEST STRIP    Use to check blood sugars up to 4 times daily as directed.   INSULIN ASPART (NOVOLOG) 100 UNIT/ML FLEXPEN    Inject 0-6 Units into the skin 3 (three) times daily with meals.   INSULIN ASPART (NOVOLOG) 100 UNIT/ML FLEXPEN    Inject 0-6 Units into the skin 3 (three) times daily with meals.   INSULIN GLARGINE (LANTUS) 100 UNIT/ML SOLOSTAR PEN    Inject 15 Units into the skin daily.   INSULIN GLARGINE (LANTUS) 100 UNIT/ML SOLOSTAR PEN    Inject 15 Units into the skin daily.   INSULIN PEN NEEDLE 32G X 4 MM MISC    Use to inject insulin up to 4 times daily.   LINAGLIPTIN (TRADJENTA) 5 MG TABS TABLET    Take 1 tablet (5 mg total) by mouth daily.   LORAZEPAM (ATIVAN) 0.5 MG TABLET    Take 1 tablet (0.5 mg total) by mouth at bedtime.   MICONAZOLE NITRATE (MICATIN) POWD    Apply 1 application. topically 3 (three) times daily.   MIDODRINE (PROAMATINE) 10 MG TABLET    Take 1 tablet (10 mg total) by mouth 3 (three) times daily with meals.   PALIPERIDONE (INVEGA) 3 MG 24 HR TABLET    Take 1 tablet (3 mg total) by mouth at bedtime.  Modified Medications  No medications on file  Discontinued Medications   No medications on file    Subjective: 36 Y O male from [place] with PMH of hepatitis B, Depression, DM, assisted facility resident who is referred for evaluation and management of chronic hepatitis B. Recently admitted 2/1-4/11 for fluctuating BG levels, diarrhea, hypotension, abnormal LFTs thought to be 2/2 Hep B with ischemic injury during orthostasis vs DILI. Seen by GI and recommended fu with ID vs Hepatology. There was also concerns for initiating tx 2/2 severe depression/housing issues and continuity of care without interruption given no family/social support.   Spoke with patient with the help of video interpreter ( karen). He appears very less engaged on conversation and mostly answering in yes/no. He is unaware about his Hepatitis B  diagnosis and unsure about how he got it. He is originally from [place] and unsure when he moved to [place]. Denies ever having sexual encounter with Hep B + partners, blood transfusion, tattoos. Denies Hep B in family members. Denies h/o liver disease in the family.   He currently lives in an assisted living facility [facility] ).  He cannot tell me since when he is staying in the ALF or how long he will stay there. Denies having any family nearby. He has friends but dont have their contact. Denies smoking, alcohol and IVDU but has used alcohol in the past.   Denies fevers, chills and sweats Denies nausea, vomiting, abdominal pain and diarrhea  Denies rashes, joint pain or GU symptoms  Denies any SI and HI.   Review of Systems: difficult to obtain as patient very less engaged   Past Medical History:  Diagnosis Date   Depression    Diabetes mellitus ([place])    No past surgical history on file.  Social History   Tobacco Use   Smoking status: Never   Smokeless tobacco: Never    No family history on file.  No Known Allergies  Health Maintenance  Topic Date Due   COVID-19 Vaccine (1) Never done   FOOT EXAM  Never done   OPHTHALMOLOGY EXAM  Never done   URINE MICROALBUMIN  Never done   TETANUS/TDAP  Never done   INFLUENZA VACCINE  11/01/2021   HEMOGLOBIN A1C  11/11/2021   Hepatitis C Screening  Completed   HIV Screening  Completed   HPV VACCINES  Aged Out    Objective: BP 93/64   Pulse (!) 103   Temp (!) 97.3 F (36.3 C) (Temporal)   Wt 108 lb (49 kg)   BMI 19.75 kg/m   Physical Exam Constitutional:      Appearance: Normal appearance.  HENT:     Head: Normocephalic and atraumatic.      Mouth: Mucous membranes are moist.  Eyes:    Conjunctiva/sclera: Conjunctivae normal.     Pupils:   Cardiovascular:     Rate and Rhythm: Normal rate and regular rhythm.     Heart sounds:   Pulmonary:     Effort: Pulmonary effort is normal.     Breath sounds: Normal breath sounds.    Abdominal:     General: Non distended     Palpations: soft.   Musculoskeletal:        General: Normal range of motion.   Skin:    General: Skin is warm and dry.     Comments:  Neurological:     General: grossly non focal     Mental Status: awake, alert and oriented to person, place, and time.  Psychiatric:        Mood and Affect: Mood normal.   Lab Results Lab Results  Component Value Date   WBC 7.7 07/01/2021   HGB 12.3 (L) 07/01/2021   HCT 36.5 (L) 07/01/2021   MCV 92.2 07/01/2021   PLT 165 07/01/2021    Lab Results  Component Value Date   CREATININE 0.88 07/01/2021   BUN 17 07/01/2021   NA 138 07/01/2021   K 3.7 07/01/2021   CL 102 07/01/2021   CO2 29 07/01/2021    Lab Results  Component Value Date   ALT 56 (H) 07/01/2021   AST 33 07/01/2021   ALKPHOS 51 07/01/2021   BILITOT 0.6 07/01/2021    Lab Results  Component Value Date   CHOL 199 06/22/2017   HDL 43 06/22/2017   LDLCALC 135 (H) 06/22/2017   TRIG 107 06/22/2017   CHOLHDL 4.6 06/22/2017   No results found for: "LABRPR", "RPRTITER" No results found for: "HIV1RNAQUANT", "HIV1RNAVL", "CD4TABS"   Problem List Items Addressed This Visit       Digestive   Chronic Hepatitis B - Primary   Relevant Orders   Hepatitis B core antibody, IgM   Hepatitis B core antibody, total   Hepatitis B DNA, ultraquantitative, PCR   Hepatitis B e antibody   Hepatitis B e antigen   Hepatitis B surface antibody,qualitative   Hepatitis B surface antigen   Hepatitis C antibody   Hepatitis delta antibody   Liver Fibrosis, FibroTest-ActiTest   US ABDOMEN COMPLETE W/ELASTOGRAPHY   HIV Antibody (routine testing w rflx)   CBC   Hepatic function panel   Hepatitis A antibody, total     Other   Transaminitis   Assessment/Plan # Chronic Hepatitis B  # Transaminitis # Depression # IDDM   It seems he may need treatment from labs done from February 2023. But I have concerns regarding initiation of Hepatitis B  treatment given h/o severe depression with no social and family support and patient's  ability to continue treatment for a prolonged period without interruption independently. He is currently in an ALF but unsure  how long will he be staying there. I do not think he has proper insight on his chronic hepatitis B and need to take prolonged treatment ( if indicated)  Labs today US abdomen  Hepatitis A serology  HIV serology  Fu to be made after labs and imaging are done  I have personally spent 66  minutes involved in face-to-face and non-face-to-face activities for this patient on the day of the visit. Professional time spent includes the following activities: Preparing to see the patient (review of tests), Obtaining and/or reviewing separately obtained history (admission/discharge record), Performing a medically appropriate examination and/or evaluation , Ordering medications/tests/procedures, referring and communicating with other health care professionals, Documenting clinical information in the EMR, Independently interpreting results (not separately reported), Communicating results to the patient/family/caregiver, Counseling and educating the patient/family/caregiver and Care coordination (not separately reported).   Wilber Oliphant, [facility] 09/26/2021, 8:01 AM

## 2021-10-06 ENCOUNTER — Ambulatory Visit (HOSPITAL_COMMUNITY): Admission: RE | Admit: 2021-10-06 | Payer: Medicaid Other | Source: Ambulatory Visit

## 2021-10-21 ENCOUNTER — Ambulatory Visit (INDEPENDENT_AMBULATORY_CARE_PROVIDER_SITE_OTHER): Payer: Medicaid Other | Admitting: Podiatry

## 2021-10-21 DIAGNOSIS — Z91199 Patient's noncompliance with other medical treatment and regimen due to unspecified reason: Secondary | ICD-10-CM

## 2021-10-21 NOTE — Progress Notes (Signed)
No show

## 2021-10-28 ENCOUNTER — Ambulatory Visit (HOSPITAL_COMMUNITY)
Admission: RE | Admit: 2021-10-28 | Discharge: 2021-10-28 | Disposition: A | Discharge status: Home / Self Care | Payer: Medicaid Other | Source: Ambulatory Visit | Attending: Infectious Diseases | Admitting: Infectious Diseases

## 2021-10-28 ENCOUNTER — Telehealth: Payer: Self-pay

## 2021-10-28 DIAGNOSIS — B1910 Unspecified viral hepatitis B without hepatic coma: Secondary | ICD-10-CM | POA: Diagnosis present

## 2021-10-28 NOTE — Telephone Encounter (Addendum)
Patient currently at a group home. Patient's caregiver Synetta Fail [phone]) advised of unremarkable [place] results. Synetta Fail also advised that patient is needing his labs drawn. Patient scheduled for a lab appointment on 10/31/21.  Garrett [name] T Pricilla Loveless

## 2021-10-28 NOTE — Addendum Note (Signed)
Addended by: Tressa Busman T on: 10/28/2021 03:59 PM   Modules accepted: Orders

## 2021-10-28 NOTE — Progress Notes (Signed)
US abdomen unremarkable Labs ordered at prior visit were not collected, needs to be drawn.

## 2021-10-28 NOTE — Telephone Encounter (Signed)
-----   Message from Odette Fraction, MD sent at 10/28/2021  2:50 PM EDT ----- US abdomen unremarkable Labs ordered at prior visit were not collected, needs to be drawn.

## 2021-10-31 ENCOUNTER — Other Ambulatory Visit: Payer: Medicaid Other

## 2022-01-14 DIAGNOSIS — B181 Chronic viral hepatitis B without delta-agent: Secondary | ICD-10-CM

## 2022-01-14 DIAGNOSIS — E119 Type 2 diabetes mellitus without complications: Secondary | ICD-10-CM | POA: Insufficient documentation

## 2022-01-14 HISTORY — DX: Chronic viral hepatitis B without delta-agent: B18.1

## 2022-01-18 DIAGNOSIS — E43 Unspecified severe protein-calorie malnutrition: Secondary | ICD-10-CM

## 2022-01-18 HISTORY — DX: Unspecified severe protein-calorie malnutrition: E43

## 2022-01-20 DIAGNOSIS — T8143XA Infection following a procedure, organ and space surgical site, initial encounter: Secondary | ICD-10-CM

## 2022-01-20 DIAGNOSIS — K651 Peritoneal abscess: Secondary | ICD-10-CM | POA: Insufficient documentation

## 2022-01-20 HISTORY — DX: Infection following a procedure, organ and space surgical site, initial encounter: T81.43XA

## 2022-01-24 DIAGNOSIS — E871 Hypo-osmolality and hyponatremia: Secondary | ICD-10-CM

## 2022-01-24 DIAGNOSIS — E8809 Other disorders of plasma-protein metabolism, not elsewhere classified: Secondary | ICD-10-CM

## 2022-01-24 HISTORY — DX: Hypo-osmolality and hyponatremia: E87.1

## 2022-01-24 HISTORY — DX: Other disorders of plasma-protein metabolism, not elsewhere classified: E88.09

## 2022-01-26 DIAGNOSIS — I959 Hypotension, unspecified: Secondary | ICD-10-CM | POA: Insufficient documentation

## 2022-01-26 HISTORY — DX: Hypotension, unspecified: I95.9

## 2022-01-30 ENCOUNTER — Telehealth: Payer: Self-pay

## 2022-02-08 ENCOUNTER — Encounter: Payer: Self-pay | Admitting: Urology

## 2022-02-08 ENCOUNTER — Ambulatory Visit: Payer: Medicaid Other | Admitting: Urology

## 2022-02-08 NOTE — Progress Notes (Deleted)
   Assessment: 1. Urinary retention      Plan: I personally reviewed the patient records from his ER visit on 01/30/2022 including provider notes, lab results, and imaging results.  Chief Complaint: No chief complaint on file.   History of Present Illness:  Garrett [name] is a 36 y.o. male who is seen for evaluation of urinary retention.  He was recently admitted to the hospital in October 2023 for this with perforation.  He had a prolonged hospital course.  He underwent exploratory laparotomy on 01/14/2022.  He was discharged on 01/27/2022.  He was seen in the emergency room at [facility] on 01/30/2022 with abdominal pain.  He was found to have urinary retention.  CT imaging showed no hydronephrosis or nephrolithiasis and a markedly distended bladder.  A Foley catheter was placed with return of 600 mL.  The patient lives in a group home. Past Medical History:  Past Medical History:  Diagnosis Date   Depression    Diabetes mellitus (HCC)     Past Surgical History:  Past Surgical History:  Procedure Laterality Date   APPENDECTOMY     EXPLORATORY LAPAROTOMY      Allergies:  No Known Allergies  Family History:  No family history on file.  Social History:  Social History   Tobacco Use   Smoking status: Never   Smokeless tobacco: Never    Review of symptoms:  Constitutional:  Negative for unexplained weight loss, night sweats, fever, chills ENT:  Negative for nose bleeds, sinus pain, painful swallowing CV:  Negative for chest pain, shortness of breath, exercise intolerance, palpitations, loss of consciousness Resp:  Negative for cough, wheezing, shortness of breath GI:  Negative for nausea, vomiting, diarrhea, bloody stools GU:  Positives noted in HPI; otherwise negative for gross hematuria, dysuria, urinary incontinence Neuro:  Negative for seizures, poor balance, limb weakness, slurred speech Psych:  Negative for lack of energy, depression, anxiety Endocrine:   Negative for polydipsia, polyuria, symptoms of hypoglycemia (dizziness, hunger, sweating) Hematologic:  Negative for anemia, purpura, petechia, prolonged or excessive bleeding, use of anticoagulants  Allergic:  Negative for difficulty breathing or choking as a result of exposure to anything; no shellfish allergy; no allergic response (rash/itch) to materials, foods  Physical exam: There were no vitals taken for this visit. GENERAL APPEARANCE:  Well appearing, well developed, well nourished, NAD HEENT: Atraumatic, Normocephalic, oropharynx clear. NECK: Supple without lymphadenopathy or thyromegaly. LUNGS: Clear to auscultation bilaterally. HEART: Regular Rate and Rhythm without murmurs, gallops, or rubs. ABDOMEN: Soft, non-tender, No Masses. EXTREMITIES: Moves all extremities well.  Without clubbing, cyanosis, or edema. NEUROLOGIC:  Alert and oriented x 3, normal gait, CN II-XII grossly intact.  MENTAL STATUS:  Appropriate. BACK:  Non-tender to palpation.  No CVAT SKIN:  Warm, dry and intact.    Results: U/A:  Procedure:  VOIDING TRIAL  A voiding trial was performed in the office today.   Volume of sterile water instilled: *** mL Foley catheter removed intact. Volume voided by patient: *** mL Post-void residual by bladder scan: *** mL Instructed to return to office if has not voided by 4 PM

## 2022-02-09 ENCOUNTER — Encounter: Payer: Self-pay | Admitting: Urology

## 2022-02-09 ENCOUNTER — Ambulatory Visit (INDEPENDENT_AMBULATORY_CARE_PROVIDER_SITE_OTHER): Payer: Medicaid Other | Admitting: Urology

## 2022-02-09 ENCOUNTER — Ambulatory Visit: Payer: Medicaid Other | Admitting: Physician Assistant

## 2022-02-09 VITALS — BP 115/85 | HR 90

## 2022-02-09 DIAGNOSIS — R339 Retention of urine, unspecified: Secondary | ICD-10-CM

## 2022-02-09 DIAGNOSIS — N471 Phimosis: Secondary | ICD-10-CM | POA: Diagnosis not present

## 2022-02-09 MED ORDER — TAMSULOSIN HCL 0.4 MG PO CAPS: 0.4000 mg | ORAL_CAPSULE | Freq: Every day | ORAL | 11 refills | Status: AC

## 2022-02-09 NOTE — Progress Notes (Signed)
Subjective: 1. Urinary retention   2. Phimosis      Consult requested by ER  Translation service used since he speaks [place] and no [place].   Garrett [name] is a 36 yo male with a history of diabetes without neuropathy symptoms.  He was at [facility] for a ruptured appendix in October.   He was back in the ER on 10/30 for abdominal pain and diarrhea and was found on CT to have urinary retention but his CT on  01/25/22 also showed retention.  He still has a foley in place.   He also had penile edema and severe phimosis as well.  He had some difficulty voiding for some time prior to his ER visit.  He had no prior evaluation or treatment for the voiding.     ROS:  Review of Systems  Constitutional:  Negative for chills and fever.  Respiratory:  Negative for shortness of breath.   Cardiovascular:  Negative for chest pain.  Gastrointestinal:  Positive for abdominal pain (at the surgical site) and diarrhea. Negative for constipation, nausea and vomiting.  Neurological:  Negative for tingling and sensory change.  Endo/Heme/Allergies:  Positive for polydipsia.  Psychiatric/Behavioral:  Negative for depression.   All other systems reviewed and are negative.   No Known Allergies  Past Medical History:  Diagnosis Date   Depression    Diabetes mellitus ([place])     Past Surgical History:  Procedure Laterality Date   APPENDECTOMY     EXPLORATORY LAPAROTOMY      Social History   Socioeconomic History   Marital status: Single    Spouse name: Not on file   Number of children: Not on file   Years of education: Not on file   Highest education level: Not on file  Occupational History   Not on file  Tobacco Use   Smoking status: Never   Smokeless tobacco: Never  Substance and Sexual Activity   Alcohol use: Not on file   Drug use: Not on file   Sexual activity: Not on file  Other Topics Concern   Not on file  Social History Narrative   Not on file   Social Determinants of Health    Financial Resource Strain: Not on file  Food Insecurity: Not on file  Transportation Needs: Not on file  Physical Activity: Not on file  Stress: Not on file  Social Connections: Not on file  Intimate Partner Violence: Not on file    No family history on file.  Anti-infectives: Anti-infectives (From admission, onward)    None       Current Outpatient Medications  Medication Sig Dispense Refill   Accu-Chek Softclix Lancets lancets Use to test blood sugars up to 4 times daily as directed. 100 each 0   acetaminophen (TYLENOL) 325 MG tablet Take 2 tablets (650 mg total) by mouth every 6 (six) hours as needed for mild pain or moderate pain. 7 tablet 0   benztropine (COGENTIN) 0.5 MG tablet Take 1 tablet (0.5 mg total) by mouth 2 (two) times daily. 7 tablet 0   Blood Glucose Monitoring Suppl (BLOOD GLUCOSE MONITOR SYSTEM) w/Device KIT Use to test blood sugars up to 4 times daily as directed. 1 kit 0   Continuous Blood Gluc Receiver (DEXCOM G6 RECEIVER) DEVI 1 Device by Does not apply route daily. 1 each 0   Continuous Blood Gluc Sensor (DEXCOM G6 SENSOR) MISC 1 packet by Does not apply route daily. 3 each 11   escitalopram (LEXAPRO) 10  MG tablet Take 1 tablet (10 mg total) by mouth daily. 7 tablet 0   feeding supplement, GLUCERNA SHAKE, (GLUCERNA SHAKE) LIQD Take 237 mLs by mouth 3 (three) times daily between meals. 237 mL 0   gabapentin (NEURONTIN) 100 MG capsule Take 1 capsule (100 mg total) by mouth 2 (two) times daily. 14 capsule 0   glipiZIDE (GLUCOTROL) 5 MG tablet Take 1 tablet (5 mg total) by mouth daily before breakfast. 7 tablet 0   glucose blood (ACCU-CHEK GUIDE) test strip Use to check blood sugars up to 4 times daily as directed. 100 each 0   insulin aspart (NOVOLOG) 100 UNIT/ML FlexPen Inject 0-6 Units into the skin 3 (three) times daily with meals. 3 mL 3   insulin aspart (NOVOLOG) 100 UNIT/ML FlexPen Inject 0-6 Units into the skin 3 (three) times daily with meals. 3 mL 3    insulin glargine (LANTUS) 100 UNIT/ML Solostar Pen Inject 15 Units into the skin daily. 3 mL 3   insulin glargine (LANTUS) 100 UNIT/ML Solostar Pen Inject 15 Units into the skin daily. 3 mL 3   Insulin Pen Needle 32G X 4 MM MISC Use to inject insulin up to 4 times daily. 100 each 0   linagliptin (TRADJENTA) 5 MG TABS tablet Take 1 tablet (5 mg total) by mouth daily. 30 tablet 3   LORazepam (ATIVAN) 0.5 MG tablet Take 1 tablet (0.5 mg total) by mouth at bedtime. 7 tablet 0   miconazole nitrate (MICATIN) POWD Apply 1 application. topically 3 (three) times daily. 5 g 0   midodrine (PROAMATINE) 10 MG tablet Take 1 tablet (10 mg total) by mouth 3 (three) times daily with meals. 21 tablet 0   paliperidone (INVEGA) 3 MG 24 hr tablet Take 1 tablet (3 mg total) by mouth at bedtime. 30 tablet 3   tamsulosin (FLOMAX) 0.4 MG CAPS capsule Take 1 capsule (0.4 mg total) by mouth daily. 30 capsule 11   No current facility-administered medications for this visit.     Objective: Vital signs in last 24 hours: BP 115/85   Pulse 90   Intake/Output from previous day: No intake/output data recorded. Intake/Output this shift: _0 @   Physical Exam Vitals reviewed.  Constitutional:      Appearance: Normal appearance.     Comments: disheveled  HENT:     Head: Normocephalic and atraumatic.  Cardiovascular:     Rate and Rhythm: Normal rate and regular rhythm.     Heart sounds: Normal heart sounds.  Pulmonary:     Effort: Pulmonary effort is normal.     Breath sounds: Normal breath sounds.  Abdominal:     General: Abdomen is flat.     Palpations: Abdomen is soft.     Tenderness: There is no abdominal tenderness.     Hernia: No hernia is present.     Comments: Upper midline incision is healing well.   Genitourinary:    Comments: Uncirc phallus with moderate to severe phimosis with foley at the meatus. Scrotum, testes and epididymis normal. AP without lesion.  NST. Prostate was small on  CT Musculoskeletal:        General: No swelling. Normal range of motion.     Cervical back: Normal range of motion.  Skin:    General: Skin is warm and dry.  Neurological:     General: No focal deficit present.     Mental Status: He is alert and oriented to person, place, and time.  Psychiatric:  Mood and Affect: Mood normal.        Behavior: Behavior normal.     Lab Results:  No results found for this or any previous visit (from the past 24 hour(s)).  BMET No results for input(s): "NA", "K", "CL", "CO2", "GLUCOSE", "BUN", "CREATININE", "CALCIUM" in the last 72 hours. PT/INR No results for input(s): "LABPROT", "INR" in the last 72 hours. ABG No results for input(s): "PHART", "HCO3" in the last 72 hours.  Invalid input(s): "PCO2", "PO2" His UA was clear on 10/31 and his Cr was 0.9 on 01/30/22.  Studies/Results: No results found. I have reviewed his recent CT's and hospital notes from [facility].  IMPRESSION:  Postsurgical changes of recent appendectomy. Surgical drains have  been removed. There is a persistent thin elongated fluid collection  just deep to the anterior abdominal wall measuring 0.4 cm short axis  and 6.1 cm in width. This is decreased in size in comparison to  prior when it measured 0.9 cm short axis and 7.5 cm in width, and is  not a drainable collection due to its configuration. No new fluid  collection or other acute findings.   Marked distension of the urinary bladder.     Assessment/Plan: Urinary retention.   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   I will start him on tamsulosin and have him return next week for a voiding trial.  If he fails, he will need more prolonged catheter drainage and consideration of urodynamics.  Phimosis.   He has moderate to severe phimosis but no balanitis.  I will monitor this situation.   Meds ordered this encounter   Medications   tamsulosin (FLOMAX) 0.4 MG CAPS capsule    Sig: Take 1 capsule (0.4 mg total) by mouth daily.    Dispense:  30 capsule    Refill:  11     No orders of the defined types were placed in this encounter.    Return in about 6 days (around 02/15/2022) for Fu for a voiding trial next Weds and return for an OV and PVR on Thursday if he is able to void. .    CC: Virginia [place] NP     Irine Seal 02/09/2022

## 2022-02-10 LAB — PROINSULIN/INSULIN RATIO
Insulin: 31 u[IU]/mL — ABNORMAL HIGH
Proinsulin: 26.8 pmol/L — ABNORMAL HIGH

## 2022-02-17 ENCOUNTER — Ambulatory Visit: Payer: Medicaid Other | Admitting: Physician Assistant

## 2022-02-22 ENCOUNTER — Ambulatory Visit (INDEPENDENT_AMBULATORY_CARE_PROVIDER_SITE_OTHER): Payer: Medicaid Other | Admitting: Physician Assistant

## 2022-02-22 VITALS — BP 122/91 | HR 98

## 2022-02-22 DIAGNOSIS — N471 Phimosis: Secondary | ICD-10-CM | POA: Diagnosis not present

## 2022-02-22 DIAGNOSIS — R339 Retention of urine, unspecified: Secondary | ICD-10-CM

## 2022-02-22 NOTE — Progress Notes (Signed)
Assessment: 1. Urinary retention - Bladder Voiding Trial  2. Phimosis    Plan: Patient had no difficulty voiding after catheter removed today.  He and his caretaker are given instructions for him to continue tamsulosin daily and he will follow-up in 1 month for PVR and recheck of phimosis with Dr. Annabell Howells.  Strict ED precautions given if the patient develops voiding difficulty over the long holiday when our office is closed.   Chief Complaint: No chief complaint on file.   HPI: Garrett [name] used for visit: Language-Garrett [name] is a 36 y.o. male who presents for continued evaluation of urinary retention and phimosis.  The patient confirms he has been taking daily Flomax since his last visit on 02/09/2022.  He has tolerated the Foley well and has no complaints today.  No gross hematuria.  02/09/22 Garrett [name] is a 36 yo male with a history of diabetes without neuropathy symptoms.  He was at [facility] for a ruptured appendix in October.   He was back in the ER on 10/30 for abdominal pain and diarrhea and was found on CT to have urinary retention but his CT on  01/25/22 also showed retention.  He still has a foley in place.   He also had penile edema and severe phimosis as well.  He had some difficulty voiding for some time prior to his ER visit.  He had no prior evaluation or treatment for the voiding.      Portions of the above documentation were copied from a prior visit for review purposes only.  Allergies: No Known Allergies  PMH: Past Medical History:  Diagnosis Date   Depression    Diabetes mellitus (HCC)     PSH: Past Surgical History:  Procedure Laterality Date   APPENDECTOMY     EXPLORATORY LAPAROTOMY      SH: Social History   Tobacco Use   Smoking status: Never   Smokeless tobacco: Never    ROS: All other review of systems were reviewed and are negative except what is noted above in HPI  PE: BP (!) 122/91   Pulse 98  GENERAL APPEARANCE:   Well appearing, well developed, well nourished, NAD HEENT:  Atraumatic, normocephalic NECK:  Supple. Trachea midline ABDOMEN:  Soft, non-tender, no masses EXTREMITIES:  Moves all extremities well, without clubbing, cyanosis, or edema NEUROLOGIC:  Alert and oriented x 3, normal gait MENTAL STATUS:  appropriate BACK:  Non-tender to palpation, No CVAT SKIN:  Warm, dry, and intact   Results: Laboratory Data: Lab Results  Component Value Date   WBC 7.7 07/01/2021   HGB 12.3 (L) 07/01/2021   HCT 36.5 (L) 07/01/2021   MCV 92.2 07/01/2021   PLT 165 07/01/2021    Lab Results  Component Value Date   CREATININE 0.88 07/01/2021    No results found for: "PSA"  No results found for: "TESTOSTERONE"  Lab Results  Component Value Date   HGBA1C 5.5 05/14/2021    Urinalysis    Component Value Date/Time   COLORURINE YELLOW 05/05/2021 1744   APPEARANCEUR CLEAR 05/05/2021 1744   LABSPEC 1.010 05/05/2021 1744   PHURINE 7.0 05/05/2021 1744   GLUCOSEU NEGATIVE 05/05/2021 1744   HGBUR NEGATIVE 05/05/2021 1744   BILIRUBINUR NEGATIVE 05/05/2021 1744   BILIRUBINUR negative 07/01/2020 1042   KETONESUR NEGATIVE 05/05/2021 1744   PROTEINUR NEGATIVE 05/05/2021 1744   UROBILINOGEN negative (A) 07/01/2020 1042   NITRITE NEGATIVE 05/05/2021 1744   LEUKOCYTESUR NEGATIVE 05/05/2021 1744    Lab  Results  Component Value Date   BACTERIA RARE (A) 03/28/2021    Pertinent Imaging: No results found for this or any previous visit.  No results found for this or any previous visit.  No results found for this or any previous visit.  No results found for this or any previous visit.  No results found for this or any previous visit.  No valid procedures specified. No results found for this or any previous visit.  No results found for this or any previous visit.  No results found for this or any previous visit (from the past 24 hour(s)).

## 2022-02-22 NOTE — Progress Notes (Signed)
Open in error

## 2022-02-22 NOTE — Progress Notes (Signed)
Fill and Pull Catheter Removal  Patient is present today for a catheter removal.  Patient was cleaned and prepped in a sterile fashion of sterile water/ saline was instilled into the bladder when the patient felt the urge to urinate. 74ml of water was then drained from the balloon.  A 16FR foley cath was removed from the bladder no complications were noted .  Patient as then given some time to void on their own.  Patient can void  on their own after some time.  Patient tolerated well.  Performed by: Guss Bunde, CMA  Follow up/ Additional notes: PA to see after.

## 2022-02-25 ENCOUNTER — Emergency Department (HOSPITAL_COMMUNITY): Payer: Medicaid Other

## 2022-02-25 ENCOUNTER — Other Ambulatory Visit: Payer: Self-pay

## 2022-02-25 ENCOUNTER — Encounter (HOSPITAL_COMMUNITY): Payer: Self-pay | Admitting: *Deleted

## 2022-02-25 ENCOUNTER — Emergency Department (HOSPITAL_COMMUNITY)
Admission: EM | Admit: 2022-02-25 | Discharge: 2022-02-26 | Disposition: A | Discharge status: Home / Self Care | Payer: Medicaid Other | Attending: Emergency Medicine | Admitting: Emergency Medicine

## 2022-02-25 DIAGNOSIS — R1110 Vomiting, unspecified: Secondary | ICD-10-CM | POA: Insufficient documentation

## 2022-02-25 DIAGNOSIS — Z7984 Long term (current) use of oral hypoglycemic drugs: Secondary | ICD-10-CM | POA: Diagnosis not present

## 2022-02-25 DIAGNOSIS — N3001 Acute cystitis with hematuria: Secondary | ICD-10-CM | POA: Insufficient documentation

## 2022-02-25 DIAGNOSIS — E119 Type 2 diabetes mellitus without complications: Secondary | ICD-10-CM | POA: Diagnosis not present

## 2022-02-25 DIAGNOSIS — Z794 Long term (current) use of insulin: Secondary | ICD-10-CM | POA: Diagnosis not present

## 2022-02-25 LAB — URINALYSIS, ROUTINE W REFLEX MICROSCOPIC
Bilirubin Urine: NEGATIVE
Glucose, UA: NEGATIVE mg/dL
Ketones, ur: 5 mg/dL — AB
Nitrite: NEGATIVE
Protein, ur: 100 mg/dL — AB
Specific Gravity, Urine: 1.023 (ref 1.005–1.030)
WBC, UA: >50 WBC/hpf — ABNORMAL HIGH (ref 0–5)
pH: 5 (ref 5.0–8.0)

## 2022-02-25 LAB — CBC WITH DIFFERENTIAL/PLATELET
Abs Immature Granulocytes: 0.03 K/uL (ref 0.00–0.07)
Basophils Absolute: 0.1 K/uL (ref 0.0–0.1)
Basophils Relative: 0 %
Eosinophils Absolute: 0.1 K/uL (ref 0.0–0.5)
Eosinophils Relative: 1 %
HCT: 43.9 % (ref 39.0–52.0)
Hemoglobin: 14.4 g/dL (ref 13.0–17.0)
Immature Granulocytes: 0 %
Lymphocytes Relative: 31 %
Lymphs Abs: 3.8 K/uL (ref 0.7–4.0)
MCH: 31 pg (ref 26.0–34.0)
MCHC: 32.8 g/dL (ref 30.0–36.0)
MCV: 94.6 fL (ref 80.0–100.0)
Monocytes Absolute: 0.9 K/uL (ref 0.1–1.0)
Monocytes Relative: 8 %
Neutro Abs: 7.3 K/uL (ref 1.7–7.7)
Neutrophils Relative %: 60 %
Platelets: 259 K/uL (ref 150–400)
RBC: 4.64 MIL/uL (ref 4.22–5.81)
RDW: 13.4 % (ref 11.5–15.5)
WBC: 12.2 K/uL — ABNORMAL HIGH (ref 4.0–10.5)
nRBC: 0 % (ref 0.0–0.2)

## 2022-02-25 LAB — COMPREHENSIVE METABOLIC PANEL
ALT: 137 U/L — ABNORMAL HIGH (ref 0–44)
AST: 108 U/L — ABNORMAL HIGH (ref 15–41)
Albumin: 3.6 g/dL (ref 3.5–5.0)
Alkaline Phosphatase: 68 U/L (ref 38–126)
Anion gap: 12 (ref 5–15)
BUN: 24 mg/dL — ABNORMAL HIGH (ref 6–20)
CO2: 26 mmol/L (ref 22–32)
Calcium: 9.4 mg/dL (ref 8.9–10.3)
Chloride: 106 mmol/L (ref 98–111)
Creatinine, Ser: 1.02 mg/dL (ref 0.61–1.24)
GFR, Estimated: >60 mL/min (ref >60)
Glucose, Bld: 96 mg/dL (ref 70–99)
Potassium: 3.7 mmol/L (ref 3.5–5.1)
Sodium: 144 mmol/L (ref 135–145)
Total Bilirubin: 1.8 mg/dL — ABNORMAL HIGH (ref 0.3–1.2)
Total Protein: 9.7 g/dL — ABNORMAL HIGH (ref 6.5–8.1)

## 2022-02-25 LAB — MAGNESIUM: Magnesium: 2.4 mg/dL (ref 1.7–2.4)

## 2022-02-25 LAB — LACTIC ACID, PLASMA: Lactic Acid, Venous: 2.1 mmol/L (ref 0.5–1.9)

## 2022-02-25 LAB — LIPASE, BLOOD: Lipase: 43 U/L (ref 11–51)

## 2022-02-25 MED ORDER — SODIUM CHLORIDE 0.9 % IV SOLN
1.0000 g | Freq: Once | INTRAVENOUS | Status: AC
  Administered 2022-02-25: 1 g via INTRAVENOUS
  Filled 2022-02-25: qty 10

## 2022-02-25 MED ORDER — CEPHALEXIN 500 MG PO CAPS: 500.0000 mg | ORAL_CAPSULE | Freq: Three times a day (TID) | ORAL | 0 refills | Status: DC

## 2022-02-25 MED ORDER — SODIUM CHLORIDE 0.9 % IV BOLUS
1000.0000 mL | Freq: Once | INTRAVENOUS | Status: AC
  Administered 2022-02-25: 1000 mL via INTRAVENOUS

## 2022-02-25 MED ORDER — ONDANSETRON HCL 4 MG PO TABS: 4.0000 mg | ORAL_TABLET | Freq: Four times a day (QID) | ORAL | 0 refills | Status: DC

## 2022-02-25 MED ORDER — ONDANSETRON HCL 4 MG/2ML IJ SOLN
4.0000 mg | Freq: Once | INTRAMUSCULAR | Status: AC
  Administered 2022-02-25: 4 mg via INTRAVENOUS
  Filled 2022-02-25: qty 2

## 2022-02-25 MED ORDER — IOHEXOL 300 MG/ML  SOLN
80.0000 mL | Freq: Once | INTRAMUSCULAR | Status: AC | PRN
  Administered 2022-02-25: 80 mL via INTRAVENOUS

## 2022-02-25 NOTE — Discharge Instructions (Addendum)
Your testing shows that you have some urinary retention as well as a urinary infection.  We have placed a catheter back in your bladder and you will need to follow-up with the urologist within 1 week, additionally we have placed you on an antibiotic which will treat the infection.  Please return to the emergency department immediately for severe or worsening symptoms, including increasing weakness, vomiting or fevers.  Cephalexin 3 times a day for 7 days to treat the infection  Zofran every 4 hours as needed for nausea  Thank you for allowing [place] to treat you in the emergency department today.  After reviewing your examination and potential testing that was done it appears that you are safe to go home.  I would like for you to follow-up with your doctor within the next several days, have them obtain your results and follow-up with them to review all of these tests.  If you should develop severe or worsening symptoms return to the emergency department immediately

## 2022-02-25 NOTE — ED Triage Notes (Signed)
Pt with recent surgery but staff unable to state what surgery.  Pt not eating for past 3 days, reported that pt ate a peach and with emesis.  Pt does not speak english, reported that pt speaks [place]

## 2022-02-25 NOTE — ED Provider Notes (Signed)
[facility] EMERGENCY DEPARTMENT Provider Note   CSN: [id number] Arrival date & time: 02/25/22  2017     History  Chief Complaint  Patient presents with   Emesis    Garrett [name] is a 36 y.o. male.   Emesis  This patient is a 36 year old male, evidently the patient is from [place] and speaks a language called "[name]".  He had recently been admitted to an outside hospital for a perforated appendicitis  A more thorough history was obtained from the chart from December 2022 about 1 year ago, this noted that the patient has a history of severe depression with psychosis, type 2 diabetes and orthostatic hypotension.  He had spent approximately 2 months in the emergency department at that time.  And was finally admitted because of elevated transaminases and orthostatic hypotension.  At that time he was admitted to the hospital from December 2 through July 12, 2021, he was supposed to follow-up with the [facility], he was sent to a assisted living facility in [place] at that time  The patient was then diagnosed with a perforation of his bowel because of acute appendicitis which had perforated and admitted to the hospital on January 14, 2022 to an outside hospital.  He underwent an exploratory laparotomy and was sent back to his facility on January 27, 2022 approximately 4 weeks ago.  He was then seen in the emergency department several days later at the outside hospital because of urinary retention, Foley catheter was placed at that time and he was discharged back to his facility  He was seen by the urologist on February 09, 2022, followed up on November 22 and the Foley catheter was removed at that time.  That was approximately 3 days ago.  He did pass a voiding trial after the catheter was removed.  He comes today from the facility where he currently stays which is the [facility] family care facility here in [place].  Report is that he has not been eating or  drinking, he does not speak any English, nobody at the facility was able to give [place] any information on this patient.  The paramedics reported normal vital signs.  There was a report about some failure to thrive and very poor appetite.     Home Medications Prior to Admission medications   Medication Sig Start Date End Date Taking? Authorizing Provider  cephALEXin (KEFLEX) 500 MG capsule Take 1 capsule (500 mg total) by mouth 3 (three) times daily for 7 days. 02/25/22 03/04/22 Yes Noemi Chapel, MD  ondansetron (ZOFRAN) 4 MG tablet Take 1 tablet (4 mg total) by mouth every 6 (six) hours. 02/25/22  Yes Noemi Chapel, MD  Accu-Chek Softclix Lancets lancets Use to test blood sugars up to 4 times daily as directed. 07/11/21   Samella Parr, NP  acetaminophen (TYLENOL) 325 MG tablet Take 2 tablets (650 mg total) by mouth every 6 (six) hours as needed for mild pain or moderate pain. 07/11/21   Samella Parr, NP  benztropine (COGENTIN) 0.5 MG tablet Take 1 tablet (0.5 mg total) by mouth 2 (two) times daily. 07/11/21   Samella Parr, NP  Blood Glucose Monitoring Suppl (BLOOD GLUCOSE MONITOR SYSTEM) w/Device KIT Use to test blood sugars up to 4 times daily as directed. 07/11/21   Samella Parr, NP  Continuous Blood Gluc Receiver (DEXCOM G6 RECEIVER) DEVI 1 Device by Does not apply route daily. 07/14/21   Ladell Pier, MD  Continuous  Blood Gluc Sensor (DEXCOM G6 SENSOR) MISC 1 packet by Does not apply route daily. 07/14/21   Ladell Pier, MD  escitalopram (LEXAPRO) 10 MG tablet Take 1 tablet (10 mg total) by mouth daily. 07/12/21   Samella Parr, NP  feeding supplement, GLUCERNA SHAKE, (GLUCERNA SHAKE) LIQD Take 237 mLs by mouth 3 (three) times daily between meals. 07/11/21   Samella Parr, NP  gabapentin (NEURONTIN) 100 MG capsule Take 1 capsule (100 mg total) by mouth 2 (two) times daily. 07/11/21   Samella Parr, NP  glipiZIDE (GLUCOTROL) 5 MG tablet Take 1 tablet (5 mg total) by  mouth daily before breakfast. 07/12/21   Samella Parr, NP  glucose blood (ACCU-CHEK GUIDE) test strip Use to check blood sugars up to 4 times daily as directed. 07/11/21   Samella Parr, NP  insulin aspart (NOVOLOG) 100 UNIT/ML FlexPen Inject 0-6 Units into the skin 3 (three) times daily with meals. 07/11/21   Samella Parr, NP  insulin aspart (NOVOLOG) 100 UNIT/ML FlexPen Inject 0-6 Units into the skin 3 (three) times daily with meals. 07/11/21   Samella Parr, NP  insulin glargine (LANTUS) 100 UNIT/ML Solostar Pen Inject 15 Units into the skin daily. 07/12/21   Samella Parr, NP  insulin glargine (LANTUS) 100 UNIT/ML Solostar Pen Inject 15 Units into the skin daily. 07/12/21   Samella Parr, NP  Insulin Pen Needle 32G X 4 MM MISC Use to inject insulin up to 4 times daily. 07/11/21   Samella Parr, NP  linagliptin (TRADJENTA) 5 MG TABS tablet Take 1 tablet (5 mg total) by mouth daily. 07/12/21   Samella Parr, NP  LORazepam (ATIVAN) 0.5 MG tablet Take 1 tablet (0.5 mg total) by mouth at bedtime. 07/11/21   Samella Parr, NP  miconazole nitrate (MICATIN) POWD Apply 1 application. topically 3 (three) times daily. 07/11/21   Samella Parr, NP  midodrine (PROAMATINE) 10 MG tablet Take 1 tablet (10 mg total) by mouth 3 (three) times daily with meals. 07/11/21   Samella Parr, NP  paliperidone (INVEGA) 3 MG 24 hr tablet Take 1 tablet (3 mg total) by mouth at bedtime. 07/11/21   Samella Parr, NP  tamsulosin (FLOMAX) 0.4 MG CAPS capsule Take 1 capsule (0.4 mg total) by mouth daily. 02/09/22   Irine Seal, MD      Allergies    Patient has no known allergies.    Review of Systems   Review of Systems  Gastrointestinal:  Positive for vomiting.  All other systems reviewed and are negative.   Physical Exam Updated Vital Signs BP (!) 141/105   Pulse 83   Temp 97.6 F (36.4 C) (Oral)   Resp (!) 31   SpO2 100%  Physical Exam Vitals and nursing note reviewed.   Constitutional:      General: He is not in acute distress.    Appearance: He is well-developed. He is not ill-appearing.  HENT:     Head: Normocephalic and atraumatic.     Mouth/Throat:     Mouth: Mucous membranes are dry.     Pharynx: No oropharyngeal exudate.  Eyes:     General: No scleral icterus.       Right eye: No discharge.        Left eye: No discharge.     Conjunctiva/sclera: Conjunctivae normal.     Pupils: Pupils are equal, round, and reactive to light.  Neck:  Thyroid: No thyromegaly.     Vascular: No JVD.  Cardiovascular:     Rate and Rhythm: Normal rate and regular rhythm.     Heart sounds: Normal heart sounds. No murmur heard.    No friction rub. No gallop.  Pulmonary:     Effort: Pulmonary effort is normal. No respiratory distress.     Breath sounds: Normal breath sounds. No wheezing or rales.  Abdominal:     General: Bowel sounds are normal. There is no distension.     Palpations: Abdomen is soft. There is no mass.     Tenderness: There is no abdominal tenderness.     Comments: Exploratory laparotomy site is seeming to heal well, the Steri-Strips are still in place, there is no abdominal tenderness  Musculoskeletal:        General: No tenderness. Normal range of motion.     Cervical back: Normal range of motion and neck supple.     Right lower leg: No edema.     Left lower leg: No edema.  Lymphadenopathy:     Cervical: No cervical adenopathy.  Skin:    General: Skin is warm and dry.     Findings: No erythema or rash.  Neurological:     General: No focal deficit present.     Mental Status: He is alert.     Coordination: Coordination normal.  Psychiatric:        Behavior: Behavior normal.     ED Results / Procedures / Treatments   Labs (all labs ordered are listed, but only abnormal results are displayed) Labs Reviewed  CBC WITH DIFFERENTIAL/PLATELET - Abnormal; Notable for the following components:      Result Value   WBC 12.2 (*)    All  other components within normal limits  COMPREHENSIVE METABOLIC PANEL - Abnormal; Notable for the following components:   BUN 24 (*)    Total Protein 9.7 (*)    AST 108 (*)    ALT 137 (*)    Total Bilirubin 1.8 (*)    All other components within normal limits  URINALYSIS, ROUTINE W REFLEX MICROSCOPIC - Abnormal; Notable for the following components:   Color, Urine AMBER (*)    APPearance HAZY (*)    Hgb urine dipstick MODERATE (*)    Ketones, ur 5 (*)    Protein, ur 100 (*)    Leukocytes,Ua LARGE (*)    WBC, UA >50 (*)    Bacteria, UA MANY (*)    All other components within normal limits  LACTIC ACID, PLASMA - Abnormal; Notable for the following components:   Lactic Acid, Venous 2.1 (*)    All other components within normal limits  URINE CULTURE  LIPASE, BLOOD  MAGNESIUM  LACTIC ACID, PLASMA    EKG EKG Interpretation  Date/Time:  Saturday February 25 2022 20:44:05 EST Ventricular Rate:  88 PR Interval:  121 QRS Duration: 89 QT Interval:  368 QTC Calculation: 446 R Axis:   91 Text Interpretation: Sinus rhythm Borderline right axis deviation Borderline T abnormalities, inferior leads Confirmed by Noemi Chapel [phone]) on 02/25/2022 9:05:29 PM  Radiology CT ABDOMEN PELVIS W CONTRAST  Result Date: 02/25/2022 CLINICAL DATA:  Bowel obstruction suspected EXAM: CT ABDOMEN AND PELVIS WITH CONTRAST TECHNIQUE: Multidetector CT imaging of the abdomen and pelvis was performed using the standard protocol following bolus administration of intravenous contrast. RADIATION DOSE REDUCTION: This exam was performed according to the departmental dose-optimization program which includes automated exposure control, adjustment of the mA  and/or kV according to patient size and/or use of iterative reconstruction technique. CONTRAST:  38m OMNIPAQUE IOHEXOL 300 MG/ML  SOLN COMPARISON:  CT pelvis 06/03/2021, CT abdomen pelvis 03/09/2020 FINDINGS: Lower chest: No acute abnormality. Hepatobiliary: No  focal liver abnormality. No gallstones, gallbladder wall thickening, or pericholecystic fluid. No biliary dilatation. Pancreas: No focal lesion. Normal pancreatic contour. No surrounding inflammatory changes. No main pancreatic ductal dilatation. Spleen: Normal in size without focal abnormality. Adrenals/Urinary Tract: No adrenal nodule bilaterally. Bilateral kidneys enhance symmetrically. No hydronephrosis. No hydroureter. Slight hyperemia of the urinary bladder mucosa with question slight urinary bladder wall thickening. Gas noted within the urinary bladder lumen. Stomach/Bowel: Stomach is within normal limits. No evidence of bowel wall thickening or dilatation. Redundant sigmoid colon. Status post appendectomy. Vascular/Lymphatic: No abdominal aorta or iliac aneurysm. Moderate to severe noncalcified and calcified atherosclerotic plaque of the aorta and its branches. No abdominal, pelvic, or inguinal lymphadenopathy. Reproductive: Prostate is unremarkable. Other: No intraperitoneal free fluid. No intraperitoneal free gas. No organized fluid collection. Musculoskeletal: No abdominal wall hernia or abnormality.  Bilateral gynecomastia. No suspicious lytic or blastic osseous lesions. No acute displaced fracture. Multilevel degenerative changes of the spine. IMPRESSION: 1. No findings of bowel obstruction. 2. Slight prominence of the urinary bladder wall and mucosal hyperemia with associated gas within the urinary bladder lumen. Recommend correlation with urinalysis for infection. 3.  Aortic Atherosclerosis (ICD10-I70.0). Electronically Signed   By: MIven FinnM.D.   On: 02/25/2022 22:17    Procedures Procedures    Medications Ordered in ED Medications  cefTRIAXone (ROCEPHIN) 1 g in sodium chloride 0.9 % 100 mL IVPB (1 g Intravenous New Bag/Given 02/25/22 2335)  sodium chloride 0.9 % bolus 1,000 mL (0 mLs Intravenous Stopped 02/25/22 2255)  ondansetron (ZOFRAN) injection 4 mg (4 mg Intravenous Given  02/25/22 2114)  iohexol (OMNIPAQUE) 300 MG/ML solution 80 mL (80 mLs Intravenous Contrast Given 02/25/22 2147)  sodium chloride 0.9 % bolus 1,000 mL (1,000 mLs Intravenous New Bag/Given 02/25/22 2335)    ED Course/ Medical Decision Making/ A&P                           Medical Decision Making Amount and/or Complexity of Data Reviewed Labs: ordered. Radiology: ordered.  Risk Prescription drug management.   This patient presents to the ED for concern of nausea, failure to thrive, dehydration, this involves an extensive number of treatment options, and is a complaint that carries with it a high risk of complications and morbidity.  The differential diagnosis includes multiple different etiologies, would consider infectious etiology, dehydration, potential postoperative complications such as intra-abdominal abscess.   Co morbidities that complicate the patient evaluation  Language barrier Diabetes Urinary retention Depression and psychosis   Additional history obtained:  Additional history obtained from electronic medical record External records from outside source obtained and reviewed including scaring the medical record at length, prior discharge summaries outpatient urology follow-ups and surgical procedures   Lab Tests:  I Ordered, and personally interpreted labs.  The pertinent results include: Labs show minimal leukocytosis, normal renal function, urinalysis with urinary infection.   Imaging Studies ordered:  I ordered imaging studies including CT scan of the abdomen and pelvis I independently visualized and interpreted imaging which showed some thickening of the bladder wall consistent with cystitis I agree with the radiologist interpretation   Cardiac Monitoring: / EKG:  The patient was maintained on a cardiac monitor.  I personally viewed and interpreted the  cardiac monitored which showed an underlying rhythm of: Normal sinus rhythm, no tachycardia, no  hypotension, no fever   Problem List / ED Course / Critical interventions / Medication management  Urinary tract infection likely causing general weakness nausea I ordered medication including Rocephin and IV fluids for what appears to be some dehydration clinically and historically as well as urinary infection Reevaluation of the patient after these medicines showed that the patient improved I have reviewed the patients home medicines and have made adjustments as needed   Social Determinants of Health:  Lives in a group home situation   Test / Admission - Considered:  Considered admission but the patient is not febrile nor is he tachycardic, he is not vomiting, he has been given IV fluids and antibiotics and appears stable for discharge Thankfully CT scan does not reveal any signs of surgical etiologies after his operative intervention from last month         Final Clinical Impression(s) / ED Diagnoses Final diagnoses:  Acute cystitis with hematuria    Rx / DC Orders ED Discharge Orders          Ordered    cephALEXin (KEFLEX) 500 MG capsule  3 times daily        02/25/22 2326    ondansetron (ZOFRAN) 4 MG tablet  Every 6 hours        02/25/22 2327              Noemi Chapel, MD 02/25/22 2341

## 2022-02-25 NOTE — ED Notes (Signed)
Pt does not speak English and not able to get interpretor for [place]

## 2022-02-25 NOTE — ED Notes (Signed)
Patient transported to CT 

## 2022-02-25 NOTE — ED Notes (Signed)
CN was able to get an interpretor on the phone

## 2022-02-26 LAB — LACTIC ACID, PLASMA: Lactic Acid, Venous: 2.2 mmol/L (ref 0.5–1.9)

## 2022-02-26 NOTE — ED Notes (Signed)
Attempted to call report back to facility multiple times with no answer.

## 2022-02-27 ENCOUNTER — Other Ambulatory Visit: Payer: Self-pay

## 2022-02-27 ENCOUNTER — Emergency Department (HOSPITAL_COMMUNITY)
Admission: EM | Admit: 2022-02-27 | Discharge: 2022-02-27 | Disposition: A | Discharge status: Home / Self Care | Payer: Medicaid Other | Attending: Emergency Medicine | Admitting: Emergency Medicine

## 2022-02-27 ENCOUNTER — Emergency Department (HOSPITAL_COMMUNITY): Payer: Medicaid Other

## 2022-02-27 ENCOUNTER — Encounter (HOSPITAL_COMMUNITY): Payer: Self-pay

## 2022-02-27 DIAGNOSIS — Z794 Long term (current) use of insulin: Secondary | ICD-10-CM | POA: Diagnosis not present

## 2022-02-27 DIAGNOSIS — R112 Nausea with vomiting, unspecified: Secondary | ICD-10-CM | POA: Diagnosis present

## 2022-02-27 DIAGNOSIS — Z7984 Long term (current) use of oral hypoglycemic drugs: Secondary | ICD-10-CM | POA: Diagnosis not present

## 2022-02-27 DIAGNOSIS — N3090 Cystitis, unspecified without hematuria: Secondary | ICD-10-CM | POA: Insufficient documentation

## 2022-02-27 LAB — COMPREHENSIVE METABOLIC PANEL
ALT: 109 U/L — ABNORMAL HIGH (ref 0–44)
AST: 99 U/L — ABNORMAL HIGH (ref 15–41)
Albumin: 3.4 g/dL — ABNORMAL LOW (ref 3.5–5.0)
Alkaline Phosphatase: 59 U/L (ref 38–126)
Anion gap: 11 (ref 5–15)
BUN: 18 mg/dL (ref 6–20)
CO2: 26 mmol/L (ref 22–32)
Calcium: 8.9 mg/dL (ref 8.9–10.3)
Chloride: 108 mmol/L (ref 98–111)
Creatinine, Ser: 0.83 mg/dL (ref 0.61–1.24)
GFR, Estimated: >60 mL/min (ref >60)
Glucose, Bld: 97 mg/dL (ref 70–99)
Potassium: 3.6 mmol/L (ref 3.5–5.1)
Sodium: 145 mmol/L (ref 135–145)
Total Bilirubin: 2.1 mg/dL — ABNORMAL HIGH (ref 0.3–1.2)
Total Protein: 9 g/dL — ABNORMAL HIGH (ref 6.5–8.1)

## 2022-02-27 LAB — CBC
HCT: 41.6 % (ref 39.0–52.0)
Hemoglobin: 13.4 g/dL (ref 13.0–17.0)
MCH: 30.9 pg (ref 26.0–34.0)
MCHC: 32.2 g/dL (ref 30.0–36.0)
MCV: 96.1 fL (ref 80.0–100.0)
Platelets: 197 10*3/uL (ref 150–400)
RBC: 4.33 MIL/uL (ref 4.22–5.81)
RDW: 13.2 % (ref 11.5–15.5)
WBC: 7.8 10*3/uL (ref 4.0–10.5)
nRBC: 0 % (ref 0.0–0.2)

## 2022-02-27 LAB — URINALYSIS, ROUTINE W REFLEX MICROSCOPIC
Bilirubin Urine: NEGATIVE
Glucose, UA: NEGATIVE mg/dL
Ketones, ur: 20 mg/dL — AB
Nitrite: POSITIVE — AB
Protein, ur: 100 mg/dL — AB
Specific Gravity, Urine: 1.029 (ref 1.005–1.030)
WBC, UA: >50 WBC/hpf — ABNORMAL HIGH (ref 0–5)
pH: 5 (ref 5.0–8.0)

## 2022-02-27 LAB — LIPASE, BLOOD: Lipase: 51 U/L (ref 11–51)

## 2022-02-27 MED ORDER — FAMOTIDINE IN NACL 20-0.9 MG/50ML-% IV SOLN
20.0000 mg | Freq: Once | INTRAVENOUS | Status: AC
  Administered 2022-02-27: 20 mg via INTRAVENOUS
  Filled 2022-02-27: qty 50

## 2022-02-27 MED ORDER — CIPROFLOXACIN HCL 500 MG PO TABS: 500.0000 mg | ORAL_TABLET | Freq: Two times a day (BID) | ORAL | 0 refills | Status: DC

## 2022-02-27 MED ORDER — PROMETHAZINE HCL 25 MG RE SUPP: 25.0000 mg | Freq: Four times a day (QID) | RECTAL | 0 refills | Status: DC | PRN

## 2022-02-27 MED ORDER — IOHEXOL 300 MG/ML  SOLN
75.0000 mL | Freq: Once | INTRAMUSCULAR | Status: AC | PRN
  Administered 2022-02-27: 75 mL via INTRAVENOUS

## 2022-02-27 MED ORDER — SODIUM CHLORIDE 0.9 % IV BOLUS
1000.0000 mL | Freq: Once | INTRAVENOUS | Status: AC
  Administered 2022-02-27: 1000 mL via INTRAVENOUS

## 2022-02-27 MED ORDER — ONDANSETRON 4 MG PO TBDP: 4.0000 mg | ORAL_TABLET | Freq: Three times a day (TID) | ORAL | 0 refills | Status: AC | PRN

## 2022-02-27 MED ORDER — HALOPERIDOL LACTATE 5 MG/ML IJ SOLN
2.0000 mg | Freq: Once | INTRAMUSCULAR | Status: AC
  Administered 2022-02-27: 2 mg via INTRAMUSCULAR
  Filled 2022-02-27: qty 1

## 2022-02-27 MED ORDER — CIPROFLOXACIN IN D5W 400 MG/200ML IV SOLN
400.0000 mg | Freq: Once | INTRAVENOUS | Status: AC
  Administered 2022-02-27: 400 mg via INTRAVENOUS
  Filled 2022-02-27: qty 200

## 2022-02-27 MED ORDER — ONDANSETRON HCL 4 MG/2ML IJ SOLN: 4.0000 mg | Freq: Once | INTRAMUSCULAR | Status: DC | PRN

## 2022-02-27 NOTE — ED Provider Notes (Signed)
[facility] EMERGENCY DEPARTMENT Provider Note   CSN: [id number] Arrival date & time: 02/27/22  1754     History  Chief Complaint  Patient presents with   Emesis    Garrett [name] is a 36 y.o. male who speaks [name] presenting to the ED with nausea/vomiting.  [name] translator was used.  The patient reports he has vomited more than 3 times a day and is experiencing heartburn.  He was seen in the ED 2 days ago, had a work-up including UA which was consistent with possible infection.  He was given Rocephin and started on cephalexin but has not been able to take it due to vomiting the past 2 days.  Urine culture subsequently as shown 100K Pseudomonas with sensitivities pending.  He was managed last month for perforated appendicitis per review of external records.  HPI     Home Medications Prior to Admission medications   Medication Sig Start Date End Date Taking? Authorizing Provider  ciprofloxacin (CIPRO) 500 MG tablet Take 1 tablet (500 mg total) by mouth every 12 (twelve) hours for 7 days. 02/28/22 03/07/22 Yes Garrett [name], Garrett Rhine, MD  ondansetron (ZOFRAN-ODT) 4 MG disintegrating tablet Take 1 tablet (4 mg total) by mouth every 8 (eight) hours as needed for up to 15 doses for nausea or vomiting. 02/27/22  Yes Garrett [name], Garrett Rhine, MD  promethazine (PHENERGAN) 25 MG suppository Place 1 suppository (25 mg total) rectally every 6 (six) hours as needed for up to 6 doses for nausea or vomiting. 02/27/22  Yes Garrett Dusky, MD  Accu-Chek Softclix Lancets lancets Use to test blood sugars up to 4 times daily as directed. 07/11/21   Garrett Parr, NP  acetaminophen (TYLENOL) 325 MG tablet Take 2 tablets (650 mg total) by mouth every 6 (six) hours as needed for mild pain or moderate pain. 07/11/21   Garrett Parr, NP  benztropine (COGENTIN) 0.5 MG tablet Take 1 tablet (0.5 mg total) by mouth 2 (two) times daily. 07/11/21   Garrett Parr, NP  Blood Glucose Monitoring Suppl (BLOOD GLUCOSE MONITOR  SYSTEM) w/Device KIT Use to test blood sugars up to 4 times daily as directed. 07/11/21   Garrett Parr, NP  Continuous Blood Gluc Receiver (DEXCOM G6 RECEIVER) DEVI 1 Device by Does not apply route daily. 07/14/21   Garrett Pier, MD  Continuous Blood Gluc Sensor (DEXCOM G6 SENSOR) MISC 1 packet by Does not apply route daily. 07/14/21   Garrett Pier, MD  escitalopram (LEXAPRO) 10 MG tablet Take 1 tablet (10 mg total) by mouth daily. 07/12/21   Garrett Parr, NP  feeding supplement, GLUCERNA SHAKE, (GLUCERNA SHAKE) LIQD Take 237 mLs by mouth 3 (three) times daily between meals. 07/11/21   Garrett Parr, NP  gabapentin (NEURONTIN) 100 MG capsule Take 1 capsule (100 mg total) by mouth 2 (two) times daily. 07/11/21   Garrett Parr, NP  glipiZIDE (GLUCOTROL) 5 MG tablet Take 1 tablet (5 mg total) by mouth daily before breakfast. 07/12/21   Garrett Parr, NP  glucose blood (ACCU-CHEK GUIDE) test strip Use to check blood sugars up to 4 times daily as directed. 07/11/21   Garrett Parr, NP  insulin aspart (NOVOLOG) 100 UNIT/ML FlexPen Inject 0-6 Units into the skin 3 (three) times daily with meals. 07/11/21   Garrett Parr, NP  insulin aspart (NOVOLOG) 100 UNIT/ML FlexPen Inject 0-6 Units into the skin 3 (three) times daily with meals. 07/11/21   Garrett Hearing  L, NP  insulin glargine (LANTUS) 100 UNIT/ML Solostar Pen Inject 15 Units into the skin daily. 07/12/21   Garrett Parr, NP  insulin glargine (LANTUS) 100 UNIT/ML Solostar Pen Inject 15 Units into the skin daily. 07/12/21   Garrett Parr, NP  Insulin Pen Needle 32G X 4 MM MISC Use to inject insulin up to 4 times daily. 07/11/21   Garrett Parr, NP  linagliptin (TRADJENTA) 5 MG TABS tablet Take 1 tablet (5 mg total) by mouth daily. 07/12/21   Garrett Parr, NP  LORazepam (ATIVAN) 0.5 MG tablet Take 1 tablet (0.5 mg total) by mouth at bedtime. 07/11/21   Garrett Parr, NP  miconazole nitrate (MICATIN) POWD Apply 1  application. topically 3 (three) times daily. 07/11/21   Garrett Parr, NP  midodrine (PROAMATINE) 10 MG tablet Take 1 tablet (10 mg total) by mouth 3 (three) times daily with meals. 07/11/21   Garrett Parr, NP  ondansetron (ZOFRAN) 4 MG tablet Take 1 tablet (4 mg total) by mouth every 6 (six) hours. 02/25/22   Garrett Chapel, MD  paliperidone (INVEGA) 3 MG 24 hr tablet Take 1 tablet (3 mg total) by mouth at bedtime. 07/11/21   Garrett Parr, NP  tamsulosin (FLOMAX) 0.4 MG CAPS capsule Take 1 capsule (0.4 mg total) by mouth daily. 02/09/22   Garrett Seal, MD      Allergies    Patient has no known allergies.    Review of Systems   Review of Systems  Physical Exam Updated Vital Signs BP (!) 151/109   Pulse 83   Temp 98.9 F (37.2 C) (Oral)   Resp 18   Ht _0  (1.727 m)   Wt 49 kg   SpO2 98%   BMI 16.43 kg/m  Physical Exam Constitutional:      General: He is not in acute distress. HENT:     Head: Normocephalic and atraumatic.  Eyes:     Conjunctiva/sclera: Conjunctivae normal.     Pupils: Pupils are equal, round, and reactive to light.  Cardiovascular:     Rate and Rhythm: Normal rate and regular rhythm.  Pulmonary:     Effort: Pulmonary effort is normal. No respiratory distress.  Abdominal:     General: There is no distension.     Tenderness: There is no abdominal tenderness.  Skin:    General: Skin is warm and dry.  Neurological:     General: No focal deficit present.     Mental Status: He is alert. Mental status is at baseline.  Psychiatric:        Mood and Affect: Mood normal.        Behavior: Behavior normal.     ED Results / Procedures / Treatments   Labs (all labs ordered are listed, but only abnormal results are displayed) Labs Reviewed  COMPREHENSIVE METABOLIC PANEL - Abnormal; Notable for the following components:      Result Value   Total Protein 9.0 (*)    Albumin 3.4 (*)    AST 99 (*)    ALT 109 (*)    Total Bilirubin 2.1 (*)    All other  components within normal limits  URINALYSIS, ROUTINE W REFLEX MICROSCOPIC - Abnormal; Notable for the following components:   Color, Urine AMBER (*)    APPearance CLOUDY (*)    Hgb urine dipstick MODERATE (*)    Ketones, ur 20 (*)    Protein, ur 100 (*)    Nitrite POSITIVE (*)  Leukocytes,Ua LARGE (*)    WBC, UA >50 (*)    Bacteria, UA MANY (*)    All other components within normal limits  LIPASE, BLOOD  CBC    EKG None  Radiology CT ABDOMEN PELVIS W CONTRAST  Result Date: 02/27/2022 CLINICAL DATA:  Pyelonephritis (Ped 0-17y) Persistent UTI now with nausea/vomiting - evaluation for pyelo EXAM: CT ABDOMEN AND PELVIS WITH CONTRAST TECHNIQUE: Multidetector CT imaging of the abdomen and pelvis was performed using the standard protocol following bolus administration of intravenous contrast. RADIATION DOSE REDUCTION: This exam was performed according to the departmental dose-optimization program which includes automated exposure control, adjustment of the mA and/or kV according to patient size and/or use of iterative reconstruction technique. CONTRAST:  24m OMNIPAQUE IOHEXOL 300 MG/ML  SOLN COMPARISON:  CT abdomen pelvis 02/25/2022, CT abdomen pelvis 05/05/2021 FINDINGS: Slightly limited evaluation due to respiratory motion artifact. Lower chest: No acute abnormality. Hepatobiliary: No focal liver abnormality. No gallstones, gallbladder wall thickening, or pericholecystic fluid. No biliary dilatation. Pancreas: No focal lesion. Normal pancreatic contour. No surrounding inflammatory changes. No main pancreatic ductal dilatation. Spleen: Normal in size without focal abnormality. Adrenals/Urinary Tract: No adrenal nodule bilaterally. Bilateral kidneys enhance symmetrically.  No abscess formation. No hydronephrosis. No hydroureter.  No nephroureterolithiasis. Circumferential urinary bladder wall thickening mucosal hyperemia. The urinary bladder is mildly distended with urine. Foley catheter tip and  balloon terminate within the urinary bladder lumen. Air-fluid level noted likely due to Foley catheter insertion. Stomach/Bowel: Stomach is within normal limits. No evidence of bowel wall thickening or dilatation. Status post appendectomy. Vascular/Lymphatic: No abdominal aorta or iliac aneurysm. Mild atherosclerotic plaque of the aorta and its branches. No abdominal, pelvic, or inguinal lymphadenopathy. Reproductive: Prostate is unremarkable. Other: No intraperitoneal free fluid. No intraperitoneal free gas. No organized fluid collection. Musculoskeletal: No abdominal wall hernia or abnormality. No suspicious lytic or blastic osseous lesions. No acute displaced fracture. IMPRESSION: 1. Cystitis.  No definite CT evidence of pyelonephritis. 2.  Aortic Atherosclerosis (ICD10-I70.0). Electronically Signed   By: MIven FinnM.D.   On: 02/27/2022 21:23    Procedures Procedures    Medications Ordered in ED Medications  ondansetron ([facility] injection 4 mg (has no administration in time range)  sodium chloride 0.9 % bolus 1,000 mL (0 mLs Intravenous Stopped 02/27/22 2229)  haloperidol lactate (HALDOL) injection 2 mg (2 mg Intramuscular Given 02/27/22 2031)  famotidine (PEPCID) IVPB 20 mg premix (0 mg Intravenous Stopped 02/27/22 2109)  ciprofloxacin (CIPRO) IVPB 400 mg (0 mg Intravenous Stopped 02/27/22 2149)  iohexol (OMNIPAQUE) 300 MG/ML solution 75 mL (75 mLs Intravenous Contrast Given 02/27/22 2053)    ED Course/ Medical Decision Making/ A&P Clinical Course as of 02/27/22 2315  Mon Feb 27, 2022  2126 CT is consistent with cystitis, no evidence of pyelonephritis.  I have switched his antibiotic now to a flora quinolone as there may be some pseudomonal resistance to cephalosporins.  His UA continues to appear grossly positive.  I have ordered IV ciprofloxacin, nausea medications. [MT]    Clinical Course User Index [MT] Evan Osburn, MCarola Rhine MD                           Medical Decision  Making Amount and/or Complexity of Data Reviewed Labs: ordered. Radiology: ordered.  Risk Prescription drug management.   This patient presents to the ED with concern for nausea, vomiting. This involves an extensive number of treatment options, and is a complaint that carries  with it a high risk of complications and morbidity.  The differential diagnosis includes viral gastritis versus gastroenteritis versus gastroparesis versus other intra-abdominal process versus UTI or infection.  Additional history obtained from EMS  External records from outside source obtained and reviewed including prior outpatient hospital visit, perforated appendicitis  I ordered and personally interpreted labs.  The pertinent results include: UA with evidence of persistent infection.  CMP with mild chronic transaminitis, likely from vomiting.  No leukocytosis.  I ordered imaging studies including CT abdomen I independently visualized and interpreted imaging which showed bladder wall thickening consistent with cystitis, no other acute intra-abdominal emergency I agree with the radiologist interpretation   I ordered medication including for hydration, gastritis, nausea.  Ciprofloxacin as antibiotic for UTI.  Because he continues to have evidence of persistent urinary infection despite Rocephin given 2 days ago, I have switched him to a flora quinolone antibiotic will be awaiting sensitivity.  He will stop the cephalexin and start ciprofloxacin.  This may provide better coverage for Pseudomonas.  I have reviewed the patients home medicines and have made adjustments as needed  After the interventions noted above, I reevaluated the patient and found that they have: improved  He is tolerating fluids in the ED.  Social Determinants of Health: translator was used for entirety of her exam and history  Dispostion:  After consideration of the diagnostic results and the patients response to treatment, I feel that  the patent would benefit from outpatient follow up..         Final Clinical Impression(s) / ED Diagnoses Final diagnoses:  Cystitis  Nausea and vomiting, unspecified vomiting type    Rx / DC Orders ED Discharge Orders          Ordered    ciprofloxacin (CIPRO) 500 MG tablet  Every 12 hours        02/27/22 2313    ondansetron (ZOFRAN-ODT) 4 MG disintegrating tablet  Every 8 hours PRN        02/27/22 2313    promethazine (PHENERGAN) 25 MG suppository  Every 6 hours PRN        02/27/22 2313              Garrett Dusky, MD 02/27/22 2315

## 2022-02-27 NOTE — ED Triage Notes (Signed)
Pt c/o vomiting per translator [name]). Pt has not been taking his ABT due to nausea and vomiting. Pt has vomited more than 3 times today. Pt also c/o heartburn.

## 2022-02-27 NOTE — Discharge Instructions (Addendum)
Instructions -  Stephens has a urinary tract infection.  This can cause nausea and pain.  He was given an antibiotic in the ER 2 days ago but has not been.  However he has been vomiting and unable to keep down the antibiotic.  Therefore we will give him IV fluids and nausea medicine in the ER.  He was then able to swallow water and keep down medicine.  I provided a prescription for dissolving nausea medications called Zofran that you can give at home, and also rectal Phenergan suppositories which you can insert in the rectum if he is having worsening vomiting.  This should help him continue to drink water and take his medications.  I also switch his antibiotic from cephalexin that was prescribed on his last visit, to ciprofloxacin.  This is a different type of antibiotic, in case he is resistant to the last one. He will take ciprofloxacin instead.

## 2022-03-01 LAB — URINE CULTURE: Culture: >=100000 — AB

## 2022-03-02 ENCOUNTER — Telehealth (HOSPITAL_BASED_OUTPATIENT_CLINIC_OR_DEPARTMENT_OTHER): Payer: Self-pay | Admitting: Emergency Medicine

## 2022-03-02 ENCOUNTER — Ambulatory Visit: Payer: Medicaid Other | Admitting: Urology

## 2022-03-02 NOTE — Progress Notes (Unsigned)
Patient arrived to office appt. Here for ER follow up with abd pain. Patient began vomiting in waiting room. Patient recently seen in ER for UTI and difficulty keeping oral antibiotics down. Discussed with Dr. Annabell Howells patient advise to go to ER due to vomiting along with abdominal pain and recent UTI diagnosed. Staff with patient instructed to take patient to [facility] ER. Staff with patient voiced understanding.

## 2022-03-02 NOTE — Telephone Encounter (Signed)
Post ED Visit - Positive Culture Follow-up  Culture report reviewed by antimicrobial stewardship pharmacist: [facility] []  , Pharm.D. []  Enzo Bi, Pharm.D., BCPS AQ-ID []  , Pharm.D., BCPS []  Celedonio Miyamoto, Pharm.D., BCPS []  [place], Garvin Fila.D., BCPS, AAHIVP []  , Pharm.D., BCPS, AAHIVP []  Georgina Pillion, PharmD, BCPS []  , PharmD, BCPS []  [place], PharmD, BCPS []  [address], PharmD []  , PharmD, BCPS []  Estella Husk, PharmD  Pharmacy Team []  Lysle Pearl, PharmD []  , PharmD []  Phillips Climes, PharmD []  , Rph []  Agapito Games) , PharmD []  Verlan Friends, PharmD []  , PharmD []  Mervyn Gay, PharmD []  , PharmD []  Vinnie Level, PharmD []  [facility], PharmD []  , PharmD []  Len Childs, PharmD   Positive urine culture Treated with cipro and rocephin, organism sensitive to the same and no further patient follow-up is required at this time.  03/02/2022, 10:37 AM

## 2022-03-03 ENCOUNTER — Other Ambulatory Visit: Payer: Self-pay

## 2022-03-03 ENCOUNTER — Emergency Department (HOSPITAL_COMMUNITY): Payer: Medicaid Other

## 2022-03-03 ENCOUNTER — Inpatient Hospital Stay (HOSPITAL_COMMUNITY)
Admission: EM | Admit: 2022-03-03 | Discharge: 2022-03-10 | DRG: 690 | Disposition: A | Discharge status: Home / Self Care | Payer: Medicaid Other | Attending: Internal Medicine | Admitting: Internal Medicine

## 2022-03-03 ENCOUNTER — Encounter (HOSPITAL_COMMUNITY): Payer: Self-pay

## 2022-03-03 DIAGNOSIS — Z794 Long term (current) use of insulin: Secondary | ICD-10-CM

## 2022-03-03 DIAGNOSIS — Z7984 Long term (current) use of oral hypoglycemic drugs: Secondary | ICD-10-CM | POA: Diagnosis not present

## 2022-03-03 DIAGNOSIS — N3001 Acute cystitis with hematuria: Principal | ICD-10-CM | POA: Diagnosis present

## 2022-03-03 DIAGNOSIS — F32A Depression, unspecified: Secondary | ICD-10-CM | POA: Diagnosis present

## 2022-03-03 DIAGNOSIS — E11649 Type 2 diabetes mellitus with hypoglycemia without coma: Secondary | ICD-10-CM | POA: Diagnosis present

## 2022-03-03 DIAGNOSIS — R627 Adult failure to thrive: Secondary | ICD-10-CM | POA: Diagnosis present

## 2022-03-03 DIAGNOSIS — R7989 Other specified abnormal findings of blood chemistry: Secondary | ICD-10-CM | POA: Diagnosis present

## 2022-03-03 DIAGNOSIS — E43 Unspecified severe protein-calorie malnutrition: Secondary | ICD-10-CM | POA: Diagnosis not present

## 2022-03-03 DIAGNOSIS — Z681 Body mass index (BMI) 19 or less, adult: Secondary | ICD-10-CM

## 2022-03-03 DIAGNOSIS — F333 Major depressive disorder, recurrent, severe with psychotic symptoms: Secondary | ICD-10-CM | POA: Diagnosis present

## 2022-03-03 DIAGNOSIS — Z5329 Procedure and treatment not carried out because of patient's decision for other reasons: Secondary | ICD-10-CM | POA: Diagnosis present

## 2022-03-03 DIAGNOSIS — R5381 Other malaise: Secondary | ICD-10-CM | POA: Diagnosis not present

## 2022-03-03 DIAGNOSIS — E871 Hypo-osmolality and hyponatremia: Secondary | ICD-10-CM | POA: Diagnosis present

## 2022-03-03 DIAGNOSIS — D696 Thrombocytopenia, unspecified: Secondary | ICD-10-CM | POA: Diagnosis present

## 2022-03-03 DIAGNOSIS — R112 Nausea with vomiting, unspecified: Secondary | ICD-10-CM | POA: Diagnosis present

## 2022-03-03 DIAGNOSIS — R7401 Elevation of levels of liver transaminase levels: Secondary | ICD-10-CM | POA: Diagnosis not present

## 2022-03-03 DIAGNOSIS — B1910 Unspecified viral hepatitis B without hepatic coma: Secondary | ICD-10-CM | POA: Diagnosis not present

## 2022-03-03 DIAGNOSIS — F419 Anxiety disorder, unspecified: Secondary | ICD-10-CM | POA: Diagnosis present

## 2022-03-03 DIAGNOSIS — Z603 Acculturation difficulty: Secondary | ICD-10-CM | POA: Diagnosis present

## 2022-03-03 DIAGNOSIS — B965 Pseudomonas (aeruginosa) (mallei) (pseudomallei) as the cause of diseases classified elsewhere: Secondary | ICD-10-CM | POA: Diagnosis present

## 2022-03-03 DIAGNOSIS — R296 Repeated falls: Secondary | ICD-10-CM | POA: Diagnosis present

## 2022-03-03 DIAGNOSIS — K828 Other specified diseases of gallbladder: Secondary | ICD-10-CM | POA: Diagnosis present

## 2022-03-03 DIAGNOSIS — Z79899 Other long term (current) drug therapy: Secondary | ICD-10-CM | POA: Diagnosis not present

## 2022-03-03 DIAGNOSIS — E876 Hypokalemia: Secondary | ICD-10-CM | POA: Diagnosis present

## 2022-03-03 DIAGNOSIS — N390 Urinary tract infection, site not specified: Secondary | ICD-10-CM | POA: Diagnosis present

## 2022-03-03 DIAGNOSIS — E119 Type 2 diabetes mellitus without complications: Secondary | ICD-10-CM

## 2022-03-03 DIAGNOSIS — N3000 Acute cystitis without hematuria: Secondary | ICD-10-CM | POA: Diagnosis not present

## 2022-03-03 DIAGNOSIS — Z87891 Personal history of nicotine dependence: Secondary | ICD-10-CM | POA: Diagnosis not present

## 2022-03-03 DIAGNOSIS — B181 Chronic viral hepatitis B without delta-agent: Secondary | ICD-10-CM | POA: Diagnosis present

## 2022-03-03 DIAGNOSIS — B370 Candidal stomatitis: Secondary | ICD-10-CM | POA: Diagnosis present

## 2022-03-03 DIAGNOSIS — R1310 Dysphagia, unspecified: Secondary | ICD-10-CM | POA: Diagnosis present

## 2022-03-03 DIAGNOSIS — E1165 Type 2 diabetes mellitus with hyperglycemia: Secondary | ICD-10-CM

## 2022-03-03 HISTORY — DX: Altered mental status, unspecified: R41.82

## 2022-03-03 HISTORY — DX: Major depressive disorder, recurrent, severe with psychotic symptoms: F33.3

## 2022-03-03 HISTORY — DX: Other malaise: R53.81

## 2022-03-03 LAB — CBC
HCT: 41.8 % (ref 39.0–52.0)
Hemoglobin: 13.6 g/dL (ref 13.0–17.0)
MCH: 31.1 pg (ref 26.0–34.0)
MCHC: 32.5 g/dL (ref 30.0–36.0)
MCV: 95.4 fL (ref 80.0–100.0)
Platelets: 161 10*3/uL (ref 150–400)
RBC: 4.38 MIL/uL (ref 4.22–5.81)
RDW: 13.4 % (ref 11.5–15.5)
WBC: 6.9 10*3/uL (ref 4.0–10.5)
nRBC: 0 % (ref 0.0–0.2)

## 2022-03-03 LAB — URINALYSIS, ROUTINE W REFLEX MICROSCOPIC
Bilirubin Urine: NEGATIVE
Glucose, UA: NEGATIVE mg/dL
Ketones, ur: 20 mg/dL — AB
Nitrite: NEGATIVE
Protein, ur: 100 mg/dL — AB
Specific Gravity, Urine: 1.027 (ref 1.005–1.030)
WBC, UA: >50 WBC/hpf — ABNORMAL HIGH (ref 0–5)
pH: 5 (ref 5.0–8.0)

## 2022-03-03 LAB — COMPREHENSIVE METABOLIC PANEL
ALT: 155 U/L — ABNORMAL HIGH (ref 0–44)
AST: 151 U/L — ABNORMAL HIGH (ref 15–41)
Albumin: 3.3 g/dL — ABNORMAL LOW (ref 3.5–5.0)
Alkaline Phosphatase: 54 U/L (ref 38–126)
Anion gap: 9 (ref 5–15)
BUN: 17 mg/dL (ref 6–20)
CO2: 31 mmol/L (ref 22–32)
Calcium: 8.6 mg/dL — ABNORMAL LOW (ref 8.9–10.3)
Chloride: 104 mmol/L (ref 98–111)
Creatinine, Ser: 0.79 mg/dL (ref 0.61–1.24)
GFR, Estimated: >60 mL/min (ref >60)
Glucose, Bld: 99 mg/dL (ref 70–99)
Potassium: 3.5 mmol/L (ref 3.5–5.1)
Sodium: 144 mmol/L (ref 135–145)
Total Bilirubin: 1.6 mg/dL — ABNORMAL HIGH (ref 0.3–1.2)
Total Protein: 8.9 g/dL — ABNORMAL HIGH (ref 6.5–8.1)

## 2022-03-03 LAB — GLUCOSE, CAPILLARY: Glucose-Capillary: 69 mg/dL — ABNORMAL LOW (ref 70–99)

## 2022-03-03 LAB — CBG MONITORING, ED: Glucose-Capillary: 99 mg/dL (ref 70–99)

## 2022-03-03 MED ORDER — INSULIN ASPART 100 UNIT/ML IJ SOLN: 0.0000 [IU] | Freq: Three times a day (TID) | INTRAMUSCULAR | Status: DC

## 2022-03-03 MED ORDER — SODIUM CHLORIDE 0.9 % IV BOLUS
1000.0000 mL | Freq: Once | INTRAVENOUS | Status: AC
  Administered 2022-03-03: 1000 mL via INTRAVENOUS

## 2022-03-03 MED ORDER — GABAPENTIN 100 MG PO CAPS
100.0000 mg | ORAL_CAPSULE | Freq: Two times a day (BID) | ORAL | Status: DC
  Administered 2022-03-07 – 2022-03-10 (×7): 100 mg via ORAL
  Filled 2022-03-03 (×11): qty 1

## 2022-03-03 MED ORDER — ONDANSETRON HCL 4 MG/2ML IJ SOLN: 4.0000 mg | Freq: Four times a day (QID) | INTRAMUSCULAR | Status: DC | PRN

## 2022-03-03 MED ORDER — ESCITALOPRAM OXALATE 10 MG PO TABS
10.0000 mg | ORAL_TABLET | Freq: Every day | ORAL | Status: DC
  Administered 2022-03-07 – 2022-03-10 (×4): 10 mg via ORAL
  Filled 2022-03-03 (×7): qty 1

## 2022-03-03 MED ORDER — BENZTROPINE MESYLATE 1 MG PO TABS
0.5000 mg | ORAL_TABLET | Freq: Two times a day (BID) | ORAL | Status: DC
  Administered 2022-03-07 – 2022-03-10 (×7): 0.5 mg via ORAL
  Filled 2022-03-03 (×11): qty 1

## 2022-03-03 MED ORDER — PALIPERIDONE ER 3 MG PO TB24
3.0000 mg | ORAL_TABLET | Freq: Every day | ORAL | Status: DC
  Administered 2022-03-07 – 2022-03-09 (×3): 3 mg via ORAL
  Filled 2022-03-03 (×4): qty 1

## 2022-03-03 MED ORDER — TAMSULOSIN HCL 0.4 MG PO CAPS
0.4000 mg | ORAL_CAPSULE | Freq: Every day | ORAL | Status: DC
  Administered 2022-03-07 – 2022-03-10 (×4): 0.4 mg via ORAL
  Filled 2022-03-03 (×7): qty 1

## 2022-03-03 MED ORDER — LACTATED RINGERS IV SOLN: INTRAVENOUS | Status: DC

## 2022-03-03 MED ORDER — ONDANSETRON HCL 4 MG/2ML IJ SOLN
4.0000 mg | Freq: Four times a day (QID) | INTRAMUSCULAR | Status: DC | PRN
  Administered 2022-03-03: 4 mg via INTRAVENOUS
  Filled 2022-03-03 (×2): qty 2

## 2022-03-03 MED ORDER — INSULIN ASPART 100 UNIT/ML IJ SOLN: 0.0000 [IU] | Freq: Every day | INTRAMUSCULAR | Status: DC

## 2022-03-03 MED ORDER — ENOXAPARIN SODIUM 40 MG/0.4ML IJ SOSY
40.0000 mg | PREFILLED_SYRINGE | INTRAMUSCULAR | Status: DC
  Administered 2022-03-04 – 2022-03-10 (×6): 40 mg via SUBCUTANEOUS
  Filled 2022-03-03 (×7): qty 0.4

## 2022-03-03 MED ORDER — LORAZEPAM 0.5 MG PO TABS
0.5000 mg | ORAL_TABLET | Freq: Every day | ORAL | Status: DC
  Administered 2022-03-07 – 2022-03-09 (×2): 0.5 mg via ORAL
  Filled 2022-03-03 (×5): qty 1

## 2022-03-03 MED ORDER — LINAGLIPTIN 5 MG PO TABS
5.0000 mg | ORAL_TABLET | Freq: Every day | ORAL | Status: DC
  Filled 2022-03-03: qty 1

## 2022-03-03 MED ORDER — INSULIN GLARGINE-YFGN 100 UNIT/ML ~~LOC~~ SOLN
15.0000 [IU] | Freq: Every day | SUBCUTANEOUS | Status: DC
  Filled 2022-03-03 (×2): qty 0.15

## 2022-03-03 MED ORDER — ONDANSETRON HCL 4 MG PO TABS: 4.0000 mg | ORAL_TABLET | Freq: Four times a day (QID) | ORAL | Status: DC | PRN

## 2022-03-03 MED ORDER — LEVOFLOXACIN IN D5W 750 MG/150ML IV SOLN
750.0000 mg | INTRAVENOUS | Status: DC
  Administered 2022-03-03 – 2022-03-04 (×2): 750 mg via INTRAVENOUS
  Filled 2022-03-03 (×2): qty 150

## 2022-03-03 NOTE — ED Provider Notes (Signed)
[facility] EMERGENCY DEPARTMENT Provider Note   CSN: [id number] Arrival date & time: 03/03/22  1520  Patient speaks Garrett [name] and interpreter used, Surveyor, mining [phone])    History  Chief Complaint  Patient presents with   Nausea    Garrett [name] is a 36 y.o. male with past medical history diabetes, schizophrenia, major depressive disorder who presents to the ED complaining of continued nausea and vomiting since his previous visits this week where he was diagnosed with a urinary tract infection.  He was initially discharged on Keflex 11/25 which he did not tolerate secondary to nausea and vomiting and then was switched to Cipro at the second visit on 11/27 which he has also not tolerated, stating every time he takes the medication, attempts to eat, or attempts to drink he has recurrent vomiting. Complains of associated suprapubic abdominal pain, bilateral frontal headache, generalized weakness, and gross hematuria which started today. Denies fever, chills, dyspnea, dizziness, chest pain, back pain, diarrhea, syncope, or history of recurrent urinary tract infections.   Pt is alert, oriented except to time, and answers most questions appropriately, however, history limited as with pt's severe mental health history he is a very poor historian.    Home Medications Prior to Admission medications   Medication Sig Start Date End Date Taking? Authorizing Provider  Accu-Chek Softclix Lancets lancets Use to test blood sugars up to 4 times daily as directed. 07/11/21   Samella Parr, NP  acetaminophen (TYLENOL) 325 MG tablet Take 2 tablets (650 mg total) by mouth every 6 (six) hours as needed for mild pain or moderate pain. 07/11/21   Samella Parr, NP  benztropine (COGENTIN) 0.5 MG tablet Take 1 tablet (0.5 mg total) by mouth 2 (two) times daily. 07/11/21   Samella Parr, NP  Blood Glucose Monitoring Suppl (BLOOD GLUCOSE MONITOR SYSTEM) w/Device KIT Use to test blood sugars up to 4 times daily as directed.  07/11/21   Samella Parr, NP  ciprofloxacin (CIPRO) 500 MG tablet Take 1 tablet (500 mg total) by mouth every 12 (twelve) hours for 7 days. 02/28/22 03/07/22  Wyvonnia Dusky, MD  Continuous Blood Gluc Receiver (DEXCOM G6 RECEIVER) DEVI 1 Device by Does not apply route daily. 07/14/21   Ladell Pier, MD  Continuous Blood Gluc Sensor (DEXCOM G6 SENSOR) MISC 1 packet by Does not apply route daily. 07/14/21   Ladell Pier, MD  escitalopram (LEXAPRO) 10 MG tablet Take 1 tablet (10 mg total) by mouth daily. 07/12/21   Samella Parr, NP  feeding supplement, GLUCERNA SHAKE, (GLUCERNA SHAKE) LIQD Take 237 mLs by mouth 3 (three) times daily between meals. 07/11/21   Samella Parr, NP  gabapentin (NEURONTIN) 100 MG capsule Take 1 capsule (100 mg total) by mouth 2 (two) times daily. 07/11/21   Samella Parr, NP  glipiZIDE (GLUCOTROL) 5 MG tablet Take 1 tablet (5 mg total) by mouth daily before breakfast. 07/12/21   Samella Parr, NP  glucose blood (ACCU-CHEK GUIDE) test strip Use to check blood sugars up to 4 times daily as directed. 07/11/21   Samella Parr, NP  insulin aspart (NOVOLOG) 100 UNIT/ML FlexPen Inject 0-6 Units into the skin 3 (three) times daily with meals. 07/11/21   Samella Parr, NP  insulin aspart (NOVOLOG) 100 UNIT/ML FlexPen Inject 0-6 Units into the skin 3 (three) times daily with meals. 07/11/21   Samella Parr, NP  insulin glargine (LANTUS) 100 UNIT/ML Solostar Pen Inject 15 Units into  the skin daily. 07/12/21   Samella Parr, NP  insulin glargine (LANTUS) 100 UNIT/ML Solostar Pen Inject 15 Units into the skin daily. 07/12/21   Samella Parr, NP  Insulin Pen Needle 32G X 4 MM MISC Use to inject insulin up to 4 times daily. 07/11/21   Samella Parr, NP  linagliptin (TRADJENTA) 5 MG TABS tablet Take 1 tablet (5 mg total) by mouth daily. 07/12/21   Samella Parr, NP  LORazepam (ATIVAN) 0.5 MG tablet Take 1 tablet (0.5 mg total) by mouth at bedtime. 07/11/21    Samella Parr, NP  miconazole nitrate (MICATIN) POWD Apply 1 application. topically 3 (three) times daily. 07/11/21   Samella Parr, NP  midodrine (PROAMATINE) 10 MG tablet Take 1 tablet (10 mg total) by mouth 3 (three) times daily with meals. 07/11/21   Samella Parr, NP  ondansetron (ZOFRAN) 4 MG tablet Take 1 tablet (4 mg total) by mouth every 6 (six) hours. 02/25/22   Noemi Chapel, MD  ondansetron (ZOFRAN-ODT) 4 MG disintegrating tablet Take 1 tablet (4 mg total) by mouth every 8 (eight) hours as needed for up to 15 doses for nausea or vomiting. 02/27/22   Wyvonnia Dusky, MD  paliperidone (INVEGA) 3 MG 24 hr tablet Take 1 tablet (3 mg total) by mouth at bedtime. 07/11/21   Samella Parr, NP  promethazine (PHENERGAN) 25 MG suppository Place 1 suppository (25 mg total) rectally every 6 (six) hours as needed for up to 6 doses for nausea or vomiting. 02/27/22   Wyvonnia Dusky, MD  tamsulosin (FLOMAX) 0.4 MG CAPS capsule Take 1 capsule (0.4 mg total) by mouth daily. 02/09/22   Irine Seal, MD      Allergies    Patient has no known allergies.    Review of Systems   Review of Systems  Unable to perform ROS: Psychiatric disorder   See HPI for history able to be obtained.   Physical Exam Updated Vital Signs BP (!) 138/99   Pulse 77   Temp 98.7 F (37.1 C) (Oral)   Resp 11   Ht _0  (1.727 m)   Wt 49 kg   SpO2 100%   BMI 16.43 kg/m  Physical Exam Vitals and nursing note reviewed.  Constitutional:      General: He is not in acute distress.    Appearance: Normal appearance. He is ill-appearing (mildly) and diaphoretic (mildly). He is not toxic-appearing.     Comments: Moderate generalized weakness  HENT:     Head: Normocephalic and atraumatic.     Mouth/Throat:     Mouth: Mucous membranes are dry.     Pharynx: Oropharynx is clear. No oropharyngeal exudate or posterior oropharyngeal erythema.  Eyes:     General: No scleral icterus.    Extraocular Movements:  Extraocular movements intact.     Conjunctiva/sclera: Conjunctivae normal.  Cardiovascular:     Rate and Rhythm: Normal rate.     Heart sounds: Normal heart sounds. No murmur heard.    No gallop.  Pulmonary:     Effort: Pulmonary effort is normal. No respiratory distress.     Breath sounds: Normal breath sounds. No wheezing, rhonchi or rales.  Abdominal:     General: Abdomen is flat. There is no distension (mild suprapubic).     Palpations: Abdomen is soft.     Tenderness: There is abdominal tenderness. There is no guarding or rebound.  Musculoskeletal:        General:  Normal range of motion.     Cervical back: Normal range of motion and neck supple.     Right lower leg: No edema.     Left lower leg: No edema.  Skin:    General: Skin is warm.     Capillary Refill: Capillary refill takes less than 2 seconds.     Coloration: Skin is not jaundiced or pale.  Neurological:     General: No focal deficit present.     Mental Status: He is alert.     Cranial Nerves: No cranial nerve deficit.     Comments: Oriented to person and place but not to time, no focal motor deficits  Psychiatric:        Behavior: Behavior normal.     ED Results / Procedures / Treatments   Labs (all labs ordered are listed, but only abnormal results are displayed) Labs Reviewed  URINALYSIS, ROUTINE W REFLEX MICROSCOPIC - Abnormal; Notable for the following components:      Result Value   Color, Urine AMBER (*)    APPearance HAZY (*)    Hgb urine dipstick SMALL (*)    Ketones, ur 20 (*)    Protein, ur 100 (*)    Leukocytes,Ua MODERATE (*)    WBC, UA >50 (*)    Bacteria, UA RARE (*)    All other components within normal limits  COMPREHENSIVE METABOLIC PANEL - Abnormal; Notable for the following components:   Calcium 8.6 (*)    Total Protein 8.9 (*)    Albumin 3.3 (*)    AST 151 (*)    ALT 155 (*)    Total Bilirubin 1.6 (*)    All other components within normal limits  CBC  CBG MONITORING, ED     EKG EKG Interpretation  Date/Time:  Friday March 03 2022 15:41:45 EST Ventricular Rate:  86 PR Interval:  129 QRS Duration: 91 QT Interval:  378 QTC Calculation: 453 R Axis:   88 Text Interpretation: Sinus rhythm Confirmed by Noemi Chapel [phone]) on 03/03/2022 4:49:43 PM  Radiology DG Chest Port 1 View  Result Date: 03/03/2022 CLINICAL DATA:  Confusion. EXAM: PORTABLE CHEST 1 VIEW COMPARISON:  Radiographs 01/18/2022 and 01/14/2022. FINDINGS: 1651 hours. Enteric tube and right IJ central venous catheter have been removed in the interval. There is improved aeration of the lung bases which are clear. The heart size and mediastinal contours are normal. No acute osseous findings are evident. Telemetry leads overlie the chest. IMPRESSION: No evidence of acute cardiopulmonary process. Interval improved aeration of the lung bases. Electronically Signed   By: Richardean Sale M.D.   On: 03/03/2022 17:11    Procedures Normal sinus rhythm on cardiac monitor with oxygen saturation 97% and remainder of vital signs unremarkable  Medications Ordered in ED Medications  levofloxacin (LEVAQUIN) IVPB 750 mg (750 mg Intravenous New Bag/Given 03/03/22 1711)  ondansetron (ZOFRAN) injection 4 mg (has no administration in time range)  sodium chloride 0.9 % bolus 1,000 mL (has no administration in time range)  sodium chloride 0.9 % bolus 1,000 mL (0 mLs Intravenous Stopped 03/03/22 1809)    ED Course/ Medical Decision Making/ A&P                           Medical Decision Making Amount and/or Complexity of Data Reviewed Labs: ordered. Decision-making details documented in ED Course. Radiology: ordered. Decision-making details documented in ED Course.  Risk Prescription drug management. Decision regarding hospitalization.  36 year old male who returns to the ED due to intractable nausea and vomiting after failing 2 different antibiotics for the treatment of a urinary tract infection diagnosed  in the ED on 11/25.  Patient is in assisted living facility where he is able to receive assistance with his care due to significant psychiatric history.  Patient is calm and cooperative on exam though appears generally weak with suprapubic tenderness and still has Foley catheter in place which shows gross hematuria.  Patient has been on both Keflex and Cipro and unable to tolerate either of these with recurrent vomiting after taking antibiotics or after eating or drinking. Urine culture has returned with greater than 100,000 colony-forming units of Pseudomonas that is susceptible to fluoroquinolones.  Started on Levaquin 750 mg to treat this.  Liver enzymes do continue to go up when compared to previous 2 ED visits.  Remainder of workup today is unremarkable, however, patient will need to be admitted for further management of his acute cystitis due to failing outpatient treatment as he has limited capabilities of caring for himself in the outpatient setting and is unlikely to tolerate other PO meds  Hospitalist will be admitting patient.  Patient received 1 L of IV fluids while in the ED, will order 1 more liter, and have nurse trial Foley removal. Attending physician evaluated pt and agrees with plan.          Final Clinical Impression(s) / ED Diagnoses Final diagnoses:  Acute cystitis with hematuria  Intractable nausea and vomiting  Transaminitis    Rx / DC Orders ED Discharge Orders     None         Turner Daniels 03/03/22 Vonna Kotyk, MD 03/06/22 1057

## 2022-03-03 NOTE — ED Notes (Signed)
Cleaned patient catheter tubing at this time had dry crusted blood on it.

## 2022-03-03 NOTE — H&P (Signed)
History and Physical    PatientJaivon [name] EQA:[id number] DOB: Dec 08, 1985 DOA: 03/03/2022 DOS: the patient was seen and examined on 03/03/2022 PCP: Pcp, No  Patient coming from: SNF  Chief Complaint:  Chief Complaint  Patient presents with   Nausea   HPI: Garrett [name] is a 36 y.o. 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  History is obtained with use of interpreter.  Patient is a ward of the state and lives in a group home.  He was sent to the hospital due to nausea and vomiting.  He was diagnosed with a UTI about 5 days ago and was prescribed an antibiotic.  This antibiotic was changed to ciprofloxacin because the urine culture grew out Pseudomonas.  Unfortunately, every time he takes his antibiotic, he is unable to keep anything down and vomits.  His appetite has been poor.  He reports no fevers or chills.  He is come to the hospital for evaluation.  When he was evaluated prior, he was having a difficult time urinating.  A Foley catheter was placed, which has been indwelling.  Review of Systems: As mentioned in the history of present illness. All other systems reviewed and are negative. Past Medical History:  Diagnosis Date   Depression    Diabetes mellitus ([place])    Past Surgical History:  Procedure Laterality Date   APPENDECTOMY     EXPLORATORY LAPAROTOMY     Social History:  reports that he has quit smoking. His smoking use included cigarettes. He has never used smokeless tobacco. He reports that he does not currently use alcohol. He reports that he does not use drugs.  No Known Allergies  History reviewed. No pertinent family history.  Prior to Admission medications   Medication Sig Start Date End Date Taking? Authorizing Provider  Accu-Chek Softclix Lancets lancets Use to test blood sugars up to 4 times daily as directed. 07/11/21   Samella Parr, NP   acetaminophen (TYLENOL) 325 MG tablet Take 2 tablets (650 mg total) by mouth every 6 (six) hours as needed for mild pain or moderate pain. 07/11/21   Samella Parr, NP  benztropine (COGENTIN) 0.5 MG tablet Take 1 tablet (0.5 mg total) by mouth 2 (two) times daily. 07/11/21   Samella Parr, NP  Blood Glucose Monitoring Suppl (BLOOD GLUCOSE MONITOR SYSTEM) w/Device KIT Use to test blood sugars up to 4 times daily as directed. 07/11/21   Samella Parr, NP  ciprofloxacin (CIPRO) 500 MG tablet Take 1 tablet (500 mg total) by mouth every 12 (twelve) hours for 7 days. 02/28/22 03/07/22  Wyvonnia Dusky, MD  Continuous Blood Gluc Receiver (DEXCOM G6 RECEIVER) DEVI 1 Device by Does not apply route daily. 07/14/21   Ladell Pier, MD  Continuous Blood Gluc Sensor (DEXCOM G6 SENSOR) MISC 1 packet by Does not apply route daily. 07/14/21   Ladell Pier, MD  escitalopram (LEXAPRO) 10 MG tablet Take 1 tablet (10 mg total) by mouth daily. 07/12/21   Samella Parr, NP  feeding supplement, GLUCERNA SHAKE, (GLUCERNA SHAKE) LIQD Take 237 mLs by mouth 3 (three) times daily between meals. 07/11/21   Samella Parr, NP  gabapentin (NEURONTIN) 100 MG capsule Take 1 capsule (100 mg total) by mouth 2 (two) times daily. 07/11/21   Samella Parr, NP  glipiZIDE (GLUCOTROL) 5 MG tablet Take 1 tablet (5 mg total) by mouth daily before breakfast.  07/12/21   Samella Parr, NP  glucose blood (ACCU-CHEK GUIDE) test strip Use to check blood sugars up to 4 times daily as directed. 07/11/21   Samella Parr, NP  insulin aspart (NOVOLOG) 100 UNIT/ML FlexPen Inject 0-6 Units into the skin 3 (three) times daily with meals. 07/11/21   Samella Parr, NP  insulin aspart (NOVOLOG) 100 UNIT/ML FlexPen Inject 0-6 Units into the skin 3 (three) times daily with meals. 07/11/21   Samella Parr, NP  insulin glargine (LANTUS) 100 UNIT/ML Solostar Pen Inject 15 Units into the skin daily. 07/12/21   Samella Parr, NP   insulin glargine (LANTUS) 100 UNIT/ML Solostar Pen Inject 15 Units into the skin daily. 07/12/21   Samella Parr, NP  Insulin Pen Needle 32G X 4 MM MISC Use to inject insulin up to 4 times daily. 07/11/21   Samella Parr, NP  linagliptin (TRADJENTA) 5 MG TABS tablet Take 1 tablet (5 mg total) by mouth daily. 07/12/21   Samella Parr, NP  LORazepam (ATIVAN) 0.5 MG tablet Take 1 tablet (0.5 mg total) by mouth at bedtime. 07/11/21   Samella Parr, NP  miconazole nitrate (MICATIN) POWD Apply 1 application. topically 3 (three) times daily. 07/11/21   Samella Parr, NP  midodrine (PROAMATINE) 10 MG tablet Take 1 tablet (10 mg total) by mouth 3 (three) times daily with meals. 07/11/21   Samella Parr, NP  ondansetron (ZOFRAN) 4 MG tablet Take 1 tablet (4 mg total) by mouth every 6 (six) hours. 02/25/22   Noemi Chapel, MD  ondansetron (ZOFRAN-ODT) 4 MG disintegrating tablet Take 1 tablet (4 mg total) by mouth every 8 (eight) hours as needed for up to 15 doses for nausea or vomiting. 02/27/22   Wyvonnia Dusky, MD  paliperidone (INVEGA) 3 MG 24 hr tablet Take 1 tablet (3 mg total) by mouth at bedtime. 07/11/21   Samella Parr, NP  promethazine (PHENERGAN) 25 MG suppository Place 1 suppository (25 mg total) rectally every 6 (six) hours as needed for up to 6 doses for nausea or vomiting. 02/27/22   Wyvonnia Dusky, MD  tamsulosin (FLOMAX) 0.4 MG CAPS capsule Take 1 capsule (0.4 mg total) by mouth daily. 02/09/22   Irine Seal, MD    Physical Exam: Vitals:   03/03/22 1700 03/03/22 1730 03/03/22 1800 03/03/22 1830  BP: 112/88 (!) 120/91 (!) 130/94 (!) 138/99  Pulse: 86 81 78 77  Resp: _0 Temp:      TempSrc:      SpO2: 100% 100% 100% 100%  Weight:      Height:       General: Young male. Awake and alert and oriented x3. No acute cardiopulmonary distress.  HEENT: Normocephalic atraumatic.  Right and left ears normal in appearance.  Pupils equal, round, reactive to light.  Extraocular muscles are intact. Sclerae anicteric and noninjected.  Moist mucosal membranes. No mucosal lesions.  Neck: Neck supple without lymphadenopathy. No carotid bruits. No masses palpated.  Cardiovascular: Regular rate with normal S1-S2 sounds. No murmurs, rubs, gallops auscultated. No JVD.  Respiratory: Good respiratory effort with no wheezes, rales, rhonchi. Lungs clear to auscultation bilaterally.  No accessory muscle use. Abdomen: Soft, nontender, nondistended.  Midline abdominal scar which is well-healed and without erythema or evidence of breakdown.  Active bowel sounds. No masses or hepatosplenomegaly  Skin: No rashes, lesions, or ulcerations.  Dry, warm to touch. 2+ dorsalis pedis and radial pulses. Musculoskeletal:  No calf or leg pain. All major joints not erythematous nontender.  No upper or lower joint deformation.  Good ROM.  No contractures  Psychiatric: Intact judgment and insight. Pleasant and cooperative. Neurologic: No focal neurological deficits. Strength is 5/5 and symmetric in upper and lower extremities.  Cranial nerves II through XII are grossly intact.  Data Reviewed: Results for orders placed or performed during the hospital encounter of 03/03/22 (from the past 24 hour(s))  CBG monitoring, ED     Status: None   Collection Time: 03/03/22  3:32 PM  Result Value Ref Range   Glucose-Capillary 99 70 - 99 mg/dL  Urinalysis, Routine w reflex microscopic     Status: Abnormal   Collection Time: 03/03/22  3:45 PM  Result Value Ref Range   Color, Urine AMBER (A) YELLOW   APPearance HAZY (A) CLEAR   Specific Gravity, Urine 1.027 1.005 - 1.030   pH 5.0 5.0 - 8.0   Glucose, UA NEGATIVE NEGATIVE mg/dL   Hgb urine dipstick SMALL (A) NEGATIVE   Bilirubin Urine NEGATIVE NEGATIVE   Ketones, ur 20 (A) NEGATIVE mg/dL   Protein, ur 100 (A) NEGATIVE mg/dL   Nitrite NEGATIVE NEGATIVE   Leukocytes,Ua MODERATE (A) NEGATIVE   RBC / HPF 6-10 0 - 5 RBC/hpf   WBC, UA >50 (H) 0 - 5  WBC/hpf   Bacteria, UA RARE (A) NONE SEEN   Squamous Epithelial / LPF 0-5 0 - 5   Mucus PRESENT   CBC     Status: None   Collection Time: 03/03/22  5:20 PM  Result Value Ref Range   WBC 6.9 4.0 - 10.5 K/uL   RBC 4.38 4.22 - 5.81 MIL/uL   Hemoglobin 13.6 13.0 - 17.0 g/dL   HCT 41.8 39.0 - 52.0 %   MCV 95.4 80.0 - 100.0 fL   MCH 31.1 26.0 - 34.0 pg   MCHC 32.5 30.0 - 36.0 g/dL   RDW 13.4 11.5 - 15.5 %   Platelets 161 150 - 400 K/uL   nRBC 0.0 0.0 - 0.2 %  Comprehensive metabolic panel     Status: Abnormal   Collection Time: 03/03/22  5:20 PM  Result Value Ref Range   Sodium 144 135 - 145 mmol/L   Potassium 3.5 3.5 - 5.1 mmol/L   Chloride 104 98 - 111 mmol/L   CO2 31 22 - 32 mmol/L   Glucose, Bld 99 70 - 99 mg/dL   BUN 17 6 - 20 mg/dL   Creatinine, Ser 0.79 0.61 - 1.24 mg/dL   Calcium 8.6 (L) 8.9 - 10.3 mg/dL   Total Protein 8.9 (H) 6.5 - 8.1 g/dL   Albumin 3.3 (L) 3.5 - 5.0 g/dL   AST 151 (H) 15 - 41 U/L   ALT 155 (H) 0 - 44 U/L   Alkaline Phosphatase 54 38 - 126 U/L   Total Bilirubin 1.6 (H) 0.3 - 1.2 mg/dL   GFR, Estimated >60 >60 mL/min   Anion gap 9 5 - 15   DG Chest Port 1 View  Result Date: 03/03/2022 CLINICAL DATA:  Confusion. EXAM: PORTABLE CHEST 1 VIEW COMPARISON:  Radiographs 01/18/2022 and 01/14/2022. FINDINGS: 1651 hours. Enteric tube and right IJ central venous catheter have been removed in the interval. There is improved aeration of the lung bases which are clear. The heart size and mediastinal contours are normal. No acute osseous findings are evident. Telemetry leads overlie the chest. IMPRESSION: No evidence of acute cardiopulmonary process. Interval improved aeration of  the lung bases. Electronically Signed   By: Richardean Sale M.D.   On: 03/03/2022 17:11     Assessment and Plan: No notes have been filed under this hospital service. Service: Hospitalist  Principal Problem:   UTI (urinary tract infection) Active Problems:   MDD (major depressive  disorder), recurrent, severe, with psychosis ([place])   Controlled type 2 diabetes mellitus without complication, with long-term current use of insulin ([facility])   Elevated LFTs   Chronic Hepatitis B  UTI Inadequately treated Levofloxacin CBC tomorrow Urinary retention Flomax Will remove Foley catheter, particular because the patient has Pseudomonas UTI. We will do voiding trial with postvoid residual or bladder scan if no urinary output Elevated LFTs with History of chronic hep B Check hepatitis panel and viral load Check right upper quadrant ultrasound CMP tomorrow Diabetes Continue long-acting insulin.  Hold oral medications Sliding scale insulin Psychiatric We will continue antipsychotic and antidepressant   Advance Care Planning:   Code Status: Prior full code  Consults: None  Family Communication: None  Severity of Illness: The appropriate patient status for this patient is INPATIENT. Inpatient status is judged to be reasonable and necessary in order to provide the required intensity of service to ensure the patient's safety. The patient's presenting symptoms, physical exam findings, and initial radiographic and laboratory data in the context of their chronic comorbidities is felt to place them at high risk for further clinical deterioration. Furthermore, it is not anticipated that the patient will be medically stable for discharge from the hospital within 2 midnights of admission.   * I certify that at the point of admission it is my clinical judgment that the patient will require inpatient hospital care spanning beyond 2 midnights from the point of admission due to high intensity of service, high risk for further deterioration and high frequency of surveillance required.*  Author: Truett Mainland, DO 03/03/2022 8:27 PM  For on call review [email or web address].

## 2022-03-03 NOTE — ED Triage Notes (Signed)
Patient here via EMS from group home for fourth visit this week for complaints of N/V. When asked how long this has been going on he states for a long time. Noted with surgical scar to abdomen, cannot verbalize when or what kind of sx he had. Foley cath present, urine dark in color, per patient this is normal. CBG 99. Interpreter Darl Pikes [phone] used for triage.

## 2022-03-04 ENCOUNTER — Inpatient Hospital Stay (HOSPITAL_COMMUNITY): Payer: Medicaid Other

## 2022-03-04 DIAGNOSIS — R7989 Other specified abnormal findings of blood chemistry: Secondary | ICD-10-CM

## 2022-03-04 DIAGNOSIS — F333 Major depressive disorder, recurrent, severe with psychotic symptoms: Secondary | ICD-10-CM

## 2022-03-04 DIAGNOSIS — N3000 Acute cystitis without hematuria: Secondary | ICD-10-CM | POA: Diagnosis not present

## 2022-03-04 LAB — HEPATITIS PANEL, ACUTE
HCV Ab: NONREACTIVE
Hep A IgM: NONREACTIVE
Hep B C IgM: NONREACTIVE
Hepatitis B Surface Ag: REACTIVE — AB

## 2022-03-04 LAB — COMPREHENSIVE METABOLIC PANEL
ALT: 109 U/L — ABNORMAL HIGH (ref 0–44)
AST: 101 U/L — ABNORMAL HIGH (ref 15–41)
Albumin: 2.6 g/dL — ABNORMAL LOW (ref 3.5–5.0)
Alkaline Phosphatase: 40 U/L (ref 38–126)
Anion gap: 7 (ref 5–15)
BUN: 12 mg/dL (ref 6–20)
CO2: 26 mmol/L (ref 22–32)
Calcium: 7.5 mg/dL — ABNORMAL LOW (ref 8.9–10.3)
Chloride: 109 mmol/L (ref 98–111)
Creatinine, Ser: 0.6 mg/dL — ABNORMAL LOW (ref 0.61–1.24)
GFR, Estimated: >60 mL/min (ref >60)
Glucose, Bld: 73 mg/dL (ref 70–99)
Potassium: 3.1 mmol/L — ABNORMAL LOW (ref 3.5–5.1)
Sodium: 142 mmol/L (ref 135–145)
Total Bilirubin: 1.4 mg/dL — ABNORMAL HIGH (ref 0.3–1.2)
Total Protein: 6.8 g/dL (ref 6.5–8.1)

## 2022-03-04 LAB — GLUCOSE, CAPILLARY
Glucose-Capillary: 81 mg/dL (ref 70–99)
Glucose-Capillary: 66 mg/dL — ABNORMAL LOW (ref 70–99)
Glucose-Capillary: 140 mg/dL — ABNORMAL HIGH (ref 70–99)
Glucose-Capillary: 105 mg/dL — ABNORMAL HIGH (ref 70–99)
Glucose-Capillary: 91 mg/dL (ref 70–99)
Glucose-Capillary: 118 mg/dL — ABNORMAL HIGH (ref 70–99)

## 2022-03-04 MED ORDER — DEXTROSE 50 % IV SOLN
12.5000 g | INTRAVENOUS | Status: AC
  Administered 2022-03-04: 12.5 g via INTRAVENOUS
  Filled 2022-03-04: qty 50

## 2022-03-04 MED ORDER — DEXTROSE 5 % IV SOLN: INTRAVENOUS | Status: DC

## 2022-03-04 MED ORDER — ONDANSETRON HCL 4 MG/2ML IJ SOLN
4.0000 mg | Freq: Four times a day (QID) | INTRAMUSCULAR | Status: DC
  Administered 2022-03-04 – 2022-03-07 (×9): 4 mg via INTRAVENOUS
  Filled 2022-03-04 (×11): qty 2

## 2022-03-04 MED ORDER — POTASSIUM CHLORIDE CRYS ER 20 MEQ PO TBCR
40.0000 meq | EXTENDED_RELEASE_TABLET | ORAL | Status: AC
  Filled 2022-03-04 (×2): qty 2

## 2022-03-04 MED ORDER — METOCLOPRAMIDE HCL 5 MG/ML IJ SOLN
10.0000 mg | Freq: Four times a day (QID) | INTRAMUSCULAR | Status: DC | PRN
  Administered 2022-03-06: 10 mg via INTRAVENOUS
  Filled 2022-03-04: qty 2

## 2022-03-04 NOTE — Progress Notes (Signed)
Pt complains with dizziness and blurred vision. BS 66. MD informed. Orders placed for D5 IV continuously as well as 12.5g of D 50 given. Patient states blurry vision and dizziness better after administration. Blood sugar increased to 140. Pt states he felt a little better. One episode of vomiting after attempt to give him some juice, pt held liquid in his mouth and proceeded to vomit. Pt also complains with abdominal pain on palpation and has not urinated. Bladder scanner showed > 200 cc in bladder. MD informed. In and out cath done with [facility] with 100 cc removed from bladder. Patient states some relief felt. Speech consulted, made NPO until their assessment. Pt not tolerating anything PO safely. With attempts of sips of water and juice patient held in his mouth and then vomited thick mucus and juice up. MD informed. Bed in lowest position bed alarm on will continue to do frequent monitoring on patient.

## 2022-03-04 NOTE — Progress Notes (Signed)
Patient drowsy sleepy upon entering room. Interpretor called to let patient know we are bladder scanning again. Patient has >200cc again since last in and out cath. MD informed. Orders to place a foley cath. Patient tolerated well. Will continue to monitor.

## 2022-03-04 NOTE — Progress Notes (Signed)
PROGRESS NOTE    Garrett [name]  EHU:[id number] DOB: [birth year] DOA: 03/03/2022 PCP: Pcp, No   Brief Narrative:   36 y.o. 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  On presentation, chest x-ray was negative for acute abnormality.  He was started on IV Levaquin.  Assessment & Plan:   UTI -Recent diagnosis of Pseudomonas UTI as an outpatient for which she was placed on oral ciprofloxacin which led to nausea. -Repeat urine culture.  Continue IV Levaquin.  Nausea -Possibly medication related.  Continue IV antiemetics as needed  Urinary retention -Patient had recent urinary retention requiring Foley catheter placement which was removed as an outpatient by urology.  He had Foley catheter placement again which was removed in the ED on presentation.  Continue Flomax.  Outpatient follow-up with urology  Elevated LFTs History of chronic hep B -LFTs improving.  Follow right upper quadrant ultrasound.  Outpatient follow-up with ID  Hypokalemia Replace.  Repeat a.m. labs  Thrombocytopenia Questionable cause.  Repeat a.m. labs  Diabetes mellitus type 2 with hypoglycemia -Blood sugars on the lower side.  DC long-acting insulin.  Continue CBGs with SSI.  Encourage oral intake.  History of psychosis and depression -Continue benztropine, escitalopram, gabapentin, paliperidone, lorazepam.  Outpatient follow-up with psychiatry   DVT prophylaxis: Lovenox Code Status: Full Family Communication: None at bedside Disposition Plan: Status is: Inpatient Remains inpatient appropriate because: Of severity of illness    Consultants: None  Procedures:  None  Antimicrobials: Levaquin from 03/03/2022 onwards   Subjective: Patient seen and examined at bedside.  Sleepy, wakes up slightly, very poor historian.  No fever, agitation, seizures reported.  Objective: Vitals:   03/03/22 2307 03/03/22 2308 03/04/22 0332 03/04/22 0820  BP: (!) 141/106  (!) 132/95 (!) 138/100  Pulse: 78  79 85  Resp: 15     Temp: 97.9 F (36.6 C)  98.1 F (36.7 C) 97.8 F (36.6 C)  TempSrc: Oral  Oral   SpO2: 100% 100% 100% 100%  Weight:      Height:        Intake/Output Summary (Last 24 hours) at 03/04/2022 0923 Last data filed at 03/04/2022 0600 Gross per 24 hour  Intake 240 ml  Output 525 ml  Net -285 ml   Filed Weights   03/03/22 1540  Weight: 49 kg    Examination:  General exam: Appears calm and comfortable.  Chronically ill and deconditioned.  On room air. Respiratory system: Bilateral decreased breath sounds at bases with some scattered crackles Cardiovascular system: S1 & S2 heard, Rate controlled Gastrointestinal system: Abdomen is nondistended, soft and nontender. Normal bowel sounds heard. Extremities: No cyanosis, clubbing, edema  Central nervous system: Sleepy but wakes up slightly, hardly participates in any conversation.  No focal neurological deficits. Moving extremities Skin: No rashes, lesions or ulcers Psychiatry: Flat affect.  Not agitated.   Data Reviewed: I have personally reviewed following labs and imaging studies  CBC: Recent Labs  Lab 02/25/22 2034 02/27/22 1837 03/03/22 1720 03/04/22 0413  WBC 12.2* 7.8 6.9 6.1  NEUTROABS 7.3  --   --   --   HGB 14.4 13.4 13.6 10.8*  HCT 43.9 41.6 41.8 33.1*  MCV 94.6 96.1 95.4 95.7  PLT 259 197 161 122*  Basic Metabolic Panel: Recent Labs  Lab 02/25/22 2034 02/27/22 1837 03/03/22 1720 03/04/22 0413  NA 144 145 144 142  K 3.7 3.6 3.5 3.1*  CL 106 108 104 109  CO2 26 26 31 26   GLUCOSE 96 97 99 73  BUN 24* 18 17 12   CREATININE 1.02 0.83 0.79 0.60*  CALCIUM 9.4  8.9 8.6* 7.5*  MG 2.4  --   --   --    GFR: Estimated Creatinine Clearance: 86.8 mL/min (A) (by C-G formula based on SCr of 0.6 mg/dL (L)). Liver Function Tests: Recent Labs  Lab 02/25/22 2034 02/27/22 1837 03/03/22 1720 03/04/22 0413  AST 108* 99* 151* 101*  ALT 137* 109* 155* 109*  ALKPHOS 68 59 54 40  BILITOT 1.8* 2.1* 1.6* 1.4*  PROT 9.7* 9.0* 8.9* 6.8  ALBUMIN 3.6 3.4* 3.3* 2.6*   Recent Labs  Lab 02/25/22 2034 02/27/22 1837  LIPASE 43 51   No results for input(s): "AMMONIA" in the last 168 hours. Coagulation Profile: No results for input(s): "INR", "PROTIME" in the last 168 hours. Cardiac Enzymes: No results for input(s): "CKTOTAL", "CKMB", "CKMBINDEX", "TROPONINI" in the last 168 hours. BNP (last 3 results) No results for input(s): "PROBNP" in the last 8760 hours. HbA1C: No results for input(s): "HGBA1C" in the last 72 hours. CBG: Recent Labs  Lab 03/03/22 1532 03/03/22 2313  GLUCAP 99 69*   Lipid Profile: No results for input(s): "CHOL", "HDL", "LDLCALC", "TRIG", "CHOLHDL", "LDLDIRECT" in the last 72 hours. Thyroid Function Tests: No results for input(s): "TSH", "T4TOTAL", "FREET4", "T3FREE", "THYROIDAB" in the last 72 hours. Anemia Panel: No results for input(s): "VITAMINB12", "FOLATE", "FERRITIN", "TIBC", "IRON", "RETICCTPCT" in the last 72 hours. Sepsis Labs: Recent Labs  Lab 02/25/22 2034 02/25/22 2255  LATICACIDVEN 2.1* 2.2*    Recent Results (from the past 240 hour(s))  Urine Culture     Status: Abnormal   Collection Time: 02/25/22 10:42 PM   Specimen: Urine, Catheterized  Result Value Ref Range Status   Specimen Description   Final    URINE, CATHETERIZED Performed at [facility], [address], [place], [address] [place]    Special Requests   Final    NONE Performed at [facility], [address], [place], [address] [place]    Culture (A)  Final    >=100,000 COLONIES/mL PSEUDOMONAS AERUGINOSA 20,000 COLONIES/mL STAPHYLOCOCCUS  AUREUS    Report Status 03/01/2022 FINAL  Final   Organism ID, Bacteria PSEUDOMONAS AERUGINOSA (A)  Final   Organism ID, Bacteria STAPHYLOCOCCUS AUREUS (A)  Final      Susceptibility   Pseudomonas aeruginosa - MIC*    CEFTAZIDIME 16 INTERMEDIATE Intermediate     CIPROFLOXACIN <=0.25 SENSITIVE Sensitive     GENTAMICIN <=1 SENSITIVE Sensitive     IMIPENEM 2 SENSITIVE Sensitive     * >=100,000 COLONIES/mL PSEUDOMONAS AERUGINOSA   Staphylococcus aureus - MIC*    CIPROFLOXACIN <=0.5 SENSITIVE Sensitive     GENTAMICIN <=0.5 SENSITIVE Sensitive     NITROFURANTOIN <=16 SENSITIVE Sensitive     OXACILLIN 0.5 SENSITIVE Sensitive     TETRACYCLINE <=1 SENSITIVE Sensitive     VANCOMYCIN <=0.5 SENSITIVE Sensitive     TRIMETH/SULFA <=10 SENSITIVE Sensitive     CLINDAMYCIN <=0.25 SENSITIVE Sensitive     RIFAMPIN <=0.5 SENSITIVE Sensitive     Inducible Clindamycin NEGATIVE Sensitive     * 20,000 COLONIES/mL STAPHYLOCOCCUS AUREUS         Radiology Studies: DG Chest Port 1 View  Result Date:  03/03/2022 CLINICAL DATA:  Confusion. EXAM: PORTABLE CHEST 1 VIEW COMPARISON:  Radiographs 01/18/2022 and 01/14/2022. FINDINGS: 1651 hours. Enteric tube and right IJ central venous catheter have been removed in the interval. There is improved aeration of the lung bases which are clear. The heart size and mediastinal contours are normal. No acute osseous findings are evident. Telemetry leads overlie the chest. IMPRESSION: No evidence of acute cardiopulmonary process. Interval improved aeration of the lung bases. Electronically Signed   By: Carey Bullocks M.D.   On: 03/03/2022 17:11        Scheduled Meds:  benztropine  0.5 mg Oral BID   enoxaparin (LOVENOX) injection  40 mg Subcutaneous Q24H   escitalopram  10 mg Oral Daily   gabapentin  100 mg Oral BID   insulin aspart  0-15 Units Subcutaneous TID WC   insulin aspart  0-5 Units Subcutaneous QHS   insulin glargine-yfgn  15 Units Subcutaneous QHS    linagliptin  5 mg Oral Daily   LORazepam  0.5 mg Oral QHS   paliperidone  3 mg Oral QHS   potassium chloride  40 mEq Oral Q4H   tamsulosin  0.4 mg Oral Daily   Continuous Infusions:  lactated ringers 100 mL/hr at 03/04/22 0000   levofloxacin (LEVAQUIN) IV Stopped (03/03/22 1841)          Glade Lloyd, MD [facility] 03/04/2022, 9:23 AM

## 2022-03-05 DIAGNOSIS — N3000 Acute cystitis without hematuria: Secondary | ICD-10-CM | POA: Diagnosis not present

## 2022-03-05 DIAGNOSIS — Z794 Long term (current) use of insulin: Secondary | ICD-10-CM

## 2022-03-05 DIAGNOSIS — F333 Major depressive disorder, recurrent, severe with psychotic symptoms: Secondary | ICD-10-CM | POA: Diagnosis not present

## 2022-03-05 DIAGNOSIS — E119 Type 2 diabetes mellitus without complications: Secondary | ICD-10-CM

## 2022-03-05 DIAGNOSIS — R7989 Other specified abnormal findings of blood chemistry: Secondary | ICD-10-CM | POA: Diagnosis not present

## 2022-03-05 LAB — COMPREHENSIVE METABOLIC PANEL
ALT: 101 U/L — ABNORMAL HIGH (ref 0–44)
AST: 100 U/L — ABNORMAL HIGH (ref 15–41)
Albumin: 2.5 g/dL — ABNORMAL LOW (ref 3.5–5.0)
Alkaline Phosphatase: 39 U/L (ref 38–126)
Anion gap: 5 (ref 5–15)
BUN: 6 mg/dL (ref 6–20)
CO2: 29 mmol/L (ref 22–32)
Calcium: 7.5 mg/dL — ABNORMAL LOW (ref 8.9–10.3)
Chloride: 98 mmol/L (ref 98–111)
Creatinine, Ser: 0.62 mg/dL (ref 0.61–1.24)
GFR, Estimated: >60 mL/min (ref >60)
Glucose, Bld: 131 mg/dL — ABNORMAL HIGH (ref 70–99)
Potassium: 2.6 mmol/L — CL (ref 3.5–5.1)
Sodium: 132 mmol/L — ABNORMAL LOW (ref 135–145)
Total Bilirubin: 2 mg/dL — ABNORMAL HIGH (ref 0.3–1.2)
Total Protein: 6.7 g/dL (ref 6.5–8.1)

## 2022-03-05 LAB — URINE CULTURE: Culture: NO GROWTH

## 2022-03-05 LAB — CBC WITH DIFFERENTIAL/PLATELET
Abs Immature Granulocytes: 0.02 10*3/uL (ref 0.00–0.07)
Basophils Absolute: 0 10*3/uL (ref 0.0–0.1)
Basophils Relative: 0 %
Eosinophils Absolute: 0.2 10*3/uL (ref 0.0–0.5)
Eosinophils Relative: 4 %
HCT: 33.7 % — ABNORMAL LOW (ref 39.0–52.0)
Hemoglobin: 11.2 g/dL — ABNORMAL LOW (ref 13.0–17.0)
Immature Granulocytes: 0 %
Lymphocytes Relative: 26 %
Lymphs Abs: 1.3 10*3/uL (ref 0.7–4.0)
MCH: 30.4 pg (ref 26.0–34.0)
MCHC: 33.2 g/dL (ref 30.0–36.0)
MCV: 91.3 fL (ref 80.0–100.0)
Monocytes Absolute: 0.5 10*3/uL (ref 0.1–1.0)
Monocytes Relative: 11 %
Neutro Abs: 3.1 10*3/uL (ref 1.7–7.7)
Neutrophils Relative %: 59 %
Platelets: 127 10*3/uL — ABNORMAL LOW (ref 150–400)
RBC: 3.69 MIL/uL — ABNORMAL LOW (ref 4.22–5.81)
RDW: 13 % (ref 11.5–15.5)
WBC: 5.1 10*3/uL (ref 4.0–10.5)
nRBC: 0 % (ref 0.0–0.2)

## 2022-03-05 LAB — MAGNESIUM: Magnesium: 1.8 mg/dL (ref 1.7–2.4)

## 2022-03-05 LAB — GLUCOSE, CAPILLARY
Glucose-Capillary: 142 mg/dL — ABNORMAL HIGH (ref 70–99)
Glucose-Capillary: 140 mg/dL — ABNORMAL HIGH (ref 70–99)
Glucose-Capillary: 127 mg/dL — ABNORMAL HIGH (ref 70–99)
Glucose-Capillary: 111 mg/dL — ABNORMAL HIGH (ref 70–99)

## 2022-03-05 MED ORDER — KCL IN DEXTROSE-NACL 40-5-0.9 MEQ/L-%-% IV SOLN: INTRAVENOUS | Status: DC

## 2022-03-05 MED ORDER — POTASSIUM CHLORIDE 10 MEQ/100ML IV SOLN
INTRAVENOUS | Status: AC
  Filled 2022-03-05: qty 100

## 2022-03-05 MED ORDER — POTASSIUM CHLORIDE 10 MEQ/100ML IV SOLN
10.0000 meq | INTRAVENOUS | Status: AC
  Administered 2022-03-05 (×6): 10 meq via INTRAVENOUS
  Filled 2022-03-05 (×5): qty 100

## 2022-03-05 MED ORDER — SODIUM CHLORIDE 0.9 % IV SOLN
1.0000 g | Freq: Three times a day (TID) | INTRAVENOUS | Status: AC
  Administered 2022-03-05 – 2022-03-09 (×12): 1 g via INTRAVENOUS
  Filled 2022-03-05 (×12): qty 20

## 2022-03-05 NOTE — Progress Notes (Signed)
PROGRESS NOTE    Garrett [name]  IV:[id number] DOB: 1986-03-28 DOA: 03/03/2022 PCP: Pcp, No   Brief Narrative:   36 y.o. 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  On presentation, chest x-ray was negative for acute abnormality.  He was started on IV Levaquin.  Assessment & Plan:   UTI -Recent diagnosis of Pseudomonas UTI as an outpatient for which she was placed on oral ciprofloxacin which led to nausea. -Follow-up urine culture.  Switch IV Levaquin to meropenem because of persistent nausea  Nausea -Possibly medication related.  Currently on round-the-clock Zofran and as needed Reglan.  Still having intermittent nausea and vomiting.  Plan as above.  Urinary retention -Patient had recent urinary retention requiring Foley catheter placement which was removed as an outpatient by urology.  He had Foley catheter placement again which was removed in the ED on presentation.  Continue Flomax.   -Continue to have issues with urinary retention requiring in and out catheterization few times and subsequently Foley catheter was placed on 03/04/2022.  Outpatient follow-up with urology.  Elevated LFTs History of chronic hep B -LFTs improving.  Follow right upper quadrant ultrasound.  Outpatient follow-up with ID  Hypokalemia -Replace.  Repeat a.m. labs  Thrombocytopenia -Questionable cause.  Repeat a.m. labs  Diabetes mellitus type 2 with hypoglycemia -Blood sugars on the lower side.  DC'd long-acting insulin.  Currently on D5 drip. -Currently n.p.o.  Dysphagia -Questionable cause.  Currently NPO.  SLP eval.  History of psychosis and depression -Continue  benztropine, escitalopram, gabapentin, paliperidone, lorazepam if able to swallow.  Outpatient follow-up with psychiatry   DVT prophylaxis: Lovenox Code Status: Full Family Communication: None at bedside Disposition Plan: Status is: Inpatient Remains inpatient appropriate because: Of severity of illness    Consultants: None  Procedures: None  Antimicrobials: Levaquin from 03/03/2022 onwards   Subjective: Patient seen and examined at bedside.  Wakes up only very slightly, hardly participates in any conversation.  Very poor historian.  Nursing staff reports few attempts of and out catheterization on 03/04/2022 followed by Foley catheter placement.  Patient has had very poor oral intake and intermittent vomiting.  No fever or agitation reported. Objective: Vitals:   03/04/22 0332 03/04/22 0820 03/04/22 2112 03/05/22 0542  BP: (!) 132/95 (!) 138/100 (!) 137/93 (!) 84/58  Pulse: 79 85 79 79  Resp:   19 18  Temp: 98.1 F (36.7 C) 97.8 F (36.6 C) 98.4 F (36.9 C) 98 F (36.7 C)  TempSrc: Oral  Oral   SpO2: 100% 100% 100% 100%  Weight:      Height:        Intake/Output Summary (Last 24 hours) at 03/05/2022 0809 Last data filed at 03/04/2022 1600 Gross per 24 hour  Intake 1378.83 ml  Output 200 ml  Net 1178.83 ml    Filed Weights   03/03/22 1540  Weight: 49 kg    Examination:  General: On room air.  No distress.  Looks chronically ill and deconditioned. ENT/neck: No thyromegaly.  JVD is not elevated  respiratory: Decreased breath sounds at bases bilaterally with some crackles; no wheezing  CVS: S1-S2 heard, rate controlled currently Abdominal: Soft, nontender, slightly distended; no organomegaly,  bowel sounds are heard Extremities: Trace lower extremity edema; no cyanosis  CNS: Sleepy, wakes up only very slightly, hardly participates in any conversation.  Very poor historian.   No focal neurologic deficit.  Moves extremities Lymph: No obvious lymphadenopathy Skin: No  obvious ecchymosis/lesions  psych: Very flat affect.  No signs of agitation currently.   Musculoskeletal: No obvious joint swelling/deformity    Data Reviewed: I have personally reviewed following labs and imaging studies  CBC: Recent Labs  Lab 02/27/22 1837 03/03/22 1720 03/04/22 0413 03/05/22 0600  WBC 7.8 6.9 6.1 5.1  NEUTROABS  --   --   --  3.1  HGB 13.4 13.6 10.8* 11.2*  HCT 41.6 41.8 33.1* 33.7*  MCV 96.1 95.4 95.7 91.3  PLT 197 161 122* 127*    Basic Metabolic Panel: Recent Labs  Lab 02/27/22 1837 03/03/22 1720 03/04/22 0413 03/05/22 0600  NA 145 144 142 132*  K 3.6 3.5 3.1* 2.6*  CL 108 104 109 98  CO2 26 31 26 29   GLUCOSE 97 99 73 131*  BUN 18 17 12 6   CREATININE 0.83 0.79 0.60* 0.62  CALCIUM 8.9 8.6* 7.5* 7.5*  MG  --   --   --  1.8    GFR: Estimated Creatinine Clearance: 86.8 mL/min (by C-G formula based on SCr of 0.62 mg/dL). Liver Function Tests: Recent Labs  Lab 02/27/22 1837 03/03/22 1720 03/04/22 0413 03/05/22 0600  AST 99* 151* 101* 100*  ALT 109* 155* 109* 101*  ALKPHOS 59 54 40 39  BILITOT 2.1* 1.6* 1.4* 2.0*  PROT 9.0* 8.9* 6.8 6.7  ALBUMIN 3.4* 3.3* 2.6* 2.5*    Recent Labs  Lab 02/27/22 1837  LIPASE 51    No results for input(s): "AMMONIA" in the last 168 hours. Coagulation Profile: No results for input(s): "INR", "PROTIME" in the last 168 hours. Cardiac Enzymes: No results for input(s): "CKTOTAL", "CKMB", "CKMBINDEX", "TROPONINI" in the last 168 hours. BNP (last 3 results) No results for input(s): "PROBNP" in the last 8760 hours. HbA1C: No results for input(s): "HGBA1C" in the last 72 hours. CBG: Recent Labs  Lab 03/04/22 1229 03/04/22 1329 03/04/22 1627 03/04/22 1807 03/04/22 2114  GLUCAP 66* 140* 105* 91 118*    Lipid Profile: No results for input(s): "CHOL", "HDL", "LDLCALC", "TRIG", "CHOLHDL", "LDLDIRECT" in the last 72 hours. Thyroid Function Tests: No results for input(s): "TSH", "T4TOTAL", "FREET4",  "T3FREE", "THYROIDAB" in the last 72 hours. Anemia Panel: No results for input(s): "VITAMINB12", "FOLATE", "FERRITIN", "TIBC", "IRON", "RETICCTPCT" in the last 72 hours. Sepsis Labs: No results for input(s): "PROCALCITON", "LATICACIDVEN" in the last 168 hours.   Recent Results (from the past 240 hour(s))  Urine Culture     Status: Abnormal   Collection Time: 02/25/22 10:42 PM   Specimen: Urine, Catheterized  Result Value Ref Range Status   Specimen Description   Final    URINE, CATHETERIZED Performed at [facility], [address], [place], [address] [place]    Special Requests   Final    NONE Performed at [facility], [address], [place], [address] [place]    Culture (A)  Final    >=100,000 COLONIES/mL PSEUDOMONAS AERUGINOSA 20,000 COLONIES/mL STAPHYLOCOCCUS AUREUS    Report Status 03/01/2022 FINAL  Final   Organism ID, Bacteria PSEUDOMONAS AERUGINOSA (A)  Final   Organism ID, Bacteria STAPHYLOCOCCUS AUREUS (A)  Final      Susceptibility   Pseudomonas aeruginosa - MIC*    CEFTAZIDIME 16 INTERMEDIATE Intermediate     CIPROFLOXACIN <=0.25 SENSITIVE Sensitive     GENTAMICIN <=1 SENSITIVE  Sensitive     IMIPENEM 2 SENSITIVE Sensitive     * >=100,000 COLONIES/mL PSEUDOMONAS AERUGINOSA   Staphylococcus aureus - MIC*    CIPROFLOXACIN <=0.5 SENSITIVE Sensitive     GENTAMICIN <=0.5 SENSITIVE Sensitive     NITROFURANTOIN <=16 SENSITIVE Sensitive     OXACILLIN 0.5 SENSITIVE Sensitive     TETRACYCLINE <=1 SENSITIVE Sensitive     VANCOMYCIN <=0.5 SENSITIVE Sensitive     TRIMETH/SULFA <=10 SENSITIVE Sensitive     CLINDAMYCIN <=0.25 SENSITIVE Sensitive     RIFAMPIN <=0.5 SENSITIVE Sensitive     Inducible Clindamycin NEGATIVE Sensitive     * 20,000 COLONIES/mL STAPHYLOCOCCUS AUREUS         Radiology Studies: US Abdomen Limited RUQ (LIVER/GB)  Result Date: 03/04/2022 CLINICAL DATA:  36 year old male with history of transaminitis. EXAM: ULTRASOUND ABDOMEN LIMITED RIGHT UPPER  QUADRANT COMPARISON:  None Available. FINDINGS: Gallbladder: Mildly echogenic material visualized throughout the gallbladder which is mildly distended. No gallbladder wall thickening or pericholecystic fluid. No definite sonographic Murphy sign. Common bile duct: Diameter: 4 mm Liver: No focal lesion identified. Within normal limits in parenchymal echogenicity and echotexture. Portal vein is patent on color Doppler imaging with normal direction of blood flow towards the liver. Other: No perihepatic ascites. IMPRESSION: 1. Mild gallbladder distension with biliary sludge. No sonographic evidence of acute cholecystitis. 2. Normal sonographic appearance of the hepatic parenchyma. Ruthann Cancer, MD [facility] Electronically Signed   By: Ruthann Cancer M.D.   On: 03/04/2022 09:35   DG Chest Port 1 View  Result Date: 03/03/2022 CLINICAL DATA:  Confusion. EXAM: PORTABLE CHEST 1 VIEW COMPARISON:  Radiographs 01/18/2022 and 01/14/2022. FINDINGS: 1651 hours. Enteric tube and right IJ central venous catheter have been removed in the interval. There is improved aeration of the lung bases which are clear. The heart size and mediastinal contours are normal. No acute osseous findings are evident. Telemetry leads overlie the chest. IMPRESSION: No evidence of acute cardiopulmonary process. Interval improved aeration of the lung bases. Electronically Signed   By: Richardean Sale M.D.   On: 03/03/2022 17:11        Scheduled Meds:  benztropine  0.5 mg Oral BID   enoxaparin (LOVENOX) injection  40 mg Subcutaneous Q24H   escitalopram  10 mg Oral Daily   gabapentin  100 mg Oral BID   insulin aspart  0-15 Units Subcutaneous TID WC   insulin aspart  0-5 Units Subcutaneous QHS   LORazepam  0.5 mg Oral QHS   ondansetron (ZOFRAN) IV  4 mg Intravenous Q6H   paliperidone  3 mg Oral QHS   tamsulosin  0.4 mg Oral Daily   Continuous Infusions:  dextrose 75 mL/hr at  03/04/22 1256   levofloxacin (LEVAQUIN) IV 750 mg (03/04/22 2132)          Aline August, MD [facility] 03/05/2022, 8:09 AM

## 2022-03-05 NOTE — Evaluation (Signed)
Physical Therapy Evaluation Patient Details Name: Garrett [name] MRN: [id number] DOB: 08-Sep-1985 Today's Date: 03/05/2022  History of Present Illness  Garrett [name] is a 36 y.o. 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  History is obtained with use of interpreter.  Patient is a ward of the state and lives in a group home.  He was sent to the hospital due to nausea and vomiting.  He was diagnosed with a UTI about 5 days ago and was prescribed an antibiotic.  This antibiotic was changed to ciprofloxacin because the urine culture grew out Pseudomonas.  Unfortunately, every time he takes his antibiotic, he is unable to keep anything down and vomits.  His appetite has been poor.  He reports no fevers or chills.  He is come to the hospital for evaluation.   Clinical Impression  Patient demonstrates good return for bed mobility, unsteady on feet having to lean on nearby objects for support when not using an AD, safer using RW and able to walk out of room without loss of balance, but limited secondary to fatigue.  Patient declined to sit up in chair after therapy and able to go back to bed and reposition self without assist - RN aware.  Patient will benefit from continued skilled physical therapy in hospital and recommended venue below to increase strength, balance, endurance for safe ADLs and gait.       Recommendations for follow up therapy are one component of a multi-disciplinary discharge planning process, led by the attending physician.  Recommendations may be updated based on patient status, additional functional criteria and insurance authorization.  Follow Up Recommendations Home health PT      Assistance Recommended at Discharge Intermittent Supervision/Assistance  Patient can return home with the following  A little help with walking and/or transfers;A little help with  bathing/dressing/bathroom;Help with stairs or ramp for entrance;Assistance with cooking/housework    Equipment Recommendations Rolling walker (2 wheels)  Recommendations for Other Services       Functional Status Assessment Patient has had a recent decline in their functional status and demonstrates the ability to make significant improvements in function in a reasonable and predictable amount of time.     Precautions / Restrictions Precautions Precautions: Fall Restrictions Weight Bearing Restrictions: No      Mobility  Bed Mobility Overal bed mobility: Modified Independent                  Transfers Overall transfer level: Needs assistance Equipment used: Rolling walker (2 wheels), None Transfers: Sit to/from Stand, Bed to chair/wheelchair/BSC Sit to Stand: Min guard   Step pivot transfers: Min guard       General transfer comment: unsteady on feet having to lean on armrest of chair, safer using RW    Ambulation/Gait Ambulation/Gait assistance: Min guard Gait Distance (Feet): 30 Feet Assistive device: Rolling walker (2 wheels) Gait Pattern/deviations: Decreased step length - right, Decreased step length - left, Decreased stride length Gait velocity: decreased     General Gait Details: slow slightly labored cadence without loss of balance using RW, limited mostly due to c/o fatigue  Stairs            Wheelchair Mobility    Modified Rankin (Stroke Patients Only)       Balance Overall balance assessment: Needs assistance Sitting-balance support: Feet supported, No upper extremity supported Sitting balance-Leahy Scale: Good Sitting balance - Comments: seated at EOB  Standing balance support: During functional activity, No upper extremity supported Standing balance-Leahy Scale: Poor Standing balance comment: fair/poor without AD, fair/good using RW                             Pertinent Vitals/Pain Pain Assessment Pain  Assessment: No/denies pain    Home Living Family/patient expects to be discharged to:: Group home                        Prior Function Prior Level of Function : Needs assist       Physical Assist : Mobility (physical);ADLs (physical) Mobility (physical): Bed mobility;Transfers;Gait;Stairs   Mobility Comments: household ambulator without AD ADLs Comments: Assisted by group home staff     Hand Dominance        Extremity/Trunk Assessment   Upper Extremity Assessment Upper Extremity Assessment: Overall WFL for tasks assessed    Lower Extremity Assessment Lower Extremity Assessment: Generalized weakness    Cervical / Trunk Assessment Cervical / Trunk Assessment: Normal  Communication   Communication: Prefers language other than [place];Interpreter utilized  Cognition Arousal/Alertness: Awake/alert Behavior During Therapy: WFL for tasks assessed/performed Overall Cognitive Status: Within Functional Limits for tasks assessed                                          General Comments      Exercises     Assessment/Plan    PT Assessment Patient needs continued PT services  PT Problem List Decreased strength;Decreased activity tolerance;Decreased balance;Decreased mobility       PT Treatment Interventions DME instruction;Gait training;Stair training;Functional mobility training;Therapeutic activities;Therapeutic exercise;Balance training;Patient/family education    PT Goals (Current goals can be found in the Care Plan section)  Acute Rehab PT Goals Patient Stated Goal: return home PT Goal Formulation: With patient Time For Goal Achievement: 03/10/22 Potential to Achieve Goals: Good    Frequency Min 3X/week     Co-evaluation               AM-PAC PT "6 Clicks" Mobility  Outcome Measure Help needed turning from your back to your side while in a flat bed without using bedrails?: None Help needed moving from lying on your back to  sitting on the side of a flat bed without using bedrails?: None Help needed moving to and from a bed to a chair (including a wheelchair)?: A Little Help needed standing up from a chair using your arms (e.g., wheelchair or bedside chair)?: A Little Help needed to walk in hospital room?: A Little Help needed climbing 3-5 steps with a railing? : A Lot 6 Click Score: 19    End of Session   Activity Tolerance: Patient tolerated treatment well;Patient limited by fatigue Patient left: in bed;with call bell/phone within reach;with nursing/sitter in room Nurse Communication: Mobility status PT Visit Diagnosis: Unsteadiness on feet (R26.81);Other abnormalities of gait and mobility (R26.89);Muscle weakness (generalized) (M62.81)    Time: [id number] PT Time Calculation (min) (ACUTE ONLY): 30 min   Charges:   PT Evaluation $PT Eval Moderate Complexity: 1 Mod PT Treatments $Therapeutic Activity: 23-37 mins        1:03 PM, 03/05/22 Lonell Grandchild, MPT Physical Therapist with [facility] [phone] office [phone] mobile phone

## 2022-03-05 NOTE — Plan of Care (Signed)
  Problem: Acute Rehab PT Goals(only PT should resolve) Goal: Pt Will Go Supine/Side To Sit Outcome: Progressing Flowsheets (Taken 03/05/2022 1306) Pt will go Supine/Side to Sit:  Independently  with modified independence Goal: Patient Will Transfer Sit To/From Stand Outcome: Progressing Flowsheets (Taken 03/05/2022 1306) Patient will transfer sit to/from stand:  with supervision  with modified independence Goal: Pt Will Transfer Bed To Chair/Chair To Bed Outcome: Progressing Flowsheets (Taken 03/05/2022 1306) Pt will Transfer Bed to Chair/Chair to Bed:  with supervision  with modified independence Goal: Pt Will Ambulate Outcome: Progressing Flowsheets (Taken 03/05/2022 1306) Pt will Ambulate:  50 feet  with supervision  with rolling walker   1:06 PM, 03/05/22 Ocie Bob, MPT Physical Therapist with [facility] [phone] office [phone] mobile phone

## 2022-03-05 NOTE — Progress Notes (Signed)
Pt has slept most of the night. Tried communicating with patient earlier in shift with communication device located in room. Communicator translated from nurse to patient, patient would not really respond or answer. Initially pt had a mouth full of saliva and was holding it, nurse gave patient a basin and he spit all the saliva in it. Upon entering room around 5 am to give medication, there was a moderate amount of clear spit/vomit in the floor beside the bed with small amount of green in it. Patient is not swallowing, he is holding it in his mouth and then spitting it out.

## 2022-03-05 NOTE — Progress Notes (Addendum)
Pharmacy Antibiotic Note  Garrett [name] is a 36 y.o. male admitted on 03/03/2022 with recent dx of Pseudomonas UTI.  Pharmacy has been consulted for Merrem dosing. Patient with ongoing nausea and thought levaquin might be contributing. Per micro lab, cultures show additional resistance to cefepime and pip/tazo. Discussed with MD and will switch to Merrem. Last dose of levaquin 12/2 at 10pm  Plan: Merrem 1gm q 8h starting tonight  Height: 5\' 8"  (172.7 cm) Weight: 49 kg (108 lb 0.4 oz) IBW/kg (Calculated) : 68.4  Temp (24hrs), Avg:98.2 F (36.8 C), Min:98 F (36.7 C), Max:98.4 F (36.9 C)  Recent Labs  Lab 02/27/22 1837 03/03/22 1720 03/04/22 0413 03/05/22 0600  WBC 7.8 6.9 6.1 5.1  CREATININE 0.83 0.79 0.60* 0.62    Estimated Creatinine Clearance: 86.8 mL/min (by C-G formula based on SCr of 0.62 mg/dL).    No Known Allergies  Antimicrobials this admission: 12/1 Levaquin > 12/3 On Cipro pta  Microbiology results: 11/25 UCx: Peudomonas and Staph   Thank you for allowing pharmacy to be a part of this patient's care.  12/25 [facility] 03/05/2022 9:25 AM

## 2022-03-06 DIAGNOSIS — N3000 Acute cystitis without hematuria: Secondary | ICD-10-CM | POA: Diagnosis not present

## 2022-03-06 DIAGNOSIS — B1910 Unspecified viral hepatitis B without hepatic coma: Secondary | ICD-10-CM

## 2022-03-06 DIAGNOSIS — R7989 Other specified abnormal findings of blood chemistry: Secondary | ICD-10-CM | POA: Diagnosis not present

## 2022-03-06 DIAGNOSIS — F333 Major depressive disorder, recurrent, severe with psychotic symptoms: Secondary | ICD-10-CM | POA: Diagnosis not present

## 2022-03-06 LAB — CBC
HCT: 33.1 % — ABNORMAL LOW (ref 39.0–52.0)
Hemoglobin: 10.8 g/dL — ABNORMAL LOW (ref 13.0–17.0)
MCH: 31.2 pg (ref 26.0–34.0)
MCHC: 32.6 g/dL (ref 30.0–36.0)
MCV: 95.7 fL (ref 80.0–100.0)
Platelets: 122 10*3/uL — ABNORMAL LOW (ref 150–400)
RBC: 3.46 MIL/uL — ABNORMAL LOW (ref 4.22–5.81)
RDW: 13.4 % (ref 11.5–15.5)
WBC: 6.1 10*3/uL (ref 4.0–10.5)
nRBC: 0 % (ref 0.0–0.2)

## 2022-03-06 LAB — TSH: TSH: 3.794 u[IU]/mL (ref 0.350–4.500)

## 2022-03-06 LAB — COMPREHENSIVE METABOLIC PANEL
ALT: 105 U/L — ABNORMAL HIGH (ref 0–44)
AST: 111 U/L — ABNORMAL HIGH (ref 15–41)
Albumin: 2.7 g/dL — ABNORMAL LOW (ref 3.5–5.0)
Alkaline Phosphatase: 42 U/L (ref 38–126)
Anion gap: 3 — ABNORMAL LOW (ref 5–15)
BUN: <5 mg/dL — ABNORMAL LOW (ref 6–20)
CO2: 28 mmol/L (ref 22–32)
Calcium: 7.9 mg/dL — ABNORMAL LOW (ref 8.9–10.3)
Chloride: 102 mmol/L (ref 98–111)
Creatinine, Ser: 0.57 mg/dL — ABNORMAL LOW (ref 0.61–1.24)
GFR, Estimated: >60 mL/min (ref >60)
Glucose, Bld: 92 mg/dL (ref 70–99)
Potassium: 3.4 mmol/L — ABNORMAL LOW (ref 3.5–5.1)
Sodium: 133 mmol/L — ABNORMAL LOW (ref 135–145)
Total Bilirubin: 1.7 mg/dL — ABNORMAL HIGH (ref 0.3–1.2)
Total Protein: 6.9 g/dL (ref 6.5–8.1)

## 2022-03-06 LAB — CBC WITH DIFFERENTIAL/PLATELET
Abs Immature Granulocytes: 0.02 10*3/uL (ref 0.00–0.07)
Basophils Absolute: 0 10*3/uL (ref 0.0–0.1)
Basophils Relative: 0 %
Eosinophils Absolute: 0.2 10*3/uL (ref 0.0–0.5)
Eosinophils Relative: 4 %
HCT: 33.7 % — ABNORMAL LOW (ref 39.0–52.0)
Hemoglobin: 11.5 g/dL — ABNORMAL LOW (ref 13.0–17.0)
Immature Granulocytes: 0 %
Lymphocytes Relative: 46 %
Lymphs Abs: 2.1 10*3/uL (ref 0.7–4.0)
MCH: 30.7 pg (ref 26.0–34.0)
MCHC: 34.1 g/dL (ref 30.0–36.0)
MCV: 89.9 fL (ref 80.0–100.0)
Monocytes Absolute: 0.5 10*3/uL (ref 0.1–1.0)
Monocytes Relative: 11 %
Neutro Abs: 1.8 10*3/uL (ref 1.7–7.7)
Neutrophils Relative %: 39 %
Platelets: 133 10*3/uL — ABNORMAL LOW (ref 150–400)
RBC: 3.75 MIL/uL — ABNORMAL LOW (ref 4.22–5.81)
RDW: 13.1 % (ref 11.5–15.5)
WBC: 4.7 10*3/uL (ref 4.0–10.5)
nRBC: 0 % (ref 0.0–0.2)

## 2022-03-06 LAB — AMMONIA: Ammonia: 31 umol/L (ref 9–35)

## 2022-03-06 LAB — MAGNESIUM: Magnesium: 1.8 mg/dL (ref 1.7–2.4)

## 2022-03-06 LAB — HEMOGLOBIN A1C
Hgb A1c MFr Bld: 4.8 % (ref 4.8–5.6)
Mean Plasma Glucose: 91 mg/dL

## 2022-03-06 LAB — GLUCOSE, CAPILLARY
Glucose-Capillary: 111 mg/dL — ABNORMAL HIGH (ref 70–99)
Glucose-Capillary: 129 mg/dL — ABNORMAL HIGH (ref 70–99)
Glucose-Capillary: 104 mg/dL — ABNORMAL HIGH (ref 70–99)
Glucose-Capillary: 102 mg/dL — ABNORMAL HIGH (ref 70–99)

## 2022-03-06 LAB — VITAMIN B12: Vitamin B-12: 2330 pg/mL — ABNORMAL HIGH (ref 180–914)

## 2022-03-06 MED ORDER — FLUCONAZOLE 100MG IVPB
100.0000 mg | INTRAVENOUS | Status: DC
  Administered 2022-03-06 – 2022-03-08 (×3): 100 mg via INTRAVENOUS
  Filled 2022-03-06 (×4): qty 50

## 2022-03-06 NOTE — Progress Notes (Signed)
Pt continues to refuse anything oral. He is still holding his saliva in his mouth and spits it in the floor beside the bed. Pt given a basin but does not use it.

## 2022-03-06 NOTE — Progress Notes (Signed)
PROGRESS NOTE    Garrett [name]  ZOX:[id number] DOB: [birth year] DOA: 03/03/2022 PCP: Pcp, No   Brief Narrative:   36 y.o. 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  On presentation, chest x-ray was negative for acute abnormality.  He was started on IV Levaquin.  Assessment & Plan:   UTI -Recent diagnosis of Pseudomonas UTI as an outpatient for which she was placed on oral ciprofloxacin which led to nausea. -Follow-up urine culture.  Continue meropenem  Nausea -Possibly medication related.  Currently on round-the-clock Zofran and as needed Reglan.  Still having intermittent nausea and vomiting.  Plan as above.  Urinary retention -Patient had recent urinary retention requiring Foley catheter placement which was removed as an outpatient by urology.  He had Foley catheter placement again which was removed in the ED on presentation.  Continue Flomax.   -Continue to have issues with urinary retention requiring in and out catheterization few times and subsequently Foley catheter was placed on 03/04/2022.  Outpatient follow-up with urology.  Elevated LFTs History of chronic hep B -LFTs improving.  Follow right upper quadrant ultrasound.  Outpatient follow-up with ID  Hypokalemia -Continue replacement with IV fluids.  Repeat a.m. labs  Hyponatremia -Continue IV fluids.  Repeat a.m. labs.  Thrombocytopenia -Questionable cause.  Repeat a.m. labs  Diabetes mellitus type 2 with hypoglycemia -Blood sugars on the lower side.  DC'd long-acting insulin.  Currently on D5 drip. -Currently n.p.o.  Dysphagia -Questionable cause.  Currently NPO.  SLP eval.  History of  psychosis and depression -Continue benztropine, escitalopram, gabapentin, paliperidone, lorazepam if able to swallow.  Outpatient follow-up with psychiatry   DVT prophylaxis: Lovenox Code Status: Full Family Communication: None at bedside Disposition Plan: Status is: Inpatient Remains inpatient appropriate because: Of severity of illness   Consultants: None  Procedures: None  Antimicrobials:  Anti-infectives (From admission, onward)    Start     Dose/Rate Route Frequency Ordered Stop   03/05/22 2200  meropenem (MERREM) 1 g in sodium chloride 0.9 % 100 mL IVPB        1 g 200 mL/hr over 30 Minutes Intravenous Every 8 hours 03/05/22 0925     03/03/22 1700  levofloxacin (LEVAQUIN) IVPB 750 mg  Status:  Discontinued        750 mg 100 mL/hr over 90 Minutes Intravenous Every 24 hours 03/03/22 1652 03/05/22 0815         Subjective: Patient seen and examined at bedside.  Extremely poor historian.  Wakes up very slightly, does not answers any questions.  Nursing staff reports that patient continues to refuse anything oral and continues to hold his saliva in his mouth and spits it in the floor. No fever, seizures reported.   Objective: Vitals:   03/04/22 2112 03/05/22 0542 03/05/22 1523 03/06/22 0525  BP: (!) 137/93 (!) 84/58 (!) 122/90 (!) 124/92  Pulse: 79 79 75 72  Resp: 19 18 16 16   Temp: 98.4 F (36.9 C) 98 F (36.7 C) 98.7 F (37.1 C) 98.2 F (36.8 C)  TempSrc: Oral  Oral Oral  SpO2: 100% 100% 100% 100%  Weight:      Height:        Intake/Output Summary (Last 24 hours) at 03/06/2022 0732 Last data filed  at 03/06/2022 0546 Gross per 24 hour  Intake 3112.31 ml  Output 2975 ml  Net 137.31 ml    Filed Weights   03/03/22 1540  Weight: 49 kg    Examination:  General: No acute distress.  Still on room air.  Looks chronically ill and deconditioned. ENT/neck: No palpable neck masses or JVD elevation noted  respiratory: Bilateral decreased breath sounds at bases  with scattered crackles; no wheezing CVS: Rate mostly controlled; S1 and S2 heard  abdominal: Soft, nontender, distended mildly; no organomegaly, normal bowel sounds heard  extremities: No clubbing; mild lower extremity edema present  CNS: Waking up only very slightly; hardly participates in any kind of conversation.  Very poor historian.   No focal neurologic deficit.  Able to move extremities  lymph: No palpable lymphadenopathy noted  skin: No obvious petechiae/rashes psych: Currently not agitated.  Affect is very flat.   Musculoskeletal: No obvious joint erythema/tenderness   Data Reviewed: I have personally reviewed following labs and imaging studies  CBC: Recent Labs  Lab 02/27/22 1837 03/03/22 1720 03/04/22 0413 03/05/22 0600 03/06/22 0532  WBC 7.8 6.9 6.1 5.1 4.7  NEUTROABS  --   --   --  3.1 1.8  HGB 13.4 13.6 10.8* 11.2* 11.5*  HCT 41.6 41.8 33.1* 33.7* 33.7*  MCV 96.1 95.4 95.7 91.3 89.9  PLT 197 161 122* 127* 133*    Basic Metabolic Panel: Recent Labs  Lab 02/27/22 1837 03/03/22 1720 03/04/22 0413 03/05/22 0600 03/06/22 0532  NA 145 144 142 132* 133*  K 3.6 3.5 3.1* 2.6* 3.4*  CL 108 104 109 98 102  CO2 26 31 26 29 28   GLUCOSE 97 99 73 131* 92  BUN 18 17 12 6  <5*  CREATININE 0.83 0.79 0.60* 0.62 0.57*  CALCIUM 8.9 8.6* 7.5* 7.5* 7.9*  MG  --   --   --  1.8 1.8    GFR: Estimated Creatinine Clearance: 86.8 mL/min (A) (by C-G formula based on SCr of 0.57 mg/dL (L)). Liver Function Tests: Recent Labs  Lab 02/27/22 1837 03/03/22 1720 03/04/22 0413 03/05/22 0600 03/06/22 0532  AST 99* 151* 101* 100* 111*  ALT 109* 155* 109* 101* 105*  ALKPHOS 59 54 40 39 42  BILITOT 2.1* 1.6* 1.4* 2.0* 1.7*  PROT 9.0* 8.9* 6.8 6.7 6.9  ALBUMIN 3.4* 3.3* 2.6* 2.5* 2.7*    Recent Labs  Lab 02/27/22 1837  LIPASE 51    Recent Labs  Lab 03/06/22 0532  AMMONIA 31   Coagulation Profile: No results for input(s): "INR", "PROTIME" in the last 168  hours. Cardiac Enzymes: No results for input(s): "CKTOTAL", "CKMB", "CKMBINDEX", "TROPONINI" in the last 168 hours. BNP (last 3 results) No results for input(s): "PROBNP" in the last 8760 hours. HbA1C: No results for input(s): "HGBA1C" in the last 72 hours. CBG: Recent Labs  Lab 03/04/22 2114 03/05/22 0814 03/05/22 1225 03/05/22 1618 03/05/22 2123  GLUCAP 118* 142* 140* 127* 111*    Lipid Profile: No results for input(s): "CHOL", "HDL", "LDLCALC", "TRIG", "CHOLHDL", "LDLDIRECT" in the last 72 hours. Thyroid Function Tests: Recent Labs    03/06/22 0532  TSH 3.794   Anemia Panel: No results for input(s): "VITAMINB12", "FOLATE", "FERRITIN", "TIBC", "IRON", "RETICCTPCT" in the last 72 hours. Sepsis Labs: No results for input(s): "PROCALCITON", "LATICACIDVEN" in the last 168 hours.   Recent Results (from the past 240 hour(s))  Urine Culture     Status: Abnormal   Collection Time: 02/25/22 10:42 PM   Specimen:  Urine, Catheterized  Result Value Ref Range Status   Specimen Description   Final    URINE, CATHETERIZED Performed at [facility], [address], [place], [place] [address]    Special Requests   Final    NONE Performed at [facility], [address], [place], [place] [address]    Culture (A)  Final    >=100,000 COLONIES/mL PSEUDOMONAS AERUGINOSA 20,000 COLONIES/mL STAPHYLOCOCCUS AUREUS    Report Status 03/01/2022 FINAL  Final   Organism ID, Bacteria PSEUDOMONAS AERUGINOSA (A)  Final   Organism ID, Bacteria STAPHYLOCOCCUS AUREUS (A)  Final      Susceptibility   Pseudomonas aeruginosa - MIC*    CEFTAZIDIME 16 INTERMEDIATE Intermediate     CIPROFLOXACIN <=0.25 SENSITIVE Sensitive     GENTAMICIN <=1 SENSITIVE Sensitive     IMIPENEM 2 SENSITIVE Sensitive     * >=100,000 COLONIES/mL PSEUDOMONAS AERUGINOSA   Staphylococcus aureus - MIC*    CIPROFLOXACIN <=0.5 SENSITIVE Sensitive     GENTAMICIN <=0.5 SENSITIVE Sensitive     NITROFURANTOIN <=16 SENSITIVE  Sensitive     OXACILLIN 0.5 SENSITIVE Sensitive     TETRACYCLINE <=1 SENSITIVE Sensitive     VANCOMYCIN <=0.5 SENSITIVE Sensitive     TRIMETH/SULFA <=10 SENSITIVE Sensitive     CLINDAMYCIN <=0.25 SENSITIVE Sensitive     RIFAMPIN <=0.5 SENSITIVE Sensitive     Inducible Clindamycin NEGATIVE Sensitive     * 20,000 COLONIES/mL STAPHYLOCOCCUS AUREUS  Urine Culture     Status: None   Collection Time: 03/04/22  1:24 PM   Specimen: Urine, Clean Catch  Result Value Ref Range Status   Specimen Description   Final    URINE, CLEAN CATCH Performed at [facility], [address] [place], [place] [address]    Special Requests   Final    NONE Performed at [facility], [address], [place], [place] [address]    Culture   Final    NO GROWTH Performed at [facility], [address], [place], [place] [address]    Report Status 03/05/2022 FINAL  Final         Radiology Studies: US Abdomen Limited RUQ (LIVER/GB)  Result Date: 03/04/2022 CLINICAL DATA:  36 year old male with history of transaminitis. EXAM: ULTRASOUND ABDOMEN LIMITED RIGHT UPPER QUADRANT COMPARISON:  None Available. FINDINGS: Gallbladder: Mildly echogenic material visualized throughout the gallbladder which is mildly distended. No gallbladder wall thickening or pericholecystic fluid. No definite sonographic Murphy sign. Common bile duct: Diameter: 4 mm Liver: No focal lesion identified. Within normal limits in parenchymal echogenicity and echotexture. Portal vein is patent on color Doppler imaging with normal direction of blood flow towards the liver. Other: No perihepatic ascites. IMPRESSION: 1. Mild gallbladder distension with biliary sludge. No sonographic evidence of acute cholecystitis. 2. Normal sonographic appearance of the hepatic parenchyma. Marliss Coots, MD [facility] Electronically Signed   By: Marliss Coots M.D.   On: 03/04/2022 09:35        Scheduled  Meds:  benztropine  0.5 mg Oral BID   enoxaparin (LOVENOX) injection  40 mg Subcutaneous Q24H   escitalopram  10 mg Oral Daily   gabapentin  100 mg Oral BID   LORazepam  0.5 mg Oral QHS   ondansetron (ZOFRAN) IV  4 mg Intravenous Q6H   paliperidone  3 mg Oral QHS   tamsulosin  0.4 mg Oral Daily   Continuous Infusions:  dextrose 5 % and 0.9 % NaCl with KCl 40  mEq/L 75 mL/hr at 03/06/22 0546   dextrose 75 mL/hr at 03/04/22 1256   meropenem (MERREM) IV 1 g (03/05/22 2127)          Glade Lloyd, MD [facility] 03/06/2022, 7:32 AM

## 2022-03-06 NOTE — TOC Initial Note (Signed)
Transition of Care [facility]) - Initial/Assessment Note    Patient Details  Name: Garrett [name] MRN: [id number] Date of Birth: [birth year]  Transition of [facility] CM/SW Contact:    Annice Needy, LCSW Phone Number: 03/06/2022, 4:22 PM  Clinical Narrative:                 Patient from [facility]. Staff has recently been having to assist with all ADLs. He uses a standard walker however he has had standby assistance recently. He can return to facility. Discussed HHPT and no accepting facilities. Roddie Mc, facility owner is agreeable to OPPT. RW ordered via Kristen with [employer].   Expected Discharge Plan: Group Home Barriers to Discharge: Continued Medical Work up   Patient Goals and CMS Choice        Expected Discharge Plan and Services Expected Discharge Plan: Group Home                                              Prior Living Arrangements/Services   Lives with:: Facility Resident Patient language and need for interpreter reviewed:: Yes Do you feel safe going back to the place where you live?: Yes          Current home services: DME    Activities of Daily Living Home Assistive Devices/Equipment: Dan Humphreys (specify type) ADL Screening (condition at time of admission) Patient's cognitive ability adequate to safely complete daily activities?: No Is the patient deaf or have difficulty hearing?: No Does the patient have difficulty seeing, even when wearing glasses/contacts?: No Does the patient have difficulty concentrating, remembering, or making decisions?: No Patient able to express need for assistance with ADLs?: Yes Does the patient have difficulty dressing or bathing?: Yes Independently performs ADLs?: No Communication: Independent Dressing (OT): Needs assistance Is this a change from baseline?: Pre-admission baseline Grooming: Needs assistance Is this a change from baseline?: Pre-admission baseline Feeding: Independent Bathing: Needs  assistance Is this a change from baseline?: Pre-admission baseline Toileting: Needs assistance Is this a change from baseline?: Pre-admission baseline In/Out Bed: Needs assistance Is this a change from baseline?: Pre-admission baseline Walks in Home: Independent with device (comment) Does the patient have difficulty walking or climbing stairs?: Yes Weakness of Legs: Both Weakness of Arms/Hands: Both  Permission Sought/Granted                  Emotional Assessment       Orientation: : Oriented to Self Alcohol / Substance Use: Not Applicable Psych Involvement: No (comment)  Admission diagnosis:  UTI (urinary tract infection) [N39.0] Transaminitis [R74.01] Acute cystitis with hematuria [N30.01] Intractable nausea and vomiting [R11.2] Patient Active Problem List   Diagnosis Date Noted   Hypotension 01/26/2022   Hypoalbuminemia 01/24/2022   Hyponatremia 01/24/2022   Postoperative intra-abdominal abscess 01/20/2022   Severe malnutrition (HCC) 01/18/2022   Chronic hepatitis B without hepatic coma (HCC) 01/14/2022   Transaminitis 09/26/2021   Constipation 06/29/2021   Peripheral neuropathy 06/28/2021   Generalized weakness    Low back pain 05/24/2021   Major depressive disorder, recurrent episode, moderate with mood-congruent psychotic features (HCC)    Chronic Hepatitis B 05/12/2021   Physical deconditioning    Anemia 05/05/2021   Altered mental status    Controlled type 2 diabetes mellitus without complication, with long-term current use of insulin (HCC) 03/09/2020   Acute metabolic encephalopathy 03/09/2020  Elevated LFTs 03/09/2020   Major depressive disorder, recurrent severe without psychotic features (HCC) 06/20/2017   MDD (major depressive disorder), recurrent, severe, with psychosis (HCC) 06/19/2017   PCP:  Pcp, No Pharmacy:   [facility], [place] - [address] [place] [address] Phone: [phone] Fax:  [phone]     Social Determinants of Health (SDOH) Interventions    Readmission Risk Interventions     No data to display

## 2022-03-06 NOTE — Evaluation (Signed)
Clinical/Bedside Swallow Evaluation Patient Details  Name: Garrett [name] MRN: [id number] Date of Birth: 1985/11/20  Today's Date: 03/06/2022 Time: SLP Start Time (ACUTE ONLY): 1025 SLP Stop Time (ACUTE ONLY): 1107 SLP Time Calculation (min) (ACUTE ONLY): 42 min  Past Medical History:  Past Medical History:  Diagnosis Date   Acute metabolic encephalopathy 03/09/2020   Altered mental status    Anemia 05/05/2021   Chronic Hepatitis B 05/12/2021   Chronic hepatitis B without hepatic coma (HCC) 01/14/2022   Depression    Diabetes mellitus (HCC)    Elevated LFTs 03/09/2020   Hypoalbuminemia 01/24/2022   Hyponatremia 01/24/2022   Hypotension 01/26/2022   Major depressive disorder, recurrent episode, moderate with mood-congruent psychotic features (HCC)    Major depressive disorder, recurrent severe without psychotic features (HCC) 06/20/2017   Physical deconditioning    Postoperative intra-abdominal abscess 01/20/2022   Severe malnutrition (HCC) 01/18/2022   Severe malnutrition (HCC) 01/18/2022   Past Surgical History:  Past Surgical History:  Procedure Laterality Date   APPENDECTOMY     EXPLORATORY LAPAROTOMY     HPI:  36 y.o. 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  On presentation, chest x-ray was negative for acute abnormality.  He was started on IV Levaquin. Nursing staff reports that patient continues to refuse anything oral and continues to hold his saliva in his mouth and spits it in the floor. No fever, seizures reported. BSE ordered.    Assessment / Plan / Recommendation  Clinical Impression  Clinical swallow evaluation completed at bedside  with use of interpreter [phone] for Garrett [name]). Pt denies difficulty swallowing, but states that he feels naseaous every time he tries to eat, drink, or swallow his saliva. He reports that this has been going on for a "long time" and that it has happened to him before. He indicates that he has no friends or family in the area. Oral motor examination reveals sparse dentition, otherwise WNL. He expectorates his saliva after pooling in oral cavity. Pt grimmaces with swallows, but denies pain- only nausea. Pt assessed with cup/straw thin liquids, italian ice, puree, and peanut butter crackers. Intake was limited and Pt did seem to swallow, but also exhibited oral holding and expectoration of about half the bolus. No emesis, regurgitation, vomiting witnessed. Oropharyngeal impairment does not seem likely, however overall intake was limited. Pt does have white coated lingual surfaces, but denies odynophagia so unsure if candidiasis could be playing a role. Chart review reveals nausea started after initiation of antibiotics. Consider treatment for thrush if MD feels indicated; also consider GI consult to see if any benefit of EGD. Pt with multiple psychiatric admissions, so this could also be playing a role. Above to RN. Continue allowing regular textures and thin liquids and SLP will check back on Pt while in acute stay.  SLP Visit Diagnosis: Dysphagia, unspecified (R13.10)    Aspiration Risk  Risk for inadequate nutrition/hydration    Diet Recommendation Regular;Thin liquid   Liquid Administration via: Cup;Straw Medication Administration: Whole meds with liquid Supervision: Patient able to self feed Compensations: Slow rate Postural Changes: Seated upright at 90 degrees;Remain upright for at least 30 minutes after po intake    Other  Recommendations Recommended Consults: Consider GI evaluation Oral Care Recommendations: Oral  care BID Other Recommendations: Clarify dietary restrictions     Recommendations for follow up therapy are one component of a multi-disciplinary discharge planning process, led by the attending physician.  Recommendations may be updated based on patient status, additional functional criteria and insurance authorization.  Follow up Recommendations Follow physician's recommendations for discharge plan and follow up therapies      Assistance Recommended at Discharge    Functional Status Assessment Patient has had a recent decline in their functional status and demonstrates the ability to make significant improvements in function in a reasonable and predictable amount of time.  Frequency and Duration min 2x/week  1 week       Prognosis Prognosis for Safe Diet Advancement: Fair Barriers to Reach Goals: Behavior      Swallow Study   General Date of Onset: 03/03/22 HPI: 36 y.o. 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  On presentation, chest x-ray was negative for acute abnormality.  He was started on IV Levaquin. Nursing staff reports that patient continues to refuse anything oral and continues to hold his saliva in his mouth and spits it in the floor. No fever, seizures reported. BSE ordered. Type of Study: Bedside Swallow Evaluation Previous Swallow Assessment: 03/2020 BSE with recommendation for reg/thin Diet Prior to this Study: Regular;Thin liquids Temperature Spikes Noted: No Respiratory Status: Room air History of Recent Intubation: No Behavior/Cognition: Alert;Cooperative;Pleasant mood Oral Cavity Assessment: Excessive secretions (Pt orally holding secretions) Oral Care Completed by SLP: Yes Oral Cavity -  Dentition: Missing dentition (Pt appears to only have a few teeth, coated white tongue) Vision: Functional for self-feeding Self-Feeding Abilities: Able to feed self Patient Positioning: Upright in bed Baseline Vocal Quality: Normal Volitional Cough: Weak Volitional Swallow: Able to elicit    Oral/Motor/Sensory Function Overall Oral Motor/Sensory Function: Within functional limits   Ice Chips Ice chips: Impaired Presentation: Spoon Oral Phase Impairments: Other (comment) (oral holding) Oral Phase Functional Implications: Oral holding Pharyngeal Phase Impairments:  (expectoration)   Thin Liquid Thin Liquid: Impaired Presentation: Straw;Self Fed;Cup Oral Phase Functional Implications: Oral holding Pharyngeal  Phase Impairments:  (expectoration)    Nectar Thick Nectar Thick Liquid: Not tested   Honey Thick Honey Thick Liquid: Not tested   Puree Puree: Impaired Presentation: Spoon Oral Phase Impairments: Reduced lingual movement/coordination (guarding behaviors) Oral Phase Functional Implications: Oral holding;Prolonged oral transit   Solid     Solid: Impaired (only took a small bite) Presentation: Self Fed Oral Phase Impairments: Reduced lingual movement/coordination Oral Phase Functional Implications: Oral holding;Prolonged oral transit Pharyngeal Phase Impairments:  (expectoration)     Thank you,  Havery Moros, CCC-SLP [phone]  Maurina Fawaz 03/06/2022,12:58 PM

## 2022-03-07 DIAGNOSIS — F333 Major depressive disorder, recurrent, severe with psychotic symptoms: Secondary | ICD-10-CM | POA: Diagnosis not present

## 2022-03-07 DIAGNOSIS — N3000 Acute cystitis without hematuria: Secondary | ICD-10-CM | POA: Diagnosis not present

## 2022-03-07 DIAGNOSIS — R7989 Other specified abnormal findings of blood chemistry: Secondary | ICD-10-CM | POA: Diagnosis not present

## 2022-03-07 LAB — CBC WITH DIFFERENTIAL/PLATELET
Abs Immature Granulocytes: 0.01 10*3/uL (ref 0.00–0.07)
Basophils Absolute: 0 10*3/uL (ref 0.0–0.1)
Basophils Relative: 1 %
Eosinophils Absolute: 0.2 10*3/uL (ref 0.0–0.5)
Eosinophils Relative: 4 %
HCT: 34.2 % — ABNORMAL LOW (ref 39.0–52.0)
Hemoglobin: 11.5 g/dL — ABNORMAL LOW (ref 13.0–17.0)
Immature Granulocytes: 0 %
Lymphocytes Relative: 47 %
Lymphs Abs: 2.4 10*3/uL (ref 0.7–4.0)
MCH: 31.2 pg (ref 26.0–34.0)
MCHC: 33.6 g/dL (ref 30.0–36.0)
MCV: 92.7 fL (ref 80.0–100.0)
Monocytes Absolute: 0.7 10*3/uL (ref 0.1–1.0)
Monocytes Relative: 13 %
Neutro Abs: 1.8 10*3/uL (ref 1.7–7.7)
Neutrophils Relative %: 35 %
Platelets: 150 10*3/uL (ref 150–400)
RBC: 3.69 MIL/uL — ABNORMAL LOW (ref 4.22–5.81)
RDW: 13.5 % (ref 11.5–15.5)
WBC: 5.1 10*3/uL (ref 4.0–10.5)
nRBC: 0 % (ref 0.0–0.2)

## 2022-03-07 LAB — GLUCOSE, CAPILLARY
Glucose-Capillary: 125 mg/dL — ABNORMAL HIGH (ref 70–99)
Glucose-Capillary: 127 mg/dL — ABNORMAL HIGH (ref 70–99)
Glucose-Capillary: 89 mg/dL (ref 70–99)
Glucose-Capillary: 109 mg/dL — ABNORMAL HIGH (ref 70–99)

## 2022-03-07 LAB — COMPREHENSIVE METABOLIC PANEL
ALT: 136 U/L — ABNORMAL HIGH (ref 0–44)
AST: 153 U/L — ABNORMAL HIGH (ref 15–41)
Albumin: 2.7 g/dL — ABNORMAL LOW (ref 3.5–5.0)
Alkaline Phosphatase: 46 U/L (ref 38–126)
Anion gap: 6 (ref 5–15)
BUN: <5 mg/dL — ABNORMAL LOW (ref 6–20)
CO2: 25 mmol/L (ref 22–32)
Calcium: 7.8 mg/dL — ABNORMAL LOW (ref 8.9–10.3)
Chloride: 105 mmol/L (ref 98–111)
Creatinine, Ser: 0.68 mg/dL (ref 0.61–1.24)
GFR, Estimated: >60 mL/min (ref >60)
Glucose, Bld: 132 mg/dL — ABNORMAL HIGH (ref 70–99)
Potassium: 3.9 mmol/L (ref 3.5–5.1)
Sodium: 136 mmol/L (ref 135–145)
Total Bilirubin: 1.8 mg/dL — ABNORMAL HIGH (ref 0.3–1.2)
Total Protein: 7 g/dL (ref 6.5–8.1)

## 2022-03-07 LAB — MAGNESIUM: Magnesium: 2.1 mg/dL (ref 1.7–2.4)

## 2022-03-07 MED ORDER — ONDANSETRON HCL 4 MG/2ML IJ SOLN
4.0000 mg | Freq: Four times a day (QID) | INTRAMUSCULAR | Status: DC | PRN
  Administered 2022-03-07: 4 mg via INTRAVENOUS
  Filled 2022-03-07: qty 2

## 2022-03-07 MED ORDER — CHLORHEXIDINE GLUCONATE CLOTH 2 % EX PADS
6.0000 | MEDICATED_PAD | Freq: Every day | CUTANEOUS | Status: DC
  Administered 2022-03-07 – 2022-03-10 (×4): 6 via TOPICAL

## 2022-03-07 NOTE — Progress Notes (Signed)
Patient has refused any PO medications tonight stating that he is too sick. He has stayed in bed resting quietly with eyes closed.

## 2022-03-07 NOTE — BH Assessment (Addendum)
TTS was placed at 09:17. TTS spoke with the [facility] provider Alan Mulder, NP) about assessment needs after completing a chart review this morning. After further discussions with the [facility] provider, it was decided that the [facility] provider  would complete the patient's behavioral health assessment to determine the patient's needs for disposition.

## 2022-03-07 NOTE — Progress Notes (Signed)
PROGRESS NOTE    Garrett [name]  ZOX:[id number] DOB: [birth year] DOA: 03/03/2022 PCP: Pcp, No   Brief Narrative:   36 y.o. 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  On presentation, chest x-ray was negative for acute abnormality.  He was started on IV Levaquin.  Because of persistent nausea and vomiting, he was switched to IV meropenem.  Assessment & Plan:   UTI -Recent diagnosis of Pseudomonas UTI as an outpatient for which she was placed on oral ciprofloxacin which led to nausea. -Initially started on IV Levaquin but because of persistent nausea and vomiting, antibiotics switched to IV meropenem.  Today is only day 3 of meropenem urine culture on admission has been negative so far.  Nausea -Initially thought to be medication related: Currently on round-the-clock Zofran and as needed Reglan. -Patient also empirically started on IV Diflucan on 03/06/2022 with concerns for oropharyngeal candidiasis. -There might be a behavior component as well as patient is mostly holding his saliva in his mouth and spits it on the floor as per nursing staff  Urinary retention -Patient had recent urinary retention requiring Foley catheter placement which was removed as an outpatient by urology.  He had Foley catheter placement again which was removed in the ED on presentation.  Continue Flomax.   -Continue to have issues with urinary retention requiring in and out catheterization few times and subsequently Foley catheter was placed on 03/04/2022.  Outpatient follow-up with urology.  Elevated LFTs History of chronic hep B -Patient has chronically elevated LFTs.  Right upper quadrant  ultrasound showed mild gallbladder distention with biliary sludge but no signs of cholecystitis.   -LFTs slightly worsened today: Will repeat a.m. LFTs since the patient is currently now on Diflucan as well.   -Outpatient follow-up with ID  Hypokalemia -Improved.  Hyponatremia -Improved.  Still on IV fluids with supplemental potassium.  Repeat a.m. labs.  Thrombocytopenia -Questionable cause.  Resolved.  Diabetes mellitus type 2 with hypoglycemia -Blood sugars currently stable.  DC'd long-acting insulin due to episodes of hypoglycemia.  Currently on D5 drip.  Dysphagia -Questionable cause.  Diet as per SLP recommendations.  History of psychosis and depression -Continue benztropine, escitalopram, gabapentin, paliperidone, lorazepam if able to swallow.  Consult psychiatry/TTS.  DVT prophylaxis: Lovenox Code Status: Full Family Communication: None at bedside Disposition Plan: Status is: Inpatient Remains inpatient appropriate because: Of severity of illness   Consultants: Consult psychiatry Procedures: None  Antimicrobials:  Anti-infectives (From admission, onward)    Start     Dose/Rate Route Frequency Ordered Stop   03/06/22 1530  fluconazole (DIFLUCAN) IVPB 100 mg        100 mg 50 mL/hr over 60 Minutes Intravenous Every 24 hours 03/06/22 1414     03/05/22 2200  meropenem (MERREM) 1 g in sodium chloride 0.9 % 100 mL IVPB        1 g 200 mL/hr over 30 Minutes Intravenous Every 8 hours 03/05/22 0925     03/03/22 1700  levofloxacin (LEVAQUIN) IVPB 750 mg  Status:  Discontinued        750 mg 100 mL/hr over 90 Minutes Intravenous Every 24 hours 03/03/22 1652 03/05/22 0815         Subjective: Patient seen and examined at bedside.  Extremely poor historian.  Sleepy, wakes up slightly, hardly answers any questions.  Nursing staff reports that patient refused p.o. medications last night.  No agitation, seizures or chest pain reported  objective: Vitals:   03/06/22 0525  03/06/22 1502 03/06/22 2048 03/07/22 0521  BP: (!) 124/92 (!) 122/90 111/89 (!) 81/65  Pulse: 72 77 81 78  Resp: 16 20 19 19   Temp: 98.2 F (36.8 C) 98.6 F (37 C) 98 F (36.7 C) 98 F (36.7 C)  TempSrc: Oral Oral    SpO2: 100% 100% 100% 100%  Weight:      Height:        Intake/Output Summary (Last 24 hours) at 03/07/2022 0734 Last data filed at 03/07/2022 14/08/2021 Gross per 24 hour  Intake 345.57 ml  Output 800 ml  Net -454.43 ml    Filed Weights   03/03/22 1540  Weight: 49 kg    Examination:  General: On room air.  No distress.  Looks chronically ill and deconditioned. ENT/neck: No obvious JVD elevation or palpable neck masses respiratory: Decreased breath sounds at bases bilaterally with some crackles CVS: S1-S2 heard; mostly rate controlled abdominal: Soft, nontender, still slightly distended; no organomegaly, bowel sounds heard normally  extremities: Trace lower extremity edema present; no cyanosis  CNS: Sleepy, wakes up slightly, hardly answers any questions.  Extremely poor historian.  Moving extremities.   Lymph: No obvious cervical lymphadenopathy noted  skin: No obvious lesions/ecchymosis  psych: Flat affect.  No signs of agitation currently. Musculoskeletal: No obvious joint erythema/tenderness/swelling   Data Reviewed: I have personally reviewed following labs and imaging studies  CBC: Recent Labs  Lab 03/03/22 1720 03/04/22 0413 03/05/22 0600 03/06/22 0532 03/07/22 0545  WBC 6.9 6.1 5.1 4.7 5.1  NEUTROABS  --   --  3.1 1.8 1.8  HGB 13.6 10.8* 11.2* 11.5* 11.5*  HCT 41.8 33.1* 33.7* 33.7* 34.2*  MCV 95.4 95.7 91.3 89.9 92.7  PLT 161 122* 127* 133* 150    Basic Metabolic Panel: Recent Labs  Lab 03/03/22 1720 03/04/22 0413 03/05/22 0600 03/06/22 0532 03/07/22 0545  NA 144 142 132* 133* 136  K 3.5 3.1* 2.6* 3.4* 3.9  CL 104 109 98 102 105  CO2 31 26 29 28 25   GLUCOSE 99 73 131* 92 132*  BUN 17 12 6  <5* <5*  CREATININE 0.79 0.60* 0.62 0.57*  0.68  CALCIUM 8.6* 7.5* 7.5* 7.9* 7.8*  MG  --   --  1.8 1.8 2.1    GFR: Estimated Creatinine Clearance: 86.8 mL/min (by C-G formula based on SCr of 0.68 mg/dL). Liver Function Tests: Recent Labs  Lab 03/03/22 1720 03/04/22 0413 03/05/22 0600 03/06/22 0532 03/07/22 0545  AST 151* 101* 100* 111* 153*  ALT 155* 109* 101* 105* 136*  ALKPHOS 54 40 39 42 46  BILITOT 1.6* 1.4* 2.0* 1.7* 1.8*  PROT 8.9* 6.8 6.7 6.9 7.0  ALBUMIN 3.3* 2.6* 2.5* 2.7* 2.7*    No results for input(s): "LIPASE", "AMYLASE" in the last 168 hours.  Recent Labs  Lab 03/06/22 0532  AMMONIA 31    Coagulation Profile: No results for input(s): "INR", "PROTIME" in the last 168 hours. Cardiac Enzymes: No results for input(s): "CKTOTAL", "CKMB", "CKMBINDEX", "TROPONINI" in the last 168 hours. BNP (last 3 results) No results for input(s): "PROBNP" in the last 8760 hours. HbA1C: No results for input(s): "HGBA1C" in the last 72 hours. CBG: Recent Labs  Lab 03/05/22 2123 03/06/22 0806 03/06/22 1136 03/06/22 1745 03/06/22 2051  GLUCAP  111* 111* 129* 104* 102*    Lipid Profile: No results for input(s): "CHOL", "HDL", "LDLCALC", "TRIG", "CHOLHDL", "LDLDIRECT" in the last 72 hours. Thyroid Function Tests: Recent Labs    03/06/22 0532  TSH 3.794    Anemia Panel: Recent Labs    03/06/22 0532  VITAMINB12 2,330*   Sepsis Labs: No results for input(s): "PROCALCITON", "LATICACIDVEN" in the last 168 hours.   Recent Results (from the past 240 hour(s))  Urine Culture     Status: Abnormal   Collection Time: 02/25/22 10:42 PM   Specimen: Urine, Catheterized  Result Value Ref Range Status   Specimen Description   Final    URINE, CATHETERIZED Performed at [facility], [address] [place], [place] [address]    Special Requests   Final    NONE Performed at [facility], [address] [place], [place] [address]    Culture (A)  Final    >=100,000 COLONIES/mL PSEUDOMONAS AERUGINOSA 20,000  COLONIES/mL STAPHYLOCOCCUS AUREUS    Report Status 03/01/2022 FINAL  Final   Organism ID, Bacteria PSEUDOMONAS AERUGINOSA (A)  Final   Organism ID, Bacteria STAPHYLOCOCCUS AUREUS (A)  Final      Susceptibility   Pseudomonas aeruginosa - MIC*    CEFTAZIDIME 16 INTERMEDIATE Intermediate     CIPROFLOXACIN <=0.25 SENSITIVE Sensitive     GENTAMICIN <=1 SENSITIVE Sensitive     IMIPENEM 2 SENSITIVE Sensitive     * >=100,000 COLONIES/mL PSEUDOMONAS AERUGINOSA   Staphylococcus aureus - MIC*    CIPROFLOXACIN <=0.5 SENSITIVE Sensitive     GENTAMICIN <=0.5 SENSITIVE Sensitive     NITROFURANTOIN <=16 SENSITIVE Sensitive     OXACILLIN 0.5 SENSITIVE Sensitive     TETRACYCLINE <=1 SENSITIVE Sensitive     VANCOMYCIN <=0.5 SENSITIVE Sensitive     TRIMETH/SULFA <=10 SENSITIVE Sensitive     CLINDAMYCIN <=0.25 SENSITIVE Sensitive     RIFAMPIN <=0.5 SENSITIVE Sensitive     Inducible Clindamycin NEGATIVE Sensitive     * 20,000 COLONIES/mL STAPHYLOCOCCUS AUREUS  Urine Culture     Status: None   Collection Time: 03/04/22  1:24 PM   Specimen: Urine, Clean Catch  Result Value Ref Range Status   Specimen Description   Final    URINE, CLEAN CATCH Performed at [facility], [address] [place], [place] [address]    Special Requests   Final    NONE Performed at [facility], [address] [place], [place] [address]    Culture   Final    NO GROWTH Performed at [facility], [address], [place], [place] [address]    Report Status 03/05/2022 FINAL  Final         Radiology Studies: No results found.      Scheduled Meds:  benztropine  0.5 mg Oral BID   enoxaparin (LOVENOX) injection  40 mg Subcutaneous Q24H   escitalopram  10 mg Oral Daily   gabapentin  100 mg Oral BID   LORazepam  0.5 mg Oral QHS   ondansetron (ZOFRAN) IV  4 mg Intravenous Q6H   paliperidone  3 mg Oral QHS   tamsulosin  0.4 mg Oral Daily   Continuous Infusions:  dextrose 5 % and 0.9 % NaCl with KCl 40  mEq/L 75 mL/hr at 03/06/22 1714   fluconazole (DIFLUCAN) IV 100 mg (03/06/22 1752)   meropenem (MERREM) IV 1 g (03/07/22 0550)          Glade Lloyd, MD [facility] 03/07/2022, 7:34 AM

## 2022-03-07 NOTE — Consult Note (Signed)
Telepsych Consultation   Reason for Consult: Patient has multiple psych meds, but refusing p.o. meds and treatment  Referring Physician: Dr. Hanley Ben  Location of Patient: [facility]  Location of Provider: Behavioral Health TTS Department  Patient Identification: Garrett [name]  MRN:  [id number]  Principal Diagnosis: UTI (urinary tract infection)  Diagnosis:  Active Problems:   MDD (major depressive disorder), recurrent, severe, with psychosis (HCC)   Controlled type 2 diabetes mellitus without complication, with long-term current use of insulin (HCC)   Elevated LFTs   Chronic Hepatitis B  Total Time spent with patient: 45 minutes  Subjective:   Garrett [name] is a 36 y.o. male patient admitted with complaint of nausea and with history significant for depression with psychosis and on antipsychotic and antidepressants medications.       Assessment: Patient was seen and examined via telepsych using a [place] video interpreter.  Patient speaks very minimal [place].  Interpreter does most the communication.  Alert and oriented x 3, speech mainly yes or no responses, low in volume and pattern.  Maintain good eye contact with the provider.  When asked what brought the patient in to the hospital, responded abdominal pain and nausea.  Reports abdominal pain with nausea started in October 2023 and still continues.  Reports he does not know what's the trigger for the abdominal pain.  Present with euthymic mood and affect congruent.  No report of pain during the encounter however reports poor appetite due to nausea.  Patient does not appear to be responding to internal or external stimuli during the encounter.  Denies depressive symptoms, mania, OCD or PTSD symptoms.  Pertinent lab reviewed indicates UDS negative for any substance, BAL less than 10.  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  In June 01, 2017 he presented for depression and poor self-care and was treated with Haldol, and Celexa.  In March 09, 2020 he was brought in by his son for severe major depressive disorder with psychotic features at that time CT of the head was negative.  On July 11, 2020 he presented for medical screening exam due to depression and decline in ADLs.  On March 04, 2021 patient was started on Invega 3 mg p.o. at night, Lexapro and Cogentin.  Risperdal was discontinued due to elevated liver function test.  Patient denies SI, HI, AVH, however added I used to hear voices long time ago but not recently.  Denies symptoms of depression, mania, PTSD.  Reports sleeping on and off for 6 hours.  Reports poor appetite due to nausea.  Reported being safe at the group home, with no access to firearms.  Denies self-injurious behavior or suicide attempt.  Denies drug use, alcohol use, tobacco use or marijuana use.  Reports seeing a therapist/psychiatrist only when hospitalized at behavioral health.  Denies family history of mental illness.  Patient appears to be minimizing his symptoms, which could be due to language barrier.  Supportive therapy provided about ongoing stressors.  HPI:   Garrett [name] is a 36 y.o. 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  History is obtained with use of interpreter.  Patient is a ward of the state and lives in a group home.  He was sent to the hospital due to nausea and vomiting.  He was diagnosed with a UTI about 5 days ago  and was prescribed an antibiotic.  This antibiotic was changed to ciprofloxacin because the urine culture grew out Pseudomonas.  Unfortunately, every time he takes his antibiotic, he is unable to keep anything down and vomits.  His appetite has been poor.  He reports no fevers or chills.  He is come to the hospital for evaluation.    Disposition: Based on my evaluation of patient with complicated medical history of UTI, nausea, urinary retention, elevated LFTs, hypokalemia improved, hyponatremia improved, thrombocytopenia resolved, diabetes mellitus type 2, blood sugars currently stable, dysphagia with questionable cause, he appears to be minimizing his symptoms, possibly due to language barrier.  He meets the criteria for inpatient psychiatric admission when medically cleared.  [facility] treatment team made aware of patient disposition.  Past Psychiatric History: History of major depressive disorder recurrent severe with psychosis, history of major depressive disorder recurrent, moderate with mood congruent psychotic features, major depressive disorder recurrent severe without psychotic features.  Risk to Self: No  Risk to Others: No   Prior Inpatient Therapy: Yes  Prior Outpatient Therapy: Yes  Past Medical History:  Past Medical History:  Diagnosis Date   Acute metabolic encephalopathy 03/09/2020   Altered mental status    Anemia 05/05/2021   Chronic Hepatitis B 05/12/2021   Chronic hepatitis B without hepatic coma (HCC) 01/14/2022   Depression    Diabetes mellitus (HCC)    Elevated LFTs 03/09/2020   Hypoalbuminemia 01/24/2022   Hyponatremia 01/24/2022   Hypotension 01/26/2022   Major depressive disorder, recurrent episode, moderate with mood-congruent psychotic features (HCC)    Major depressive disorder, recurrent severe without psychotic features (HCC) 06/20/2017   Physical deconditioning    Postoperative intra-abdominal abscess 01/20/2022   Severe malnutrition (HCC) 01/18/2022   Severe malnutrition (HCC) 01/18/2022    Past Surgical History:  Procedure Laterality Date   APPENDECTOMY     EXPLORATORY LAPAROTOMY     Family History: History reviewed. No pertinent family history.  Family Psychiatric  History: None indicated  Social History:  Social History   Substance and Sexual Activity  Alcohol  Use Not Currently   Comment: occassionally     Social History   Substance and Sexual Activity  Drug Use Never    Social History   Socioeconomic History   Marital status: Single    Spouse name: Not on file   Number of children: Not on file   Years of education: Not on file   Highest education level: Not on file  Occupational History   Not on file  Tobacco Use   Smoking status: Former    Types: Cigarettes   Smokeless tobacco: Never  Substance and Sexual Activity   Alcohol use: Not Currently    Comment: occassionally   Drug use: Never   Sexual activity: Not on file  Other Topics Concern   Not on file  Social History Narrative   Not on file   Social Determinants of Health   Financial Resource Strain: Not on file  Food Insecurity: Not on file  Transportation Needs: Not on file  Physical Activity: Not on file  Stress: Not on file  Social Connections: Not on file   Additional Social History:   Allergies:  No Known Allergies  Labs:  Results for orders placed or performed during the hospital encounter of 03/03/22 (from the past 48 hour(s))  Glucose, capillary     Status: Abnormal   Collection Time: 03/05/22  9:23 PM  Result Value Ref Range   Glucose-Capillary 111 (H)  70 - 99 mg/dL    Comment: Glucose reference range applies only to samples taken after fasting for at least 8 hours.  CBC with Differential/Platelet     Status: Abnormal   Collection Time: 03/06/22  5:32 AM  Result Value Ref Range   WBC 4.7 4.0 - 10.5 K/uL   RBC 3.75 (L) 4.22 - 5.81 MIL/uL   Hemoglobin 11.5 (L) 13.0 - 17.0 g/dL   HCT 52.8 (L) 41.3 - 24.4 %   MCV 89.9 80.0 - 100.0 fL   MCH 30.7 26.0 - 34.0 pg   MCHC 34.1 30.0 - 36.0 g/dL   RDW 01.0 27.2 - 53.6 %   Platelets 133 (L) 150 - 400 K/uL   nRBC 0.0 0.0 - 0.2 %   Neutrophils Relative % 39 %   Neutro Abs 1.8 1.7 - 7.7 K/uL   Lymphocytes Relative 46 %   Lymphs Abs 2.1 0.7 - 4.0 K/uL   Monocytes Relative 11 %   Monocytes Absolute 0.5 0.1 -  1.0 K/uL   Eosinophils Relative 4 %   Eosinophils Absolute 0.2 0.0 - 0.5 K/uL   Basophils Relative 0 %   Basophils Absolute 0.0 0.0 - 0.1 K/uL   Immature Granulocytes 0 %   Abs Immature Granulocytes 0.02 0.00 - 0.07 K/uL    Comment: Performed at [facility], [address] [place], [place] [address]  Comprehensive metabolic panel     Status: Abnormal   Collection Time: 03/06/22  5:32 AM  Result Value Ref Range   Sodium 133 (L) 135 - 145 mmol/L   Potassium 3.4 (L) 3.5 - 5.1 mmol/L    Comment: DELTA CHECK NOTED   Chloride 102 98 - 111 mmol/L   CO2 28 22 - 32 mmol/L   Glucose, Bld 92 70 - 99 mg/dL    Comment: Glucose reference range applies only to samples taken after fasting for at least 8 hours.   BUN <5 (L) 6 - 20 mg/dL   Creatinine, Ser 4.74 (L) 0.61 - 1.24 mg/dL   Calcium 7.9 (L) 8.9 - 10.3 mg/dL   Total Protein 6.9 6.5 - 8.1 g/dL   Albumin 2.7 (L) 3.5 - 5.0 g/dL   AST 259 (H) 15 - 41 U/L   ALT 105 (H) 0 - 44 U/L   Alkaline Phosphatase 42 38 - 126 U/L   Total Bilirubin 1.7 (H) 0.3 - 1.2 mg/dL   GFR, Estimated >56 >38 mL/min    Comment: (NOTE) Calculated using the CKD-EPI Creatinine Equation (2021)    Anion gap 3 (L) 5 - 15    Comment: Performed at [facility], [address] [place], [place] [address]  Magnesium     Status: None   Collection Time: 03/06/22  5:32 AM  Result Value Ref Range   Magnesium 1.8 1.7 - 2.4 mg/dL    Comment: Performed at [facility], [address] [place], [place] [address]  Vitamin B12     Status: Abnormal   Collection Time: 03/06/22  5:32 AM  Result Value Ref Range   Vitamin B-12 2,330 (H) 180 - 914 pg/mL    Comment: RESULTS CONFIRMED BY MANUAL DILUTION Performed at [facility], [address] [place], [place] [address]   TSH     Status: None   Collection Time: 03/06/22  5:32 AM  Result Value Ref Range   TSH 3.794 0.350 - 4.500 uIU/mL    Comment: Performed by a 3rd Generation assay with a functional sensitivity of <=0.01  uIU/mL. Performed at [facility], [address], [place], [place] [address]   Ammonia     Status: None   Collection Time: 03/06/22  5:32 AM  Result Value Ref Range   Ammonia 31 9 - 35 umol/L    Comment: Performed at [facility], [address] [place], [place] [address]  Glucose, capillary     Status: Abnormal   Collection Time: 03/06/22  8:06 AM  Result Value Ref Range   Glucose-Capillary 111 (H) 70 - 99 mg/dL    Comment: Glucose reference range applies only to samples taken after fasting for at least 8 hours.  Glucose, capillary     Status: Abnormal   Collection Time: 03/06/22 11:36 AM  Result Value Ref Range   Glucose-Capillary 129 (H) 70 - 99 mg/dL    Comment: Glucose reference range applies only to samples taken after fasting for at least 8 hours.  Glucose, capillary     Status: Abnormal   Collection Time: 03/06/22  5:45 PM  Result Value Ref Range   Glucose-Capillary 104 (H) 70 - 99 mg/dL    Comment: Glucose reference range applies only to samples taken after fasting for at least 8 hours.  Glucose, capillary     Status: Abnormal   Collection Time: 03/06/22  8:51 PM  Result Value Ref Range   Glucose-Capillary 102 (H) 70 - 99 mg/dL    Comment: Glucose reference range applies only to samples taken after fasting for at least 8 hours.  CBC with Differential/Platelet     Status: Abnormal   Collection Time: 03/07/22  5:45 AM  Result Value Ref Range   WBC 5.1 4.0 - 10.5 K/uL   RBC 3.69 (L) 4.22 - 5.81 MIL/uL   Hemoglobin 11.5 (L) 13.0 - 17.0 g/dL   HCT 09.8 (L) 11.9 - 14.7 %   MCV 92.7 80.0 - 100.0 fL   MCH 31.2 26.0 - 34.0 pg   MCHC 33.6 30.0 - 36.0 g/dL   RDW 82.9 56.2 - 13.0 %   Platelets 150 150 - 400 K/uL   nRBC 0.0 0.0 - 0.2 %   Neutrophils Relative % 35 %   Neutro Abs 1.8 1.7 - 7.7 K/uL   Lymphocytes Relative 47 %   Lymphs Abs 2.4 0.7 - 4.0 K/uL   Monocytes Relative 13 %   Monocytes Absolute 0.7 0.1 - 1.0 K/uL   Eosinophils Relative 4 %   Eosinophils Absolute 0.2  0.0 - 0.5 K/uL   Basophils Relative 1 %   Basophils Absolute 0.0 0.0 - 0.1 K/uL   Immature Granulocytes 0 %   Abs Immature Granulocytes 0.01 0.00 - 0.07 K/uL    Comment: Performed at [facility], [address], [place], [place] [address]  Comprehensive metabolic panel     Status: Abnormal   Collection Time: 03/07/22  5:45 AM  Result Value Ref Range   Sodium 136 135 - 145 mmol/L   Potassium 3.9 3.5 - 5.1 mmol/L   Chloride 105 98 - 111 mmol/L   CO2 25 22 - 32 mmol/L   Glucose, Bld 132 (H) 70 - 99 mg/dL    Comment: Glucose reference range applies only to samples taken after fasting for at least 8 hours.   BUN <5 (L) 6 - 20 mg/dL   Creatinine, Ser 4.69 0.61 - 1.24 mg/dL   Calcium 7.8 (L) 8.9 - 10.3 mg/dL   Total Protein 7.0 6.5 - 8.1 g/dL   Albumin 2.7 (L) 3.5 - 5.0 g/dL  AST 153 (H) 15 - 41 U/L   ALT 136 (H) 0 - 44 U/L   Alkaline Phosphatase 46 38 - 126 U/L   Total Bilirubin 1.8 (H) 0.3 - 1.2 mg/dL   GFR, Estimated >16>60 >10>60 mL/min    Comment: (NOTE) Calculated using the CKD-EPI Creatinine Equation (2021)    Anion gap 6 5 - 15    Comment: Performed at [facility], [address] [place], [place] [address]  Magnesium     Status: None   Collection Time: 03/07/22  5:45 AM  Result Value Ref Range   Magnesium 2.1 1.7 - 2.4 mg/dL    Comment: Performed at [facility], [address] [place], [place] [address]  Glucose, capillary     Status: Abnormal   Collection Time: 03/07/22  7:39 AM  Result Value Ref Range   Glucose-Capillary 125 (H) 70 - 99 mg/dL    Comment: Glucose reference range applies only to samples taken after fasting for at least 8 hours.  Glucose, capillary     Status: Abnormal   Collection Time: 03/07/22 11:21 AM  Result Value Ref Range   Glucose-Capillary 127 (H) 70 - 99 mg/dL    Comment: Glucose reference range applies only to samples taken after fasting for at least 8 hours.    Medications:  Current Facility-Administered Medications  Medication Dose Route  Frequency Provider Last Rate Last Admin   benztropine (COGENTIN) tablet 0.5 mg  0.5 mg Oral BID Levie HeritageStinson, Jacob J, DO   0.5 mg at 03/07/22 [id number]   Chlorhexidine Gluconate Cloth 2 % PADS 6 each  6 each Topical Daily Glade LloydAlekh, Kshitiz, MD   6 each at 03/07/22 1158   dextrose 5 % and 0.9 % NaCl with KCl 40 mEq/L infusion   Intravenous Continuous Glade LloydAlekh, Kshitiz, MD 75 mL/hr at 03/06/22 1714 New Bag at 03/06/22 1714   enoxaparin (LOVENOX) injection 40 mg  40 mg Subcutaneous Q24H Levie HeritageStinson, Jacob J, DO   40 mg at 03/07/22 0944   escitalopram (LEXAPRO) tablet 10 mg  10 mg Oral Daily Levie HeritageStinson, Jacob J, DO   10 mg at 03/07/22 0944   fluconazole (DIFLUCAN) IVPB 100 mg  100 mg Intravenous Q24H Glade LloydAlekh, Kshitiz, MD 50 mL/hr at 03/07/22 1549 100 mg at 03/07/22 1549   gabapentin (NEURONTIN) capsule 100 mg  100 mg Oral BID Levie HeritageStinson, Jacob J, DO   100 mg at 03/07/22 0944   LORazepam (ATIVAN) tablet 0.5 mg  0.5 mg Oral QHS Stinson, Jacob J, DO       meropenem (MERREM) 1 g in sodium chloride 0.9 % 100 mL IVPB  1 g Intravenous Q8H Madueme, Elvira C, RPH 200 mL/hr at 03/07/22 1415 1 g at 03/07/22 1415   metoCLOPramide (REGLAN) injection 10 mg  10 mg Intravenous Q6H PRN Glade LloydAlekh, Kshitiz, MD   10 mg at 03/06/22 2154   ondansetron (ZOFRAN) injection 4 mg  4 mg Intravenous Q6H PRN Glade LloydAlekh, Kshitiz, MD       paliperidone (INVEGA) 24 hr tablet 3 mg  3 mg Oral QHS Stinson, Jacob J, DO       tamsulosin (FLOMAX) capsule 0.4 mg  0.4 mg Oral Daily Levie HeritageStinson, Jacob J, DO   0.4 mg at 03/07/22 [id number]    Musculoskeletal: Strength & Muscle Tone: within normal limits Gait & Station: normal Patient leans: N/A  Psychiatric Specialty Exam:  Presentation  General Appearance:  Casual  Eye Contact: Good  Speech: Slow (Speech slow in volume and requiring an interpreter and Asian  language.)  Speech Volume: Decreased  Handedness: Right  Mood and Affect  Mood: Euthymic  Affect: Congruent  Thought Process  Thought  Processes: Coherent  Descriptions of Associations:Intact  Orientation:Full (Time, Place and Person)  Thought Content:Logical  History of Schizophrenia/Schizoaffective disorder:No  Duration of Psychotic Symptoms:N/A  Hallucinations:Hallucinations: None  Ideas of Reference:None  Suicidal Thoughts:Suicidal Thoughts: No  Homicidal Thoughts:Homicidal Thoughts: No  Sensorium  Memory: Immediate Fair; Recent Fair; Remote Fair  Judgment: Fair  Insight: Fair  Art therapist  Concentration: Good  Attention Span: Good  Recall: Good  Fund of Knowledge: Fair  Language: Fair (Interpreter used during the encounter)  Psychomotor Activity  Psychomotor Activity: Psychomotor Activity: Normal  Assets  Assets: Manufacturing systems engineer; Desire for Improvement; Resilience  Sleep  Sleep: Sleep: Good Number of Hours of Sleep: 6  Physical Exam: Physical Exam Vitals and nursing note reviewed.    Review of Systems  Constitutional: Negative.   HENT: Negative.    Eyes: Negative.   Respiratory: Negative.    Cardiovascular: Negative.   Gastrointestinal:  Positive for abdominal pain, nausea and vomiting.  Genitourinary: Negative.   Musculoskeletal: Negative.   Skin: Negative.   Neurological: Negative.   Endo/Heme/Allergies: Negative.   Psychiatric/Behavioral:         Patient has history of major depressive disorder with psychosis.  However no current symptoms at this time.  Also has history of hallucinations but had not experiencing years.   Blood pressure (!) 81/65, pulse 78, temperature 98 F (36.7 C), resp. rate 19, height 5\' 8"  (1.727 m), weight 49 kg, SpO2 100 %. Body mass index is 16.43 kg/m.  Treatment Plan Summary: Daily contact with patient to assess and evaluate symptoms and progress in treatment and Medication management  Plan: Psychosis: --Continue Invega 3 mg p.o. nightly.  If able to tolerate p.o.'s -- Continue Cogentin 0.5 mg p.o. twice daily.   If able to tolerate p.o.'s  Major depressive mood disorder severe with psychotic features: -- Continue Escitalopram (Lexapro) 10 mg p.o. daily.  If able to tolerate p.o.'s -- Gabapentin 100 mg p.o. twice daily.  If able to tolerate p.o.'s  Anxiety: -- Lorazepam (Ativan) 0.5 mg p.o. nightly.  If able to tolerate p.o.'s  Nausea: Currently on round-the-clock Zofran and as needed Reglan. -Patient also empirically started on IV Diflucan on 03/06/2022 with concerns for oropharyngeal candidiasis. -There might be a behavior component as well as patient is mostly holding his saliva in his mouth and spits it on the floor as per nursing staff  Disposition: Recommends psychiatric inpatient admission when medically  cleared.  This service was provided via telemedicine using a 2-way, interactive audio and video technology.  Names of all persons participating in this telemedicine service and their role in this encounter. Name: Garrett [name] Role: Patient  Name: 14/07/2021 Role: NP provider  Name: Dr. Alan Mulder Role: Medical Director  Name:  Role: Lucianne Muss ED physician    Onalee Hua, FNP 03/07/2022 4:57 PM

## 2022-03-07 NOTE — Progress Notes (Signed)
PT Cancellation Note  Patient Details Name: Garrett [name] MRN: [id number] DOB: 05/06/1985   Cancelled Treatment:   Reason Eval/Treat Not Completed: Patient declined, no reason specified;Other (comment) (Pt declined due to tiredness.)  Interpreter used: [id number] Language: [name]  Pt was refusing PT treatment session today due to "tiredness." Attempted further to perform treatment session with pt, with education on importance of mobility and its positive benefits. Pt continuing to refuse and requesting tomorrow.  Nelida Meuse 03/07/2022, 10:18 AM

## 2022-03-07 NOTE — Progress Notes (Signed)
Use of interpreter services for assessment/plan.

## 2022-03-08 DIAGNOSIS — R112 Nausea with vomiting, unspecified: Secondary | ICD-10-CM

## 2022-03-08 DIAGNOSIS — R7989 Other specified abnormal findings of blood chemistry: Secondary | ICD-10-CM | POA: Diagnosis not present

## 2022-03-08 DIAGNOSIS — R7401 Elevation of levels of liver transaminase levels: Secondary | ICD-10-CM

## 2022-03-08 DIAGNOSIS — N3000 Acute cystitis without hematuria: Secondary | ICD-10-CM | POA: Diagnosis not present

## 2022-03-08 DIAGNOSIS — N3001 Acute cystitis with hematuria: Secondary | ICD-10-CM | POA: Diagnosis not present

## 2022-03-08 DIAGNOSIS — E43 Unspecified severe protein-calorie malnutrition: Secondary | ICD-10-CM

## 2022-03-08 DIAGNOSIS — E1165 Type 2 diabetes mellitus with hyperglycemia: Secondary | ICD-10-CM

## 2022-03-08 DIAGNOSIS — R5381 Other malaise: Secondary | ICD-10-CM

## 2022-03-08 DIAGNOSIS — E119 Type 2 diabetes mellitus without complications: Secondary | ICD-10-CM | POA: Diagnosis not present

## 2022-03-08 LAB — CBC WITH DIFFERENTIAL/PLATELET
Abs Immature Granulocytes: 0.01 10*3/uL (ref 0.00–0.07)
Basophils Absolute: 0 10*3/uL (ref 0.0–0.1)
Basophils Relative: 1 %
Eosinophils Absolute: 0.2 10*3/uL (ref 0.0–0.5)
Eosinophils Relative: 4 %
HCT: 34.2 % — ABNORMAL LOW (ref 39.0–52.0)
Hemoglobin: 11.4 g/dL — ABNORMAL LOW (ref 13.0–17.0)
Immature Granulocytes: 0 %
Lymphocytes Relative: 47 %
Lymphs Abs: 2.1 10*3/uL (ref 0.7–4.0)
MCH: 31.1 pg (ref 26.0–34.0)
MCHC: 33.3 g/dL (ref 30.0–36.0)
MCV: 93.4 fL (ref 80.0–100.0)
Monocytes Absolute: 0.5 10*3/uL (ref 0.1–1.0)
Monocytes Relative: 11 %
Neutro Abs: 1.6 10*3/uL — ABNORMAL LOW (ref 1.7–7.7)
Neutrophils Relative %: 37 %
Platelets: 124 10*3/uL — ABNORMAL LOW (ref 150–400)
RBC: 3.66 MIL/uL — ABNORMAL LOW (ref 4.22–5.81)
RDW: 13.7 % (ref 11.5–15.5)
WBC: 4.4 10*3/uL (ref 4.0–10.5)
nRBC: 0 % (ref 0.0–0.2)

## 2022-03-08 LAB — COMPREHENSIVE METABOLIC PANEL
ALT: 140 U/L — ABNORMAL HIGH (ref 0–44)
AST: 158 U/L — ABNORMAL HIGH (ref 15–41)
Albumin: 2.5 g/dL — ABNORMAL LOW (ref 3.5–5.0)
Alkaline Phosphatase: 43 U/L (ref 38–126)
Anion gap: 2 — ABNORMAL LOW (ref 5–15)
BUN: 5 mg/dL — ABNORMAL LOW (ref 6–20)
CO2: 28 mmol/L (ref 22–32)
Calcium: 7.9 mg/dL — ABNORMAL LOW (ref 8.9–10.3)
Chloride: 106 mmol/L (ref 98–111)
Creatinine, Ser: 0.78 mg/dL (ref 0.61–1.24)
GFR, Estimated: >60 mL/min (ref >60)
Glucose, Bld: 83 mg/dL (ref 70–99)
Potassium: 4 mmol/L (ref 3.5–5.1)
Sodium: 136 mmol/L (ref 135–145)
Total Bilirubin: 1.1 mg/dL (ref 0.3–1.2)
Total Protein: 6.5 g/dL (ref 6.5–8.1)

## 2022-03-08 LAB — GLUCOSE, CAPILLARY
Glucose-Capillary: 114 mg/dL — ABNORMAL HIGH (ref 70–99)
Glucose-Capillary: 103 mg/dL — ABNORMAL HIGH (ref 70–99)
Glucose-Capillary: 105 mg/dL — ABNORMAL HIGH (ref 70–99)

## 2022-03-08 LAB — CORTISOL: Cortisol, Plasma: 9.2 ug/dL

## 2022-03-08 LAB — MAGNESIUM: Magnesium: 2 mg/dL (ref 1.7–2.4)

## 2022-03-08 MED ORDER — HYDROCORTISONE 5 MG PO TABS
5.0000 mg | ORAL_TABLET | Freq: Every day | ORAL | Status: DC
  Administered 2022-03-09 – 2022-03-10 (×3): 5 mg via ORAL
  Filled 2022-03-08 (×6): qty 1

## 2022-03-08 MED ORDER — FLUDROCORTISONE 0.1 MG/ML ORAL SUSPENSION
0.0500 mg | Freq: Two times a day (BID) | ORAL | Status: DC
  Filled 2022-03-08 (×4): qty 0.5

## 2022-03-08 MED ORDER — SODIUM CHLORIDE 0.9 % IV BOLUS
500.0000 mL | Freq: Once | INTRAVENOUS | Status: AC
  Administered 2022-03-08: 500 mL via INTRAVENOUS

## 2022-03-08 NOTE — Progress Notes (Signed)
Pt bed alarm sounded.  Pt sitting at bedside, states, "go walking."  Attempted to get pt up with walker, pt flopped down on bed.  Says, "I get dizzy."  Advised pt to rest at this time, encouraged hydration.  Encouraged pt to work with PT today when they come in for session.

## 2022-03-08 NOTE — Progress Notes (Signed)
Patient attempting to get OOB at times without assistance, bed alarm is on , call bell education provided to patient, he verbalized understanding. Will continue to monitor patient.

## 2022-03-08 NOTE — Progress Notes (Signed)
Physical Therapy Treatment Patient Details Name: Garrett [name] MRN: [id number] DOB: [birth year] Today's Date: 03/08/2022   History of Present Illness Garrett [name] is a 36 y.o. 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  History is obtained with use of interpreter.  Patient is a ward of the state and lives in a group home.  He was sent to the hospital due to nausea and vomiting.  He was diagnosed with a UTI about 5 days ago and was prescribed an antibiotic.  This antibiotic was changed to ciprofloxacin because the urine culture grew out Pseudomonas.  Unfortunately, every time he takes his antibiotic, he is unable to keep anything down and vomits.  His appetite has been poor.  He reports no fevers or chills.  He is come to the hospital for evaluation.    PT Comments    Patient agreeable for therapy and able to follow instructions consistently when given instructions in English and able to express needs and answer questions in [place].  Patient demonstrates slightly labored movement for sitting up at bedside, required use of RW for gait training for safety and limited mostly due to uncontrollable buckling of knees once fatigued with near fall requiring chair brought up to patient to sit.  Patient demonstrates fair/good return for completing BLE ROM/strengthening exercises requiring verbal cueing and demonstration.  Patient tolerated sitting up in chair after therapy with BP at 75/58 - RN, LPN notified.  Patient will benefit from continued skilled physical therapy in hospital and recommended venue below to increase strength, balance, endurance for safe ADLs and gait.      Recommendations for follow up therapy are one component of a multi-disciplinary discharge planning process, led by the attending physician.  Recommendations may be updated based on patient status, additional functional criteria  and insurance authorization.  Follow Up Recommendations  Home health PT     Assistance Recommended at Discharge Intermittent Supervision/Assistance  Patient can return home with the following A little help with walking and/or transfers;A little help with bathing/dressing/bathroom;Help with stairs or ramp for entrance;Assistance with cooking/housework   Equipment Recommendations  Rolling walker (2 wheels)    Recommendations for Other Services       Precautions / Restrictions Precautions Precautions: Fall Restrictions Weight Bearing Restrictions: No     Mobility  Bed Mobility Overal bed mobility: Modified Independent             General bed mobility comments: slightly labored movement    Transfers Overall transfer level: Needs assistance Equipment used: Rolling walker (2 wheels) Transfers: Sit to/from Stand, Bed to chair/wheelchair/BSC Sit to Stand: Min guard   Step pivot transfers: Min guard, Min assist       General transfer comment: unsteady labored movement    Ambulation/Gait Ambulation/Gait assistance: Mod assist Gait Distance (Feet): 20 Feet Assistive device: Rolling walker (2 wheels) Gait Pattern/deviations: Decreased step length - right, Decreased step length - left, Decreased stride length, Knees buckling, Staggering right, Staggering left Gait velocity: decreased     General Gait Details: slow labored cadence with frequent bumping into nearby objects, once fatigued patient uncontrollable buckling of knees requiring chair pulled up to patient to avoid falling   Stairs             Wheelchair Mobility    Modified Rankin (Stroke Patients Only)       Balance Overall balance assessment: Needs assistance Sitting-balance support: Feet supported, No upper extremity  supported Sitting balance-Leahy Scale: Fair Sitting balance - Comments: fair/good seated at EOB   Standing balance support: During functional activity, No upper extremity  supported Standing balance-Leahy Scale: Poor Standing balance comment: using RW                            Cognition Arousal/Alertness: Awake/alert Behavior During Therapy: WFL for tasks assessed/performed Overall Cognitive Status: Within Functional Limits for tasks assessed                                          Exercises General Exercises - Lower Extremity Ankle Circles/Pumps: Seated, AROM, Strengthening, 10 reps Long Arc Quad: Seated, AROM, Strengthening, 10 reps Hip Flexion/Marching: Seated, AROM, Strengthening, 10 reps    General Comments        Pertinent Vitals/Pain Pain Assessment Pain Assessment: No/denies pain    Home Living                          Prior Function            PT Goals (current goals can now be found in the care plan section) Acute Rehab PT Goals Patient Stated Goal: return home PT Goal Formulation: With patient Time For Goal Achievement: 03/10/22 Potential to Achieve Goals: Good Progress towards PT goals: Progressing toward goals    Frequency    Min 3X/week      PT Plan Current plan remains appropriate    Co-evaluation              AM-PAC PT "6 Clicks" Mobility   Outcome Measure  Help needed turning from your back to your side while in a flat bed without using bedrails?: None Help needed moving from lying on your back to sitting on the side of a flat bed without using bedrails?: None Help needed moving to and from a bed to a chair (including a wheelchair)?: A Little Help needed standing up from a chair using your arms (e.g., wheelchair or bedside chair)?: A Little Help needed to walk in hospital room?: A Lot Help needed climbing 3-5 steps with a railing? : A Lot 6 Click Score: 18    End of Session   Activity Tolerance: Patient tolerated treatment well;Patient limited by fatigue Patient left: with call bell/phone within reach;in chair;with chair alarm set Nurse Communication:  Mobility status PT Visit Diagnosis: Unsteadiness on feet (R26.81);Other abnormalities of gait and mobility (R26.89);Muscle weakness (generalized) (M62.81)     Time: 2694-8546 PT Time Calculation (min) (ACUTE ONLY): 20 min  Charges:  $Therapeutic Exercise: 8-22 mins $Therapeutic Activity: 8-22 mins                     1:58 PM, 03/08/22 Ocie Bob, MPT Physical Therapist with [facility] [phone] office [phone] mobile phone

## 2022-03-08 NOTE — Progress Notes (Signed)
Pharmacy Antibiotic Note  Garrett [name] is a 36 y.o. male admitted on 03/03/2022 with recent dx of Pseudomonas UTI.  Pharmacy has been consulted for Merrem dosing. Patient with ongoing nausea and thought levaquin might be contributing. Per micro lab, cultures show additional resistance to cefepime and pip/tazo. Discussed with MD and will switch to Merrem. Last dose of levaquin 12/2 at 10pm  Plan: Continue Merrem 1gm q 8h   Height: 5\' 8"  (172.7 cm) Weight: 49 kg (108 lb 0.4 oz) IBW/kg (Calculated) : 68.4  Temp (24hrs), Avg:98 F (36.7 C), Min:98 F (36.7 C), Max:98.1 F (36.7 C)  Recent Labs  Lab 03/04/22 0413 03/05/22 0600 03/06/22 0532 03/07/22 0545 03/08/22 0459 03/08/22 0719  WBC 6.1 5.1 4.7 5.1  --  4.4  CREATININE 0.60* 0.62 0.57* 0.68 0.78  --      Estimated Creatinine Clearance: 86.8 mL/min (by C-G formula based on SCr of 0.78 mg/dL).    No Known Allergies  Antimicrobials this admission: Merrem 12/3 >> Fluconazole 12/4 >> 12/1 Levaquin > 12/3 On Cipro pta  Microbiology results: 11/25 UCx: Peudomonas and Staph   Thank you for allowing pharmacy to be a part of this patient's care.  12/25 03/08/2022 1:23 PM

## 2022-03-08 NOTE — Progress Notes (Signed)
PROGRESS NOTE    Garrett [name]  OFB:[id number] DOB: July 06, 1985 DOA: 03/03/2022 PCP: Pcp, No   Brief Narrative:   36 y.o. 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  On presentation, chest x-ray was negative for acute abnormality.  He was started on IV Levaquin.  Because of persistent nausea and vomiting, he was switched to IV meropenem.  Assessment & Plan:   UTI -Recent diagnosis of Pseudomonas UTI as an outpatient for which she was placed on oral ciprofloxacin which led to nausea. -Initially started on IV Levaquin but because of persistent nausea and vomiting, antibiotics switched to IV meropenem.  Today is only day 4 of meropenem urine culture on admission has been negative so far. -Continue to maintain adequate hydration.  Nausea -Initially thought to be medication related: Currently on round-the-clock Zofran and as needed Reglan. -Patient also empirically started on IV Diflucan on 03/06/2022 with concerns for oropharyngeal candidiasis. -There might be a behavior component as well as patient is mostly holding his saliva in his mouth and spits it on the floor as per nursing staff -Continue the use of Diflucan and nystatin. -Nursing staff reporting improvement in the spitting/vomiting.  Urinary retention -Patient had recent urinary retention requiring Foley catheter placement which was removed as an outpatient by urology.  He had Foley catheter placement again which was removed in the ED on presentation.  Continue Flomax.   -Continue to have issues with urinary retention requiring in and out catheterization few times and subsequently Foley catheter was placed on  03/04/2022.   -Outpatient follow-up with urology.  Elevated LFTs History of chronic hep B -Patient has chronically elevated LFTs.  Right upper quadrant ultrasound showed mild gallbladder distention with biliary sludge but no signs of cholecystitis.   -LFTs slightly worsened today: Will repeat a.m. LFTs since the patient is currently now on Diflucan as well.   -Outpatient follow-up with ID  Hypokalemia -Improved. -Continue to follow electrolytes trend.  Hyponatremia/hypokalemia -Improved.  Still on IV fluids with supplemental potassium.  -Continue to follow electrolytes trend.  Thrombocytopenia -Questionable cause.  Resolved. -Continue to follow platelet count trend.  Diabetes mellitus type 2 with hypoglycemia -Blood sugars currently stable.  DC'd long-acting insulin due to episodes of hypoglycemia.   -Continue the use of D5 drip and follow CBGs fluctuation.Marland Kitchen  Dysphagia -Resume likely associated with oral thrush.   -Diet as per SLP recommendations.  History of psychosis and depression -Continue benztropine, escitalopram, gabapentin, paliperidone, lorazepam if able to swallow.   -Appreciate consultation by psychiatry/TTS; currently recommending inpatient psych once medically stable. -Flat affect appreciated on exam.  No suicidal ideation.  DVT prophylaxis: Lovenox Code Status: Full Family Communication: None at bedside Disposition Plan: Status is: Inpatient Remains inpatient appropriate because: Of severity of illness   Consultants: Consult psychiatry Procedures: None  Antimicrobials:  Anti-infectives (From admission, onward)    Start     Dose/Rate Route Frequency Ordered Stop   03/06/22 1530  fluconazole (DIFLUCAN) IVPB 100 mg        100 mg 50 mL/hr over 60 Minutes Intravenous Every 24 hours 03/06/22 1414     03/05/22 2200  meropenem (MERREM) 1 g in sodium chloride 0.9 % 100 mL IVPB        1 g 200  mL/hr over 30 Minutes Intravenous Every 8 hours 03/05/22 0925      03/03/22 1700  levofloxacin (LEVAQUIN) IVPB 750 mg  Status:  Discontinued        750 mg 100 mL/hr over 90 Minutes Intravenous Every 24 hours 03/03/22 1652 03/05/22 0815       Subjective: Feeling better; no vomiting.  Patient is afebrile.  Still having some difficulties tolerating by mouth but much improved.  Patient is weak and deconditioned.    Objective: Vitals:   03/07/22 1728 03/07/22 2036 03/08/22 0514 03/08/22 1200  BP: 108/78 100/76 95/67 (!) 82/59  Pulse: 73 76 79   Resp: 16 18 19    Temp: 98.1 F (36.7 C) 98 F (36.7 C) 98 F (36.7 C)   TempSrc:      SpO2: 100% 100% 100%   Weight:      Height:        Intake/Output Summary (Last 24 hours) at 03/08/2022 1927 Last data filed at 03/08/2022 1805 Gross per 24 hour  Intake 2956.21 ml  Output 1100 ml  Net 1856.21 ml   Filed Weights   03/03/22 1540  Weight: 49 kg    Examination: General exam: Alert, awake, oriented x 3; following commands and feeling better today.  No chest pain, no active vomiting seen.  Still was present to be nauseated. Respiratory system: Good Cardiovascular system:RRR. No rubs or gallops Gastrointestinal system: Abdomen is nondistended, soft and nontender. No organomegaly or masses felt. Normal bowel sounds heard. Central nervous system: Alert and oriented. No focal neurological deficits. Extremities: No cyanosis or clubbing. Skin: No petechiae; positive resolving oral thrush appreciated on examination. Psychiatry: Judgement and insight appear normal.  Flat affect without signs of agitation.  Data Reviewed: I have personally reviewed following labs and imaging studies  CBC: Recent Labs  Lab 03/04/22 0413 03/05/22 0600 03/06/22 0532 03/07/22 0545 03/08/22 0719  WBC 6.1 5.1 4.7 5.1 4.4  NEUTROABS  --  3.1 1.8 1.8 1.6*  HGB 10.8* 11.2* 11.5* 11.5* 11.4*  HCT 33.1* 33.7* 33.7* 34.2* 34.2*  MCV 95.7 91.3 89.9 92.7 93.4  PLT 122* 127* 133* 150 [id number]*   Basic Metabolic Panel: Recent Labs   Lab 03/04/22 0413 03/05/22 0600 03/06/22 0532 03/07/22 0545 03/08/22 0459  NA 142 132* 133* 136 136  K 3.1* 2.6* 3.4* 3.9 4.0  CL 109 98 102 105 106  CO2 26 29 28 25 28   GLUCOSE 73 131* 92 132* 83  BUN 12 6 <5* <5* 5*  CREATININE 0.60* 0.62 0.57* 0.68 0.78  CALCIUM 7.5* 7.5* 7.9* 7.8* 7.9*  MG  --  1.8 1.8 2.1 2.0   GFR: Estimated Creatinine Clearance: 86.8 mL/min (by C-G formula based on SCr of 0.78 mg/dL).  Liver Function Tests: Recent Labs  Lab 03/04/22 0413 03/05/22 0600 03/06/22 0532 03/07/22 0545 03/08/22 0459  AST 101* 100* 111* 153* 158*  ALT 109* 101* 105* 136* 140*  ALKPHOS 40 39 42 46 43  BILITOT 1.4* 2.0* 1.7* 1.8* 1.1  PROT 6.8 6.7 6.9 7.0 6.5  ALBUMIN 2.6* 2.5* 2.7* 2.7* 2.5*   No results for input(s): "LIPASE", "AMYLASE" in the last 168 hours.  Recent Labs  Lab 03/06/22 0532  AMMONIA 31   CBG: Recent Labs  Lab 03/07/22 1726 03/07/22 2039 03/08/22 0710 03/08/22 1107 03/08/22 1625  GLUCAP 89 109* 114* 103* 105*   Thyroid Function Tests: Recent Labs    03/06/22 0532  TSH 3.794   Anemia Panel: Recent Labs  03/06/22 0532  [id number] 2,330*   Sepsis Labs: No results for input(s): "PROCALCITON", "LATICACIDVEN" in the last 168 hours.   Recent Results (from the past 240 hour(s))  Urine Culture     Status: None   Collection Time: 03/04/22  1:24 PM   Specimen: Urine, Clean Catch  Result Value Ref Range Status   Specimen Description   Final    URINE, CLEAN CATCH Performed at [facility], [address] [place], [place] [address]    Special Requests   Final    NONE Performed at [facility], [address] [place], [place] [address]    Culture   Final    NO GROWTH Performed at [facility], [place] [address], [place], [place] [address]    Report Status 03/05/2022 FINAL  Final     Radiology Studies: No results found.   Scheduled Meds:  benztropine  0.5 mg Oral BID   Chlorhexidine Gluconate Cloth  6 each Topical  Daily   enoxaparin (LOVENOX) injection  40 mg Subcutaneous Q24H   escitalopram  10 mg Oral Daily   fludrocortisone  0.05 mg Oral BID   gabapentin  100 mg Oral BID   LORazepam  0.5 mg Oral QHS   paliperidone  3 mg Oral QHS   tamsulosin  0.4 mg Oral Daily   Continuous Infusions:  dextrose 5 % and 0.9 % NaCl with KCl 40 mEq/L 75 mL/hr at 03/08/22 0320   fluconazole (DIFLUCAN) IV 100 mg (03/08/22 1553)   meropenem (MERREM) IV 1 g (03/08/22 1401)    Barton Dubois, MD [facility] 03/08/2022, 7:27 PM

## 2022-03-08 NOTE — Progress Notes (Signed)
CSW requested pt be reviewed at [facility] per [facility] Fransico Michael, RN. CSW will assist and follow with placement.   Maryjean Ka, MSW, [facility] 03/08/2022 12:34 AM

## 2022-03-08 NOTE — Progress Notes (Signed)
Speech Language Pathology Treatment: Dysphagia  Patient Details Name: Garrett [name] MRN: [id number] DOB: 11-05-1985 Today's Date: 03/08/2022 Time: 2595-6387 SLP Time Calculation (min) (ACUTE ONLY): 24 min  Assessment / Plan / Recommendation Clinical Impression  Pt seen for ongoing dysphagia intervention. He has received IV diflucan and tongue now looks normal. Pt is not holding saliva and is able to self feed regular textures and thin liquids without signs or symptoms of aspiration and no reports of globus. He only has a couple of teeth and self feed a peanut butter cracker by placing the entire cracker in his mouth. He exhibited impaired mastication and prolonged oral prep and resulting oral residuals. Pt benefited from liquid wash. Recommend D3/mech soft due to poor dentition and impulsivity noted with intake with standard aspiration and reflux precautions. SLP will sign off. Above to RN and MD.     HPI HPI: 36 y.o. 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  On presentation, chest x-ray was negative for acute abnormality.  He was started on IV Levaquin. Nursing staff reports that patient continues to refuse anything oral and continues to hold his saliva in his mouth and spits it in the floor. No fever, seizures reported. BSE ordered.      SLP Plan  All goals met;Discharge SLP treatment due to (comment)      Recommendations for follow up therapy are one component of a multi-disciplinary discharge planning process, led by the attending physician.  Recommendations may be updated based on patient status, additional functional criteria and insurance authorization.     Recommendations  Diet recommendations: Dysphagia 3 (mechanical soft);Thin liquid Liquids provided via: Cup;Straw Medication Administration: Whole meds with liquid Supervision: Patient able to self feed;Intermittent supervision to cue for compensatory strategies Compensations: Slow rate;Small sips/bites                Oral Care Recommendations: Oral care BID Follow Up Recommendations: Follow physician's recommendations for discharge plan and follow up therapies Assistance recommended at discharge: Intermittent Supervision/Assistance SLP Visit Diagnosis: Dysphagia, unspecified (R13.10) Plan: All goals met;Discharge SLP treatment due to (comment)         Thank you,  Genene Churn, [place]   [place]  03/08/2022, 11:57 AM

## 2022-03-08 NOTE — Progress Notes (Signed)
Cares completed with interpreter services this shift.  Pt reports mild nausea, agreeable to use of IV zofran.  Pt requested snacks later in shift, reporting nausea managed.  Continues to report excessive saliva production, states this causes difficulty swallowing.  Denies pain.  Foley patent with amber urine, pt declined foley cares during assessment.  Denies pain at cath insertion site.  Pt in bed and awake most of noc, denies any needs.  Compliant with HS meds.

## 2022-03-08 NOTE — Consult Note (Signed)
Telepsych Consultation   Reason for Consult: Patient refusing patient on multiple psych meds, refusing oral meds and treatments.  Resolved  Referring Physician: Dr. Loann Quill  Location of Patient: [facility]  Location of Provider: Behavioral Health TTS Department  Patient Identification: Garrett [name]  MRN:  [id number]  Principal Diagnosis: UTI (urinary tract infection)  Diagnosis:  Active Problems:   MDD (major depressive disorder), recurrent, severe, with psychosis (HCC)   Controlled type 2 diabetes mellitus without complication, with long-term current use of insulin (HCC)   Elevated LFTs   Chronic Hepatitis B      Total Time spent with patient: 30 minutes  Subjective:   Garrett [name] is a 36 y.o. male patient admitted with UTI, and history significant for depression with psychosis and on antipsychotic and antidepressants medications.   Assessment: On examination today, patient is seen and examined via telepsych using a [employer] practitioner # [id number].  Patient is more alert and participating in the exam, responding to questions appropriately. Oriented x 3, speech much improved in volume and pattern.  Maintain good eye contact with the provider.  Patient reports decreased abdominal pain and nausea resolving.  From [facility] review and nurses report, patient is taking prescribed psychotropic medications well and able to consume some nourishment without nausea or vomiting.  He does not appear to be responding to internal or external stimuli.  No psychotic symptoms observed.  Attempted to get out of bed and walk without assistance, patient was made aware to call for help prior to getting out of bed.  Bed alarm placed in bed to alert staff.  Abnormal labs monitored by the Hospitalist.  Patient denies SI, HI, SIB or suicide attempt, AVH, however added I used to hear voices about 1 year ago, when I was very sick in the hospital but not recently.  Denies symptoms of depression, mania, PTSD.  Reports sleeping up  to 8 hours last night.  Reports appetite is improving.  Reported being safe at the group home, with no access to firearms. Denies drug use, alcohol use, tobacco use or marijuana use.  Reports seeing a therapist/psychiatrist only when hospitalized at behavioral health.  Denies family history of mental illness.  Supportive therapy provided about ongoing stressors.   HPI:   Garrett [name] is a 36 y.o. 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  History is obtained with use of interpreter.  Patient is a ward of the state and lives in a group home.  He was sent to the hospital due to nausea and vomiting.  He was diagnosed with a UTI about 5 days ago and was prescribed an antibiotic.  This antibiotic was changed to ciprofloxacin because the urine culture grew out Pseudomonas.  Unfortunately, every time he takes his antibiotic, he is unable to keep anything down and vomits.  His appetite has been poor.  He reports no fevers or chills.  He is come to the hospital for evaluation.    Past Psychiatric History:  History of major depressive disorder recurrent severe with psychosis, history of major depressive disorder recurrent, moderate with mood congruent psychotic features, major depressive disorder recurrent severe without psychotic features.  Previous admission to the [facility] for treatment of major depressive disorder, severe, with psychotic features and decreased self care in 2010, 2019, 2021, and 2023.  Disposition: Based on my evaluation of patient with complicated medical history of UTI, nausea, urinary retention,  elevated LFTs, hypokalemia improved, hyponatremia improved, thrombocytopenia resolved, diabetes mellitus type 2, blood sugars currently stable, dysphagia with questionable cause, he appears to be minimizing his symptoms, possibly due to language barrier.  He  meets the criteria for inpatient psychiatric admission when medically cleared.  [facility] treatment team made aware of patient disposition.   Risk to Self:  No  Risk to Others:  No Prior Inpatient Therapy:  Yes Prior Outpatient Therapy:  Yes  Past Medical History:  Past Medical History:  Diagnosis Date   Acute metabolic encephalopathy 03/09/2020   Altered mental status    Anemia 05/05/2021   Chronic Hepatitis B 05/12/2021   Chronic hepatitis B without hepatic coma (HCC) 01/14/2022   Depression    Diabetes mellitus (HCC)    Elevated LFTs 03/09/2020   Hypoalbuminemia 01/24/2022   Hyponatremia 01/24/2022   Hypotension 01/26/2022   Major depressive disorder, recurrent episode, moderate with mood-congruent psychotic features (HCC)    Major depressive disorder, recurrent severe without psychotic features (HCC) 06/20/2017   Physical deconditioning    Postoperative intra-abdominal abscess 01/20/2022   Severe malnutrition (HCC) 01/18/2022   Severe malnutrition (HCC) 01/18/2022    Past Surgical History:  Procedure Laterality Date   APPENDECTOMY     EXPLORATORY LAPAROTOMY     Family History: History reviewed. No pertinent family history.  Family Psychiatric  History: None indicated  Social History:  Social History   Substance and Sexual Activity  Alcohol Use Not Currently   Comment: occassionally     Social History   Substance and Sexual Activity  Drug Use Never    Social History   Socioeconomic History   Marital status: Single    Spouse name: Not on file   Number of children: Not on file   Years of education: Not on file   Highest education level: Not on file  Occupational History   Not on file  Tobacco Use   Smoking status: Former    Types: Cigarettes   Smokeless tobacco: Never  Substance and Sexual Activity   Alcohol use: Not Currently    Comment: occassionally   Drug use: Never   Sexual activity: Not on file  Other Topics Concern   Not on file  Social  History Narrative   Not on file   Social Determinants of Health   Financial Resource Strain: Not on file  Food Insecurity: Not on file  Transportation Needs: Not on file  Physical Activity: Not on file  Stress: Not on file  Social Connections: Not on file   Additional Social History:    Allergies:  No Known Allergies  Labs:  Results for orders placed or performed during the hospital encounter of 03/03/22 (from the past 48 hour(s))  Glucose, capillary     Status: Abnormal   Collection Time: 03/06/22  5:45 PM  Result Value Ref Range   Glucose-Capillary 104 (H) 70 - 99 mg/dL    Comment: Glucose reference range applies only to samples taken after fasting for at least 8 hours.  Glucose, capillary     Status: Abnormal   Collection Time: 03/06/22  8:51 PM  Result Value Ref Range   Glucose-Capillary 102 (H) 70 - 99 mg/dL    Comment: Glucose reference range applies only to samples taken after fasting for at least 8 hours.  CBC with Differential/Platelet     Status: Abnormal   Collection Time: 03/07/22  5:45 AM  Result Value Ref Range   WBC 5.1 4.0 - 10.5 K/uL  RBC 3.69 (L) 4.22 - 5.81 MIL/uL   Hemoglobin 11.5 (L) 13.0 - 17.0 g/dL   HCT 84.134.2 (L) 32.439.0 - 40.152.0 %   MCV 92.7 80.0 - 100.0 fL   MCH 31.2 26.0 - 34.0 pg   MCHC 33.6 30.0 - 36.0 g/dL   RDW 02.713.5 25.311.5 - 66.415.5 %   Platelets 150 150 - 400 K/uL   nRBC 0.0 0.0 - 0.2 %   Neutrophils Relative % 35 %   Neutro Abs 1.8 1.7 - 7.7 K/uL   Lymphocytes Relative 47 %   Lymphs Abs 2.4 0.7 - 4.0 K/uL   Monocytes Relative 13 %   Monocytes Absolute 0.7 0.1 - 1.0 K/uL   Eosinophils Relative 4 %   Eosinophils Absolute 0.2 0.0 - 0.5 K/uL   Basophils Relative 1 %   Basophils Absolute 0.0 0.0 - 0.1 K/uL   Immature Granulocytes 0 %   Abs Immature Granulocytes 0.01 0.00 - 0.07 K/uL    Comment: Performed at [facility], [address] [place], [place] [address]  Comprehensive metabolic panel     Status: Abnormal   Collection Time: 03/07/22   5:45 AM  Result Value Ref Range   Sodium 136 135 - 145 mmol/L   Potassium 3.9 3.5 - 5.1 mmol/L   Chloride 105 98 - 111 mmol/L   CO2 25 22 - 32 mmol/L   Glucose, Bld 132 (H) 70 - 99 mg/dL    Comment: Glucose reference range applies only to samples taken after fasting for at least 8 hours.   BUN <5 (L) 6 - 20 mg/dL   Creatinine, Ser 4.250.68 0.61 - 1.24 mg/dL   Calcium 7.8 (L) 8.9 - 10.3 mg/dL   Total Protein 7.0 6.5 - 8.1 g/dL   Albumin 2.7 (L) 3.5 - 5.0 g/dL   AST 956153 (H) 15 - 41 U/L   ALT 136 (H) 0 - 44 U/L   Alkaline Phosphatase 46 38 - 126 U/L   Total Bilirubin 1.8 (H) 0.3 - 1.2 mg/dL   GFR, Estimated >38>60 >75>60 mL/min    Comment: (NOTE) Calculated using the CKD-EPI Creatinine Equation (2021)    Anion gap 6 5 - 15    Comment: Performed at [facility], [address] [place], [place] [address]  Magnesium     Status: None   Collection Time: 03/07/22  5:45 AM  Result Value Ref Range   Magnesium 2.1 1.7 - 2.4 mg/dL    Comment: Performed at [facility], [address] [place], [place] [address]  Glucose, capillary     Status: Abnormal   Collection Time: 03/07/22  7:39 AM  Result Value Ref Range   Glucose-Capillary 125 (H) 70 - 99 mg/dL    Comment: Glucose reference range applies only to samples taken after fasting for at least 8 hours.  Glucose, capillary     Status: Abnormal   Collection Time: 03/07/22 11:21 AM  Result Value Ref Range   Glucose-Capillary 127 (H) 70 - 99 mg/dL    Comment: Glucose reference range applies only to samples taken after fasting for at least 8 hours.  Glucose, capillary     Status: None   Collection Time: 03/07/22  5:26 PM  Result Value Ref Range   Glucose-Capillary 89 70 - 99 mg/dL    Comment: Glucose reference range applies only to samples taken after fasting for at least 8 hours.  Glucose, capillary     Status: Abnormal   Collection Time: 03/07/22  8:39 PM  Result Value Ref Range   Glucose-Capillary 109 (H) 70 - 99 mg/dL    Comment: Glucose  reference range applies only to samples taken after fasting for at least 8 hours.  Comprehensive metabolic panel     Status: Abnormal   Collection Time: 03/08/22  4:59 AM  Result Value Ref Range   Sodium 136 135 - 145 mmol/L   Potassium 4.0 3.5 - 5.1 mmol/L   Chloride 106 98 - 111 mmol/L   CO2 28 22 - 32 mmol/L   Glucose, Bld 83 70 - 99 mg/dL    Comment: Glucose reference range applies only to samples taken after fasting for at least 8 hours.   BUN 5 (L) 6 - 20 mg/dL   Creatinine, Ser 7.42 0.61 - 1.24 mg/dL   Calcium 7.9 (L) 8.9 - 10.3 mg/dL   Total Protein 6.5 6.5 - 8.1 g/dL   Albumin 2.5 (L) 3.5 - 5.0 g/dL   AST 595 (H) 15 - 41 U/L   ALT 140 (H) 0 - 44 U/L   Alkaline Phosphatase 43 38 - 126 U/L   Total Bilirubin 1.1 0.3 - 1.2 mg/dL   GFR, Estimated >63 >87 mL/min    Comment: (NOTE) Calculated using the CKD-EPI Creatinine Equation (2021)    Anion gap 2 (L) 5 - 15    Comment: Electrolytes repeated to confirm. Performed at [facility], [address], [place], [place] [address]   Magnesium     Status: None   Collection Time: 03/08/22  4:59 AM  Result Value Ref Range   Magnesium 2.0 1.7 - 2.4 mg/dL    Comment: Performed at [facility], [address] [place], [place] [address]  Glucose, capillary     Status: Abnormal   Collection Time: 03/08/22  7:10 AM  Result Value Ref Range   Glucose-Capillary 114 (H) 70 - 99 mg/dL    Comment: Glucose reference range applies only to samples taken after fasting for at least 8 hours.  CBC with Differential/Platelet     Status: Abnormal   Collection Time: 03/08/22  7:19 AM  Result Value Ref Range   WBC 4.4 4.0 - 10.5 K/uL   RBC 3.66 (L) 4.22 - 5.81 MIL/uL   Hemoglobin 11.4 (L) 13.0 - 17.0 g/dL   HCT 84.1 (L) 66.0 - 63.0 %   MCV 93.4 80.0 - 100.0 fL   MCH 31.1 26.0 - 34.0 pg   MCHC 33.3 30.0 - 36.0 g/dL   RDW 16.0 10.9 - 32.3 %   Platelets 124 (L) 150 - 400 K/uL   nRBC 0.0 0.0 - 0.2 %   Neutrophils Relative % 37 %   Neutro Abs 1.6 (L)  1.7 - 7.7 K/uL   Lymphocytes Relative 47 %   Lymphs Abs 2.1 0.7 - 4.0 K/uL   Monocytes Relative 11 %   Monocytes Absolute 0.5 0.1 - 1.0 K/uL   Eosinophils Relative 4 %   Eosinophils Absolute 0.2 0.0 - 0.5 K/uL   Basophils Relative 1 %   Basophils Absolute 0.0 0.0 - 0.1 K/uL   Immature Granulocytes 0 %   Abs Immature Granulocytes 0.01 0.00 - 0.07 K/uL    Comment: Performed at [facility], [address], [place], [place] [address]  Glucose, capillary     Status: Abnormal   Collection Time: 03/08/22 11:07 AM  Result Value Ref Range   Glucose-Capillary 103 (H) 70 - 99 mg/dL    Comment: Glucose reference range applies only to samples taken after fasting  for at least 8 hours.    Medications:  Current Facility-Administered Medications  Medication Dose Route Frequency Provider Last Rate Last Admin   benztropine (COGENTIN) tablet 0.5 mg  0.5 mg Oral BID Stinson, Jacob J, DO   0.5 mg at 03/08/22 1610   Chlorhexidine Gluconate Cloth 2 % PADS 6 each  6 each Topical Daily Glade Lloyd, MD   6 each at 03/08/22 0915   dextrose 5 % and 0.9 % NaCl with KCl 40 mEq/L infusion   Intravenous Continuous Glade Lloyd, MD 75 mL/hr at 03/08/22 0320 Infusion Verify at 03/08/22 0320   enoxaparin (LOVENOX) injection 40 mg  40 mg Subcutaneous Q24H Levie Heritage, DO   40 mg at 03/07/22 0944   escitalopram (LEXAPRO) tablet 10 mg  10 mg Oral Daily Levie Heritage, DO   10 mg at 03/08/22 9604   fluconazole (DIFLUCAN) IVPB 100 mg  100 mg Intravenous Q24H Glade Lloyd, MD   Stopped at 03/07/22 1650   gabapentin (NEURONTIN) capsule 100 mg  100 mg Oral BID Levie Heritage, DO   100 mg at 03/08/22 5409   LORazepam (ATIVAN) tablet 0.5 mg  0.5 mg Oral QHS Levie Heritage, DO   0.5 mg at 03/07/22 2128   meropenem (MERREM) 1 g in sodium chloride 0.9 % 100 mL IVPB  1 g Intravenous Q8H Madueme, Elvira C, RPH 200 mL/hr at 03/08/22 0529 1 g at 03/08/22 0529   metoCLOPramide (REGLAN) injection 10 mg  10 mg Intravenous  Q6H PRN Glade Lloyd, MD   10 mg at 03/06/22 2154   ondansetron (ZOFRAN) injection 4 mg  4 mg Intravenous Q6H PRN Glade Lloyd, MD   4 mg at 03/07/22 2125   paliperidone (INVEGA) 24 hr tablet 3 mg  3 mg Oral QHS Levie Heritage, DO   3 mg at 03/07/22 2128   tamsulosin (FLOMAX) capsule 0.4 mg  0.4 mg Oral Daily Levie Heritage, DO   0.4 mg at 03/08/22 0911   Musculoskeletal: Strength & Muscle Tone: within normal limits Gait & Station: normal Patient leans: N/A  Psychiatric Specialty Exam:  Presentation  General Appearance:  Appropriate for Environment; Casual; Fairly Groomed  Eye Contact: Good  Speech: Clear and Coherent  Speech Volume: Normal  Handedness: Right  Mood and Affect  Mood: Euthymic  Affect: Congruent  Thought Process  Thought Processes: Coherent  Descriptions of Associations:Intact  Orientation:Full (Time, Place and Person)  Thought Content:Logical  History of Schizophrenia/Schizoaffective disorder:No  Duration of Psychotic Symptoms:N/A  Hallucinations:Hallucinations: None (Patient states he experienced hallucination 1 year ago, when he was real sick at the hospital.)  Ideas of Reference:None  Suicidal Thoughts:Suicidal Thoughts: No  Homicidal Thoughts:Homicidal Thoughts: No  Sensorium  Memory: Immediate Fair; Recent Fair; Remote Fair  Judgment: Fair (Attempting to get out of bed with assist.  Requires assistant.  Bed alarm in place.)  Insight: Fair  Executive Functions  Concentration: Good  Attention Span: Good  Recall: [employer] of Knowledge: Fair  Language: Good  Psychomotor Activity  Psychomotor Activity: Psychomotor Activity: Increased  Assets  Assets: Communication Skills; Desire for Improvement; Physical Health  Sleep  Sleep: Sleep: Good Number of Hours of Sleep: 8 (Patient states he sleeps well with sleep medication.)  Physical Exam: Physical Exam Vitals and nursing note reviewed.   Review  of Systems  Constitutional: Negative.   HENT: Negative.    Eyes: Negative.   Respiratory: Negative.    Cardiovascular: Negative.  Blood pressure 82/59, pulse 79.  Nursing staff to recheck vital signs.  Gastrointestinal:  Positive for abdominal pain (Rates abdominal pain 5/10 with 10 being the worst.) and nausea (Patient reports nausea is subsiding.).  Genitourinary: Negative.   Musculoskeletal: Negative.   Skin: Negative.   Neurological: Negative.   Endo/Heme/Allergies: Negative.   Psychiatric/Behavioral:  The patient is nervous/anxious.    Blood pressure (!) 82/59, pulse 79, temperature 98 F (36.7 C), resp. rate 19, height 5\' 8"  (1.727 m), weight 49 kg, SpO2 100 %. Body mass index is 16.43 kg/m.  Treatment Plan Summary: Daily contact with patient to assess and evaluate symptoms and progress in treatment and Medication management  Plan: Psychosis: --Continue Invega 3 mg p.o. nightly.   --Continue Cogentin 0.5 mg p.o. twice daily.    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 daily.   Anxiety: -- Continue lorazepam (Ativan) 0.5 mg p.o. nightly.   -- Continue gabapentin 100 mg p.o. twice daily.     Nausea: Currently on round-the-clock Zofran and as needed Reglan. -Patient also empirically started on IV Diflucan on 03/06/2022 with concerns for oropharyngeal candidiasis.  Diflucan discontinued 03/08/2022. -There might be a behavior component as well as patient is mostly holding his saliva in his mouth and spits it on the floor as per nursing staff.  This condition is improving.   Disposition: Recommends psychiatric inpatient admission when medically  cleared.  This service was provided via telemedicine using a 2-way, interactive audio and video technology.  Names of all persons participating in this telemedicine service and their role in this encounter. Name: Lyndel Gelin Role: Patient  Name: 14/09/2021  Role: NP provider  Name: Dr. Alan Mulder Role: Medical Director  Name: Dr. Lucianne Muss Role: Hanley Ben, ED physician    Jeani Hawking, FNP 03/08/2022 1:42 PM

## 2022-03-08 NOTE — Progress Notes (Signed)
Patient asked to remove Foley , notified Dr. Gwenlyn Perking. Patient is now being monitored via telesitter due to noncompliance with safety precautions .

## 2022-03-09 DIAGNOSIS — E119 Type 2 diabetes mellitus without complications: Secondary | ICD-10-CM | POA: Diagnosis not present

## 2022-03-09 DIAGNOSIS — N3001 Acute cystitis with hematuria: Secondary | ICD-10-CM | POA: Diagnosis not present

## 2022-03-09 DIAGNOSIS — N3000 Acute cystitis without hematuria: Secondary | ICD-10-CM | POA: Diagnosis not present

## 2022-03-09 DIAGNOSIS — R7989 Other specified abnormal findings of blood chemistry: Secondary | ICD-10-CM | POA: Diagnosis not present

## 2022-03-09 LAB — BASIC METABOLIC PANEL
Anion gap: 2 — ABNORMAL LOW (ref 5–15)
BUN: 7 mg/dL (ref 6–20)
CO2: 22 mmol/L (ref 22–32)
Calcium: 8 mg/dL — ABNORMAL LOW (ref 8.9–10.3)
Chloride: 109 mmol/L (ref 98–111)
Creatinine, Ser: 0.67 mg/dL (ref 0.61–1.24)
GFR, Estimated: >60 mL/min (ref >60)
Glucose, Bld: 171 mg/dL — ABNORMAL HIGH (ref 70–99)
Potassium: 4.5 mmol/L (ref 3.5–5.1)
Sodium: 133 mmol/L — ABNORMAL LOW (ref 135–145)

## 2022-03-09 LAB — GLUCOSE, CAPILLARY
Glucose-Capillary: 160 mg/dL — ABNORMAL HIGH (ref 70–99)
Glucose-Capillary: 142 mg/dL — ABNORMAL HIGH (ref 70–99)
Glucose-Capillary: 127 mg/dL — ABNORMAL HIGH (ref 70–99)
Glucose-Capillary: 135 mg/dL — ABNORMAL HIGH (ref 70–99)

## 2022-03-09 MED ORDER — FOSFOMYCIN TROMETHAMINE 3 G PO PACK
3.0000 g | PACK | Freq: Once | ORAL | Status: AC
  Administered 2022-03-10: 3 g via ORAL
  Filled 2022-03-09: qty 3

## 2022-03-09 MED ORDER — FLUDROCORTISONE ACETATE 0.1 MG PO TABS
0.1000 mg | ORAL_TABLET | Freq: Two times a day (BID) | ORAL | Status: DC
  Administered 2022-03-09 – 2022-03-10 (×3): 0.1 mg via ORAL
  Filled 2022-03-09 (×3): qty 1

## 2022-03-09 MED ORDER — FLUCONAZOLE 100 MG PO TABS
100.0000 mg | ORAL_TABLET | Freq: Every day | ORAL | Status: DC
  Administered 2022-03-09 – 2022-03-10 (×2): 100 mg via ORAL
  Filled 2022-03-09 (×2): qty 1

## 2022-03-09 MED ORDER — SODIUM CHLORIDE 0.9 % IV BOLUS
500.0000 mL | Freq: Once | INTRAVENOUS | Status: AC
  Administered 2022-03-09: 500 mL via INTRAVENOUS

## 2022-03-09 NOTE — Progress Notes (Addendum)
Patient rang out to Diplomatic Services operational officer, This nurse entered room Patient wanted to go to the restroom. Assisted patient to the bathroom when the patient got weak, slowly assisted the patient to the ground. Tele sitter observed event. Patient got up with assistance no complaint of pain, he ambulated to the toilet. After this nurse and Baird Lyons, NT finish changing bed and cleaning up patient. Patient wanted to go back to the bed. While attempting to ambulated the patient went weak again. Assisted him to the floor again, no injury obtained. Charge Nurse Malen Gauze, notified she assisted with getting patient back to bed. Alarm set and floor mat already down. Noted patients last full set of vitals was done at 0514 on 03/08/2022. Vitals obtained at 2108 BP 96/65, pulse 96, o2 99, RR 19, temp 97.5. Dr. Carren Rang notified and bolus given.    0029 vitals take post bolus, BP 94/70, pulse 95, RR 17, o2 99% Room air, temp 97.8. MD notified again Another bolus ordered.   Vitals post second bolus at 0313 BP 92/60 manual, pulse 91, RR 17, o2 100% Room air, temp 97.3. MD notified.   Patient continues to have loose stools. New order for GI Panel this am, have not been able to obtain yet.

## 2022-03-09 NOTE — Consult Note (Signed)
Telepsych Consultation   Reason for Consult: Patient on multiple psych meds. Referring Physician: Dr. Starla Link Location of Patient: [facility]01 Location of Provider: [facility]  Patient Identification: Garrett [name] MRN:  [id number] Principal Diagnosis: UTI (urinary tract infection) Diagnosis:  Active Problems:   MDD (major depressive disorder), recurrent, severe, with psychosis ([place])   Controlled type 2 diabetes mellitus without complication, with long-term current use of insulin (HCC)   Elevated LFTs   Chronic Hepatitis B   Total Time spent with patient: 30 minutes  Subjective:   Garrett [name] is a 36 y.o. male patient admitted with  UTI, and history significant for depression with psychosis and on antipsychotic and antidepressants medications.  .  Assessment: Patient is seen and examined via telepsych using a [employer] executive NAW, with # [id number].  Patient alert and oriented x 4. He presents with depressed mood and congruent affect today.  Reports depressive symptoms to include hopelessness, worthlessness, guilt, fatigue, poor concentration, and anhedonia.  Reports that he had diarrhea x 5 last night and he is weak.  Patient was placed on enteric precaution for UV disinfection for C-Diff.  Able to participate in the exams and appears coherent.  Report abdominal pain of 6/10 with 10 being the worst.  Reports nausea and vomiting is resolving and is able to tolerate psychotropic medications as ordered.  No psychotic symptoms observed during this examination.  Patient does not appear to be responding to internal and external stimuli during the examination.  As Per [facility] patient is taking all his prescribed psychotropic medications without adverse effect.  As per nurses notes, patient is very weak and could not ambulate to the bathroom, he requires 2 assist with ambulation possibly due to diarrhea.  PT continues to work on patient for strengthening as ordered.  Also patient to receive  fosfomycin 3 GM packet for uncomplicated UTI on [id number].  Continues to require medical evaluation at this time.  Abnormal labs monitored and follow-up by the Hospitalist.   Patient denies SI, HI, AVH, SIB or suicide attempt.  He denies access to firearms, drug use, alcohol use, tobacco use, or family history of mental illness.  Denies being followed by a therapist or psychiatrist.  Reports decreased sleep due to having 5 diarrhea at night.  Supportive therapy provided for ongoing stressors.  HPI:   Garrett [name] is a 36 y.o. 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  History is obtained with use of interpreter.  Patient is a ward of the state and lives in a group home.  He was sent to the hospital due to nausea and vomiting.  He was diagnosed with a UTI about 5 days ago and was prescribed an antibiotic.  This antibiotic was changed to ciprofloxacin because the urine culture grew out Pseudomonas.  Unfortunately, every time he takes his antibiotic, he is unable to keep anything down and vomits.  His appetite has been poor.  He reports no fevers or chills.  He is come to the hospital for evaluation.    Disposition: Patient continues to require inpatient psychiatric placement when medically stable.  CSW to pursue adequate inpatient psychiatric placement for patient.  Forestine Na, ED treatment team and Forestine Na, EDP aware of patient disposition.  Past Psychiatric History:  History of major depressive disorder recurrent severe with psychosis, history of major depressive disorder recurrent, moderate with mood congruent psychotic features, major depressive disorder recurrent severe  without psychotic features.  Previous admission to the [facility] for treatment of major depressive disorder, severe, with psychotic features and decreased self care in 2010, 2019, 2021, and  2023.   Risk to Self:  No Risk to Others:  No Prior Inpatient Therapy:  Yes Prior Outpatient Therapy:  Yes  Past Medical History:  Past Medical History:  Diagnosis Date   Acute metabolic encephalopathy 99991111   Altered mental status    Anemia 05/05/2021   Chronic Hepatitis B 05/12/2021   Chronic hepatitis B without hepatic coma ([place]) 01/14/2022   Depression    Diabetes mellitus ([place])    Elevated LFTs 03/09/2020   Hypoalbuminemia 01/24/2022   Hyponatremia 01/24/2022   Hypotension 01/26/2022   Major depressive disorder, recurrent episode, moderate with mood-congruent psychotic features ([place])    Major depressive disorder, recurrent severe without psychotic features ([place]) 06/20/2017   Physical deconditioning    Postoperative intra-abdominal abscess 01/20/2022   Severe malnutrition ([place]) 01/18/2022   Severe malnutrition ([place]) 01/18/2022    Past Surgical History:  Procedure Laterality Date   APPENDECTOMY     EXPLORATORY LAPAROTOMY     Family History: History reviewed. No pertinent family history.  Family Psychiatric  History: None indicated  Social History:  Social History   Substance and Sexual Activity  Alcohol Use Not Currently   Comment: occassionally     Social History   Substance and Sexual Activity  Drug Use Never    Social History   Socioeconomic History   Marital status: Single    Spouse name: Not on file   Number of children: Not on file   Years of education: Not on file   Highest education level: Not on file  Occupational History   Not on file  Tobacco Use   Smoking status: Former    Types: Cigarettes   Smokeless tobacco: Never  Substance and Sexual Activity   Alcohol use: Not Currently    Comment: occassionally   Drug use: Never   Sexual activity: Not on file  Other Topics Concern   Not on file  Social History Narrative   Not on file   Social Determinants of Health   Financial Resource Strain: Not on file  Food Insecurity: Not on  file  Transportation Needs: Not on file  Physical Activity: Not on file  Stress: Not on file  Social Connections: Not on file   Additional Social History:    Allergies:  No Known Allergies  Labs:  Results for orders placed or performed during the hospital encounter of 03/03/22 (from the past 48 hour(s))  Glucose, capillary     Status: Abnormal   Collection Time: 03/07/22 11:21 AM  Result Value Ref Range   Glucose-Capillary 127 (H) 70 - 99 mg/dL    Comment: Glucose reference range applies only to samples taken after fasting for at least 8 hours.  Glucose, capillary     Status: None   Collection Time: 03/07/22  5:26 PM  Result Value Ref Range   Glucose-Capillary 89 70 - 99 mg/dL    Comment: Glucose reference range applies only to samples taken after fasting for at least 8 hours.  Glucose, capillary     Status: Abnormal   Collection Time: 03/07/22  8:39 PM  Result Value Ref Range   Glucose-Capillary 109 (H) 70 - 99 mg/dL    Comment: Glucose reference range applies only to samples taken after fasting for at least 8 hours.  Comprehensive metabolic panel  Status: Abnormal   Collection Time: 03/08/22  4:59 AM  Result Value Ref Range   Sodium 136 135 - 145 mmol/L   Potassium 4.0 3.5 - 5.1 mmol/L   Chloride 106 98 - 111 mmol/L   CO2 28 22 - 32 mmol/L   Glucose, Bld 83 70 - 99 mg/dL    Comment: Glucose reference range applies only to samples taken after fasting for at least 8 hours.   BUN 5 (L) 6 - 20 mg/dL   Creatinine, Ser 0.78 0.61 - 1.24 mg/dL   Calcium 7.9 (L) 8.9 - 10.3 mg/dL   Total Protein 6.5 6.5 - 8.1 g/dL   Albumin 2.5 (L) 3.5 - 5.0 g/dL   AST 158 (H) 15 - 41 U/L   ALT 140 (H) 0 - 44 U/L   Alkaline Phosphatase 43 38 - 126 U/L   Total Bilirubin 1.1 0.3 - 1.2 mg/dL   GFR, Estimated >60 >60 mL/min    Comment: (NOTE) Calculated using the CKD-EPI Creatinine Equation (2021)    Anion gap 2 (L) 5 - 15    Comment: Electrolytes repeated to confirm. Performed at [facility], [address], [place], [place] [address]   Magnesium     Status: None   Collection Time: 03/08/22  4:59 AM  Result Value Ref Range   Magnesium 2.0 1.7 - 2.4 mg/dL    Comment: Performed at [facility], [address], [place], [place] [address]  Glucose, capillary     Status: Abnormal   Collection Time: 03/08/22  7:10 AM  Result Value Ref Range   Glucose-Capillary 114 (H) 70 - 99 mg/dL    Comment: Glucose reference range applies only to samples taken after fasting for at least 8 hours.  CBC with Differential/Platelet     Status: Abnormal   Collection Time: 03/08/22  7:19 AM  Result Value Ref Range   WBC 4.4 4.0 - 10.5 K/uL   RBC 3.66 (L) 4.22 - 5.81 MIL/uL   Hemoglobin 11.4 (L) 13.0 - 17.0 g/dL   HCT 34.2 (L) 39.0 - 52.0 %   MCV 93.4 80.0 - 100.0 fL   MCH 31.1 26.0 - 34.0 pg   MCHC 33.3 30.0 - 36.0 g/dL   RDW 13.7 11.5 - 15.5 %   Platelets 124 (L) 150 - 400 K/uL   nRBC 0.0 0.0 - 0.2 %   Neutrophils Relative % 37 %   Neutro Abs 1.6 (L) 1.7 - 7.7 K/uL   Lymphocytes Relative 47 %   Lymphs Abs 2.1 0.7 - 4.0 K/uL   Monocytes Relative 11 %   Monocytes Absolute 0.5 0.1 - 1.0 K/uL   Eosinophils Relative 4 %   Eosinophils Absolute 0.2 0.0 - 0.5 K/uL   Basophils Relative 1 %   Basophils Absolute 0.0 0.0 - 0.1 K/uL   Immature Granulocytes 0 %   Abs Immature Granulocytes 0.01 0.00 - 0.07 K/uL    Comment: Performed at [facility], [address] [place], [place] [address]  Glucose, capillary     Status: Abnormal   Collection Time: 03/08/22 11:07 AM  Result Value Ref Range   Glucose-Capillary 103 (H) 70 - 99 mg/dL    Comment: Glucose reference range applies only to samples taken after fasting for at least 8 hours.  Cortisol     Status: None   Collection Time: 03/08/22  3:08 PM  Result Value Ref Range   Cortisol, Plasma 9.2 ug/dL    Comment: (NOTE) AM  6.7 - 22.6 ug/dL PM   <03.2       ug/dL Performed at [facility], [address], [place], [place] [address]    Glucose, capillary     Status: Abnormal   Collection Time: 03/08/22  4:25 PM  Result Value Ref Range   Glucose-Capillary 105 (H) 70 - 99 mg/dL    Comment: Glucose reference range applies only to samples taken after fasting for at least 8 hours.  Basic metabolic panel     Status: Abnormal   Collection Time: 03/09/22  6:02 AM  Result Value Ref Range   Sodium 133 (L) 135 - 145 mmol/L   Potassium 4.5 3.5 - 5.1 mmol/L   Chloride 109 98 - 111 mmol/L   CO2 22 22 - 32 mmol/L   Glucose, Bld 171 (H) 70 - 99 mg/dL    Comment: Glucose reference range applies only to samples taken after fasting for at least 8 hours.   BUN 7 6 - 20 mg/dL   Creatinine, Ser 2.50 0.61 - 1.24 mg/dL   Calcium 8.0 (L) 8.9 - 10.3 mg/dL   GFR, Estimated >03 >70 mL/min    Comment: (NOTE) Calculated using the CKD-EPI Creatinine Equation (2021)    Anion gap 2 (L) 5 - 15    Comment: Electrolytes repeated to confirm. Performed at [facility], [address], [place], [place] [address]   Glucose, capillary     Status: Abnormal   Collection Time: 03/09/22  7:16 AM  Result Value Ref Range   Glucose-Capillary 160 (H) 70 - 99 mg/dL    Comment: Glucose reference range applies only to samples taken after fasting for at least 8 hours.    Medications:  Current Facility-Administered Medications  Medication Dose Route Frequency Provider Last Rate Last Admin   benztropine (COGENTIN) tablet 0.5 mg  0.5 mg Oral BID Stinson, Jacob J, DO   0.5 mg at 03/09/22 1694   Chlorhexidine Gluconate Cloth 2 % PADS 6 each  6 each Topical Daily Glade Lloyd, MD   6 each at 03/09/22 0916   dextrose 5 % and 0.9 % NaCl with KCl 40 mEq/L infusion   Intravenous Continuous Glade Lloyd, MD 75 mL/hr at 03/09/22 0306 New Bag at 03/09/22 0306   enoxaparin (LOVENOX) injection 40 mg  40 mg Subcutaneous Q24H Levie Heritage, DO   40 mg at 03/09/22 0903   escitalopram (LEXAPRO) tablet 10 mg  10 mg Oral Daily Levie Heritage, DO   10 mg at 03/09/22 0902    fluconazole (DIFLUCAN) tablet 100 mg  100 mg Oral Daily Vassie Loll, MD       fludrocortisone (FLORINEF) tablet 0.1 mg  0.1 mg Oral BID Vassie Loll, MD       Melene Muller ON 03/10/2022] fosfomycin (MONUROL) packet 3 g  3 g Oral Once Vassie Loll, MD       gabapentin (NEURONTIN) capsule 100 mg  100 mg Oral BID Levie Heritage, DO   100 mg at 03/09/22 0901   hydrocortisone (CORTEF) tablet 5 mg  5 mg Oral Daily Zierle-Ghosh, Asia B, DO   5 mg at 03/09/22 0058   LORazepam (ATIVAN) tablet 0.5 mg  0.5 mg Oral QHS Levie Heritage, DO   0.5 mg at 03/07/22 2128   meropenem (MERREM) 1 g in sodium chloride 0.9 % 100 mL IVPB  1 g Intravenous Myriam Jacobson, MD 200 mL/hr at 03/09/22 0624 1 g at 03/09/22 0624   metoCLOPramide (REGLAN) injection  10 mg  10 mg Intravenous Q6H PRN Aline August, MD   10 mg at 03/06/22 2154   ondansetron (ZOFRAN) injection 4 mg  4 mg Intravenous Q6H PRN Aline August, MD   4 mg at 03/07/22 2125   paliperidone (INVEGA) 24 hr tablet 3 mg  3 mg Oral QHS Truett Mainland, DO   3 mg at 03/08/22 2113   tamsulosin (FLOMAX) capsule 0.4 mg  0.4 mg Oral Daily Truett Mainland, DO   0.4 mg at 03/09/22 0902   Musculoskeletal: Strength & Muscle Tone: within normal limits Gait & Station: normal Patient leans: N/A  Psychiatric Specialty Exam:  Presentation  General Appearance:  Appropriate for Environment; Fairly Groomed; Casual  Eye Contact: Good  Speech: Clear and Coherent  Speech Volume: Normal  Handedness: Right  Mood and Affect  Mood: Depressed  Affect: Congruent  Thought Process  Thought Processes: Coherent  Descriptions of Associations:Intact  Orientation:Full (Time, Place and Person)  Thought Content:Logical  History of Schizophrenia/Schizoaffective disorder:No  Duration of Psychotic Symptoms:N/A  Hallucinations:Hallucinations: None  Ideas of Reference:None  Suicidal Thoughts:Suicidal Thoughts: No  Homicidal Thoughts:Homicidal  Thoughts: No  Sensorium  Memory: Immediate Fair; Recent Fair; Remote Fair  Judgment: Fair  Insight: Fair  Community education officer  Concentration: Good  Attention Span: Good  Recall: [place] of Knowledge: Fair  Language: Good  Psychomotor Activity  Psychomotor Activity: Psychomotor Activity: Decreased (Patient attempted to ambulate x 2 last night, but was weak and was lowered to the floor by staff.  Receiving physical therapy for strengthening.)  Assets  Assets: Communication Skills; Housing; Physical Health  Sleep  Sleep: Sleep: Fair (Due to diarrhea at night. Reported having BM x 5 last night.) Number of Hours of Sleep: 4 (Due to diarrhea at night. Reported having BM x 5 last night.)  Physical Exam: Physical Exam Vitals and nursing note reviewed.   Review of Systems  Constitutional: Negative.   HENT: Negative.    Eyes: Negative.   Respiratory: Negative.    Cardiovascular: Negative.   Gastrointestinal:  Positive for abdominal pain and diarrhea.       Rates abdominal pain as 6/10 with 10 being the worst.  Reports nausea is subsiding. Reports no vomiting today. Reports diarrhea x 5 last night.    Genitourinary: Negative.        Receiving treatment for UTI  Musculoskeletal: Negative.        Needs assistance with ambulation due to lower extremities weakness.  Skin: Negative.   Neurological:  Positive for weakness.       Lower legs weakness.  Receiving PT evaluation and treatment  Endo/Heme/Allergies: Negative.   Psychiatric/Behavioral:         History of major depressive disorder and hallucinations   Blood pressure 94/60, pulse 91, temperature 98.3 F (36.8 C), temperature source Oral, resp. rate 18, height 5\' 8"  (1.727 m), weight 49 kg, SpO2 99 %. Body mass index is 16.43 kg/m.  Treatment Plan Summary: Daily contact with patient to assess and evaluate symptoms and progress in treatment and Medication management.\  Plan: Plan: Psychosis: --Continue  Invega 3 mg p.o. nightly.   --Continue Cogentin 0.5 mg p.o. twice daily.     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 daily.    Anxiety: -- Continue lorazepam (Ativan) 0.5 mg p.o. nightly.   -- Continue gabapentin 100 mg p.o. twice daily.     Nausea: Currently on round-the-clock Zofran, last Zofran received was  03/07/2022.  And as needed Reglan, last Reglan received was 03/06/2022. -Patient also empirically started on IV Diflucan on 03/06/2022 with concerns for oropharyngeal candidiasis.  DC'd yesterday, but Diflucan restarted today 03/09/2022. -There might be a behavior component as well as patient is mostly holding his saliva in his mouth and spits it on the floor as per nursing staff.  This condition is improving.   Disposition: Recommend psychiatric Inpatient admission when medically cleared.  This service was provided via telemedicine using a 2-way, interactive audio and video technology.  Names of all persons participating in this telemedicine service and their role in this encounter. Name: Garrett [name] Role: Patient  Name: Garrison Columbus Role: NP provider  Name: Dr. Dwyane Dee Role: Medical Director  Name: Dr. Starla Link Role: Forestine Na, ED physician     Laretta Bolster, [place] 03/09/2022 11:02 AM

## 2022-03-09 NOTE — TOC Progression Note (Signed)
Transition of Care [facility]) - Progression Note    Patient Details  Name: Garrett [name] MRN: [id number] Date of Birth: Nov 21, 1985  [facility] CM/SW Contact  Annice Needy, LCSW Phone Number: 03/09/2022, 11:16 AM  Clinical Narrative:    Chel[place] with [facility] notified that patient is medically stable. Chelsea to begin looking for inpatient behavioral health placement.    Expected Discharge Plan: Group Home Barriers to Discharge: Continued Medical Work up  Expected Discharge Plan and Services Expected Discharge Plan: Group Home                                               Social Determinants of Health (SDOH) Interventions    Readmission Risk Interventions     No data to display

## 2022-03-09 NOTE — Progress Notes (Signed)
PROGRESS NOTE    Garrett [name]  SHF:[id number] DOB: Jul 05, 1985 DOA: 03/03/2022 PCP: Pcp, No   Brief Narrative:   36 y.o. 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  On presentation, chest x-ray was negative for acute abnormality.  He was started on IV Levaquin.  Because of persistent nausea and vomiting, he was switched to IV meropenem.  Assessment & Plan:   UTI -Recent diagnosis of Pseudomonas UTI as an outpatient for which she was placed on oral ciprofloxacin which led to nausea. -Initially started on IV Levaquin but because of persistent nausea and vomiting, antibiotics switched to IV meropenem.  Today is only day 5 of meropenem, urine culture on admission has been negative so far. -Planning treatment with one-time fosfomycin on 03/10/2022 to consolidate therapy. -Continue to maintain adequate hydration.  Nausea -Initially thought to be medication related: Currently on round-the-clock Zofran and as needed Reglan. -Patient also empirically started on IV Diflucan on 03/06/2022 with concerns for oropharyngeal candidiasis. -There might be a behavior component as well as patient is mostly holding his saliva in his mouth and spits it on the floor as per nursing staff -Continue the use of Diflucan and nystatin; planning to transition to oral route on 03/10/2022. -Nursing staff reporting improvement in the spitting/vomiting.  Urinary retention -Patient had recent urinary retention requiring Foley catheter placement which was removed as an outpatient by urology.  He had Foley catheter placement again which was removed in the ED on presentation.  Continue Flomax.    -Continue to have issues with urinary retention requiring in and out catheterization few times and subsequently Foley catheter was placed on 03/04/2022.   -Outpatient follow-up with urology will be needed.  Elevated LFTs History of chronic hep B -Patient has chronically elevated LFTs.  Right upper quadrant ultrasound showed mild gallbladder distention with biliary sludge but no signs of cholecystitis.   -LFTs slightly worsened today: Will repeat a.m. LFTs since the patient is currently now on Diflucan as well.   -Outpatient follow-up with ID  Hypokalemia -Improved. -Continue to follow electrolytes trend.  Hyponatremia/hypokalemia -Improved.  Still on IV fluids with supplemental potassium.  -Continue to follow electrolytes trend. -Stable currently.  Thrombocytopenia -Questionable cause, presumed to be associated with infection.  Resolved. -Continue to follow platelet count trend.  Diabetes mellitus type 2 with hypoglycemia -Blood sugars currently stable.  DC'd long-acting insulin due to episodes of hypoglycemia.   -Continue the use of D5 drip and follow CBGs fluctuation.. -Continue to follow CBGs and further advance diet.  Dysphagia -Resume likely associated with oral thrush.   -Diet as per SLP recommendations.  History of psychosis and depression -Continue benztropine, escitalopram, gabapentin, paliperidone, lorazepam if able to swallow.   -Appreciate consultation by psychiatry/TTS; currently recommending inpatient psych once medically stable. -Flat affect appreciated on exam.  No suicidal ideation.  Physical deconditioning -Patient has been evaluated by physical therapy with recommendation for home health PT at discharge. -Minimal intermittent supervision/assistance appreciated on assessment.  DVT prophylaxis: Lovenox Code Status: Full Family Communication: None at bedside Disposition Plan: Status is: Inpatient Remains inpatient appropriate because: Of severity of  illness.  Patient is overall medically stable and ready for placement in psychiatric facility if needed.   Consultants: Consult psychiatry Procedures: None  Antimicrobials:  Anti-infectives (From admission, onward)    Start     Dose/Rate Route Frequency Ordered Stop   03/10/22 1000  fosfomycin (MONUROL) packet 3 g        3 g Oral  Once 03/09/22 1006     03/09/22 1100  fluconazole (DIFLUCAN) tablet 100 mg        100 mg Oral Daily 03/09/22 1007     03/06/22 1530  fluconazole (DIFLUCAN) IVPB 100 mg  Status:  Discontinued        100 mg 50 mL/hr over 60 Minutes Intravenous Every 24 hours 03/06/22 1414 03/09/22 1007   03/05/22 2200  meropenem (MERREM) 1 g in sodium chloride 0.9 % 100 mL IVPB        1 g 200 mL/hr over 30 Minutes Intravenous Every 8 hours 03/05/22 0925 03/09/22 2359   03/03/22 1700  levofloxacin (LEVAQUIN) IVPB 750 mg  Status:  Discontinued        750 mg 100 mL/hr over 90 Minutes Intravenous Every 24 hours 03/03/22 1652 03/05/22 0815       Subjective: No further episode of vomiting; patient is afebrile and reports still having some intermittent nausea.  Expressed some loose stools overnight.  Objective: Vitals:   03/09/22 0029 03/09/22 0313 03/09/22 0624 03/09/22 1300  BP: 94/70 92/60 94/60  97/60  Pulse: 95 91  90  Resp: 17 17 18 17   Temp: 97.8 F (36.6 C) (!) 97.3 F (36.3 C) 98.3 F (36.8 C) 98.5 F (36.9 C)  TempSrc: Oral Oral Oral Oral  SpO2: 99% 100% 99% 100%  Weight:      Height:        Intake/Output Summary (Last 24 hours) at 03/09/2022 1747 Last data filed at 03/09/2022 1500 Gross per 24 hour  Intake 2250.62 ml  Output 1500 ml  Net 750.62 ml   Filed Weights   03/03/22 1540  Weight: 49 kg    Examination: General exam: Alert, awake, oriented x 3; reported having some loose stools but overall feeling better.  No fever, slightly nauseated but no further vomiting.  Reports improvement in his diet tolerance.  Oral examination demonstrating  significant improvement in thrush. Respiratory system: Clear to auscultation. Respiratory effort normal.  Good saturation on room air. Cardiovascular system:RRR. No rubs or gallops; no JVD. Gastrointestinal system: Abdomen is nondistended, soft and overall nontender to palpation.  Positive bowel sounds. Central nervous system: Alert and oriented. No focal neurological deficits. Extremities: No cyanosis, clubbing or edema. Skin: No petechiae. Psychiatry: Judgement and insight appear stable; no agitation or hallucinations currently.  Data Reviewed: I have personally reviewed following labs and imaging studies  CBC: Recent Labs  Lab 03/04/22 0413 03/05/22 0600 03/06/22 0532 03/07/22 0545 03/08/22 0719  WBC 6.1 5.1 4.7 5.1 4.4  NEUTROABS  --  3.1 1.8 1.8 1.6*  HGB 10.8* 11.2* 11.5* 11.5* 11.4*  HCT 33.1* 33.7* 33.7* 34.2* 34.2*  MCV 95.7 91.3 89.9 92.7 93.4  PLT 122* 127* 133* 150 124*   Basic Metabolic Panel: Recent Labs  Lab 03/05/22 0600 03/06/22 0532 03/07/22 0545 03/08/22 0459 03/09/22 0602  NA 132* 133* 136 136 133*  K 2.6* 3.4* 3.9 4.0 4.5  CL 98 102 105 106 109  CO2 29 28 25 28 22   GLUCOSE 131* 92 132* 83 171*  BUN 6 <5* <5* 5* 7  CREATININE 0.62 0.57* 0.68 0.78 0.67  CALCIUM 7.5* 7.9* 7.8* 7.9* 8.0*  MG 1.8 1.8 2.1 2.0  --    GFR: Estimated Creatinine Clearance:  86.8 mL/min (by C-G formula based on SCr of 0.67 mg/dL).  Liver Function Tests: Recent Labs  Lab 03/04/22 0413 03/05/22 0600 03/06/22 0532 03/07/22 0545 03/08/22 0459  AST 101* 100* 111* 153* 158*  ALT 109* 101* 105* 136* 140*  ALKPHOS 40 39 42 46 43  BILITOT 1.4* 2.0* 1.7* 1.8* 1.1  PROT 6.8 6.7 6.9 7.0 6.5  ALBUMIN 2.6* 2.5* 2.7* 2.7* 2.5*    Recent Labs  Lab 03/06/22 0532  AMMONIA 31   CBG: Recent Labs  Lab 03/08/22 1107 03/08/22 1625 03/09/22 0716 03/09/22 1107 03/09/22 1621  GLUCAP 103* 105* 160* 142* 127*    Recent Results (from the past 240 hour(s))  Urine Culture      Status: None   Collection Time: 03/04/22  1:24 PM   Specimen: Urine, Clean Catch  Result Value Ref Range Status   Specimen Description   Final    URINE, CLEAN CATCH Performed at [facility], [address], [place], [place] [address]    Special Requests   Final    NONE Performed at [facility], [address], [place], [place] [address]    Culture   Final    NO GROWTH Performed at [facility], [address] [place], [place] [address]    Report Status 03/05/2022 FINAL  Final     Radiology Studies: No results found.   Scheduled Meds:  benztropine  0.5 mg Oral BID   Chlorhexidine Gluconate Cloth  6 each Topical Daily   enoxaparin (LOVENOX) injection  40 mg Subcutaneous Q24H   escitalopram  10 mg Oral Daily   fluconazole  100 mg Oral Daily   fludrocortisone  0.1 mg Oral BID   [START ON 03/10/2022] fosfomycin  3 g Oral Once   gabapentin  100 mg Oral BID   hydrocortisone  5 mg Oral Daily   LORazepam  0.5 mg Oral QHS   paliperidone  3 mg Oral QHS   tamsulosin  0.4 mg Oral Daily   Continuous Infusions:  dextrose 5 % and 0.9 % NaCl with KCl 40 mEq/L 75 mL/hr at 03/09/22 0306   meropenem (MERREM) IV 1 g (03/09/22 1455)    Vassie Loll, MD [facility] 03/09/2022, 5:47 PM

## 2022-03-09 NOTE — Progress Notes (Signed)
Physical Therapy Treatment Patient Details Name: Garrett [name] MRN: [id number] DOB: 01-Jul-1985 Today's Date: 03/09/2022   History of Present Illness Garrett [name] is a 36 y.o. 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  History is obtained with use of interpreter.  Patient is a ward of the state and lives in a group home.  He was sent to the hospital due to nausea and vomiting.  He was diagnosed with a UTI about 5 days ago and was prescribed an antibiotic.  This antibiotic was changed to ciprofloxacin because the urine culture grew out Pseudomonas.  Unfortunately, every time he takes his antibiotic, he is unable to keep anything down and vomits.  His appetite has been poor.  He reports no fevers or chills.  He is come to the hospital for evaluation.    PT Comments    Interpreter used today [place], [id number].  Pt required increased motivation today for physical therapy treatment session. Today's session addressed BLE strengthening to improve pt's performance with functional OOB mobility. Pt only willing to participate at bed level this day. Pt noted with improved motivation to perform in TE, pt perform modified independent bed mobility when performing LAQ at EOB. Pt continuing to demonstrate poor attention to task, requiring increased verbal and tactile cues to perform full arc of motion for BLE TE. Pt would continue to benefit from skilled acute physical therapy services in order to progress toward POC goals, safety/independence with functional mobility and QOL.    Recommendations for follow up therapy are one component of a multi-disciplinary discharge planning process, led by the attending physician.  Recommendations may be updated based on patient status, additional functional criteria and insurance authorization.  Follow Up Recommendations  Home health PT     Assistance Recommended at  Discharge Intermittent Supervision/Assistance  Patient can return home with the following A little help with walking and/or transfers;A little help with bathing/dressing/bathroom;Help with stairs or ramp for entrance;Assistance with cooking/housework   Equipment Recommendations       Recommendations for Other Services       Precautions / Restrictions Restrictions Weight Bearing Restrictions: No     Mobility  Bed Mobility Overal bed mobility: Modified Independent             General bed mobility comments: slightly labored movement, supine to sitting EOB.    Transfers                        Ambulation/Gait                   Stairs             Wheelchair Mobility    Modified Rankin (Stroke Patients Only)       Balance Overall balance assessment: Needs assistance Sitting-balance support: Single extremity supported, Feet unsupported Sitting balance-Leahy Scale: Fair Sitting balance - Comments: fair sitting balance at EOB for TE. Min guard for sitting balance. Postural control: Right lateral lean                                  Cognition Arousal/Alertness: Awake/alert Behavior During Therapy: WFL for tasks assessed/performed Overall Cognitive Status: Within Functional Limits for tasks assessed  Exercises General Exercises - Lower Extremity Gluteal Sets: 20 reps, Strengthening, AROM, Supine (hip bridges, cues given for sequencing of motion of arc and isometric hold.) Long Arc Quad: Seated, AROM, Strengthening, 10 reps (cues given for sequencing of motion of arc and isometric hold.) Hip ABduction/ADduction: AROM, 10 reps, Strengthening, Supine (cues given for sequencing of motion of arc and isometric hold.)    General Comments        Pertinent Vitals/Pain Pain Assessment Pain Assessment: No/denies pain    Home Living                          Prior  Function            PT Goals (current goals can now be found in the care plan section) Acute Rehab PT Goals Patient Stated Goal: return home PT Goal Formulation: With patient Time For Goal Achievement: 03/10/22 Potential to Achieve Goals: Good Progress towards PT goals: Progressing toward goals    Frequency    Min 3X/week      PT Plan Current plan remains appropriate    Co-evaluation              AM-PAC PT "6 Clicks" Mobility   Outcome Measure  Help needed turning from your back to your side while in a flat bed without using bedrails?: None Help needed moving from lying on your back to sitting on the side of a flat bed without using bedrails?: None Help needed moving to and from a bed to a chair (including a wheelchair)?: A Little Help needed standing up from a chair using your arms (e.g., wheelchair or bedside chair)?: A Little Help needed to walk in hospital room?: A Lot Help needed climbing 3-5 steps with a railing? : A Lot 6 Click Score: 18    End of Session   Activity Tolerance: Patient tolerated treatment well;Patient limited by fatigue Patient left: with call bell/phone within reach;in chair;with chair alarm set Nurse Communication: Mobility status PT Visit Diagnosis: Unsteadiness on feet (R26.81);Other abnormalities of gait and mobility (R26.89);Muscle weakness (generalized) (M62.81)     Time: 6063-0160 PT Time Calculation (min) (ACUTE ONLY): 18 min  Charges:  $Therapeutic Exercise: 8-22 mins                     Nelida Meuse PT, DPT Physical Therapist with [facility] [phone] office   Nelida Meuse 03/09/2022, 11:50 AM

## 2022-03-10 DIAGNOSIS — F333 Major depressive disorder, recurrent, severe with psychotic symptoms: Secondary | ICD-10-CM | POA: Diagnosis not present

## 2022-03-10 DIAGNOSIS — R7401 Elevation of levels of liver transaminase levels: Secondary | ICD-10-CM | POA: Diagnosis not present

## 2022-03-10 DIAGNOSIS — R112 Nausea with vomiting, unspecified: Secondary | ICD-10-CM

## 2022-03-10 DIAGNOSIS — N3000 Acute cystitis without hematuria: Secondary | ICD-10-CM

## 2022-03-10 LAB — GASTROINTESTINAL PANEL BY PCR, STOOL (REPLACES STOOL CULTURE)

## 2022-03-10 MED ORDER — PROMETHAZINE HCL 25 MG RE SUPP: 25.0000 mg | Freq: Three times a day (TID) | RECTAL | 0 refills | Status: AC | PRN

## 2022-03-10 MED ORDER — FLUCONAZOLE 100 MG PO TABS: 100.0000 mg | ORAL_TABLET | Freq: Every day | ORAL | 0 refills | Status: AC

## 2022-03-10 MED ORDER — INSULIN GLARGINE 100 UNIT/ML SOLOSTAR PEN: 10.0000 [IU] | PEN_INJECTOR | Freq: Every day | SUBCUTANEOUS | 3 refills | Status: DC

## 2022-03-10 MED ORDER — MIDODRINE HCL 10 MG PO TABS: 10.0000 mg | ORAL_TABLET | Freq: Three times a day (TID) | ORAL | 0 refills | Status: AC

## 2022-03-10 NOTE — NC FL2 (Signed)
[place] MEDICAID FL2 LEVEL OF CARE FORM     IDENTIFICATION  Patient Name: Garrett [name] Birthdate: 20-Oct-1985 Sex: male Admission Date (Current Location): 03/03/2022  [place] and [place] Number:  [place] [id number] [place] and Address:  [facility] [address], [place]      Provider Number: [id number]  Attending Physician Name and Address:  Barton Dubois, MD  Relative Name and Phone Number:  Lenn Cal  [phone]    Current Level of Care: Hospital Recommended Level of Care: [facility] Prior Approval Number:    Date Approved/Denied:   PASRR Number:    Discharge Plan: Domiciliary (Rest home)    Current Diagnoses: Patient Active Problem List   Diagnosis Date Noted   Acute cystitis without hematuria 03/10/2022   Intractable nausea and vomiting 03/10/2022   Hypotension 01/26/2022   Hypoalbuminemia 01/24/2022   Hyponatremia 01/24/2022   Postoperative intra-abdominal abscess 01/20/2022   Severe malnutrition ([place]) 01/18/2022   Chronic hepatitis B without hepatic coma ([place]) 01/14/2022   Transaminitis 09/26/2021   Constipation 06/29/2021   Peripheral neuropathy 06/28/2021   Generalized weakness    Low back pain 05/24/2021   Major depressive disorder, recurrent episode, moderate with mood-congruent psychotic features ([place])    Chronic Hepatitis B 05/12/2021   Physical deconditioning    Anemia 05/05/2021   Altered mental status    Controlled type 2 diabetes mellitus without complication, with long-term current use of insulin ([place]) 01/17/5101   Acute metabolic encephalopathy 58/52/7782   Elevated LFTs 03/09/2020   Major depressive disorder, recurrent severe without psychotic features ([place]) 06/20/2017   Depression, major, recurrent, severe with psychosis ([place]) 06/19/2017    Orientation RESPIRATION BLADDER Height & Weight     Self, Situation, Place  Normal Continent Weight: 108 lb 0.4 oz (49 kg) Height:  _0  (172.7  cm)  BEHAVIORAL SYMPTOMS/MOOD NEUROLOGICAL BOWEL NUTRITION STATUS      Continent Diet (heart healthy/carb modified)  AMBULATORY STATUS COMMUNICATION OF NEEDS Skin   Limited Assist Verbally Normal                       Personal Care Assistance Level of Assistance  Bathing, Feeding, Dressing Bathing Assistance: Limited assistance Feeding assistance: Limited assistance Dressing Assistance: Limited assistance     Functional Limitations Info  Sight, Hearing, Speech Sight Info: Adequate Hearing Info: Adequate Speech Info: Adequate    SPECIAL CARE FACTORS FREQUENCY                       Contractures Contractures Info: Not present    Additional Factors Info  Code Status, Allergies, Psychotropic Code Status Info: Full Allergies Info: NKA Psychotropic Info: Lexapro, Ativan         Current Medications (03/10/2022):  This is the current hospital active medication list Current Facility-Administered Medications  Medication Dose Route Frequency Provider Last Rate Last Admin   benztropine (COGENTIN) tablet 0.5 mg  0.5 mg Oral BID Truett Mainland, DO   0.5 mg at 03/10/22 4235   Chlorhexidine Gluconate Cloth 2 % PADS 6 each  6 each Topical Daily Aline August, MD   6 each at 03/10/22 0817   dextrose 5 % and 0.9 % NaCl with KCl 40 mEq/L infusion   Intravenous Continuous Aline August, MD 75 mL/hr at 03/10/22 0818 New Bag at 03/10/22 0818   enoxaparin (LOVENOX) injection 40 mg  40 mg Subcutaneous Q24H Truett Mainland, DO   40 mg  at 03/10/22 0817   escitalopram (LEXAPRO) tablet 10 mg  10 mg Oral Daily Truett Mainland, DO   10 mg at 03/10/22 0813   fluconazole (DIFLUCAN) tablet 100 mg  100 mg Oral Daily Barton Dubois, MD   100 mg at 03/10/22 0813   fludrocortisone (FLORINEF) tablet 0.1 mg  0.1 mg Oral BID Barton Dubois, MD   0.1 mg at 03/10/22 0813   gabapentin (NEURONTIN) capsule 100 mg  100 mg Oral BID Truett Mainland, DO   100 mg at 03/10/22 0813   hydrocortisone  (CORTEF) tablet 5 mg  5 mg Oral Daily Zierle-Ghosh, Asia B, DO   5 mg at 03/10/22 0813   LORazepam (ATIVAN) tablet 0.5 mg  0.5 mg Oral QHS Truett Mainland, DO   0.5 mg at 03/09/22 2100   metoCLOPramide (REGLAN) injection 10 mg  10 mg Intravenous Q6H PRN Aline August, MD   10 mg at 03/06/22 2154   ondansetron (ZOFRAN) injection 4 mg  4 mg Intravenous Q6H PRN Aline August, MD   4 mg at 03/07/22 2125   paliperidone (INVEGA) 24 hr tablet 3 mg  3 mg Oral QHS Stinson, Jacob J, DO   3 mg at 03/09/22 2100   tamsulosin (FLOMAX) capsule 0.4 mg  0.4 mg Oral Daily Truett Mainland, DO   0.4 mg at 03/10/22 8502     Discharge Medications: Accu-Chek Guide test strip Generic drug: glucose blood Use to check blood sugars up to 4 times daily as directed.    Accu-Chek Guide w/Device Kit Use to test blood sugars up to 4 times daily as directed.    Accu-Chek Softclix Lancets lancets Use to test blood sugars up to 4 times daily as directed.    acetaminophen 325 MG tablet Commonly known as: TYLENOL Take 2 tablets (650 mg total) by mouth every 6 (six) hours as needed for mild pain or moderate pain.    Anti-Diarrheal 2 MG tablet Generic drug: loperamide Take by mouth.    benztropine 0.5 MG tablet Commonly known as: COGENTIN Take 1 tablet (0.5 mg total) by mouth 2 (two) times daily.    Dexcom G6 Receiver Devi 1 Device by Does not apply route daily.    Dexcom G6 Sensor Misc 1 packet by Does not apply route daily.    escitalopram 10 MG tablet Commonly known as: LEXAPRO Take 1 tablet (10 mg total) by mouth daily.    feeding supplement (GLUCERNA SHAKE) Liqd Take 237 mLs by mouth 3 (three) times daily between meals.    fluconazole 100 MG tablet Commonly known as: DIFLUCAN Take 1 tablet (100 mg total) by mouth daily for 4 days. Start taking on: March 11, 2022    gabapentin 100 MG capsule Commonly known as: NEURONTIN Take 1 capsule (100 mg total) by mouth 2 (two) times daily.    insulin  glargine 100 UNIT/ML Solostar Pen Commonly known as: LANTUS Inject 10 Units into the skin daily.    LORazepam 0.5 MG tablet Commonly known as: ATIVAN Take 1 tablet (0.5 mg total) by mouth at bedtime.    miconazole nitrate Powd Commonly known as: MICATIN Apply 1 application. topically 3 (three) times daily.    midodrine 10 MG tablet Commonly known as: PROAMATINE Take 1 tablet (10 mg total) by mouth 3 (three) times daily with meals.    NovoLOG FlexPen 100 UNIT/ML FlexPen Generic drug: insulin aspart Inject 0-6 Units into the skin 3 (three) times daily with meals. What changed: Another medication with  the same name was removed. Continue taking this medication, and follow the directions you see here.    ondansetron 4 MG disintegrating tablet Commonly known as: ZOFRAN-ODT Take 1 tablet (4 mg total) by mouth every 8 (eight) hours as needed for up to 15 doses for nausea or vomiting.    paliperidone 3 MG 24 hr tablet Commonly known as: INVEGA Take 1 tablet (3 mg total) by mouth at bedtime.    Pentips 32G X 4 MM Misc Generic drug: Insulin Pen Needle Use to inject insulin up to 4 times daily.    promethazine 25 MG suppository Commonly known as: PHENERGAN Place 1 suppository (25 mg total) rectally every 8 (eight) hours as needed for refractory nausea / vomiting. What changed:  when to take this reasons to take this    tamsulosin 0.4 MG Caps capsule Commonly known as: FLOMAX Take 1 capsule (0.4 mg total) by mouth daily.   Relevant Imaging Results:  Relevant Lab Results:   Additional Information    Iona Beard, LCSWA

## 2022-03-10 NOTE — Progress Notes (Signed)
Patient slept through the night. No complaints of pain. Tele sitter still in room with patient.

## 2022-03-10 NOTE — TOC Transition Note (Signed)
Transition of Care [facility]) - CM/SW Discharge Note   Patient Details  Name: Garrett [name] MRN: [id number] Date of Birth: 05-06-1985  [facility]) CM/SW Contact:  Villa Herb, LCSWA Phone Number: 03/10/2022, 3:08 PM  Clinical Narrative:    CSW updated that pt has been psych cleared. CSW spoke to facility owner Roddie Mc who states pt can return. CSW explained that pt was recommended for [facility] PT and no agency able to be found. Roddie Mc is agreeable to outpatient PT referral. CSW sent updated Fl2 and D/C summary to [facility]. CSW requested that RN reach out to [place] and provide update at [facility] request. Roddie Mc states facility will be able to provide transportation back to facility. TOC signing off.   Final next level of care: OP Rehab Barriers to Discharge: Barriers Resolved   Patient Goals and CMS Choice Patient states their goals for this hospitalization and ongoing recovery are:: return to ALF CMS Medicare.gov Compare Post Acute Care list provided to:: Patient Choice offered to / list presented to : [facility] POA / Guardian, Patient  Discharge Placement                Patient to be transferred to facility by: facility Name of family member notified: Roddie Mc (facility owner) Patient and family notified of of transfer: 03/10/22  Discharge Plan and Services                DME Arranged: Dan Humphreys DME Agency: [employer] Date DME Agency Contacted: 03/10/22   Representative spoke with at DME Agency: Belenda Cruise            Social Determinants of Health (SDOH) Interventions     Readmission Risk Interventions     No data to display

## 2022-03-10 NOTE — Consult Note (Signed)
Telepsych Consultation   Reason for Consult:  psych consult Referring Physician:  Glade Lloyd, MD  Location of Patient: [facility] [phone] Location of Provider: Behavioral Health TTS Department  Patient Identification: Garrett [name] MRN:  [id number] Principal Diagnosis: UTI (urinary tract infection) Diagnosis:  Active Problems:   Depression, major, recurrent, severe with psychosis (HCC)   Controlled type 2 diabetes mellitus without complication, with long-term current use of insulin (HCC)   Elevated LFTs   Chronic Hepatitis B   Total Time spent with patient: 30 minutes  Subjective:   Garrett [name] is a 36 y.o. male patient admitted with acute cystitis with hematuria who presented to [facility] via EMS from living facility for ongoing nausea and vomiting, poor PO intake.  This assessment was completed via  Interpreter# [id number] Garrett [name] ([employer])  Patient presents laying in bed; casually sits up during assessment. Alert and oriented; engaging in assessment. Able to state full name, birth date, today's date appropriately with interpreter; "Its Friday. December. 8, 2023. I came to the hospital because of belly pain".  Reports pain is gone. Endorses being able to sleep. Appropriately tracks during conversation between provider and interpreter. Blunt/Flat affect. Reports he is "doing good".  Describes mood as "I'm good". Thought content appears logical and linear in response to questions being asked. Denies any thoughts of wanting to harm himself or anyone, auditory or visual hallucinations. Provider inquired about possible family to contact for collateral information, he reports "I have no family here". Denies any safety concerns at current facility  Collateral: [facility] [phone]  Roddie Mc (owner/administrator) States facility inherited patient from business they acquired since 02/01/22. Have had limited communication due to language barrier. States patient initially had poor intake then stopped  eating and taking medication more than 7 days and was becoming weak having multiple falls. Paperwork states patient has history of depression and schizophrenia. Has never witnessed patient talk or speak. Pushes staff away when staff attempt to bath or feed him. He was living with someone who returned to home country; woman contacted owner after mail came to her house. Who reported patient was an alcoholic and had gotten to the point he couldn't do anything for himself and they sent him to the hospital where they lost contact with him. Says woman came to visit patient but he refused to speak to them. No known family in the area. Denies any active safety concerns with patient returning to the facility.    HPI: Garrett [name] is a 36 y.o. male with a past psychiatric history of major depressive disorder with psychosis, antipsychotic and antidepressant medication treatment. Recent medical history of diabetes, acute perforated appendicitis, acute suppurative peritonitis perforated bowel resulting in appendectomy (01/12/22) with multiple ED presentations related to UTI symptoms and other sequalae (10/30, 11/25, 11/27, 12/01). He initially presented to [facility] ED due to increased abdominal pain, ongoing nausea and vomiting, with failure to thrive. Recent diagnosis of UTI about 5 days ago with antibiotic therapy; ABT changed to ciprofloxacin due to culture returning positive for Pseudomonas. Per chart review, pt unable to tolerate anything PO reports poor appetite due to ongoing nausea and vomiting. Denies any fevers or chills. History is obtained with use of interpreter.  Patient is a ward of the state and lives in a group home ([facility]). Most recent psychotropic medication includes paliperidone 3 mg daily HS, Lorazepam 0.5 mg daily HS, Escitalopram 10 mg daily, benztropine 0.5 mg BID.  UDS-. BAL<10. Last LFTs: AST 158, ALT 140 (12/06), Ammonia  31 (12/04), Ca 8.0 (12/07), Na 133 (12/07).  Past  Psychiatric History: major depressive disorder with psychosis; [facility] admissions (2010, 2019, 2020, 2022)  Risk to Self:  pt denies via interpreter Risk to Others:  pt denies via interpreter Prior Inpatient Therapy:  yes Prior Outpatient Therapy:  yes  Past Medical History:  Past Medical History:  Diagnosis Date   Acute metabolic encephalopathy 03/09/2020   Altered mental status    Anemia 05/05/2021   Chronic Hepatitis B 05/12/2021   Chronic hepatitis B without hepatic coma (HCC) 01/14/2022   Depression    Diabetes mellitus (HCC)    Elevated LFTs 03/09/2020   Hypoalbuminemia 01/24/2022   Hyponatremia 01/24/2022   Hypotension 01/26/2022   Major depressive disorder, recurrent episode, moderate with mood-congruent psychotic features (HCC)    Major depressive disorder, recurrent severe without psychotic features (HCC) 06/20/2017   Physical deconditioning    Postoperative intra-abdominal abscess 01/20/2022   Severe malnutrition (HCC) 01/18/2022   Severe malnutrition (HCC) 01/18/2022    Past Surgical History:  Procedure Laterality Date   APPENDECTOMY     EXPLORATORY LAPAROTOMY     Family History: History reviewed. No pertinent family history. Family Psychiatric  History: not noted Social History:  Social History   Substance and Sexual Activity  Alcohol Use Not Currently   Comment: occassionally     Social History   Substance and Sexual Activity  Drug Use Never    Social History   Socioeconomic History   Marital status: Single    Spouse name: Not on file   Number of children: Not on file   Years of education: Not on file   Highest education level: Not on file  Occupational History   Not on file  Tobacco Use   Smoking status: Former    Types: Cigarettes   Smokeless tobacco: Never  Substance and Sexual Activity   Alcohol use: Not Currently    Comment: occassionally   Drug use: Never   Sexual activity: Not on file  Other Topics Concern   Not on file  Social  History Narrative   Not on file   Social Determinants of Health   Financial Resource Strain: Not on file  Food Insecurity: Not on file  Transportation Needs: Not on file  Physical Activity: Not on file  Stress: Not on file  Social Connections: Not on file   Additional Social History:  Alcohol abuse per facility owner Roddie Mc  Allergies:  No Known Allergies  Labs:  Results for orders placed or performed during the hospital encounter of 03/03/22 (from the past 48 hour(s))  Cortisol     Status: None   Collection Time: 03/08/22  3:08 PM  Result Value Ref Range   Cortisol, Plasma 9.2 ug/dL    Comment: (NOTE) AM    6.7 - 22.6 ug/dL PM   <35.3       ug/dL Performed at [facility], [address] [place], [place] [address]   Glucose, capillary     Status: Abnormal   Collection Time: 03/08/22  4:25 PM  Result Value Ref Range   Glucose-Capillary 105 (H) 70 - 99 mg/dL    Comment: Glucose reference range applies only to samples taken after fasting for at least 8 hours.  Gastrointestinal Panel by PCR , Stool     Status: None   Collection Time: 03/09/22  4:26 AM   Specimen: Stool  Result Value Ref Range   Campylobacter species NOT DETECTED NOT DETECTED   Plesimonas shigelloides  NOT DETECTED NOT DETECTED   Salmonella species NOT DETECTED NOT DETECTED   Yersinia enterocolitica NOT DETECTED NOT DETECTED   Vibrio species NOT DETECTED NOT DETECTED   Vibrio cholerae NOT DETECTED NOT DETECTED   Enteroaggregative E coli (EAEC) NOT DETECTED NOT DETECTED   Enteropathogenic E coli (EPEC) NOT DETECTED NOT DETECTED   Enterotoxigenic E coli (ETEC) NOT DETECTED NOT DETECTED   Shiga like toxin producing E coli (STEC) NOT DETECTED NOT DETECTED   Shigella/Enteroinvasive E coli (EIEC) NOT DETECTED NOT DETECTED   Cryptosporidium NOT DETECTED NOT DETECTED   Cyclospora cayetanensis NOT DETECTED NOT DETECTED   Entamoeba histolytica NOT DETECTED NOT DETECTED   Giardia lamblia NOT DETECTED NOT  DETECTED   Adenovirus F40/41 NOT DETECTED NOT DETECTED   Astrovirus NOT DETECTED NOT DETECTED   Norovirus GI/GII NOT DETECTED NOT DETECTED   Rotavirus A NOT DETECTED NOT DETECTED   Sapovirus (I, II, IV, and V) NOT DETECTED NOT DETECTED    Comment: Performed at [facility], [address] [place], [place] [address]  Basic metabolic panel     Status: Abnormal   Collection Time: 03/09/22  6:02 AM  Result Value Ref Range   Sodium 133 (L) 135 - 145 mmol/L   Potassium 4.5 3.5 - 5.1 mmol/L   Chloride 109 98 - 111 mmol/L   CO2 22 22 - 32 mmol/L   Glucose, Bld 171 (H) 70 - 99 mg/dL    Comment: Glucose reference range applies only to samples taken after fasting for at least 8 hours.   BUN 7 6 - 20 mg/dL   Creatinine, Ser 3.81 0.61 - 1.24 mg/dL   Calcium 8.0 (L) 8.9 - 10.3 mg/dL   GFR, Estimated >82 >99 mL/min    Comment: (NOTE) Calculated using the CKD-EPI Creatinine Equation (2021)    Anion gap 2 (L) 5 - 15    Comment: Electrolytes repeated to confirm. Performed at [facility], [address], [place], [place] [address]   Glucose, capillary     Status: Abnormal   Collection Time: 03/09/22  7:16 AM  Result Value Ref Range   Glucose-Capillary 160 (H) 70 - 99 mg/dL    Comment: Glucose reference range applies only to samples taken after fasting for at least 8 hours.  Glucose, capillary     Status: Abnormal   Collection Time: 03/09/22 11:07 AM  Result Value Ref Range   Glucose-Capillary 142 (H) 70 - 99 mg/dL    Comment: Glucose reference range applies only to samples taken after fasting for at least 8 hours.  Glucose, capillary     Status: Abnormal   Collection Time: 03/09/22  4:21 PM  Result Value Ref Range   Glucose-Capillary 127 (H) 70 - 99 mg/dL    Comment: Glucose reference range applies only to samples taken after fasting for at least 8 hours.  Glucose, capillary     Status: Abnormal   Collection Time: 03/09/22  8:59 PM  Result Value Ref Range   Glucose-Capillary 135  (H) 70 - 99 mg/dL    Comment: Glucose reference range applies only to samples taken after fasting for at least 8 hours.    Medications:  Current Facility-Administered Medications  Medication Dose Route Frequency Provider Last Rate Last Admin   benztropine (COGENTIN) tablet 0.5 mg  0.5 mg Oral BID Levie Heritage, DO   0.5 mg at 03/10/22 6789   Chlorhexidine Gluconate Cloth 2 % PADS 6 each  6 each Topical Daily Glade Lloyd, MD  6 each at 03/10/22 0817   dextrose 5 % and 0.9 % NaCl with KCl 40 mEq/L infusion   Intravenous Continuous Glade LloydAlekh, Kshitiz, MD 75 mL/hr at 03/10/22 0818 New Bag at 03/10/22 0818   enoxaparin (LOVENOX) injection 40 mg  40 mg Subcutaneous Q24H Levie HeritageStinson, Jacob J, DO   40 mg at 03/10/22 0817   escitalopram (LEXAPRO) tablet 10 mg  10 mg Oral Daily Levie HeritageStinson, Jacob J, DO   10 mg at 03/10/22 0813   fluconazole (DIFLUCAN) tablet 100 mg  100 mg Oral Daily Vassie LollMadera, Carlos, MD   100 mg at 03/10/22 0813   fludrocortisone (FLORINEF) tablet 0.1 mg  0.1 mg Oral BID Vassie LollMadera, Carlos, MD   0.1 mg at 03/10/22 0813   gabapentin (NEURONTIN) capsule 100 mg  100 mg Oral BID Levie HeritageStinson, Jacob J, DO   100 mg at 03/10/22 0813   hydrocortisone (CORTEF) tablet 5 mg  5 mg Oral Daily Zierle-Ghosh, Asia B, DO   5 mg at 03/10/22 0813   LORazepam (ATIVAN) tablet 0.5 mg  0.5 mg Oral QHS Levie HeritageStinson, Jacob J, DO   0.5 mg at 03/09/22 2100   metoCLOPramide (REGLAN) injection 10 mg  10 mg Intravenous Q6H PRN Glade LloydAlekh, Kshitiz, MD   10 mg at 03/06/22 2154   ondansetron (ZOFRAN) injection 4 mg  4 mg Intravenous Q6H PRN Glade LloydAlekh, Kshitiz, MD   4 mg at 03/07/22 2125   paliperidone (INVEGA) 24 hr tablet 3 mg  3 mg Oral QHS Stinson, Jacob J, DO   3 mg at 03/09/22 2100   tamsulosin (FLOMAX) capsule 0.4 mg  0.4 mg Oral Daily Levie HeritageStinson, Jacob J, DO   0.4 mg at 03/10/22 [id number]    Musculoskeletal: Strength & Muscle Tone: decreased Gait & Station: normal Patient leans: N/A  Psychiatric Specialty Exam:  Presentation  General  Appearance:  Appropriate for Environment; Fairly Groomed  Eye Contact: Good  Speech: Clear and Coherent  Speech Volume: Normal  Handedness: Right   Mood and Affect  Mood: Euthymic  Affect: Blunt   Thought Process  Thought Processes: Coherent  Descriptions of Associations:Intact  Orientation:Full (Time, Place and Person)  Thought Content:Logical  History of Schizophrenia/Schizoaffective disorder:No  Duration of Psychotic Symptoms:N/A  Hallucinations:Hallucinations: None  Ideas of Reference:None  Suicidal Thoughts:Suicidal Thoughts: No  Homicidal Thoughts:Homicidal Thoughts: No   Sensorium  Memory: Immediate Fair; Recent Fair  Judgment: Fair  Insight: Fair   Chartered certified accountantxecutive Functions  Concentration: Fair  Attention Span: Fair  Recall: [employer]Fair  Fund of Knowledge: Fair  Language: Fair   Psychomotor Activity  Psychomotor Activity: Psychomotor Activity: Decreased   Assets  Assets: Physical Health; Resilience; Social Support; Housing   Sleep  Sleep: Sleep: Good Number of Hours of Sleep: 4 (Due to diarrhea at night. Reported having BM x 5 last night.)   Physical Exam: Physical Exam Vitals and nursing note reviewed.  Constitutional:      Appearance: He is normal weight. He is not ill-appearing or toxic-appearing.  HENT:     Head: Normocephalic.     Nose: Nose normal.     Mouth/Throat:     Mouth: Mucous membranes are moist.     Pharynx: Oropharynx is clear.  Eyes:     Pupils: Pupils are equal, round, and reactive to light.  Cardiovascular:     Rate and Rhythm: Normal rate.     Pulses: Normal pulses.  Pulmonary:     Effort: Pulmonary effort is normal.  Abdominal:     Palpations: Abdomen is soft.  Musculoskeletal:        General: Normal range of motion.     Cervical back: Normal range of motion.  Skin:    General: Skin is warm and dry.  Neurological:     Mental Status: He is alert and oriented to person, place, and time.   Psychiatric:        Attention and Perception: He does not perceive auditory or visual hallucinations.        Mood and Affect: Affect is flat.        Speech: Speech normal.        Behavior: Behavior is cooperative.        Thought Content: Thought content normal.        Cognition and Memory: Cognition and memory normal.        Judgment: Judgment normal.    Review of Systems  Psychiatric/Behavioral:  Negative for depression, hallucinations and suicidal ideas.   All other systems reviewed and are negative.  Blood pressure (!) 88/52, pulse 98, temperature 98.4 F (36.9 C), resp. rate 18, height 5\' 8"  (1.727 m), weight 49 kg, SpO2 100 %. Body mass index is 16.43 kg/m.  Treatment Plan Summary: Daily contact with patient to assess and evaluate symptoms and progress in treatment, Medication management, and Plan patient denies any suicidal or homicidal ideations, auditory or visual hallucinations, and does not appear to be actively psychotic or responding to any external/internal stimuli at this time. Patient currently lives in facility where he receives 24 hour care and medication management. Since admission to the hospital patient has increased oral intake and medication compliance. He has not displayed any active psychotic or concerning behaviors. Patient to be psychiatrically cleared; psychiatry will remain available for any future needed medication adjustments via consults.   Disposition: No evidence of imminent risk to self or others at present.   Patient does not meet criteria for psychiatric inpatient admission. Supportive therapy provided about ongoing stressors. Discussed crisis plan, support from social network, calling 911, coming to the Emergency Department, and calling Suicide Hotline.  This service was provided via telemedicine using a 2-way, interactive audio and video technology.  Names of all persons participating in this telemedicine service and their role in this  encounter. Name: Role: PMHNP  Name: Maxie Barb Role: Attending MD  Name: Nelly Rout Role: patient  Name: [name] Role: facility owner    Layne Benton, NP 03/10/2022 2:18 PM

## 2022-03-10 NOTE — Discharge Summary (Signed)
Physician Discharge Summary   Patient: Garrett [name] MRN: [id number] DOB: 10/13/1985  Admit date:     03/03/2022  Discharge date: 03/10/22  Discharge Physician: Barton Dubois   PCP: Pcp, No   Recommendations at discharge:  Repeat basic metabolic panel to follow electrolytes and renal function Close monitoring of patient's CBG with further adjustment to hypoglycemic regimen as needed Make sure patient has follow-up with urology service for further evaluation and management of urinary retention (including cystoscopy/voiding trial). Repeat LFTs to follow transaminitis. Outpatient follow-up with psychiatry service as instructed. Outpatient follow-up with infectious disease to further assist with management of chronic hep B.  Discharge Diagnoses: Active Problems:   Depression, major, recurrent, severe with psychosis ([place])   Controlled type 2 diabetes mellitus without complication, with long-term current use of insulin ([facility])   Elevated LFTs   Chronic Hepatitis B   Acute cystitis without hematuria   Intractable nausea and vomiting  Principal Problem (Resolved):   UTI (urinary tract infection)  Hospital Course: As per H&P written by Dr. Nehemiah Settle on 03/03/2022 Garrett [name] is a 36 y.o. 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  History is obtained with use of interpreter.  Patient is a ward of the state and lives in a group home.  He was sent to the hospital due to nausea and vomiting.  He was diagnosed with a UTI about 5 days ago and was prescribed an antibiotic.  This antibiotic was changed to ciprofloxacin because the urine culture grew out Pseudomonas.  Unfortunately, every time he takes his antibiotic, he is unable to keep anything down and vomits.  His appetite has been poor.  He reports no fevers or chills.  He is come to the hospital for evaluation.   When he was evaluated  prior, he was having a difficult time urinating.  A Foley catheter was placed, which has been indwelling.  Assessment and Plan: UTI -Recent diagnosis of Pseudomonas UTI as an outpatient for which she was placed on oral ciprofloxacin which led to nausea. -Initially started on IV Levaquin but because of persistent nausea and vomiting, antibiotics switched to IV meropenem.  Today is only day 5 of meropenem, urine culture on admission has been negative so far. -Planning treatment with one-time fosfomycin on 03/10/2022 to consolidate therapy. -Continue to maintain adequate hydration.   Nausea -Initially thought to be medication related: Currently on round-the-clock Zofran and as needed Reglan. -Patient also empirically started on IV Diflucan on 03/06/2022 with concerns for oropharyngeal candidiasis. -There might be a behavior component as well as patient is mostly holding his saliva in his mouth and spits it on the floor as per nursing staff -Continue the use of Diflucan and planning to transition to oral route at discharge to complete therapy. -Nursing staff reporting improvement in the spitting/vomiting. -Currently tolerating diet.   Urinary retention -Patient had recent urinary retention requiring Foley catheter placement which was removed as an outpatient by urology.  He had Foley catheter placement again which was removed in the ED on presentation.  Continue Flomax.   -Continue to have issues with urinary retention requiring in and out catheterization few times and subsequently Foley catheter was placed on 03/04/2022.   -Outpatient follow-up with urology will be needed.   Elevated LFTs -History of chronic hep B -Patient has chronically elevated LFTs.  Right upper quadrant ultrasound showed mild gallbladder distention with biliary sludge but no signs of cholecystitis.   -  LFTs slightly worsened today: Will repeat a.m. LFTs since the patient is currently now on Diflucan as well.   -Outpatient  follow-up with ID   Hypokalemia -Improved/resolved. -Continue to follow electrolytes trend.   Hyponatremia/hypokalemia -Improved/resolved. -All electrolytes stable currently.   Thrombocytopenia -Questionable cause, presumed to be associated with infection.  Resolved. -Repeat CBC at follow-up visit to assess stability/trend.   Diabetes mellitus type 2 with hypoglycemia -Resume adjusted dosage of hypoglycemic agents to minimize episodes of low blood sugar. -Continue to monitor 10 adequate hydration and follow close monitoring of patient's CBGs.   Dysphagia -Resume likely associated with oral thrush.   -Diet as per SLP recommendations. -Dysphagia 3 diet with thin liquids recommended at discharge.   History of psychosis and depression -Continue benztropine, escitalopram, gabapentin, paliperidone, and Larach panel. -Appreciate consultation by psychiatry/TTS; currently clear for discharge and outpatient psychiatry follow-up. -Flat affect appreciated on exam.  No suicidal ideation.   Physical deconditioning -Patient has been evaluated by physical therapy with recommendation for home health PT at discharge. -Minimal intermittent supervision/assistance appreciated on assessment. -Given lack of agencies to work with his insurance company patient has been referred for outpatient physical therapy.  Consultants: Psychiatry service. Procedures performed: See below for x-ray reports. Disposition: Assisted living with outpatient physical therapy. Diet recommendation: Dysphagia 3 diet with thin liquids; watch sugar intake.   DISCHARGE MEDICATION: Allergies as of 03/10/2022   No Known Allergies      Medication List     STOP taking these medications    cephALEXin 500 MG capsule Commonly known as: KEFLEX   ciprofloxacin 500 MG tablet Commonly known as: CIPRO   glipiZIDE 5 MG tablet Commonly known as: GLUCOTROL   linagliptin 5 MG Tabs tablet Commonly known as: TRADJENTA    ondansetron 4 MG tablet Commonly known as: ZOFRAN       TAKE these medications    Accu-Chek Guide test strip Generic drug: glucose blood Use to check blood sugars up to 4 times daily as directed.   Accu-Chek Guide w/Device Kit Use to test blood sugars up to 4 times daily as directed.   Accu-Chek Softclix Lancets lancets Use to test blood sugars up to 4 times daily as directed.   acetaminophen 325 MG tablet Commonly known as: TYLENOL Take 2 tablets (650 mg total) by mouth every 6 (six) hours as needed for mild pain or moderate pain.   Anti-Diarrheal 2 MG tablet Generic drug: loperamide Take by mouth.   benztropine 0.5 MG tablet Commonly known as: COGENTIN Take 1 tablet (0.5 mg total) by mouth 2 (two) times daily.   Dexcom G6 Receiver Devi 1 Device by Does not apply route daily.   Dexcom G6 Sensor Misc 1 packet by Does not apply route daily.   escitalopram 10 MG tablet Commonly known as: LEXAPRO Take 1 tablet (10 mg total) by mouth daily.   feeding supplement (GLUCERNA SHAKE) Liqd Take 237 mLs by mouth 3 (three) times daily between meals.   fluconazole 100 MG tablet Commonly known as: DIFLUCAN Take 1 tablet (100 mg total) by mouth daily for 4 days. Start taking on: March 11, 2022   gabapentin 100 MG capsule Commonly known as: NEURONTIN Take 1 capsule (100 mg total) by mouth 2 (two) times daily.   insulin glargine 100 UNIT/ML Solostar Pen Commonly known as: LANTUS Inject 10 Units into the skin daily.   LORazepam 0.5 MG tablet Commonly known as: ATIVAN Take 1 tablet (0.5 mg total) by mouth at bedtime.   miconazole  nitrate Powd Commonly known as: MICATIN Apply 1 application. topically 3 (three) times daily.   midodrine 10 MG tablet Commonly known as: PROAMATINE Take 1 tablet (10 mg total) by mouth 3 (three) times daily with meals.   NovoLOG FlexPen 100 UNIT/ML FlexPen Generic drug: insulin aspart Inject 0-6 Units into the skin 3 (three) times daily  with meals. What changed: Another medication with the same name was removed. Continue taking this medication, and follow the directions you see here.   ondansetron 4 MG disintegrating tablet Commonly known as: ZOFRAN-ODT Take 1 tablet (4 mg total) by mouth every 8 (eight) hours as needed for up to 15 doses for nausea or vomiting.   paliperidone 3 MG 24 hr tablet Commonly known as: INVEGA Take 1 tablet (3 mg total) by mouth at bedtime.   Pentips 32G X 4 MM Misc Generic drug: Insulin Pen Needle Use to inject insulin up to 4 times daily.   promethazine 25 MG suppository Commonly known as: PHENERGAN Place 1 suppository (25 mg total) rectally every 8 (eight) hours as needed for refractory nausea / vomiting. What changed:  when to take this reasons to take this   tamsulosin 0.4 MG Caps capsule Commonly known as: FLOMAX Take 1 capsule (0.4 mg total) by mouth daily.               Durable Medical Equipment  (From admission, onward)           Start     Ordered   03/05/22 1308  For home use only DME Walker rolling  Once       Comments: Patient has to lean on nearby objects for support when taking steps without AD, required use of RW for safety with good return for use demonstrated.  Question Answer Comment  Walker: With [place]   Patient needs a walker to treat with the following condition Gait difficulty      03/05/22 1309            Discharge Exam: Filed Weights   03/03/22 1540  Weight: 49 kg   General exam: Alert, awake, oriented x 3; reported having some loose stools but overall feeling better.  No fever, slightly nauseated but no further vomiting.  Reports improvement in his diet tolerance.  Oral examination demonstrating significant improvement in thrush. Respiratory system: Clear to auscultation. Respiratory effort normal.  Good saturation on room air. Cardiovascular system:RRR. No rubs or gallops; no JVD. Gastrointestinal system: Abdomen is  nondistended, soft and overall nontender to palpation.  Positive bowel sounds. Central nervous system: Alert and oriented. No focal neurological deficits. Extremities: No cyanosis, clubbing or edema. Skin: No petechiae. Psychiatry: Judgement and insight appear stable; no agitation or hallucinations currently.  Condition at discharge: Stable and improved.  The results of significant diagnostics from this hospitalization (including imaging, microbiology, ancillary and laboratory) are listed below for reference.   Imaging Studies: US Abdomen Limited RUQ (LIVER/GB)  Result Date: 03/04/2022 CLINICAL DATA:  36 year old male with history of transaminitis. EXAM: ULTRASOUND ABDOMEN LIMITED RIGHT UPPER QUADRANT COMPARISON:  None Available. FINDINGS: Gallbladder: Mildly echogenic material visualized throughout the gallbladder which is mildly distended. No gallbladder wall thickening or pericholecystic fluid. No definite sonographic Murphy sign. Common bile duct: Diameter: 4 mm Liver: No focal lesion identified. Within normal limits in parenchymal echogenicity and echotexture. Portal vein is patent on color Doppler imaging with normal direction of blood flow towards the liver. Other: No perihepatic ascites. IMPRESSION: 1. Mild gallbladder distension with biliary  sludge. No sonographic evidence of acute cholecystitis. 2. Normal sonographic appearance of the hepatic parenchyma. Ruthann Cancer, MD [facility] Electronically Signed   By: Ruthann Cancer M.D.   On: 03/04/2022 09:35   DG Chest Port 1 View  Result Date: 03/03/2022 CLINICAL DATA:  Confusion. EXAM: PORTABLE CHEST 1 VIEW COMPARISON:  Radiographs 01/18/2022 and 01/14/2022. FINDINGS: 1651 hours. Enteric tube and right IJ central venous catheter have been removed in the interval. There is improved aeration of the lung bases which are clear. The heart size and mediastinal contours are normal. No acute  osseous findings are evident. Telemetry leads overlie the chest. IMPRESSION: No evidence of acute cardiopulmonary process. Interval improved aeration of the lung bases. Electronically Signed   By: Richardean Sale M.D.   On: 03/03/2022 17:11   CT ABDOMEN PELVIS W CONTRAST  Result Date: 02/27/2022 CLINICAL DATA:  Pyelonephritis (Ped 0-17y) Persistent UTI now with nausea/vomiting - evaluation for pyelo EXAM: CT ABDOMEN AND PELVIS WITH CONTRAST TECHNIQUE: Multidetector CT imaging of the abdomen and pelvis was performed using the standard protocol following bolus administration of intravenous contrast. RADIATION DOSE REDUCTION: This exam was performed according to the departmental dose-optimization program which includes automated exposure control, adjustment of the mA and/or kV according to patient size and/or use of iterative reconstruction technique. CONTRAST:  12m OMNIPAQUE IOHEXOL 300 MG/ML  SOLN COMPARISON:  CT abdomen pelvis 02/25/2022, CT abdomen pelvis 05/05/2021 FINDINGS: Slightly limited evaluation due to respiratory motion artifact. Lower chest: No acute abnormality. Hepatobiliary: No focal liver abnormality. No gallstones, gallbladder wall thickening, or pericholecystic fluid. No biliary dilatation. Pancreas: No focal lesion. Normal pancreatic contour. No surrounding inflammatory changes. No main pancreatic ductal dilatation. Spleen: Normal in size without focal abnormality. Adrenals/Urinary Tract: No adrenal nodule bilaterally. Bilateral kidneys enhance symmetrically.  No abscess formation. No hydronephrosis. No hydroureter.  No nephroureterolithiasis. Circumferential urinary bladder wall thickening mucosal hyperemia. The urinary bladder is mildly distended with urine. Foley catheter tip and balloon terminate within the urinary bladder lumen. Air-fluid level noted likely due to Foley catheter insertion. Stomach/Bowel: Stomach is within normal limits. No evidence of bowel wall thickening or dilatation.  Status post appendectomy. Vascular/Lymphatic: No abdominal aorta or iliac aneurysm. Mild atherosclerotic plaque of the aorta and its branches. No abdominal, pelvic, or inguinal lymphadenopathy. Reproductive: Prostate is unremarkable. Other: No intraperitoneal free fluid. No intraperitoneal free gas. No organized fluid collection. Musculoskeletal: No abdominal wall hernia or abnormality. No suspicious lytic or blastic osseous lesions. No acute displaced fracture. IMPRESSION: 1. Cystitis.  No definite CT evidence of pyelonephritis. 2.  Aortic Atherosclerosis (ICD10-I70.0). Electronically Signed   By: MIven FinnM.D.   On: 02/27/2022 21:23   CT ABDOMEN PELVIS W CONTRAST  Result Date: 02/25/2022 CLINICAL DATA:  Bowel obstruction suspected EXAM: CT ABDOMEN AND PELVIS WITH CONTRAST TECHNIQUE: Multidetector CT imaging of the abdomen and pelvis was performed using the standard protocol following bolus administration of intravenous contrast. RADIATION DOSE REDUCTION: This exam was performed according to the departmental dose-optimization program which includes automated exposure control, adjustment of the mA and/or kV according to patient size and/or use of iterative reconstruction technique. CONTRAST:  827mOMNIPAQUE IOHEXOL 300 MG/ML  SOLN COMPARISON:  CT pelvis 06/03/2021, CT abdomen pelvis 03/09/2020 FINDINGS: Lower chest: No acute abnormality. Hepatobiliary: No focal liver abnormality. No gallstones, gallbladder wall thickening, or pericholecystic fluid. No biliary dilatation. Pancreas: No focal lesion. Normal pancreatic contour. No surrounding inflammatory changes. No main pancreatic ductal dilatation. Spleen: Normal in size  without focal abnormality. Adrenals/Urinary Tract: No adrenal nodule bilaterally. Bilateral kidneys enhance symmetrically. No hydronephrosis. No hydroureter. Slight hyperemia of the urinary bladder mucosa with question slight urinary bladder wall thickening. Gas noted within the urinary  bladder lumen. Stomach/Bowel: Stomach is within normal limits. No evidence of bowel wall thickening or dilatation. Redundant sigmoid colon. Status post appendectomy. Vascular/Lymphatic: No abdominal aorta or iliac aneurysm. Moderate to severe noncalcified and calcified atherosclerotic plaque of the aorta and its branches. No abdominal, pelvic, or inguinal lymphadenopathy. Reproductive: Prostate is unremarkable. Other: No intraperitoneal free fluid. No intraperitoneal free gas. No organized fluid collection. Musculoskeletal: No abdominal wall hernia or abnormality.  Bilateral gynecomastia. No suspicious lytic or blastic osseous lesions. No acute displaced fracture. Multilevel degenerative changes of the spine. IMPRESSION: 1. No findings of bowel obstruction. 2. Slight prominence of the urinary bladder wall and mucosal hyperemia with associated gas within the urinary bladder lumen. Recommend correlation with urinalysis for infection. 3.  Aortic Atherosclerosis (ICD10-I70.0). Electronically Signed   By: Iven Finn M.D.   On: 02/25/2022 22:17    Microbiology: Results for orders placed or performed during the hospital encounter of 03/03/22  Urine Culture     Status: None   Collection Time: 03/04/22  1:24 PM   Specimen: Urine, Clean Catch  Result Value Ref Range Status   Specimen Description   Final    URINE, CLEAN CATCH Performed at [facility], [address], [place], [place] [address]    Special Requests   Final    NONE Performed at [facility], [address], [place], [place] [address]    Culture   Final    NO GROWTH Performed at [facility], [place] [address], [place], [place] [address]    Report Status 03/05/2022 FINAL  Final  Gastrointestinal Panel by PCR , Stool     Status: None   Collection Time: 03/09/22  4:26 AM   Specimen: Stool  Result Value Ref Range Status   Campylobacter species NOT DETECTED NOT DETECTED Final   Plesimonas shigelloides NOT DETECTED NOT DETECTED Final    Salmonella species NOT DETECTED NOT DETECTED Final   Yersinia enterocolitica NOT DETECTED NOT DETECTED Final   Vibrio species NOT DETECTED NOT DETECTED Final   Vibrio cholerae NOT DETECTED NOT DETECTED Final   Enteroaggregative E coli (EAEC) NOT DETECTED NOT DETECTED Final   Enteropathogenic E coli (EPEC) NOT DETECTED NOT DETECTED Final   Enterotoxigenic E coli (ETEC) NOT DETECTED NOT DETECTED Final   Shiga like toxin producing E coli (STEC) NOT DETECTED NOT DETECTED Final   Shigella/Enteroinvasive E coli (EIEC) NOT DETECTED NOT DETECTED Final   Cryptosporidium NOT DETECTED NOT DETECTED Final   Cyclospora cayetanensis NOT DETECTED NOT DETECTED Final   Entamoeba histolytica NOT DETECTED NOT DETECTED Final   Giardia lamblia NOT DETECTED NOT DETECTED Final   Adenovirus F40/41 NOT DETECTED NOT DETECTED Final   Astrovirus NOT DETECTED NOT DETECTED Final   Norovirus GI/GII NOT DETECTED NOT DETECTED Final   Rotavirus A NOT DETECTED NOT DETECTED Final   Sapovirus (I, II, IV, and V) NOT DETECTED NOT DETECTED Final    Comment: Performed at [facility], [address] [place], [place] [address]    Labs: CBC: Recent Labs  Lab 03/04/22 0413 03/05/22 0600 03/06/22 0532 03/07/22 0545 03/08/22 0719  WBC 6.1 5.1 4.7 5.1 4.4  NEUTROABS  --  3.1 1.8 1.8 1.6*  HGB 10.8* 11.2* 11.5* 11.5* 11.4*  HCT 33.1* 33.7* 33.7* 34.2* 34.2*  MCV 95.7 91.3 89.9 92.7 93.4  PLT 122* 127* 133* 150 102*   Basic Metabolic Panel: Recent Labs  Lab 03/05/22 0600 03/06/22 0532 03/07/22 0545 03/08/22 0459 03/09/22 0602  NA 132* 133* 136 136 133*  K 2.6* 3.4* 3.9 4.0 4.5  CL 98 102 105 106 109  CO2 _0 GLUCOSE 131* 92 132* 83 171*  BUN 6 <5* <5* 5* 7  CREATININE 0.62 0.57* 0.68 0.78 0.67  CALCIUM 7.5* 7.9* 7.8* 7.9* 8.0*  MG 1.8 1.8 2.1 2.0  --    Liver Function Tests: Recent Labs  Lab 03/04/22 0413 03/05/22 0600 03/06/22 0532 03/07/22 0545 03/08/22 0459  AST 101* 100* 111*  153* 158*  ALT 109* 101* 105* 136* 140*  ALKPHOS 40 39 42 46 43  BILITOT 1.4* 2.0* 1.7* 1.8* 1.1  PROT 6.8 6.7 6.9 7.0 6.5  ALBUMIN 2.6* 2.5* 2.7* 2.7* 2.5*   CBG: Recent Labs  Lab 03/08/22 1625 03/09/22 0716 03/09/22 1107 03/09/22 1621 03/09/22 2059  GLUCAP 105* 160* 142* 127* 135*    Discharge time spent: greater than 30 minutes.  Signed: Barton Dubois, MD [facility] 03/10/2022

## 2022-03-10 NOTE — Progress Notes (Signed)
Nsg Discharge Note  Admit Date:  03/03/2022 Discharge date: 03/10/2022   [facility] [name] to be D/C'd Home per MD order.  AVS completed.   Patient/caregiver able to verbalize understanding.  Discharge Medication: Allergies as of 03/10/2022   No Known Allergies      Medication List     STOP taking these medications    cephALEXin 500 MG capsule Commonly known as: KEFLEX   ciprofloxacin 500 MG tablet Commonly known as: CIPRO   glipiZIDE 5 MG tablet Commonly known as: GLUCOTROL   linagliptin 5 MG Tabs tablet Commonly known as: TRADJENTA   ondansetron 4 MG tablet Commonly known as: ZOFRAN       TAKE these medications    Accu-Chek Guide test strip Generic drug: glucose blood Use to check blood sugars up to 4 times daily as directed.   Accu-Chek Guide w/Device Kit Use to test blood sugars up to 4 times daily as directed.   Accu-Chek Softclix Lancets lancets Use to test blood sugars up to 4 times daily as directed.   acetaminophen 325 MG tablet Commonly known as: TYLENOL Take 2 tablets (650 mg total) by mouth every 6 (six) hours as needed for mild pain or moderate pain.   Anti-Diarrheal 2 MG tablet Generic drug: loperamide Take by mouth.   benztropine 0.5 MG tablet Commonly known as: COGENTIN Take 1 tablet (0.5 mg total) by mouth 2 (two) times daily.   Dexcom G6 Receiver Devi 1 Device by Does not apply route daily.   Dexcom G6 Sensor Misc 1 packet by Does not apply route daily.   escitalopram 10 MG tablet Commonly known as: LEXAPRO Take 1 tablet (10 mg total) by mouth daily.   feeding supplement (GLUCERNA SHAKE) Liqd Take 237 mLs by mouth 3 (three) times daily between meals.   fluconazole 100 MG tablet Commonly known as: DIFLUCAN Take 1 tablet (100 mg total) by mouth daily for 4 days. Start taking on: March 11, 2022   gabapentin 100 MG capsule Commonly known as: NEURONTIN Take 1 capsule (100 mg total) by mouth 2 (two) times daily.   insulin glargine  100 UNIT/ML Solostar Pen Commonly known as: LANTUS Inject 10 Units into the skin daily.   LORazepam 0.5 MG tablet Commonly known as: ATIVAN Take 1 tablet (0.5 mg total) by mouth at bedtime.   miconazole nitrate Powd Commonly known as: MICATIN Apply 1 application. topically 3 (three) times daily.   midodrine 10 MG tablet Commonly known as: PROAMATINE Take 1 tablet (10 mg total) by mouth 3 (three) times daily with meals.   NovoLOG FlexPen 100 UNIT/ML FlexPen Generic drug: insulin aspart Inject 0-6 Units into the skin 3 (three) times daily with meals. What changed: Another medication with the same name was removed. Continue taking this medication, and follow the directions you see here.   ondansetron 4 MG disintegrating tablet Commonly known as: ZOFRAN-ODT Take 1 tablet (4 mg total) by mouth every 8 (eight) hours as needed for up to 15 doses for nausea or vomiting.   paliperidone 3 MG 24 hr tablet Commonly known as: INVEGA Take 1 tablet (3 mg total) by mouth at bedtime.   Pentips 32G X 4 MM Misc Generic drug: Insulin Pen Needle Use to inject insulin up to 4 times daily.   promethazine 25 MG suppository Commonly known as: PHENERGAN Place 1 suppository (25 mg total) rectally every 8 (eight) hours as needed for refractory nausea / vomiting. What changed:  when to take this reasons to take this  tamsulosin 0.4 MG Caps capsule Commonly known as: FLOMAX Take 1 capsule (0.4 mg total) by mouth daily.               Durable Medical Equipment  (From admission, onward)           Start     Ordered   03/05/22 1308  For home use only DME Walker rolling  Once       Comments: Patient has to lean on nearby objects for support when taking steps without AD, required use of RW for safety with good return for use demonstrated.  Question Answer Comment  Walker: With [place]   Patient needs a walker to treat with the following condition Gait difficulty      03/05/22 1309             Discharge Assessment: Vitals:   03/10/22 0539 03/10/22 1421  BP: (!) 88/52 95/66  Pulse: 98 87  Resp: 18 16  Temp: 98.4 F (36.9 C) 98.3 F (36.8 C)  SpO2: 100% 99%   Skin clean, dry and intact without evidence of skin break down, no evidence of skin tears noted. IV catheter discontinued intact. Site without signs and symptoms of complications - no redness or edema noted at insertion site, patient denies c/o pain - only slight tenderness at site.  Dressing with slight pressure applied.  D/c Instructions-Education: Discharge instructions given to patient/family with verbalized understanding. D/c education completed with patient/family including follow up instructions, medication list, d/c activities limitations if indicated, with other d/c instructions as indicated by MD - patient able to verbalize understanding, all questions fully answered. Patient instructed to return to ED, call 911, or call MD for any changes in condition.  Patient escorted via [place], and D/C home via private auto.  Kathie Rhodes, RN 03/10/2022 3:08 PM

## 2022-03-23 ENCOUNTER — Emergency Department (HOSPITAL_COMMUNITY)
Admission: EM | Admit: 2022-03-23 | Discharge: 2022-03-24 | Disposition: A | Discharge status: Home / Self Care | Payer: Medicaid Other | Attending: Emergency Medicine | Admitting: Emergency Medicine

## 2022-03-23 ENCOUNTER — Other Ambulatory Visit: Payer: Self-pay

## 2022-03-23 ENCOUNTER — Ambulatory Visit: Payer: Medicaid Other | Admitting: Urology

## 2022-03-23 ENCOUNTER — Encounter (HOSPITAL_COMMUNITY): Payer: Self-pay | Admitting: *Deleted

## 2022-03-23 DIAGNOSIS — E119 Type 2 diabetes mellitus without complications: Secondary | ICD-10-CM | POA: Diagnosis not present

## 2022-03-23 DIAGNOSIS — R319 Hematuria, unspecified: Secondary | ICD-10-CM | POA: Diagnosis present

## 2022-03-23 DIAGNOSIS — Z7984 Long term (current) use of oral hypoglycemic drugs: Secondary | ICD-10-CM | POA: Insufficient documentation

## 2022-03-23 DIAGNOSIS — R310 Gross hematuria: Secondary | ICD-10-CM

## 2022-03-23 DIAGNOSIS — Z794 Long term (current) use of insulin: Secondary | ICD-10-CM | POA: Diagnosis not present

## 2022-03-23 DIAGNOSIS — Z960 Presence of urogenital implants: Secondary | ICD-10-CM | POA: Insufficient documentation

## 2022-03-23 DIAGNOSIS — Z978 Presence of other specified devices: Secondary | ICD-10-CM

## 2022-03-23 LAB — CBC WITH DIFFERENTIAL/PLATELET
Abs Immature Granulocytes: 0.02 10*3/uL (ref 0.00–0.07)
Basophils Absolute: 0 10*3/uL (ref 0.0–0.1)
Basophils Relative: 1 %
Eosinophils Absolute: 0.2 10*3/uL (ref 0.0–0.5)
Eosinophils Relative: 4 %
HCT: 40.3 % (ref 39.0–52.0)
Hemoglobin: 13.2 g/dL (ref 13.0–17.0)
Immature Granulocytes: 0 %
Lymphocytes Relative: 31 %
Lymphs Abs: 2 10*3/uL (ref 0.7–4.0)
MCH: 31.1 pg (ref 26.0–34.0)
MCHC: 32.8 g/dL (ref 30.0–36.0)
MCV: 94.8 fL (ref 80.0–100.0)
Monocytes Absolute: 0.5 10*3/uL (ref 0.1–1.0)
Monocytes Relative: 9 %
Neutro Abs: 3.4 10*3/uL (ref 1.7–7.7)
Neutrophils Relative %: 55 %
Platelets: 234 10*3/uL (ref 150–400)
RBC: 4.25 MIL/uL (ref 4.22–5.81)
RDW: 13.4 % (ref 11.5–15.5)
WBC: 6.2 10*3/uL (ref 4.0–10.5)
nRBC: 0 % (ref 0.0–0.2)

## 2022-03-23 LAB — COMPREHENSIVE METABOLIC PANEL
ALT: 95 U/L — ABNORMAL HIGH (ref 0–44)
AST: 114 U/L — ABNORMAL HIGH (ref 15–41)
Albumin: 3.3 g/dL — ABNORMAL LOW (ref 3.5–5.0)
Alkaline Phosphatase: 61 U/L (ref 38–126)
Anion gap: 8 (ref 5–15)
BUN: 13 mg/dL (ref 6–20)
CO2: 29 mmol/L (ref 22–32)
Calcium: 8.3 mg/dL — ABNORMAL LOW (ref 8.9–10.3)
Chloride: 96 mmol/L — ABNORMAL LOW (ref 98–111)
Creatinine, Ser: 0.72 mg/dL (ref 0.61–1.24)
GFR, Estimated: >60 mL/min (ref >60)
Glucose, Bld: 183 mg/dL — ABNORMAL HIGH (ref 70–99)
Potassium: 4 mmol/L (ref 3.5–5.1)
Sodium: 133 mmol/L — ABNORMAL LOW (ref 135–145)
Total Bilirubin: 1.6 mg/dL — ABNORMAL HIGH (ref 0.3–1.2)
Total Protein: 8.8 g/dL — ABNORMAL HIGH (ref 6.5–8.1)

## 2022-03-23 LAB — URINALYSIS, ROUTINE W REFLEX MICROSCOPIC
Bilirubin Urine: NEGATIVE
Glucose, UA: NEGATIVE mg/dL
Ketones, ur: NEGATIVE mg/dL
Leukocytes,Ua: NEGATIVE
Nitrite: NEGATIVE
Protein, ur: NEGATIVE mg/dL
Specific Gravity, Urine: 1.014 (ref 1.005–1.030)
pH: 6 (ref 5.0–8.0)

## 2022-03-23 MED ORDER — SODIUM CHLORIDE 0.9 % IV BOLUS
1000.0000 mL | Freq: Once | INTRAVENOUS | Status: AC
  Administered 2022-03-23: 1000 mL via INTRAVENOUS

## 2022-03-23 NOTE — ED Triage Notes (Addendum)
Pt brought in by [facility] from [facility] with c/o hematuria that started after lunch today according to the pt's facility staff. Pt reports it has been going on for "awhile now". Pt also c/o "a little" pain in his bladder and describes it as shooting and achy.    Interpreter Used [employer] - Interpreter # [phone]

## 2022-03-23 NOTE — ED Provider Notes (Addendum)
[facility] EMERGENCY DEPARTMENT Provider Note   CSN: [id number] Arrival date & time: 03/23/22  1701     History  Chief Complaint  Patient presents with   Hematuria    Garrett [name] is a 36 y.o. male.  Pt is a 36 yo male with a pmhx significant for dm, hep B, ddd with psychosis, malnutrition, and frequent UTIs .  Pt is a ward of the state and is from a group home.  Pt was admitted from 12/1-8 for a pseudomonas UTI.  He was d/c with a foley due to urinary retention.  He is back today with  hematuria.  Staff noticed it after lunch today, but pt said it's been going on "a little while."  Pt does have pain in his lower abdomen.          Home Medications Prior to Admission medications   Medication Sig Start Date End Date Taking? Authorizing Provider  acetaminophen (TYLENOL) 325 MG tablet Take 2 tablets (650 mg total) by mouth every 6 (six) hours as needed for mild pain or moderate pain. 07/11/21  Yes Samella Parr, NP  ANTI-DIARRHEAL 2 MG tablet Take by mouth. 02/22/22  Yes [provider]  benztropine (COGENTIN) 0.5 MG tablet Take 1 tablet (0.5 mg total) by mouth 2 (two) times daily. 07/11/21  Yes Samella Parr, NP  escitalopram (LEXAPRO) 10 MG tablet Take 1 tablet (10 mg total) by mouth daily. 07/12/21  Yes Samella Parr, NP  gabapentin (NEURONTIN) 100 MG capsule Take 1 capsule (100 mg total) by mouth 2 (two) times daily. 07/11/21  Yes Samella Parr, NP  glipiZIDE (GLUCOTROL) 5 MG tablet Take 5 mg by mouth daily before breakfast.   Yes [provider]  insulin glargine (LANTUS) 100 UNIT/ML Solostar Pen Inject 10 Units into the skin daily. 03/10/22  Yes Barton Dubois, MD  midodrine (PROAMATINE) 10 MG tablet Take 1 tablet (10 mg total) by mouth 3 (three) times daily with meals. 03/10/22  Yes Barton Dubois, MD  ondansetron (ZOFRAN-ODT) 4 MG disintegrating tablet Take 1 tablet (4 mg total) by mouth every 8 (eight) hours as needed for up to 15 doses for nausea or  vomiting. 02/27/22  Yes Trifan, Carola Rhine, MD  promethazine (PHENERGAN) 25 MG suppository Place 1 suppository (25 mg total) rectally every 8 (eight) hours as needed for refractory nausea / vomiting. 03/10/22  Yes Barton Dubois, MD  tamsulosin (FLOMAX) 0.4 MG CAPS capsule Take 1 capsule (0.4 mg total) by mouth daily. 02/09/22  Yes Irine Seal, MD  Accu-Chek Softclix Lancets lancets Use to test blood sugars up to 4 times daily as directed. 07/11/21   Samella Parr, NP  Blood Glucose Monitoring Suppl (BLOOD GLUCOSE MONITOR SYSTEM) w/Device KIT Use to test blood sugars up to 4 times daily as directed. 07/11/21   Samella Parr, NP  feeding supplement, GLUCERNA SHAKE, (GLUCERNA SHAKE) LIQD Take 237 mLs by mouth 3 (three) times daily between meals. Patient not taking: Reported on 03/23/2022 07/11/21   Samella Parr, NP  glucose blood (ACCU-CHEK GUIDE) test strip Use to check blood sugars up to 4 times daily as directed. 07/11/21   Samella Parr, NP  insulin aspart (NOVOLOG) 100 UNIT/ML FlexPen Inject 0-6 Units into the skin 3 (three) times daily with meals. Patient not taking: Reported on 03/23/2022 07/11/21   Samella Parr, NP  Insulin Pen Needle 32G X 4 MM MISC Use to inject insulin up to 4 times daily. 07/11/21  Samella Parr, NP  LORazepam (ATIVAN) 0.5 MG tablet Take 1 tablet (0.5 mg total) by mouth at bedtime. Patient not taking: Reported on 03/23/2022 07/11/21   Samella Parr, NP  miconazole nitrate (MICATIN) POWD Apply 1 application. topically 3 (three) times daily. Patient not taking: Reported on 03/23/2022 07/11/21   Samella Parr, NP      Allergies    Patient has no known allergies.    Review of Systems   Review of Systems  Genitourinary:  Positive for dysuria and hematuria.  All other systems reviewed and are negative.   Physical Exam Updated Vital Signs BP (!) 128/97 (BP Location: Right Leg)   Pulse 76   Temp 98 F (36.7 C) (Oral)   Resp 11   Ht _0  (1.626 m)    Wt 49 kg   SpO2 100%   BMI 18.54 kg/m  Physical Exam Vitals and nursing note reviewed.  Constitutional:      Appearance: Normal appearance.  HENT:     Head: Normocephalic and atraumatic.     Right Ear: External ear normal.     Left Ear: External ear normal.     Nose: Nose normal.     Mouth/Throat:     Mouth: Mucous membranes are dry.  Eyes:     Extraocular Movements: Extraocular movements intact.     Conjunctiva/sclera: Conjunctivae normal.     Pupils: Pupils are equal, round, and reactive to light.  Cardiovascular:     Rate and Rhythm: Normal rate and regular rhythm.     Pulses: Normal pulses.     Heart sounds: Normal heart sounds.  Pulmonary:     Effort: Pulmonary effort is normal.     Breath sounds: Normal breath sounds.  Abdominal:     General: Abdomen is flat. Bowel sounds are normal.     Palpations: Abdomen is soft.     Tenderness: There is abdominal tenderness in the suprapubic area.  Genitourinary:    Comments: Indwelling foley Musculoskeletal:        General: Normal range of motion.     Cervical back: Normal range of motion and neck supple.  Skin:    General: Skin is warm.     Capillary Refill: Capillary refill takes less than 2 seconds.  Neurological:     General: No focal deficit present.     Mental Status: He is alert and oriented to person, place, and time.  Psychiatric:        Mood and Affect: Mood normal.        Behavior: Behavior normal.     ED Results / Procedures / Treatments   Labs (all labs ordered are listed, but only abnormal results are displayed) Labs Reviewed  COMPREHENSIVE METABOLIC PANEL - Abnormal; Notable for the following components:      Result Value   Sodium 133 (*)    Chloride 96 (*)    Glucose, Bld 183 (*)    Calcium 8.3 (*)    Total Protein 8.8 (*)    Albumin 3.3 (*)    AST 114 (*)    ALT 95 (*)    Total Bilirubin 1.6 (*)    All other components within normal limits  URINALYSIS, ROUTINE W REFLEX MICROSCOPIC -  Abnormal; Notable for the following components:   Hgb urine dipstick MODERATE (*)    Bacteria, UA RARE (*)    All other components within normal limits  URINE CULTURE  CBC WITH DIFFERENTIAL/PLATELET    EKG EKG  Interpretation  Date/Time:  Thursday March 23 2022 18:56:04 EST Ventricular Rate:  77 PR Interval:  163 QRS Duration: 83 QT Interval:  431 QTC Calculation: 488 R Axis:   74 Text Interpretation: Sinus rhythm Borderline prolonged QT interval No significant change since last tracing Confirmed by Isla Pence [phone]) on 03/23/2022 6:58:51 PM  Radiology No results found.  Procedures Procedures    Medications Ordered in ED Medications  sodium chloride 0.9 % bolus 1,000 mL (0 mLs Intravenous Stopped 03/23/22 2147)  sodium chloride 0.9 % bolus 1,000 mL (1,000 mLs Intravenous New Bag/Given 03/23/22 2146)    ED Course/ Medical Decision Making/ A&P                           Medical Decision Making Amount and/or Complexity of Data Reviewed Labs: ordered.   This patient presents to the ED for concern of hematuria, this involves an extensive number of treatment options, and is a complaint that carries with it a high risk of complications and morbidity.  The differential diagnosis includes uti, sepsis, kidney stone   Co morbidities that complicate the patient evaluation  dm, hep B, ddd with psychosis, malnutrition, and frequent UTIs   Additional history obtained:  Additional history obtained from epic chart review External records from outside source obtained and reviewed including EMS report   Lab Tests:  I Ordered, and personally interpreted labs.  The pertinent results include:  cbc nl, cmp with glucose elevated at 183.  LFTs slightly elevated chronically; urine shows mod hgb; 11-20 rbcs and 0-5 wbcs.  Cardiac Monitoring:  The patient was maintained on a cardiac monitor.  I personally viewed and interpreted the cardiac monitored which showed an underlying  rhythm of: nsr   Medicines ordered and prescription drug management:   I have reviewed the patients home medicines and have made adjustments as needed  Problem List / ED Course:  Hematuria:  Hematuria has now mostly resolved after fluids.  Urine does not show signs of infection.  He is stable for d/c.  He needs to f/u with urology.  Reevaluation:  After the interventions noted above, I reevaluated the patient and found that they have :improved   Social Determinants of Health:  Lives at a group home   Dispostion:  After consideration of the diagnostic results and the patients response to treatment, I feel that the patent would benefit from discharge with outpatient f/u.      Final Clinical Impression(s) / ED Diagnoses Final diagnoses:  Indwelling Foley catheter present  Gross hematuria    Rx / DC Orders ED Discharge Orders     None         Isla Pence, MD 03/23/22 6301    Isla Pence, MD 03/23/22 2344

## 2022-03-25 LAB — URINE CULTURE: Culture: NO GROWTH

## 2022-03-28 ENCOUNTER — Telehealth: Payer: Self-pay | Admitting: Family Medicine

## 2022-03-28 NOTE — Telephone Encounter (Signed)
Transition Care Management Unsuccessful Follow-up Telephone Call  Date of discharge and from where:  [name] 03/23/22  Attempts:  1st Attempt  Reason for unsuccessful TCM follow-up call:  No answer/busy

## 2022-03-31 ENCOUNTER — Emergency Department (HOSPITAL_COMMUNITY)
Admission: EM | Admit: 2022-03-31 | Discharge: 2022-03-31 | Disposition: A | Discharge status: Home / Self Care | Payer: Medicaid Other | Attending: Emergency Medicine | Admitting: Emergency Medicine

## 2022-03-31 ENCOUNTER — Other Ambulatory Visit: Payer: Self-pay

## 2022-03-31 ENCOUNTER — Encounter (HOSPITAL_COMMUNITY): Payer: Self-pay | Admitting: Emergency Medicine

## 2022-03-31 DIAGNOSIS — Z87891 Personal history of nicotine dependence: Secondary | ICD-10-CM | POA: Diagnosis not present

## 2022-03-31 DIAGNOSIS — Z7984 Long term (current) use of oral hypoglycemic drugs: Secondary | ICD-10-CM | POA: Insufficient documentation

## 2022-03-31 DIAGNOSIS — R309 Painful micturition, unspecified: Secondary | ICD-10-CM | POA: Diagnosis present

## 2022-03-31 DIAGNOSIS — Z794 Long term (current) use of insulin: Secondary | ICD-10-CM | POA: Diagnosis not present

## 2022-03-31 DIAGNOSIS — E1140 Type 2 diabetes mellitus with diabetic neuropathy, unspecified: Secondary | ICD-10-CM | POA: Diagnosis not present

## 2022-03-31 DIAGNOSIS — R399 Unspecified symptoms and signs involving the genitourinary system: Secondary | ICD-10-CM

## 2022-03-31 LAB — URINALYSIS, ROUTINE W REFLEX MICROSCOPIC
Bacteria, UA: NONE SEEN
Bilirubin Urine: NEGATIVE
Glucose, UA: >=500 mg/dL — AB
Hgb urine dipstick: NEGATIVE
Ketones, ur: NEGATIVE mg/dL
Leukocytes,Ua: NEGATIVE
Nitrite: NEGATIVE
Protein, ur: 100 mg/dL — AB
Specific Gravity, Urine: 1.021 (ref 1.005–1.030)
pH: 5 (ref 5.0–8.0)

## 2022-03-31 LAB — CBC WITH DIFFERENTIAL/PLATELET
Abs Immature Granulocytes: 0.01 10*3/uL (ref 0.00–0.07)
Basophils Absolute: 0 10*3/uL (ref 0.0–0.1)
Basophils Relative: 1 %
Eosinophils Absolute: 0.2 10*3/uL (ref 0.0–0.5)
Eosinophils Relative: 4 %
HCT: 40.9 % (ref 39.0–52.0)
Hemoglobin: 13.1 g/dL (ref 13.0–17.0)
Immature Granulocytes: 0 %
Lymphocytes Relative: 35 %
Lymphs Abs: 2.1 10*3/uL (ref 0.7–4.0)
MCH: 31 pg (ref 26.0–34.0)
MCHC: 32 g/dL (ref 30.0–36.0)
MCV: 96.7 fL (ref 80.0–100.0)
Monocytes Absolute: 0.5 10*3/uL (ref 0.1–1.0)
Monocytes Relative: 9 %
Neutro Abs: 3 10*3/uL (ref 1.7–7.7)
Neutrophils Relative %: 51 %
Platelets: 214 10*3/uL (ref 150–400)
RBC: 4.23 MIL/uL (ref 4.22–5.81)
RDW: 13.2 % (ref 11.5–15.5)
WBC: 5.8 10*3/uL (ref 4.0–10.5)
nRBC: 0 % (ref 0.0–0.2)

## 2022-03-31 LAB — COMPREHENSIVE METABOLIC PANEL
ALT: 62 U/L — ABNORMAL HIGH (ref 0–44)
AST: 95 U/L — ABNORMAL HIGH (ref 15–41)
Albumin: 2.8 g/dL — ABNORMAL LOW (ref 3.5–5.0)
Alkaline Phosphatase: 55 U/L (ref 38–126)
Anion gap: 4 — ABNORMAL LOW (ref 5–15)
BUN: 9 mg/dL (ref 6–20)
CO2: 32 mmol/L (ref 22–32)
Calcium: 8.3 mg/dL — ABNORMAL LOW (ref 8.9–10.3)
Chloride: 100 mmol/L (ref 98–111)
Creatinine, Ser: 0.61 mg/dL (ref 0.61–1.24)
GFR, Estimated: >60 mL/min (ref >60)
Glucose, Bld: 83 mg/dL (ref 70–99)
Potassium: 3.7 mmol/L (ref 3.5–5.1)
Sodium: 136 mmol/L (ref 135–145)
Total Bilirubin: 1.6 mg/dL — ABNORMAL HIGH (ref 0.3–1.2)
Total Protein: 8.2 g/dL — ABNORMAL HIGH (ref 6.5–8.1)

## 2022-03-31 NOTE — ED Notes (Signed)
[facility] called to transport patient back to [facility]. Paramedic aware

## 2022-03-31 NOTE — ED Triage Notes (Signed)
Pt via [facility] due to not eating and decreased urinary output. Ambulated to stretcher. Cbg 100. Pt mildy emaciated. Using [employer] at this time.

## 2022-03-31 NOTE — ED Provider Notes (Signed)
[facility] EMERGENCY DEPARTMENT Provider Note  CSN: [id number] Arrival date & time: 03/31/22 1132  Chief Complaint(s) Failure To Thrive  HPI Garrett [name] is a 36 y.o. male with a history of depression, psychosis, diabetes, hepatitis B presenting to the emergency department with urinary symptoms.  Patient reports that he has some painful urination.  Denies any abdominal pain, fevers, chills, nausea, vomiting.  He reports that he is eating and drinking adequately.  EMS report that they brought him in because he was not eating as much.  No homicidal or suicidal thoughts.  Patient has previous visits diagnosed with urinary infection.  Discussed with facility who reports that he is not eating or drinking and they want [place] to do something about it.  Certified [name] interpreter used.  Past Medical History Past Medical History:  Diagnosis Date   Acute metabolic encephalopathy 32/44/0102   Altered mental status    Anemia 05/05/2021   Chronic Hepatitis B 05/12/2021   Chronic hepatitis B without hepatic coma ([place]) 01/14/2022   Depression    Diabetes mellitus ([place])    Elevated LFTs 03/09/2020   Hypoalbuminemia 01/24/2022   Hyponatremia 01/24/2022   Hypotension 01/26/2022   Major depressive disorder, recurrent episode, moderate with mood-congruent psychotic features ([place])    Major depressive disorder, recurrent severe without psychotic features ([place]) 06/20/2017   Physical deconditioning    Postoperative intra-abdominal abscess 01/20/2022   Severe malnutrition ([place]) 01/18/2022   Severe malnutrition ([place]) 01/18/2022   Patient Active Problem List   Diagnosis Date Noted   Acute cystitis without hematuria 03/10/2022   Intractable nausea and vomiting 03/10/2022   Hypotension 01/26/2022   Hypoalbuminemia 01/24/2022   Hyponatremia 01/24/2022   Postoperative intra-abdominal abscess 01/20/2022   Severe malnutrition ([place]) 01/18/2022   Chronic hepatitis B without hepatic coma ([place]) 01/14/2022    Transaminitis 09/26/2021   Constipation 06/29/2021   Peripheral neuropathy 06/28/2021   Generalized weakness    Low back pain 05/24/2021   Major depressive disorder, recurrent episode, moderate with mood-congruent psychotic features ([place])    Chronic Hepatitis B 05/12/2021   Physical deconditioning    Anemia 05/05/2021   Altered mental status    Controlled type 2 diabetes mellitus without complication, with long-term current use of insulin ([place]) 72/53/6644   Acute metabolic encephalopathy 03/47/4259   Elevated LFTs 03/09/2020   Major depressive disorder, recurrent severe without psychotic features ([place]) 06/20/2017   Depression, major, recurrent, severe with psychosis ([place]) 06/19/2017   Home Medication(s) Prior to Admission medications   Medication Sig Start Date End Date Taking? Authorizing Provider  Accu-Chek Softclix Lancets lancets Use to test blood sugars up to 4 times daily as directed. 07/11/21   Samella Parr, NP  acetaminophen (TYLENOL) 325 MG tablet Take 2 tablets (650 mg total) by mouth every 6 (six) hours as needed for mild pain or moderate pain. 07/11/21   Samella Parr, NP  ANTI-DIARRHEAL 2 MG tablet Take by mouth. 02/22/22   [provider]  benztropine (COGENTIN) 0.5 MG tablet Take 1 tablet (0.5 mg total) by mouth 2 (two) times daily. 07/11/21   Samella Parr, NP  Blood Glucose Monitoring Suppl (BLOOD GLUCOSE MONITOR SYSTEM) w/Device KIT Use to test blood sugars up to 4 times daily as directed. 07/11/21   Samella Parr, NP  escitalopram (LEXAPRO) 10 MG tablet Take 1 tablet (10 mg total) by mouth daily. 07/12/21   Samella Parr, NP  feeding supplement, GLUCERNA SHAKE, (GLUCERNA SHAKE) LIQD Take 237 mLs by mouth 3 (  three) times daily between meals. Patient not taking: Reported on 03/23/2022 07/11/21   Samella Parr, NP  gabapentin (NEURONTIN) 100 MG capsule Take 1 capsule (100 mg total) by mouth 2 (two) times daily. 07/11/21   Samella Parr, NP   glipiZIDE (GLUCOTROL) 5 MG tablet Take 5 mg by mouth daily before breakfast.    [provider]  glucose blood (ACCU-CHEK GUIDE) test strip Use to check blood sugars up to 4 times daily as directed. 07/11/21   Samella Parr, NP  insulin aspart (NOVOLOG) 100 UNIT/ML FlexPen Inject 0-6 Units into the skin 3 (three) times daily with meals. Patient not taking: Reported on 03/23/2022 07/11/21   Samella Parr, NP  insulin glargine (LANTUS) 100 UNIT/ML Solostar Pen Inject 10 Units into the skin daily. 03/10/22   Barton Dubois, MD  Insulin Pen Needle 32G X 4 MM MISC Use to inject insulin up to 4 times daily. 07/11/21   Samella Parr, NP  LORazepam (ATIVAN) 0.5 MG tablet Take 1 tablet (0.5 mg total) by mouth at bedtime. Patient not taking: Reported on 03/23/2022 07/11/21   Samella Parr, NP  miconazole nitrate (MICATIN) POWD Apply 1 application. topically 3 (three) times daily. Patient not taking: Reported on 03/23/2022 07/11/21   Samella Parr, NP  midodrine (PROAMATINE) 10 MG tablet Take 1 tablet (10 mg total) by mouth 3 (three) times daily with meals. 03/10/22   Barton Dubois, MD  ondansetron (ZOFRAN-ODT) 4 MG disintegrating tablet Take 1 tablet (4 mg total) by mouth every 8 (eight) hours as needed for up to 15 doses for nausea or vomiting. 02/27/22   Wyvonnia Dusky, MD  promethazine (PHENERGAN) 25 MG suppository Place 1 suppository (25 mg total) rectally every 8 (eight) hours as needed for refractory nausea / vomiting. 03/10/22   Barton Dubois, MD  tamsulosin (FLOMAX) 0.4 MG CAPS capsule Take 1 capsule (0.4 mg total) by mouth daily. 02/09/22   Irine Seal, MD                                                                                                                                    Past Surgical History Past Surgical History:  Procedure Laterality Date   APPENDECTOMY     EXPLORATORY LAPAROTOMY     Family History History reviewed. No pertinent family history.  Social  History Social History   Tobacco Use   Smoking status: Former    Types: Cigarettes   Smokeless tobacco: Never  Substance Use Topics   Alcohol use: Not Currently    Comment: occassionally   Drug use: Not Currently   Allergies Patient has no known allergies.  Review of Systems Review of Systems  All other systems reviewed and are negative.   Physical Exam Vital Signs  I have reviewed the triage vital signs BP 126/80 (BP Location: Right Arm)   Pulse 80   Temp 98 F (36.7 C) (  Oral)   Resp 18   SpO2 99%  Physical Exam Vitals and nursing note reviewed.  Constitutional:      General: He is not in acute distress.    Appearance: Normal appearance.  HENT:     Mouth/Throat:     Mouth: Mucous membranes are moist.  Eyes:     Conjunctiva/sclera: Conjunctivae normal.  Cardiovascular:     Rate and Rhythm: Normal rate and regular rhythm.  Pulmonary:     Effort: Pulmonary effort is normal. No respiratory distress.     Breath sounds: Normal breath sounds.  Abdominal:     General: Abdomen is flat.     Palpations: Abdomen is soft.     Tenderness: There is no abdominal tenderness.  Genitourinary:    Comments: Foley catheter in place Musculoskeletal:     Right lower leg: No edema.     Left lower leg: No edema.  Skin:    General: Skin is warm and dry.     Capillary Refill: Capillary refill takes less than 2 seconds.  Neurological:     Mental Status: He is alert and oriented to person, place, and time. Mental status is at baseline.  Psychiatric:        Mood and Affect: Mood normal.        Behavior: Behavior normal.     ED Results and Treatments Labs (all labs ordered are listed, but only abnormal results are displayed) Labs Reviewed  URINALYSIS, ROUTINE W REFLEX MICROSCOPIC - Abnormal; Notable for the following components:      Result Value   Color, Urine AMBER (*)    APPearance CLOUDY (*)    Glucose, UA >=500 (*)    Protein, ur 100 (*)    All other components within  normal limits  COMPREHENSIVE METABOLIC PANEL - Abnormal; Notable for the following components:   Calcium 8.3 (*)    Total Protein 8.2 (*)    Albumin 2.8 (*)    AST 95 (*)    ALT 62 (*)    Total Bilirubin 1.6 (*)    Anion gap 4 (*)    All other components within normal limits  CBC WITH DIFFERENTIAL/PLATELET                                                                                                                          Radiology No results found.  Pertinent labs & imaging results that were available during my care of the patient were reviewed by me and considered in my medical decision making (see MDM for details).  Medications Ordered in ED Medications - No data to display  Procedures Procedures  (including critical care time)  Medical Decision Making / ED Course   MDM:  36 year old male presenting to the emergency department with dysuria.  Patient well-appearing, vital signs reassuring.  Physical exam with no obvious acute abnormality.  Does not appear dehydrated.  Urinalysis obtained from Foley catheter which is reassuring without signs of bacteria, leukocytes.  Given that this was obtained from existing Foley extremely low concern for any infection.  Patient denies any other complaints and does not appear psychotic, denies suicidal or homicidal thoughts.  Discussed further with facility, they would like [place] to "do something "as he is not eating or drinking.  Patient reports that he is eating and drinking.  He appears well-hydrated.  His laboratory testing is reassuring without evidence of acute kidney injury.  He is not tachycardic to suggest dehydration.  His total protein is actually elevated not suggestive of malnutrition.      Additional history obtained: -Additional history obtained from EMS and nursing facility  -External records  from outside source obtained and reviewed including: Chart review including previous notes, labs, imaging, consultation notes including ED visit 03/23/22   Lab Tests: -I ordered, reviewed, and interpreted labs.   The pertinent results include:   Labs Reviewed  URINALYSIS, ROUTINE W REFLEX MICROSCOPIC - Abnormal; Notable for the following components:      Result Value   Color, Urine AMBER (*)    APPearance CLOUDY (*)    Glucose, UA >=500 (*)    Protein, ur 100 (*)    All other components within normal limits  COMPREHENSIVE METABOLIC PANEL - Abnormal; Notable for the following components:   Calcium 8.3 (*)    Total Protein 8.2 (*)    Albumin 2.8 (*)    AST 95 (*)    ALT 62 (*)    Total Bilirubin 1.6 (*)    Anion gap 4 (*)    All other components within normal limits  CBC WITH DIFFERENTIAL/PLATELET    Notable for no acute kidney injury or electrolyte abnormality suggestive of dehydration or malnutrition. UA without signs of infection  Medicines ordered and prescription drug management: No orders of the defined types were placed in this encounter.   -I have reviewed the patients home medicines and have made adjustments as needed   Social Determinants of Health:  Diagnosis or treatment significantly limited by social determinants of health: lives in group home   Co morbidities that complicate the patient evaluation  Past Medical History:  Diagnosis Date   Acute metabolic encephalopathy 20/35/5974   Altered mental status    Anemia 05/05/2021   Chronic Hepatitis B 05/12/2021   Chronic hepatitis B without hepatic coma ([place]) 01/14/2022   Depression    Diabetes mellitus ([place])    Elevated LFTs 03/09/2020   Hypoalbuminemia 01/24/2022   Hyponatremia 01/24/2022   Hypotension 01/26/2022   Major depressive disorder, recurrent episode, moderate with mood-congruent psychotic features ([place])    Major depressive disorder, recurrent severe without psychotic features ([place]) 06/20/2017    Physical deconditioning    Postoperative intra-abdominal abscess 01/20/2022   Severe malnutrition ([place]) 01/18/2022   Severe malnutrition ([place]) 01/18/2022      Dispostion: Disposition decision including need for hospitalization was considered, and patient discharged from emergency department.    Final Clinical Impression(s) / ED Diagnoses Final diagnoses:  Symptoms involving urinary system     This chart was dictated using voice recognition software.  Despite best efforts to proofread,  errors can occur which can change the  documentation meaning.    Cristie Hem, MD 03/31/22 1626

## 2022-03-31 NOTE — Discharge Instructions (Addendum)
We evaluated you for your painful urination.  Your urinalysis did not show signs of any urine infection.  Please follow-up closely with your primary physician.  Please return to the emergency department as needed for any worsening symptoms.

## 2022-04-09 NOTE — Therapy (Incomplete)
OUTPATIENT PHYSICAL THERAPY LOWER EXTREMITY EVALUATION   Patient Name: Garrett [name] MRN: [id number] DOB:03/21/1986, 37 y.o., male Today's Date: 04/09/2022  END OF SESSION:   Past Medical History:  Diagnosis Date   Acute metabolic encephalopathy 70/17/7939   Altered mental status    Anemia 05/05/2021   Chronic Hepatitis B 05/12/2021   Chronic hepatitis B without hepatic coma ([place]) 01/14/2022   Depression    Diabetes mellitus ([place])    Elevated LFTs 03/09/2020   Hypoalbuminemia 01/24/2022   Hyponatremia 01/24/2022   Hypotension 01/26/2022   Major depressive disorder, recurrent episode, moderate with mood-congruent psychotic features ([place])    Major depressive disorder, recurrent severe without psychotic features ([place]) 06/20/2017   Physical deconditioning    Postoperative intra-abdominal abscess 01/20/2022   Severe malnutrition ([place]) 01/18/2022   Severe malnutrition ([place]) 01/18/2022   Past Surgical History:  Procedure Laterality Date   APPENDECTOMY     EXPLORATORY LAPAROTOMY     Patient Active Problem List   Diagnosis Date Noted   Acute cystitis without hematuria 03/10/2022   Intractable nausea and vomiting 03/10/2022   Hypotension 01/26/2022   Hypoalbuminemia 01/24/2022   Hyponatremia 01/24/2022   Postoperative intra-abdominal abscess 01/20/2022   Severe malnutrition ([place]) 01/18/2022   Chronic hepatitis B without hepatic coma ([place]) 01/14/2022   Transaminitis 09/26/2021   Constipation 06/29/2021   Peripheral neuropathy 06/28/2021   Generalized weakness    Low back pain 05/24/2021   Major depressive disorder, recurrent episode, moderate with mood-congruent psychotic features ([place])    Chronic Hepatitis B 05/12/2021   Physical deconditioning    Anemia 05/05/2021   Altered mental status    Controlled type 2 diabetes mellitus without complication, with long-term current use of insulin ([place]) 03/00/9233   Acute metabolic encephalopathy 00/76/2263   Elevated LFTs 03/09/2020    Major depressive disorder, recurrent severe without psychotic features ([place]) 06/20/2017   Depression, major, recurrent, severe with psychosis ([place]) 06/19/2017    PCP: ***  REFERRING PROVIDER: ***  REFERRING DIAG: ***  THERAPY DIAG:  No diagnosis found.  Rationale for Evaluation and Treatment: {HABREHAB:27488}  ONSET DATE: ***  SUBJECTIVE:   SUBJECTIVE STATEMENT: ***  PERTINENT HISTORY: *** PAIN:  Are you having pain? {OPRCPAIN:27236}  PRECAUTIONS: {Therapy precautions:24002}  WEIGHT BEARING RESTRICTIONS: {Yes ***/No:24003}  FALLS:  Has patient fallen in last 6 months? {fallsyesno:27318}  LIVING ENVIRONMENT: Lives with: {OPRC lives with:25569::"lives with their family"} Lives in: {Lives in:25570} Stairs: {opstairs:27293} Has following equipment at home: {Assistive devices:23999}  OCCUPATION: ***  PLOF: {PLOF:24004}  PATIENT GOALS: ***  NEXT MD VISIT:   OBJECTIVE:   DIAGNOSTIC FINDINGS: ***  PATIENT SURVEYS:  {rehab surveys:24030}  COGNITION: Overall cognitive status: {cognition:24006}     SENSATION: {sensation:27233}  EDEMA:  {edema:24020}  MUSCLE LENGTH: Hamstrings: Right *** deg; Left *** deg Thomas test: Right *** deg; Left *** deg  POSTURE: {posture:25561}  PALPATION: ***  LOWER EXTREMITY ROM:  {AROM/PROM:27142} ROM Right eval Left eval  Hip flexion    Hip extension    Hip abduction    Hip adduction    Hip internal rotation    Hip external rotation    Knee flexion    Knee extension    Ankle dorsiflexion    Ankle plantarflexion    Ankle inversion    Ankle eversion     (Blank rows = not tested)  LOWER EXTREMITY MMT:  MMT Right eval Left eval  Hip flexion    Hip extension    Hip abduction    Hip adduction  Hip internal rotation    Hip external rotation    Knee flexion    Knee extension    Ankle dorsiflexion    Ankle plantarflexion    Ankle inversion    Ankle eversion     (Blank rows = not tested)  LOWER  EXTREMITY SPECIAL TESTS:  {LEspecialtests:26242}  FUNCTIONAL TESTS:  {Functional tests:24029}  GAIT: Distance walked: *** Assistive device utilized: {Assistive devices:23999} Level of assistance: {Levels of assistance:24026} Comments: ***   TODAY'S TREATMENT:                                                                                                                              DATE: ***    PATIENT EDUCATION:  Education details: *** Person educated: {Person educated:25204} Education method: {Education Method:25205} Education comprehension: {Education Comprehension:25206}  HOME EXERCISE PROGRAM: ***  ASSESSMENT:  CLINICAL IMPRESSION: Patient is a *** y.o. *** who was seen today for physical therapy evaluation and treatment for ***.   OBJECTIVE IMPAIRMENTS: {opptimpairments:25111}.   ACTIVITY LIMITATIONS: {activitylimitations:27494}  PARTICIPATION LIMITATIONS: {participationrestrictions:25113}  PERSONAL FACTORS: {Personal factors:25162} are also affecting patient's functional outcome.   REHAB POTENTIAL: {rehabpotential:25112}  CLINICAL DECISION MAKING: {clinical decision making:25114}  EVALUATION COMPLEXITY: {Evaluation complexity:25115}   GOALS: Goals reviewed with patient? {yes/no:20286}  SHORT TERM GOALS: Target date: *** *** Baseline: Goal status: {GOALSTATUS:25110}  2.  *** Baseline:  Goal status: {GOALSTATUS:25110}  3.  *** Baseline:  Goal status: {GOALSTATUS:25110}  4.  *** Baseline:  Goal status: {GOALSTATUS:25110}  5.  *** Baseline:  Goal status: {GOALSTATUS:25110}  6.  *** Baseline:  Goal status: {GOALSTATUS:25110}  LONG TERM GOALS: Target date: ***  *** Baseline:  Goal status: {GOALSTATUS:25110}  2.  *** Baseline:  Goal status: {GOALSTATUS:25110}  3.  *** Baseline:  Goal status: {GOALSTATUS:25110}  4.  *** Baseline:  Goal status: {GOALSTATUS:25110}  5.  *** Baseline:  Goal status: {GOALSTATUS:25110}  6.   *** Baseline:  Goal status: {GOALSTATUS:25110}   PLAN:  PT FREQUENCY: {rehab frequency:25116}  PT DURATION: {rehab duration:25117}  PLANNED INTERVENTIONS: {rehab planned interventions:25118::"Therapeutic exercises","Therapeutic activity","Neuromuscular re-education","Balance training","Gait training","Patient/Family education","Self Care","Joint mobilization"}  PLAN FOR NEXT SESSION: ***   Marisue Brooklyn, PT 04/09/2022, 11:39 AM

## 2022-04-12 ENCOUNTER — Ambulatory Visit (HOSPITAL_COMMUNITY): Payer: Medicaid Other | Attending: Internal Medicine

## 2022-04-13 ENCOUNTER — Ambulatory Visit (INDEPENDENT_AMBULATORY_CARE_PROVIDER_SITE_OTHER): Payer: Medicaid Other | Admitting: Podiatry

## 2022-04-13 VITALS — BP 119/65 | HR 78

## 2022-04-13 DIAGNOSIS — B351 Tinea unguium: Secondary | ICD-10-CM

## 2022-04-13 DIAGNOSIS — M79675 Pain in left toe(s): Secondary | ICD-10-CM | POA: Diagnosis not present

## 2022-04-13 DIAGNOSIS — E1142 Type 2 diabetes mellitus with diabetic polyneuropathy: Secondary | ICD-10-CM | POA: Diagnosis not present

## 2022-04-13 DIAGNOSIS — M79674 Pain in right toe(s): Secondary | ICD-10-CM | POA: Diagnosis not present

## 2022-04-13 NOTE — Progress Notes (Signed)
  Subjective:  Patient ID: Garrett [name], male    DOB: 05-07-1985,  MRN: [id number]  Chief Complaint  Patient presents with   foot care    Diabetic foot care.    37 y.o. male presents with the above complaint. History confirmed with patient. Patient presenting with pain related to dystrophic thickened elongated nails. Patient is unable to trim own nails related to nail dystrophy and/or mobility issues. Patient does  have a history of T2DM.   Objective:  Physical Exam: warm, good capillary refill nail exam onychomycosis of the toenails, onycholysis, and dystrophic nails, xerosis of the skin DP pulses palpable, PT pulses palpable, and protective sensation diminished Left Foot:  Pain with palpation of nails due to elongation and dystrophic growth.  Right Foot: Pain with palpation of nails due to elongation and dystrophic growth.   Assessment:   1. Pain due to onychomycosis of toenails of both feet   2. DM type 2 with diabetic peripheral neuropathy ([place])     Plan:  Patient was evaluated and treated and all questions answered.  Patient educated on diabetes. Discussed proper diabetic foot care and discussed risks and complications of disease. Educated patient in depth on reasons to return to the office immediately should he/she discover anything concerning or new on the feet. All questions answered. Discussed proper shoes as well. Recommend lotion daily for feet.   #Onychomycosis with pain  -Nails palliatively debrided as below. -Educated on self-care  Procedure: Nail Debridement Rationale: Pain Type of Debridement: manual, sharp debridement. Instrumentation: Nail nipper, rotary burr. Number of Nails: 10  Return in about 3 months (around 07/13/2022) for [facility].         Everitt Amber, DPM [facility]

## 2022-04-27 ENCOUNTER — Encounter (HOSPITAL_COMMUNITY): Payer: Self-pay | Admitting: *Deleted

## 2022-04-27 ENCOUNTER — Emergency Department (HOSPITAL_COMMUNITY)
Admission: EM | Admit: 2022-04-27 | Discharge: 2022-05-18 | Disposition: A | Discharge status: Home / Self Care | Payer: Medicaid Other | Attending: Emergency Medicine | Admitting: Emergency Medicine

## 2022-04-27 ENCOUNTER — Other Ambulatory Visit: Payer: Self-pay

## 2022-04-27 DIAGNOSIS — Z794 Long term (current) use of insulin: Secondary | ICD-10-CM | POA: Diagnosis not present

## 2022-04-27 DIAGNOSIS — E870 Hyperosmolality and hypernatremia: Secondary | ICD-10-CM | POA: Diagnosis not present

## 2022-04-27 DIAGNOSIS — R627 Adult failure to thrive: Secondary | ICD-10-CM | POA: Insufficient documentation

## 2022-04-27 DIAGNOSIS — E119 Type 2 diabetes mellitus without complications: Secondary | ICD-10-CM | POA: Insufficient documentation

## 2022-04-27 DIAGNOSIS — E860 Dehydration: Secondary | ICD-10-CM | POA: Diagnosis not present

## 2022-04-27 DIAGNOSIS — Z7984 Long term (current) use of oral hypoglycemic drugs: Secondary | ICD-10-CM | POA: Insufficient documentation

## 2022-04-27 DIAGNOSIS — N3000 Acute cystitis without hematuria: Secondary | ICD-10-CM | POA: Diagnosis not present

## 2022-04-27 DIAGNOSIS — Z960 Presence of urogenital implants: Secondary | ICD-10-CM | POA: Diagnosis not present

## 2022-04-27 LAB — CBC WITH DIFFERENTIAL/PLATELET
Abs Immature Granulocytes: 0.05 10*3/uL (ref 0.00–0.07)
Basophils Absolute: 0 10*3/uL (ref 0.0–0.1)
Basophils Relative: 0 %
Eosinophils Absolute: 0 10*3/uL (ref 0.0–0.5)
Eosinophils Relative: 0 %
HCT: 37.7 % — ABNORMAL LOW (ref 39.0–52.0)
Hemoglobin: 12 g/dL — ABNORMAL LOW (ref 13.0–17.0)
Immature Granulocytes: 1 %
Lymphocytes Relative: 16 %
Lymphs Abs: 1.5 10*3/uL (ref 0.7–4.0)
MCH: 31 pg (ref 26.0–34.0)
MCHC: 31.8 g/dL (ref 30.0–36.0)
MCV: 97.4 fL (ref 80.0–100.0)
Monocytes Absolute: 0.8 10*3/uL (ref 0.1–1.0)
Monocytes Relative: 9 %
Neutro Abs: 7 10*3/uL (ref 1.7–7.7)
Neutrophils Relative %: 74 %
Platelets: 169 10*3/uL (ref 150–400)
RBC: 3.87 MIL/uL — ABNORMAL LOW (ref 4.22–5.81)
RDW: 14 % (ref 11.5–15.5)
WBC: 9.5 10*3/uL (ref 4.0–10.5)
nRBC: 0 % (ref 0.0–0.2)

## 2022-04-27 LAB — URINALYSIS, ROUTINE W REFLEX MICROSCOPIC
Glucose, UA: 100 mg/dL — AB
Ketones, ur: NEGATIVE mg/dL
Nitrite: POSITIVE — AB
Protein, ur: >300 mg/dL — AB
Specific Gravity, Urine: >1.03 — ABNORMAL HIGH (ref 1.005–1.030)
pH: 5.5 (ref 5.0–8.0)

## 2022-04-27 LAB — COMPREHENSIVE METABOLIC PANEL
ALT: 37 U/L (ref 0–44)
AST: 59 U/L — ABNORMAL HIGH (ref 15–41)
Albumin: 2.6 g/dL — ABNORMAL LOW (ref 3.5–5.0)
Alkaline Phosphatase: 49 U/L (ref 38–126)
Anion gap: 9 (ref 5–15)
BUN: 25 mg/dL — ABNORMAL HIGH (ref 6–20)
CO2: 29 mmol/L (ref 22–32)
Calcium: 8.1 mg/dL — ABNORMAL LOW (ref 8.9–10.3)
Chloride: 103 mmol/L (ref 98–111)
Creatinine, Ser: 0.8 mg/dL (ref 0.61–1.24)
GFR, Estimated: >60 mL/min (ref >60)
Glucose, Bld: 181 mg/dL — ABNORMAL HIGH (ref 70–99)
Potassium: 4.2 mmol/L (ref 3.5–5.1)
Sodium: 141 mmol/L (ref 135–145)
Total Bilirubin: 1.6 mg/dL — ABNORMAL HIGH (ref 0.3–1.2)
Total Protein: 7.7 g/dL (ref 6.5–8.1)

## 2022-04-27 LAB — CBG MONITORING, ED
Glucose-Capillary: 172 mg/dL — ABNORMAL HIGH (ref 70–99)
Glucose-Capillary: 42 mg/dL — CL (ref 70–99)
Glucose-Capillary: 113 mg/dL — ABNORMAL HIGH (ref 70–99)
Glucose-Capillary: 256 mg/dL — ABNORMAL HIGH (ref 70–99)
Glucose-Capillary: 42 mg/dL — CL (ref 70–99)
Glucose-Capillary: 160 mg/dL — ABNORMAL HIGH (ref 70–99)
Glucose-Capillary: 133 mg/dL — ABNORMAL HIGH (ref 70–99)

## 2022-04-27 LAB — URINALYSIS, MICROSCOPIC (REFLEX)
RBC / HPF: >50 RBC/hpf (ref 0–5)
WBC, UA: >50 WBC/hpf (ref 0–5)

## 2022-04-27 MED ORDER — SODIUM CHLORIDE 0.9 % IV SOLN: 1.0000 g | Freq: Once | INTRAVENOUS | Status: DC

## 2022-04-27 MED ORDER — DEXTROSE 50 % IV SOLN: 1.0000 | Freq: Once | INTRAVENOUS | Status: AC

## 2022-04-27 MED ORDER — SODIUM CHLORIDE 0.9 % IV SOLN
1.0000 g | Freq: Once | INTRAVENOUS | Status: AC
  Administered 2022-04-27: 1 g via INTRAVENOUS
  Filled 2022-04-27: qty 10

## 2022-04-27 MED ORDER — DEXTROSE 50 % IV SOLN
INTRAVENOUS | Status: AC
  Administered 2022-04-27: 50 mL via INTRAVENOUS
  Filled 2022-04-27: qty 50

## 2022-04-27 MED ORDER — DEXTROSE 50 % IV SOLN
1.0000 | Freq: Once | INTRAVENOUS | Status: AC
  Administered 2022-04-27: 50 mL via INTRAVENOUS

## 2022-04-27 MED ORDER — DEXTROSE 10 % IV SOLN: Freq: Once | INTRAVENOUS | Status: AC

## 2022-04-27 MED ORDER — CEPHALEXIN 500 MG PO CAPS: 500.0000 mg | ORAL_CAPSULE | Freq: Three times a day (TID) | ORAL | 0 refills | Status: DC

## 2022-04-27 MED ORDER — SODIUM CHLORIDE 0.9 % IV BOLUS
1000.0000 mL | Freq: Once | INTRAVENOUS | Status: DC
  Administered 2022-04-27: 1000 mL via INTRAVENOUS

## 2022-04-27 MED ORDER — CEPHALEXIN 500 MG PO CAPS
500.0000 mg | ORAL_CAPSULE | Freq: Three times a day (TID) | ORAL | Status: DC
  Administered 2022-04-28: 500 mg via ORAL
  Filled 2022-04-27 (×3): qty 1

## 2022-04-27 NOTE — ED Notes (Signed)
Offered patient water to drink. Patient declined.

## 2022-04-27 NOTE — ED Notes (Signed)
TOC consulted for placement due to [facility] with [facility] (facility pt arrived from) refusing to accept pt back. Garrett [name] states they can no longer care for pt. CSW spoke to Garrett [name] who has a male bed open and asked that referral be faxed over. CSW to fax referral and follow up. CSW reached out to DSS supervisor Garrett [name] to see if there is an open case with pt. TOC to follow.

## 2022-04-27 NOTE — ED Triage Notes (Signed)
Pt brought in by [facility] for c/o failure to thrive;  staff told ems that he has not been eating or drinking since November;   The staff told ems that they think something is wrong with his foley catheter  because it is not putting out a lot of urine  Urine in bag is milky pink in color  Cbg with ems 65    20G R AC  BP 127/70 HR 100 O2 98%

## 2022-04-27 NOTE — ED Notes (Signed)
Patinets CBG 42. Amp d50 given, MD aware. Pt refuses to intake anything PO

## 2022-04-27 NOTE — ED Provider Notes (Signed)
Informed that patient's glucose came back at 42.  He is getting an amp of D50.  Will continue to monitor. Physical Exam  BP (!) 123/91   Pulse 84   Resp 16   SpO2 99%   Physical Exam  Procedures  Procedures  ED Course / MDM    Medical Decision Making Amount and/or Complexity of Data Reviewed Labs: ordered.  Risk Prescription drug management.   No further episodes of hypoglycemia during my shift       Hayden Rasmussen, MD 04/28/22 1012

## 2022-04-27 NOTE — Discharge Instructions (Addendum)
Keep the lesion/erosion at the base of the penis moist with a moist dressing.  You will need to have a new bladder catheter placed by radiology called a suprapubic catheter.  This order has been placed and should be performed in the next 1-2 weeks.

## 2022-04-27 NOTE — ED Provider Notes (Signed)
[place] EMERGENCY DEPARTMENT AT [facility] Provider Note   CSN: [id number] Arrival date & time: 04/27/22  1122     History  Chief Complaint  Patient presents with  . Failure To Thrive    Garrett [name] is a 37 y.o. male.  HPI   37 y/o male - very unfortunate situation - he is in a NH - due to ruptured appendicitis / s/p ex lap - I believe he is a ward of the state, he has been seen in this emergency department multiple times for failure to thrive, I have seen him personally several times, he was admitted in December 2023 with acute cystitis and transaminitis with severe malnutrition, he continues to have ongoing failure to thrive and refuses to eat at his nursing facility.  He was sent in because of what appeared to be cloudy appearing urine in his Foley catheter.  There is no abnormal vital signs.  The patient cannot give any information, we tried to use the interpreter for his language and he cannot speak to them  Home Medications Prior to Admission medications   Medication Sig Start Date End Date Taking? Authorizing Provider  acetaminophen (TYLENOL) 325 MG tablet Take 2 tablets (650 mg total) by mouth every 6 (six) hours as needed for mild pain or moderate pain. 07/11/21  Yes Russella Dar, NP  ANTI-DIARRHEAL 2 MG tablet Take 2 mg by mouth as needed for diarrhea or loose stools. 02/22/22  Yes [provider]  benztropine (COGENTIN) 0.5 MG tablet Take 1 tablet (0.5 mg total) by mouth 2 (two) times daily. Patient taking differently: Take 0.5 mg by mouth at bedtime. 07/11/21  Yes Russella Dar, NP  cephALEXin (KEFLEX) 500 MG capsule Take 1 capsule (500 mg total) by mouth 3 (three) times daily for 10 days. 04/27/22 05/07/22 Yes Eber Hong, MD  escitalopram (LEXAPRO) 10 MG tablet Take 1 tablet (10 mg total) by mouth daily. 07/12/21  Yes Russella Dar, NP  gabapentin (NEURONTIN) 100 MG capsule Take 1 capsule (100 mg total) by mouth 2 (two) times daily. 07/11/21  Yes  Russella Dar, NP  glipiZIDE (GLUCOTROL) 5 MG tablet Take 5 mg by mouth daily before breakfast.   Yes [provider]  insulin glargine (LANTUS) 100 UNIT/ML Solostar Pen Inject 10 Units into the skin daily. 03/10/22  Yes Vassie Loll, MD  midodrine (PROAMATINE) 10 MG tablet Take 1 tablet (10 mg total) by mouth 3 (three) times daily with meals. 03/10/22  Yes Vassie Loll, MD  promethazine (PHENERGAN) 25 MG suppository Place 1 suppository (25 mg total) rectally every 8 (eight) hours as needed for refractory nausea / vomiting. 03/10/22  Yes Vassie Loll, MD  tamsulosin (FLOMAX) 0.4 MG CAPS capsule Take 1 capsule (0.4 mg total) by mouth daily. 02/09/22  Yes Bjorn Pippin, MD  Accu-Chek Softclix Lancets lancets Use to test blood sugars up to 4 times daily as directed. 07/11/21   Russella Dar, NP  Blood Glucose Monitoring Suppl (BLOOD GLUCOSE MONITOR SYSTEM) w/Device KIT Use to test blood sugars up to 4 times daily as directed. 07/11/21   Russella Dar, NP  feeding supplement, GLUCERNA SHAKE, (GLUCERNA SHAKE) LIQD Take 237 mLs by mouth 3 (three) times daily between meals. Patient not taking: Reported on 04/27/2022 07/11/21   Russella Dar, NP  glucose blood (ACCU-CHEK GUIDE) test strip Use to check blood sugars up to 4 times daily as directed. 07/11/21   Russella Dar, NP  insulin aspart (  NOVOLOG) 100 UNIT/ML FlexPen Inject 0-6 Units into the skin 3 (three) times daily with meals. Patient not taking: Reported on 04/27/2022 07/11/21   Russella Dar, NP  Insulin Pen Needle 32G X 4 MM MISC Use to inject insulin up to 4 times daily. 07/11/21   Russella Dar, NP  LORazepam (ATIVAN) 0.5 MG tablet Take 1 tablet (0.5 mg total) by mouth at bedtime. Patient not taking: Reported on 04/27/2022 07/11/21   Russella Dar, NP  miconazole nitrate (MICATIN) POWD Apply 1 application. topically 3 (three) times daily. 07/11/21   Russella Dar, NP  ondansetron (ZOFRAN-ODT) 4 MG disintegrating tablet  Take 1 tablet (4 mg total) by mouth every 8 (eight) hours as needed for up to 15 doses for nausea or vomiting. Patient not taking: Reported on 04/27/2022 02/27/22   Terald Sleeper, MD      Allergies    Patient has no known allergies.    Review of Systems   Review of Systems  Unable to perform ROS: Other    Physical Exam Updated Vital Signs BP (!) 133/102   Pulse 85   Resp 17   SpO2 98%  Physical Exam Vitals and nursing note reviewed.  Constitutional:      General: He is not in acute distress.    Appearance: He is well-developed.     Comments: Alert, eyes open, severely cachectic  HENT:     Head: Normocephalic and atraumatic.     Mouth/Throat:     Mouth: Mucous membranes are dry.     Pharynx: No oropharyngeal exudate.  Eyes:     General: No scleral icterus.       Right eye: No discharge.        Left eye: No discharge.     Conjunctiva/sclera: Conjunctivae normal.     Pupils: Pupils are equal, round, and reactive to light.  Neck:     Thyroid: No thyromegaly.     Vascular: No JVD.  Cardiovascular:     Rate and Rhythm: Normal rate and regular rhythm.     Heart sounds: Normal heart sounds. No murmur heard.    No friction rub. No gallop.  Pulmonary:     Effort: Pulmonary effort is normal. No respiratory distress.     Breath sounds: Normal breath sounds. No wheezing or rales.  Abdominal:     General: Bowel sounds are normal. There is no distension.     Palpations: Abdomen is soft. There is no mass.     Tenderness: There is no abdominal tenderness.     Comments: Severe abdominal wasting, exploratory laparotomy scar well-healed  Genitourinary:    Comments: Foley catheter in place and the urethral meatus, cloudy milky urine seen in the bag Musculoskeletal:        General: No tenderness. Normal range of motion.     Cervical back: Normal range of motion and neck supple.     Right lower leg: No edema.     Left lower leg: No edema.  Lymphadenopathy:     Cervical: No  cervical adenopathy.  Skin:    General: Skin is warm and dry.     Findings: No erythema or rash.  Neurological:     Mental Status: He is alert.     Coordination: Coordination normal.     Comments: Severely weak, he will open his mouth when asked by the interpreter, when he is asked questions he does not answer anything more than a soft whisper is something that  they cannot understand  Psychiatric:        Behavior: Behavior normal.     ED Results / Procedures / Treatments   Labs (all labs ordered are listed, but only abnormal results are displayed) Labs Reviewed  URINALYSIS, ROUTINE W REFLEX MICROSCOPIC - Abnormal; Notable for the following components:      Result Value   APPearance TURBID (*)    Specific Gravity, Urine >1.030 (*)    Glucose, UA 100 (*)    Hgb urine dipstick LARGE (*)    Bilirubin Urine MODERATE (*)    Protein, ur >300 (*)    Nitrite POSITIVE (*)    Leukocytes,Ua MODERATE (*)    All other components within normal limits  CBC WITH DIFFERENTIAL/PLATELET - Abnormal; Notable for the following components:   RBC 3.87 (*)    Hemoglobin 12.0 (*)    HCT 37.7 (*)    All other components within normal limits  COMPREHENSIVE METABOLIC PANEL - Abnormal; Notable for the following components:   Glucose, Bld 181 (*)    BUN 25 (*)    Calcium 8.1 (*)    Albumin 2.6 (*)    AST 59 (*)    Total Bilirubin 1.6 (*)    All other components within normal limits  URINALYSIS, MICROSCOPIC (REFLEX) - Abnormal; Notable for the following components:   Bacteria, UA MANY (*)    All other components within normal limits  CBG MONITORING, ED - Abnormal; Notable for the following components:   Glucose-Capillary 42 (*)    All other components within normal limits  CBG MONITORING, ED - Abnormal; Notable for the following components:   Glucose-Capillary 172 (*)    All other components within normal limits  CBG MONITORING, ED - Abnormal; Notable for the following components:    Glucose-Capillary 113 (*)    All other components within normal limits  URINE CULTURE    EKG None  Radiology No results found.  Procedures Procedures    Medications Ordered in ED Medications  sodium chloride 0.9 % bolus 1,000 mL (1,000 mLs Intravenous Bolus 04/27/22 1144)  dextrose 50 % solution 50 mL (50 mLs Intravenous Given 04/27/22 1141)  cefTRIAXone (ROCEPHIN) 1 g in sodium chloride 0.9 % 100 mL IVPB (1 g Intravenous New Bag/Given 04/27/22 1440)    ED Course/ Medical Decision Making/ A&P Clinical Course as of 04/29/22 0749  Sat Apr 29, 2022  0749 Culture, Urine (Do not remove urinary catheter, catheter placed by urology or difficult to place)(!) [BM]    Clinical Course User Index [BM] Eber Hong, MD                             Medical Decision Making Amount and/or Complexity of Data Reviewed Labs: ordered.  Risk Prescription drug management.   This patient presents to the ED for concern of failure to thrive, weakness, abnormal urine, dehydration, this involves an extensive number of treatment options, and is a complaint that carries with it a high risk of complications and morbidity.  The differential diagnosis includes urinary infection, severe malnutrition, could be in kidney failure, dehydrated   Co morbidities that complicate the patient evaluation  As above in history, significant language barrier   Additional history obtained:  Additional history obtained from electronic medical record External records from outside source obtained and reviewed including prior admission to the   Lab Tests:  I Ordered, and personally interpreted labs.  The pertinent results include: Urinary  tract infection present, culture sent, I reviewed the medical record which has shown that this patient has had negative urinary cultures the last 2 times it was checked in December.  That being said I have started Rocephin   Cardiac Monitoring: / EKG:  The patient was  maintained on a cardiac monitor.  I personally viewed and interpreted the cardiac monitored which showed an underlying rhythm of: Normal sinus rhythm   Consultations Obtained:  I requested consultation with the transition of care team,  and discussed lab and imaging findings as well as pertinent plan - they recommend: Working on skilled nursing facility placement I actually called the family care home and spoke with Roddie Mc who is evidently the owner of the family care home.  She refuses to take this patient back because he does not participate in his care.  He does not get out of bed, he refuses to eat, he continues to appear dehydrated and she feels that they cannot care for him.  Evidently she states that social work has not been able to place him in a higher level of care up to this point   Problem List / ED Course / Critical interventions / Medication management  Failure to thrive, chronic wasting, urinary tract infection Foley catheter changed out I ordered medication including Rocephin and IV fluids for infection Reevaluation of the patient after these medicines showed that the patient improved but continues to be chronically ill and unable to care for himself I have reviewed the patients home medicines and have made adjustments as needed   Social Determinants of Health:  Significant language barrier, failure to thrive   Test / Admission - Considered:  Will need to be placed, transition of care to evaluate for placement         Final Clinical Impression(s) / ED Diagnoses Final diagnoses:  Acute cystitis without hematuria  Dehydration  Failure to thrive in adult    Rx / DC Orders ED Discharge Orders          Ordered    cephALEXin (KEFLEX) 500 MG capsule  3 times daily        04/27/22 1449              Eber Hong, MD 04/27/22 1513

## 2022-04-27 NOTE — ED Notes (Signed)
CBG 256  °

## 2022-04-27 NOTE — [place] FL2 (Signed)
[place] MEDICAID FL2 LEVEL OF CARE FORM     IDENTIFICATION  Patient Name: Garrett [name] Birthdate: 1985/05/31 Sex: male Admission Date (Current Location): 04/27/2022  [facility] and [place] Number:  [employer] and Address:  [place] [address] [place]      Provider Number: [id number]  Attending Physician Name and Address:  Noemi Chapel, MD  Relative Name and Phone Number:       Current Level of Care: Hospital Recommended Level of Care: [facility] Prior Approval Number:    Date Approved/Denied:   PASRR Number:    Discharge Plan: Other (Comment) ([place])    Current Diagnoses: Patient Active Problem List   Diagnosis Date Noted   Acute cystitis without hematuria 03/10/2022   Intractable nausea and vomiting 03/10/2022   Hypotension 01/26/2022   Hypoalbuminemia 01/24/2022   Hyponatremia 01/24/2022   Postoperative intra-abdominal abscess 01/20/2022   Severe malnutrition ([place]) 01/18/2022   Chronic hepatitis B without hepatic coma ([place]) 01/14/2022   Transaminitis 09/26/2021   Constipation 06/29/2021   Peripheral neuropathy 06/28/2021   Generalized weakness    Low back pain 05/24/2021   Major depressive disorder, recurrent episode, moderate with mood-congruent psychotic features ([place])    Chronic Hepatitis B 05/12/2021   Physical deconditioning    Anemia 05/05/2021   Altered mental status    Controlled type 2 diabetes mellitus without complication, with long-term current use of insulin ([place]) 45/40/9811   Acute metabolic encephalopathy 91/47/8295   Elevated LFTs 03/09/2020   Major depressive disorder, recurrent severe without psychotic features ([place]) 06/20/2017   Depression, major, recurrent, severe with psychosis ([place]) 06/19/2017    Orientation RESPIRATION BLADDER Height & Weight     Self, Situation, Place  Normal Continent, Indwelling catheter Weight:   Height:     BEHAVIORAL SYMPTOMS/MOOD NEUROLOGICAL BOWEL  NUTRITION STATUS      Continent Diet  AMBULATORY STATUS COMMUNICATION OF NEEDS Skin   Extensive Assist Verbally Normal                       Personal Care Assistance Level of Assistance  Bathing, Feeding, Dressing Bathing Assistance: Maximum assistance Feeding assistance: Independent Dressing Assistance: Maximum assistance     Functional Limitations Info  Sight, Hearing, Speech Sight Info: Adequate Hearing Info: Adequate Speech Info: Adequate    SPECIAL CARE FACTORS FREQUENCY                       Contractures Contractures Info: Not present    Additional Factors Info  Code Status, Allergies, Psychotropic Code Status Info: FULL Allergies Info: NKA Psychotropic Info: Lexapro, Ativan         Current Medications (04/27/2022):  This is the current hospital active medication list Current Facility-Administered Medications  Medication Dose Route Frequency Provider Last Rate Last Admin   cephALEXin (KEFLEX) capsule 500 mg  500 mg Oral Q8H Noemi Chapel, MD       Current Outpatient Medications  Medication Sig Dispense Refill   acetaminophen (TYLENOL) 325 MG tablet Take 2 tablets (650 mg total) by mouth every 6 (six) hours as needed for mild pain or moderate pain. 7 tablet 0   ANTI-DIARRHEAL 2 MG tablet Take 2 mg by mouth as needed for diarrhea or loose stools.     benztropine (COGENTIN) 0.5 MG tablet Take 1 tablet (0.5 mg total) by mouth 2 (two) times daily. (Patient taking differently: Take 0.5 mg by mouth at  bedtime.) 7 tablet 0   cephALEXin (KEFLEX) 500 MG capsule Take 1 capsule (500 mg total) by mouth 3 (three) times daily for 10 days. 30 capsule 0   escitalopram (LEXAPRO) 10 MG tablet Take 1 tablet (10 mg total) by mouth daily. 7 tablet 0   gabapentin (NEURONTIN) 100 MG capsule Take 1 capsule (100 mg total) by mouth 2 (two) times daily. 14 capsule 0   glipiZIDE (GLUCOTROL) 5 MG tablet Take 5 mg by mouth daily before breakfast.     insulin glargine (LANTUS) 100  UNIT/ML Solostar Pen Inject 10 Units into the skin daily. 3 mL 3   midodrine (PROAMATINE) 10 MG tablet Take 1 tablet (10 mg total) by mouth 3 (three) times daily with meals. 90 tablet 0   promethazine (PHENERGAN) 25 MG suppository Place 1 suppository (25 mg total) rectally every 8 (eight) hours as needed for refractory nausea / vomiting. 20 each 0   tamsulosin (FLOMAX) 0.4 MG CAPS capsule Take 1 capsule (0.4 mg total) by mouth daily. 30 capsule 11   Accu-Chek Softclix Lancets lancets Use to test blood sugars up to 4 times daily as directed. 100 each 0   Blood Glucose Monitoring Suppl (BLOOD GLUCOSE MONITOR SYSTEM) w/Device KIT Use to test blood sugars up to 4 times daily as directed. 1 kit 0   feeding supplement, GLUCERNA SHAKE, (GLUCERNA SHAKE) LIQD Take 237 mLs by mouth 3 (three) times daily between meals. (Patient not taking: Reported on 04/27/2022) 237 mL 0   glucose blood (ACCU-CHEK GUIDE) test strip Use to check blood sugars up to 4 times daily as directed. 100 each 0   insulin aspart (NOVOLOG) 100 UNIT/ML FlexPen Inject 0-6 Units into the skin 3 (three) times daily with meals. (Patient not taking: Reported on 04/27/2022) 3 mL 3   Insulin Pen Needle 32G X 4 MM MISC Use to inject insulin up to 4 times daily. 100 each 0   LORazepam (ATIVAN) 0.5 MG tablet Take 1 tablet (0.5 mg total) by mouth at bedtime. (Patient not taking: Reported on 04/27/2022) 7 tablet 0   miconazole nitrate (MICATIN) POWD Apply 1 application. topically 3 (three) times daily. 5 g 0   ondansetron (ZOFRAN-ODT) 4 MG disintegrating tablet Take 1 tablet (4 mg total) by mouth every 8 (eight) hours as needed for up to 15 doses for nausea or vomiting. (Patient not taking: Reported on 04/27/2022) 15 tablet 0     Discharge Medications: Please see discharge summary for a list of discharge medications.  Relevant Imaging Results:  Relevant Lab Results:   Additional Information SSN: [id number], [place]

## 2022-04-27 NOTE — ED Notes (Signed)
Patient refuses to take Keflex but would take a few sips of water. MD made aware.

## 2022-04-28 ENCOUNTER — Other Ambulatory Visit: Payer: Self-pay

## 2022-04-28 LAB — CBG MONITORING, ED
Glucose-Capillary: 58 mg/dL — ABNORMAL LOW (ref 70–99)
Glucose-Capillary: 229 mg/dL — ABNORMAL HIGH (ref 70–99)
Glucose-Capillary: 200 mg/dL — ABNORMAL HIGH (ref 70–99)
Glucose-Capillary: 206 mg/dL — ABNORMAL HIGH (ref 70–99)
Glucose-Capillary: 152 mg/dL — ABNORMAL HIGH (ref 70–99)

## 2022-04-28 MED ORDER — DEXTROSE 50 % IV SOLN
1.0000 | Freq: Once | INTRAVENOUS | Status: AC
  Administered 2022-04-28: 50 mL via INTRAVENOUS

## 2022-04-28 MED ORDER — DEXTROSE 50 % IV SOLN
INTRAVENOUS | Status: AC
  Filled 2022-04-28: qty 50

## 2022-04-28 NOTE — ED Notes (Addendum)
Used language interpreter [name] language) to talk to pt and give instructions. Pt has delayed responses to interpreter and interpreter had to repeat questions twice. Pt ate entire container of apple sauce. Took Keflex with apple sauce.

## 2022-04-28 NOTE — ED Notes (Signed)
Patient refuses meal tray but will drink ensure mixed with ice cream.

## 2022-04-28 NOTE — ED Notes (Signed)
Offered patient food and drink.  Patient refused.  Offered patient warm blanket, also refused.

## 2022-04-28 NOTE — ED Notes (Signed)
Pt had BM. Pericare completed and brief changed

## 2022-04-28 NOTE — ED Notes (Addendum)
CSW reached out to [facility] for update on if he has been able to review pts referral. CSW called x2 and unable to leave VM due to VM box being full. TOC to follow.   CSW spoke to [place] with [facility] who states that pts Medicaid is with [facility].   CSW spoke with Carin Primrose, he states that they can manage foley care at their facility. He states that he needs to confirm that pt is eating and drinking before he would be able to make a decision. CSW updated RN and MD that documentation is needed for facility to review.  CSW noted per chart review that per note documented on 03/07/2021 by Earleen Newport, NP with psych that "patient appears to understand conversation and is speaking in [place]". Due to this CSW and [facility] supervisor Nancy Marus spoke with pt at bedside. Pt able to nod and quietly responds to questions.   CSW left VM with Shar Dah Bu Panung (contact in pts chart) requesting call back.

## 2022-04-28 NOTE — ED Notes (Signed)
Pt currently eating a vanilla ensure milkshake.  RN Lynn Ito notified.

## 2022-04-28 NOTE — ED Notes (Signed)
RN notified of CBG. 

## 2022-04-28 NOTE — ED Provider Notes (Signed)
This patient has been holding a cup with his hand and drinking through a straw, he is operating the bed in a semirecumbent position.  Prefers liquids   Noemi Chapel, MD 04/28/22 1520

## 2022-04-29 LAB — CBG MONITORING, ED
Glucose-Capillary: 264 mg/dL — ABNORMAL HIGH (ref 70–99)
Glucose-Capillary: 111 mg/dL — ABNORMAL HIGH (ref 70–99)
Glucose-Capillary: 129 mg/dL — ABNORMAL HIGH (ref 70–99)
Glucose-Capillary: 98 mg/dL (ref 70–99)
Glucose-Capillary: 104 mg/dL — ABNORMAL HIGH (ref 70–99)

## 2022-04-29 LAB — URINE CULTURE: Culture: >=100000 — AB

## 2022-04-29 MED ORDER — CLINDAMYCIN HCL 150 MG PO CAPS
300.0000 mg | ORAL_CAPSULE | Freq: Four times a day (QID) | ORAL | Status: AC
  Administered 2022-04-29 – 2022-05-08 (×24): 300 mg via ORAL
  Filled 2022-04-29 (×27): qty 2

## 2022-04-29 MED ORDER — CLINDAMYCIN HCL 150 MG PO CAPS: 300.0000 mg | ORAL_CAPSULE | Freq: Four times a day (QID) | ORAL | 0 refills | Status: AC

## 2022-04-29 NOTE — ED Notes (Signed)
Nephew came to visit with patient. Tried to convince patient to take medication and brought patient some food. Patient did take medication with jello but spit some of jello back out shortly after. Nephew staying to talk to patient for a little while.

## 2022-04-29 NOTE — ED Notes (Signed)
Offered pt food and drink that family had brought for pt- pt shakes head no and pushes my hand away when offering this. Pt was lying on left side, pt repositioned to back, BP checked, warm blanket applied, call bell within reach.

## 2022-04-29 NOTE — ED Provider Notes (Signed)
Emergency Medicine Observation Re-evaluation Note  Garrett [name] is a 37 y.o. male, seen on rounds today.  Pt initially presented to the ED for complaints of Failure To Thrive Currently, the patient is hemodynamically stable, no complaints, taking diet including liquid and some solids.  Physical Exam  BP 98/74 (BP Location: Left Arm)   Pulse 84   Temp 97.8 F (36.6 C) (Oral)   Resp 16   SpO2 100%  Physical Exam General: No distress, sleeps appropriately, able to sit up in bed, no vomiting Cardiac: Normal rate and rhythm, no murmurs Lungs: Clear to auscultation, no distress Psych: Calm and redirectable  ED Course / MDM  EKG:   I have reviewed the labs performed to date as well as medications administered while in observation.  Recent changes in the last 24 hours include change in the patient's urinary culture.  This is now resulted showing that he actually has methicillin-resistant Staph aureus which will need to change an antibiotic.  The cephalexin was discontinued.  Plan  Current plan is for start clindamycin to treat UTI, this has been ordered 4 times daily while patient is in the ED and the prescription was written as well anticipating expedited discharge  Social work staff has continued to work to find placement.    Noemi Chapel, MD 04/29/22 [phone]

## 2022-04-29 NOTE — ED Notes (Signed)
Assisted patient with breakfast. Patient ate orange jello, two bites of pear, and drank 4oz of apple juice. Patient refusing medication at this time, will try again at lunch time.

## 2022-04-29 NOTE — ED Notes (Signed)
Nephew left phone number with staff and would like to be called if needed or updates. [place] [phone].

## 2022-04-30 LAB — CBG MONITORING, ED
Glucose-Capillary: 97 mg/dL (ref 70–99)
Glucose-Capillary: 80 mg/dL (ref 70–99)
Glucose-Capillary: 89 mg/dL (ref 70–99)

## 2022-04-30 NOTE — ED Notes (Signed)
There are no placement updates at this time. TOC to continue sending referral to possible placements for review. TOC to follow.

## 2022-04-30 NOTE — ED Notes (Signed)
Patient refused meal tray. Took a few sips of juice.

## 2022-04-30 NOTE — ED Notes (Signed)
Attempted to give patient his medication in applesauce. Patient took a bite then spit it out. Refuses to try again, refuses fluids.

## 2022-04-30 NOTE — ED Notes (Signed)
Patient repositioned in bed. Offered patient fluids. Patient refused.

## 2022-04-30 NOTE — ED Notes (Signed)
Offered patient fluids / food. Patient refuses. Pt is siting up in bed in NAD . VSS

## 2022-04-30 NOTE — ED Notes (Signed)
Patient taking medication with applesauce and small sips of soda

## 2022-04-30 NOTE — ED Notes (Signed)
Attempted to give pt meal tray. Pt refused and moved spoon with food and drink away.

## 2022-04-30 NOTE — ED Provider Notes (Signed)
Emergency Medicine Observation Re-evaluation Note  Garrett [name] is a 37 y.o. male, seen on rounds today.  Pt initially presented to the ED for complaints of Failure To Thrive Currently, the patient is awaiting placement, getting antibiotics for UTI.  Physical Exam  BP (!) 119/98   Pulse 87   Temp 98.3 F (36.8 C) (Axillary)   Resp 15   SpO2 98%  Physical Exam General: No acute distress, resting in bed Cardiac: No tachycardia, normal rhythm Lungs: No distress, oxygen of 98% on room air Psych: Calm, compliant with treatment plan, no internal stimuli  ED Course / MDM  EKG:   I have reviewed the labs performed to date as well as medications administered while in observation.  Recent changes in the last 24 hours include antibiotics were changed to clindamycin, currently getting appropriate antibiotics based on recent urinary culture.  Plan  Current plan is for placement in nursing facility The patient has been taking solid and clear liquids, he does not eat very much but he is willing to take this when encouraged.Noemi Chapel, MD 04/30/22 0900

## 2022-04-30 NOTE — ED Notes (Signed)
Pt given emesis bag, pt appears to be nauseated, pt has not vomited. Peppermint essential oil applied to guaze and rubbed on pt neck to ease nausea, pt will not take meds, drink, or eat everytime nurse has offered during shift.

## 2022-05-01 LAB — CBG MONITORING, ED
Glucose-Capillary: 116 mg/dL — ABNORMAL HIGH (ref 70–99)
Glucose-Capillary: 92 mg/dL (ref 70–99)
Glucose-Capillary: 91 mg/dL (ref 70–99)

## 2022-05-01 NOTE — ED Notes (Signed)
Pt observed spitting food out of mouth and pocketing capsules. Pt then spit capsules out. Interpreter ipad unable to connect to a translator that can communicate with the pt in his language.

## 2022-05-01 NOTE — ED Notes (Signed)
CSW reached out to pts RN at 1:30 to see if pt has eaten today as there have been no notes in pts chart since 5:48am. CSW did not receive response. Per chart it is noted that pt continues to pocket medications and spit them out. Also, there is not much documentation of pt eating. CSW has facility willing to review pt for possible placement. However, prior to this it must be confirmed that pt is eating, drinking and taking medications. TOC to follow.

## 2022-05-01 NOTE — ED Notes (Signed)
Pt continues to pocket applesauce and medications then spit them out on the floor.

## 2022-05-01 NOTE — ED Notes (Signed)
Pt did not have a BM in bed pan. Pt's brief changed and warm blanket given. Pt resting at this time.

## 2022-05-01 NOTE — ED Notes (Addendum)
Pt able to eat a cup of applesauce being spoon fed by staff. Pt also assisted RN with placing him on a bedpan  Using a Karen interrupter pt states that he has to have a BM and that he has no other needs at this time.

## 2022-05-02 LAB — CBG MONITORING, ED
Glucose-Capillary: 90 mg/dL (ref 70–99)
Glucose-Capillary: 82 mg/dL (ref 70–99)

## 2022-05-02 MED ORDER — POLYETHYLENE GLYCOL 3350 17 G PO PACK
17.0000 g | PACK | Freq: Every day | ORAL | Status: DC
  Administered 2022-05-02 – 2022-05-17 (×14): 17 g via ORAL
  Filled 2022-05-02 (×16): qty 1

## 2022-05-02 NOTE — ED Notes (Signed)
Pt refusing to open his mouth to take pills in applesauce

## 2022-05-02 NOTE — ED Notes (Signed)
CSW reached out to MD to inquire as to if pt would benefit from psych and/or palliative consult as pt is not eating, drinking, or taking his meds. Placements are concerned about this behavior.

## 2022-05-02 NOTE — ED Notes (Signed)
Pt is still refusing lab work so lab work has been discontinued. EDP aware.

## 2022-05-02 NOTE — ED Notes (Signed)
Pt was able to eat half a cup of apple sauce with staff spoon feeding.

## 2022-05-02 NOTE — ED Provider Notes (Signed)
Emergency Medicine Observation Re-evaluation Note  Garrett [name] is a 37 y.o. male, seen on rounds today.  Pt initially presented to the ED for complaints of Failure To Thrive Currently, the patient is staying in bed.  Physical Exam  BP 117/88 (BP Location: Right Arm)   Pulse 87   Temp 98.1 F (36.7 C) (Oral)   Resp 15   SpO2 98%  Physical Exam General: Awake, alert, slowed responses Cardiac: Extremities well-perfused, normal heart rate Lungs: Breathing is unlabored Psych: Flat affect  ED Course / MDM  EKG:   I have reviewed the labs performed to date as well as medications administered while in observation.  Recent changes in the last 24 hours include none.  Plan  Current plan is for nursing facility placement.    Godfrey Pick, MD 05/02/22 [phone]

## 2022-05-02 NOTE — ED Notes (Signed)
Pt was given breakfast 1 hour ago and has not eaten. Applesauce was also seen to have been spit into the floor with what appeared to be medicine in it. Pt has also not had anything to drink of the miralax that was given earlier.

## 2022-05-02 NOTE — ED Notes (Signed)
Pt refusing to have blood work drawn. Phlebotomist and RN attempted to speak with pt about having blood work drawn. He stated "no" and then shook his head no. When attempting to speak with him about the importance of it, pt just stared up at staff and stopped talking.

## 2022-05-02 NOTE — ED Notes (Signed)
Attempted to give pt his meds with pudding, but he states (I do nothing) and turns mouth away from me.

## 2022-05-03 DIAGNOSIS — R627 Adult failure to thrive: Secondary | ICD-10-CM | POA: Diagnosis not present

## 2022-05-03 LAB — COMPREHENSIVE METABOLIC PANEL
ALT: 55 U/L — ABNORMAL HIGH (ref 0–44)
AST: 89 U/L — ABNORMAL HIGH (ref 15–41)
Albumin: 3 g/dL — ABNORMAL LOW (ref 3.5–5.0)
Alkaline Phosphatase: 57 U/L (ref 38–126)
Anion gap: 14 (ref 5–15)
BUN: 27 mg/dL — ABNORMAL HIGH (ref 6–20)
CO2: 23 mmol/L (ref 22–32)
Calcium: 8.6 mg/dL — ABNORMAL LOW (ref 8.9–10.3)
Chloride: 110 mmol/L (ref 98–111)
Creatinine, Ser: 0.81 mg/dL (ref 0.61–1.24)
GFR, Estimated: >60 mL/min (ref >60)
Glucose, Bld: 96 mg/dL (ref 70–99)
Potassium: 4 mmol/L (ref 3.5–5.1)
Sodium: 147 mmol/L — ABNORMAL HIGH (ref 135–145)
Total Bilirubin: 1.9 mg/dL — ABNORMAL HIGH (ref 0.3–1.2)
Total Protein: 8.9 g/dL — ABNORMAL HIGH (ref 6.5–8.1)

## 2022-05-03 LAB — CBC WITH DIFFERENTIAL/PLATELET
Abs Immature Granulocytes: 0.02 10*3/uL (ref 0.00–0.07)
Basophils Absolute: 0 10*3/uL (ref 0.0–0.1)
Basophils Relative: 0 %
Eosinophils Absolute: 0.1 10*3/uL (ref 0.0–0.5)
Eosinophils Relative: 1 %
HCT: 41 % (ref 39.0–52.0)
Hemoglobin: 13 g/dL (ref 13.0–17.0)
Immature Granulocytes: 0 %
Lymphocytes Relative: 34 %
Lymphs Abs: 2.3 10*3/uL (ref 0.7–4.0)
MCH: 31 pg (ref 26.0–34.0)
MCHC: 31.7 g/dL (ref 30.0–36.0)
MCV: 97.6 fL (ref 80.0–100.0)
Monocytes Absolute: 0.5 10*3/uL (ref 0.1–1.0)
Monocytes Relative: 7 %
Neutro Abs: 3.9 10*3/uL (ref 1.7–7.7)
Neutrophils Relative %: 58 %
Platelets: 162 10*3/uL (ref 150–400)
RBC: 4.2 MIL/uL — ABNORMAL LOW (ref 4.22–5.81)
RDW: 14.7 % (ref 11.5–15.5)
WBC: 6.8 10*3/uL (ref 4.0–10.5)
nRBC: 0 % (ref 0.0–0.2)

## 2022-05-03 LAB — CBG MONITORING, ED
Glucose-Capillary: 88 mg/dL (ref 70–99)
Glucose-Capillary: 141 mg/dL — ABNORMAL HIGH (ref 70–99)
Glucose-Capillary: 124 mg/dL — ABNORMAL HIGH (ref 70–99)

## 2022-05-03 MED ORDER — ENSURE MAX PROTEIN PO LIQD
11.0000 [oz_av] | Freq: Two times a day (BID) | ORAL | Status: DC
  Administered 2022-05-04 – 2022-05-16 (×20): 11 [oz_av] via ORAL
  Administered 2022-05-16: 8 [oz_av] via ORAL
  Administered 2022-05-17 (×2): 11 [oz_av] via ORAL
  Filled 2022-05-03 (×43): qty 330

## 2022-05-03 MED ORDER — SODIUM CHLORIDE 0.9 % IV BOLUS
1000.0000 mL | Freq: Once | INTRAVENOUS | Status: DC
  Administered 2022-05-03: 1000 mL via INTRAVENOUS

## 2022-05-03 NOTE — ED Notes (Signed)
Pt has been up in recliner and watching the activities in the ED.  He has has po intake, especially seeming to enjoy anything sweet, apple sauce, fruit, ice cream, cookies and ensure.

## 2022-05-03 NOTE — ED Notes (Signed)
Pt was given ensure max protein mixed with ice cream at this time.

## 2022-05-03 NOTE — ED Notes (Addendum)
Pt tells me "hard poop" and seems to want to get up.  Pt helped into WC and then to bathroom where he was helped to transfer to toilet.  Pt had BM, changed depend and helped pt back to stretcher

## 2022-05-03 NOTE — ED Notes (Signed)
Flushed pt IV with 10cc NS

## 2022-05-03 NOTE — ED Notes (Signed)
Pt given meal tray, not interested in food but as he seems to like sweets he is given cookies and ice cream.  Pt ate all of the ice cream and is eating the cookies.

## 2022-05-03 NOTE — ED Notes (Signed)
Pt given meal tray, he ate the fruit.  Given him fruit from extra meal tray as he seems to like sweet things, also gave him orange juice and grape juice which he is drinking

## 2022-05-03 NOTE — ED Notes (Signed)
PT at bedside.

## 2022-05-03 NOTE — ED Notes (Signed)
Pt being TTS at this time  

## 2022-05-03 NOTE — ED Provider Notes (Signed)
Emergency Medicine Observation Re-evaluation Note  Garrett [name] is a 37 y.o. male, seen on rounds today.  Pt initially presented to the ED for complaints of Failure To Thrive Currently, the patient is awake and alert.  Pt has been here since 1/25 for FTT.  He has a foley catheter and had a urine culture which grew out mrsa.  He was getting keflex, but that was changed to clinda.  Nurses report pt has had very little oral intake.  He has a dinner tray this am at bedside that has not been touched.  I used a [name] interpreter and the interpreter could not understand what he was saying as he was whispering very quietly.  TTS consult placed yesterday.  This has not yet been completed.  Physical Exam  BP 104/81   Pulse (!) 105   Temp (!) 97.5 F (36.4 C) (Oral)   Resp 16   SpO2 100%  Physical Exam General: awake and alert, cachectic Cardiac: rr Lungs: clear Psych: not eating/drinking  ED Course / MDM  EKG:   I have reviewed the labs performed to date as well as medications administered while in observation.  Recent changes in the last 24 hours include poor intake.  Plan  Current plan is for SNF vs psych.  I have consulted PT and have added on IVFs and labs.  CBC nl, cmp with mild hypernatremia with na 147, lfts have remained elevated for weeks.  US done on 12/2 showed no cholecystitis.  CT abd/pelvis on 11/27 showed cystitis, but no liver abn.  Per notes on last admission, this elevation is thought to be due to chronic hep b.  Pt will be encouraged to eat and to get up with PT.  TTS consult pending.    Isla Pence, MD 05/03/22 [phone]

## 2022-05-03 NOTE — ED Notes (Signed)
Per PT, pt was able to ambulate several feet with a walker with assistance

## 2022-05-03 NOTE — Plan of Care (Signed)
  Problem: Acute Rehab PT Goals(only PT should resolve) Goal: Pt Will Go Supine/Side To Sit Outcome: Progressing Flowsheets (Taken 05/03/2022 1146) Pt will go Supine/Side to Sit:  with minimal assist  with min guard assist Goal: Patient Will Transfer Sit To/From Stand Outcome: Progressing Flowsheets (Taken 05/03/2022 1146) Patient will transfer sit to/from stand: with minimal assist Goal: Pt Will Transfer Bed To Chair/Chair To Bed Outcome: Progressing Flowsheets (Taken 05/03/2022 1146) Pt will Transfer Bed to Chair/Chair to Bed: with min assist Goal: Pt Will Ambulate Outcome: Progressing Flowsheets (Taken 05/03/2022 1146) Pt will Ambulate:  75 feet  with minimal assist  with rolling walker   11:47 AM, 05/03/22 Lonell Grandchild, MPT Physical Therapist with [facility] [phone] office [phone] mobile phone

## 2022-05-03 NOTE — ED Notes (Signed)
Pt has meal tray and drank orange juice, no food eaten.  2 suckers given as pt seems to like sweets, pt consumed both.

## 2022-05-03 NOTE — ED Notes (Signed)
CSW notes PT recommendations for SNF. CSW completed updated Fl2 and requested ED Attending cosign. CSW sent pts SNF referral out to all surrounding facilities both in and out of county. TOC to follow.

## 2022-05-03 NOTE — [place] FL2 (Signed)
[place] MEDICAID FL2 LEVEL OF CARE FORM     IDENTIFICATION  Patient Name: Garrett [name] Birthdate: 1985-05-18 Sex: male Admission Date (Current Location): 04/27/2022  [facility] and [place] Number:  [employer] and Address:  [facility] [address] [place]      Provider Number: [id number]  Attending Physician Name and Address:  Default, Provider, MD  Relative Name and Phone Number:       Current Level of Care: Hospital Recommended Level of Care: [place] Prior Approval Number:    Date Approved/Denied:   PASRR Number:    Discharge Plan: SNF    Current Diagnoses: Patient Active Problem List   Diagnosis Date Noted   Acute cystitis without hematuria 03/10/2022   Intractable nausea and vomiting 03/10/2022   Hypotension 01/26/2022   Hypoalbuminemia 01/24/2022   Hyponatremia 01/24/2022   Postoperative intra-abdominal abscess 01/20/2022   Severe malnutrition ([place]) 01/18/2022   Chronic hepatitis B without hepatic coma ([place]) 01/14/2022   Transaminitis 09/26/2021   Constipation 06/29/2021   Peripheral neuropathy 06/28/2021   Generalized weakness    Low back pain 05/24/2021   Major depressive disorder, recurrent episode, moderate with mood-congruent psychotic features ([place])    Chronic Hepatitis B 05/12/2021   Physical deconditioning    Anemia 05/05/2021   Altered mental status    Controlled type 2 diabetes mellitus without complication, with long-term current use of insulin ([place]) 32/20/2542   Acute metabolic encephalopathy 70/62/3762   Elevated LFTs 03/09/2020   Major depressive disorder, recurrent severe without psychotic features ([place]) 06/20/2017   Depression, major, recurrent, severe with psychosis ([place]) 06/19/2017    Orientation RESPIRATION BLADDER Height & Weight     Self, Situation, Place  Normal Continent, Indwelling catheter Weight:   Height:     BEHAVIORAL SYMPTOMS/MOOD NEUROLOGICAL BOWEL NUTRITION STATUS       Continent Diet  AMBULATORY STATUS COMMUNICATION OF NEEDS Skin   Extensive Assist Verbally Normal                       Personal Care Assistance Level of Assistance  Bathing, Feeding, Dressing Bathing Assistance: Maximum assistance Feeding assistance: Limited assistance Dressing Assistance: Maximum assistance     Functional Limitations Info  Sight, Hearing, Speech Sight Info: Adequate Hearing Info: Adequate Speech Info: Adequate    SPECIAL CARE FACTORS FREQUENCY  PT (By licensed PT), OT (By licensed OT)     PT Frequency: 5 times weekly OT Frequency: 5 times weekly            Contractures Contractures Info: Not present    Additional Factors Info  Code Status, Allergies Code Status Info: FULL Allergies Info: NKA Psychotropic Info: Lexapro and ativan         Current Medications (05/03/2022):  This is the current hospital active medication list Current Facility-Administered Medications  Medication Dose Route Frequency Provider Last Rate Last Admin   clindamycin (CLEOCIN) capsule 300 mg  300 mg Oral Q6H Noemi Chapel, MD   300 mg at 05/03/22 1101   polyethylene glycol (MIRALAX / GLYCOLAX) packet 17 g  17 g Oral Daily Godfrey Pick, MD   17 g at 05/03/22 1101   Current Outpatient Medications  Medication Sig Dispense Refill   acetaminophen (TYLENOL) 325 MG tablet Take 2 tablets (650 mg total) by mouth every 6 (six) hours as needed for mild pain or moderate pain. 7 tablet 0   ANTI-DIARRHEAL 2 MG tablet Take 2 mg by mouth  as needed for diarrhea or loose stools.     benztropine (COGENTIN) 0.5 MG tablet Take 1 tablet (0.5 mg total) by mouth 2 (two) times daily. (Patient taking differently: Take 0.5 mg by mouth at bedtime.) 7 tablet 0   clindamycin (CLEOCIN) 150 MG capsule Take 2 capsules (300 mg total) by mouth 4 (four) times daily for 7 days. May dispense as 150mg  capsules 56 capsule 0   escitalopram (LEXAPRO) 10 MG tablet Take 1 tablet (10 mg total) by mouth daily. 7  tablet 0   gabapentin (NEURONTIN) 100 MG capsule Take 1 capsule (100 mg total) by mouth 2 (two) times daily. 14 capsule 0   glipiZIDE (GLUCOTROL) 5 MG tablet Take 5 mg by mouth daily before breakfast.     insulin glargine (LANTUS) 100 UNIT/ML Solostar Pen Inject 10 Units into the skin daily. 3 mL 3   midodrine (PROAMATINE) 10 MG tablet Take 1 tablet (10 mg total) by mouth 3 (three) times daily with meals. 90 tablet 0   promethazine (PHENERGAN) 25 MG suppository Place 1 suppository (25 mg total) rectally every 8 (eight) hours as needed for refractory nausea / vomiting. 20 each 0   tamsulosin (FLOMAX) 0.4 MG CAPS capsule Take 1 capsule (0.4 mg total) by mouth daily. 30 capsule 11   Accu-Chek Softclix Lancets lancets Use to test blood sugars up to 4 times daily as directed. 100 each 0   Blood Glucose Monitoring Suppl (BLOOD GLUCOSE MONITOR SYSTEM) w/Device KIT Use to test blood sugars up to 4 times daily as directed. 1 kit 0   feeding supplement, GLUCERNA SHAKE, (GLUCERNA SHAKE) LIQD Take 237 mLs by mouth 3 (three) times daily between meals. (Patient not taking: Reported on 04/27/2022) 237 mL 0   glucose blood (ACCU-CHEK GUIDE) test strip Use to check blood sugars up to 4 times daily as directed. 100 each 0   insulin aspart (NOVOLOG) 100 UNIT/ML FlexPen Inject 0-6 Units into the skin 3 (three) times daily with meals. (Patient not taking: Reported on 04/27/2022) 3 mL 3   Insulin Pen Needle 32G X 4 MM MISC Use to inject insulin up to 4 times daily. 100 each 0   LORazepam (ATIVAN) 0.5 MG tablet Take 1 tablet (0.5 mg total) by mouth at bedtime. (Patient not taking: Reported on 04/27/2022) 7 tablet 0   miconazole nitrate (MICATIN) POWD Apply 1 application. topically 3 (three) times daily. 5 g 0   ondansetron (ZOFRAN-ODT) 4 MG disintegrating tablet Take 1 tablet (4 mg total) by mouth every 8 (eight) hours as needed for up to 15 doses for nausea or vomiting. (Patient not taking: Reported on 04/27/2022) 15 tablet 0      Discharge Medications: Please see discharge summary for a list of discharge medications.  Relevant Imaging Results:  Relevant Lab Results:   Additional Information SSN: [id number], [place]

## 2022-05-03 NOTE — Evaluation (Signed)
Physical Therapy Evaluation Patient Details Name: Garrett [name] MRN: [id number] DOB: 1985-09-06 Today's Date: 05/03/2022  History of Present Illness  Garrett [name] is a 37 y.o. male.     HPI      37 y/o male - very unfortunate situation - he is in a NH - due to ruptured appendicitis / s/p ex lap - I believe he is a ward of the state, he has been seen in this emergency department multiple times for failure to thrive, I have seen him personally several times, he was admitted in December 2023 with acute cystitis and transaminitis with severe malnutrition, he continues to have ongoing failure to thrive and refuses to eat at his nursing facility.  He was sent in because of what appeared to be cloudy appearing urine in his Foley catheter.  There is no abnormal vital signs.  The patient cannot give any information, we tried to use the interpreter for his language and he cannot speak to them   Clinical Impression  Patient demonstrates slow labored movement for sitting up at bedside, very unsteady on feet and unable to maintain standing balance without an AD, required use of RW for completing transfer to chair, walking out side of room with slow labored movement and limited mostly due to c/o fatigue.  Patient tolerated sitting up in chair after therapy - nursing staff notified.  Patient will benefit from continued skilled physical therapy in hospital and recommended venue below to increase strength, balance, endurance for safe ADLs and gait.          Recommendations for follow up therapy are one component of a multi-disciplinary discharge planning process, led by the attending physician.  Recommendations may be updated based on patient status, additional functional criteria and insurance authorization.  Follow Up Recommendations Skilled nursing-short term rehab (<3 hours/day) Can patient physically be transported by private vehicle: Yes    Assistance Recommended at Discharge Intermittent Supervision/Assistance   Patient can return home with the following  A lot of help with walking and/or transfers;A lot of help with bathing/dressing/bathroom;Assistance with cooking/housework;Help with stairs or ramp for entrance    Equipment Recommendations None recommended by PT  Recommendations for Other Services       Functional Status Assessment Patient has had a recent decline in their functional status and demonstrates the ability to make significant improvements in function in a reasonable and predictable amount of time.     Precautions / Restrictions Precautions Precautions: Fall Restrictions Weight Bearing Restrictions: No      Mobility  Bed Mobility Overal bed mobility: Needs Assistance Bed Mobility: Supine to Sit     Supine to sit: Min assist, Mod assist     General bed mobility comments: increased time, labored movement    Transfers Overall transfer level: Needs assistance Equipment used: Rolling walker (2 wheels) Transfers: Sit to/from Stand, Bed to chair/wheelchair/BSC Sit to Stand: Min assist, Mod assist   Step pivot transfers: Min assist, Mod assist       General transfer comment: unsteady on feet requiring use of RW for safety    Ambulation/Gait Ambulation/Gait assistance: Min assist, Mod assist Gait Distance (Feet): 30 Feet Assistive device: Rolling walker (2 wheels) Gait Pattern/deviations: Decreased step length - right, Decreased step length - left, Decreased stride length, Knees buckling Gait velocity: decreased     General Gait Details: slow labored cadence with occasional buckling of knees due to weakness and limited mostly due to c/o fatigue  Stairs  Wheelchair Mobility    Modified Rankin (Stroke Patients Only)       Balance Overall balance assessment: Needs assistance Sitting-balance support: Feet supported, No upper extremity supported Sitting balance-Leahy Scale: Fair Sitting balance - Comments: seated at EOB   Standing balance  support: During functional activity, No upper extremity supported Standing balance-Leahy Scale: Poor Standing balance comment: fair/poor using RW                             Pertinent Vitals/Pain Pain Assessment Pain Assessment: Faces Faces Pain Scale: No hurt    Home Living Family/patient expects to be discharged to:: Group home                        Prior Function Prior Level of Function : Needs assist       Physical Assist : Mobility (physical);ADLs (physical) Mobility (physical): Bed mobility;Transfers;Gait;Stairs   Mobility Comments: household ambulator without AD ADLs Comments: Assisted by group home staff     Hand Dominance        Extremity/Trunk Assessment   Upper Extremity Assessment Upper Extremity Assessment: Generalized weakness;Difficult to assess due to impaired cognition    Lower Extremity Assessment Lower Extremity Assessment: Generalized weakness    Cervical / Trunk Assessment Cervical / Trunk Assessment: Normal  Communication   Communication: Expressive difficulties;Other (comment) (Patient able to follow verbal directions, but mostly non-verbal)  Cognition Arousal/Alertness: Awake/alert Behavior During Therapy: Flat affect Overall Cognitive Status: Within Functional Limits for tasks assessed                                          General Comments      Exercises     Assessment/Plan    PT Assessment Patient needs continued PT services  PT Problem List Decreased strength;Decreased activity tolerance;Decreased balance;Decreased mobility       PT Treatment Interventions DME instruction;Gait training;Stair training;Functional mobility training;Therapeutic activities;Therapeutic exercise;Balance training;Patient/family education    PT Goals (Current goals can be found in the Care Plan section)  Acute Rehab PT Goals Patient Stated Goal: not stated PT Goal Formulation: With patient Time For Goal  Achievement: 05/17/22 Potential to Achieve Goals: Good    Frequency Min 2X/week     Co-evaluation               AM-PAC PT "6 Clicks" Mobility  Outcome Measure Help needed turning from your back to your side while in a flat bed without using bedrails?: A Little Help needed moving from lying on your back to sitting on the side of a flat bed without using bedrails?: A Little Help needed moving to and from a bed to a chair (including a wheelchair)?: A Little Help needed standing up from a chair using your arms (e.g., wheelchair or bedside chair)?: A Lot Help needed to walk in hospital room?: A Lot Help needed climbing 3-5 steps with a railing? : A Lot 6 Click Score: 15    End of Session   Activity Tolerance: Patient tolerated treatment well;Patient limited by fatigue Patient left: in chair;with call bell/phone within reach Nurse Communication: Mobility status PT Visit Diagnosis: Unsteadiness on feet (R26.81);Other abnormalities of gait and mobility (R26.89);Muscle weakness (generalized) (M62.81)    Time: 8416-6063 PT Time Calculation (min) (ACUTE ONLY): 30 min   Charges:   PT Evaluation $PT  Eval Moderate Complexity: 1 Mod PT Treatments $Therapeutic Activity: 23-37 mins        11:45 AM, 05/03/22 Lonell Grandchild, MPT Physical Therapist with [facility] [phone] office [phone] mobile phone

## 2022-05-03 NOTE — Consult Note (Signed)
Telepsych Consultation   Reason for Consult:  "Concerns of Si. Refusing food and meds." Referring Physician:   Alycia Rossetti Dixion MD Location of Patient:  [facility] Location of Provider: [facility]  Patient Identification: Garrett [name] MRN:  [id number] Principal Diagnosis: Failure to Thrive Diagnosis: Failure to thrive   Total Time spent with patient: 15 minutes  Subjective:   Garrett [name] is a 37 y.o. male seen and evaluated via teleassessment.  Translation provided by Noni Saupe. [name] interpretation was provided.  RN at bedside during this assessment. Consult was placed for "suicidal ideation. Refusing food and medications."  Patient observed resting in bed.  He presents flat, guarded but pleasant.  Denying suicidal or homicidal ideations.  Denies auditory visual hallucinations.  Patient responses are short and abrupt. Denies that he is followed by therapy and/or psychiatry denies that he is prescribed any psychotropic tropic medications. He denied previous inpatient admissions.  Unable to recall the reason for his admission. Patient has a charted history with depression and is currently prescribed gabapentin 200 mg BID, Lexapro 10 mg and ativan 0.5 mg Reports he is originally from from [place] however  is unable to reach call how long he has been in the [place]. Patient reported he reside alone with no additional family support. He denies depression or depressive symptoms.  States that he has had stomach pain for the past 3 days he has been unable to eat. Denied illicit drug use or substance abuse history.   Garrett [name] is resting in bed in no acute distress. he is alert/oriented x to self only; calm/cooperative; and mood congruent with affect. he is speaking in a clear tone at moderate volume, and normal pace; with fair eye contact.  There is no indication that he is currently responding to internal/external stimuli or experiencing delusional thought content; and she has denied  suicidal/self-harm/homicidal ideation, psychosis, and paranoia. Patient has remained calm throughout assessment.    HPI: Per initial admission assessment note: "37 y/o male - very unfortunate situation - he is in a NH - due to ruptured appendicitis / s/p ex lap - I believe he is a ward of the state, he has been seen in this emergency department multiple times for failure to thrive, I have seen him personally several times, he was admitted in December 2023 with acute cystitis and transaminitis with severe malnutrition, he continues to have ongoing failure to thrive and refuses to eat at his nursing facility.  He was sent in because of what appeared to be cloudy appearing urine in his Foley catheter.  There is no abnormal vital signs.  The patient cannot give any information, we tried to use the interpreter for his language and he cannot speak to them."   Past Psychiatric History:   Risk to Self:   Risk to Others:   Prior Inpatient Therapy:   Prior Outpatient Therapy:    Past Medical History:  Past Medical History:  Diagnosis Date   Acute metabolic encephalopathy 03/09/2020   Altered mental status    Anemia 05/05/2021   Chronic Hepatitis B 05/12/2021   Chronic hepatitis B without hepatic coma (HCC) 01/14/2022   Depression    Diabetes mellitus (HCC)    Elevated LFTs 03/09/2020   Hypoalbuminemia 01/24/2022   Hyponatremia 01/24/2022   Hypotension 01/26/2022   Major depressive disorder, recurrent episode, moderate with mood-congruent psychotic features (HCC)    Major depressive disorder, recurrent severe without psychotic features (HCC) 06/20/2017   Physical deconditioning    Postoperative intra-abdominal  abscess 01/20/2022   Severe malnutrition ([place]) 01/18/2022   Severe malnutrition ([place]) 01/18/2022    Past Surgical History:  Procedure Laterality Date   APPENDECTOMY     EXPLORATORY LAPAROTOMY     Family History: History reviewed. No pertinent family history. Family Psychiatric   History:  Social History:  Social History   Substance and Sexual Activity  Alcohol Use Not Currently   Comment: occassionally     Social History   Substance and Sexual Activity  Drug Use Not Currently    Social History   Socioeconomic History   Marital status: Single    Spouse name: Not on file   Number of children: Not on file   Years of education: Not on file   Highest education level: Not on file  Occupational History   Not on file  Tobacco Use   Smoking status: Former    Types: Cigarettes   Smokeless tobacco: Never  Substance and Sexual Activity   Alcohol use: Not Currently    Comment: occassionally   Drug use: Not Currently   Sexual activity: Not on file  Other Topics Concern   Not on file  Social History Narrative   Not on file   Social Determinants of Health   Financial Resource Strain: Not on file  Food Insecurity: Not on file  Transportation Needs: Not on file  Physical Activity: Not on file  Stress: Not on file  Social Connections: Not on file   Additional Social History:    Allergies:  No Known Allergies  Labs:  Results for orders placed or performed during the hospital encounter of 04/27/22 (from the past 48 hour(s))  CBG monitoring, ED     Status: None   Collection Time: 05/01/22  3:13 PM  Result Value Ref Range   Glucose-Capillary 92 70 - 99 mg/dL    Comment: Glucose reference range applies only to samples taken after fasting for at least 8 hours.  CBG monitoring, ED     Status: None   Collection Time: 05/01/22  7:42 PM  Result Value Ref Range   Glucose-Capillary 91 70 - 99 mg/dL    Comment: Glucose reference range applies only to samples taken after fasting for at least 8 hours.  CBG monitoring, ED     Status: None   Collection Time: 05/02/22 10:27 AM  Result Value Ref Range   Glucose-Capillary 90 70 - 99 mg/dL    Comment: Glucose reference range applies only to samples taken after fasting for at least 8 hours.  CBG monitoring, ED      Status: None   Collection Time: 05/02/22 11:12 PM  Result Value Ref Range   Glucose-Capillary 82 70 - 99 mg/dL    Comment: Glucose reference range applies only to samples taken after fasting for at least 8 hours.  CBG monitoring, ED     Status: None   Collection Time: 05/03/22  7:38 AM  Result Value Ref Range   Glucose-Capillary 88 70 - 99 mg/dL    Comment: Glucose reference range applies only to samples taken after fasting for at least 8 hours.  Comprehensive metabolic panel     Status: Abnormal   Collection Time: 05/03/22  7:39 AM  Result Value Ref Range   Sodium 147 (H) 135 - 145 mmol/L   Potassium 4.0 3.5 - 5.1 mmol/L   Chloride 110 98 - 111 mmol/L   CO2 23 22 - 32 mmol/L   Glucose, Bld 96 70 - 99 mg/dL  Comment: Glucose reference range applies only to samples taken after fasting for at least 8 hours.   BUN 27 (H) 6 - 20 mg/dL   Creatinine, Ser 7.25 0.61 - 1.24 mg/dL   Calcium 8.6 (L) 8.9 - 10.3 mg/dL   Total Protein 8.9 (H) 6.5 - 8.1 g/dL   Albumin 3.0 (L) 3.5 - 5.0 g/dL   AST 89 (H) 15 - 41 U/L   ALT 55 (H) 0 - 44 U/L   Alkaline Phosphatase 57 38 - 126 U/L   Total Bilirubin 1.9 (H) 0.3 - 1.2 mg/dL   GFR, Estimated >36 >64 mL/min    Comment: (NOTE) Calculated using the CKD-EPI Creatinine Equation (2021)    Anion gap 14 5 - 15    Comment: Performed at [facility], [address], [place], [place] [address]  CBC with Differential     Status: Abnormal   Collection Time: 05/03/22  7:39 AM  Result Value Ref Range   WBC 6.8 4.0 - 10.5 K/uL   RBC 4.20 (L) 4.22 - 5.81 MIL/uL   Hemoglobin 13.0 13.0 - 17.0 g/dL   HCT 42.5 95.6 - 38.7 %   MCV 97.6 80.0 - 100.0 fL   MCH 31.0 26.0 - 34.0 pg   MCHC 31.7 30.0 - 36.0 g/dL   RDW 56.4 33.2 - 95.1 %   Platelets 162 150 - 400 K/uL   nRBC 0.0 0.0 - 0.2 %   Neutrophils Relative % 58 %   Neutro Abs 3.9 1.7 - 7.7 K/uL   Lymphocytes Relative 34 %   Lymphs Abs 2.3 0.7 - 4.0 K/uL   Monocytes Relative 7 %   Monocytes Absolute 0.5 0.1 -  1.0 K/uL   Eosinophils Relative 1 %   Eosinophils Absolute 0.1 0.0 - 0.5 K/uL   Basophils Relative 0 %   Basophils Absolute 0.0 0.0 - 0.1 K/uL   Immature Granulocytes 0 %   Abs Immature Granulocytes 0.02 0.00 - 0.07 K/uL    Comment: Performed at [facility], [address] [place], [place] [address]    Medications:  Current Facility-Administered Medications  Medication Dose Route Frequency Provider Last Rate Last Admin   clindamycin (CLEOCIN) capsule 300 mg  300 mg Oral Q6H Eber Hong, MD   300 mg at 05/02/22 0556   polyethylene glycol (MIRALAX / GLYCOLAX) packet 17 g  17 g Oral Daily Gloris Manchester, MD   17 g at 05/02/22 0807   Current Outpatient Medications  Medication Sig Dispense Refill   acetaminophen (TYLENOL) 325 MG tablet Take 2 tablets (650 mg total) by mouth every 6 (six) hours as needed for mild pain or moderate pain. 7 tablet 0   ANTI-DIARRHEAL 2 MG tablet Take 2 mg by mouth as needed for diarrhea or loose stools.     benztropine (COGENTIN) 0.5 MG tablet Take 1 tablet (0.5 mg total) by mouth 2 (two) times daily. (Patient taking differently: Take 0.5 mg by mouth at bedtime.) 7 tablet 0   clindamycin (CLEOCIN) 150 MG capsule Take 2 capsules (300 mg total) by mouth 4 (four) times daily for 7 days. May dispense as 150mg  capsules 56 capsule 0   escitalopram (LEXAPRO) 10 MG tablet Take 1 tablet (10 mg total) by mouth daily. 7 tablet 0   gabapentin (NEURONTIN) 100 MG capsule Take 1 capsule (100 mg total) by mouth 2 (two) times daily. 14 capsule 0   glipiZIDE (GLUCOTROL) 5 MG tablet Take 5 mg by mouth daily before breakfast.  insulin glargine (LANTUS) 100 UNIT/ML Solostar Pen Inject 10 Units into the skin daily. 3 mL 3   midodrine (PROAMATINE) 10 MG tablet Take 1 tablet (10 mg total) by mouth 3 (three) times daily with meals. 90 tablet 0   promethazine (PHENERGAN) 25 MG suppository Place 1 suppository (25 mg total) rectally every 8 (eight) hours as needed for refractory nausea /  vomiting. 20 each 0   tamsulosin (FLOMAX) 0.4 MG CAPS capsule Take 1 capsule (0.4 mg total) by mouth daily. 30 capsule 11   Accu-Chek Softclix Lancets lancets Use to test blood sugars up to 4 times daily as directed. 100 each 0   Blood Glucose Monitoring Suppl (BLOOD GLUCOSE MONITOR SYSTEM) w/Device KIT Use to test blood sugars up to 4 times daily as directed. 1 kit 0   feeding supplement, GLUCERNA SHAKE, (GLUCERNA SHAKE) LIQD Take 237 mLs by mouth 3 (three) times daily between meals. (Patient not taking: Reported on 04/27/2022) 237 mL 0   glucose blood (ACCU-CHEK GUIDE) test strip Use to check blood sugars up to 4 times daily as directed. 100 each 0   insulin aspart (NOVOLOG) 100 UNIT/ML FlexPen Inject 0-6 Units into the skin 3 (three) times daily with meals. (Patient not taking: Reported on 04/27/2022) 3 mL 3   Insulin Pen Needle 32G X 4 MM MISC Use to inject insulin up to 4 times daily. 100 each 0   LORazepam (ATIVAN) 0.5 MG tablet Take 1 tablet (0.5 mg total) by mouth at bedtime. (Patient not taking: Reported on 04/27/2022) 7 tablet 0   miconazole nitrate (MICATIN) POWD Apply 1 application. topically 3 (three) times daily. 5 g 0   ondansetron (ZOFRAN-ODT) 4 MG disintegrating tablet Take 1 tablet (4 mg total) by mouth every 8 (eight) hours as needed for up to 15 doses for nausea or vomiting. (Patient not taking: Reported on 04/27/2022) 15 tablet 0      Psychiatric Specialty Exam:  Presentation  General Appearance:  Disheveled  Eye Contact: Fair  Speech: Clear and Coherent  Speech Volume: Normal  Handedness: Right   Mood and Affect  Mood: Euthymic  Affect: Depressed; Blunt   Thought Process  Thought Processes: Coherent  Descriptions of Associations:Intact  Orientation:Partial  Thought Content:Logical  History of Schizophrenia/Schizoaffective disorder:No  Duration of Psychotic Symptoms:N/A  Hallucinations:Hallucinations: None  Ideas of Reference:None  Suicidal  Thoughts:Suicidal Thoughts: No  Homicidal Thoughts:Homicidal Thoughts: No   Sensorium  Memory: Immediate Fair; Recent Fair; Remote Fair  Judgment: Fair  Insight: Fair   Community education officer  Concentration: Fair  Attention Span: Fair  Recall: Poor  Fund of Knowledge: Poor  Language: Fair   Psychomotor Activity  Psychomotor Activity: Psychomotor Activity: Normal   Assets  Assets: Social Support; Intimacy; Housing   Sleep  Sleep: Sleep: Fair    Physical Exam: Physical Exam Vitals and nursing note reviewed.  Psychiatric:        Mood and Affect: Mood normal.        Behavior: Behavior normal.    Review of Systems  Psychiatric/Behavioral:  Negative for depression and suicidal ideas. The patient is not nervous/anxious.   All other systems reviewed and are negative.  Blood pressure 99/72, pulse 93, temperature 97.7 F (36.5 C), temperature source Oral, resp. rate 16, SpO2 96 %. There is no height or weight on file to calculate BMI.  Treatment Plan Summary: Patient to be cleared by psychiatry   - consider consulting with Neurology - keep follow-up with SNF    Disposition: Patient  does not meet criteria for psychiatric inpatient admission. Supportive therapy provided about ongoing stressors. Refer to IOP. Discussed crisis plan, support from social network, calling 911, coming to the Emergency Department, and calling Suicide Hotline.  This service was provided via telemedicine using a 2-way, interactive audio and video technology.  Names of all persons participating in this telemedicine service and their role in this encounter. Name: Garrett [name] Role: patient   Name: T.Aimi Essner  Role: nurse practitioner   Name: RN at bed side Role:   Name:  Role:     Derrill Center, NP 05/03/2022 9:26 AM

## 2022-05-03 NOTE — ED Notes (Signed)
Attempted to give pt meds this am and he refused shaking his head no.

## 2022-05-03 NOTE — ED Notes (Signed)
Chips and sucker given, pt able to eat both

## 2022-05-03 NOTE — ED Notes (Signed)
RN at bedside attempting IV access, phlebotomy was able to obtain blood work

## 2022-05-04 LAB — CBG MONITORING, ED: Glucose-Capillary: 143 mg/dL — ABNORMAL HIGH (ref 70–99)

## 2022-05-04 NOTE — ED Notes (Signed)
CSW made report to [facility] APS at this time. Pt will need interim guardianship for signing into facility and payee. CSW awaiting return call at this time. CSW spoke to admissions with [facility] who states that they will review bed availability and get back to CSW.

## 2022-05-04 NOTE — ED Notes (Signed)
Pt transferred from chair to bed per request

## 2022-05-04 NOTE — ED Notes (Signed)
Pt ambulated to bathroom where he had BM.

## 2022-05-05 LAB — CBG MONITORING, ED
Glucose-Capillary: 105 mg/dL — ABNORMAL HIGH (ref 70–99)
Glucose-Capillary: 111 mg/dL — ABNORMAL HIGH (ref 70–99)
Glucose-Capillary: 95 mg/dL (ref 70–99)

## 2022-05-05 LAB — COMPREHENSIVE METABOLIC PANEL
ALT: 49 U/L — ABNORMAL HIGH (ref 0–44)
AST: 80 U/L — ABNORMAL HIGH (ref 15–41)
Albumin: 2.6 g/dL — ABNORMAL LOW (ref 3.5–5.0)
Alkaline Phosphatase: 45 U/L (ref 38–126)
Anion gap: 10 (ref 5–15)
BUN: 21 mg/dL — ABNORMAL HIGH (ref 6–20)
CO2: 23 mmol/L (ref 22–32)
Calcium: 8.2 mg/dL — ABNORMAL LOW (ref 8.9–10.3)
Chloride: 110 mmol/L (ref 98–111)
Creatinine, Ser: 0.64 mg/dL (ref 0.61–1.24)
GFR, Estimated: >60 mL/min (ref >60)
Glucose, Bld: 97 mg/dL (ref 70–99)
Potassium: 3.6 mmol/L (ref 3.5–5.1)
Sodium: 143 mmol/L (ref 135–145)
Total Bilirubin: 1.3 mg/dL — ABNORMAL HIGH (ref 0.3–1.2)
Total Protein: 7.1 g/dL (ref 6.5–8.1)

## 2022-05-05 NOTE — ED Notes (Signed)
CSW spoke to [place] with DSS, they will be filing for interim guardianship for pt. This will not happen until next week. CSW to follow for updates on this process with DSS.

## 2022-05-05 NOTE — ED Notes (Signed)
Pt ate a slice of cake and dinner roll, as well as a whole cup of ice water. NAD at this time. Resting in bed.

## 2022-05-05 NOTE — ED Notes (Signed)
Social worker at bedside to assess pt. Translator ipad set up and interpreter introduced to pt.

## 2022-05-05 NOTE — ED Provider Notes (Addendum)
Emergency Medicine Observation Re-evaluation Note  Garrett [name] is a 37 y.o. male, seen on rounds today.  Pt initially presented to the ED for complaints of Failure To Thrive Currently, the patient is resting comfortably. Has been able to ambulate about the department. Saw PT on 05/03/22  Physical Exam  BP 93/69 (BP Location: Right Arm)   Pulse 92   Temp 97.9 F (36.6 C) (Oral)   Resp 16   SpO2 100%  Physical Exam  General: Resting comfortably in stretcher Lungs: Normal work of breathing Psych: Calm  ED Course / MDM  EKG:   I have reviewed the labs performed to date as well as medications administered while in observation.  No recent changes noted.  Plan  Current plan is for placement. Currently ward of the state.   #Deconditioning - PT consulted and will follow - Last seen on 05/03/22 and recommends short term rehab with intermittent supervision and assistance  #UTI  - Growing staph  - On Clindamycin for 10 days  #Psych concerns - Refusing medications and food cf depression/SI - TTS saw on 05/03/22 and has cleared him  #Hypernatremia - Likely 2/2 decreased PO intake - Repeat chemistry today improved.  #Elevated LFTs/HBV - Appears to be subacute/chronic in nature - Fu with PCP regarding this    Fransico Meadow, MD 05/05/22 1191    Fransico Meadow, MD 05/05/22 [phone]

## 2022-05-05 NOTE — ED Notes (Signed)
Pt drank a few sips of water and took a few bite of applesauce. Pt given ice cream, per request.

## 2022-05-05 NOTE — ED Notes (Signed)
Attempted to feed pt and give supplement drink. Pt took a few bites and few sips of drink. Refused to eat/drink after that. Translation services used, pt denies pain or any needs at this time. Pt repositioned in bed and instructed to call if needed. Pt verbalized understanding.

## 2022-05-06 LAB — CBG MONITORING, ED
Glucose-Capillary: 93 mg/dL (ref 70–99)
Glucose-Capillary: 105 mg/dL — ABNORMAL HIGH (ref 70–99)

## 2022-05-06 NOTE — ED Notes (Signed)
Pt resting in bed, responds to voice. Alert to surroundings, refused lunch tray again

## 2022-05-06 NOTE — ED Notes (Signed)
Pt completed po intake of Ensure without difficulty. Pt took item with his hand, drank it, and placed empty bottle on edge of bed.

## 2022-05-06 NOTE — ED Provider Notes (Signed)
Emergency Medicine Observation Re-evaluation Note  Garrett [name] is a 37 y.o. male, seen on rounds today.  Pt initially presented to the ED for complaints of Failure To Thrive Currently, the patient is quietly in bed.  Physical Exam  BP 107/84 (BP Location: Right Arm)   Pulse 83   Temp 97.6 F (36.4 C) (Oral)   Resp 14   SpO2 100%  Physical Exam General: No acute distress Cardiac: Well-perfused Lungs: Nonlabored Psych: Cooperative  ED Course / MDM  EKG:   I have reviewed the labs performed to date as well as medications administered while in observation.  Recent changes in the last 24 hours include social worker working on placement.  Plan  Current plan is for plan is for guardianship and placement.    Hayden Rasmussen, MD 05/06/22 [phone]

## 2022-05-06 NOTE — ED Notes (Signed)
Pt took PO meds without difficulty, drank cup of water.

## 2022-05-07 LAB — CBG MONITORING, ED
Glucose-Capillary: 109 mg/dL — ABNORMAL HIGH (ref 70–99)
Glucose-Capillary: 129 mg/dL — ABNORMAL HIGH (ref 70–99)

## 2022-05-07 NOTE — ED Provider Notes (Signed)
Emergency Medicine Observation Re-evaluation Note  Garrett [name] is a 37 y.o. male, seen on rounds today.  Pt initially presented to the ED for complaints of Failure To Thrive Currently, the patient is sleeping.  Physical Exam  BP 107/84 (BP Location: Right Arm)   Pulse 83   Temp 97.6 F (36.4 C) (Oral)   Resp 14   SpO2 100%  Physical Exam General: Sleeping Cardiac: Extremities well-perfused Lungs: Breathing is unlabored Psych: Deferred  ED Course / MDM  EKG:   I have reviewed the labs performed to date as well as medications administered while in observation.  Recent changes in the last 24 hours include none.  Plan  Current plan is for placement.    Godfrey Pick, MD 05/07/22 [phone]

## 2022-05-08 NOTE — ED Provider Notes (Signed)
Emergency Medicine Observation Re-evaluation Note  Garrett [name] is a 37 y.o. male, seen on rounds today.  Pt initially presented to the ED for complaints of Failure To Thrive Currently, the patient is placement.  Physical Exam  BP 99/74   Pulse 76   Temp 97.6 F (36.4 C) (Oral)   Resp 15   SpO2 100%  Physical Exam General: alert and in no distress   ED Course / MDM  EKG:   I have reviewed the labs performed to date as well as medications administered while in observation.  Recent changes in the last 24 hours include none.  Plan  Current plan is for placement in nursing home.    Milton Ferguson, MD 05/08/22 [phone]

## 2022-05-08 NOTE — Progress Notes (Signed)
CM spoke with [facility] admissions rep, who requested updated clinicals on the patient, this was provided via hub.  Rep also reports will need to complete an asset check and staff the case with building administrator for final approval of bed offer as patient will need to admit as LTC with medicaid pending.  Rep reports will update CM once a decision has been reached.

## 2022-05-09 LAB — CBG MONITORING, ED: Glucose-Capillary: 221 mg/dL — ABNORMAL HIGH (ref 70–99)

## 2022-05-09 MED ORDER — GABAPENTIN 100 MG PO CAPS
100.0000 mg | ORAL_CAPSULE | Freq: Two times a day (BID) | ORAL | Status: DC
  Administered 2022-05-09 – 2022-05-17 (×17): 100 mg via ORAL
  Filled 2022-05-09 (×17): qty 1

## 2022-05-09 MED ORDER — BENZTROPINE MESYLATE 1 MG PO TABS
0.5000 mg | ORAL_TABLET | Freq: Every day | ORAL | Status: DC
  Administered 2022-05-09 – 2022-05-17 (×9): 0.5 mg via ORAL
  Filled 2022-05-09 (×8): qty 1

## 2022-05-09 MED ORDER — MIDODRINE HCL 5 MG PO TABS
10.0000 mg | ORAL_TABLET | Freq: Three times a day (TID) | ORAL | Status: DC
  Administered 2022-05-09 – 2022-05-17 (×21): 10 mg via ORAL
  Filled 2022-05-09 (×20): qty 2

## 2022-05-09 MED ORDER — TAMSULOSIN HCL 0.4 MG PO CAPS
0.4000 mg | ORAL_CAPSULE | Freq: Every day | ORAL | Status: DC
  Administered 2022-05-09 – 2022-05-17 (×9): 0.4 mg via ORAL
  Filled 2022-05-09 (×9): qty 1

## 2022-05-09 MED ORDER — GLIPIZIDE 5 MG PO TABS
5.0000 mg | ORAL_TABLET | Freq: Every day | ORAL | Status: DC
  Administered 2022-05-10: 5 mg via ORAL
  Filled 2022-05-09: qty 1

## 2022-05-09 MED ORDER — CITALOPRAM HYDROBROMIDE 20 MG PO TABS
20.0000 mg | ORAL_TABLET | Freq: Every day | ORAL | Status: DC
  Administered 2022-05-09 – 2022-05-17 (×9): 20 mg via ORAL
  Filled 2022-05-09 (×11): qty 1

## 2022-05-09 NOTE — Progress Notes (Signed)
Physical Therapy Treatment Patient Details Name: Garrett [name] MRN: [id number] DOB: 1985/12/22 Today's Date: 05/09/2022   History of Present Illness Garrett [name] is a 37 y.o. male.     HPI      37 y/o male - very unfortunate situation - he is in a NH - due to ruptured appendicitis / s/p ex lap - I believe he is a ward of the state, he has been seen in this emergency department multiple times for failure to thrive, I have seen him personally several times, he was admitted in December 2023 with acute cystitis and transaminitis with severe malnutrition, he continues to have ongoing failure to thrive and refuses to eat at his nursing facility.  He was sent in because of what appeared to be cloudy appearing urine in his Foley catheter.  There is no abnormal vital signs.  The patient cannot give any information, we tried to use the interpreter for his language and he cannot speak to them    PT Comments    Patient presents alert and cooperative for therapy.  Patient demonstrates fair/good return for sitting up at bedside, unsteady on feet with occasional buckling of knees, scissoring of legs when making turns during gait training and limited mostly due to c/o fatigue.  Patient tolerated sitting up in chair after therapy - nursing staff aware.  Patient will benefit from continued skilled physical therapy in hospital and recommended venue below to increase strength, balance, endurance for safe ADLs and gait.    Recommendations for follow up therapy are one component of a multi-disciplinary discharge planning process, led by the attending physician.  Recommendations may be updated based on patient status, additional functional criteria and insurance authorization.  Follow Up Recommendations  Skilled nursing-short term rehab (<3 hours/day) Can patient physically be transported by private vehicle: Yes   Assistance Recommended at Discharge Intermittent Supervision/Assistance  Patient can return home with the following  A lot of help with walking and/or transfers;A lot of help with bathing/dressing/bathroom;Assistance with cooking/housework;Help with stairs or ramp for entrance   Equipment Recommendations  None recommended by PT    Recommendations for Other Services       Precautions / Restrictions Precautions Precautions: Fall Restrictions Weight Bearing Restrictions: No     Mobility  Bed Mobility Overal bed mobility: Needs Assistance Bed Mobility: Supine to Sit     Supine to sit: Min assist     General bed mobility comments: increased time, labored movement    Transfers Overall transfer level: Needs assistance Equipment used: Rolling walker (2 wheels) Transfers: Sit to/from Stand, Bed to chair/wheelchair/BSC Sit to Stand: Min assist   Step pivot transfers: Min assist, Mod assist       General transfer comment: unsteady labored movement wth scissoring of legs when turning    Ambulation/Gait Ambulation/Gait assistance: Min assist, Mod assist Gait Distance (Feet): 40 Feet Assistive device: Rolling walker (2 wheels) Gait Pattern/deviations: Decreased step length - right, Decreased step length - left, Decreased stride length, Knees buckling, Scissoring, Staggering left, Staggering right Gait velocity: decreased     General Gait Details: slightly increased endurance/distance for gait training with occasional buckling of knees drifting left/right without loss of balance   Stairs             Wheelchair Mobility    Modified Rankin (Stroke Patients Only)       Balance Overall balance assessment: Needs assistance Sitting-balance support: Feet supported, No upper extremity supported Sitting balance-Leahy Scale: Fair Sitting balance - Comments: fair/good  seated at EOB   Standing balance support: During functional activity, Bilateral upper extremity supported Standing balance-Leahy Scale: Poor Standing balance comment: fair/poor using RW                             Cognition Arousal/Alertness: Awake/alert Behavior During Therapy: WFL for tasks assessed/performed Overall Cognitive Status: Within Functional Limits for tasks assessed                                          Exercises      General Comments        Pertinent Vitals/Pain Pain Assessment Pain Assessment: No/denies pain    Home Living                          Prior Function            PT Goals (current goals can now be found in the care plan section) Acute Rehab PT Goals Patient Stated Goal: return home PT Goal Formulation: With patient Time For Goal Achievement: 05/17/22 Potential to Achieve Goals: Good Progress towards PT goals: Progressing toward goals    Frequency    Min 2X/week      PT Plan Current plan remains appropriate    Co-evaluation              AM-PAC PT "6 Clicks" Mobility   Outcome Measure  Help needed turning from your back to your side while in a flat bed without using bedrails?: A Little Help needed moving from lying on your back to sitting on the side of a flat bed without using bedrails?: A Little Help needed moving to and from a bed to a chair (including a wheelchair)?: A Little Help needed standing up from a chair using your arms (e.g., wheelchair or bedside chair)?: A Little Help needed to walk in hospital room?: A Lot Help needed climbing 3-5 steps with a railing? : A Lot 6 Click Score: 16    End of Session   Activity Tolerance: Patient tolerated treatment well;Patient limited by fatigue Patient left: in chair;with call bell/phone within reach Nurse Communication: Mobility status PT Visit Diagnosis: Unsteadiness on feet (R26.81);Other abnormalities of gait and mobility (R26.89);Muscle weakness (generalized) (M62.81)     Time: 7425-9563 PT Time Calculation (min) (ACUTE ONLY): 27 min  Charges:  $Gait Training: 8-22 mins $Therapeutic Activity: 8-22 mins                     11:26 AM,  05/09/22 Lonell Grandchild, MPT Physical Therapist with [facility] [phone] office [phone] mobile phone

## 2022-05-09 NOTE — ED Provider Notes (Signed)
Emergency Medicine Observation Re-evaluation Note  Morell Costilla is a 37 y.o. male, seen on rounds today.  Pt initially presented to the ED for complaints of Failure To Thrive Currently, the patient is resting comfortably.  Patient has been held for failure to thrive in the emergency room, seeking placement since 1-25. No new changes or concerns from nursing staff.  There is a note from social work team, they are continuing their efforts for placement.  Physical Exam  BP 99/75   Pulse 93   Temp 98.6 F (37 C)   Resp 16   SpO2 100%  Physical Exam General: No acute distress Cardiac: Regular rate Lungs: No respiratory distress Psych: Calm  ED Course / MDM  EKG:   I have reviewed the labs performed to date as well as medications administered while in observation.  Recent changes in the last 24 hours include : None.  Plan  Current plan is for continue supportive care in the emergency room as patient awaits placement.    Varney Biles, MD 05/09/22 [phone]

## 2022-05-09 NOTE — Progress Notes (Signed)
TOC outreached to [employer] with [facility], VM left.  Additionally spoke with regional liaison for [facility] who reports will review clinicals and let CM know if any of their facilities are able to extend an offer.

## 2022-05-09 NOTE — Progress Notes (Signed)
TOC received notification from [facility] admissions who report they are rescinding bed offer as patient is below their facilities minimum age.

## 2022-05-09 NOTE — Progress Notes (Signed)
CM spoke with Corporate liaison for [facility] who reports patient has bed offers from three of their facilities: [facility], [facility], and [facility].   CM spoke with DSS CSW who reports they have filed for interim guardianship and will have a hearing on 05/10/21.  Once interim guardian is in place, TOC will be able to present bed offers and finalize placement arrangements.

## 2022-05-09 NOTE — ED Notes (Signed)
Pt did well with crushed meds in applesauce

## 2022-05-09 NOTE — ED Notes (Signed)
PT at bedside.

## 2022-05-09 NOTE — ED Notes (Addendum)
Pt abx found in pt bed during linen change. Unknown as to when this medication was missed.  RN notified provider.

## 2022-05-10 NOTE — ED Provider Notes (Signed)
Patient will go to [facility] today   Milton Ferguson, MD 05/10/22 [phone]

## 2022-05-10 NOTE — ED Notes (Addendum)
CSW spoke with [facility] leadership Nancy Marus who states needed paperwork has been sent to [facility]. CSW spoke to [place] in admissions who states they are ready to accept pt. CSW confirmed room and report numbers. CSW updated RN and MD of plans for D/C. CSW to update [place].   CSW updated by Jackelyn Poling with [facility] who states that pts PASSR has expired. CSW has uploaded requested documents to PASSR. CSW updated RN that pt cannot leave until this has been completed. CSW awaiting response from PASSR. CSW will update ED staff when there is a response.

## 2022-05-11 LAB — CBG MONITORING, ED
Glucose-Capillary: 58 mg/dL — ABNORMAL LOW (ref 70–99)
Glucose-Capillary: 59 mg/dL — ABNORMAL LOW (ref 70–99)
Glucose-Capillary: 100 mg/dL — ABNORMAL HIGH (ref 70–99)
Glucose-Capillary: 245 mg/dL — ABNORMAL HIGH (ref 70–99)
Glucose-Capillary: 109 mg/dL — ABNORMAL HIGH (ref 70–99)

## 2022-05-11 MED ORDER — BACITRACIN ZINC 500 UNIT/GM EX OINT
TOPICAL_OINTMENT | Freq: Two times a day (BID) | CUTANEOUS | Status: DC
  Administered 2022-05-17: 1 via TOPICAL
  Filled 2022-05-11 (×12): qty 0.9

## 2022-05-11 MED ORDER — INSULIN ASPART 100 UNIT/ML IJ SOLN
0.0000 [IU] | Freq: Three times a day (TID) | INTRAMUSCULAR | Status: DC
  Administered 2022-05-11: 2 [IU] via SUBCUTANEOUS
  Administered 2022-05-12 – 2022-05-17 (×6): 1 [IU] via SUBCUTANEOUS
  Filled 2022-05-11 (×7): qty 1

## 2022-05-11 NOTE — ED Notes (Signed)
Hypoglycemic Event  CBG: 58      Treatment: 4 oz juice/soda  Symptoms: None  Follow-up CBG: Time:1008 CBG Result:59  Possible Reasons for Event: Inadequate meal intake  Comments/MD notified:Dr. Regenia Skeeter notified. Additional 4oz of juice given to pt after follow-up CBG.    Garrett [name] Renae

## 2022-05-11 NOTE — ED Notes (Signed)
Pt took 2 bites of applesauce with meds and then refused to eat or drink any more.  Wound on penis dressed and bacitracin applied

## 2022-05-11 NOTE — ED Notes (Signed)
Patient took medication whole (one at a time with ensure.)

## 2022-05-11 NOTE — ED Notes (Signed)
CSW spoke to [facility] reviewer who states he will arrive to hospital tomorrow to complete assessment with pt. CSW sent secure email with pts legal guardian paperwork and LG number. TOC to follow.

## 2022-05-11 NOTE — ED Provider Notes (Signed)
Emergency Medicine Observation Re-evaluation Note  Reginald Lukasik is a 37 y.o. male, seen on rounds today.  Pt initially presented to the ED for complaints of Failure To Thrive Currently, the patient is resting.  Physical Exam  BP 103/83   Pulse 79   Temp 97.6 F (36.4 C) (Oral)   Resp 16   SpO2 100%  Physical Exam General: no acute distress Lungs: normal effort GU: there is an erosion to base of penis. There is some mild discharge, no overt cellulitis (exam performed with nurse)  ED Course / MDM  EKG:   I have reviewed the labs performed to date as well as medications administered while in observation.  No recent changes in the last 24 hours.  Plan  Current plan is for Discharge to [facility].  For the base of penis, will apply bacitracin ointment. Discussed with Dr. Jeffie Pollock of urology. He will need an urgent suprapubic catheter, ideally in next couple weeks. If foley is working/doesn't need change right now, can get this an outpatient. Foley is currently working. Will order urgent IR outpatient suprapubic catheter placement. Otherwise needs moist dressing over the area.    Sherwood Gambler, MD 05/11/22 [phone]

## 2022-05-11 NOTE — Inpatient Diabetes Management (Signed)
Inpatient Diabetes Program Recommendations  AACE/ADA: New Consensus Statement on Inpatient Glycemic Control  Target Ranges:  Prepandial:   less than 140 mg/dL      Peak postprandial:   less than 180 mg/dL (1-2 hours)      Critically ill patients:  140 - 180 mg/dL    Latest Reference Range & Units 05/11/22 09:50 05/11/22 10:08 05/11/22 10:41  Glucose-Capillary 70 - 99 mg/dL 58 (L) 59 (L) 100 (H)   Review of Glycemic Control  Diabetes history: DM2 Outpatient Diabetes medications: Lantus 10 units daily, Glipizide 5 mg QAM, Novolog 0-6 units TID (not taking) Current orders for Inpatient glycemic control: Glipizide 5 mg QAM  Inpatient Diabetes Program Recommendations:    Insulin: Please consider ordering Novolog 0-6 units TID with meals.  Oral DM medication: CBG 58 mg/dl today at 9:50. Please discontinue Glipizide. Would not recommend patient be discharged on Glipizide due to increased risk of hypoglycemia especially since patient has failure to thrive.  Thanks, Barnie Alderman, RN, MSN, [place] Diabetes Coordinator Inpatient Diabetes Program [phone] (Team Pager from 8am to [place])

## 2022-05-11 NOTE — ED Notes (Signed)
EDP [place] made aware. Bryson Corona Edd Fabian

## 2022-05-11 NOTE — ED Notes (Signed)
Hypoglycemic Event  CBG: 59  Treatment: 4 oz juice/soda  Symptoms: None  Follow-up CBG: Time:1041 CBG Result:100  Possible Reasons for Event: Inadequate meal intake  Comments/MD notified:No further orders given.    Garrett [name]

## 2022-05-11 NOTE — ED Notes (Signed)
Patient is dry at this time.

## 2022-05-12 LAB — CBG MONITORING, ED
Glucose-Capillary: 142 mg/dL — ABNORMAL HIGH (ref 70–99)
Glucose-Capillary: 174 mg/dL — ABNORMAL HIGH (ref 70–99)
Glucose-Capillary: 158 mg/dL — ABNORMAL HIGH (ref 70–99)

## 2022-05-12 NOTE — ED Notes (Signed)
Pt resting with eyes open. nad

## 2022-05-12 NOTE — ED Provider Notes (Signed)
Patient will be ready for discharge to nursing facility states that they will be available after 4 PM this afternoon.  Patient stable no acute changes.   Fredia Sorrow, MD 05/12/22 [phone]

## 2022-05-12 NOTE — ED Notes (Signed)
PASRR from state just left visiting pt. No changes in pt status.

## 2022-05-12 NOTE — ED Notes (Signed)
Interpreter line used. Pt denies pain. States only thing he wants is [employer]. Juice given. States he is not hungry. Pt is a/o x 3 at this time. States he is at the hospital for his belly pain but denies any at this time. Wound to left posterior penis has no ss of infection and not draining at this time. Can see foley cath through the wound. Rest of skin integrity intact. No sacral redness noted.pt able to move side to side in bed with no help. Pt pleasant. Pt has diaper on and is dry at this time. Iv to left wrist intact and saline locked. Awaiting placement

## 2022-05-13 LAB — CBG MONITORING, ED
Glucose-Capillary: 123 mg/dL — ABNORMAL HIGH (ref 70–99)
Glucose-Capillary: 102 mg/dL — ABNORMAL HIGH (ref 70–99)

## 2022-05-13 NOTE — ED Notes (Signed)
CSW confirmed that PASSR interviewer came to visit pt at hospital. CSW awaiting updates in [facility] system. Pt cannot D/C from hospital until PASSR has completed full assessment. TOC to follow.

## 2022-05-14 LAB — CBG MONITORING, ED
Glucose-Capillary: 123 mg/dL — ABNORMAL HIGH (ref 70–99)
Glucose-Capillary: 152 mg/dL — ABNORMAL HIGH (ref 70–99)

## 2022-05-14 NOTE — ED Notes (Signed)
Pt given lunch tray.

## 2022-05-14 NOTE — ED Provider Notes (Signed)
  Physical Exam  BP 93/60 (BP Location: Left Arm)   Pulse 78   Temp 97.7 F (36.5 C)   Resp 15   SpO2 99%   Physical Exam  Procedures  Procedures  ED Course / MDM   Clinical Course as of 05/14/22 0659  Sat Apr 29, 2022  0749 Culture, Urine (Do not remove urinary catheter, catheter placed by urology or difficult to place)(!) [BM]    Clinical Course User Index [BM] Noemi Chapel, MD   Medical Decision Making Amount and/or Complexity of Data Reviewed Labs: ordered. Decision-making details documented in ED Course.  Risk Prescription drug management.   Reportedly pending placement to nursing home.  Reviewed transition of care note.  "CSW confirmed that PASSR interviewer came to visit pt at hospital. CSW awaiting updates in PASSR system. Pt cannot D/C from hospital until PASSR has completed full assessment. TOC to follow.  "       Davonna Belling, MD 05/14/22 0700

## 2022-05-14 NOTE — ED Notes (Signed)
Patient ate an icy pop, but has not touched his dinner

## 2022-05-15 LAB — CBG MONITORING, ED
Glucose-Capillary: 124 mg/dL — ABNORMAL HIGH (ref 70–99)
Glucose-Capillary: 151 mg/dL — ABNORMAL HIGH (ref 70–99)
Glucose-Capillary: 133 mg/dL — ABNORMAL HIGH (ref 70–99)

## 2022-05-15 NOTE — ED Notes (Signed)
Just finished feeding patient lunch. Ate about 50%.

## 2022-05-15 NOTE — ED Provider Notes (Signed)
Patient fell getting off the commode to have a bowel movement.  He denies injury.  Exam shows no evidence of significant injury.   Delora Fuel, MD [id number] [phone]

## 2022-05-15 NOTE — ED Provider Notes (Signed)
Emergency Medicine Observation Re-evaluation Note  Garrett [name] is a 37 y.o. male, seen on rounds today.  Pt initially presented to the ED for complaints of Failure To Thrive Currently, the patient denies any acute complaints.  Physical Exam  BP 101/64 (BP Location: Left Arm)   Pulse 67   Temp 98 F (36.7 C) (Oral)   Resp 16   SpO2 99%  Physical Exam General: Resting comfortably in stretcher Lungs: Normal work of breathing Psych: Calm  ED Course / MDM  EKG:   I have reviewed the labs performed to date as well as medications administered while in observation.  Recent changes in the last 24 hours include had fall last night without any injuries.   Plan  Current plan is for placement.    Fransico Meadow, MD 05/15/22 [phone]

## 2022-05-16 LAB — CBG MONITORING, ED
Glucose-Capillary: 88 mg/dL (ref 70–99)
Glucose-Capillary: 95 mg/dL (ref 70–99)
Glucose-Capillary: 179 mg/dL — ABNORMAL HIGH (ref 70–99)
Glucose-Capillary: 162 mg/dL — ABNORMAL HIGH (ref 70–99)
Glucose-Capillary: 156 mg/dL — ABNORMAL HIGH (ref 70–99)

## 2022-05-16 NOTE — ED Notes (Signed)
CSW checked [place] [employer] website for updates on pts PASSR. At this time there are no updates. CSW spoke to [facility] reviewer yesterday afternoon who states all documents were submitted on his end for further review. TOC to follow.

## 2022-05-16 NOTE — ED Notes (Signed)
Pt asked this writer, "I need bathroom". Writer asked if pt could stand, it was informed to this Probation officer that pt could go to the bathroom via wheelchair. Writer got everything ready for patient, when writer stood patient up, patient did well until he sat down on the wheelchair. Pt began sliding down on wheelchair and would have his body limp. Writer called out to [employer] near by and Agricultural consultant. Pt back on stretcher, pt stated that they felt dizzy. Pt currently lying on bed, CBG taken by Charge RN.

## 2022-05-16 NOTE — Progress Notes (Signed)
PASRR received, [id number] F.  Spoke with debbie with [facility], reports no male beds available today, possibility of opening tomorrow.

## 2022-05-16 NOTE — ED Notes (Signed)
Pt's lunch had arrived, writer sat pt up and fed patient. Pt did well when eating (about 95% and 270m of [employer]), pt said "no" when done and asked "lay down" when wanting to lay down.

## 2022-05-16 NOTE — ED Provider Notes (Signed)
Emergency Medicine Observation Re-evaluation Note  Garrett [name] is a 37 y.o. male, seen on rounds today.  Pt initially presented to the ED for complaints of Failure To Thrive Currently, the patient is resting comfortably.  Physical Exam  BP 94/62   Pulse 78   Temp 98 F (36.7 C)   Resp 14   SpO2 100%  Physical Exam General: Resting comfortably in stretcher Lungs: Normal work of breathing Psych: Calm  ED Course / MDM  EKG:   I have reviewed the labs performed to date as well as medications administered while in observation.  Recent changes in the last 24 hours include no acute events.  Plan  Current plan is for placement.    Fransico Meadow, MD 05/16/22 2227

## 2022-05-17 LAB — CBG MONITORING, ED
Glucose-Capillary: 124 mg/dL — ABNORMAL HIGH (ref 70–99)
Glucose-Capillary: 174 mg/dL — ABNORMAL HIGH (ref 70–99)
Glucose-Capillary: 146 mg/dL — ABNORMAL HIGH (ref 70–99)
Glucose-Capillary: 138 mg/dL — ABNORMAL HIGH (ref 70–99)

## 2022-05-17 NOTE — ED Notes (Signed)
Bacitracin and dressing applied to penis wound

## 2022-05-17 NOTE — ED Notes (Signed)
CSW spoke to [place] in admissions with [facility] who states that the facility is working to find a male bed for pt. Garrett [name] to update CSW later this afternoon when she returns to the building. Jackelyn Poling states it will likely be tomorrow and not today that they will be able to have a bed for pt.

## 2022-05-17 NOTE — ED Notes (Signed)
Pt ate 90% of dinner tray & 100% of ice cream cup w assistance the of this tech.

## 2022-05-17 NOTE — ED Provider Notes (Signed)
  Physical Exam  BP 97/69 (BP Location: Left Arm)   Pulse 78   Temp 98.3 F (36.8 C) (Oral)   Resp 16   SpO2 100%   Physical Exam  Procedures  Procedures  ED Course / MDM   Clinical Course as of 05/17/22 0703  Sat Apr 29, 2022  0749 Culture, Urine (Do not remove urinary catheter, catheter placed by urology or difficult to place)(!) [BM]    Clinical Course User Index [BM] Noemi Chapel, MD   Medical Decision Making Amount and/or Complexity of Data Reviewed Labs: ordered. Decision-making details documented in ED Course.  Risk Prescription drug management.   Pending placement.  Potential placement at [facility] today if bed becomes available.       Davonna Belling, MD 05/17/22 [phone]

## 2022-05-18 LAB — CBG MONITORING, ED: Glucose-Capillary: 264 mg/dL — ABNORMAL HIGH (ref 70–99)

## 2022-05-18 NOTE — ED Provider Notes (Signed)
Emergency Medicine Observation Re-evaluation Note  Garrett [name] is a 37 y.o. male, seen on rounds today.  Pt initially presented to the ED for complaints of Failure To Thrive Currently, the patient is resting comfortably.  Physical Exam  BP 97/69 (BP Location: Left Arm)   Pulse 78   Temp 97.9 F (36.6 C) (Axillary)   Resp 16   SpO2 100%  Physical Exam Constitutional:      General: He is not in acute distress.    Appearance: Normal appearance.  HENT:     Head: Normocephalic and atraumatic.     Nose: No congestion or rhinorrhea.  Eyes:     General:        Right eye: No discharge.        Left eye: No discharge.     Extraocular Movements: Extraocular movements intact.     Pupils: Pupils are equal, round, and reactive to light.  Cardiovascular:     Rate and Rhythm: Normal rate and regular rhythm.     Heart sounds: No murmur heard. Pulmonary:     Effort: No respiratory distress.     Breath sounds: No wheezing or rales.  Abdominal:     General: There is no distension.     Tenderness: There is no abdominal tenderness.  Musculoskeletal:        General: Normal range of motion.     Cervical back: Normal range of motion.  Skin:    General: Skin is warm and dry.  Neurological:     General: No focal deficit present.     Mental Status: He is alert.      ED Course / MDM  EKG:   I have reviewed the labs performed to date as well as medications administered while in observation.  Recent changes in the last 24 hours include social work able to have patient successfully placed at [facility] today.  Plan  Current plan is for discharge to [facility].    Teressa Lower, MD 05/18/22 [phone]

## 2022-05-18 NOTE — ED Notes (Signed)
Report given to Linton Rump at [employer]

## 2022-05-18 NOTE — ED Notes (Addendum)
CSW reached out to admissions at [facility] for update on bed availability x2 today. CSW has not received a response at this time. TOC to follow.   3:00pm: CSW updated that [facility] is ready to accept pt. CSW updated MD and RN of this. RN was provided with numbers for room and report. CSW updated RN that when RN is ready ED can call for transportation through [place] signing off.

## 2022-06-01 ENCOUNTER — Ambulatory Visit (INDEPENDENT_AMBULATORY_CARE_PROVIDER_SITE_OTHER): Payer: Medicaid Other | Admitting: Urology

## 2022-06-01 ENCOUNTER — Encounter: Payer: Self-pay | Admitting: Urology

## 2022-06-01 VITALS — BP 63/47 | HR 89

## 2022-06-01 DIAGNOSIS — N360 Urethral fistula: Secondary | ICD-10-CM | POA: Diagnosis not present

## 2022-06-01 DIAGNOSIS — R339 Retention of urine, unspecified: Secondary | ICD-10-CM

## 2022-06-01 NOTE — Progress Notes (Addendum)
Cath Change/ Replacement  Patient is present today for a catheter change due to urinary retention.  32m of water was removed from the balloon, a 16 coude FR foley cath was removed without difficulty.  Patient was cleaned and prepped in a sterile fashion with betadine and 2% lidocaine jelly was instilled into the urethra. A 16 FR foley cath was replaced into the bladder, no complications were noted.  Noted open hole under penis, Dr. WJeffie Pollockassessed patient prior to catheter change and the area was also cleaned during catheter change.  Urine return was noted 166mand urine was yellow in color. The balloon was filled with 1055mf sterile water. A bed bag was attached for drainage. Patient was given proper instruction on catheter care.    Performed by: KouLevi AlandMA  Follow up: Follow up per MD instructions

## 2022-06-01 NOTE — Progress Notes (Signed)
Subjective: 1. Urinary retention   2. 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  He was in the ER throughout his stay until 05/18/22.   I spoke to Dr. 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   He reports some abdominal pain but no penile pain.   Garrett [name] is a 37 yo male with a history of diabetes without neuropathy symptoms.  He was at [facility] for a ruptured appendix in October.   He was back in the ER on 10/30 for abdominal pain and diarrhea and was found on CT to have urinary retention but his CT on  01/25/22 also showed retention.  He still has a foley in place.   He also had penile edema and severe phimosis as well.  He had some difficulty voiding for some time prior to his ER visit.  He had no prior evaluation or treatment for the voiding.     ROS:  Review of Systems  Unable to perform ROS: Language    No Known Allergies  Past Medical History:  Diagnosis Date   Acute metabolic encephalopathy 99991111   Altered mental status    Anemia 05/05/2021   Chronic Hepatitis B 05/12/2021   Chronic hepatitis B without hepatic coma ([place]) 01/14/2022   Depression    Diabetes mellitus (HCC)    Elevated LFTs 03/09/2020   Hypoalbuminemia 01/24/2022   Hyponatremia 01/24/2022   Hypotension 01/26/2022   Major depressive disorder, recurrent episode, moderate with mood-congruent psychotic features ([place])    Major depressive disorder, recurrent severe without psychotic features ([place]) 06/20/2017   Physical deconditioning    Postoperative intra-abdominal abscess 01/20/2022   Severe malnutrition ([place]) 01/18/2022   Severe malnutrition ([place]) 01/18/2022    Past Surgical History:  Procedure Laterality Date   APPENDECTOMY     EXPLORATORY LAPAROTOMY      Social History   Socioeconomic History    Marital status: Single    Spouse name: Not on file   Number of children: Not on file   Years of education: Not on file   Highest education level: Not on file  Occupational History   Not on file  Tobacco Use   Smoking status: Former    Types: Cigarettes   Smokeless tobacco: Never  Substance and Sexual Activity   Alcohol use: Not Currently    Comment: occassionally   Drug use: Not Currently   Sexual activity: Not on file  Other Topics Concern   Not on file  Social History Narrative   Not on file   Social Determinants of Health   Financial Resource Strain: Not on file  Food Insecurity: Not on file  Transportation Needs: Not on file  Physical Activity: Not on file  Stress: Not on file  Social Connections: Not on file  Intimate Partner Violence: Not on file    History reviewed. No pertinent family history.  Anti-infectives: Anti-infectives (From admission, onward)    None       Current Outpatient Medications  Medication Sig Dispense Refill   Accu-Chek Softclix Lancets lancets Use to test blood sugars up to 4 times daily as directed. 100 each 0   acetaminophen (TYLENOL) 325 MG tablet Take 2 tablets (650 mg total) by mouth every 6 (six) hours as needed for mild pain or  moderate pain. 7 tablet 0   ANTI-DIARRHEAL 2 MG tablet Take 2 mg by mouth as needed for diarrhea or loose stools.     benztropine (COGENTIN) 0.5 MG tablet Take 1 tablet (0.5 mg total) by mouth 2 (two) times daily. (Patient taking differently: Take 0.5 mg by mouth at bedtime.) 7 tablet 0   Blood Glucose Monitoring Suppl (BLOOD GLUCOSE MONITOR SYSTEM) w/Device KIT Use to test blood sugars up to 4 times daily as directed. 1 kit 0   escitalopram (LEXAPRO) 10 MG tablet Take 1 tablet (10 mg total) by mouth daily. 7 tablet 0   feeding supplement, GLUCERNA SHAKE, (GLUCERNA SHAKE) LIQD Take 237 mLs by mouth 3 (three) times daily between meals. 237 mL 0   gabapentin (NEURONTIN) 100 MG capsule Take 1 capsule (100 mg  total) by mouth 2 (two) times daily. 14 capsule 0   glucose blood (ACCU-CHEK GUIDE) test strip Use to check blood sugars up to 4 times daily as directed. 100 each 0   insulin aspart (NOVOLOG) 100 UNIT/ML FlexPen Inject 0-6 Units into the skin 3 (three) times daily with meals. 3 mL 3   insulin glargine (LANTUS) 100 UNIT/ML Solostar Pen Inject 10 Units into the skin daily. 3 mL 3   Insulin Pen Needle 32G X 4 MM MISC Use to inject insulin up to 4 times daily. 100 each 0   LORazepam (ATIVAN) 0.5 MG tablet Take 1 tablet (0.5 mg total) by mouth at bedtime. 7 tablet 0   miconazole nitrate (MICATIN) POWD Apply 1 application. topically 3 (three) times daily. 5 g 0   midodrine (PROAMATINE) 10 MG tablet Take 1 tablet (10 mg total) by mouth 3 (three) times daily with meals. 90 tablet 0   ondansetron (ZOFRAN-ODT) 4 MG disintegrating tablet Take 1 tablet (4 mg total) by mouth every 8 (eight) hours as needed for up to 15 doses for nausea or vomiting. 15 tablet 0   promethazine (PHENERGAN) 25 MG suppository Place 1 suppository (25 mg total) rectally every 8 (eight) hours as needed for refractory nausea / vomiting. 20 each 0   tamsulosin (FLOMAX) 0.4 MG CAPS capsule Take 1 capsule (0.4 mg total) by mouth daily. 30 capsule 11   No current facility-administered medications for this visit.     Objective: Vital signs in last 24 hours: BP (!) 63/47   Pulse 89   Intake/Output from previous day: No intake/output data recorded. Intake/Output this shift: '@IOTHISSHIFT'$ @   Physical Exam Vitals reviewed.  Constitutional:      Appearance: He is ill-appearing.  Genitourinary:    Comments: He has an uncircumcised phallus with a foley indwelling and about 2cm of foley is exposed at the left base of the penis.  There is clean granulation tissue present and the size of the fistula appears stable over the last 3 weeks.     Lab Results:  No results found for this or any previous visit (from the past 24 hour(s)).   BMET No results for input(s): "NA", "K", "CL", "CO2", "GLUCOSE", "BUN", "CREATININE", "CALCIUM" in the last 72 hours. PT/INR No results for input(s): "LABPROT", "INR" in the last 72 hours. ABG No results for input(s): "PHART", "HCO3" in the last 72 hours.  Invalid input(s): "PCO2", "PO2" His UA was clear on 10/31 and his Cr was 0.9 on 01/30/22.  Studies/Results: No results found. I have reviewed his recent hospital notes and labs.     Assessment/Plan: Urethrocutaneous fistula.  He has a 2cm urethrocutaneous fistula at the base  of the penis on the left.  It appears stable and clean and at this time with his very poor state of health I think he will be best served with continued foley exchanges.  We could consider a suprapubic tube placement but I think we are safe to continue with the foley for now.   Urinary retention.   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  Continue foley drainage.  Phimosis.   He has moderate to severe phimosis but no balanitis.  I will monitor this situation.   No orders of the defined types were placed in this encounter.    Orders Placed This Encounter  Procedures   INSERT,TEMP INDWELLING BLAD CATH,SIMPLE     Return in about 4 weeks (around 06/29/2022) for foley exchange monthly with NV and see me in 3 months. .    CC: Virginia Rochester NP     Irine Seal 06/02/2022

## 2022-06-26 ENCOUNTER — Ambulatory Visit (INDEPENDENT_AMBULATORY_CARE_PROVIDER_SITE_OTHER): Payer: Medicaid Other | Admitting: Urology

## 2022-06-26 DIAGNOSIS — R339 Retention of urine, unspecified: Secondary | ICD-10-CM | POA: Diagnosis not present

## 2022-06-26 NOTE — Progress Notes (Signed)
Cath Change/ Replacement  Patient is present today for a catheter change due to urinary retention.  86ml of water was removed from the balloon, a 16FR foley cath was removed without difficulty.  Patient was cleaned and prepped in a sterile fashion with betadine and 2% lidocaine jelly was instilled into the urethra. A 16 FR foley cath was replaced into the bladder, no complications were noted. Urine return was noted 28ml and urine was yellow in color. The balloon was filled with 37ml of sterile water. A bed bag was attached for drainage.  A night bag was also given to the patient and patient was given instruction on how to change from one bag to another. Patient was given proper instruction on catheter care.    Performed by: Levi Aland, CMA  Follow up: Follow up in 1 month

## 2022-07-13 ENCOUNTER — Ambulatory Visit: Payer: Medicaid Other | Admitting: Podiatry

## 2022-07-13 ENCOUNTER — Ambulatory Visit (INDEPENDENT_AMBULATORY_CARE_PROVIDER_SITE_OTHER): Payer: Medicaid Other | Admitting: Podiatry

## 2022-07-13 DIAGNOSIS — M79674 Pain in right toe(s): Secondary | ICD-10-CM

## 2022-07-13 DIAGNOSIS — E1142 Type 2 diabetes mellitus with diabetic polyneuropathy: Secondary | ICD-10-CM

## 2022-07-13 DIAGNOSIS — M79675 Pain in left toe(s): Secondary | ICD-10-CM

## 2022-07-13 DIAGNOSIS — B351 Tinea unguium: Secondary | ICD-10-CM

## 2022-07-13 NOTE — Progress Notes (Signed)
  Subjective:  Patient ID: Garrett [name], male    DOB: [birth year],  MRN: [id number]  Chief Complaint  Patient presents with   diabetic foot care    37 y.o. male presents with the above complaint. History confirmed with patient. Patient presenting with pain related to dystrophic thickened elongated nails. Patient is unable to trim own nails related to nail dystrophy and/or mobility issues. Patient does  have a history of T2DM.   Objective:  Physical Exam: warm, good capillary refill nail exam onychomycosis of the toenails, onycholysis, and dystrophic nails, xerosis of the skin DP pulses palpable, PT pulses palpable, and protective sensation diminished Left Foot:  Pain with palpation of nails due to elongation and dystrophic growth.  Right Foot: Pain with palpation of nails due to elongation and dystrophic growth.   Assessment:   1. Pain due to onychomycosis of toenails of both feet   2. DM type 2 with diabetic peripheral neuropathy      Plan:  Patient was evaluated and treated and all questions answered.  #Onychomycosis with pain  -Nails palliatively debrided as below. -Educated on self-care  Procedure: Nail Debridement Rationale: Pain Type of Debridement: manual, sharp debridement. Instrumentation: Nail nipper, rotary burr. Number of Nails: 10  Return in about 3 months (around 10/12/2022) for [facility].         Corinna Gab, DPM [facility] / [facility]

## 2022-07-27 ENCOUNTER — Ambulatory Visit: Payer: Medicaid Other

## 2022-08-01 ENCOUNTER — Ambulatory Visit: Payer: Medicaid Other

## 2022-08-04 ENCOUNTER — Ambulatory Visit: Payer: Medicaid Other

## 2022-08-10 ENCOUNTER — Ambulatory Visit (INDEPENDENT_AMBULATORY_CARE_PROVIDER_SITE_OTHER): Payer: Medicaid Other

## 2022-08-10 DIAGNOSIS — R339 Retention of urine, unspecified: Secondary | ICD-10-CM

## 2022-08-10 NOTE — Progress Notes (Addendum)
Cath Change/ Replacement  Patient is present today for a catheter change due to urinary retention.  10 ml of water was removed from the balloon, a 16 FR foley cath was removed without difficulty.  Patient was cleaned and prepped in a sterile fashion with betadine and 2% lidocaine jelly was instilled into the urethra. A 16  FR foley cath was replaced into the bladder, no complications were noted. Urine return was noted 5 ml and urine was yellow in color. The balloon was filled with 10ml of sterile water. A night bag was attached for drainage.  A night bag was also given to the patient and patient was given instruction on how to change from one bag to another. Patient was given proper instruction on catheter care.    Performed by: Kennyth Lose, CMA  Follow up: No follow-ups on file.

## 2022-08-14 ENCOUNTER — Telehealth: Payer: Self-pay

## 2022-08-14 NOTE — Telephone Encounter (Signed)
Return call to [facility] nurse for patient about sp tube placement. Everlean Alstrom states that patient has a hold in his penis and need sp tube placement. Made Everlean Alstrom aware to reach out to IR for sp tube placement . Everlean Alstrom voiced understanding

## 2022-08-18 ENCOUNTER — Other Ambulatory Visit: Payer: Self-pay | Admitting: Student

## 2022-08-18 DIAGNOSIS — N3000 Acute cystitis without hematuria: Secondary | ICD-10-CM

## 2022-08-18 DIAGNOSIS — E119 Type 2 diabetes mellitus without complications: Secondary | ICD-10-CM

## 2022-08-19 ENCOUNTER — Other Ambulatory Visit: Payer: Self-pay | Admitting: Student

## 2022-08-19 NOTE — H&P (Addendum)
Chief Complaint: Patient was seen in consultation today for suprapubic catheter placement at the request of Bjorn Pippin   Referring Physician(s): Bjorn Pippin   Supervising Physician: Irish Lack  Patient Status: [facility] - Out-pt  History of Present Illness: Garrett [name] is a 37 y.o. 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   Patient is a a ward of the state and used to live in a group home, he was admitted from 03/03/22 to 03/10/22 due to pseudomonas UTI, transaminitis, nausea, and urinary retention, he was discharged to assisted living with Foley catheter in place.  Patient was brought to [facility] ED on 04/27/22 due to FTT, patient stayed in ED until 2/15. 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  A suprapubic catheter was recommended to the patient recently by urology as the urethrocutaneous fistula may prevent future Foley placement.    ***  Past Medical History:  Diagnosis Date   Acute metabolic encephalopathy 03/09/2020   Altered mental status    Anemia 05/05/2021   Chronic Hepatitis B 05/12/2021   Chronic hepatitis B without hepatic coma (HCC) 01/14/2022   Depression    Diabetes mellitus (HCC)    Elevated LFTs 03/09/2020   Hypoalbuminemia 01/24/2022   Hyponatremia 01/24/2022   Hypotension 01/26/2022   Major depressive disorder, recurrent episode, moderate with mood-congruent psychotic features (HCC)    Major depressive disorder, recurrent severe without psychotic features (HCC) 06/20/2017   Physical deconditioning    Postoperative intra-abdominal abscess 01/20/2022   Severe malnutrition (HCC) 01/18/2022   Severe malnutrition (HCC) 01/18/2022    Past Surgical History:  Procedure  Laterality Date   APPENDECTOMY     EXPLORATORY LAPAROTOMY      Allergies: Patient has no known allergies.  Medications: Prior to Admission medications   Medication Sig Start Date End Date Taking? Authorizing Provider  Accu-Chek Softclix Lancets lancets Use to test blood sugars up to 4 times daily as directed. 07/11/21   Russella Dar, NP  acetaminophen (TYLENOL) 325 MG tablet Take 2 tablets (650 mg total) by mouth every 6 (six) hours as needed for mild pain or moderate pain. 07/11/21   Russella Dar, NP  ANTI-DIARRHEAL 2 MG tablet Take 2 mg by mouth as needed for diarrhea or loose stools. 02/22/22   [provider]  benztropine (COGENTIN) 0.5 MG tablet Take 1 tablet (0.5 mg total) by mouth 2 (two) times daily. Patient taking differently: Take 0.5 mg by mouth at bedtime. 07/11/21   Russella Dar, NP  Blood Glucose Monitoring Suppl (BLOOD GLUCOSE MONITOR SYSTEM) w/Device KIT Use to test blood sugars up to 4 times daily as directed. 07/11/21   Russella Dar, NP  escitalopram (LEXAPRO) 10 MG tablet Take 1 tablet (10 mg total) by mouth daily. 07/12/21   Russella Dar, NP  feeding supplement, GLUCERNA SHAKE, (GLUCERNA SHAKE) LIQD Take 237 mLs by mouth 3 (three) times daily between meals. 07/11/21   Russella Dar, NP  gabapentin (NEURONTIN) 100 MG capsule Take 1 capsule (100 mg total) by mouth 2 (two) times daily. 07/11/21   Russella Dar, NP  glucose blood (ACCU-CHEK GUIDE) test strip Use to check blood sugars up to 4 times  daily as directed. 07/11/21   Russella Dar, NP  insulin aspart (NOVOLOG) 100 UNIT/ML FlexPen Inject 0-6 Units into the skin 3 (three) times daily with meals. 07/11/21   Russella Dar, NP  insulin glargine (LANTUS) 100 UNIT/ML Solostar Pen Inject 10 Units into the skin daily. 03/10/22   Vassie Loll, MD  Insulin Pen Needle 32G X 4 MM MISC Use to inject insulin up to 4 times daily. 07/11/21   Russella Dar, NP  LORazepam (ATIVAN) 0.5 MG tablet Take 1  tablet (0.5 mg total) by mouth at bedtime. 07/11/21   Russella Dar, NP  miconazole nitrate (MICATIN) POWD Apply 1 application. topically 3 (three) times daily. 07/11/21   Russella Dar, NP  midodrine (PROAMATINE) 10 MG tablet Take 1 tablet (10 mg total) by mouth 3 (three) times daily with meals. 03/10/22   Vassie Loll, MD  ondansetron (ZOFRAN-ODT) 4 MG disintegrating tablet Take 1 tablet (4 mg total) by mouth every 8 (eight) hours as needed for up to 15 doses for nausea or vomiting. 02/27/22   Terald Sleeper, MD  promethazine (PHENERGAN) 25 MG suppository Place 1 suppository (25 mg total) rectally every 8 (eight) hours as needed for refractory nausea / vomiting. 03/10/22   Vassie Loll, MD  tamsulosin (FLOMAX) 0.4 MG CAPS capsule Take 1 capsule (0.4 mg total) by mouth daily. 02/09/22   Bjorn Pippin, MD     No family history on file.  Social History   Socioeconomic History   Marital status: Single    Spouse name: Not on file   Number of children: Not on file   Years of education: Not on file   Highest education level: Not on file  Occupational History   Not on file  Tobacco Use   Smoking status: Former    Types: Cigarettes   Smokeless tobacco: Never  Substance and Sexual Activity   Alcohol use: Not Currently    Comment: occassionally   Drug use: Not Currently   Sexual activity: Not on file  Other Topics Concern   Not on file  Social History Narrative   Not on file   Social Determinants of Health   Financial Resource Strain: Not on file  Food Insecurity: Not on file  Transportation Needs: Not on file  Physical Activity: Not on file  Stress: Not on file  Social Connections: Not on file     Review of Systems: A 12 point ROS discussed and pertinent positives are indicated in the HPI above.  All other systems are negative.  Vital Signs: There were no vitals taken for this visit.  *** Physical Exam     Imaging: No results found.  Labs:  CBC: Recent Labs     03/23/22 1815 03/31/22 1212 04/27/22 1212 05/03/22 0739  WBC 6.2 5.8 9.5 6.8  HGB 13.2 13.1 12.0* 13.0  HCT 40.3 40.9 37.7* 41.0  PLT 234 214 169 162    COAGS: No results for input(s): "INR", "APTT" in the last 8760 hours.  BMP: Recent Labs    03/31/22 1212 04/27/22 1212 05/03/22 0739 05/05/22 0748  NA 136 141 147* 143  K 3.7 4.2 4.0 3.6  CL 100 103 110 110  CO2 32 29 23 23   GLUCOSE 83 181* 96 97  BUN 9 25* 27* 21*  CALCIUM 8.3* 8.1* 8.6* 8.2*  CREATININE 0.61 0.80 0.81 0.64  GFRNONAA >60 >60 >60 >60    LIVER FUNCTION TESTS: Recent Labs    03/31/22 1212 04/27/22  1212 05/03/22 0739 05/05/22 0748  BILITOT 1.6* 1.6* 1.9* 1.3*  AST 95* 59* 89* 80*  ALT 62* 37 55* 49*  ALKPHOS 55 49 57 45  PROT 8.2* 7.7 8.9* 7.1  ALBUMIN 2.8* 2.6* 3.0* 2.6*    TUMOR MARKERS: No results for input(s): "AFPTM", "CEA", "CA199", "CHROMGRNA" in the last 8760 hours.  Assessment and Plan: 37 y.o. 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   NPO since MN VSS CBC INR Not on AC/AP NKDA  Risks and benefits discussed with the patient including bleeding, infection, and damage to adjacent structures.  All of the patient's questions were answered, patient is agreeable to proceed. Consent signed and in chart.   Thank you for this interesting consult.  I greatly enjoyed meeting Brandon Glanz and look forward to participating in their care.  A copy of this report was sent to the requesting provider on this date.  Electronically Signed: Willette Brace, PA-C 08/19/2022, 2:03 PM   I spent a total of  40 Minutes   in face to face in clinical consultation, greater than 50% of which was counseling/coordinating care for SP tube placement.   This chart was dictated using voice recognition software.  Despite best efforts to proofread,  errors can occur which can change the documentation  meaning.

## 2022-08-21 ENCOUNTER — Other Ambulatory Visit: Payer: Self-pay

## 2022-08-21 ENCOUNTER — Other Ambulatory Visit: Payer: Self-pay | Admitting: Radiology

## 2022-08-21 ENCOUNTER — Ambulatory Visit (HOSPITAL_COMMUNITY)
Admission: RE | Admit: 2022-08-21 | Discharge: 2022-08-21 | Disposition: A | Discharge status: Home / Self Care | Payer: Medicaid Other | Source: Ambulatory Visit | Attending: Emergency Medicine | Admitting: Emergency Medicine

## 2022-08-21 ENCOUNTER — Encounter (HOSPITAL_COMMUNITY): Payer: Self-pay

## 2022-08-21 DIAGNOSIS — N3000 Acute cystitis without hematuria: Secondary | ICD-10-CM

## 2022-08-21 DIAGNOSIS — N360 Urethral fistula: Secondary | ICD-10-CM | POA: Insufficient documentation

## 2022-08-21 DIAGNOSIS — R339 Retention of urine, unspecified: Secondary | ICD-10-CM | POA: Insufficient documentation

## 2022-08-21 DIAGNOSIS — E119 Type 2 diabetes mellitus without complications: Secondary | ICD-10-CM

## 2022-08-21 LAB — PROTIME-INR
INR: 1.1 (ref 0.8–1.2)
Prothrombin Time: 14.2 seconds (ref 11.4–15.2)

## 2022-08-21 LAB — CBC
HCT: 33.2 % — ABNORMAL LOW (ref 39.0–52.0)
Hemoglobin: 10.6 g/dL — ABNORMAL LOW (ref 13.0–17.0)
MCH: 30.8 pg (ref 26.0–34.0)
MCHC: 31.9 g/dL (ref 30.0–36.0)
MCV: 96.5 fL (ref 80.0–100.0)
Platelets: 112 10*3/uL — ABNORMAL LOW (ref 150–400)
RBC: 3.44 MIL/uL — ABNORMAL LOW (ref 4.22–5.81)
RDW: 13.1 % (ref 11.5–15.5)
WBC: 4.5 10*3/uL (ref 4.0–10.5)
nRBC: 0 % (ref 0.0–0.2)

## 2022-08-21 LAB — GLUCOSE, CAPILLARY: Glucose-Capillary: 101 mg/dL — ABNORMAL HIGH (ref 70–99)

## 2022-08-21 MED ORDER — SODIUM CHLORIDE 0.9 % IV SOLN
2.0000 g | INTRAVENOUS | Status: AC
  Administered 2022-08-21: 2 g via INTRAVENOUS
  Filled 2022-08-21: qty 20

## 2022-08-21 MED ORDER — LIDOCAINE-EPINEPHRINE 1 %-1:100000 IJ SOLN
20.0000 mL | Freq: Once | INTRAMUSCULAR | Status: AC
  Administered 2022-08-21: 20 mL via INTRADERMAL

## 2022-08-21 MED ORDER — HYDROCODONE-ACETAMINOPHEN 5-325 MG PO TABS: 1.0000 | ORAL_TABLET | ORAL | Status: DC | PRN

## 2022-08-21 MED ORDER — SODIUM CHLORIDE 0.9 % IV SOLN
INTRAVENOUS | Status: AC
  Filled 2022-08-21: qty 20

## 2022-08-21 MED ORDER — MIDAZOLAM HCL 2 MG/2ML IJ SOLN
INTRAMUSCULAR | Status: AC
  Filled 2022-08-21: qty 2

## 2022-08-21 MED ORDER — SODIUM CHLORIDE 0.9 % IV SOLN
INTRAVENOUS | Status: AC | PRN
  Administered 2022-08-21: 1000 mL/h via INTRAVENOUS

## 2022-08-21 MED ORDER — FENTANYL CITRATE (PF) 100 MCG/2ML IJ SOLN
INTRAMUSCULAR | Status: AC | PRN
  Administered 2022-08-21: 25 ug via INTRAVENOUS

## 2022-08-21 MED ORDER — FENTANYL CITRATE (PF) 100 MCG/2ML IJ SOLN
INTRAMUSCULAR | Status: AC
  Filled 2022-08-21: qty 2

## 2022-08-21 MED ORDER — MIDAZOLAM HCL 2 MG/2ML IJ SOLN
INTRAMUSCULAR | Status: AC | PRN
  Administered 2022-08-21: 1 mg via INTRAVENOUS

## 2022-08-21 MED ORDER — SODIUM CHLORIDE 0.9 % IV SOLN: INTRAVENOUS | Status: DC

## 2022-08-21 NOTE — Procedures (Signed)
Interventional Radiology Procedure:   Indications: Urethrocutaneous fistula  Procedure: Suprapubic catheter placement  Findings: 16 Fr tube placed.  Foley was removed.    Complications: None     EBL: Minimal  Plan: Discharge in 3 hours.  Plan for conversion to balloon retention catheter in 8 weeks.   Marline Morace R. Lowella Dandy, MD  Pager: [phone]

## 2022-08-31 ENCOUNTER — Ambulatory Visit (INDEPENDENT_AMBULATORY_CARE_PROVIDER_SITE_OTHER): Payer: Medicaid Other | Admitting: Urology

## 2022-08-31 ENCOUNTER — Encounter: Payer: Self-pay | Admitting: Urology

## 2022-08-31 VITALS — BP 102/71 | HR 83

## 2022-08-31 DIAGNOSIS — N360 Urethral fistula: Secondary | ICD-10-CM

## 2022-08-31 DIAGNOSIS — R339 Retention of urine, unspecified: Secondary | ICD-10-CM | POA: Diagnosis not present

## 2022-08-31 NOTE — Progress Notes (Signed)
Subjective: 1. Urinary retention   2. 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  He is tolerating the SP tube well.   06/01/22: Garrett [name] returns today in f/u after being hopsitalized on 04/27/22 for acute cystitis.  He was in the ER throughout his stay until 05/18/22.   I spoke to Dr. 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   He reports some abdominal pain but no penile pain.   Garrett [name] is a 37 yo male with a history of diabetes without neuropathy symptoms.  He was at [facility] for a ruptured appendix in October.   He was back in the ER on 10/30 for abdominal pain and diarrhea and was found on CT to have urinary retention but his CT on  01/25/22 also showed retention.  He still has a foley in place.   He also had penile edema and severe phimosis as well.  He had some difficulty voiding for some time prior to his ER visit.  He had no prior evaluation or treatment for the voiding.     ROS:  Review of Systems  Unable to perform ROS: Language    No Known Allergies  Past Medical History:  Diagnosis Date   Acute metabolic encephalopathy 03/09/2020   Altered mental status    Anemia 05/05/2021   Chronic Hepatitis B 05/12/2021   Chronic hepatitis B without hepatic coma (HCC) 01/14/2022   Depression    Diabetes mellitus (HCC)    Elevated LFTs 03/09/2020   Hypoalbuminemia 01/24/2022   Hyponatremia 01/24/2022   Hypotension 01/26/2022   Major depressive disorder, recurrent episode, moderate with mood-congruent psychotic features (HCC)    Major depressive disorder, recurrent severe without psychotic features (HCC) 06/20/2017   Physical deconditioning    Postoperative intra-abdominal abscess 01/20/2022   Severe malnutrition (HCC) 01/18/2022   Severe  malnutrition (HCC) 01/18/2022    Past Surgical History:  Procedure Laterality Date   APPENDECTOMY     EXPLORATORY LAPAROTOMY      Social History   Socioeconomic History   Marital status: Single    Spouse name: Not on file   Number of children: Not on file   Years of education: Not on file   Highest education level: Not on file  Occupational History   Not on file  Tobacco Use   Smoking status: Former    Types: Cigarettes   Smokeless tobacco: Never  Substance and Sexual Activity   Alcohol use: Not Currently    Comment: occassionally   Drug use: Not Currently   Sexual activity: Not on file  Other Topics Concern   Not on file  Social History Narrative   Not on file   Social Determinants of Health   Financial Resource Strain: Not on file  Food Insecurity: Not on file  Transportation Needs: Not on file  Physical Activity: Not on file  Stress: Not on file  Social Connections: Not on file  Intimate Partner Violence: Not on file    History reviewed. No pertinent family history.  Anti-infectives: Anti-infectives (From admission, onward)    None       Current Outpatient Medications  Medication Sig Dispense Refill   Accu-Chek Softclix Lancets lancets Use to  test blood sugars up to 4 times daily as directed. 100 each 0   acetaminophen (TYLENOL) 325 MG tablet Take 2 tablets (650 mg total) by mouth every 6 (six) hours as needed for mild pain or moderate pain. 7 tablet 0   ANTI-DIARRHEAL 2 MG tablet Take 2 mg by mouth as needed for diarrhea or loose stools.     benztropine (COGENTIN) 0.5 MG tablet Take 1 tablet (0.5 mg total) by mouth 2 (two) times daily. (Patient taking differently: Take 0.5 mg by mouth at bedtime.) 7 tablet 0   Blood Glucose Monitoring Suppl (BLOOD GLUCOSE MONITOR SYSTEM) w/Device KIT Use to test blood sugars up to 4 times daily as directed. 1 kit 0   escitalopram (LEXAPRO) 10 MG tablet Take 1 tablet (10 mg total) by mouth daily. 7 tablet 0   feeding  supplement, GLUCERNA SHAKE, (GLUCERNA SHAKE) LIQD Take 237 mLs by mouth 3 (three) times daily between meals. 237 mL 0   gabapentin (NEURONTIN) 100 MG capsule Take 1 capsule (100 mg total) by mouth 2 (two) times daily. 14 capsule 0   glucose blood (ACCU-CHEK GUIDE) test strip Use to check blood sugars up to 4 times daily as directed. 100 each 0   insulin aspart (NOVOLOG) 100 UNIT/ML FlexPen Inject 0-6 Units into the skin 3 (three) times daily with meals. 3 mL 3   insulin glargine (LANTUS) 100 UNIT/ML Solostar Pen Inject 10 Units into the skin daily. 3 mL 3   Insulin Pen Needle 32G X 4 MM MISC Use to inject insulin up to 4 times daily. 100 each 0   LORazepam (ATIVAN) 0.5 MG tablet Take 1 tablet (0.5 mg total) by mouth at bedtime. 7 tablet 0   miconazole nitrate (MICATIN) POWD Apply 1 application. topically 3 (three) times daily. 5 g 0   midodrine (PROAMATINE) 10 MG tablet Take 1 tablet (10 mg total) by mouth 3 (three) times daily with meals. 90 tablet 0   ondansetron (ZOFRAN-ODT) 4 MG disintegrating tablet Take 1 tablet (4 mg total) by mouth every 8 (eight) hours as needed for up to 15 doses for nausea or vomiting. 15 tablet 0   promethazine (PHENERGAN) 25 MG suppository Place 1 suppository (25 mg total) rectally every 8 (eight) hours as needed for refractory nausea / vomiting. 20 each 0   tamsulosin (FLOMAX) 0.4 MG CAPS capsule Take 1 capsule (0.4 mg total) by mouth daily. 30 capsule 11   No current facility-administered medications for this visit.     Objective: Vital signs in last 24 hours: BP 102/71   Pulse 83   Intake/Output from previous day: No intake/output data recorded. Intake/Output this shift: @IOTHISSHIFT @   Physical Exam Vitals reviewed.  Constitutional:      Appearance: He is ill-appearing.  Genitourinary:    Comments: He has an uncircumcised phallus with significant closure of the proximal fistula but there is still some leakage from the site but it is clean.       Lab Results:  No results found for this or any previous visit (from the past 24 hour(s)).  BMET No results for input(s): "NA", "K", "CL", "CO2", "GLUCOSE", "BUN", "CREATININE", "CALCIUM" in the last 72 hours. PT/INR No results for input(s): "LABPROT", "INR" in the last 72 hours. ABG No results for input(s): "PHART", "HCO3" in the last 72 hours.  Invalid input(s): "PCO2", "PO2"   Studies/Results: No results found.     Assessment/Plan: Urethrocutaneous fistula.  Improving with the SP Tube.   Urinary retention.  Change SP tube q4wks.   Phimosis.   He has moderate to severe phimosis but no balanitis.  I will monitor this situation.   No orders of the defined types were placed in this encounter.    No orders of the defined types were placed in this encounter.    Return in about 3 weeks (around 09/21/2022) for Needs f/u NV for SP tube change.   .    CC: Rush Farmer NP     Garrett [name] 09/01/2022

## 2022-09-21 ENCOUNTER — Ambulatory Visit: Payer: Medicaid Other

## 2022-09-21 DIAGNOSIS — R339 Retention of urine, unspecified: Secondary | ICD-10-CM

## 2022-10-02 ENCOUNTER — Ambulatory Visit: Payer: Medicaid Other

## 2022-10-12 ENCOUNTER — Ambulatory Visit (INDEPENDENT_AMBULATORY_CARE_PROVIDER_SITE_OTHER): Payer: Medicaid Other | Admitting: Podiatry

## 2022-10-12 DIAGNOSIS — E1142 Type 2 diabetes mellitus with diabetic polyneuropathy: Secondary | ICD-10-CM

## 2022-10-12 DIAGNOSIS — B351 Tinea unguium: Secondary | ICD-10-CM | POA: Diagnosis not present

## 2022-10-12 DIAGNOSIS — M79674 Pain in right toe(s): Secondary | ICD-10-CM | POA: Diagnosis not present

## 2022-10-12 DIAGNOSIS — M79675 Pain in left toe(s): Secondary | ICD-10-CM

## 2022-10-12 NOTE — Progress Notes (Signed)
  Subjective:  Patient ID: Garrett [name], male    DOB: 1986/03/22,  MRN: [id number]  Chief Complaint  Patient presents with   Nail Problem    Diabetic Foot Care-nail trim     37 y.o. male presents with the above complaint. History confirmed with patient. Patient presenting with pain related to dystrophic thickened elongated nails. Patient is unable to trim own nails related to nail dystrophy and/or mobility issues. Patient does  have a history of T2DM.   Objective:  Physical Exam: warm, good capillary refill nail exam onychomycosis of the toenails, onycholysis, and dystrophic nails, xerosis of the skin DP pulses palpable, PT pulses palpable, and protective sensation diminished Left Foot:  Pain with palpation of nails due to elongation and dystrophic growth.  Right Foot: Pain with palpation of nails due to elongation and dystrophic growth.   Assessment:   1. Pain due to onychomycosis of toenails of both feet   2. DM type 2 with diabetic peripheral neuropathy (HCC)       Plan:  Patient was evaluated and treated and all questions answered.  #Onychomycosis with pain  -Nails palliatively debrided as below. -Educated on self-care  Procedure: Nail Debridement Rationale: Pain Type of Debridement: manual, sharp debridement. Instrumentation: Nail nipper, rotary burr. Number of Nails: 10  Return in about 3 months (around 01/12/2023) for [facility].         Corinna Gab, DPM [facility]

## 2022-10-23 ENCOUNTER — Ambulatory Visit: Payer: Medicaid Other

## 2022-11-01 ENCOUNTER — Ambulatory Visit (HOSPITAL_COMMUNITY)
Admission: RE | Admit: 2022-11-01 | Discharge: 2022-11-01 | Disposition: A | Discharge status: Home / Self Care | Payer: Medicaid Other | Source: Ambulatory Visit | Attending: Radiology | Admitting: Radiology

## 2022-11-01 ENCOUNTER — Encounter (HOSPITAL_COMMUNITY): Payer: Self-pay

## 2022-11-01 DIAGNOSIS — N3000 Acute cystitis without hematuria: Secondary | ICD-10-CM | POA: Insufficient documentation

## 2022-11-01 DIAGNOSIS — Z794 Long term (current) use of insulin: Secondary | ICD-10-CM | POA: Insufficient documentation

## 2022-11-01 DIAGNOSIS — E119 Type 2 diabetes mellitus without complications: Secondary | ICD-10-CM | POA: Diagnosis not present

## 2022-11-01 DIAGNOSIS — B181 Chronic viral hepatitis B without delta-agent: Secondary | ICD-10-CM | POA: Insufficient documentation

## 2022-11-01 DIAGNOSIS — F339 Major depressive disorder, recurrent, unspecified: Secondary | ICD-10-CM | POA: Insufficient documentation

## 2022-11-01 DIAGNOSIS — Z435 Encounter for attention to cystostomy: Secondary | ICD-10-CM | POA: Insufficient documentation

## 2022-11-01 DIAGNOSIS — R339 Retention of urine, unspecified: Secondary | ICD-10-CM | POA: Diagnosis not present

## 2022-11-01 DIAGNOSIS — N4889 Other specified disorders of penis: Secondary | ICD-10-CM | POA: Diagnosis not present

## 2022-11-01 HISTORY — PX: IR CATHETER TUBE CHANGE: IMG717

## 2022-11-01 LAB — GLUCOSE, CAPILLARY
Glucose-Capillary: 68 mg/dL — ABNORMAL LOW (ref 70–99)
Glucose-Capillary: 126 mg/dL — ABNORMAL HIGH (ref 70–99)
Glucose-Capillary: 83 mg/dL (ref 70–99)

## 2022-11-01 MED ORDER — DEXTROSE 50 % IV SOLN: 25.0000 mL | Freq: Once | INTRAVENOUS | Status: AC

## 2022-11-01 MED ORDER — LIDOCAINE VISCOUS HCL 2 % MT SOLN
OROMUCOSAL | Status: AC
  Filled 2022-11-01: qty 15

## 2022-11-01 MED ORDER — DEXTROSE 50 % IV SOLN
INTRAVENOUS | Status: AC
  Administered 2022-11-01: 25 mL via INTRAVENOUS
  Filled 2022-11-01: qty 50

## 2022-11-01 MED ORDER — SODIUM CHLORIDE 0.9 % IV SOLN
2.0000 g | INTRAVENOUS | Status: AC
  Administered 2022-11-01: 2 g via INTRAVENOUS

## 2022-11-01 MED ORDER — IOHEXOL 300 MG/ML  SOLN: 50.0000 mL | Freq: Once | INTRAMUSCULAR | Status: DC | PRN

## 2022-11-01 MED ORDER — FENTANYL CITRATE (PF) 100 MCG/2ML IJ SOLN
INTRAMUSCULAR | Status: AC | PRN
Start: 2022-11-01 — End: 2022-11-01
  Administered 2022-11-01: 50 ug via INTRAVENOUS

## 2022-11-01 MED ORDER — SODIUM CHLORIDE 0.9 % IV SOLN
INTRAVENOUS | Status: AC
  Filled 2022-11-01: qty 20

## 2022-11-01 MED ORDER — MIDAZOLAM HCL 2 MG/2ML IJ SOLN
INTRAMUSCULAR | Status: AC | PRN
  Administered 2022-11-01: 1 mg via INTRAVENOUS

## 2022-11-01 MED ORDER — FENTANYL CITRATE (PF) 100 MCG/2ML IJ SOLN
INTRAMUSCULAR | Status: AC
  Filled 2022-11-01: qty 2

## 2022-11-01 MED ORDER — MIDAZOLAM HCL 2 MG/2ML IJ SOLN
INTRAMUSCULAR | Status: AC
  Filled 2022-11-01: qty 2

## 2022-11-01 NOTE — Progress Notes (Signed)
Note from [facility] RN was my note entering, placed accidentally under her name.

## 2022-11-01 NOTE — H&P (Signed)
Chief Complaint: Patient was seen in consultation today for suprapubic catheter placement at the request of Garrett [name]   Referring Physician(s): Garrett [name]   Supervising Physician: Garrett [name]  Patient Status: Garrett [name] - Out-pt  History of Present Illness: Garrett [name] is a 37 y.o. 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   Patient is a a ward of the state and used to live in a group home, he was admitted from 03/03/22 to 03/10/22 due to pseudomonas UTI, transaminitis, nausea, and urinary retention, he was discharged to assisted living with Foley catheter in place.  Patient was brought to [facility] ED on 04/27/22 due to FTT, patient stayed in ED until 2/15. 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  A suprapubic catheter was recommended to the patient recently by urology as the urethrocutaneous fistula may prevent future Foley placement.   Patient underwent 16 Fr SP cath placement by Dr. Lowella Dandy on 08/30/22, he presents for catheter exchange to 16 Fr Council tip tcatheter today.   Patient laying in bed, not in acute distress. Audio interpreter # [phone], patient speaks Garrett [name].  Patient answers only yes and no to questions, unclear if he has understanding of the situation.  ROS not obtained.   Case discussed with the legal guardian Garrett [name] from [facility] DSS. Risks and benefit discussed, consent obtained.    Past Medical History:  Diagnosis Date   Acute metabolic encephalopathy 03/09/2020   Altered mental status    Anemia 05/05/2021   Chronic Hepatitis B 05/12/2021   Chronic hepatitis B without hepatic coma (HCC) 01/14/2022   Depression    Diabetes  mellitus (HCC)    Elevated LFTs 03/09/2020   Hypoalbuminemia 01/24/2022   Hyponatremia 01/24/2022   Hypotension 01/26/2022   Major depressive disorder, recurrent episode, moderate with mood-congruent psychotic features (HCC)    Major depressive disorder, recurrent severe without psychotic features (HCC) 06/20/2017   Physical deconditioning    Postoperative intra-abdominal abscess 01/20/2022   Severe malnutrition (HCC) 01/18/2022   Severe malnutrition (HCC) 01/18/2022    Past Surgical History:  Procedure Laterality Date   APPENDECTOMY     EXPLORATORY LAPAROTOMY      Allergies: Patient has no known allergies.  Medications: Prior to Admission medications   Medication Sig Start Date End Date Taking? Authorizing Provider  Accu-Chek Softclix Lancets lancets Use to test blood sugars up to 4 times daily as directed. 07/11/21   Russella Dar, NP  acetaminophen (TYLENOL) 325 MG tablet Take 2 tablets (650 mg total) by mouth every 6 (six) hours as needed for mild pain or moderate pain. 07/11/21   Russella Dar, NP  ANTI-DIARRHEAL 2 MG tablet Take 2 mg by mouth as needed for diarrhea or loose stools. 02/22/22   [provider]  benztropine (COGENTIN) 0.5 MG tablet Take 1 tablet (0.5 mg total) by mouth 2 (two) times daily. Patient taking differently: Take 0.5 mg by mouth at bedtime. 07/11/21   Russella Dar, NP  Blood Glucose Monitoring Suppl (BLOOD GLUCOSE MONITOR SYSTEM) w/Device KIT Use to test blood sugars up to 4 times daily as directed. 07/11/21   Russella Dar,  NP  escitalopram (LEXAPRO) 10 MG tablet Take 1 tablet (10 mg total) by mouth daily. 07/12/21   Russella Dar, NP  feeding supplement, GLUCERNA SHAKE, (GLUCERNA SHAKE) LIQD Take 237 mLs by mouth 3 (three) times daily between meals. 07/11/21   Russella Dar, NP  gabapentin (NEURONTIN) 100 MG capsule Take 1 capsule (100 mg total) by mouth 2 (two) times daily. 07/11/21   Russella Dar, NP  glucose blood (ACCU-CHEK  GUIDE) test strip Use to check blood sugars up to 4 times daily as directed. 07/11/21   Russella Dar, NP  insulin aspart (NOVOLOG) 100 UNIT/ML FlexPen Inject 0-6 Units into the skin 3 (three) times daily with meals. 07/11/21   Russella Dar, NP  insulin glargine (LANTUS) 100 UNIT/ML Solostar Pen Inject 10 Units into the skin daily. 03/10/22   Vassie Loll, MD  Insulin Pen Needle 32G X 4 MM MISC Use to inject insulin up to 4 times daily. 07/11/21   Russella Dar, NP  LORazepam (ATIVAN) 0.5 MG tablet Take 1 tablet (0.5 mg total) by mouth at bedtime. 07/11/21   Russella Dar, NP  miconazole nitrate (MICATIN) POWD Apply 1 application. topically 3 (three) times daily. 07/11/21   Russella Dar, NP  midodrine (PROAMATINE) 10 MG tablet Take 1 tablet (10 mg total) by mouth 3 (three) times daily with meals. 03/10/22   Vassie Loll, MD  ondansetron (ZOFRAN-ODT) 4 MG disintegrating tablet Take 1 tablet (4 mg total) by mouth every 8 (eight) hours as needed for up to 15 doses for nausea or vomiting. 02/27/22   Terald Sleeper, MD  promethazine (PHENERGAN) 25 MG suppository Place 1 suppository (25 mg total) rectally every 8 (eight) hours as needed for refractory nausea / vomiting. 03/10/22   Vassie Loll, MD  tamsulosin (FLOMAX) 0.4 MG CAPS capsule Take 1 capsule (0.4 mg total) by mouth daily. 02/09/22   Garrett Pippin, MD     History reviewed. No pertinent family history.  Social History   Socioeconomic History   Marital status: Single    Spouse name: Not on file   Number of children: Not on file   Years of education: Not on file   Highest education level: Not on file  Occupational History   Not on file  Tobacco Use   Smoking status: Former    Types: Cigarettes   Smokeless tobacco: Never  Substance and Sexual Activity   Alcohol use: Not Currently    Comment: occassionally   Drug use: Not Currently   Sexual activity: Not on file  Other Topics Concern   Not on file  Social History  Narrative   Not on file   Social Determinants of Health   Financial Resource Strain: Low Risk  (01/17/2022)   Received from [name], [facility]   Overall Financial Resource Strain (CARDIA)    Difficulty of Paying Living Expenses: Not very hard  Food Insecurity: No Food Insecurity (01/17/2022)   Received from [name], [facility]   Hunger Vital Sign    Worried About Running Out of Food in the Last Year: Never true    Ran Out of Food in the Last Year: Never true  Transportation Needs: No Transportation Needs (01/17/2022)   Received from [name], [facility]   [name] - Transportation    Lack of Transportation (Medical): No    Lack of Transportation (Non-Medical): No  Physical Activity: Not on file  Stress: Not on  file  Social Connections: Not on file     Review of Systems: A 12 point ROS discussed and pertinent positives are indicated in the HPI above.  All other systems are negative.  Vital Signs: BP 112/86   Pulse 87   Temp 98.9 F (37.2 C) (Oral)   Resp 16   Ht 5\' 2"  (1.575 m)   Wt 104 lb 15 oz (47.6 kg)   SpO2 90%   BMI 19.19 kg/m    Physical Exam Vitals reviewed.  Constitutional:      General: He is not in acute distress.    Comments: Frail, underweight   HENT:     Head: Normocephalic.     Mouth/Throat:     Mouth: Mucous membranes are moist.     Pharynx: Oropharynx is clear.     Comments: Missing several teeth  Cardiovascular:     Rate and Rhythm: Normal rate and regular rhythm.     Heart sounds: Normal heart sounds.  Pulmonary:     Effort: Pulmonary effort is normal. No respiratory distress.     Breath sounds: Normal breath sounds. No stridor.  Abdominal:     General: Abdomen is flat. Bowel sounds are normal.     Palpations: Abdomen is soft.  Genitourinary:    Comments: + SP tube, brown urine in the collecting bag.  Skin:    General: Skin is warm and dry.     Coloration: Skin is not jaundiced or pale.   Neurological:     Mental Status: He is alert.  Psychiatric:        Mood and Affect: Mood normal.        Behavior: Behavior normal.     MD Evaluation Airway: WNL Heart: WNL Abdomen: WNL Chest/ Lungs: WNL ASA  Classification: 3 Mallampati/Airway Score: Two  Imaging: No results found.  Labs:  CBC: Recent Labs    03/31/22 1212 04/27/22 1212 05/03/22 0739 08/21/22 0804  WBC 5.8 9.5 6.8 4.5  HGB 13.1 12.0* 13.0 10.6*  HCT 40.9 37.7* 41.0 33.2*  PLT 214 169 162 112*    COAGS: Recent Labs    08/21/22 0804  INR 1.1    BMP: Recent Labs    03/31/22 1212 04/27/22 1212 05/03/22 0739 05/05/22 0748  NA 136 141 147* 143  K 3.7 4.2 4.0 3.6  CL 100 103 110 110  CO2 32 29 23 23   GLUCOSE 83 181* 96 97  BUN 9 25* 27* 21*  CALCIUM 8.3* 8.1* 8.6* 8.2*  CREATININE 0.61 0.80 0.81 0.64  GFRNONAA >60 >60 >60 >60    LIVER FUNCTION TESTS: Recent Labs    03/31/22 1212 04/27/22 1212 05/03/22 0739 05/05/22 0748  BILITOT 1.6* 1.6* 1.9* 1.3*  AST 95* 59* 89* 80*  ALT 62* 37 55* 49*  ALKPHOS 55 49 57 45  PROT 8.2* 7.7 8.9* 7.1  ALBUMIN 2.8* 2.6* 3.0* 2.6*    TUMOR MARKERS: No results for input(s): "AFPTM", "CEA", "CA199", "CHROMGRNA" in the last 8760 hours.  Assessment and Plan: 37 y.o. 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   NPO since MN VSS Not on AC/AP NKDA  Urine in the collecting bag is dark brown, per chart patient is on Macrobid.  Discussed with Dr. Grace Isaac, will give 2 g rocephin. RN notified, order placed.   Patient's egal guardian Garrett [name] from was  called.  Risks and benefits discussed with the patient's legal guardian via telephone including bleeding, infection, and damage to adjacent structures, accidental lose of the catheter which may require new SP tube placement.   All of the legal guardian's  questions were answered, there is an agreement to proceed. Consent signed and in IR.    Thank you for this interesting consult.  I greatly enjoyed meeting Giovonni Minion and look forward to participating in their care.  A copy of this report was sent to the requesting provider on this date.  Electronically Signed: Willette Brace, PA-C 11/01/2022, 1:12 PM   I spent a total of  40 Minutes   in face to face in clinical consultation, greater than 50% of which was counseling/coordinating care for SP tube placement.   This chart was dictated using voice recognition software.  Despite best efforts to proofread,  errors can occur which can change the documentation meaning.

## 2022-11-01 NOTE — Procedures (Signed)
Pre procedural Dx: Urinary retention Post procedural Dx: Same  Technically successful fluoro guided exchange and conversion to a Council Tip suprapubic catheter.  EBL: Trace Complications: None immediate  Katherina Right, MD Pager #: [phone]

## 2022-11-01 NOTE — Progress Notes (Signed)
Report called to nurse [place] at [facility] . Vss and patient appears comfortable.

## 2022-11-01 NOTE — Progress Notes (Signed)
I have called [facility] x 3 without being able to speak with a nurse regarding medications taken today, will inform IR

## 2022-12-03 ENCOUNTER — Other Ambulatory Visit: Payer: Self-pay

## 2022-12-03 ENCOUNTER — Emergency Department (HOSPITAL_COMMUNITY): Payer: Medicaid Other

## 2022-12-03 ENCOUNTER — Emergency Department (HOSPITAL_COMMUNITY)
Admission: EM | Admit: 2022-12-03 | Discharge: 2022-12-04 | Disposition: A | Discharge status: Home / Self Care | Payer: Medicaid Other | Attending: Emergency Medicine | Admitting: Emergency Medicine

## 2022-12-03 DIAGNOSIS — R197 Diarrhea, unspecified: Secondary | ICD-10-CM | POA: Diagnosis not present

## 2022-12-03 DIAGNOSIS — E119 Type 2 diabetes mellitus without complications: Secondary | ICD-10-CM | POA: Diagnosis not present

## 2022-12-03 DIAGNOSIS — Z87891 Personal history of nicotine dependence: Secondary | ICD-10-CM | POA: Diagnosis not present

## 2022-12-03 DIAGNOSIS — R1031 Right lower quadrant pain: Secondary | ICD-10-CM | POA: Diagnosis present

## 2022-12-03 DIAGNOSIS — Z7984 Long term (current) use of oral hypoglycemic drugs: Secondary | ICD-10-CM | POA: Insufficient documentation

## 2022-12-03 DIAGNOSIS — Z794 Long term (current) use of insulin: Secondary | ICD-10-CM | POA: Diagnosis not present

## 2022-12-03 DIAGNOSIS — N3001 Acute cystitis with hematuria: Secondary | ICD-10-CM

## 2022-12-03 LAB — COMPREHENSIVE METABOLIC PANEL
ALT: 51 U/L — ABNORMAL HIGH (ref 0–44)
AST: 52 U/L — ABNORMAL HIGH (ref 15–41)
Albumin: 3.2 g/dL — ABNORMAL LOW (ref 3.5–5.0)
Alkaline Phosphatase: 57 U/L (ref 38–126)
Anion gap: 8 (ref 5–15)
BUN: 20 mg/dL (ref 6–20)
CO2: 27 mmol/L (ref 22–32)
Calcium: 8.5 mg/dL — ABNORMAL LOW (ref 8.9–10.3)
Chloride: 101 mmol/L (ref 98–111)
Creatinine, Ser: 0.76 mg/dL (ref 0.61–1.24)
GFR, Estimated: >60 mL/min (ref >60)
Glucose, Bld: 115 mg/dL — ABNORMAL HIGH (ref 70–99)
Potassium: 3.3 mmol/L — ABNORMAL LOW (ref 3.5–5.1)
Sodium: 136 mmol/L (ref 135–145)
Total Bilirubin: 0.7 mg/dL (ref 0.3–1.2)
Total Protein: 8.1 g/dL (ref 6.5–8.1)

## 2022-12-03 LAB — CBC WITH DIFFERENTIAL/PLATELET
Abs Immature Granulocytes: 0.03 10*3/uL (ref 0.00–0.07)
Basophils Absolute: 0 10*3/uL (ref 0.0–0.1)
Basophils Relative: 0 %
Eosinophils Absolute: 0.2 10*3/uL (ref 0.0–0.5)
Eosinophils Relative: 2 %
HCT: 35.5 % — ABNORMAL LOW (ref 39.0–52.0)
Hemoglobin: 12 g/dL — ABNORMAL LOW (ref 13.0–17.0)
Immature Granulocytes: 0 %
Lymphocytes Relative: 31 %
Lymphs Abs: 2.8 10*3/uL (ref 0.7–4.0)
MCH: 31.5 pg (ref 26.0–34.0)
MCHC: 33.8 g/dL (ref 30.0–36.0)
MCV: 93.2 fL (ref 80.0–100.0)
Monocytes Absolute: 0.7 10*3/uL (ref 0.1–1.0)
Monocytes Relative: 8 %
Neutro Abs: 5.3 10*3/uL (ref 1.7–7.7)
Neutrophils Relative %: 59 %
Platelets: 221 10*3/uL (ref 150–400)
RBC: 3.81 MIL/uL — ABNORMAL LOW (ref 4.22–5.81)
RDW: 13.1 % (ref 11.5–15.5)
WBC: 8.9 10*3/uL (ref 4.0–10.5)
nRBC: 0 % (ref 0.0–0.2)

## 2022-12-03 LAB — URINALYSIS, ROUTINE W REFLEX MICROSCOPIC
Bilirubin Urine: NEGATIVE
Glucose, UA: NEGATIVE mg/dL
Ketones, ur: NEGATIVE mg/dL
Nitrite: POSITIVE — AB
Protein, ur: 100 mg/dL — AB
RBC / HPF: >50 RBC/hpf (ref 0–5)
Specific Gravity, Urine: 1.02 (ref 1.005–1.030)
WBC, UA: >50 WBC/hpf (ref 0–5)
pH: 6 (ref 5.0–8.0)

## 2022-12-03 LAB — LIPASE, BLOOD: Lipase: 59 U/L — ABNORMAL HIGH (ref 11–51)

## 2022-12-03 LAB — MAGNESIUM: Magnesium: 2.1 mg/dL (ref 1.7–2.4)

## 2022-12-03 MED ORDER — IOHEXOL 300 MG/ML  SOLN
80.0000 mL | Freq: Once | INTRAMUSCULAR | Status: AC | PRN
  Administered 2022-12-03: 80 mL via INTRAVENOUS

## 2022-12-03 MED ORDER — SODIUM CHLORIDE 0.9 % IV BOLUS
1000.0000 mL | Freq: Once | INTRAVENOUS | Status: AC
  Administered 2022-12-03: 1000 mL via INTRAVENOUS

## 2022-12-03 MED ORDER — NITROFURANTOIN MONOHYD MACRO 100 MG PO CAPS: 100.0000 mg | ORAL_CAPSULE | Freq: Two times a day (BID) | ORAL | 0 refills | Status: DC

## 2022-12-03 NOTE — ED Notes (Signed)
Patient transported to CT 

## 2022-12-03 NOTE — Discharge Instructions (Signed)
The patient was seen and evaluated for suprapubic catheter pain and found to have a urinary tract infection. A prescription for Macrobid was called to the pharmacy.   He was also evaluated for abdominal pain and diarrhea. Labs and an abdominal CT scan were performed and no serious or life threatening condition was identified. If diarrhea persists, and if he is having more than 5 bowel movements a day, recommend LoMotil. His doctor can arrange this in the facility.

## 2022-12-03 NOTE — ED Triage Notes (Addendum)
Patient was brought by EMS from [facility] relating "Patient's suprapubic catheter had been removed and they are not able to replace it in their facility." Translator utilized that he has had lower right quadrant belly pain for the past month and diarrhea for the past few days. Pt denies nausea, fever, and vomiting.

## 2022-12-03 NOTE — ED Provider Notes (Signed)
[place] [facility] AT [facility] Provider Note   CSN: [id number] Arrival date & time: 12/03/22  1715     History  Chief Complaint  Patient presents with   suprapubic cather   Diarrhea    Garrett [name] is a 37 y.o. male.   Diarrhea   37 year old male presents emergency department with complaints of diarrhea as well as right lower abdominal pain.  Patient comes from [facility].  Patient states that he has had diarrhea for the past month or so.  Denies any recent antibiotic use, travel, hiking/camping.  Denies any known melena/hematochezia.  States that pain in his right lower abdomen began approximately 3 days ago.  States he has had no complications with his suprapubic catheter.  Denies any fever, nausea, vomit, chest pain, shortness of breath.  States has been eating and drinking appropriately.  Past medical history significant for major depressive disorder, diabetes mellitus type 2, metabolic encephalopathy, chronic hepatitis B, severe malnutrition  Home Medications Prior to Admission medications   Medication Sig Start Date End Date Taking? Authorizing Provider  Accu-Chek Softclix Lancets lancets Use to test blood sugars up to 4 times daily as directed. 07/11/21   Russella Dar, NP  acetaminophen (TYLENOL) 325 MG tablet Take 2 tablets (650 mg total) by mouth every 6 (six) hours as needed for mild pain or moderate pain. 07/11/21   Russella Dar, NP  ANTI-DIARRHEAL 2 MG tablet Take 2 mg by mouth as needed for diarrhea or loose stools. 02/22/22   [provider]  benztropine (COGENTIN) 0.5 MG tablet Take 1 tablet (0.5 mg total) by mouth 2 (two) times daily. Patient taking differently: Take 0.5 mg by mouth at bedtime. 07/11/21   Russella Dar, NP  Blood Glucose Monitoring Suppl (BLOOD GLUCOSE MONITOR SYSTEM) w/Device KIT Use to test blood sugars up to 4 times daily as directed. 07/11/21   Russella Dar, NP  escitalopram (LEXAPRO) 10 MG tablet  Take 1 tablet (10 mg total) by mouth daily. 07/12/21   Russella Dar, NP  feeding supplement, GLUCERNA SHAKE, (GLUCERNA SHAKE) LIQD Take 237 mLs by mouth 3 (three) times daily between meals. 07/11/21   Russella Dar, NP  gabapentin (NEURONTIN) 100 MG capsule Take 1 capsule (100 mg total) by mouth 2 (two) times daily. 07/11/21   Russella Dar, NP  glucose blood (ACCU-CHEK GUIDE) test strip Use to check blood sugars up to 4 times daily as directed. 07/11/21   Russella Dar, NP  insulin aspart (NOVOLOG) 100 UNIT/ML FlexPen Inject 0-6 Units into the skin 3 (three) times daily with meals. 07/11/21   Russella Dar, NP  insulin glargine (LANTUS) 100 UNIT/ML Solostar Pen Inject 10 Units into the skin daily. 03/10/22   Vassie Loll, MD  Insulin Pen Needle 32G X 4 MM MISC Use to inject insulin up to 4 times daily. 07/11/21   Russella Dar, NP  LORazepam (ATIVAN) 0.5 MG tablet Take 1 tablet (0.5 mg total) by mouth at bedtime. 07/11/21   Russella Dar, NP  miconazole nitrate (MICATIN) POWD Apply 1 application. topically 3 (three) times daily. 07/11/21   Russella Dar, NP  midodrine (PROAMATINE) 10 MG tablet Take 1 tablet (10 mg total) by mouth 3 (three) times daily with meals. 03/10/22   Vassie Loll, MD  nitrofurantoin, macrocrystal-monohydrate, (MACROBID) 100 MG capsule Take 100 mg by mouth 2 (two) times daily.    [provider]  ondansetron (ZOFRAN-ODT) 4 MG disintegrating  tablet Take 1 tablet (4 mg total) by mouth every 8 (eight) hours as needed for up to 15 doses for nausea or vomiting. 02/27/22   Terald Sleeper, MD  promethazine (PHENERGAN) 25 MG suppository Place 1 suppository (25 mg total) rectally every 8 (eight) hours as needed for refractory nausea / vomiting. 03/10/22   Vassie Loll, MD  tamsulosin (FLOMAX) 0.4 MG CAPS capsule Take 1 capsule (0.4 mg total) by mouth daily. 02/09/22   Bjorn Pippin, MD      Allergies    Patient has no known allergies.    Review of  Systems   Review of Systems  Gastrointestinal:  Positive for diarrhea.  All other systems reviewed and are negative.   Physical Exam Updated Vital Signs BP (!) 114/90 (BP Location: Right Arm)   Pulse 84   Temp 98.2 F (36.8 C) (Oral)   Resp 18   Ht 5\' 2"  (1.575 m)   Wt 47.6 kg   SpO2 100%   BMI 19.19 kg/m  Physical Exam Vitals and nursing note reviewed.  Constitutional:      General: He is not in acute distress.    Appearance: He is well-developed.     Comments: Cachectic appearing  HENT:     Head: Normocephalic and atraumatic.  Eyes:     Conjunctiva/sclera: Conjunctivae normal.  Cardiovascular:     Rate and Rhythm: Normal rate and regular rhythm.     Heart sounds: No murmur heard. Pulmonary:     Effort: Pulmonary effort is normal. No respiratory distress.     Breath sounds: Normal breath sounds.  Abdominal:     Palpations: Abdomen is soft.     Tenderness: There is abdominal tenderness. There is no guarding.     Comments: Right lower quadrant tenderness.  No CVA tenderness.  Suprapubic catheter in place without purulence, erythema, palpable fluctuance/induration.  Clear yellow urine in urine bag.  Musculoskeletal:        General: No swelling.     Cervical back: Neck supple.  Skin:    General: Skin is warm and dry.     Capillary Refill: Capillary refill takes less than 2 seconds.  Neurological:     Mental Status: He is alert.  Psychiatric:        Mood and Affect: Mood normal.     ED Results / Procedures / Treatments   Labs (all labs ordered are listed, but only abnormal results are displayed) Labs Reviewed  COMPREHENSIVE METABOLIC PANEL - Abnormal; Notable for the following components:      Result Value   Potassium 3.3 (*)    Glucose, Bld 115 (*)    Calcium 8.5 (*)    Albumin 3.2 (*)    AST 52 (*)    ALT 51 (*)    All other components within normal limits  CBC WITH DIFFERENTIAL/PLATELET - Abnormal; Notable for the following components:   RBC 3.81 (*)     Hemoglobin 12.0 (*)    HCT 35.5 (*)    All other components within normal limits  LIPASE, BLOOD - Abnormal; Notable for the following components:   Lipase 59 (*)    All other components within normal limits  MAGNESIUM  URINALYSIS, ROUTINE W REFLEX MICROSCOPIC    EKG None  Radiology No results found.  Procedures BLADDER CATHETERIZATION  Date/Time: 12/03/2022 7:24 PM  Performed by: Peter Garter, PA Authorized by: Peter Garter, PA   Consent:    Consent obtained:  Verbal   Consent  given by:  Patient   Risks, benefits, and alternatives were discussed: yes     Risks discussed:  Infection, incomplete procedure and false passage Universal protocol:    Procedure explained and questions answered to patient or proxy's satisfaction: yes     Patient identity confirmed:  Verbally with patient and arm band Pre-procedure details:    Procedure purpose:  Diagnostic   Preparation: Patient was prepped and draped in usual sterile fashion   Anesthesia:    Anesthesia method:  None Procedure details:    Provider performed due to:  Altered anatomy   Altered anatomy details: Suprapubic catheter.   Catheter insertion:  Indwelling   Catheter type:  Foley   Catheter size: 17.   Bladder irrigation: no     Number of attempts:  1   Urine characteristics:  Yellow Post-procedure details:    Procedure completion:  Tolerated well, no immediate complications     Medications Ordered in ED Medications  iohexol (OMNIPAQUE) 300 MG/ML solution 80 mL (has no administration in time range)  sodium chloride 0.9 % bolus 1,000 mL (1,000 mLs Intravenous New Bag/Given 12/03/22 1854)    ED Course/ Medical Decision Making/ A&P                                 Medical Decision Making Amount and/or Complexity of Data Reviewed Labs: ordered. Radiology: ordered.  Risk Prescription drug management.   This patient presents to the ED for concern of abdominal pain, this involves an extensive number  of treatment options, and is a complaint that carries with it a high risk of complications and morbidity.  The differential diagnosis includes gastritis, PUD, cholecystitis, CBD pathology, pancreatitis, SBO/LBO, volvulus, diverticulitis, appendicitis, cystitis, pyelonephritis, nephrolithiasis   Co morbidities that complicate the patient evaluation  See HPI   Additional history obtained:  Additional history obtained from EMR External records from outside source obtained and reviewed including hospital records   Lab Tests:  I Ordered, and personally interpreted labs.  The pertinent results include: No leukocytosis.  Evidence of anemia with a hemoglobin of 12.0.  Platelets within range.  Mild hypokalemia 3.3 as well as hypocalcemia of 8.5 otherwise, electrolytes within normal limits.  Mild nonspecific transaminitis of AST of 52 and ALT of 51.  No renal dysfunction.  Lipase mildly elevated at 59.  UA pending.   Imaging Studies ordered:  I ordered imaging studies including CT abdomen pelvis which is pending   Cardiac Monitoring: / EKG:  The patient was maintained on a cardiac monitor.  I personally viewed and interpreted the cardiac monitored which showed an underlying rhythm of: Sinus rhythm   Consultations Obtained:  N/a   Problem List / ED Course / Critical interventions / Medication management  Right lower abdominal pain, diarrhea I ordered medication including 1 L normal saline   Reevaluation of the patient after these medicines showed that the patient improved I have reviewed the patients home medicines and have made adjustments as needed   Social Determinants of Health:  Former cigarette use.  Denies illicit drug use.   Test / Admission - Considered:  Right lower abdominal pain, diarrhea Vitals signs within normal range and stable throughout visit. Laboratory/imaging studies significant for: See above 37 year old male presents emergency department with complaints  of right lower abdominal pain as well as diarrhea.  Diarrhea reportedly occurring for the past month with right lower quadrant abdominal pain occurring over the past  few days.  Patient with reproducible tenderness in right lower quadrant.  Laboratory studies overall reassuring without evidence of significant electrolyte derangement, leukocytosis.  Pending CT imaging at this time for assessment of patient's right lower abdominal pain.  Regarding suprapubic catheter, catheter was replaced in manner as depicted above with appreciable urine outflow after initial catheter was removed.  Subsequently, drainage of slightly cloudy yellow urine was appreciated.  Suspect patient had sediment in old Foley catheter which could have been causing discomfort but still with some reproducible right lower quadrant tenderness so will still pursue CT imaging of patient's abdomen/pelvis as well as assess for potential urinary tract infection.  At time of shift change, patient care off to [facility]  Patient stable upon shift change.        Final Clinical Impression(s) / ED Diagnoses Final diagnoses:  None    Rx / DC Orders ED Discharge Orders     None         Peter Garter, [place] 12/03/22 Kristopher Oppenheim    Vanetta Mulders, MD 12/03/22 2330

## 2022-12-03 NOTE — ED Provider Notes (Signed)
Patient care signed out from Sherian Maroon, PA-C at end of shift. Here C/o suprapubic catheter discomfort, RLQ pain x 3 days, diarrhea for the past month. Nonbloody. No recent abx. PA Robbins replaced catheter without difficulty. UA pending. Reports previous urine cultures positive, sensitive to Macrobid. Pending CT  Physical Exam  BP (!) 114/90 (BP Location: Right Arm)   Pulse 84   Temp 98.2 F (36.8 C) (Oral)   Resp 18   Ht 5\' 2"  (1.575 m)   Wt 47.6 kg   SpO2 100%   BMI 19.19 kg/m     Procedures  Procedures  ED Course / MDM   Clinical Course as of 12/03/22 2255  Sun Dec 03, 2022  2049 CT reviewed by radiology. No appendix visualized but no evidence of inflammation to suggest acute appendicitis in the RLQ. Cholelithiasis. Bladder wall thickening - correlate with UA.   VSS. Will re-evaluate the patient with use of interpreter.   [SU]  2248 Interpreter used to update the patient on results of evaluation. He continues to report that he feels better than on arrival. UA c/w infection. Given bladder wall thickening on CT, positive urine, positive previous cultures, will treat with Macrobid. [SU]    Clinical Course User Index [SU] Elpidio Anis, PA-C   Medical Decision Making Amount and/or Complexity of Data Reviewed Labs: ordered. Radiology: ordered.  Risk Prescription drug management.   Dx:  1) diarrhea 2) abdominal pain 3) urinary tract infection      Elpidio Anis, PA-C 12/03/22 2256    Vanetta Mulders, MD 12/03/22 [phone]

## 2022-12-06 LAB — URINE CULTURE
Culture: >=100000 — AB
Special Requests: NORMAL

## 2022-12-07 ENCOUNTER — Telehealth (HOSPITAL_BASED_OUTPATIENT_CLINIC_OR_DEPARTMENT_OTHER): Payer: Self-pay | Admitting: *Deleted

## 2022-12-07 NOTE — Telephone Encounter (Signed)
Post ED Visit - Positive Culture Follow-up  Culture report reviewed by antimicrobial stewardship pharmacist: [facility] [x]  Atomwen Adesuwa, Pharm.D. []  Celedonio Miyamoto, Pharm.D., BCPS AQ-ID []  Garvin Fila, Pharm.D., BCPS []  Georgina Pillion, Pharm.D., BCPS []  [place], [address].D., BCPS, AAHIVP []  Estella Husk, Pharm.D., BCPS, AAHIVP []  Lysle Pearl, PharmD, BCPS []  Phillips Climes, PharmD, BCPS []  Agapito Games, PharmD, BCPS []  Verlan Friends, PharmD []  Mervyn Gay, PharmD, BCPS []  Vinnie Level, PharmD  [facility] []  Len Childs, PharmD []  Greer Pickerel, PharmD []  Adalberto Cole, PharmD []  Perlie Gold, Rph []  Lonell Face) Jean Rosenthal, PharmD []  Earl Many, PharmD []  Junita Push, PharmD []  Dorna Leitz, PharmD []  Terrilee Files, PharmD []  Lynann Beaver, PharmD []  Keturah Barre, PharmD []  Loralee Pacas, PharmD []  Bernadene Person, PharmD   Positive urine culture Treated with Nitrofurantoin, organism sensitive to the same and no further patient follow-up is required at this time.  Nena Polio Garner Nash 12/07/2022, 11:04 AM

## 2023-01-12 ENCOUNTER — Ambulatory Visit: Payer: MEDICAID | Admitting: Podiatry

## 2023-01-12 ENCOUNTER — Encounter: Payer: Self-pay | Admitting: Podiatry

## 2023-01-12 VITALS — Ht 62.0 in | Wt 104.0 lb

## 2023-01-12 DIAGNOSIS — M79674 Pain in right toe(s): Secondary | ICD-10-CM | POA: Diagnosis not present

## 2023-01-12 DIAGNOSIS — M79675 Pain in left toe(s): Secondary | ICD-10-CM | POA: Diagnosis not present

## 2023-01-12 DIAGNOSIS — E1142 Type 2 diabetes mellitus with diabetic polyneuropathy: Secondary | ICD-10-CM

## 2023-01-12 DIAGNOSIS — B351 Tinea unguium: Secondary | ICD-10-CM

## 2023-01-12 NOTE — Progress Notes (Signed)
  Subjective:  Patient ID: Garrett [name], male    DOB: Jan 01, 1986,  MRN: [id number]  37 y.o. male presents with the above complaint. History confirmed with patient. Patient presenting with pain related to dystrophic thickened elongated nails. Patient is unable to trim own nails related to nail dystrophy and/or mobility issues. Patient does  have a history of T2DM.   Objective:  Physical Exam: warm, good capillary refill nail exam onychomycosis of the toenails, onycholysis, and dystrophic nails, xerosis of the skin DP pulses palpable, PT pulses palpable, and protective sensation diminished Left Foot:  Pain with palpation of nails due to elongation and dystrophic growth.  Right Foot: Pain with palpation of nails due to elongation and dystrophic growth.   Assessment:   1. Pain due to onychomycosis of toenails of both feet   2. DM type 2 with diabetic peripheral neuropathy (HCC)     Plan:  Patient was evaluated and treated and all questions answered.  #Onychomycosis with pain  -Nails palliatively debrided as below. -Educated on self-care  Procedure: Nail Debridement Rationale: Pain Type of Debridement: manual, sharp debridement. Instrumentation: Nail nipper, rotary burr. Number of Nails: 10  Return in about 3 months (around 04/14/2023) for [facility].         Corinna Gab, DPM [facility]

## 2023-03-19 ENCOUNTER — Telehealth: Payer: Self-pay

## 2023-03-19 NOTE — Telephone Encounter (Signed)
[facility] called to get more information as to why patient was coming in as a catheter change the following day.   Patient's nurse stated," Patient is in bad condition and needs to be seen by a doctor. His catheter site is oozing with pus and I don't believe his catheter has been changed since he was seen last by your office in May. We just started treating him for infection and we are waiting on the culture"  Nurse made aware that patient had been scheduled for a catheter change and not to be seen by a provider. Nurse made aware that appointment for the catheter change will be cancelled and patient should be evaluated at the ER.   Nurse was talking with a Loistine Chance stating he drove the vans and that she will leave a note for the lady over transportation to contact the office in the morning concerning scheduling a follow up with a provider.  Call was ended.   Called facility back several times to get the name of the nurse with no answer. Message left on facility voicemail concerning patient and requesting a call back in the morning.

## 2023-03-20 ENCOUNTER — Other Ambulatory Visit: Payer: Self-pay

## 2023-03-20 ENCOUNTER — Emergency Department (HOSPITAL_COMMUNITY)
Admission: EM | Admit: 2023-03-20 | Discharge: 2023-03-20 | Disposition: A | Discharge status: Home / Self Care | Payer: Medicaid Other | Attending: Student | Admitting: Student

## 2023-03-20 ENCOUNTER — Encounter (HOSPITAL_COMMUNITY): Payer: Self-pay | Admitting: Emergency Medicine

## 2023-03-20 ENCOUNTER — Ambulatory Visit: Payer: Medicaid Other

## 2023-03-20 ENCOUNTER — Emergency Department (HOSPITAL_COMMUNITY): Payer: Medicaid Other

## 2023-03-20 DIAGNOSIS — T83028A Displacement of other indwelling urethral catheter, initial encounter: Secondary | ICD-10-CM | POA: Insufficient documentation

## 2023-03-20 DIAGNOSIS — T83010A Breakdown (mechanical) of cystostomy catheter, initial encounter: Secondary | ICD-10-CM

## 2023-03-20 DIAGNOSIS — E119 Type 2 diabetes mellitus without complications: Secondary | ICD-10-CM | POA: Insufficient documentation

## 2023-03-20 DIAGNOSIS — Z79899 Other long term (current) drug therapy: Secondary | ICD-10-CM | POA: Insufficient documentation

## 2023-03-20 DIAGNOSIS — Z794 Long term (current) use of insulin: Secondary | ICD-10-CM | POA: Diagnosis not present

## 2023-03-20 LAB — CBC WITH DIFFERENTIAL/PLATELET
Abs Immature Granulocytes: 0.01 10*3/uL (ref 0.00–0.07)
Basophils Absolute: 0 10*3/uL (ref 0.0–0.1)
Basophils Relative: 1 %
Eosinophils Absolute: 0.3 10*3/uL (ref 0.0–0.5)
Eosinophils Relative: 6 %
HCT: 32.2 % — ABNORMAL LOW (ref 39.0–52.0)
Hemoglobin: 10.4 g/dL — ABNORMAL LOW (ref 13.0–17.0)
Immature Granulocytes: 0 %
Lymphocytes Relative: 37 %
Lymphs Abs: 1.9 10*3/uL (ref 0.7–4.0)
MCH: 31 pg (ref 26.0–34.0)
MCHC: 32.3 g/dL (ref 30.0–36.0)
MCV: 96.1 fL (ref 80.0–100.0)
Monocytes Absolute: 0.4 10*3/uL (ref 0.1–1.0)
Monocytes Relative: 8 %
Neutro Abs: 2.4 10*3/uL (ref 1.7–7.7)
Neutrophils Relative %: 48 %
Platelets: 141 10*3/uL — ABNORMAL LOW (ref 150–400)
RBC: 3.35 MIL/uL — ABNORMAL LOW (ref 4.22–5.81)
RDW: 13.1 % (ref 11.5–15.5)
WBC: 5 10*3/uL (ref 4.0–10.5)
nRBC: 0 % (ref 0.0–0.2)

## 2023-03-20 LAB — URINALYSIS, ROUTINE W REFLEX MICROSCOPIC
Bilirubin Urine: NEGATIVE
Glucose, UA: NEGATIVE mg/dL
Ketones, ur: NEGATIVE mg/dL
Nitrite: NEGATIVE
Protein, ur: >=300 mg/dL — AB
RBC / HPF: >50 RBC/hpf (ref 0–5)
Specific Gravity, Urine: 1.02 (ref 1.005–1.030)
WBC, UA: >50 WBC/hpf (ref 0–5)
pH: 6 (ref 5.0–8.0)

## 2023-03-20 LAB — BASIC METABOLIC PANEL
Anion gap: 6 (ref 5–15)
BUN: 17 mg/dL (ref 6–20)
CO2: 27 mmol/L (ref 22–32)
Calcium: 8.6 mg/dL — ABNORMAL LOW (ref 8.9–10.3)
Chloride: 106 mmol/L (ref 98–111)
Creatinine, Ser: 0.66 mg/dL (ref 0.61–1.24)
GFR, Estimated: >60 mL/min (ref >60)
Glucose, Bld: 116 mg/dL — ABNORMAL HIGH (ref 70–99)
Potassium: 3.3 mmol/L — ABNORMAL LOW (ref 3.5–5.1)
Sodium: 139 mmol/L (ref 135–145)

## 2023-03-20 LAB — LACTIC ACID, PLASMA: Lactic Acid, Venous: 1.6 mmol/L (ref 0.5–1.9)

## 2023-03-20 MED ORDER — FOSFOMYCIN TROMETHAMINE 3 G PO PACK
3.0000 g | PACK | Freq: Once | ORAL | Status: AC
  Administered 2023-03-20: 3 g via ORAL
  Filled 2023-03-20: qty 3

## 2023-03-20 NOTE — Telephone Encounter (Signed)
Received call from pts nurse [place] stating. Patient is currently on po keflex and they are still awaiting pts urine culture. Nurse states that she sent patient to ER on 12/03/22 and his sp catheter was changed then. She states she believes that was the last time patients catheter had been changed. Nurse states the in house provider gave the order to send patient out to have catheter changed.

## 2023-03-20 NOTE — Telephone Encounter (Signed)
I called and spoke with Herbert Seta, Director at [facility] to discuss patient care-  Reviewed with Herbert Seta, passed missed two appointments. One in June and one in July. Per Director they were unaware of any scheduled appointments.  I also spoke with [place], patients nurse, who stated patient now has green pus draining around the catheter along with difficulty for urine to flow from the catheter tube. SP tube has not been changed for an unknown length of time per [place]. Per [place], the facility has not changed the SP tube at all.  After speaking with [place], advise [place] to have patient sent to ER for medical evaluation due to catheter no longer working along with green pus around site and length of time of SP tube change. [place] voiced understanding. An appointment was given to [place] for January - which will be 1 month after being seen by the ER.

## 2023-03-20 NOTE — Discharge Instructions (Addendum)
He has been given a one-time dose of fosfomycin here.  Suprapubic catheter has been replaced.  He will need follow-up with urology

## 2023-03-20 NOTE — ED Provider Notes (Signed)
   Patient signed out to me by Burgess Amor, PA-C pending completion of workup.  Patient here with suprapubic catheter, from [facility] nursing home brought in for evaluation of possible urosepsis.  Catheter was last changed in September patient missed several urology appointments.  He was started on cephalexin but apparently refusing to take his oral medications.  He was given IM injection of Rocephin this morning.    See previous provider note for complete H&P.    Catheter change was completed by previous provider.  Workup here this evening without evidence of sepsis.  Lactate reassuring no leukocytosis.  New catheter draining dark urine.  Patient is tolerated oral fluids.  Patient given fosfomycin here.  Urine culture pending . appears appropriate for discharge back to the facility    Labs Reviewed  CBC WITH DIFFERENTIAL/PLATELET - Abnormal; Notable for the following components:      Result Value   RBC 3.35 (*)    Hemoglobin 10.4 (*)    HCT 32.2 (*)    Platelets 141 (*)    All other components within normal limits  BASIC METABOLIC PANEL - Abnormal; Notable for the following components:   Potassium 3.3 (*)    Glucose, Bld 116 (*)    Calcium 8.6 (*)    All other components within normal limits  URINALYSIS, ROUTINE W REFLEX MICROSCOPIC - Abnormal; Notable for the following components:   Color, Urine AMBER (*)    APPearance CLOUDY (*)    Hgb urine dipstick LARGE (*)    Protein, ur >=300 (*)    Leukocytes,Ua LARGE (*)    Bacteria, UA FEW (*)    All other components within normal limits  CULTURE, BLOOD (ROUTINE X 2)  CULTURE, BLOOD (ROUTINE X 2)  URINE CULTURE  LACTIC ACID, PLASMA          Pauline Aus, PA-C 03/20/23 2305    Glendora Score, MD 03/21/23 1513

## 2023-03-20 NOTE — ED Triage Notes (Signed)
Per nursing facility pt missed his urology appt to have suprapubic catheter changed and now has green drainage from the site. Pt started keflex on Monday but refused to take meds by mouth. [facility] wants the catheter changed and sepsis protocol. Facility gave pt 1gm of rocephin this am.

## 2023-03-20 NOTE — ED Provider Notes (Signed)
[name] EMERGENCY DEPARTMENT AT [facility] Provider Note   CSN: [id number] Arrival date & time: 03/20/23  1644     History  Chief Complaint  Patient presents with   Catheter Problem    Garrett [name] is a 37 y.o. 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  His last catheter changed occurred here on 12/03/22 but has missed appts with urology in October and Nov as nursing staff at the home were unaware of these appts.  He was started on keflex ytd but patient has been refusing this medicine.  He was given a dose of rocephin this am. He presents here at urology advice for catheter change, UA and "sepsis" workup.   The history is provided by the patient and medical records.       Home Medications Prior to Admission medications   Medication Sig Start Date End Date Taking? Authorizing Provider  Accu-Chek Softclix Lancets lancets Use to test blood sugars up to 4 times daily as directed. 07/11/21   Russella Dar, NP  acetaminophen (TYLENOL) 325 MG tablet Take 2 tablets (650 mg total) by mouth every 6 (six) hours as needed for mild pain or moderate pain. 07/11/21   Russella Dar, NP  ANTI-DIARRHEAL 2 MG tablet Take 2 mg by mouth as needed for diarrhea or loose stools. 02/22/22   [provider]  benztropine (COGENTIN) 0.5 MG tablet Take 1 tablet (0.5 mg total) by mouth 2 (two) times daily. Patient taking differently: Take 0.5 mg by mouth at bedtime. 07/11/21   Russella Dar, NP  Blood Glucose Monitoring Suppl (BLOOD GLUCOSE MONITOR SYSTEM) w/Device KIT Use to test blood sugars up to 4 times daily as directed. 07/11/21   Russella Dar, NP  escitalopram (LEXAPRO) 10 MG tablet Take 1 tablet (10 mg total) by mouth daily. 07/12/21   Russella Dar, NP  feeding  supplement, GLUCERNA SHAKE, (GLUCERNA SHAKE) LIQD Take 237 mLs by mouth 3 (three) times daily between meals. 07/11/21   Russella Dar, NP  gabapentin (NEURONTIN) 100 MG capsule Take 1 capsule (100 mg total) by mouth 2 (two) times daily. 07/11/21   Russella Dar, NP  glucose blood (ACCU-CHEK GUIDE) test strip Use to check blood sugars up to 4 times daily as directed. 07/11/21   Russella Dar, NP  insulin aspart (NOVOLOG) 100 UNIT/ML FlexPen Inject 0-6 Units into the skin 3 (three) times daily with meals. 07/11/21   Russella Dar, NP  insulin glargine (LANTUS) 100 UNIT/ML Solostar Pen Inject 10 Units into the skin daily. 03/10/22   Vassie Loll, MD  Insulin Pen Needle 32G X 4 MM MISC Use to inject insulin up to 4 times daily. 07/11/21   Russella Dar, NP  LORazepam (ATIVAN) 0.5 MG tablet Take 1 tablet (0.5 mg total) by mouth at bedtime. 07/11/21   Russella Dar, NP  miconazole nitrate (MICATIN) POWD Apply 1 application. topically 3 (three) times daily. 07/11/21   Russella Dar, NP  midodrine (PROAMATINE) 10 MG tablet Take 1 tablet (10 mg total) by mouth 3 (three) times daily with meals. 03/10/22   Vassie Loll, MD  nitrofurantoin, macrocrystal-monohydrate, (MACROBID) 100 MG capsule Take 1 capsule (100 mg total) by mouth 2 (two) times daily. 12/03/22   Elpidio Anis, PA-C  ondansetron (ZOFRAN-ODT) 4 MG  disintegrating tablet Take 1 tablet (4 mg total) by mouth every 8 (eight) hours as needed for up to 15 doses for nausea or vomiting. 02/27/22   Terald Sleeper, MD  promethazine (PHENERGAN) 25 MG suppository Place 1 suppository (25 mg total) rectally every 8 (eight) hours as needed for refractory nausea / vomiting. 03/10/22   Vassie Loll, MD  tamsulosin (FLOMAX) 0.4 MG CAPS capsule Take 1 capsule (0.4 mg total) by mouth daily. 02/09/22   Bjorn Pippin, MD      Allergies    Patient has no known allergies.    Review of Systems   Review of Systems  Constitutional:  Negative for chills and  fever.  Eyes: Negative.   Respiratory:  Negative for chest tightness and shortness of breath.   Cardiovascular:  Negative for chest pain.  Gastrointestinal:  Positive for abdominal pain. Negative for nausea.  Genitourinary: Negative.   Skin: Negative.  Negative for rash.  Neurological:  Negative for dizziness, weakness, light-headedness, numbness and headaches.  Psychiatric/Behavioral: Negative.      Physical Exam Updated Vital Signs BP (!) 111/92   Pulse 82   Temp (!) 97.5 F (36.4 C)   Resp 17   Ht 5\' 2"  (1.575 m)   Wt 47 kg   SpO2 98%   BMI 18.95 kg/m  Physical Exam Vitals and nursing note reviewed.  Constitutional:      Appearance: He is well-developed. He is cachectic.  HENT:     Head: Normocephalic and atraumatic.  Eyes:     Conjunctiva/sclera: Conjunctivae normal.  Cardiovascular:     Rate and Rhythm: Normal rate and regular rhythm.     Heart sounds: Normal heart sounds.  Pulmonary:     Effort: Pulmonary effort is normal.     Breath sounds: Normal breath sounds. No wheezing.  Abdominal:     General: Bowel sounds are normal.     Palpations: Abdomen is soft.     Tenderness: There is no abdominal tenderness. There is no guarding.     Comments: Granulated suprapubic stoma,  no surrounding cutaneous erythema.  Dark,  opaque urine in bag and foley tubing.   Musculoskeletal:        General: Normal range of motion.     Cervical back: Normal range of motion.  Skin:    General: Skin is warm and dry.  Neurological:     Mental Status: He is alert.     ED Results / Procedures / Treatments   Labs (all labs ordered are listed, but only abnormal results are displayed) Labs Reviewed  CBC WITH DIFFERENTIAL/PLATELET - Abnormal; Notable for the following components:      Result Value   RBC 3.35 (*)    Hemoglobin 10.4 (*)    HCT 32.2 (*)    Platelets 141 (*)    All other components within normal limits  BASIC METABOLIC PANEL - Abnormal; Notable for the following  components:   Potassium 3.3 (*)    Glucose, Bld 116 (*)    Calcium 8.6 (*)    All other components within normal limits  CULTURE, BLOOD (ROUTINE X 2)  CULTURE, BLOOD (ROUTINE X 2)  LACTIC ACID, PLASMA  LACTIC ACID, PLASMA  URINALYSIS, ROUTINE W REFLEX MICROSCOPIC    EKG None  Radiology DG Abdomen 1 View Result Date: 03/20/2023 CLINICAL DATA:  Green drainage from suprapubic catheter site. [facility] once catheter change and sepsis protocol. Constipation. EXAM: ABDOMEN - 1 VIEW COMPARISON:  None Available. FINDINGS: Gas-filled loops  of small bowel and colon. Loops of small bowel are at the upper limits of normal in caliber in the central abdomen. Findings are favored to represent ileus though obstruction is not excluded. No radio-opaque calculi or other significant radiographic abnormality are seen. IMPRESSION: Probable ileus. Obstruction is not excluded. Electronically Signed   By: Minerva Fester M.D.   On: 03/20/2023 19:44    Procedures SUPRAPUBIC TUBE PLACEMENT  Date/Time: 03/20/2023 7:43 PM  Performed by: Burgess Amor, PA-C Authorized by: Burgess Amor, PA-C   Consent:    Consent obtained:  Verbal   Consent given by:  Patient   Risks, benefits, and alternatives were discussed: yes     Risks discussed:  Bleeding, infection and pain   Alternatives discussed:  Delayed treatment Universal protocol:    Patient identity confirmed:  Verbally with patient and arm band Sedation:    Sedation type:  None Anesthesia:    Anesthesia method:  None Procedure details:    Complexity:  Simple   Catheter type:  Foley   Catheter size:  16 Fr   Ultrasound guidance: no     Number of attempts:  1   Urine characteristics:  Dark and cloudy Post-procedure details:    Procedure completion:  Tolerated well, no immediate complications     Medications Ordered in ED Medications - No data to display  ED Course/ Medical Decision Making/ A&P                                 Medical  Decision Making Pt presenting with probable uti and need for suprapubic catheter change as last change occurred 12/03/22.  Replaced here without complication.  Pending labs including UA.  He has no vital signs suggesting sepsis, blood cultures and lactic acid has been collected however.  Patient was given a a dose of Rocephin prior to arrival.  Patient at this time appears to be stable for discharge back to the nursing home assuming his labs are stable.  He will need antibiotics I suspect based on his urine appearance, pending UA at this time.  Discussed with patient with Pauline Aus, PA-C who assumes care.  Amount and/or Complexity of Data Reviewed Labs: ordered. Radiology: ordered.           Final Clinical Impression(s) / ED Diagnoses Final diagnoses:  None    Rx / DC Orders ED Discharge Orders     None         Victoriano Lain 03/20/23 1947    Glendora Score, MD 03/21/23 1512

## 2023-03-22 LAB — URINE CULTURE

## 2023-03-25 LAB — CULTURE, BLOOD (ROUTINE X 2)
Culture: NO GROWTH
Culture: NO GROWTH
Special Requests: ADEQUATE
Special Requests: ADEQUATE

## 2023-04-13 ENCOUNTER — Ambulatory Visit: Payer: MEDICAID | Admitting: Podiatry

## 2023-04-19 ENCOUNTER — Ambulatory Visit: Payer: Medicaid Other

## 2023-04-19 DIAGNOSIS — R339 Retention of urine, unspecified: Secondary | ICD-10-CM

## 2023-04-19 NOTE — Progress Notes (Signed)
Suprapubic Cath Change  Patient is present today for a suprapubic catheter change due to urinary retention.  10ml of water was drained from the balloon, a 16FR coude foley cath was removed from the tract with out difficulty.  Site was cleaned and prepped in a sterile fashion with betadine.  A 16FR foley cath was replaced into the tract no complications were noted. Urine return was noted, 10 ml of sterile water was inflated into the balloon and a leg bag was attached for drainage.  Patient tolerated well.  Performed by: Jonathon Castelo LPN  Follow up: No follow-ups on file.  Keep scheduled NV

## 2023-04-26 ENCOUNTER — Ambulatory Visit: Payer: MEDICAID | Admitting: Podiatry

## 2023-05-18 ENCOUNTER — Ambulatory Visit: Payer: Medicaid Other

## 2023-05-18 DIAGNOSIS — R339 Retention of urine, unspecified: Secondary | ICD-10-CM | POA: Diagnosis not present

## 2023-05-18 NOTE — Progress Notes (Signed)
Suprapubic Cath Change  Patient is present today for a suprapubic catheter change due to urinary retention.  10ml of water was drained from the balloon, a 16FR foley cath was removed from the tract with out difficulty.  Suprapubic catheter site was cleaned and prepped in a sterile fashion with Betadinex3  A 16FR foley cath was replaced into the tract no complications were noted. Urine return was noted, urine Clear yellow in color . 10 ml of sterile water was inflated into the balloon and a bed bag was attached for drainage.  Patient tolerated well.  Performed by: Alfonse Spruce. CMA  Follow up: 4 weeks

## 2023-06-15 ENCOUNTER — Ambulatory Visit: Payer: Medicaid Other

## 2023-06-15 DIAGNOSIS — R339 Retention of urine, unspecified: Secondary | ICD-10-CM

## 2023-06-15 MED ORDER — CIPROFLOXACIN HCL 500 MG PO TABS
500.0000 mg | ORAL_TABLET | Freq: Once | ORAL | Status: AC
  Administered 2023-06-15: 500 mg via ORAL

## 2023-06-15 NOTE — Progress Notes (Signed)
 Suprapubic Cath Change  Patient is present today for a suprapubic catheter change due to urinary retention.  10ml of water was drained from the balloon, a 16FR foley cath was removed from the tract with out difficulty.  Suprapubic catheter site was cleaned and prepped in a sterile fashion with Betadinex3  A 16 FR foley cath was replaced into the tract no complications were noted. Urine return was noted, urine Amber in color . 10 ml of sterile water was inflated into the balloon and a bed bag was attached for drainage.  Patient tolerated well. A night bag was given to patient and proper instruction was given on how to switch bags.    Performed by: Alfonse Spruce.  Follow up: 4 weeks

## 2023-07-17 ENCOUNTER — Ambulatory Visit (INDEPENDENT_AMBULATORY_CARE_PROVIDER_SITE_OTHER)

## 2023-07-17 DIAGNOSIS — R339 Retention of urine, unspecified: Secondary | ICD-10-CM | POA: Diagnosis not present

## 2023-07-17 MED ORDER — CIPROFLOXACIN HCL 500 MG PO TABS: 500.0000 mg | ORAL_TABLET | Freq: Once | ORAL | Status: DC

## 2023-07-17 NOTE — Patient Instructions (Signed)

## 2023-07-17 NOTE — Progress Notes (Signed)
 Suprapubic Cath Change  Patient is present today for a suprapubic catheter change due to urinary retention.  10ml of water was drained from the balloon, a 16FR foley cath was removed from the tract with out difficulty.  Suprapubic catheter site was cleaned and prepped in a sterile fashion with Betadinex3  A 16FR foley cath was replaced into the tract no complications were noted. Urine return was noted, urine Clear yellow in color . 10 ml of sterile water was inflated into the balloon and a bedside bag was attached for drainage.  Patient tolerated well.   Performed by: Guilherme Schwenke LPN  Follow up: 4 weeks

## 2023-08-17 ENCOUNTER — Ambulatory Visit

## 2023-08-17 DIAGNOSIS — R339 Retention of urine, unspecified: Secondary | ICD-10-CM | POA: Diagnosis not present

## 2023-08-17 NOTE — Progress Notes (Signed)
 Suprapubic Cath Change  Patient is present today for a suprapubic catheter change due to urinary retention.  15 ml of water was drained from the balloon, a 16 FR foley cath was removed from the tract with out difficulty.  Suprapubic catheter site was cleaned and prepped in a sterile fashion with Betadinex3  A 16 FR foley cath was replaced into the tract no complications were noted. Flushed return was noted, urine Clear yellow in color . 10 ml of sterile water was inflated into the balloon and a night bag was attached for drainage.  Patient tolerated well. A night bag was given to patient and proper instruction was given on how to switch bags.    Performed by: Gorden Latino, CMA  Follow up: keep nurse visited

## 2023-09-18 ENCOUNTER — Ambulatory Visit

## 2023-10-31 ENCOUNTER — Ambulatory Visit

## 2023-10-31 DIAGNOSIS — R339 Retention of urine, unspecified: Secondary | ICD-10-CM

## 2023-10-31 MED ORDER — CIPROFLOXACIN HCL 500 MG PO TABS
500.0000 mg | ORAL_TABLET | Freq: Once | ORAL | Status: AC
  Administered 2023-10-31: 500 mg via ORAL

## 2023-10-31 NOTE — Progress Notes (Signed)
 Suprapubic Cath Change  Patient is present today for a suprapubic catheter change due to urinary retention.  36ml of water was drained from the balloon, a 16FR foley cath was removed from the tract with out difficulty.  Suprapubic catheter site was cleaned and prepped in a sterile fashion with Betadinex3  A 16FR foley cath was replaced into the tract no complications were noted. Urine return was noted after catheter was flushed with 40cc of sterile water, urine Pink in color . 10 ml of sterile water was inflated into the balloon and a bed bag was attached for drainage.  Patient tolerated well.   Performed by: Exie DASEN. CMA  Follow up: 4 weeks

## 2023-10-31 NOTE — Addendum Note (Signed)
 Addended by: SAMMIE EXIE HERO on: 10/31/2023 02:57 PM   Modules accepted: Level of Service

## 2023-11-30 ENCOUNTER — Ambulatory Visit

## 2023-11-30 DIAGNOSIS — R339 Retention of urine, unspecified: Secondary | ICD-10-CM | POA: Diagnosis not present

## 2023-11-30 NOTE — Progress Notes (Signed)
 Suprapubic Cath Change  Patient is present today for a suprapubic catheter change due to urinary retention.  30 ml of water was drained from the balloon, a 16 FR foley cath was removed from the tract with out difficulty.  Suprapubic catheter site was cleaned and prepped in a sterile fashion with Betadinex3  A 16 FR foley cath was replaced into the tract no complications were noted. 140 CC Flush return was noted, urine Clear yellow in color . 10 ml of sterile water was inflated into the balloon and a night bag was attached for drainage.  Patient tolerated well. A night bag was given to patient and proper instruction was given on how to switch bags.    Performed by: Carlos, CMA  Follow up: Keep monthly SP Tube Cath

## 2023-12-31 ENCOUNTER — Ambulatory Visit

## 2023-12-31 DIAGNOSIS — R339 Retention of urine, unspecified: Secondary | ICD-10-CM

## 2023-12-31 NOTE — Progress Notes (Signed)
 Suprapubic Cath Change  Patient is present today for a suprapubic catheter change due to urinary retention.  20 ml of water was drained from the balloon, a 16 FR foley cath was removed from the tract with out difficulty.  Suprapubic catheter site was cleaned and prepped in a sterile fashion with Betadinex3  A 16FR foley cath was replaced into the tract no complications were noted. Urine return was noted, urine Uric acid crystals in color . 10 ml of sterile water was inflated into the balloon and a night bag was attached for drainage.  Patient tolerated well. A night bag was given to patient and proper instruction was given on how to switch bags.    Performed by: Carlos, CMA  Follow up: Keep monthly SP tube change

## 2024-01-21 IMAGING — CT CT HEAD W/O CM
4 series · 16 of 47 positions shown, 18 images · non-contrast
Comparison: 03/28/2021

CLINICAL DATA: Recent fall



[Series 3: head wo · axial · 0.48mm/px · z∈[+1397,+1522]mm · 7 of 35 slices shown, 9 images]
[im 5/35  brain]
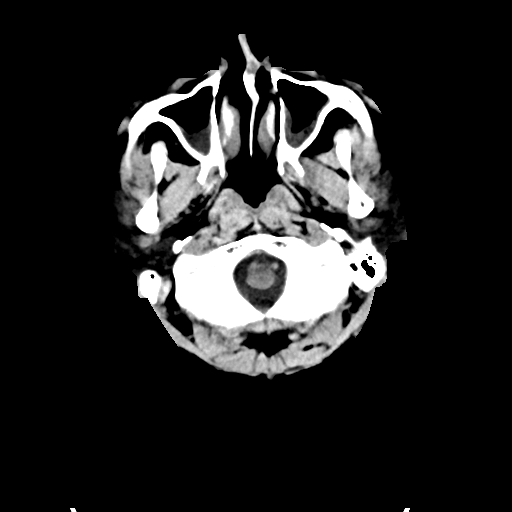
[im 5/35  bone]
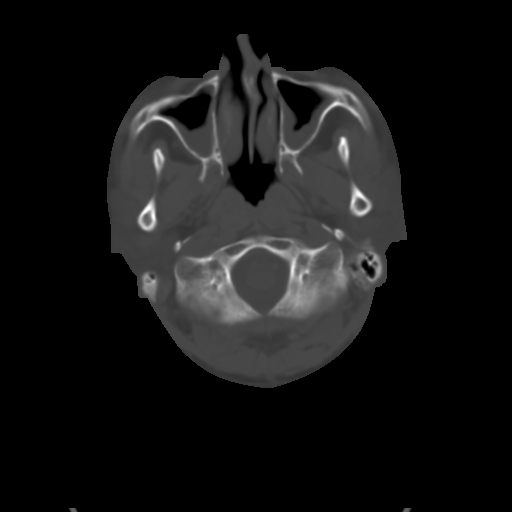
[im 9/35  brain]
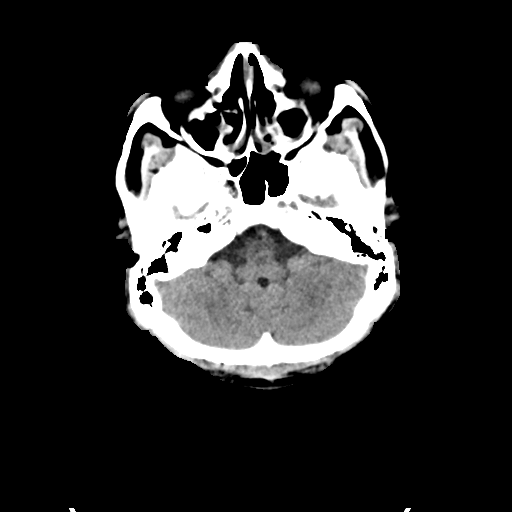
[im 13/35  brain]
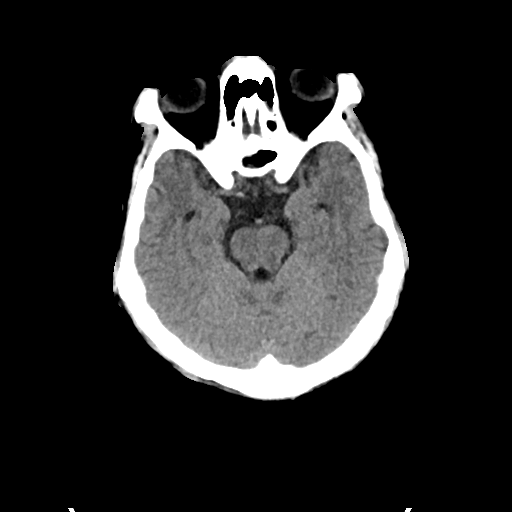
[im 18/35  brain]
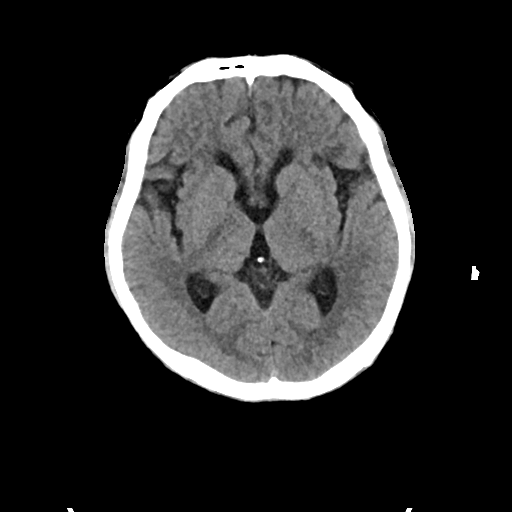
[im 22/35  brain]
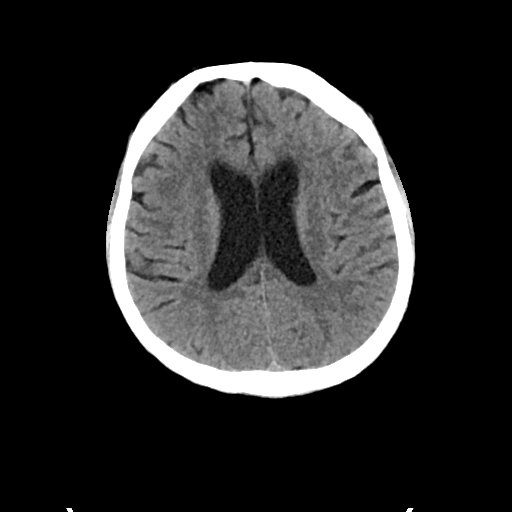
[im 22/35  bone]
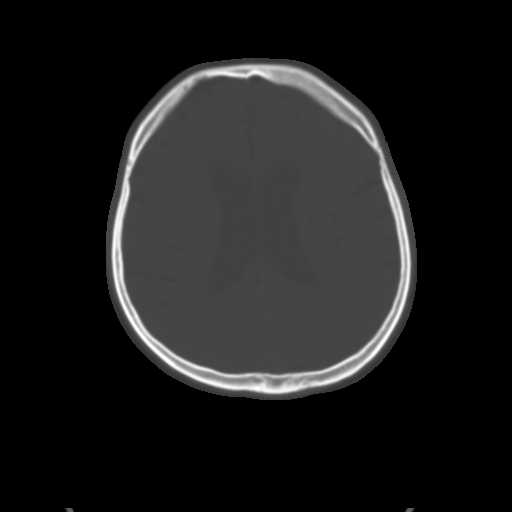
[im 26/35  brain]
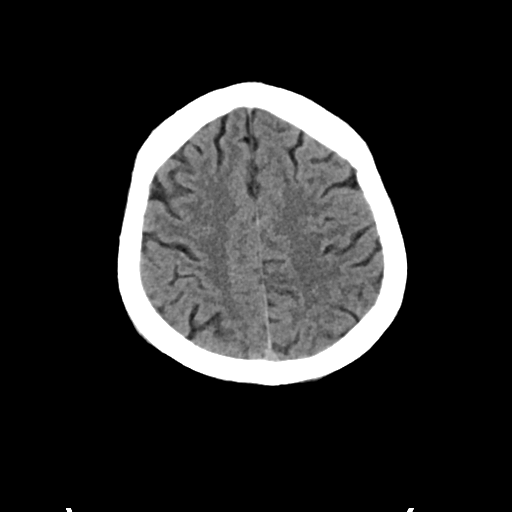
[im 30/35  brain]
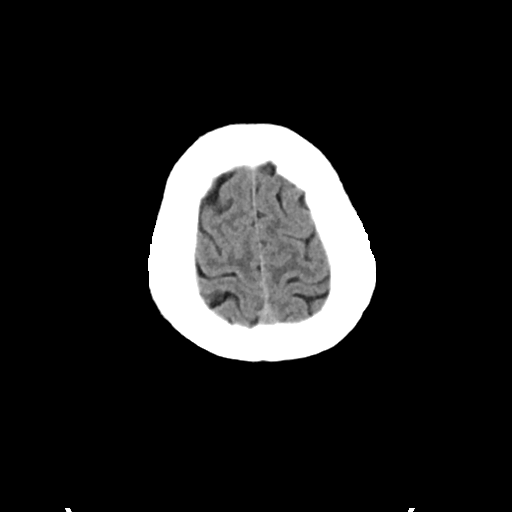

[Series 4: head bone · axial · 0.48mm/px · z∈[+1393,+1429]mm · 3 of 88 slices shown]
[im 9/88  bone]
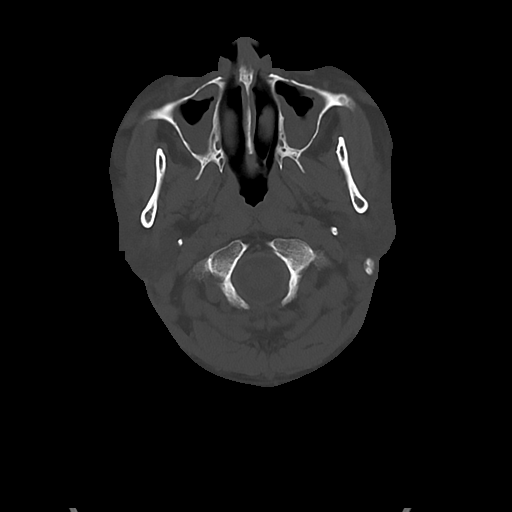
[im 18/88  bone]
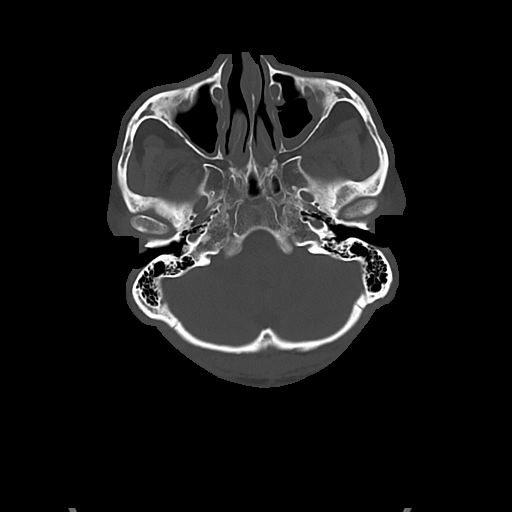
[im 27/88  bone]
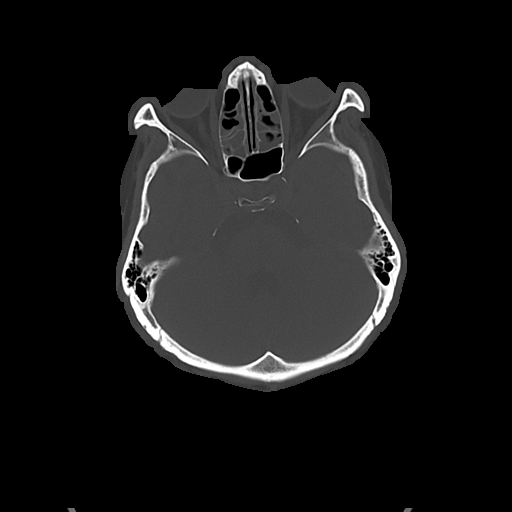

[Series 5: cor soft · coronal · 0.35mm/px · 3 of 68 slices shown]
[im 23/68  brain]
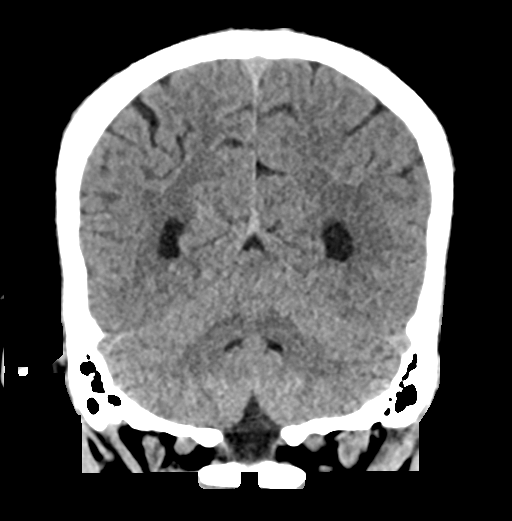
[im 30/68  brain]
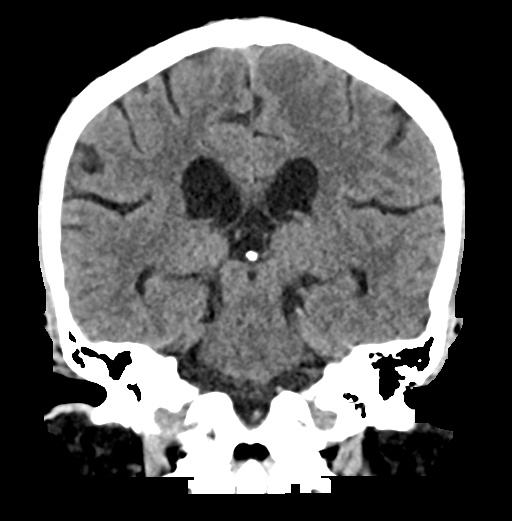
[im 38/68  brain]
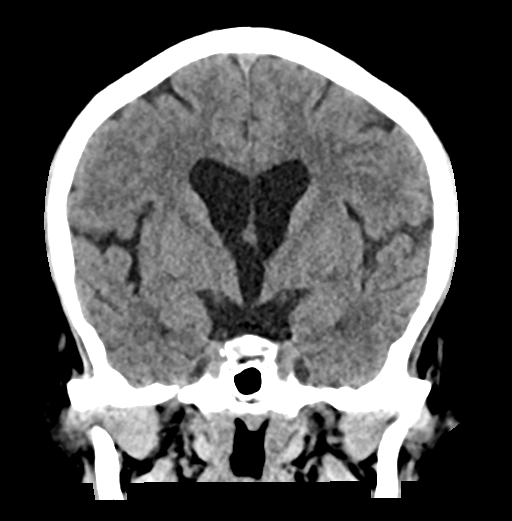

[Series 6: sag soft · sagittal · 0.36mm/px · 3 of 60 slices shown]
[im 20/60  brain]
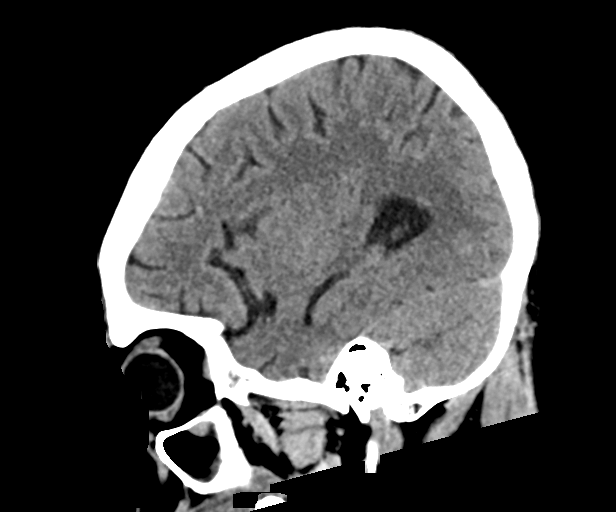
[im 30/60  brain]
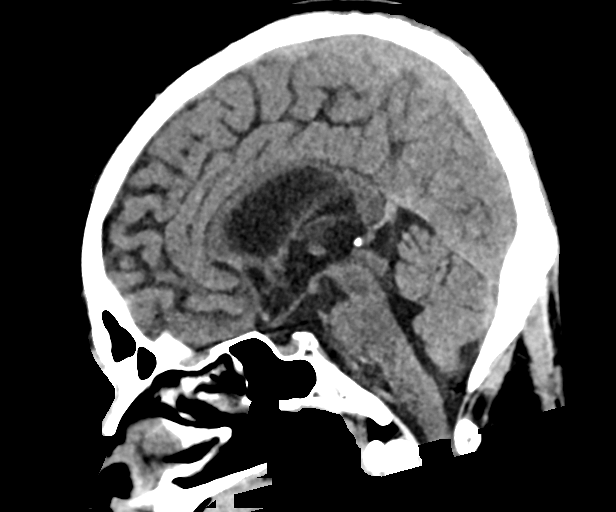
[im 40/60  brain]
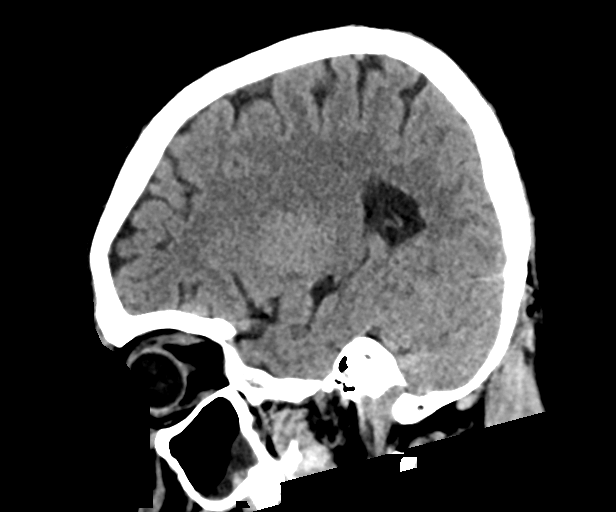

[16 of 47 positions shown; findings below may reference images not displayed]

FINDINGS: CT HEAD FINDINGS

Brain: No evidence of acute infarction, hemorrhage, hydrocephalus,
extra-axial collection or mass lesion/mass effect.

Vascular: No hyperdense vessel or unexpected calcification.

Skull: Normal. Negative for fracture or focal lesion.

Sinuses/Orbits: Mucosal thickening is noted within the ethmoid and
maxillary sinuses increased from the prior exam.

Other: None

CT CERVICAL SPINE FINDINGS

Alignment: Within normal limits.

Skull base and vertebrae: 7 cervical segments are well visualized.
Vertebral body height is well maintained. No acute fracture or acute
facet abnormality is noted. The odontoid is within normal limits.

Soft tissues and spinal canal: Surrounding soft tissue structures
are within normal limits.

Upper chest: Visualized lung apices are within normal limits.

Other: None
IMPRESSION: CT of the head: No acute intracranial abnormality is noted.

Persistent mucosal thickening in the paranasal sinuses.

CT of cervical spine: No acute abnormality noted.

## 2024-01-22 IMAGING — US US ABDOMEN LIMITED
1 series · 14 of 25 positions shown · non-contrast
Comparison: 05/05/2021

CLINICAL DATA: Elevated liver function tests

EXAM:
ULTRASOUND ABDOMEN LIMITED RIGHT UPPER QUADRANT

[Series 1: us abdomen limited ruq (liver/gb) · 14 of 41 slices shown]
[im 1/41]
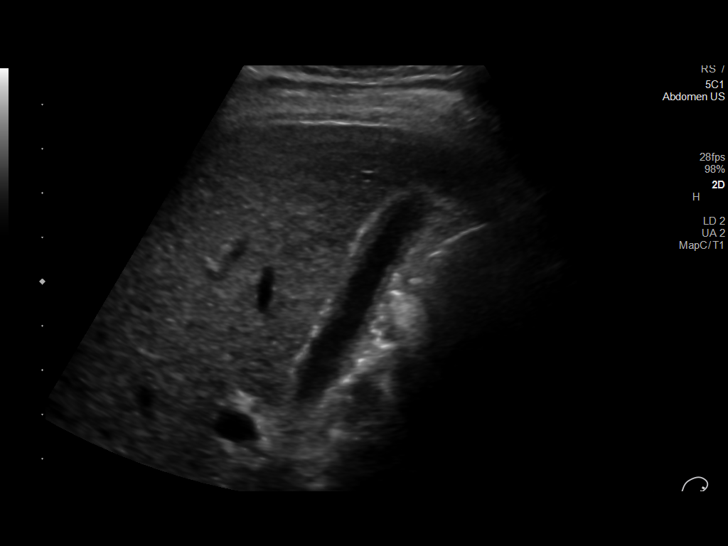
[im 4/41]
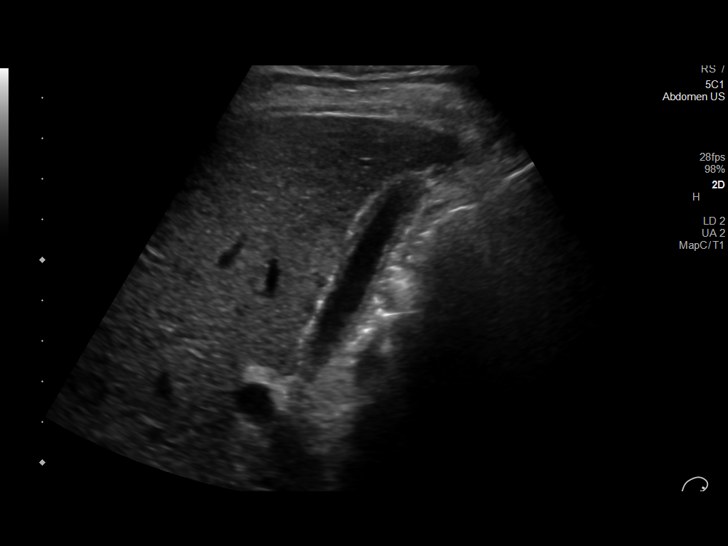
[im 7/41]
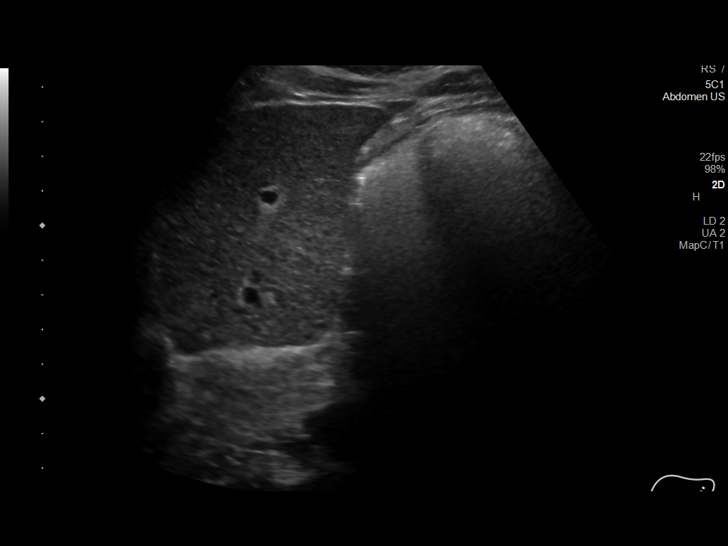
[im 11/41]
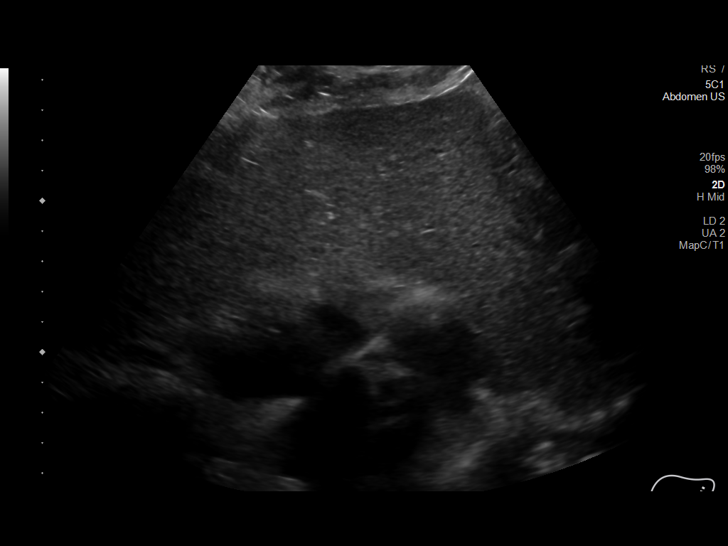
[im 14/41]
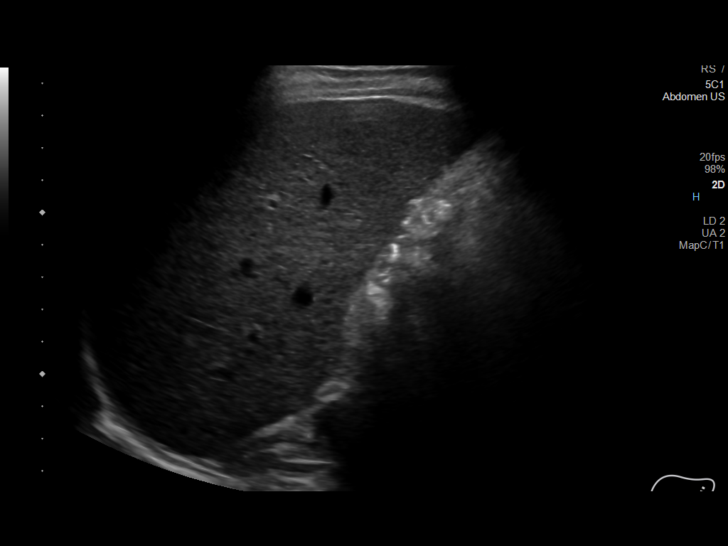
[im 16/41]
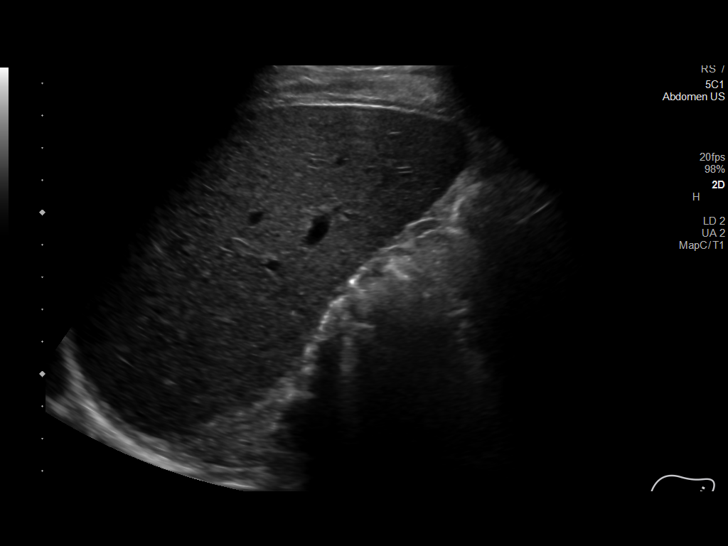
[im 19/41]
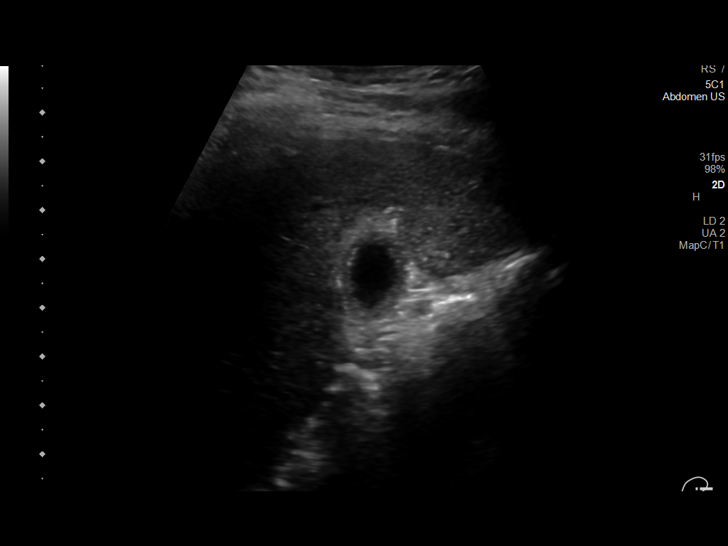
[im 22/41]
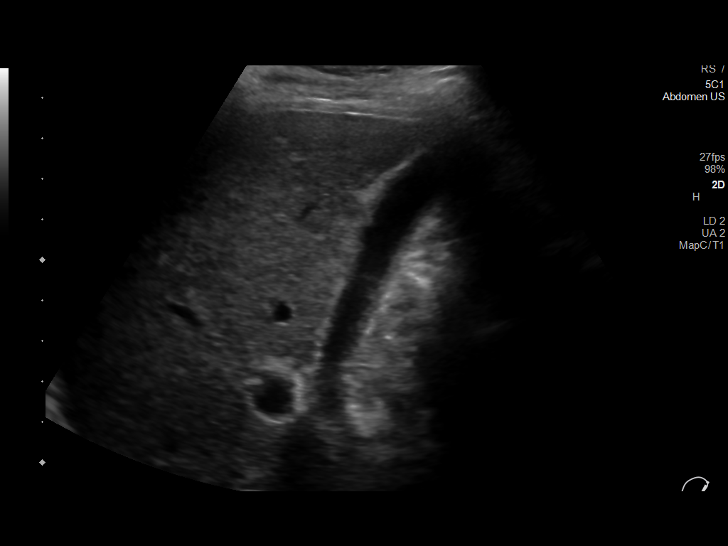
[im 26/41]
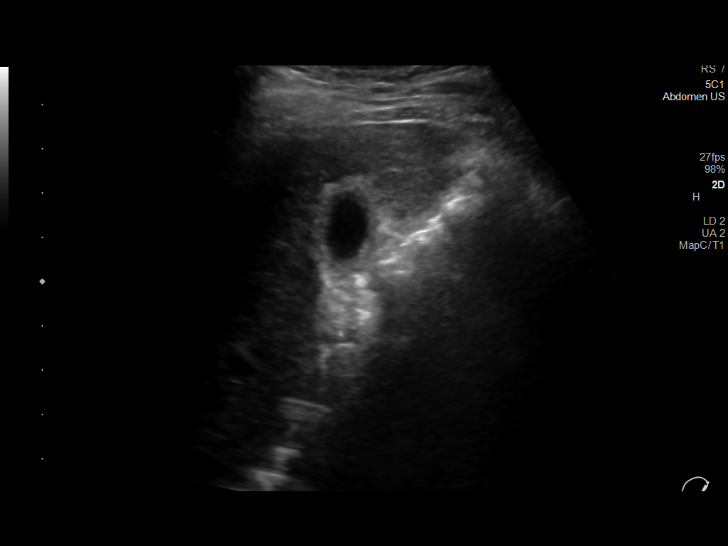
[im 27/41]
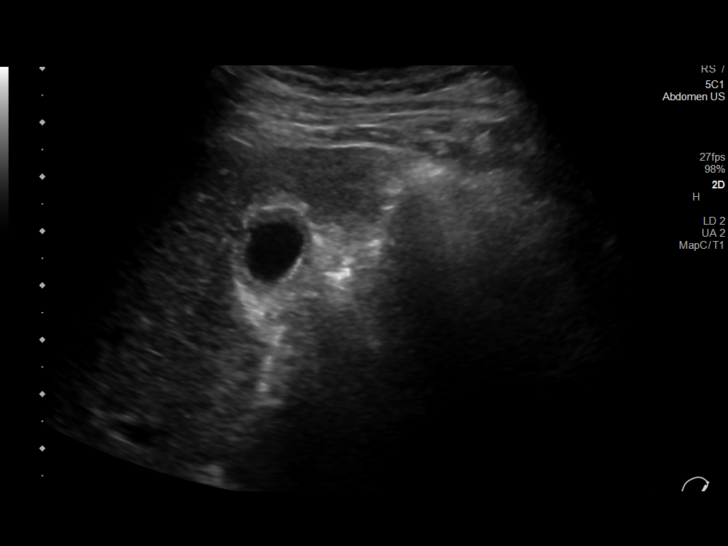
[im 31/41]
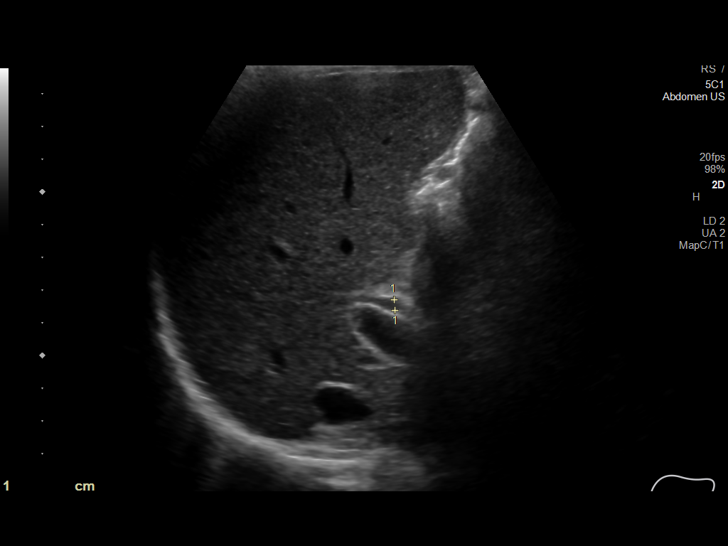
[im 34/41]
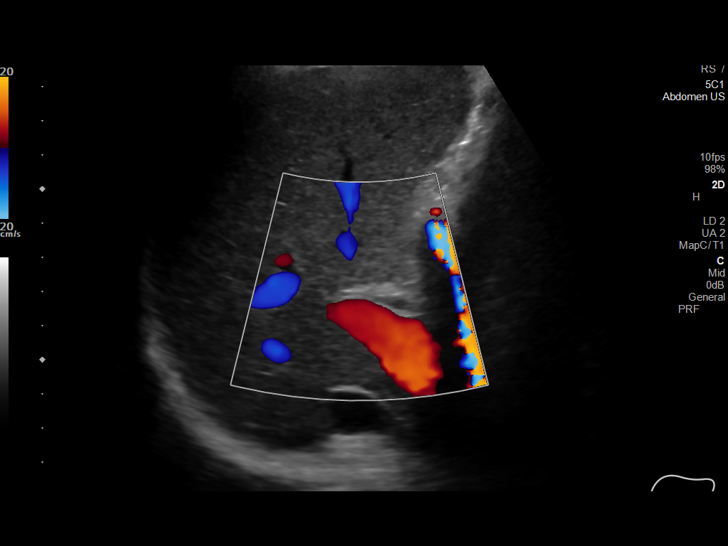
[im 37/41]
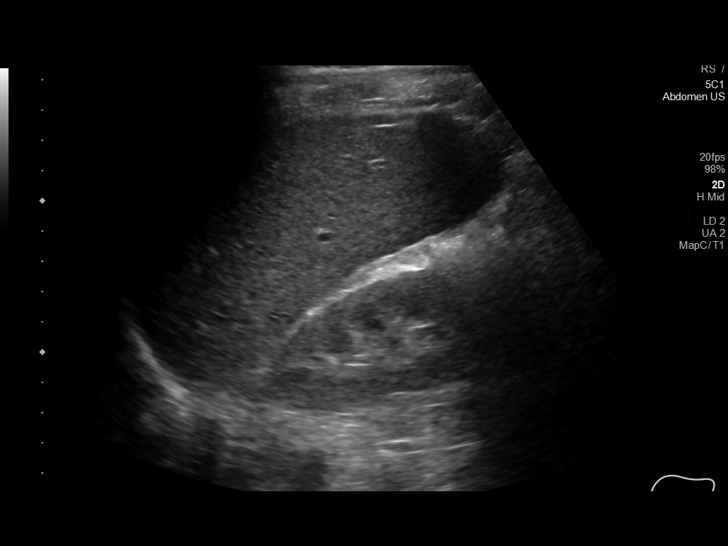
[im 41/41]
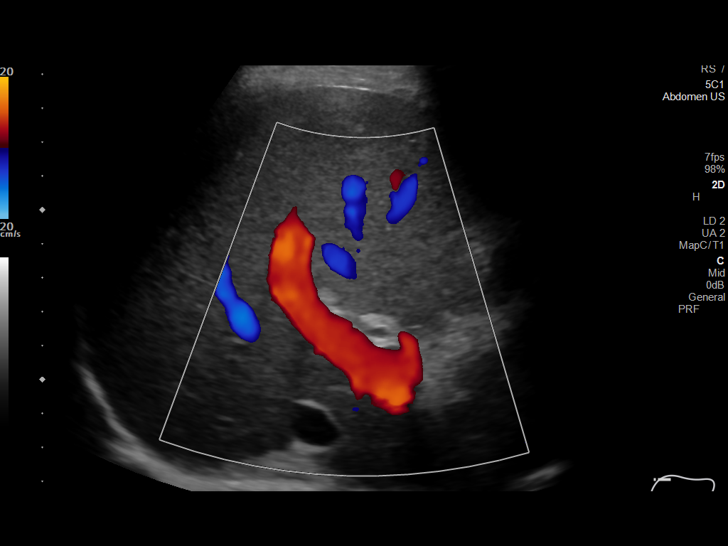

[14 of 25 positions shown; findings below may reference images not displayed]

FINDINGS: Gallbladder:

Gallbladder is decompressed. No evidence of cholelithiasis. No
sonographic Murphy sign noted by sonographer.

Common bile duct:

Diameter: 3 mm

Liver:

No focal lesion identified. Within normal limits in parenchymal
echogenicity. Portal vein is patent on color Doppler imaging with
normal direction of blood flow towards the liver.

Other: None.
IMPRESSION: 1. Unremarkable right upper quadrant ultrasound.

## 2024-01-22 IMAGING — CT CT ABD-PELV W/ CM
2 of 4 series · 15 of 46 positions shown, 17 images · IV contrast (Omni 300)
Comparison: 03/09/2020

CLINICAL DATA: Abdominal pain, elevated transaminases, trauma,
cholecystitis, other

EXAM:
CT ABDOMEN AND PELVIS WITH CONTRAST
TECHNIQUE: Multidetector CT imaging of the abdomen and pelvis was performed
using the standard protocol following bolus administration of
intravenous contrast.

[Series 3: a/p w/ 5mm · axial · 0.78mm/px · z∈[+805,+1205]mm · 12 of 96 slices shown, 14 images]
[im 8/96  soft-tissue]
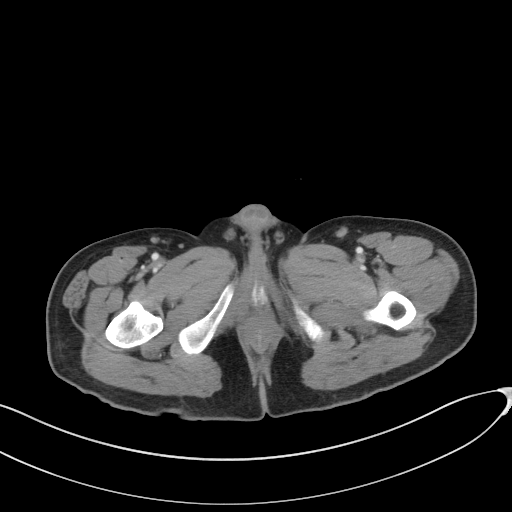
[im 8/96  bone]
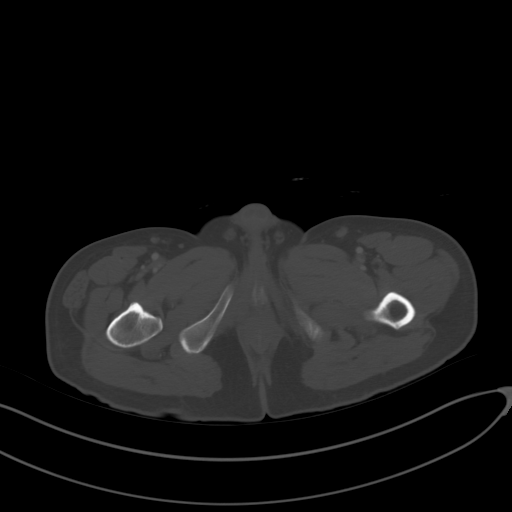
[im 15/96  soft-tissue]
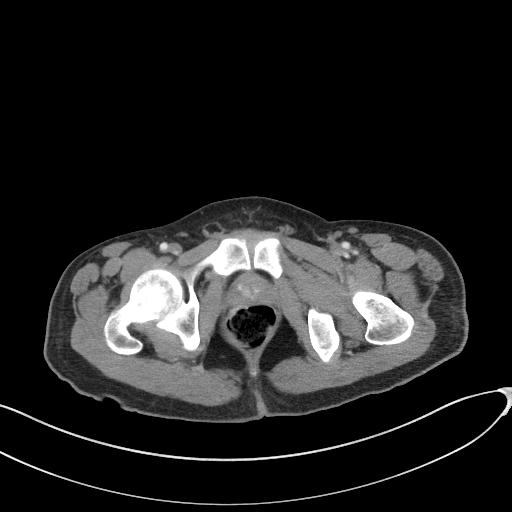
[im 22/96  soft-tissue]
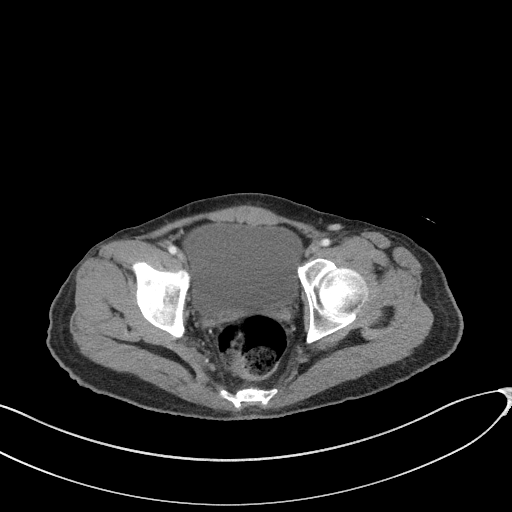
[im 30/96  soft-tissue]
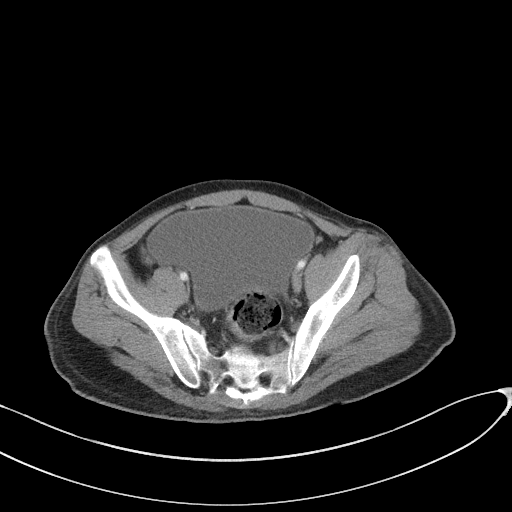
[im 37/96  soft-tissue]
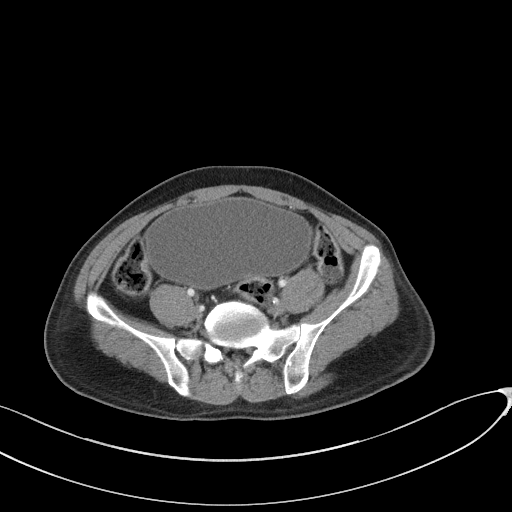
[im 44/96  soft-tissue]
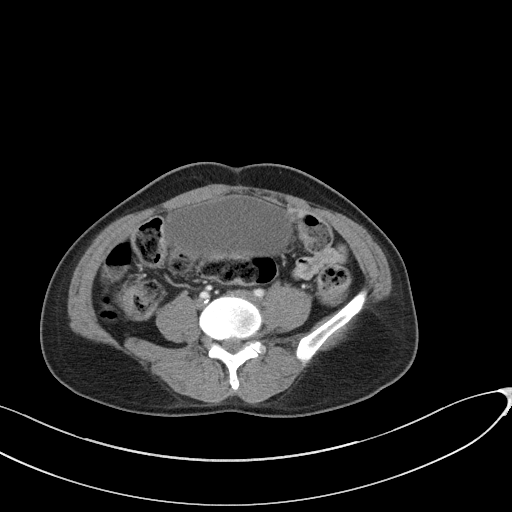
[im 52/96  soft-tissue]
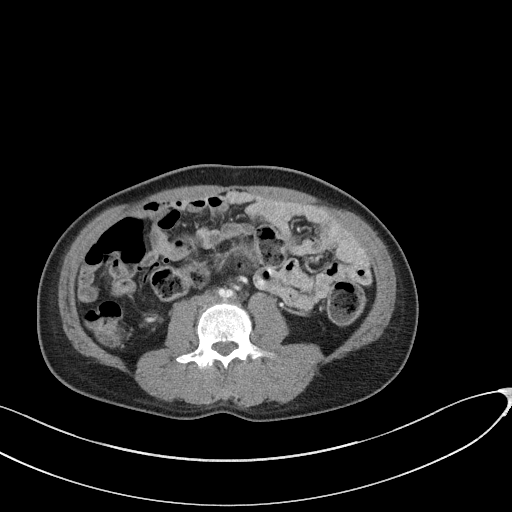
[im 59/96  soft-tissue]
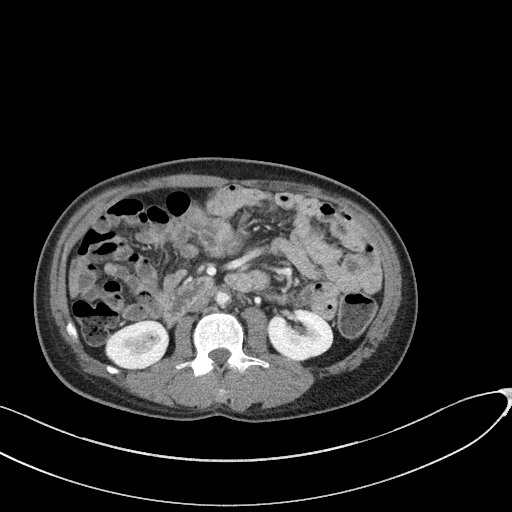
[im 66/96  soft-tissue]
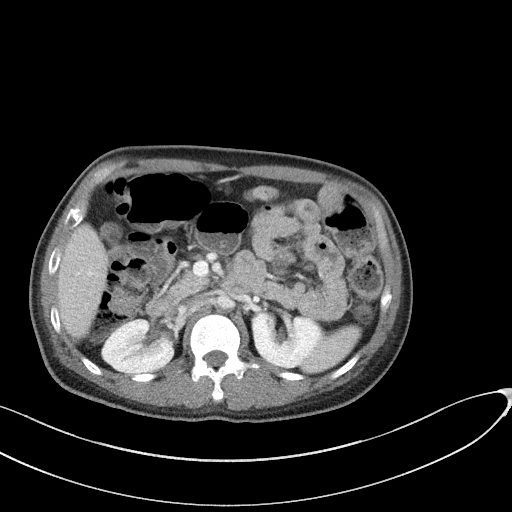
[im 66/96  bone]
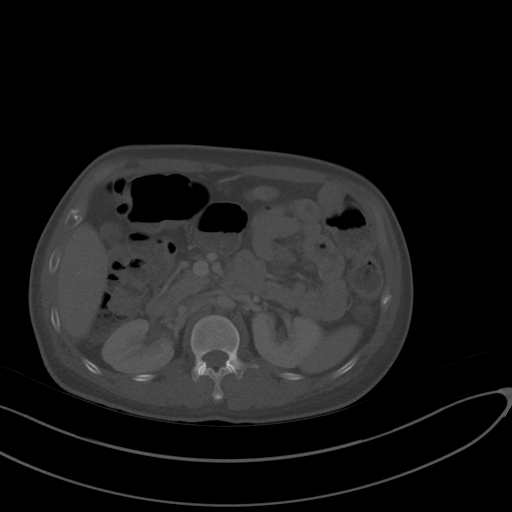
[im 74/96  soft-tissue]
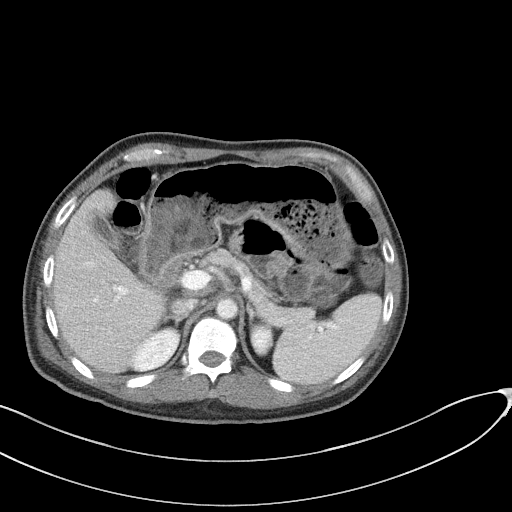
[im 81/96  soft-tissue]
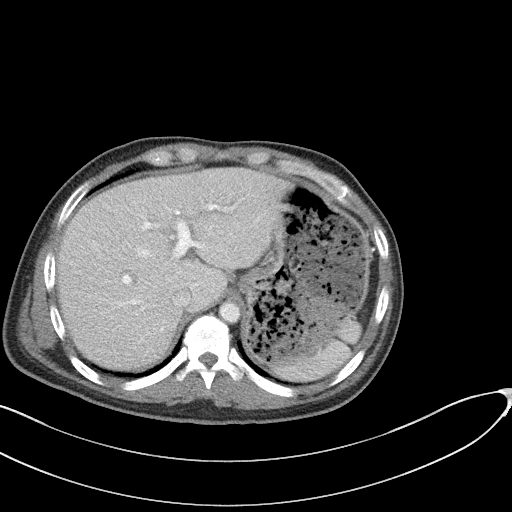
[im 88/96  soft-tissue]
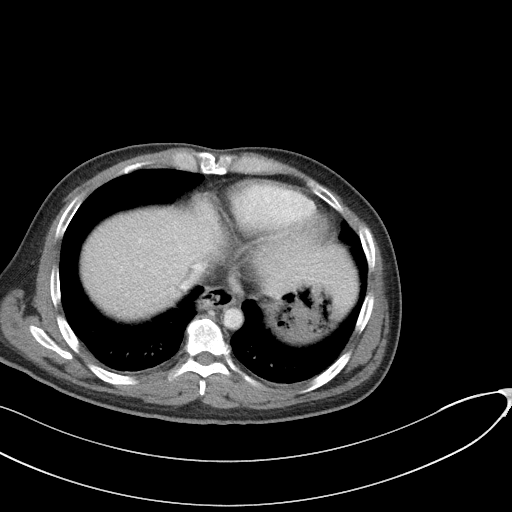

[Series 6: a/p w/ cor · coronal · 0.68mm/px · 3 of 113 slices shown]
[im 38/113  soft-tissue]
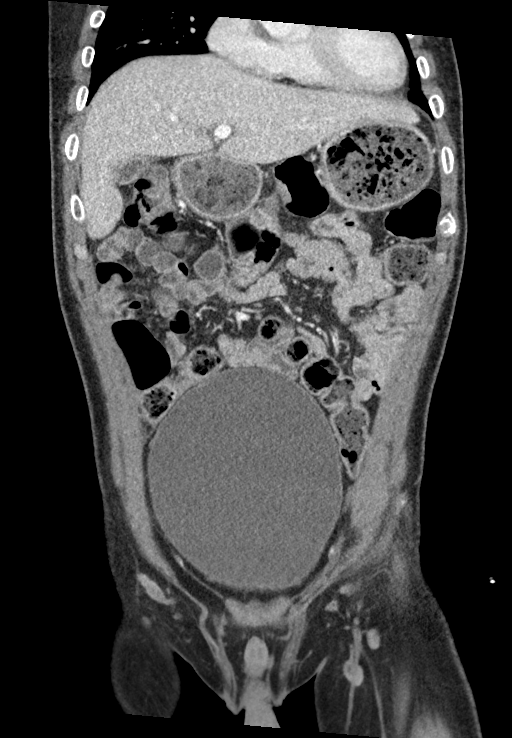
[im 50/113  soft-tissue]
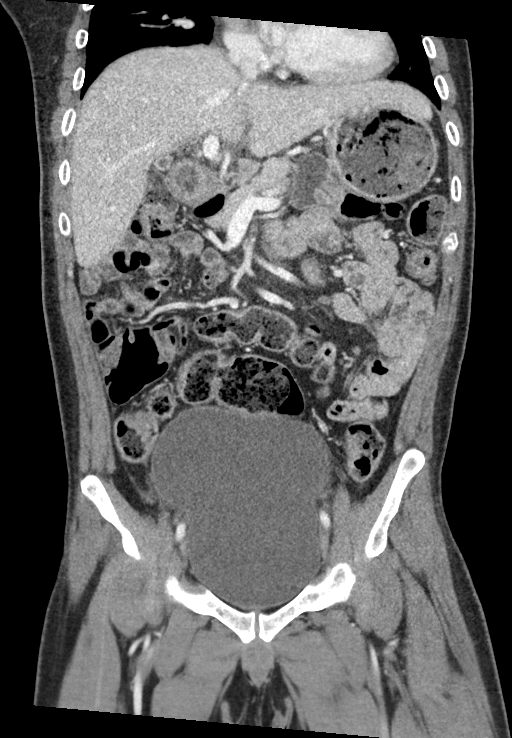
[im 63/113  soft-tissue]
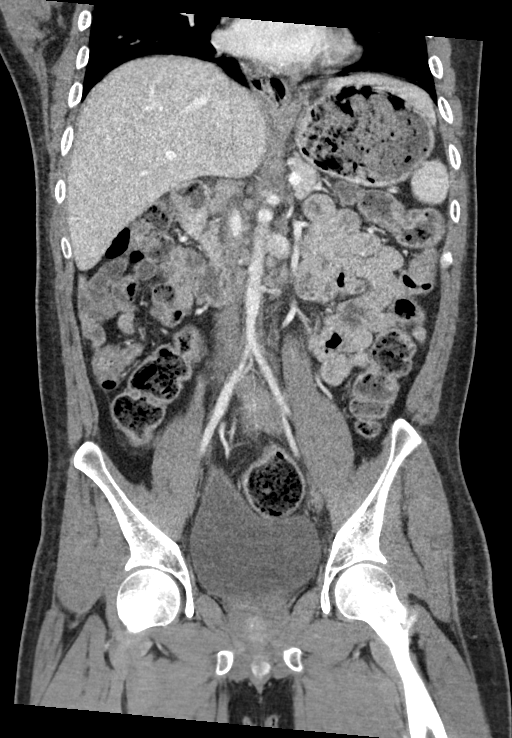

[15 of 46 positions shown; findings below may reference images not displayed]

RADIATION DOSE REDUCTION: This exam was performed according to the
departmental dose-optimization program which includes automated
exposure control, adjustment of the mA and/or kV according to
patient size and/or use of iterative reconstruction technique.

CONTRAST:  80mL OMNIPAQUE IOHEXOL 300 MG/ML  SOLN
FINDINGS: Lower chest: No acute abnormality.

Hepatobiliary: No solid liver abnormality is seen. Contracted
gallbladder. No gallstones, gallbladder wall thickening, or biliary
dilatation.

Pancreas: Unremarkable. No pancreatic ductal dilatation or
surrounding inflammatory changes.

Spleen: Normal in size without significant abnormality.

Adrenals/Urinary Tract: Adrenal glands are unremarkable. Kidneys are
normal, without renal calculi, solid lesion, or hydronephrosis.
Distended urinary bladder, bladder measuring at least 17 cm.

Stomach/Bowel: Stomach is within normal limits. Appendix appears
normal. No evidence of bowel wall thickening, distention, or
inflammatory changes. Large burden of stool throughout the colon and
rectum.

Vascular/Lymphatic: Extensive, predominately noncalcific aortic
atherosclerosis. No enlarged abdominal or pelvic lymph nodes.

Reproductive: No mass or other significant abnormality.

Other: No abdominal wall hernia or abnormality. No ascites.

Musculoskeletal: No acute or significant osseous findings.
IMPRESSION: 1. Distended urinary bladder, bladder measuring at least 17 cm.
Correlate for urinary retention.
2. Large burden of stool throughout the colon and rectum.
3. Extensive, predominately noncalcific aortic atherosclerosis,
significantly advanced for patient age.

Aortic Atherosclerosis ([id number]-NZO.O).

## 2024-01-30 ENCOUNTER — Ambulatory Visit

## 2024-03-04 ENCOUNTER — Ambulatory Visit

## 2024-04-07 ENCOUNTER — Ambulatory Visit

## 2024-04-11 ENCOUNTER — Ambulatory Visit

## 2024-04-14 ENCOUNTER — Encounter (HOSPITAL_COMMUNITY): Payer: Self-pay

## 2024-04-14 ENCOUNTER — Other Ambulatory Visit: Payer: Self-pay

## 2024-04-14 ENCOUNTER — Emergency Department (HOSPITAL_COMMUNITY)

## 2024-04-14 ENCOUNTER — Inpatient Hospital Stay (HOSPITAL_COMMUNITY)
Admission: EM | Admit: 2024-04-14 | Discharge: 2024-04-18 | DRG: 698 | Disposition: A | Discharge status: Skilled Nursing Facility | Source: Skilled Nursing Facility | Attending: Internal Medicine | Admitting: Internal Medicine

## 2024-04-14 DIAGNOSIS — E870 Hyperosmolality and hypernatremia: Secondary | ICD-10-CM | POA: Diagnosis present

## 2024-04-14 DIAGNOSIS — Z1152 Encounter for screening for COVID-19: Secondary | ICD-10-CM | POA: Diagnosis not present

## 2024-04-14 DIAGNOSIS — R54 Age-related physical debility: Secondary | ICD-10-CM | POA: Diagnosis present

## 2024-04-14 DIAGNOSIS — Z681 Body mass index (BMI) 19 or less, adult: Secondary | ICD-10-CM

## 2024-04-14 DIAGNOSIS — Z87891 Personal history of nicotine dependence: Secondary | ICD-10-CM | POA: Diagnosis not present

## 2024-04-14 DIAGNOSIS — Z79899 Other long term (current) drug therapy: Secondary | ICD-10-CM | POA: Diagnosis not present

## 2024-04-14 DIAGNOSIS — E876 Hypokalemia: Secondary | ICD-10-CM | POA: Diagnosis present

## 2024-04-14 DIAGNOSIS — A419 Sepsis, unspecified organism: Secondary | ICD-10-CM | POA: Diagnosis present

## 2024-04-14 DIAGNOSIS — Y846 Urinary catheterization as the cause of abnormal reaction of the patient, or of later complication, without mention of misadventure at the time of the procedure: Secondary | ICD-10-CM | POA: Diagnosis present

## 2024-04-14 DIAGNOSIS — E860 Dehydration: Secondary | ICD-10-CM | POA: Diagnosis present

## 2024-04-14 DIAGNOSIS — E1143 Type 2 diabetes mellitus with diabetic autonomic (poly)neuropathy: Secondary | ICD-10-CM | POA: Diagnosis present

## 2024-04-14 DIAGNOSIS — B952 Enterococcus as the cause of diseases classified elsewhere: Secondary | ICD-10-CM | POA: Diagnosis present

## 2024-04-14 DIAGNOSIS — R64 Cachexia: Secondary | ICD-10-CM | POA: Diagnosis present

## 2024-04-14 DIAGNOSIS — F32A Depression, unspecified: Secondary | ICD-10-CM | POA: Diagnosis present

## 2024-04-14 DIAGNOSIS — D649 Anemia, unspecified: Secondary | ICD-10-CM | POA: Diagnosis present

## 2024-04-14 DIAGNOSIS — N390 Urinary tract infection, site not specified: Secondary | ICD-10-CM | POA: Diagnosis present

## 2024-04-14 DIAGNOSIS — K8020 Calculus of gallbladder without cholecystitis without obstruction: Secondary | ICD-10-CM | POA: Diagnosis present

## 2024-04-14 DIAGNOSIS — A4159 Other Gram-negative sepsis: Secondary | ICD-10-CM | POA: Diagnosis present

## 2024-04-14 DIAGNOSIS — G9341 Metabolic encephalopathy: Secondary | ICD-10-CM | POA: Diagnosis present

## 2024-04-14 DIAGNOSIS — T83510A Infection and inflammatory reaction due to cystostomy catheter, initial encounter: Principal | ICD-10-CM | POA: Diagnosis present

## 2024-04-14 DIAGNOSIS — Z794 Long term (current) use of insulin: Secondary | ICD-10-CM | POA: Diagnosis not present

## 2024-04-14 DIAGNOSIS — E43 Unspecified severe protein-calorie malnutrition: Secondary | ICD-10-CM | POA: Diagnosis present

## 2024-04-14 DIAGNOSIS — B181 Chronic viral hepatitis B without delta-agent: Secondary | ICD-10-CM | POA: Diagnosis present

## 2024-04-14 DIAGNOSIS — R627 Adult failure to thrive: Secondary | ICD-10-CM | POA: Diagnosis present

## 2024-04-14 DIAGNOSIS — Z603 Acculturation difficulty: Secondary | ICD-10-CM | POA: Diagnosis present

## 2024-04-14 DIAGNOSIS — R6520 Severe sepsis without septic shock: Secondary | ICD-10-CM | POA: Diagnosis present

## 2024-04-14 DIAGNOSIS — I10 Essential (primary) hypertension: Secondary | ICD-10-CM | POA: Diagnosis present

## 2024-04-14 LAB — URINALYSIS, W/ REFLEX TO CULTURE (INFECTION SUSPECTED)
Bilirubin Urine: NEGATIVE
Glucose, UA: NEGATIVE mg/dL
Ketones, ur: 20 mg/dL — AB
Nitrite: POSITIVE — AB
Protein, ur: 100 mg/dL — AB
Specific Gravity, Urine: >1.046 — ABNORMAL HIGH (ref 1.005–1.030)
pH: 5 (ref 5.0–8.0)

## 2024-04-14 LAB — RESP PANEL BY RT-PCR (RSV, FLU A&B, COVID)  RVPGX2
Influenza A by PCR: NEGATIVE
Influenza B by PCR: NEGATIVE
Resp Syncytial Virus by PCR: NEGATIVE
SARS Coronavirus 2 by RT PCR: NEGATIVE

## 2024-04-14 LAB — CBC
HCT: 45.5 % (ref 39.0–52.0)
Hemoglobin: 14.6 g/dL (ref 13.0–17.0)
MCH: 30.4 pg (ref 26.0–34.0)
MCHC: 32.1 g/dL (ref 30.0–36.0)
MCV: 94.8 fL (ref 80.0–100.0)
Platelets: 399 K/uL (ref 150–400)
RBC: 4.8 MIL/uL (ref 4.22–5.81)
RDW: 12.4 % (ref 11.5–15.5)
WBC: 14.1 K/uL — ABNORMAL HIGH (ref 4.0–10.5)
nRBC: 0 % (ref 0.0–0.2)

## 2024-04-14 LAB — GLUCOSE, CAPILLARY: Glucose-Capillary: 177 mg/dL — ABNORMAL HIGH (ref 70–99)

## 2024-04-14 LAB — LIPASE, BLOOD: Lipase: 60 U/L — ABNORMAL HIGH (ref 11–51)

## 2024-04-14 LAB — COMPREHENSIVE METABOLIC PANEL WITH GFR
ALT: 25 U/L (ref 0–44)
AST: 25 U/L (ref 15–41)
Albumin: 5 g/dL (ref 3.5–5.0)
Alkaline Phosphatase: 78 U/L (ref 38–126)
Anion gap: 25 — ABNORMAL HIGH (ref 5–15)
BUN: 43 mg/dL — ABNORMAL HIGH (ref 6–20)
CO2: 26 mmol/L (ref 22–32)
Calcium: 10.4 mg/dL — ABNORMAL HIGH (ref 8.9–10.3)
Chloride: 102 mmol/L (ref 98–111)
Creatinine, Ser: 1.18 mg/dL (ref 0.61–1.24)
GFR, Estimated: >60 mL/min
Glucose, Bld: 179 mg/dL — ABNORMAL HIGH (ref 70–99)
Potassium: 3.9 mmol/L (ref 3.5–5.1)
Sodium: 154 mmol/L — ABNORMAL HIGH (ref 135–145)
Total Bilirubin: 0.7 mg/dL (ref 0.0–1.2)
Total Protein: 10.7 g/dL — ABNORMAL HIGH (ref 6.5–8.1)

## 2024-04-14 MED ORDER — TAMSULOSIN HCL 0.4 MG PO CAPS
0.4000 mg | ORAL_CAPSULE | Freq: Every day | ORAL | Status: DC
  Administered 2024-04-17: 0.4 mg via ORAL
  Filled 2024-04-14 (×4): qty 1

## 2024-04-14 MED ORDER — SODIUM CHLORIDE 0.9 % IV BOLUS
1000.0000 mL | Freq: Once | INTRAVENOUS | Status: AC
  Administered 2024-04-14: 1000 mL via INTRAVENOUS

## 2024-04-14 MED ORDER — SODIUM CHLORIDE 0.9 % IV BOLUS: 1000.0000 mL | Freq: Once | INTRAVENOUS | Status: DC

## 2024-04-14 MED ORDER — ONDANSETRON HCL 4 MG/2ML IJ SOLN
4.0000 mg | Freq: Four times a day (QID) | INTRAMUSCULAR | Status: DC | PRN
  Administered 2024-04-14 – 2024-04-16 (×3): 4 mg via INTRAVENOUS
  Filled 2024-04-14 (×3): qty 2

## 2024-04-14 MED ORDER — GABAPENTIN 100 MG PO CAPS
100.0000 mg | ORAL_CAPSULE | Freq: Two times a day (BID) | ORAL | Status: DC
  Administered 2024-04-15 – 2024-04-17 (×4): 100 mg via ORAL
  Filled 2024-04-14 (×6): qty 1

## 2024-04-14 MED ORDER — SODIUM CHLORIDE 0.9 % IV SOLN
1.0000 g | INTRAVENOUS | Status: DC
  Administered 2024-04-15: 1 g via INTRAVENOUS
  Filled 2024-04-14: qty 10

## 2024-04-14 MED ORDER — GLUCERNA SHAKE PO LIQD: 237.0000 mL | Freq: Three times a day (TID) | ORAL | Status: DC

## 2024-04-14 MED ORDER — SODIUM CHLORIDE 0.9 % IV SOLN: INTRAVENOUS | Status: DC

## 2024-04-14 MED ORDER — ACETAMINOPHEN 325 MG PO TABS: 650.0000 mg | ORAL_TABLET | Freq: Four times a day (QID) | ORAL | Status: DC | PRN

## 2024-04-14 MED ORDER — IOHEXOL 300 MG/ML  SOLN
80.0000 mL | Freq: Once | INTRAMUSCULAR | Status: AC | PRN
  Administered 2024-04-14: 80 mL via INTRAVENOUS

## 2024-04-14 MED ORDER — ENOXAPARIN SODIUM 30 MG/0.3ML IJ SOSY
30.0000 mg | PREFILLED_SYRINGE | INTRAMUSCULAR | Status: DC
  Administered 2024-04-14 – 2024-04-17 (×3): 30 mg via SUBCUTANEOUS
  Filled 2024-04-14 (×4): qty 0.3

## 2024-04-14 MED ORDER — ACETAMINOPHEN 650 MG RE SUPP
650.0000 mg | Freq: Four times a day (QID) | RECTAL | Status: DC | PRN
  Filled 2024-04-14 (×2): qty 1

## 2024-04-14 MED ORDER — DEXTROSE 5 % IV SOLN: INTRAVENOUS | Status: DC

## 2024-04-14 MED ORDER — ESCITALOPRAM OXALATE 10 MG PO TABS
10.0000 mg | ORAL_TABLET | Freq: Every day | ORAL | Status: DC
  Administered 2024-04-17: 10 mg via ORAL
  Filled 2024-04-14 (×4): qty 1

## 2024-04-14 MED ORDER — ONDANSETRON HCL 4 MG/2ML IJ SOLN
4.0000 mg | Freq: Once | INTRAMUSCULAR | Status: AC
  Administered 2024-04-14: 4 mg via INTRAVENOUS
  Filled 2024-04-14: qty 2

## 2024-04-14 MED ORDER — SODIUM CHLORIDE 0.9 % IV SOLN
1.0000 g | Freq: Once | INTRAVENOUS | Status: AC
  Administered 2024-04-14: 1 g via INTRAVENOUS
  Filled 2024-04-14: qty 10

## 2024-04-14 MED ORDER — INSULIN ASPART 100 UNIT/ML IJ SOLN
0.0000 [IU] | Freq: Three times a day (TID) | INTRAMUSCULAR | Status: DC
  Administered 2024-04-15 (×2): 2 [IU] via SUBCUTANEOUS
  Administered 2024-04-16: 1 [IU] via SUBCUTANEOUS
  Administered 2024-04-16: 2 [IU] via SUBCUTANEOUS
  Administered 2024-04-17: 1 [IU] via SUBCUTANEOUS
  Administered 2024-04-18: 2 [IU] via SUBCUTANEOUS
  Filled 2024-04-14 (×6): qty 1

## 2024-04-14 NOTE — ED Provider Notes (Signed)
 " [place] EMERGENCY DEPARTMENT AT [facility] Provider Note   CSN: [id number] Arrival date & time: 04/14/24  1151     Patient presents with: Abdominal Pain   Garrett [name] is a 39 y.o. male.  He is brought in by ambulance from his long-term care facility.  Per EMS staff was concerned that he has abdominal pain and that he has been spitting up yellow phlegm.  Patient is a very poor historian despite using Immunologist.  He does endorse abdominal pain and that it has been there a long time.  He does not answer any other questions.   The history is provided by the patient and the EMS personnel. The history is limited by a language barrier. A language interpreter was used (ipad [phone]).  Abdominal Pain Pain location:  Generalized Associated symptoms: nausea and vomiting   Associated symptoms: no fever and no shortness of breath        Prior to Admission medications  Medication Sig Start Date End Date Taking? Authorizing Provider  Accu-Chek Softclix Lancets lancets Use to test blood sugars up to 4 times daily as directed. 07/11/21   Alto Isaiah CROME, NP  acetaminophen  (TYLENOL ) 325 MG tablet Take 2 tablets (650 mg total) by mouth every 6 (six) hours as needed for mild pain or moderate pain. 07/11/21   Alto Isaiah CROME, NP  ANTI-DIARRHEAL 2 MG tablet Take 2 mg by mouth as needed for diarrhea or loose stools. 02/22/22   [provider]  benztropine  (COGENTIN ) 0.5 MG tablet Take 1 tablet (0.5 mg total) by mouth 2 (two) times daily. Patient taking differently: Take 0.5 mg by mouth at bedtime. 07/11/21   Alto Isaiah CROME, NP  Blood Glucose Monitoring Suppl (BLOOD GLUCOSE MONITOR SYSTEM) w/Device KIT Use to test blood sugars up to 4 times daily as directed. 07/11/21   Alto Isaiah CROME, NP  escitalopram  (LEXAPRO ) 10 MG tablet Take 1 tablet (10 mg total) by mouth daily. 07/12/21   Alto Isaiah CROME, NP  feeding supplement, GLUCERNA SHAKE, (GLUCERNA SHAKE) LIQD Take 237 mLs by  mouth 3 (three) times daily between meals. 07/11/21   Alto Isaiah CROME, NP  gabapentin  (NEURONTIN ) 100 MG capsule Take 1 capsule (100 mg total) by mouth 2 (two) times daily. 07/11/21   Alto Isaiah CROME, NP  glucose blood (ACCU-CHEK GUIDE) test strip Use to check blood sugars up to 4 times daily as directed. 07/11/21   Alto Isaiah CROME, NP  insulin  aspart (NOVOLOG ) 100 UNIT/ML FlexPen Inject 0-6 Units into the skin 3 (three) times daily with meals. 07/11/21   Alto Isaiah CROME, NP  insulin  glargine (LANTUS ) 100 UNIT/ML Solostar Pen Inject 10 Units into the skin daily. 03/10/22   Ricky Fines, MD  Insulin  Pen Needle 32G X 4 MM MISC Use to inject insulin  up to 4 times daily. 07/11/21   Alto Isaiah CROME, NP  LORazepam  (ATIVAN ) 0.5 MG tablet Take 1 tablet (0.5 mg total) by mouth at bedtime. 07/11/21   Alto Isaiah CROME, NP  miconazole  nitrate (MICATIN) POWD Apply 1 application. topically 3 (three) times daily. 07/11/21   Alto Isaiah CROME, NP  midodrine  (PROAMATINE ) 10 MG tablet Take 1 tablet (10 mg total) by mouth 3 (three) times daily with meals. 03/10/22   Ricky Fines, MD  nitrofurantoin , macrocrystal-monohydrate, (MACROBID ) 100 MG capsule Take 1 capsule (100 mg total) by mouth 2 (two) times daily. 12/03/22   Odell Balls, PA-C  ondansetron  (ZOFRAN -ODT) 4 MG disintegrating tablet Take 1 tablet (  4 mg total) by mouth every 8 (eight) hours as needed for up to 15 doses for nausea or vomiting. 02/27/22   Cottie Donnice PARAS, MD  promethazine  (PHENERGAN ) 25 MG suppository Place 1 suppository (25 mg total) rectally every 8 (eight) hours as needed for refractory nausea / vomiting. 03/10/22   Ricky Fines, MD  tamsulosin  (FLOMAX ) 0.4 MG CAPS capsule Take 1 capsule (0.4 mg total) by mouth daily. 02/09/22   Wrenn, John, MD    Allergies: Patient has no known allergies.    Review of Systems  Constitutional:  Negative for fever.  Respiratory:  Negative for shortness of breath.   Gastrointestinal:  Positive for abdominal  pain, nausea and vomiting.    Updated Vital Signs BP (!) 125/93   Pulse (!) 110   Temp 98.2 F (36.8 C) (Oral)   Resp 18   Ht 5' 3 (1.6 m)   Wt 41.6 kg   SpO2 99%   BMI 16.26 kg/m   Physical Exam Vitals and nursing note reviewed.  Constitutional:      Appearance: He is well-developed.  HENT:     Head: Normocephalic and atraumatic.  Eyes:     Conjunctiva/sclera: Conjunctivae normal.  Cardiovascular:     Rate and Rhythm: Normal rate and regular rhythm.     Heart sounds: No murmur heard. Pulmonary:     Effort: Pulmonary effort is normal. No respiratory distress.     Breath sounds: Normal breath sounds.  Abdominal:     Palpations: Abdomen is soft.     Tenderness: There is no abdominal tenderness.  Musculoskeletal:     Cervical back: Neck supple.  Skin:    General: Skin is warm and dry.  Neurological:     General: No focal deficit present.     Mental Status: He is alert.     GCS: GCS eye subscore is 4. GCS verbal subscore is 5. GCS motor subscore is 6.     (all labs ordered are listed, but only abnormal results are displayed) Labs Reviewed  LIPASE, BLOOD - Abnormal; Notable for the following components:      Result Value   Lipase 60 (*)    All other components within normal limits  COMPREHENSIVE METABOLIC PANEL WITH GFR - Abnormal; Notable for the following components:   Sodium 154 (*)    Glucose, Bld 179 (*)    BUN 43 (*)    Calcium 10.4 (*)    Total Protein 10.7 (*)    Anion gap 25 (*)    All other components within normal limits  CBC - Abnormal; Notable for the following components:   WBC 14.1 (*)    All other components within normal limits  URINALYSIS, W/ REFLEX TO CULTURE (INFECTION SUSPECTED) - Abnormal; Notable for the following components:   APPearance HAZY (*)    Specific Gravity, Urine >1.046 (*)    Hgb urine dipstick SMALL (*)    Ketones, ur 20 (*)    Protein, ur 100 (*)    Nitrite POSITIVE (*)    Leukocytes,Ua LARGE (*)    Bacteria, UA FEW  (*)    All other components within normal limits  RESP PANEL BY RT-PCR (RSV, FLU A&B, COVID)  [id number]  URINE CULTURE    EKG: None  Radiology: CT ABDOMEN PELVIS W CONTRAST Result Date: 04/14/2024 CLINICAL DATA:  Abdominal pain, failure to thrive  EXAM: CT ABDOMEN AND PELVIS WITH CONTRAST TECHNIQUE: Multidetector CT imaging of the abdomen and pelvis was performed using the standard  protocol following bolus administration of intravenous contrast. RADIATION DOSE REDUCTION: This exam was performed according to the departmental dose-optimization program which includes automated exposure control, adjustment of the mA and/or kV according to patient size and/or use of iterative reconstruction technique. CONTRAST:  80mL OMNIPAQUE  IOHEXOL  300 MG/ML  SOLN COMPARISON:  12/03/2022 FINDINGS: Lower chest: No acute pleural or parenchymal lung disease. Hepatobiliary: Multiple calcified gallstones without evidence of acute cholecystitis. Liver is unremarkable. No biliary duct dilation. Pancreas: Unremarkable. No pancreatic ductal dilatation or surrounding inflammatory changes. Spleen: Normal in size without focal abnormality. Adrenals/Urinary Tract: Indwelling suprapubic catheter again identified, with chronic bladder wall thickening. The kidneys enhance normally and symmetrically. No urinary tract calculi or obstruction. The adrenals are unremarkable. Stomach/Bowel: No bowel obstruction or ileus. The appendix is surgically absent. No bowel wall thickening or inflammatory change. Vascular/Lymphatic: Aortic atherosclerosis. No enlarged abdominal or pelvic lymph nodes. Reproductive: Prostate is unremarkable. Other: No free fluid or free intraperitoneal gas. No abdominal wall hernia. Musculoskeletal: No acute or destructive bony abnormalities. Reconstructed images demonstrate no additional findings. IMPRESSION: 1. Cholelithiasis without evidence of acute cholecystitis. 2. Stable indwelling suprapubic catheter, with chronic  bladder wall thickening. 3.  Aortic Atherosclerosis (ICD10-I70.0). Electronically Signed   By: Ozell Daring M.D.   On: 04/14/2024 15:43   DG Chest Port 1 View Result Date: 04/14/2024 EXAM: 1 VIEW(S) XRAY OF THE CHEST 04/14/2024 12:24:18 PM COMPARISON: 03/03/2022 CLINICAL HISTORY: cough FINDINGS: LUNGS AND PLEURA: Lung volumes are low. Multiple air-filled loops of small and large bowel are noted within the imaged portion of the upper abdomen extending along the undersurface of the hemidiaphragms. No focal pulmonary opacity. No pleural effusion. No pneumothorax. HEART AND MEDIASTINUM: No acute abnormality of the cardiac and mediastinal silhouettes. BONES AND SOFT TISSUES: No acute osseous abnormality. IMPRESSION: 1. No acute findings. 2. Low lung volumes. Electronically signed by: Waddell Calk MD MD 04/14/2024 02:27 PM EST RP Workstation: [id number]     Procedures   Medications Ordered in the ED  sodium chloride  0.9 % bolus 1,000 mL (has no administration in time range)  cefTRIAXone  (ROCEPHIN ) 1 g in sodium chloride  0.9 % 100 mL IVPB (has no administration in time range)  sodium chloride  0.9 % bolus 1,000 mL (0 mLs Intravenous Stopped 04/14/24 1330)  ondansetron  (ZOFRAN ) injection 4 mg (4 mg Intravenous Given 04/14/24 1229)  sodium chloride  0.9 % bolus 1,000 mL (1,000 mLs Intravenous New Bag/Given 04/14/24 1339)  iohexol  (OMNIPAQUE ) 300 MG/ML solution 80 mL (80 mLs Intravenous Contrast Given 04/14/24 1408)    Clinical Course as of 04/14/24 1641  Mon Apr 14, 2024  1304 Chest x-ray without acute infiltrate.  Awaiting radiology reading. [MB]    Clinical Course User Index [MB] Towana Ozell BROCKS, MD                                 Medical Decision Making Amount and/or Complexity of Data Reviewed Labs: ordered. Radiology: ordered.  Risk Prescription drug management. Decision regarding hospitalization.   This patient complains of possible abdominal pain; this involves an extensive number  of treatment Options and is a complaint that carries with it a high risk of complications and morbidity. The differential includes infection including UTI, pyelonephritis, cholecystitis, diverticulitis, perforation  I ordered, reviewed and interpreted labs, which included CBC with elevated white count, chemistries with elevated sodium, urinalysis concerning for infection sent for culture, COVID and flu negative I ordered medication IV fluids and nausea  medication and reviewed PMP when indicated. I ordered imaging studies which included chest x-ray, CT abdomen and pelvis and I independently    visualized and interpreted imaging which showed cholelithiasis, bladder thickening Additional history obtained from EMS Previous records obtained and reviewed in epic, frequent ED visits for urinary tract infections Cardiac monitoring reviewed, sinus tachycardia Social determinants considered, no significant barriers Critical Interventions: None  After the interventions stated above, I reevaluated the patient and found patient remains fairly flat affect and not really interactive.  Nontoxic-appearing though Admission and further testing considered, his care is signed out to Dr. Cleotilde to follow-up on results of urinalysis and CAT scan.  Anticipate will need admission due to his chronic issues      Final diagnoses:  Sepsis with acute renal failure without septic shock, due to unspecified organism, unspecified acute renal failure type [facility])    ED Discharge Orders     None          Towana Ozell BROCKS, MD 04/14/24 1644  "

## 2024-04-14 NOTE — Plan of Care (Signed)
  Problem: Nutrition: Goal: Adequate nutrition will be maintained Outcome: Not Progressing   Problem: Coping: Goal: Level of anxiety will decrease Outcome: Progressing   Problem: Pain Managment: Goal: General experience of comfort will improve and/or be controlled Outcome: Progressing

## 2024-04-14 NOTE — ED Notes (Signed)
Brief changed and linen changed

## 2024-04-14 NOTE — ED Triage Notes (Addendum)
 Patient BIB [facility] from [facility]. He is at facility for failure to thrive , nurse reports patient seems to have abdominal pain and notice spitting up yellow phlegm. Nurse at facility reports patient speaks karen.  Interpreter brought in for patient, Patient reports abdominal pain.

## 2024-04-14 NOTE — ED Notes (Signed)
 Changed Pts urine drainage back.

## 2024-04-14 NOTE — ED Provider Notes (Signed)
 I discussed the case with the hospitalist at 4:30 PM, the patient ultimately appears to have a urinary tract infection, hypernatremia, dehydration tachycardia and leukocytosis.  He was given antibiotics fluids and will be admitted to the hospital   Cleotilde Rogue, MD 04/14/24 1627

## 2024-04-14 NOTE — ED Notes (Signed)
 Patient transported to CT

## 2024-04-14 NOTE — H&P (Signed)
 " History and Physical    PatientKairyn [name] FMW:[id number] DOB: 1985/12/26 DOA: 04/14/2024 DOS: the patient was seen and examined on 04/14/2024 PCP: Wilmon Penton, NP  Patient coming from: SNF  Chief Complaint: Generalized weakness Chief Complaint  Patient presents with   Abdominal Pain   HPI: Garrett [name] is a 39 y.o. 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  Report from the EMS team showed that patient has been having some abdominal pain, duration unknown.  Patient does not contribute to history that much.  He is originally from [place].  According to the ED physician who has personally known this patient from prior hospitalization he is able to speak some English although he preferentially decides not to talk most of the instances.  He denies nausea vomiting diarrhea, constipation.  He endorses some pain in the lower abdomen.  ED course: Upon arrival to the emergency room patient was found to have temperature 98.2, respiratory rate 18, pulse 109, blood pressure 138/84 saturating 98% on room air Chest x-ray did not show acute pathology. CT scan of the abdomen and pelvics showed findings of cholelithiasis without evidence of acute cholecystitis and a stable indwelling suprapubic catheter. Patient appeared clinically dehydrated on presentation with sodium of 154 as well as urinalysis showing UTI.  Given above findings [facility] was therefore contacted to admit patient for further management.  Review of Systems: As mentioned in the history of present illness. All other systems reviewed and are negative. Past Medical History:  Diagnosis Date   Acute metabolic encephalopathy 03/09/2020   Altered mental status    Anemia 05/05/2021   Chronic Hepatitis B 05/12/2021   Chronic hepatitis B  without hepatic coma (HCC) 01/14/2022   Depression    Diabetes mellitus (HCC)    Elevated LFTs 03/09/2020   Hypoalbuminemia 01/24/2022   Hyponatremia 01/24/2022   Hypotension 01/26/2022   Major depressive disorder, recurrent episode, moderate with mood-congruent psychotic features (HCC)    Major depressive disorder, recurrent severe without psychotic features (HCC) 06/20/2017   Physical deconditioning    Postoperative intra-abdominal abscess (HCC) 01/20/2022   Severe malnutrition 01/18/2022   Severe malnutrition 01/18/2022   Past Surgical History:  Procedure Laterality Date   APPENDECTOMY     EXPLORATORY LAPAROTOMY     IR CATHETER TUBE CHANGE  11/01/2022   Social History:  reports that he has quit smoking. His smoking use included cigarettes. He has never used smokeless tobacco. He reports that he does not currently use alcohol. He reports that he does not currently use drugs.  Allergies[1]  History reviewed. No pertinent family history.  Prior to Admission medications  Medication Sig Start Date End Date Taking? Authorizing Provider  Accu-Chek Softclix Lancets lancets Use to test blood sugars up to 4 times daily as directed. 07/11/21   Alto Isaiah CROME, NP  acetaminophen  (TYLENOL ) 325 MG tablet Take 2 tablets (650 mg total) by mouth every 6 (six) hours as needed for mild pain or moderate pain. 07/11/21   Alto Isaiah CROME, NP  ANTI-DIARRHEAL 2 MG tablet Take 2 mg by mouth as needed for diarrhea or loose stools. 02/22/22   [provider]  benztropine  (COGENTIN ) 0.5 MG tablet Take 1 tablet (0.5 mg total) by mouth 2 (two) times daily. Patient taking differently: Take 0.5 mg by mouth at bedtime. 07/11/21   Alto,  Isaiah CROME, NP  Blood Glucose Monitoring Suppl (BLOOD GLUCOSE MONITOR SYSTEM) w/Device KIT Use to test blood sugars up to 4 times daily as directed. 07/11/21   Alto Isaiah CROME, NP  escitalopram  (LEXAPRO ) 10 MG tablet Take 1 tablet (10 mg total) by mouth daily. 07/12/21    Alto Isaiah CROME, NP  feeding supplement, GLUCERNA SHAKE, (GLUCERNA SHAKE) LIQD Take 237 mLs by mouth 3 (three) times daily between meals. 07/11/21   Alto Isaiah CROME, NP  gabapentin  (NEURONTIN ) 100 MG capsule Take 1 capsule (100 mg total) by mouth 2 (two) times daily. 07/11/21   Alto Isaiah CROME, NP  glucose blood (ACCU-CHEK GUIDE) test strip Use to check blood sugars up to 4 times daily as directed. 07/11/21   Alto Isaiah CROME, NP  insulin  aspart (NOVOLOG ) 100 UNIT/ML FlexPen Inject 0-6 Units into the skin 3 (three) times daily with meals. 07/11/21   Alto Isaiah CROME, NP  insulin  glargine (LANTUS ) 100 UNIT/ML Solostar Pen Inject 10 Units into the skin daily. 03/10/22   Ricky Fines, MD  Insulin  Pen Needle 32G X 4 MM MISC Use to inject insulin  up to 4 times daily. 07/11/21   Alto Isaiah CROME, NP  LORazepam  (ATIVAN ) 0.5 MG tablet Take 1 tablet (0.5 mg total) by mouth at bedtime. 07/11/21   Alto Isaiah CROME, NP  miconazole  nitrate (MICATIN) POWD Apply 1 application. topically 3 (three) times daily. 07/11/21   Alto Isaiah CROME, NP  midodrine  (PROAMATINE ) 10 MG tablet Take 1 tablet (10 mg total) by mouth 3 (three) times daily with meals. 03/10/22   Ricky Fines, MD  nitrofurantoin , macrocrystal-monohydrate, (MACROBID ) 100 MG capsule Take 1 capsule (100 mg total) by mouth 2 (two) times daily. 12/03/22   Odell Balls, PA-C  ondansetron  (ZOFRAN -ODT) 4 MG disintegrating tablet Take 1 tablet (4 mg total) by mouth every 8 (eight) hours as needed for up to 15 doses for nausea or vomiting. 02/27/22   Cottie Donnice PARAS, MD  promethazine  (PHENERGAN ) 25 MG suppository Place 1 suppository (25 mg total) rectally every 8 (eight) hours as needed for refractory nausea / vomiting. 03/10/22   Ricky Fines, MD  tamsulosin  (FLOMAX ) 0.4 MG CAPS capsule Take 1 capsule (0.4 mg total) by mouth daily. 02/09/22   Watt Rush, MD    Physical Exam: Vitals:   04/14/24 1245 04/14/24 1330 04/14/24 1345 04/14/24 1500  BP: 138/84 (!)  122/95 (!) 125/93   Pulse: (!) 109 (!) 107 (!) 104 (!) 110  Resp:      Temp:      TempSrc:      SpO2: 98% 97% 94% 99%  Weight:      Height:       General: Young male but appears older than age, in no respiratory distress, clinically dehydrated CNS: S1, S2 are normal CVS: Alert and oriented x 3 Respiratory: Decreased air entry bibasilarly Musculoskeletal: No lower extremity pitting edema Abdomen: Nontender no masses palpable, suprapubic catheter in place  Data Reviewed:  Chest x-ray shows no acute findings CT scan of the abdomen showed cholelithiasis without evidence of acute cholecystitis     Latest Ref Rng & Units 04/14/2024   12:02 PM 03/20/2023    6:44 PM 12/03/2022    6:08 PM  CBC  WBC 4.0 - 10.5 K/uL 14.1  5.0  8.9   Hemoglobin 13.0 - 17.0 g/dL 85.3  89.5  87.9   Hematocrit 39.0 - 52.0 % 45.5  32.2  35.5   Platelets 150 - 400 K/uL 399  141  221        Latest Ref Rng & Units 04/14/2024   12:02 PM 03/20/2023    6:44 PM 12/03/2022    6:08 PM  BMP  Glucose 70 - 99 mg/dL 820  883  884   BUN 6 - 20 mg/dL 43  17  20   Creatinine 0.61 - 1.24 mg/dL 8.81  9.33  9.23   Sodium 135 - 145 mmol/L 154  139  136   Potassium 3.5 - 5.1 mmol/L 3.9  3.3  3.3   Chloride 98 - 111 mmol/L 102  106  101   CO2 22 - 32 mmol/L 26  27  27    Calcium 8.9 - 10.3 mg/dL 89.5  8.6  8.5      Assessment and Plan:  Sepsis secondary to urinary tract infection Likely secondary to chronic indwelling suprapubic catheter  patient presented with WBC 14.1, pulse 109 the setting of urinalysis showing UTI Continue ceftriaxone  Follow-up with urine culture results  Hypernatremia likely secondary to dehydration due to poor oral intake Patient presented with sodium 154 Patient received about 3 L of IV normal saline in the emergency room for volume resuscitation We will treat high sodium level with D5W as patient's dehydration is now corrected Monitor electrolytes closely  Chronic hepatitis B Unsure if  patient has had any treatment Continue outpatient follow-up  Cholelithiasis without evidence of acute cholecystitis  Continue to monitor closely  Depression Continue escitalopram   Diabetes mellitus type 2 Monitor glucose level closely Placed on insulin  therapy  Failure to thrive / severe malnutrition Patient lives in a facility chronically Dietitian consulted Dietary supplementation and  DVT prophylaxis-placed on Lovenox    Advance Care Planning:   Code Status: Prior full code  Consults: None  Family Communication: None  Severity of Illness: The appropriate patient status for this patient is INPATIENT. Inpatient status is judged to be reasonable and necessary in order to provide the required intensity of service to ensure the patient's safety. The patient's presenting symptoms, physical exam findings, and initial radiographic and laboratory data in the context of their chronic comorbidities is felt to place them at high risk for further clinical deterioration. Furthermore, it is not anticipated that the patient will be medically stable for discharge from the hospital within 2 midnights of admission.   * I certify that at the point of admission it is my clinical judgment that the patient will require inpatient hospital care spanning beyond 2 midnights from the point of admission due to high intensity of service, high risk for further deterioration and high frequency of surveillance required.*  Author: Drue ONEIDA Potter, MD 04/14/2024 4:37 PM  For on call review [email or web address].      [1] No Known Allergies  "

## 2024-04-15 DIAGNOSIS — E43 Unspecified severe protein-calorie malnutrition: Secondary | ICD-10-CM | POA: Insufficient documentation

## 2024-04-15 LAB — BASIC METABOLIC PANEL WITH GFR
Anion gap: 8 (ref 5–15)
BUN: 22 mg/dL — ABNORMAL HIGH (ref 6–20)
CO2: 31 mmol/L (ref 22–32)
Calcium: 7.9 mg/dL — ABNORMAL LOW (ref 8.9–10.3)
Chloride: 112 mmol/L — ABNORMAL HIGH (ref 98–111)
Creatinine, Ser: 0.85 mg/dL (ref 0.61–1.24)
GFR, Estimated: >60 mL/min
Glucose, Bld: 193 mg/dL — ABNORMAL HIGH (ref 70–99)
Potassium: 3.1 mmol/L — ABNORMAL LOW (ref 3.5–5.1)
Sodium: 151 mmol/L — ABNORMAL HIGH (ref 135–145)

## 2024-04-15 LAB — CBC
HCT: 30.1 % — ABNORMAL LOW (ref 39.0–52.0)
Hemoglobin: 9.6 g/dL — ABNORMAL LOW (ref 13.0–17.0)
MCH: 30.7 pg (ref 26.0–34.0)
MCHC: 31.9 g/dL (ref 30.0–36.0)
MCV: 96.2 fL (ref 80.0–100.0)
Platelets: 185 K/uL (ref 150–400)
RBC: 3.13 MIL/uL — ABNORMAL LOW (ref 4.22–5.81)
RDW: 12.8 % (ref 11.5–15.5)
WBC: 8.4 K/uL (ref 4.0–10.5)
nRBC: 0 % (ref 0.0–0.2)

## 2024-04-15 LAB — GLUCOSE, CAPILLARY
Glucose-Capillary: 187 mg/dL — ABNORMAL HIGH (ref 70–99)
Glucose-Capillary: 115 mg/dL — ABNORMAL HIGH (ref 70–99)
Glucose-Capillary: 166 mg/dL — ABNORMAL HIGH (ref 70–99)
Glucose-Capillary: 173 mg/dL — ABNORMAL HIGH (ref 70–99)
Glucose-Capillary: 116 mg/dL — ABNORMAL HIGH (ref 70–99)

## 2024-04-15 LAB — HEMOGLOBIN A1C
Hgb A1c MFr Bld: 5.2 % (ref 4.8–5.6)
Mean Plasma Glucose: 102.54 mg/dL

## 2024-04-15 LAB — HIV ANTIBODY (ROUTINE TESTING W REFLEX): HIV Screen 4th Generation wRfx: NONREACTIVE

## 2024-04-15 MED ORDER — GLUCERNA SHAKE PO LIQD
237.0000 mL | Freq: Three times a day (TID) | ORAL | Status: DC
  Administered 2024-04-17 – 2024-04-18 (×5): 237 mL via ORAL

## 2024-04-15 MED ORDER — SODIUM CHLORIDE 0.9 % IV SOLN
1.0000 g | Freq: Four times a day (QID) | INTRAVENOUS | Status: DC
  Filled 2024-04-15 (×4): qty 1000

## 2024-04-15 MED ORDER — PROCHLORPERAZINE EDISYLATE 10 MG/2ML IJ SOLN
10.0000 mg | Freq: Once | INTRAMUSCULAR | Status: AC | PRN
  Administered 2024-04-15: 10 mg via INTRAVENOUS
  Filled 2024-04-15: qty 2

## 2024-04-15 MED ORDER — DEXTROSE 5 % IV SOLN: INTRAVENOUS | Status: AC

## 2024-04-15 MED ORDER — SODIUM CHLORIDE 0.9 % IV SOLN
2.0000 g | INTRAVENOUS | Status: DC
  Administered 2024-04-15 – 2024-04-16 (×4): 2 g via INTRAVENOUS
  Filled 2024-04-15 (×14): qty 2000

## 2024-04-15 MED ORDER — POTASSIUM CHLORIDE 10 MEQ/100ML IV SOLN
10.0000 meq | INTRAVENOUS | Status: AC
  Administered 2024-04-15 – 2024-04-16 (×4): 10 meq via INTRAVENOUS
  Filled 2024-04-15 (×4): qty 100

## 2024-04-15 NOTE — Progress Notes (Addendum)
 Initial Nutrition Assessment  DOCUMENTATION CODES:  Severe malnutrition in context of social or environmental circumstances, Underweight  INTERVENTION:  When diet advanced, add: Ensure Plus High Protein po TID, each supplement provides 350 kcal and 20 grams of protein MVI with minerals daily Magic cup TID with meals, each supplement provides 290 kcal and 9 grams of protein  NUTRITION DIAGNOSIS:  Severe Malnutrition related to social / environmental circumstances as evidenced by severe muscle depletion, severe fat depletion.  GOAL:  Patient will meet greater than or equal to 90% of their needs  MONITOR:  Diet advancement, PO intake  REASON FOR ASSESSMENT:  Consult Calorie Count  ASSESSMENT:  39 yo male admitted with sepsis d/t UTI, hypernatremia, FTT. PMH includes postoperative intra-abdominal abscess S/P suprapubic catheter placement, DM-2, major depressive disorder, chronic hepatitis B, severe malnutrition.  Patient curled up in bed and did not open eyes during RD visit. Unable to engage patient for interpreter use. Unsure of usual intake at SNF. Patient meets criteria for severe malnutrition, given severe depletion of muscle and subcutaneous fat mass. Currently NPO. RD to monitor for adequacy of oral intake and add PO supplements once diet is advanced. Calorie count not appropriate at this time as patient is NPO.   Usual weight: 47 kg (03/20/23) Current weight: 41.6 kg  11% weight loss within 13 months is not clinically significant. Patient is underweight with BMI=16.3.  Nutritionally Relevant Medications: Scheduled Meds:  enoxaparin  (LOVENOX ) injection  30 mg Subcutaneous Q24H   escitalopram   10 mg Oral Daily   feeding supplement (GLUCERNA SHAKE)  237 mL Oral TID BM   gabapentin   100 mg Oral BID   insulin  aspart  0-9 Units Subcutaneous TID WC   tamsulosin   0.4 mg Oral Daily   Continuous Infusions:  cefTRIAXone  (ROCEPHIN )  IV     dextrose  100 mL/hr at 04/15/24 0700    sodium chloride  Stopped (04/14/24 2013)   PRN Meds:.acetaminophen  **OR** acetaminophen , ondansetron  (ZOFRAN ) IV, prochlorperazine   Labs Reviewed: Na 151 K 3.1 CBG ranges from 177-187 mg/dL over the last 24 hours HgbA1c 5.2  NUTRITION - FOCUSED PHYSICAL EXAM: Flowsheet Row Most Recent Value  Orbital Region Severe depletion  Upper Arm Region Mild depletion  Thoracic and Lumbar Region Mild depletion  Buccal Region Severe depletion  Temple Region Moderate depletion  Clavicle Bone Region Moderate depletion  Clavicle and Acromion Bone Region Moderate depletion  Scapular Bone Region Moderate depletion  Dorsal Hand Unable to assess  Patellar Region Severe depletion  Anterior Thigh Region Severe depletion  Posterior Calf Region Severe depletion  Edema (RD Assessment) Mild  Hair Reviewed  Eyes Unable to assess  Mouth Unable to assess  Skin Reviewed  Nails Unable to assess    Diet Order:   Diet Order             Diet NPO time specified  Diet effective now                   EDUCATION NEEDS:  Not appropriate for education at this time  Skin:  Skin Assessment: Skin Integrity Issues: Skin Integrity Issues:: Other (Comment) Other: sacral wound, stage not specified  Last BM:  no BM documented  Height:  Ht Readings from Last 1 Encounters:  04/14/24 5' 3 (1.6 m)   Weight:  Wt Readings from Last 1 Encounters:  04/14/24 41.6 kg   Ideal Body Weight:  56.4 kg  BMI:  Body mass index is 16.26 kg/m.  Estimated Nutritional Needs:  Kcal:  1700-1900 Protein:  65-75 gm Fluid:  >/= 1.6 L   Suzen HUNT RD, LDN, CNSC Contact via secure chat. If unavailable, use group chat RD Inpatient.

## 2024-04-15 NOTE — Progress Notes (Addendum)
 " PROGRESS NOTE   Garrett [name], is a 39 y.o. male, DOB - Jul 25, 1985, FMW:[id number]  Admit date - 04/14/2024   Admitting Physician Drue ONEIDA Potter, MD  Outpatient Primary MD for the patient is Gammon, Chrystal, NP  LOS - 1  Chief Complaint  Patient presents with   Abdominal Pain      Brief Narrative:  39 y.o. male with medical history significant of chronic history of postoperative intra-abdominal abscess status post Suprapubic catheter placement, severe malnutrition, failure to thrive  admitted on 04/14/24 with Sepsis due to UTI and Hypernatremia with acute metabolic encephalopathy    -Assessment and Plan: 1)Sepsis Secondary to Klebsiella and Enterococcus Faecalis UTI---CAUTI--POA -Pt has chronic indwelling catheter WBC 14.1 >>8.4 -c/n Rocephin  for Klebsiella pending culture data -added Ampicillin  for Enterococcus Faecalis pending culture data  2)Hypernatremia/Dehydration-- Na 154>>151  C/n IVF and monitor lytes  3)Hypokalemia---replace and recheck Check Mag-  4)Chronic Anemia--- Hgb trending down due to IVF/hemodilution -No bleeding concerns   5)DM2---A1C is 5.2 , reflection excellent diabetic control PTA Use Novolog /Humalog Sliding scale insulin  with Accu-Cheks/Fingersticks as ordered   6)Chronic hepatitis B Unsure if patient has had any treatment Continue outpatient follow-up   7)Depression Continue escitalopram    8)Failure to thrive / severe malnutrition Patient lives in a facility chronically Dietitian consult appreciated Give Glucerna  9)acute metabolic encephalopathy ---multi-factorial -partly due to #1 and # 2 above Should improve with treatment of above  Status is: Inpatient   Disposition: The patient is from: SNF              Anticipated d/c is to: SNF              Anticipated d/c date is: 2 days              Patient currently is not medically stable to d/c. Barriers: Not Clinically Stable-   Code Status : -  Code Status: Full Code   DVT Prophylaxis  :    - SCDs  enoxaparin  (LOVENOX ) injection 30 mg Start: 04/14/24 2200   Lab Results  Component Value Date   PLT 185 04/15/2024    Inpatient Medications  Scheduled Meds:  enoxaparin  (LOVENOX ) injection  30 mg Subcutaneous Q24H   escitalopram   10 mg Oral Daily   gabapentin   100 mg Oral BID   insulin  aspart  0-9 Units Subcutaneous TID WC   tamsulosin   0.4 mg Oral Daily   Continuous Infusions:  cefTRIAXone  (ROCEPHIN )  IV 1 g (04/15/24 1714)   dextrose  100 mL/hr at 04/15/24 1559   sodium chloride  Stopped (04/14/24 2013)   PRN Meds:.acetaminophen  **OR** acetaminophen , ondansetron  (ZOFRAN ) IV, prochlorperazine    Anti-infectives (From admission, onward)    Start     Dose/Rate Route Frequency Ordered Stop   04/15/24 1800  cefTRIAXone  (ROCEPHIN ) 1 g in sodium chloride  0.9 % 100 mL IVPB        1 g 200 mL/hr over 30 Minutes Intravenous Every 24 hours 04/14/24 1646     04/14/24 1600  cefTRIAXone  (ROCEPHIN ) 1 g in sodium chloride  0.9 % 100 mL IVPB        1 g 200 mL/hr over 30 Minutes Intravenous  Once 04/14/24 1559 04/14/24 1719         Subjective: Garrett [name] today has no fevers, no emesis,  No chest pain,    Sleepy on and off   Objective: Vitals:   04/14/24 1803 04/14/24 1954 04/15/24 0219 04/15/24 1500  BP: 110/79 115/80 111/62 125/80  Pulse: 100  (!) 102  86  Resp: 18  18 16   Temp: 97.8 F (36.6 C)  97.7 F (36.5 C)   TempSrc: Axillary  Axillary   SpO2: 100% 99% 99% 98%  Weight:      Height:        Intake/Output Summary (Last 24 hours) at 04/15/2024 1910 Last data filed at 04/15/2024 1635 Gross per 24 hour  Intake 1925.68 ml  Output 750 ml  Net 1175.68 ml   Filed Weights   04/14/24 1200  Weight: 41.6 kg    Physical Exam Gen:- sleepy,  frail and cachectic  HEENT:- [place].AT, No sclera icterus Neck-Supple Neck,No JVD,.  Lungs-  CTAB , fair symmetrical air movement CV- S1, S2 normal, regular  Abd-  +ve B.Sounds, Abd Soft, No tenderness, suprapubic catheter    Extremity/Skin:- No  edema, pedal pulses present  Neuro-Psych-affect is flat, cognitive and memory deficits chronic neuro-muskulo deficits GU--suprapubic catheter  Data Reviewed: I have personally reviewed following labs and imaging studies  CBC: Recent Labs  Lab 04/14/24 1202 04/15/24 0744  WBC 14.1* 8.4  HGB 14.6 9.6*  HCT 45.5 30.1*  MCV 94.8 96.2  PLT 399 185   Basic Metabolic Panel: Recent Labs  Lab 04/14/24 1202 04/15/24 0744  NA 154* 151*  K 3.9 3.1*  CL 102 112*  CO2 26 31  GLUCOSE 179* 193*  BUN 43* 22*  CREATININE 1.18 0.85  CALCIUM 10.4* 7.9*   GFR: Estimated Creatinine Clearance: 68 mL/min (by C-G formula based on SCr of 0.85 mg/dL). Liver Function Tests: Recent Labs  Lab 04/14/24 1202  AST 25  ALT 25  ALKPHOS 78  BILITOT 0.7  PROT 10.7*  ALBUMIN 5.0   HbA1C: Recent Labs    04/14/24 1202  HGBA1C 5.2   Recent Results (from the past 240 hours)  Resp panel by RT-PCR (RSV, Flu A&B, Covid) Urine, Suprapubic     Status: None   Collection Time: 04/14/24 12:26 PM   Specimen: Urine, Suprapubic; Nasal Swab  Result Value Ref Range Status   SARS Coronavirus 2 by RT PCR NEGATIVE NEGATIVE Final    Comment: (NOTE) SARS-CoV-2 target nucleic acids are NOT DETECTED.  The SARS-CoV-2 RNA is generally detectable in upper respiratory specimens during the acute phase of infection. The lowest concentration of SARS-CoV-2 viral copies this assay can detect is 138 copies/mL. A negative result does not preclude SARS-Cov-2 infection and should not be used as the sole basis for treatment or other patient management decisions. A negative result may occur with  improper specimen collection/handling, submission of specimen other than nasopharyngeal swab, presence of viral mutation(s) within the areas targeted by this assay, and inadequate number of viral copies(<138 copies/mL). A negative result must be combined with clinical observations, patient history, and  epidemiological information. The expected result is Negative.  Fact Sheet for Patients:  bloggercourse.com  Fact Sheet for Healthcare Providers:  seriousbroker.it  This test is no t yet approved or cleared by the [place]  FDA and  has been authorized for detection and/or diagnosis of SARS-CoV-2 by FDA under an Emergency Use Authorization (EUA). This EUA will remain  in effect (meaning this test can be used) for the duration of the COVID-19 declaration under Section 564(b)(1) of the Act, 21 U.S.C.section 360bbb-3(b)(1), unless the authorization is terminated  or revoked sooner.       Influenza A by PCR NEGATIVE NEGATIVE Final   Influenza B by PCR NEGATIVE NEGATIVE Final    Comment: (NOTE) The Xpert Xpress SARS-CoV-2/FLU/RSV plus assay is  intended as an aid in the diagnosis of influenza from Nasopharyngeal swab specimens and should not be used as a sole basis for treatment. Nasal washings and aspirates are unacceptable for Xpert Xpress SARS-CoV-2/FLU/RSV testing.  Fact Sheet for Patients: bloggercourse.com  Fact Sheet for Healthcare Providers: seriousbroker.it  This test is not yet approved or cleared by the [place]  FDA and has been authorized for detection and/or diagnosis of SARS-CoV-2 by FDA under an Emergency Use Authorization (EUA). This EUA will remain in effect (meaning this test can be used) for the duration of the COVID-19 declaration under Section 564(b)(1) of the Act, 21 U.S.C. section 360bbb-3(b)(1), unless the authorization is terminated or revoked.     Resp Syncytial Virus by PCR NEGATIVE NEGATIVE Final    Comment: (NOTE) Fact Sheet for Patients: bloggercourse.com  Fact Sheet for Healthcare Providers: seriousbroker.it  This test is not yet approved or cleared by the [place]  FDA and has been  authorized for detection and/or diagnosis of SARS-CoV-2 by FDA under an Emergency Use Authorization (EUA). This EUA will remain in effect (meaning this test can be used) for the duration of the COVID-19 declaration under Section 564(b)(1) of the Act, 21 U.S.C. section 360bbb-3(b)(1), unless the authorization is terminated or revoked.  Performed at [facility], [address], [place], [place] [address]   Urine Culture     Status: Abnormal (Preliminary result)   Collection Time: 04/14/24  2:59 PM   Specimen: Urine, Catheterized  Result Value Ref Range Status   Specimen Description   Final    URINE, CATHETERIZED Performed at [facility], [address] [place], [place] [address]    Special Requests   Final    NONE Reflexed from [id number] Performed at [facility], [address], [place], [place] [address]    Culture (A)  Final    >=100,000 COLONIES/mL KLEBSIELLA PNEUMONIAE 30,000 COLONIES/mL ENTEROCOCCUS FAECALIS CULTURE REINCUBATED FOR BETTER GROWTH Performed at [facility], [address], [place], [place] [address]    Report Status PENDING  Incomplete    Radiology Studies: CT ABDOMEN PELVIS W CONTRAST Result Date: 04/14/2024 CLINICAL DATA:  Abdominal pain, failure to thrive  EXAM: CT ABDOMEN AND PELVIS WITH CONTRAST TECHNIQUE: Multidetector CT imaging of the abdomen and pelvis was performed using the standard protocol following bolus administration of intravenous contrast. RADIATION DOSE REDUCTION: This exam was performed according to the departmental dose-optimization program which includes automated exposure control, adjustment of the mA and/or kV according to patient size and/or use of iterative reconstruction technique. CONTRAST:  80mL OMNIPAQUE  IOHEXOL  300 MG/ML  SOLN COMPARISON:  12/03/2022 FINDINGS: Lower chest: No acute pleural or parenchymal lung disease. Hepatobiliary: Multiple calcified gallstones without evidence of acute cholecystitis. Liver is unremarkable. No  biliary duct dilation. Pancreas: Unremarkable. No pancreatic ductal dilatation or surrounding inflammatory changes. Spleen: Normal in size without focal abnormality. Adrenals/Urinary Tract: Indwelling suprapubic catheter again identified, with chronic bladder wall thickening. The kidneys enhance normally and symmetrically. No urinary tract calculi or obstruction. The adrenals are unremarkable. Stomach/Bowel: No bowel obstruction or ileus. The appendix is surgically absent. No bowel wall thickening or inflammatory change. Vascular/Lymphatic: Aortic atherosclerosis. No enlarged abdominal or pelvic lymph nodes. Reproductive: Prostate is unremarkable. Other: No free fluid or free intraperitoneal gas. No abdominal wall hernia. Musculoskeletal: No acute or destructive bony abnormalities. Reconstructed images demonstrate no additional findings. IMPRESSION: 1. Cholelithiasis without evidence of acute cholecystitis. 2. Stable indwelling suprapubic catheter, with chronic bladder wall thickening. 3.  Aortic Atherosclerosis (ICD10-I70.0). Electronically  Signed   By: Ozell Daring M.D.   On: 04/14/2024 15:43   DG Chest Port 1 View Result Date: 04/14/2024 EXAM: 1 VIEW(S) XRAY OF THE CHEST 04/14/2024 12:24:18 PM COMPARISON: 03/03/2022 CLINICAL HISTORY: cough FINDINGS: LUNGS AND PLEURA: Lung volumes are low. Multiple air-filled loops of small and large bowel are noted within the imaged portion of the upper abdomen extending along the undersurface of the hemidiaphragms. No focal pulmonary opacity. No pleural effusion. No pneumothorax. HEART AND MEDIASTINUM: No acute abnormality of the cardiac and mediastinal silhouettes. BONES AND SOFT TISSUES: No acute osseous abnormality. IMPRESSION: 1. No acute findings. 2. Low lung volumes. Electronically signed by: Waddell Calk MD MD 04/14/2024 02:27 PM EST RP Workstation: [id number]   Scheduled Meds:  enoxaparin  (LOVENOX ) injection  30 mg Subcutaneous Q24H   escitalopram   10 mg Oral  Daily   gabapentin   100 mg Oral BID   insulin  aspart  0-9 Units Subcutaneous TID WC   tamsulosin   0.4 mg Oral Daily   Continuous Infusions:  cefTRIAXone  (ROCEPHIN )  IV 1 g (04/15/24 1714)   dextrose  100 mL/hr at 04/15/24 1559   sodium chloride  Stopped (04/14/24 2013)    LOS: 1 day   Rendall Carwin M.D on 04/15/2024 at 7:10 PM  Go to [email or web address] - for contact info  [facility] - Office  [phone]  If 7PM-7AM, please contact night-coverage [email or web address] 04/15/2024, 7:10 PM    "

## 2024-04-15 NOTE — Progress Notes (Signed)
 No sacral wound during shift assessment. Foam placed during assessment. Patient removing foam once it was applied.

## 2024-04-15 NOTE — Plan of Care (Signed)

## 2024-04-15 NOTE — Plan of Care (Signed)
" °  Problem: Clinical Measurements: Goal: Will remain free from infection Outcome: Progressing Goal: Diagnostic test results will improve Outcome: Progressing Goal: Cardiovascular complication will be avoided Outcome: Progressing   Problem: Education: Goal: Knowledge of General Education information will improve Description: Including pain rating scale, medication(s)/side effects and non-pharmacologic comfort measures Outcome: Not Progressing   Problem: Nutrition: Goal: Adequate nutrition will be maintained Outcome: Not Progressing   "

## 2024-04-15 NOTE — TOC Initial Note (Signed)
 Transition of Care [facility]) - Initial/Assessment Note    Patient Details  Name: Garrett [name] MRN: [id number] Date of Birth: 1986/02/26  Transition of [facility]) CM/SW Contact:    Lucie Lunger, LCSWA Phone Number: 04/15/2024, 8:38 AM  Clinical Narrative:                 CSW notes per chart review that pt arrived to hospital from [facility]. CSW spoke to Debbie in admissions with CV who confirms pt is a long term care resident and can return once medically stable. TOC to follow.   Expected Discharge Plan: Long Term Nursing Home Barriers to Discharge: Continued Medical Work up   Patient Goals and CMS Choice Patient states their goals for this hospitalization and ongoing recovery are:: return to LTC CMS Medicare.gov Compare Post Acute Care list provided to:: Legal Guardian Choice offered to / list presented to : [facility] POA / Guardian      Expected Discharge Plan and Services In-house Referral: Clinical Social Work Discharge Planning Services: CM Consult Post Acute Care Choice: Nursing Home Living arrangements for the past 2 months: Skilled Nursing Facility                                      Prior Living Arrangements/Services Living arrangements for the past 2 months: Skilled Nursing Facility Lives with:: Facility Resident Patient language and need for interpreter reviewed:: Yes Do you feel safe going back to the place where you live?: Yes      Need for Family Participation in Patient Care: Yes (Comment) Care giver support system in place?: Yes (comment)   Criminal Activity/Legal Involvement Pertinent to Current Situation/Hospitalization: No - Comment as needed  Activities of Daily Living      Permission Sought/Granted                  Emotional Assessment         Alcohol / Substance Use: Not Applicable Psych Involvement: No (comment)  Admission diagnosis:  Sepsis secondary to UTI (HCC) [A41.9, N39.0] Sepsis with acute renal failure without septic  shock, due to unspecified organism, unspecified acute renal failure type (HCC) [A41.9, R65.20, N17.9] Patient Active Problem List   Diagnosis Date Noted   Sepsis secondary to UTI (HCC) 04/14/2024   Acute cystitis without hematuria 03/10/2022   Intractable nausea and vomiting 03/10/2022   Hypotension 01/26/2022   Hypoalbuminemia 01/24/2022   Hyponatremia 01/24/2022   Postoperative intra-abdominal abscess (HCC) 01/20/2022   Severe malnutrition 01/18/2022   Chronic hepatitis B without hepatic coma (HCC) 01/14/2022   Transaminitis 09/26/2021   Constipation 06/29/2021   Peripheral neuropathy 06/28/2021   Generalized weakness    Low back pain 05/24/2021   Major depressive disorder, recurrent episode, moderate with mood-congruent psychotic features (HCC)    Chronic Hepatitis B 05/12/2021   Physical deconditioning    Anemia 05/05/2021   Altered mental status    Controlled type 2 diabetes mellitus without complication, with long-term current use of insulin  (HCC) 03/09/2020   Acute metabolic encephalopathy 03/09/2020   Elevated LFTs 03/09/2020   Major depressive disorder, recurrent severe without psychotic features (HCC) 06/20/2017   Depression, major, recurrent, severe with psychosis (HCC) 06/19/2017   PCP:  Gammon, Chrystal, NP Pharmacy:   [facility], [place] - [address] [place] [address] Phone: [phone] Fax: [phone]  [facility] - [place], [place] - [address] [place] [address] Phone: [phone] Fax: [phone]     Social Drivers of Health (SDOH) Social History: SDOH Screenings   Food Insecurity: Patient Unable To Answer (04/14/2024)  Housing: Patient Unable To Answer (04/14/2024)  Transportation Needs: Patient Unable To Answer (04/14/2024)  Utilities: Patient Unable To Answer (04/14/2024)  Depression (PHQ2-9): Low Risk (09/26/2021)  Financial Resource Strain: Low Risk (01/17/2022)    Received from [facility]  Tobacco Use: Medium Risk (04/14/2024)   SDOH Interventions:     Readmission Risk Interventions     No data to display

## 2024-04-16 DIAGNOSIS — A419 Sepsis, unspecified organism: Secondary | ICD-10-CM | POA: Diagnosis not present

## 2024-04-16 DIAGNOSIS — N390 Urinary tract infection, site not specified: Secondary | ICD-10-CM | POA: Diagnosis not present

## 2024-04-16 LAB — RENAL FUNCTION PANEL
Albumin: 3 g/dL — ABNORMAL LOW (ref 3.5–5.0)
Anion gap: 10 (ref 5–15)
BUN: 8 mg/dL (ref 6–20)
CO2: 27 mmol/L (ref 22–32)
Calcium: 7.6 mg/dL — ABNORMAL LOW (ref 8.9–10.3)
Chloride: 106 mmol/L (ref 98–111)
Creatinine, Ser: 0.65 mg/dL (ref 0.61–1.24)
GFR, Estimated: >60 mL/min
Glucose, Bld: 182 mg/dL — ABNORMAL HIGH (ref 70–99)
Phosphorus: 1.1 mg/dL — ABNORMAL LOW (ref 2.5–4.6)
Potassium: 2.9 mmol/L — ABNORMAL LOW (ref 3.5–5.1)
Sodium: 144 mmol/L (ref 135–145)

## 2024-04-16 LAB — MAGNESIUM: Magnesium: 2.3 mg/dL (ref 1.7–2.4)

## 2024-04-16 LAB — GLUCOSE, CAPILLARY
Glucose-Capillary: 184 mg/dL — ABNORMAL HIGH (ref 70–99)
Glucose-Capillary: 146 mg/dL — ABNORMAL HIGH (ref 70–99)
Glucose-Capillary: 111 mg/dL — ABNORMAL HIGH (ref 70–99)
Glucose-Capillary: 100 mg/dL — ABNORMAL HIGH (ref 70–99)

## 2024-04-16 MED ORDER — SODIUM CHLORIDE 0.9 % IV SOLN
2.0000 g | Freq: Four times a day (QID) | INTRAVENOUS | Status: DC
  Filled 2024-04-16 (×5): qty 2000

## 2024-04-16 MED ORDER — PROCHLORPERAZINE EDISYLATE 10 MG/2ML IJ SOLN
INTRAMUSCULAR | Status: AC
  Administered 2024-04-16: 10 mg via INTRAVENOUS
  Filled 2024-04-16: qty 2

## 2024-04-16 MED ORDER — PROCHLORPERAZINE EDISYLATE 10 MG/2ML IJ SOLN: 10.0000 mg | Freq: Once | INTRAMUSCULAR | Status: AC | PRN

## 2024-04-16 MED ORDER — AMOXICILLIN-POT CLAVULANATE 875-125 MG PO TABS
1.0000 | ORAL_TABLET | Freq: Two times a day (BID) | ORAL | Status: DC
  Administered 2024-04-16 – 2024-04-17 (×4): 1 via ORAL
  Filled 2024-04-16 (×5): qty 1

## 2024-04-16 NOTE — Progress Notes (Signed)
 " PROGRESS NOTE   Garrett [name], is a 39 y.o. male, DOB - 19-Jan-1986, FMW:[id number]  Admit date - 04/14/2024   Admitting Physician Drue ONEIDA Potter, MD  Outpatient Primary MD for the patient is Gammon, Chrystal, NP  LOS - 2  Chief Complaint  Patient presents with   Abdominal Pain      Brief Narrative:  39 y.o. 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 - White blood cells within normal limit - No nausea, no vomiting and normal WBCs current appreciated.  2)Hypernatremia/Dehydration-- Na 154>>151  - Continue to maintain adequate hydration and follow electrolytes trend.  3)Hypokalemia--- -electrolytes repleted and currently stable - Normal magnesium  level appreciated.  4)Chronic Anemia--- Hgb trending down due to IVF/hemodilution -No overt bleeding appreciated.  5)DM2---A1C is 5.2 , reflection excellent diabetic control PTA Use Novolog /Humalog Sliding scale insulin  with Accu-Cheks/Fingersticks as ordered   6)Chronic hepatitis B Unsure if patient has had any treatment Continue outpatient follow-up   7)Depression Continue escitalopram    8)Failure to thrive / severe malnutrition Patient lives in a facility chronically Dietitian consult appreciated Give Glucerna  9)acute metabolic encephalopathy ---multi-factorial -partly due to #1 and # 2 above Should improve with treatment of above  Status is: Inpatient   Disposition: The patient is from: SNF              Anticipated d/c is to: SNF               Anticipated d/c date is: 2 days              Patient currently is not medically stable to d/c. Barriers: Not Clinically Stable-   Code Status : -  Code Status: Full Code   DVT Prophylaxis  :   - SCDs  enoxaparin  (LOVENOX ) injection 30 mg Start: 04/14/24 2200   Lab Results  Component Value Date   PLT 185 04/15/2024    Inpatient Medications  Scheduled Meds:  amoxicillin -clavulanate  1 tablet Oral Q12H   enoxaparin  (LOVENOX ) injection  30 mg Subcutaneous Q24H   escitalopram   10 mg Oral Daily   feeding supplement (GLUCERNA SHAKE)  237 mL Oral TID   gabapentin   100 mg Oral BID   insulin  aspart  0-9 Units Subcutaneous TID WC   tamsulosin   0.4 mg Oral Daily   Continuous Infusions:  sodium chloride  Stopped (04/14/24 2013)   PRN Meds:.acetaminophen  **OR** acetaminophen , ondansetron  (ZOFRAN ) IV   Anti-infectives (From admission, onward)    Start     Dose/Rate Route Frequency Ordered Stop   04/16/24 1400  ampicillin  (OMNIPEN) 2 g in sodium chloride  0.9 % 100 mL IVPB  Status:  Discontinued        2 g 300 mL/hr over 20 Minutes Intravenous Every 6 hours 04/16/24 1139 04/16/24 1214   04/16/24 1300  amoxicillin -clavulanate (AUGMENTIN ) 875-125 MG per tablet 1 tablet        1 tablet Oral Every 12 hours 04/16/24 1214     04/15/24 2030  ampicillin  (OMNIPEN) 2 g in sodium chloride  0.9 % 100 mL IVPB  Status:  Discontinued        2 g 300  mL/hr over 20 Minutes Intravenous Every 4 hours 04/15/24 1933 04/16/24 1139   04/15/24 2015  ampicillin  (OMNIPEN) 1 g in sodium chloride  0.9 % 100 mL IVPB  Status:  Discontinued        1 g 300 mL/hr over 20 Minutes Intravenous Every 6 hours 04/15/24 1921 04/15/24 1933   04/15/24 1800  cefTRIAXone  (ROCEPHIN ) 1 g in sodium chloride  0.9 % 100 mL IVPB  Status:  Discontinued        1 g 200 mL/hr over 30 Minutes Intravenous Every 24 hours 04/14/24 1646 04/16/24 1214   04/14/24 1600  cefTRIAXone  (ROCEPHIN ) 1 g in sodium chloride  0.9 % 100 mL IVPB        1  g 200 mL/hr over 30 Minutes Intravenous  Once 04/14/24 1559 04/14/24 1719         Subjective: Garrett [name] no fever, no chest pain, no nausea, no vomiting.  Able to follow simple commands intermittently.   Objective: Vitals:   04/15/24 2009 04/16/24 0500 04/16/24 1113 04/16/24 1306  BP: 102/78 (!) 125/90 103/83 120/83  Pulse: 86 82 86 86  Resp: 19 18 14 16   Temp: 98.3 F (36.8 C) 98.2 F (36.8 C) 97.7 F (36.5 C) (!) 97.5 F (36.4 C)  TempSrc:  Axillary Axillary   SpO2: 99% 99% 100% 99%  Weight:      Height:        Intake/Output Summary (Last 24 hours) at 04/16/2024 1820 Last data filed at 04/16/2024 0955 Gross per 24 hour  Intake 1677.55 ml  Output 1210 ml  Net 467.55 ml   Filed Weights   04/14/24 1200  Weight: 41.6 kg    Physical Exam General exam: Alert, able to follow simple commands; afebrile and in no acute distress. Respiratory system: Good saturation on room air. Cardiovascular system: No rubs, no gallops, no JVD. Gastrointestinal system: Abdomen is nondistended, soft and nontender.  Positive bowel sounds appreciated.  Suprapubic catheter in place. Central nervous system: Alert and oriented. No focal neurological deficits. Extremities: No cyanosis or clubbing. Skin: No petechiae. Psychiatry: Judgement and insight appear impaired secondary to underlying cognitive deficits.  Flat affect appreciated.  Data Reviewed: I have personally reviewed following labs and imaging studies  CBC: Recent Labs  Lab 04/14/24 1202 04/15/24 0744  WBC 14.1* 8.4  HGB 14.6 9.6*  HCT 45.5 30.1*  MCV 94.8 96.2  PLT 399 185   Basic Metabolic Panel: Recent Labs  Lab 04/14/24 1202 04/15/24 0744 04/16/24 0512  NA 154* 151* 144  K 3.9 3.1* 2.9*  CL 102 112* 106  CO2 26 31 27   GLUCOSE 179* 193* 182*  BUN 43* 22* 8  CREATININE 1.18 0.85 0.65  CALCIUM 10.4* 7.9* 7.6*  MG  --   --  2.3  PHOS  --   --  1.1*   GFR: Estimated Creatinine Clearance: 72.2 mL/min (by C-G  formula based on SCr of 0.65 mg/dL).  Liver Function Tests: Recent Labs  Lab 04/14/24 1202 04/16/24 0512  AST 25  --   ALT 25  --   ALKPHOS 78  --   BILITOT 0.7  --   PROT 10.7*  --   ALBUMIN 5.0 3.0*   HbA1C: Recent Labs    04/14/24 1202  HGBA1C 5.2   Recent Results (from the past 240 hours)  Resp panel by RT-PCR (RSV, Flu A&B, Covid) Urine, Suprapubic     Status: None   Collection Time: 04/14/24 12:26 PM   Specimen: Urine, Suprapubic;  Nasal Swab  Result Value Ref Range Status   SARS Coronavirus 2 by RT PCR NEGATIVE NEGATIVE Final    Comment: (NOTE) SARS-CoV-2 target nucleic acids are NOT DETECTED.  The SARS-CoV-2 RNA is generally detectable in upper respiratory specimens during the acute phase of infection. The lowest concentration of SARS-CoV-2 viral copies this assay can detect is 138 copies/mL. A negative result does not preclude SARS-Cov-2 infection and should not be used as the sole basis for treatment or other patient management decisions. A negative result may occur with  improper specimen collection/handling, submission of specimen other than nasopharyngeal swab, presence of viral mutation(s) within the areas targeted by this assay, and inadequate number of viral copies(<138 copies/mL). A negative result must be combined with clinical observations, patient history, and epidemiological information. The expected result is Negative.  Fact Sheet for Patients:  bloggercourse.com  Fact Sheet for Healthcare Providers:  seriousbroker.it  This test is no t yet approved or cleared by the [place]  FDA and  has been authorized for detection and/or diagnosis of SARS-CoV-2 by FDA under an Emergency Use Authorization (EUA). This EUA will remain  in effect (meaning this test can be used) for the duration of the COVID-19 declaration under Section 564(b)(1) of the Act, 21 U.S.C.section 360bbb-3(b)(1), unless the  authorization is terminated  or revoked sooner.       Influenza A by PCR NEGATIVE NEGATIVE Final   Influenza B by PCR NEGATIVE NEGATIVE Final    Comment: (NOTE) The Xpert Xpress SARS-CoV-2/FLU/RSV plus assay is intended as an aid in the diagnosis of influenza from Nasopharyngeal swab specimens and should not be used as a sole basis for treatment. Nasal washings and aspirates are unacceptable for Xpert Xpress SARS-CoV-2/FLU/RSV testing.  Fact Sheet for Patients: bloggercourse.com  Fact Sheet for Healthcare Providers: seriousbroker.it  This test is not yet approved or cleared by the [place]  FDA and has been authorized for detection and/or diagnosis of SARS-CoV-2 by FDA under an Emergency Use Authorization (EUA). This EUA will remain in effect (meaning this test can be used) for the duration of the COVID-19 declaration under Section 564(b)(1) of the Act, 21 U.S.C. section 360bbb-3(b)(1), unless the authorization is terminated or revoked.     Resp Syncytial Virus by PCR NEGATIVE NEGATIVE Final    Comment: (NOTE) Fact Sheet for Patients: bloggercourse.com  Fact Sheet for Healthcare Providers: seriousbroker.it  This test is not yet approved or cleared by the [place]  FDA and has been authorized for detection and/or diagnosis of SARS-CoV-2 by FDA under an Emergency Use Authorization (EUA). This EUA will remain in effect (meaning this test can be used) for the duration of the COVID-19 declaration under Section 564(b)(1) of the Act, 21 U.S.C. section 360bbb-3(b)(1), unless the authorization is terminated or revoked.  Performed at [facility], [address] [place], [place] [address]   Urine Culture     Status: Abnormal (Preliminary result)   Collection Time: 04/14/24  2:59 PM   Specimen: Urine, Catheterized  Result Value Ref Range Status   Specimen Description    Final    URINE, CATHETERIZED Performed at [facility], [address] [place], [place] [address]    Special Requests   Final    NONE Reflexed from [phone] Performed at [facility], [address], [place], [place] [address]    Culture (A)  Final    >=100,000 COLONIES/mL KLEBSIELLA PNEUMONIAE 30,000 COLONIES/mL ENTEROCOCCUS FAECALIS SUSCEPTIBILITIES TO FOLLOW Performed at [facility], [address], [place], [place] [address]    Report Status PENDING  Incomplete   Organism ID, Bacteria KLEBSIELLA PNEUMONIAE (A)  Final      Susceptibility   Klebsiella pneumoniae - MIC*    AMPICILLIN  RESISTANT Resistant     CEFAZOLIN (URINE) Value in next row Sensitive      2 SENSITIVEThis is a modified FDA-approved test that has been validated and its performance characteristics determined by the reporting laboratory.  This laboratory is certified under the Clinical Laboratory Improvement Amendments CLIA as qualified to perform high complexity clinical laboratory testing.    CEFEPIME Value in next row Sensitive      2 SENSITIVEThis is a modified FDA-approved test that has been validated and its performance characteristics determined by the reporting laboratory.  This laboratory is certified under the Clinical Laboratory Improvement Amendments CLIA as qualified to perform high complexity clinical laboratory testing.    ERTAPENEM Value in next row Sensitive      2 SENSITIVEThis is a modified FDA-approved test that has been validated and its performance characteristics determined by the reporting laboratory.  This laboratory is certified under the Clinical Laboratory Improvement Amendments CLIA as qualified to perform high complexity clinical laboratory testing.    CEFTRIAXONE  Value in next row Sensitive      2 SENSITIVEThis is a modified FDA-approved test that has been validated and its performance characteristics determined by the reporting laboratory.  This laboratory is certified under the  Clinical Laboratory Improvement Amendments CLIA as qualified to perform high complexity clinical laboratory testing.    CIPROFLOXACIN  Value in next row Sensitive      2 SENSITIVEThis is a modified FDA-approved test that has been validated and its performance characteristics determined by the reporting laboratory.  This laboratory is certified under the Clinical Laboratory Improvement Amendments CLIA as qualified to perform high complexity clinical laboratory testing.    GENTAMICIN Value in next row Sensitive      2 SENSITIVEThis is a modified FDA-approved test that has been validated and its performance characteristics determined by the reporting laboratory.  This laboratory is certified under the Clinical Laboratory Improvement Amendments CLIA as qualified to perform high complexity clinical laboratory testing.    NITROFURANTOIN  Value in next row Intermediate      2 SENSITIVEThis is a modified FDA-approved test that has been validated and its performance characteristics determined by the reporting laboratory.  This laboratory is certified under the Clinical Laboratory Improvement Amendments CLIA as qualified to perform high complexity clinical laboratory testing.    TRIMETH/SULFA Value in next row Sensitive      2 SENSITIVEThis is a modified FDA-approved test that has been validated and its performance characteristics determined by the reporting laboratory.  This laboratory is certified under the Clinical Laboratory Improvement Amendments CLIA as qualified to perform high complexity clinical laboratory testing.    AMPICILLIN /SULBACTAM Value in next row Sensitive      2 SENSITIVEThis is a modified FDA-approved test that has been validated and its performance characteristics determined by the reporting laboratory.  This laboratory is certified under the Clinical Laboratory Improvement Amendments CLIA as qualified to perform high complexity clinical laboratory testing.    PIP/TAZO Value in next row  Sensitive      <=4 SENSITIVEThis is a modified FDA-approved test that has been validated and its performance characteristics determined by the reporting laboratory.  This laboratory is certified under the Clinical Laboratory Improvement Amendments CLIA as qualified to perform high complexity clinical laboratory testing.    MEROPENEM  Value in  next row Sensitive      <=4 SENSITIVEThis is a modified FDA-approved test that has been validated and its performance characteristics determined by the reporting laboratory.  This laboratory is certified under the Clinical Laboratory Improvement Amendments CLIA as qualified to perform high complexity clinical laboratory testing.    * >=100,000 COLONIES/mL KLEBSIELLA PNEUMONIAE    Radiology Studies: No results found.  Scheduled Meds:  amoxicillin -clavulanate  1 tablet Oral Q12H   enoxaparin  (LOVENOX ) injection  30 mg Subcutaneous Q24H   escitalopram   10 mg Oral Daily   feeding supplement (GLUCERNA SHAKE)  237 mL Oral TID   gabapentin   100 mg Oral BID   insulin  aspart  0-9 Units Subcutaneous TID WC   tamsulosin   0.4 mg Oral Daily   Continuous Infusions:  sodium chloride  Stopped (04/14/24 2013)    LOS: 2 days   Eric Nunnery M.D on 04/16/2024 at 6:20 PM  Go to [email or web address] - for contact info  [facility] - Office  [phone]  If 7PM-7AM, please contact night-coverage [email or web address] 04/16/2024, 6:20 PM    "

## 2024-04-17 DIAGNOSIS — A419 Sepsis, unspecified organism: Secondary | ICD-10-CM | POA: Diagnosis not present

## 2024-04-17 DIAGNOSIS — N390 Urinary tract infection, site not specified: Secondary | ICD-10-CM | POA: Diagnosis not present

## 2024-04-17 LAB — GLUCOSE, CAPILLARY
Glucose-Capillary: 109 mg/dL — ABNORMAL HIGH (ref 70–99)
Glucose-Capillary: 138 mg/dL — ABNORMAL HIGH (ref 70–99)
Glucose-Capillary: 113 mg/dL — ABNORMAL HIGH (ref 70–99)
Glucose-Capillary: 103 mg/dL — ABNORMAL HIGH (ref 70–99)

## 2024-04-17 LAB — URINE CULTURE: Culture: >=100000 — AB

## 2024-04-17 MED ORDER — ADULT MULTIVITAMIN W/MINERALS CH
1.0000 | ORAL_TABLET | Freq: Every day | ORAL | Status: DC
  Administered 2024-04-17: 1 via ORAL
  Filled 2024-04-17 (×2): qty 1

## 2024-04-17 MED ORDER — SODIUM CHLORIDE 0.9 % IV BOLUS
1000.0000 mL | Freq: Once | INTRAVENOUS | Status: AC
  Administered 2024-04-17: 1000 mL via INTRAVENOUS

## 2024-04-17 MED ORDER — SACCHAROMYCES BOULARDII 250 MG PO CAPS
250.0000 mg | ORAL_CAPSULE | Freq: Two times a day (BID) | ORAL | Status: DC
  Administered 2024-04-17: 250 mg via ORAL
  Filled 2024-04-17 (×2): qty 1

## 2024-04-17 NOTE — Progress Notes (Signed)
" °   04/17/24 1336  Assess: MEWS Score  Temp 97.6 F (36.4 C)  BP (!) 71/47  MAP (mmHg) (!) 56  Pulse Rate 60  Resp 20  SpO2 99 %  O2 Device Room Air  Assess: MEWS Score  MEWS Temp 0  MEWS Systolic 2  MEWS Pulse 0  MEWS RR 0  MEWS LOC 0  MEWS Score 2  MEWS Score Color Yellow  Assess: if the MEWS score is Yellow or Red  Were vital signs accurate and taken at a resting state? Yes  Does the patient meet 2 or more of the SIRS criteria? No  MEWS guidelines implemented  Yes, yellow  Treat  MEWS Interventions Considered administering scheduled or prn medications/treatments as ordered  Take Vital Signs  Increase Vital Sign Frequency  Yellow: Q2hr x1, continue Q4hrs until patient remains green for 12hrs  Escalate  MEWS: Escalate Yellow: Discuss with charge nurse and consider notifying provider and/or RRT  Provider Notification  Provider Name/Title Ricky Fines, MD  Date Provider Notified 04/17/24  Method of Notification Page (secure chat)  Notification Reason Other (Comment) (low bp)  Provider response See new orders  Assess: SIRS CRITERIA  SIRS Temperature  0  SIRS Respirations  0  SIRS Pulse 0  SIRS WBC 0  SIRS Score Sum  0    "

## 2024-04-17 NOTE — Plan of Care (Signed)

## 2024-04-17 NOTE — Progress Notes (Signed)
 " PROGRESS NOTE   Garrett [name], is a 39 y.o. male, DOB - 07-26-1985, FMW:[id number]  Admit date - 04/14/2024   Admitting Physician Drue ONEIDA Potter, MD  Outpatient Primary MD for the patient is Gammon, Chrystal, NP  LOS - 3  Chief Complaint  Patient presents with   Abdominal Pain      Brief Narrative:  39 y.o. 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 - White blood cells within normal limit - No nausea, no vomiting and normal WBCs current appreciated.  2)Hypernatremia/Dehydration-- Na 154>>151  - Continue to maintain adequate hydration and follow electrolytes trend.  3)Hypokalemia--- -electrolytes repleted and currently stable - Normal magnesium  level appreciated. - Will continue to follow electrolytes trend.  4)Chronic Anemia--- Hgb trending down due to IVF/hemodilution -No overt bleeding appreciated.  5)DM2---A1C is 5.2 , reflection excellent diabetic control PTA Use Novolog /Humalog Sliding scale insulin  with Accu-Cheks/Fingersticks as ordered   6)Chronic hepatitis B Unsure if patient has had any treatment Continue outpatient follow-up   7)Depression -Continue escitalopram  -Flat affect appreciated on exam.   8)Failure to thrive / severe malnutrition Patient lives in a facility chronically Dietitian consult appreciated Give Glucerna  9)acute metabolic encephalopathy ---multi-factorial -partly due to #1 and # 2 above Should improve with treatment of above  10) diarrhea - Appears to  be generally most likely associated with antibiotics-Florastor-started - Maintain adequate hydration.  Status is: Inpatient   Disposition: The patient is from: SNF              Anticipated d/c is to: SNF              Anticipated d/c date is: 2 days              Patient currently is not medically stable to d/c. Barriers: Not Clinically Stable-   Code Status : -  Code Status: Full Code   DVT Prophylaxis  :   - SCDs  enoxaparin  (LOVENOX ) injection 30 mg Start: 04/14/24 2200   Lab Results  Component Value Date   PLT 185 04/15/2024    Inpatient Medications  Scheduled Meds:  amoxicillin -clavulanate  1 tablet Oral Q12H   enoxaparin  (LOVENOX ) injection  30 mg Subcutaneous Q24H   escitalopram   10 mg Oral Daily   feeding supplement (GLUCERNA SHAKE)  237 mL Oral TID   gabapentin   100 mg Oral BID   insulin  aspart  0-9 Units Subcutaneous TID WC   multivitamin with minerals  1 tablet Oral Daily   saccharomyces boulardii  250 mg Oral BID   tamsulosin   0.4 mg Oral Daily   Continuous Infusions:  sodium chloride      PRN Meds:.acetaminophen  **OR** acetaminophen , ondansetron  (ZOFRAN ) IV   Anti-infectives (From admission, onward)    Start     Dose/Rate Route Frequency Ordered Stop   04/16/24 1400  ampicillin  (OMNIPEN) 2 g in sodium chloride  0.9 % 100 mL IVPB  Status:  Discontinued        2 g 300 mL/hr over 20 Minutes Intravenous Every 6 hours 04/16/24 1139 04/16/24 1214   04/16/24 1300  amoxicillin -clavulanate (AUGMENTIN ) 875-125 MG per tablet 1 tablet  1 tablet Oral Every 12 hours 04/16/24 1214     04/15/24 2030  ampicillin  (OMNIPEN) 2 g in sodium chloride  0.9 % 100 mL IVPB  Status:  Discontinued        2 g 300 mL/hr over 20 Minutes Intravenous Every 4 hours 04/15/24 1933 04/16/24 1139   04/15/24 2015  ampicillin  (OMNIPEN) 1 g in sodium chloride  0.9 % 100 mL IVPB  Status:  Discontinued        1 g 300 mL/hr over 20 Minutes Intravenous Every 6 hours 04/15/24 1921 04/15/24 1933    04/15/24 1800  cefTRIAXone  (ROCEPHIN ) 1 g in sodium chloride  0.9 % 100 mL IVPB  Status:  Discontinued        1 g 200 mL/hr over 30 Minutes Intravenous Every 24 hours 04/14/24 1646 04/16/24 1214   04/14/24 1600  cefTRIAXone  (ROCEPHIN ) 1 g in sodium chloride  0.9 % 100 mL IVPB        1 g 200 mL/hr over 30 Minutes Intravenous  Once 04/14/24 1559 04/14/24 1719         Subjective: Garrett [name] no fever, no chest pain, no nausea or vomiting.  Demonstrating soft blood pressure and intermittent episode of diarrhea.  Chronically ill in appearance.  Still not eating much.   Objective: Vitals:   04/16/24 1900 04/17/24 0513 04/17/24 1336 04/17/24 1451  BP: 120/81 94/69 (!) 71/47 (!) 81/54  Pulse: 82 80 60 88  Resp: 17 18 20    Temp: 97.8 F (36.6 C) 98 F (36.7 C) 97.6 F (36.4 C)   TempSrc: Axillary Oral Axillary   SpO2: 99% 99% 99%   Weight:      Height:        Intake/Output Summary (Last 24 hours) at 04/17/2024 1507 Last data filed at 04/17/2024 0930 Gross per 24 hour  Intake 480 ml  Output --  Net 480 ml   Filed Weights   04/14/24 1200  Weight: 41.6 kg    Physical Exam General exam: Afebrile and in no acute distress.  Slightly more interactive on today's exam.  Chronically ill in appearance. Respiratory system: Good saturation on room air. Cardiovascular system:RRR. No murmurs, rubs, gallops. Gastrointestinal system: Abdomen is nondistended, soft and nontender.  Suprapubic catheter in place. Central nervous system: No focal neurological deficits. Extremities: No cyanosis or clubbing; muscle wasting and muscular atrophy appreciated on exam. Skin: No petechiae. Psychiatry: Judgement and insight appear impaired secondary to underlying cognitive deficits.  Flat affect appreciated on exam.  Data Reviewed: I have personally reviewed following labs and imaging studies  CBC: Recent Labs  Lab 04/14/24 1202 04/15/24 0744  WBC 14.1* 8.4  HGB 14.6 9.6*  HCT 45.5 30.1*  MCV 94.8  96.2  PLT 399 185   Basic Metabolic Panel: Recent Labs  Lab 04/14/24 1202 04/15/24 0744 04/16/24 0512  NA 154* 151* 144  K 3.9 3.1* 2.9*  CL 102 112* 106  CO2 26 31 27   GLUCOSE 179* 193* 182*  BUN 43* 22* 8  CREATININE 1.18 0.85 0.65  CALCIUM 10.4* 7.9* 7.6*  MG  --   --  2.3  PHOS  --   --  1.1*   GFR: Estimated Creatinine Clearance: 72.2 mL/min (by C-G formula based on SCr of 0.65 mg/dL).  Liver Function Tests: Recent Labs  Lab 04/14/24 1202 04/16/24 0512  AST 25  --   ALT 25  --   ALKPHOS 78  --   BILITOT 0.7  --   PROT 10.7*  --  ALBUMIN 5.0 3.0*   HbA1C: No results for input(s): HGBA1C in the last 72 hours.  Recent Results (from the past 240 hours)  Resp panel by RT-PCR (RSV, Flu A&B, Covid) Urine, Suprapubic     Status: None   Collection Time: 04/14/24 12:26 PM   Specimen: Urine, Suprapubic; Nasal Swab  Result Value Ref Range Status   SARS Coronavirus 2 by RT PCR NEGATIVE NEGATIVE Final    Comment: (NOTE) SARS-CoV-2 target nucleic acids are NOT DETECTED.  The SARS-CoV-2 RNA is generally detectable in upper respiratory specimens during the acute phase of infection. The lowest concentration of SARS-CoV-2 viral copies this assay can detect is 138 copies/mL. A negative result does not preclude SARS-Cov-2 infection and should not be used as the sole basis for treatment or other patient management decisions. A negative result may occur with  improper specimen collection/handling, submission of specimen other than nasopharyngeal swab, presence of viral mutation(s) within the areas targeted by this assay, and inadequate number of viral copies(<138 copies/mL). A negative result must be combined with clinical observations, patient history, and epidemiological information. The expected result is Negative.  Fact Sheet for Patients:  bloggercourse.com  Fact Sheet for Healthcare Providers:   seriousbroker.it  This test is no t yet approved or cleared by the [place]  FDA and  has been authorized for detection and/or diagnosis of SARS-CoV-2 by FDA under an Emergency Use Authorization (EUA). This EUA will remain  in effect (meaning this test can be used) for the duration of the COVID-19 declaration under Section 564(b)(1) of the Act, 21 U.S.C.section 360bbb-3(b)(1), unless the authorization is terminated  or revoked sooner.       Influenza A by PCR NEGATIVE NEGATIVE Final   Influenza B by PCR NEGATIVE NEGATIVE Final    Comment: (NOTE) The Xpert Xpress SARS-CoV-2/FLU/RSV plus assay is intended as an aid in the diagnosis of influenza from Nasopharyngeal swab specimens and should not be used as a sole basis for treatment. Nasal washings and aspirates are unacceptable for Xpert Xpress SARS-CoV-2/FLU/RSV testing.  Fact Sheet for Patients: bloggercourse.com  Fact Sheet for Healthcare Providers: seriousbroker.it  This test is not yet approved or cleared by the [place]  FDA and has been authorized for detection and/or diagnosis of SARS-CoV-2 by FDA under an Emergency Use Authorization (EUA). This EUA will remain in effect (meaning this test can be used) for the duration of the COVID-19 declaration under Section 564(b)(1) of the Act, 21 U.S.C. section 360bbb-3(b)(1), unless the authorization is terminated or revoked.     Resp Syncytial Virus by PCR NEGATIVE NEGATIVE Final    Comment: (NOTE) Fact Sheet for Patients: bloggercourse.com  Fact Sheet for Healthcare Providers: seriousbroker.it  This test is not yet approved or cleared by the [place]  FDA and has been authorized for detection and/or diagnosis of SARS-CoV-2 by FDA under an Emergency Use Authorization (EUA). This EUA will remain in effect (meaning this test can be used) for  the duration of the COVID-19 declaration under Section 564(b)(1) of the Act, 21 U.S.C. section 360bbb-3(b)(1), unless the authorization is terminated or revoked.  Performed at [facility], [address] [place], [place] [address]   Urine Culture     Status: Abnormal   Collection Time: 04/14/24  2:59 PM   Specimen: Urine, Catheterized  Result Value Ref Range Status   Specimen Description   Final    URINE, CATHETERIZED Performed at [facility], [address] [place], [place] [address]    Special Requests  Final    NONE Reflexed from [id number] Performed at [facility], [address] [place], [place] [address]    Culture (A)  Final    >=100,000 COLONIES/mL KLEBSIELLA PNEUMONIAE 30,000 COLONIES/mL ENTEROCOCCUS FAECALIS    Report Status 04/17/2024 FINAL  Final   Organism ID, Bacteria KLEBSIELLA PNEUMONIAE (A)  Final   Organism ID, Bacteria ENTEROCOCCUS FAECALIS (A)  Final      Susceptibility   Enterococcus faecalis - MIC*    AMPICILLIN  <=2 SENSITIVE Sensitive     NITROFURANTOIN  <=16 SENSITIVE Sensitive     VANCOMYCIN 2 SENSITIVE Sensitive     * 30,000 COLONIES/mL ENTEROCOCCUS FAECALIS   Klebsiella pneumoniae - MIC*    AMPICILLIN  RESISTANT Resistant     CEFAZOLIN (URINE) Value in next row Sensitive      2 SENSITIVEThis is a modified FDA-approved test that has been validated and its performance characteristics determined by the reporting laboratory.  This laboratory is certified under the Clinical Laboratory Improvement Amendments CLIA as qualified to perform high complexity clinical laboratory testing.    CEFEPIME Value in next row Sensitive      2 SENSITIVEThis is a modified FDA-approved test that has been validated and its performance characteristics determined by the reporting laboratory.  This laboratory is certified under the Clinical Laboratory Improvement Amendments CLIA as qualified to perform high complexity clinical laboratory testing.    ERTAPENEM Value in next  row Sensitive      2 SENSITIVEThis is a modified FDA-approved test that has been validated and its performance characteristics determined by the reporting laboratory.  This laboratory is certified under the Clinical Laboratory Improvement Amendments CLIA as qualified to perform high complexity clinical laboratory testing.    CEFTRIAXONE  Value in next row Sensitive      2 SENSITIVEThis is a modified FDA-approved test that has been validated and its performance characteristics determined by the reporting laboratory.  This laboratory is certified under the Clinical Laboratory Improvement Amendments CLIA as qualified to perform high complexity clinical laboratory testing.    CIPROFLOXACIN  Value in next row Sensitive      2 SENSITIVEThis is a modified FDA-approved test that has been validated and its performance characteristics determined by the reporting laboratory.  This laboratory is certified under the Clinical Laboratory Improvement Amendments CLIA as qualified to perform high complexity clinical laboratory testing.    GENTAMICIN Value in next row Sensitive      2 SENSITIVEThis is a modified FDA-approved test that has been validated and its performance characteristics determined by the reporting laboratory.  This laboratory is certified under the Clinical Laboratory Improvement Amendments CLIA as qualified to perform high complexity clinical laboratory testing.    NITROFURANTOIN  Value in next row Intermediate      2 SENSITIVEThis is a modified FDA-approved test that has been validated and its performance characteristics determined by the reporting laboratory.  This laboratory is certified under the Clinical Laboratory Improvement Amendments CLIA as qualified to perform high complexity clinical laboratory testing.    TRIMETH/SULFA Value in next row Sensitive      2 SENSITIVEThis is a modified FDA-approved test that has been validated and its performance characteristics determined by the reporting  laboratory.  This laboratory is certified under the Clinical Laboratory Improvement Amendments CLIA as qualified to perform high complexity clinical laboratory testing.    AMPICILLIN /SULBACTAM Value in next row Sensitive      2 SENSITIVEThis is a modified FDA-approved test that has been validated and its performance characteristics determined by the reporting laboratory.  This laboratory is certified under the Clinical Laboratory Improvement Amendments CLIA as qualified to perform high complexity clinical laboratory testing.    PIP/TAZO Value in next row Sensitive      <=4 SENSITIVEThis is a modified FDA-approved test that has been validated and its performance characteristics determined by the reporting laboratory.  This laboratory is certified under the Clinical Laboratory Improvement Amendments CLIA as qualified to perform high complexity clinical laboratory testing.    MEROPENEM  Value in next row Sensitive      <=4 SENSITIVEThis is a modified FDA-approved test that has been validated and its performance characteristics determined by the reporting laboratory.  This laboratory is certified under the Clinical Laboratory Improvement Amendments CLIA as qualified to perform high complexity clinical laboratory testing.    * >=100,000 COLONIES/mL KLEBSIELLA PNEUMONIAE    Radiology Studies: No results found.  Scheduled Meds:  amoxicillin -clavulanate  1 tablet Oral Q12H   enoxaparin  (LOVENOX ) injection  30 mg Subcutaneous Q24H   escitalopram   10 mg Oral Daily   feeding supplement (GLUCERNA SHAKE)  237 mL Oral TID   gabapentin   100 mg Oral BID   insulin  aspart  0-9 Units Subcutaneous TID WC   multivitamin with minerals  1 tablet Oral Daily   saccharomyces boulardii  250 mg Oral BID   tamsulosin   0.4 mg Oral Daily   Continuous Infusions:  sodium chloride       LOS: 3 days   Eric Nunnery M.D on 04/17/2024 at 3:07 PM  Go to [email or web address] - for contact info  [facility] - Office   [phone]  If 7PM-7AM, please contact night-coverage [email or web address] 04/17/2024, 3:07 PM    "

## 2024-04-18 ENCOUNTER — Ambulatory Visit

## 2024-04-18 LAB — GLUCOSE, CAPILLARY
Glucose-Capillary: 93 mg/dL (ref 70–99)
Glucose-Capillary: 193 mg/dL — ABNORMAL HIGH (ref 70–99)
Glucose-Capillary: 116 mg/dL — ABNORMAL HIGH (ref 70–99)

## 2024-04-18 MED ORDER — NITROFURANTOIN MONOHYD MACRO 100 MG PO CAPS: 100.0000 mg | ORAL_CAPSULE | Freq: Every day | ORAL | Status: AC

## 2024-04-18 MED ORDER — ADULT MULTIVITAMIN W/MINERALS CH: 1.0000 | ORAL_TABLET | Freq: Every day | ORAL | Status: AC

## 2024-04-18 MED ORDER — AMOXICILLIN-POT CLAVULANATE 875-125 MG PO TABS: 1.0000 | ORAL_TABLET | Freq: Two times a day (BID) | ORAL | 0 refills | Status: AC

## 2024-04-18 MED ORDER — SACCHAROMYCES BOULARDII 250 MG PO CAPS: 250.0000 mg | ORAL_CAPSULE | Freq: Two times a day (BID) | ORAL | Status: AC

## 2024-04-18 NOTE — Plan of Care (Signed)
" °  Problem: Education: Goal: Knowledge of General Education information will improve Description: Including pain rating scale, medication(s)/side effects and non-pharmacologic comfort measures Outcome: Progressing   Problem: Clinical Measurements: Goal: Ability to maintain clinical measurements within normal limits will improve Outcome: Progressing Goal: Will remain free from infection Outcome: Progressing Goal: Diagnostic test results will improve Outcome: Progressing Goal: Respiratory complications will improve Outcome: Progressing Goal: Cardiovascular complication will be avoided Outcome: Progressing   Problem: Elimination: Goal: Will not experience complications related to bowel motility Outcome: Progressing Goal: Will not experience complications related to urinary retention Outcome: Progressing   Problem: Pain Managment: Goal: General experience of comfort will improve and/or be controlled Outcome: Progressing   Problem: Safety: Goal: Ability to remain free from injury will improve Outcome: Progressing   Problem: Nutritional: Goal: Maintenance of adequate nutrition will improve Outcome: Progressing Goal: Progress toward achieving an optimal weight will improve Outcome: Progressing   "

## 2024-04-18 NOTE — Progress Notes (Signed)
 Report called to [facility] LPN at [facility]. Patient being admitted to room C10-2. Removed IV, tolerated well.

## 2024-04-18 NOTE — Discharge Summary (Signed)
 " Physician Discharge Summary   Patient: Garrett [name] MRN: [id number] DOB: 1985-06-06  Admit date:     04/14/2024  Discharge date: 04/18/24  Discharge Physician: Eric Nunnery   PCP: Wilmon Penton, NP   Recommendations at discharge:  Repeat BMET in 5 days to follow electrolytes and renal function Repeat CBC in 5 days to follow Hgb trend/stability  Discharge Diagnoses: Principal Problem:   Sepsis secondary to UTI [facility]) Active Problems:   Catheter-associated urinary tract infection   Protein-calorie malnutrition, severe  Brief Narrative:  39 y.o. male with medical history significant of chronic history of postoperative intra-abdominal abscess status post Suprapubic catheter placement, severe malnutrition, failure to thrive  admitted on 04/14/24 with Sepsis due to UTI and Hypernatremia with acute metabolic encephalopathy   Assessment and Plan: 1)Sepsis Secondary to Klebsiella and Enterococcus Faecalis UTI---CAUTI--POA -Pt has chronic indwelling catheter - Following culture results antibiotic has been transitioned to Augmentin ; will assess tolerance prior to pursue discharge. - White blood cells within normal limit - No nausea, no vomiting and normal WBCs currently appreciated.   2)Hypernatremia/Dehydration-- - Continue to maintain adequate hydration and follow electrolytes trend intermittently.   3)Hypokalemia--- -electrolytes repleted and currently stable - Normal magnesium  level appreciated. - Will continue to follow electrolytes trend.   4)Chronic Anemia- -No overt bleeding appreciated. -Repeat CBC to follow hemoglobin trend/stability.   5)DM2---A1C is 5.2 , reflection excellent diabetic control PTA -Resume prior to admission hypoglycemic regimen - Follow CBG fluctuation.   6)Chronic hepatitis B Unsure if patient has had any treatment Continue outpatient follow-up   7)Depression -Continue escitalopram  -Flat affect appreciated on exam.   8)Failure to thrive / severe  malnutrition Patient lives in a facility chronically Dietitian consult appreciated Continue feeding supplements and multivitamin as recommended.   9)acute metabolic encephalopathy ---multi-factorial -partly due to #1 and # 2 above Should improve with treatment of above   10) diarrhea - Appears to be generally most likely associated with antibiotics -Florastor-started -Continue as needed prior to admission antidiarrheal agents. - Continue to maintain adequate hydration.  11) autonomic dysfunction hypertension Shortness assess - Resume home midodrine .   Consultants: None Procedures performed: See below for x-ray reports. Disposition: Skilled nursing facility Diet recommendation: Dysphagia 3 diet with thin liquids.  DISCHARGE MEDICATION: Allergies as of 04/18/2024   No Known Allergies      Medication List     STOP taking these medications    benztropine  0.5 MG tablet Commonly known as: COGENTIN    LORazepam  0.5 MG tablet Commonly known as: ATIVAN    miconazole  nitrate Powd Commonly known as: MICATIN       TAKE these medications    Accu-Chek Guide test strip Generic drug: glucose blood Use to check blood sugars up to 4 times daily as directed.   Accu-Chek Guide w/Device Kit Use to test blood sugars up to 4 times daily as directed.   Accu-Chek Softclix Lancets lancets Use to test blood sugars up to 4 times daily as directed.   acetaminophen  325 MG tablet Commonly known as: TYLENOL  Take 2 tablets (650 mg total) by mouth every 6 (six) hours as needed for mild pain or moderate pain.   amoxicillin -clavulanate 875-125 MG tablet Commonly known as: AUGMENTIN  Take 1 tablet by mouth every 12 (twelve) hours for 5 days.   Anti-Diarrheal 2 MG tablet Generic drug: loperamide  Take 2 mg by mouth as needed for diarrhea or loose stools.   escitalopram  10 MG tablet Commonly known as: LEXAPRO  Take 1 tablet (10 mg total) by mouth daily. What  changed:  how much to  take when to take this   feeding supplement (GLUCERNA SHAKE) Liqd Take 237 mLs by mouth 3 (three) times daily between meals.   gabapentin  100 MG capsule Commonly known as: NEURONTIN  Take 1 capsule (100 mg total) by mouth 2 (two) times daily.   insulin  glargine-yfgn 100 UNIT/ML Pen Commonly known as: SEMGLEE  Inject 10 Units into the skin at bedtime.   midodrine  10 MG tablet Commonly known as: PROAMATINE  Take 1 tablet (10 mg total) by mouth 3 (three) times daily with meals.   multivitamin with minerals Tabs tablet Take 1 tablet by mouth daily. Start taking on: April 19, 2024   nitrofurantoin  (macrocrystal-monohydrate) 100 MG capsule Commonly known as: MACROBID  Take 1 capsule (100 mg total) by mouth at bedtime. Start taking on: April 23, 2024 What changed:  when to take this These instructions start on April 23, 2024. If you are unsure what to do until then, ask your doctor or other care provider.   ondansetron  4 MG disintegrating tablet Commonly known as: ZOFRAN -ODT Take 1 tablet (4 mg total) by mouth every 8 (eight) hours as needed for up to 15 doses for nausea or vomiting.   Pentips 32G X 4 MM Misc Generic drug: Insulin  Pen Needle Use to inject insulin  up to 4 times daily.   promethazine  25 MG suppository Commonly known as: PHENERGAN  Place 1 suppository (25 mg total) rectally every 8 (eight) hours as needed for refractory nausea / vomiting.   saccharomyces boulardii 250 MG capsule Commonly known as: FLORASTOR Take 1 capsule (250 mg total) by mouth 2 (two) times daily for 7 days.   tamsulosin  0.4 MG Caps capsule Commonly known as: FLOMAX  Take 1 capsule (0.4 mg total) by mouth daily. What changed: when to take this        Follow-up Information     Gammon, Chrystal, NP. Schedule an appointment as soon as possible for a visit in 10 day(s).   Specialty: Nurse Practitioner Contact information: [address] [place] [address] [phone]                 Discharge Exam: [name]   04/14/24 1200  Weight: 41.6 kg    General exam: Afebrile and in no acute distress.  Slightly more interactive on today's exam.  Chronically ill in appearance.  Tolerating diet on today's examination. Respiratory system: Good saturation on room air. Cardiovascular system:RRR. No murmurs, rubs, gallops. Gastrointestinal system: Abdomen is nondistended, soft and nontender.  Suprapubic catheter in place. Central nervous system: No focal neurological deficits. Extremities: No cyanosis or clubbing; muscle wasting and muscular atrophy appreciated on exam. Skin: No petechiae. Psychiatry: Judgement and insight appear impaired secondary to underlying cognitive deficits.  Flat affect appreciated on exam.    Condition at discharge: Stable and improved.  The results of significant diagnostics from this hospitalization (including imaging, microbiology, ancillary and laboratory) are listed below for reference.   Imaging Studies: CT ABDOMEN PELVIS W CONTRAST Result Date: 04/14/2024 CLINICAL DATA:  Abdominal pain, failure to thrive  EXAM: CT ABDOMEN AND PELVIS WITH CONTRAST TECHNIQUE: Multidetector CT imaging of the abdomen and pelvis was performed using the standard protocol following bolus administration of intravenous contrast. RADIATION DOSE REDUCTION: This exam was performed according to the departmental dose-optimization program which includes automated exposure control, adjustment of the mA and/or kV according to patient size and/or use of iterative reconstruction technique. CONTRAST:  80mL OMNIPAQUE  IOHEXOL  300 MG/ML  SOLN COMPARISON:  12/03/2022 FINDINGS: Lower chest: No  acute pleural or parenchymal lung disease. Hepatobiliary: Multiple calcified gallstones without evidence of acute cholecystitis. Liver is unremarkable. No biliary duct dilation. Pancreas: Unremarkable. No pancreatic ductal dilatation or surrounding inflammatory changes. Spleen: Normal in size  without focal abnormality. Adrenals/Urinary Tract: Indwelling suprapubic catheter again identified, with chronic bladder wall thickening. The kidneys enhance normally and symmetrically. No urinary tract calculi or obstruction. The adrenals are unremarkable. Stomach/Bowel: No bowel obstruction or ileus. The appendix is surgically absent. No bowel wall thickening or inflammatory change. Vascular/Lymphatic: Aortic atherosclerosis. No enlarged abdominal or pelvic lymph nodes. Reproductive: Prostate is unremarkable. Other: No free fluid or free intraperitoneal gas. No abdominal wall hernia. Musculoskeletal: No acute or destructive bony abnormalities. Reconstructed images demonstrate no additional findings. IMPRESSION: 1. Cholelithiasis without evidence of acute cholecystitis. 2. Stable indwelling suprapubic catheter, with chronic bladder wall thickening. 3.  Aortic Atherosclerosis (ICD10-I70.0). Electronically Signed   By: Ozell Daring M.D.   On: 04/14/2024 15:43   DG Chest Port 1 View Result Date: 04/14/2024 EXAM: 1 VIEW(S) XRAY OF THE CHEST 04/14/2024 12:24:18 PM COMPARISON: 03/03/2022 CLINICAL HISTORY: cough FINDINGS: LUNGS AND PLEURA: Lung volumes are low. Multiple air-filled loops of small and large bowel are noted within the imaged portion of the upper abdomen extending along the undersurface of the hemidiaphragms. No focal pulmonary opacity. No pleural effusion. No pneumothorax. HEART AND MEDIASTINUM: No acute abnormality of the cardiac and mediastinal silhouettes. BONES AND SOFT TISSUES: No acute osseous abnormality. IMPRESSION: 1. No acute findings. 2. Low lung volumes. Electronically signed by: Waddell Calk MD MD 04/14/2024 02:27 PM EST RP Workstation: [id number]    Microbiology: Results for orders placed or performed during the hospital encounter of 04/14/24  Resp panel by RT-PCR (RSV, Flu A&B, Covid) Urine, Suprapubic     Status: None   Collection Time: 04/14/24 12:26 PM   Specimen: Urine,  Suprapubic; Nasal Swab  Result Value Ref Range Status   SARS Coronavirus 2 by RT PCR NEGATIVE NEGATIVE Final    Comment: (NOTE) SARS-CoV-2 target nucleic acids are NOT DETECTED.  The SARS-CoV-2 RNA is generally detectable in upper respiratory specimens during the acute phase of infection. The lowest concentration of SARS-CoV-2 viral copies this assay can detect is 138 copies/mL. A negative result does not preclude SARS-Cov-2 infection and should not be used as the sole basis for treatment or other patient management decisions. A negative result may occur with  improper specimen collection/handling, submission of specimen other than nasopharyngeal swab, presence of viral mutation(s) within the areas targeted by this assay, and inadequate number of viral copies(<138 copies/mL). A negative result must be combined with clinical observations, patient history, and epidemiological information. The expected result is Negative.  Fact Sheet for Patients:  bloggercourse.com  Fact Sheet for Healthcare Providers:  seriousbroker.it  This test is no t yet approved or cleared by the [place]  FDA and  has been authorized for detection and/or diagnosis of SARS-CoV-2 by FDA under an Emergency Use Authorization (EUA). This EUA will remain  in effect (meaning this test can be used) for the duration of the COVID-19 declaration under Section 564(b)(1) of the Act, 21 U.S.C.section 360bbb-3(b)(1), unless the authorization is terminated  or revoked sooner.       Influenza A by PCR NEGATIVE NEGATIVE Final   Influenza B by PCR NEGATIVE NEGATIVE Final    Comment: (NOTE) The Xpert Xpress SARS-CoV-2/FLU/RSV plus assay is intended as an aid in the diagnosis of influenza from Nasopharyngeal swab specimens and should not be used as a sole  basis for treatment. Nasal washings and aspirates are unacceptable for Xpert Xpress  SARS-CoV-2/FLU/RSV testing.  Fact Sheet for Patients: bloggercourse.com  Fact Sheet for Healthcare Providers: seriousbroker.it  This test is not yet approved or cleared by the [place]  FDA and has been authorized for detection and/or diagnosis of SARS-CoV-2 by FDA under an Emergency Use Authorization (EUA). This EUA will remain in effect (meaning this test can be used) for the duration of the COVID-19 declaration under Section 564(b)(1) of the Act, 21 U.S.C. section 360bbb-3(b)(1), unless the authorization is terminated or revoked.     Resp Syncytial Virus by PCR NEGATIVE NEGATIVE Final    Comment: (NOTE) Fact Sheet for Patients: bloggercourse.com  Fact Sheet for Healthcare Providers: seriousbroker.it  This test is not yet approved or cleared by the [place]  FDA and has been authorized for detection and/or diagnosis of SARS-CoV-2 by FDA under an Emergency Use Authorization (EUA). This EUA will remain in effect (meaning this test can be used) for the duration of the COVID-19 declaration under Section 564(b)(1) of the Act, 21 U.S.C. section 360bbb-3(b)(1), unless the authorization is terminated or revoked.  Performed at [facility], [address], [place], [place] [address]   Urine Culture     Status: Abnormal   Collection Time: 04/14/24  2:59 PM   Specimen: Urine, Catheterized  Result Value Ref Range Status   Specimen Description   Final    URINE, CATHETERIZED Performed at [facility], [address], [place], [place] [address]    Special Requests   Final    NONE Reflexed from [id number] Performed at [facility], [address] [place], [place] [address]    Culture (A)  Final    >=100,000 COLONIES/mL KLEBSIELLA PNEUMONIAE 30,000 COLONIES/mL ENTEROCOCCUS FAECALIS    Report Status 04/17/2024 FINAL  Final   Organism ID, Bacteria KLEBSIELLA PNEUMONIAE (A)   Final   Organism ID, Bacteria ENTEROCOCCUS FAECALIS (A)  Final      Susceptibility   Enterococcus faecalis - MIC*    AMPICILLIN  <=2 SENSITIVE Sensitive     NITROFURANTOIN  <=16 SENSITIVE Sensitive     VANCOMYCIN 2 SENSITIVE Sensitive     * 30,000 COLONIES/mL ENTEROCOCCUS FAECALIS   Klebsiella pneumoniae - MIC*    AMPICILLIN  RESISTANT Resistant     CEFAZOLIN (URINE) Value in next row Sensitive      2 SENSITIVEThis is a modified FDA-approved test that has been validated and its performance characteristics determined by the reporting laboratory.  This laboratory is certified under the Clinical Laboratory Improvement Amendments CLIA as qualified to perform high complexity clinical laboratory testing.    CEFEPIME Value in next row Sensitive      2 SENSITIVEThis is a modified FDA-approved test that has been validated and its performance characteristics determined by the reporting laboratory.  This laboratory is certified under the Clinical Laboratory Improvement Amendments CLIA as qualified to perform high complexity clinical laboratory testing.    ERTAPENEM Value in next row Sensitive      2 SENSITIVEThis is a modified FDA-approved test that has been validated and its performance characteristics determined by the reporting laboratory.  This laboratory is certified under the Clinical Laboratory Improvement Amendments CLIA as qualified to perform high complexity clinical laboratory testing.    CEFTRIAXONE  Value in next row Sensitive      2 SENSITIVEThis is a modified FDA-approved test that has been validated and its performance characteristics determined by the reporting laboratory.  This laboratory is certified under the Clinical Laboratory Improvement  Amendments CLIA as qualified to perform high complexity clinical laboratory testing.    CIPROFLOXACIN  Value in next row Sensitive      2 SENSITIVEThis is a modified FDA-approved test that has been validated and its performance characteristics determined by  the reporting laboratory.  This laboratory is certified under the Clinical Laboratory Improvement Amendments CLIA as qualified to perform high complexity clinical laboratory testing.    GENTAMICIN Value in next row Sensitive      2 SENSITIVEThis is a modified FDA-approved test that has been validated and its performance characteristics determined by the reporting laboratory.  This laboratory is certified under the Clinical Laboratory Improvement Amendments CLIA as qualified to perform high complexity clinical laboratory testing.    NITROFURANTOIN  Value in next row Intermediate      2 SENSITIVEThis is a modified FDA-approved test that has been validated and its performance characteristics determined by the reporting laboratory.  This laboratory is certified under the Clinical Laboratory Improvement Amendments CLIA as qualified to perform high complexity clinical laboratory testing.    TRIMETH/SULFA Value in next row Sensitive      2 SENSITIVEThis is a modified FDA-approved test that has been validated and its performance characteristics determined by the reporting laboratory.  This laboratory is certified under the Clinical Laboratory Improvement Amendments CLIA as qualified to perform high complexity clinical laboratory testing.    AMPICILLIN /SULBACTAM Value in next row Sensitive      2 SENSITIVEThis is a modified FDA-approved test that has been validated and its performance characteristics determined by the reporting laboratory.  This laboratory is certified under the Clinical Laboratory Improvement Amendments CLIA as qualified to perform high complexity clinical laboratory testing.    PIP/TAZO Value in next row Sensitive      <=4 SENSITIVEThis is a modified FDA-approved test that has been validated and its performance characteristics determined by the reporting laboratory.  This laboratory is certified under the Clinical Laboratory Improvement Amendments CLIA as qualified to perform high complexity  clinical laboratory testing.    MEROPENEM  Value in next row Sensitive      <=4 SENSITIVEThis is a modified FDA-approved test that has been validated and its performance characteristics determined by the reporting laboratory.  This laboratory is certified under the Clinical Laboratory Improvement Amendments CLIA as qualified to perform high complexity clinical laboratory testing.    * >=100,000 COLONIES/mL KLEBSIELLA PNEUMONIAE    Labs: CBC: Recent Labs  Lab 04/14/24 1202 04/15/24 0744  WBC 14.1* 8.4  HGB 14.6 9.6*  HCT 45.5 30.1*  MCV 94.8 96.2  PLT 399 185   Basic Metabolic Panel: Recent Labs  Lab 04/14/24 1202 04/15/24 0744 04/16/24 0512  NA 154* 151* 144  K 3.9 3.1* 2.9*  CL 102 112* 106  CO2 26 31 27   GLUCOSE 179* 193* 182*  BUN 43* 22* 8  CREATININE 1.18 0.85 0.65  CALCIUM 10.4* 7.9* 7.6*  MG  --   --  2.3  PHOS  --   --  1.1*   Liver Function Tests: Recent Labs  Lab 04/14/24 1202 04/16/24 0512  AST 25  --   ALT 25  --   ALKPHOS 78  --   BILITOT 0.7  --   PROT 10.7*  --   ALBUMIN 5.0 3.0*   CBG: Recent Labs  Lab 04/17/24 1113 04/17/24 1633 04/17/24 1932 04/18/24 0728 04/18/24 1113  GLUCAP 138* 113* 103* 93 193*    Discharge time spent:  35 minutes.  Signed: Eric Nunnery, MD [facility] 04/18/2024 "

## 2024-04-18 NOTE — Plan of Care (Signed)

## 2024-04-18 NOTE — TOC Transition Note (Signed)
 Transition of Care [facility]) - Discharge Note   Patient Details  Name: Garrett [name] MRN: [id number] Date of Birth: 1986/03/03  [facility] of [facility]) CM/SW Contact:  Lucie Lunger, LCSWA Phone Number: 04/18/2024, 2:54 PM  Clinical Narrative:    CSW updated that is medically stable for D/C back to [facility]. CSW spoke to Debbie in admissions who confirms they can accept pt today. CSW sent D/C clinicals to facility via HUB. CSW spoke with pts legal guardian to provide update on D/C. Room and report numbers provided to RN. Med necessity completed and sent to the floor for review. EMS to be called for transport. TOC signing off.   Final next level of care: Long Term Nursing Home Barriers to Discharge: Barriers Resolved   Patient Goals and CMS Choice Patient states their goals for this hospitalization and ongoing recovery are:: retun to LTC CMS Medicare.gov Compare Post Acute Care list provided to:: Legal Guardian Choice offered to / list presented to : [facility] POA / Guardian      Discharge Placement                Patient to be transferred to facility by: EMS Name of family member notified: Guardian Patient and family notified of of transfer: 04/18/24  Discharge Plan and Services Additional resources added to the After Visit Summary for   In-house Referral: Clinical Social Work Discharge Planning Services: CM Consult Post Acute Care Choice: Nursing Home                               Social Drivers of Health (SDOH) Interventions SDOH Screenings   Food Insecurity: Patient Unable To Answer (04/14/2024)  Housing: Patient Unable To Answer (04/14/2024)  Transportation Needs: Patient Unable To Answer (04/14/2024)  Utilities: Patient Unable To Answer (04/14/2024)  Depression (PHQ2-9): Low Risk (09/26/2021)  Financial Resource Strain: Low Risk (01/17/2022)   [facility]  Tobacco Use: Medium Risk (04/14/2024)     Readmission Risk Interventions     No data  to display

## 2024-04-18 NOTE — Progress Notes (Signed)
 Patient refused all of his morning meds. Informed provider via secure chat.

## 2024-05-19 ENCOUNTER — Ambulatory Visit
